# Patient Record
Sex: Female | Born: 1935 | Race: White | Hispanic: No | Marital: Married | Smoking: Former smoker
Health system: Southern US, Community
[De-identification: ages and names within clinical notes are randomized; demographics above are authoritative.]

## PROBLEM LIST (undated history)

## (undated) ENCOUNTER — Emergency Department (HOSPITAL_COMMUNITY): Disposition: A | Discharge status: Home / Self Care | Payer: Self-pay

## (undated) DIAGNOSIS — E039 Hypothyroidism, unspecified: Secondary | ICD-10-CM

## (undated) DIAGNOSIS — Z9289 Personal history of other medical treatment: Secondary | ICD-10-CM

## (undated) DIAGNOSIS — I82409 Acute embolism and thrombosis of unspecified deep veins of unspecified lower extremity: Secondary | ICD-10-CM

## (undated) DIAGNOSIS — R42 Dizziness and giddiness: Secondary | ICD-10-CM

## (undated) DIAGNOSIS — M1990 Unspecified osteoarthritis, unspecified site: Secondary | ICD-10-CM

## (undated) DIAGNOSIS — H02403 Unspecified ptosis of bilateral eyelids: Secondary | ICD-10-CM

## (undated) DIAGNOSIS — Z9889 Other specified postprocedural states: Secondary | ICD-10-CM

## (undated) DIAGNOSIS — K219 Gastro-esophageal reflux disease without esophagitis: Secondary | ICD-10-CM

## (undated) DIAGNOSIS — R112 Nausea with vomiting, unspecified: Secondary | ICD-10-CM

## (undated) DIAGNOSIS — Z973 Presence of spectacles and contact lenses: Secondary | ICD-10-CM

## (undated) HISTORY — PX: SHOULDER SURGERY: SHX246

## (undated) HISTORY — PX: EYE SURGERY: SHX253

## (undated) HISTORY — PX: TONSILLECTOMY: SUR1361

## (undated) HISTORY — PX: KNEE SURGERY: SHX244

## (undated) HISTORY — PX: COLONOSCOPY: SHX174

## (undated) SURGERY — BLEPHAROPLASTY
Anesthesia: IV Sedation (MBSC Only) | Laterality: Bilateral

---

## 1984-04-04 HISTORY — PX: BACK SURGERY: SHX140

## 1984-04-04 HISTORY — PX: ABDOMINAL HYSTERECTOMY: SHX81

## 1997-09-24 ENCOUNTER — Ambulatory Visit (HOSPITAL_COMMUNITY): Admission: RE | Admit: 1997-09-24 | Discharge: 1997-09-24 | Payer: Self-pay | Admitting: Internal Medicine

## 1998-03-03 ENCOUNTER — Ambulatory Visit (HOSPITAL_COMMUNITY): Admission: RE | Admit: 1998-03-03 | Discharge: 1998-03-03 | Payer: Self-pay | Admitting: Internal Medicine

## 1998-04-04 HISTORY — PX: CARPAL TUNNEL RELEASE: SHX101

## 1999-02-17 ENCOUNTER — Ambulatory Visit (HOSPITAL_BASED_OUTPATIENT_CLINIC_OR_DEPARTMENT_OTHER): Admission: RE | Admit: 1999-02-17 | Discharge: 1999-02-17 | Payer: Self-pay | Admitting: Orthopedic Surgery

## 1999-03-09 ENCOUNTER — Encounter: Payer: Self-pay | Admitting: Internal Medicine

## 1999-03-09 ENCOUNTER — Ambulatory Visit (HOSPITAL_COMMUNITY): Admission: RE | Admit: 1999-03-09 | Discharge: 1999-03-09 | Payer: Self-pay | Admitting: Internal Medicine

## 2000-03-20 ENCOUNTER — Encounter: Payer: Self-pay | Admitting: Internal Medicine

## 2000-03-20 ENCOUNTER — Ambulatory Visit (HOSPITAL_COMMUNITY): Admission: RE | Admit: 2000-03-20 | Discharge: 2000-03-20 | Payer: Self-pay | Admitting: Internal Medicine

## 2000-06-28 ENCOUNTER — Encounter: Admission: RE | Admit: 2000-06-28 | Discharge: 2000-06-28 | Payer: Self-pay | Admitting: Internal Medicine

## 2000-06-28 ENCOUNTER — Encounter: Payer: Self-pay | Admitting: Internal Medicine

## 2000-08-11 ENCOUNTER — Encounter: Admission: RE | Admit: 2000-08-11 | Discharge: 2000-08-11 | Payer: Self-pay | Admitting: Internal Medicine

## 2000-08-11 ENCOUNTER — Encounter: Payer: Self-pay | Admitting: Internal Medicine

## 2000-08-24 ENCOUNTER — Ambulatory Visit (HOSPITAL_COMMUNITY): Admission: RE | Admit: 2000-08-24 | Discharge: 2000-08-24 | Payer: Self-pay | Admitting: Internal Medicine

## 2000-09-13 ENCOUNTER — Ambulatory Visit (HOSPITAL_COMMUNITY): Admission: RE | Admit: 2000-09-13 | Discharge: 2000-09-13 | Payer: Self-pay | Admitting: Gastroenterology

## 2001-01-05 ENCOUNTER — Encounter: Payer: Self-pay | Admitting: Internal Medicine

## 2001-01-05 ENCOUNTER — Encounter: Admission: RE | Admit: 2001-01-05 | Discharge: 2001-01-05 | Payer: Self-pay | Admitting: Internal Medicine

## 2001-03-22 ENCOUNTER — Encounter: Payer: Self-pay | Admitting: Internal Medicine

## 2001-03-22 ENCOUNTER — Ambulatory Visit (HOSPITAL_COMMUNITY): Admission: RE | Admit: 2001-03-22 | Discharge: 2001-03-22 | Payer: Self-pay | Admitting: Internal Medicine

## 2002-03-25 ENCOUNTER — Ambulatory Visit (HOSPITAL_COMMUNITY): Admission: RE | Admit: 2002-03-25 | Discharge: 2002-03-25 | Payer: Self-pay | Admitting: Internal Medicine

## 2002-03-25 ENCOUNTER — Encounter: Payer: Self-pay | Admitting: Internal Medicine

## 2003-03-27 ENCOUNTER — Encounter: Admission: RE | Admit: 2003-03-27 | Discharge: 2003-03-27 | Payer: Self-pay | Admitting: Internal Medicine

## 2004-04-16 ENCOUNTER — Encounter: Admission: RE | Admit: 2004-04-16 | Discharge: 2004-04-16 | Payer: Self-pay | Admitting: Internal Medicine

## 2005-04-04 HISTORY — PX: CYSTOCELE REPAIR: SHX163

## 2005-04-19 ENCOUNTER — Encounter: Admission: RE | Admit: 2005-04-19 | Discharge: 2005-04-19 | Payer: Self-pay | Admitting: Internal Medicine

## 2005-07-22 ENCOUNTER — Ambulatory Visit (HOSPITAL_BASED_OUTPATIENT_CLINIC_OR_DEPARTMENT_OTHER): Admission: RE | Admit: 2005-07-22 | Discharge: 2005-07-22 | Payer: Self-pay | Admitting: Urology

## 2005-08-30 ENCOUNTER — Encounter: Admission: RE | Admit: 2005-08-30 | Discharge: 2005-08-30 | Payer: Self-pay | Admitting: Orthopedic Surgery

## 2005-09-01 ENCOUNTER — Encounter: Admission: RE | Admit: 2005-09-01 | Discharge: 2005-09-01 | Payer: Self-pay | Admitting: Internal Medicine

## 2005-10-07 ENCOUNTER — Encounter: Admission: RE | Admit: 2005-10-07 | Discharge: 2005-10-07 | Payer: Self-pay | Admitting: Orthopedic Surgery

## 2005-11-04 ENCOUNTER — Encounter: Admission: RE | Admit: 2005-11-04 | Discharge: 2005-11-04 | Payer: Self-pay | Admitting: Orthopedic Surgery

## 2005-12-07 ENCOUNTER — Ambulatory Visit (HOSPITAL_COMMUNITY): Admission: RE | Admit: 2005-12-07 | Discharge: 2005-12-07 | Payer: Self-pay | Admitting: Gastroenterology

## 2006-04-27 ENCOUNTER — Encounter: Admission: RE | Admit: 2006-04-27 | Discharge: 2006-04-27 | Payer: Self-pay | Admitting: Internal Medicine

## 2006-09-01 ENCOUNTER — Encounter: Admission: RE | Admit: 2006-09-01 | Discharge: 2006-09-01 | Payer: Self-pay | Admitting: Internal Medicine

## 2007-04-30 ENCOUNTER — Encounter: Admission: RE | Admit: 2007-04-30 | Discharge: 2007-04-30 | Payer: Self-pay | Admitting: Internal Medicine

## 2007-12-27 ENCOUNTER — Encounter: Admission: RE | Admit: 2007-12-27 | Discharge: 2007-12-27 | Payer: Self-pay | Admitting: Internal Medicine

## 2008-04-30 ENCOUNTER — Encounter: Admission: RE | Admit: 2008-04-30 | Discharge: 2008-04-30 | Payer: Self-pay | Admitting: Internal Medicine

## 2009-04-04 HISTORY — PX: BREAST BIOPSY: SHX20

## 2009-05-05 ENCOUNTER — Encounter: Admission: RE | Admit: 2009-05-05 | Discharge: 2009-05-05 | Payer: Self-pay | Admitting: Internal Medicine

## 2009-05-12 ENCOUNTER — Encounter: Admission: RE | Admit: 2009-05-12 | Discharge: 2009-05-12 | Payer: Self-pay | Admitting: Internal Medicine

## 2009-05-26 ENCOUNTER — Encounter: Admission: RE | Admit: 2009-05-26 | Discharge: 2009-05-26 | Payer: Self-pay | Admitting: Internal Medicine

## 2009-11-06 ENCOUNTER — Encounter: Admission: RE | Admit: 2009-11-06 | Discharge: 2009-11-06 | Payer: Self-pay | Admitting: Orthopedic Surgery

## 2010-04-04 HISTORY — PX: CARPAL TUNNEL RELEASE: SHX101

## 2010-04-24 ENCOUNTER — Other Ambulatory Visit: Payer: Self-pay | Admitting: Internal Medicine

## 2010-04-24 DIAGNOSIS — Z1239 Encounter for other screening for malignant neoplasm of breast: Secondary | ICD-10-CM

## 2010-04-24 DIAGNOSIS — Z1231 Encounter for screening mammogram for malignant neoplasm of breast: Secondary | ICD-10-CM

## 2010-05-12 ENCOUNTER — Ambulatory Visit
Admission: RE | Admit: 2010-05-12 | Discharge: 2010-05-12 | Disposition: A | Discharge status: Home / Self Care | Payer: MEDICARE | Source: Ambulatory Visit | Attending: Internal Medicine | Admitting: Internal Medicine

## 2010-05-12 DIAGNOSIS — Z1231 Encounter for screening mammogram for malignant neoplasm of breast: Secondary | ICD-10-CM

## 2010-06-18 ENCOUNTER — Inpatient Hospital Stay (INDEPENDENT_AMBULATORY_CARE_PROVIDER_SITE_OTHER)
Admission: RE | Admit: 2010-06-18 | Discharge: 2010-06-18 | Disposition: A | Discharge status: Home / Self Care | Payer: MEDICARE | Source: Ambulatory Visit | Attending: Family Medicine | Admitting: Family Medicine

## 2010-06-18 ENCOUNTER — Ambulatory Visit (INDEPENDENT_AMBULATORY_CARE_PROVIDER_SITE_OTHER): Payer: MEDICARE

## 2010-06-18 DIAGNOSIS — IMO0002 Reserved for concepts with insufficient information to code with codable children: Secondary | ICD-10-CM

## 2010-06-18 DIAGNOSIS — S63509A Unspecified sprain of unspecified wrist, initial encounter: Secondary | ICD-10-CM
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
## 2010-08-20 NOTE — Op Note (Signed)
[place]. [facility]  Patient:    EVIE CROSTON                   MRN: [id number] Proc. Date: 02/17/99 Adm. Date:  [id number] Attending:  Ronne Binning CC:         Nicki Reaper, M.D. (2 copies)                           Operative Report  PREOPERATIVE DIAGNOSIS:  Carpal tunnel syndrome right hand.  Stenosing tenosynovitis right middle finger.  POSTOPERATIVE DIAGNOSIS:  Carpal tunnel syndrome right hand.  Stenosing tenosynovitis right middle finger.  OPERATION:  Carpal tunnel release, right hand.  Release stenosing tenosynovitis  right middle finger.  SURGEON:  Nicki Reaper, M.D.  ASSISTANT:  R.N.  ANESTHESIA:  Forearm-based IV regional.  ANESTHESIOLOGIST:  Maren Beach, M.D.  HISTORY:  The patient is an 77 year old female with a history of carpal tunnel syndrome.  EMG nerve conduction is positive, which has not responded to conservative treatment.  She is also having triggering of her right middle finger, which has not responded to conservative treatment and desires having this released also.  PROCEDURE:  The patient was brought to the operating room, where a forearm-based IV regional anesthetic was carried out without difficulty.  She was prepped and draped using Betadine scrubbing solution with the right arm free.  A longitudinal incision was made in the palm and carried down through subcutaneous tissue.  Bleeders were electrocauterized.  The palmar fascia was split.  The superficial palmar arch identified.  The flexor tendon to the ring and little finger were identified to the ulnar side of the median nerve.  The carpal retinaculum was incised with sharp dissection.  A right-angle and Sewall retractor were placed between skin and forearm fascia.  The fascia was released for approximately 3 cm proximal to the  wrist crease under direct vision.  The canal was explored and no further lesions were identified.  Motor branch  was noted to enter in her muscle.  The wound was  irrigated.  The skin was closed with interrupted 5 0 nylon sutures.  A separate  incision was then made over the A1 pulley of the middle finger, right hand, and  carried down through subcutaneous tissue.  Bleeders again were electrocauterized. Neurovascular structures identified and protected.  The dissection carried down to the A1 pulley, which was found to be thickened.  There was a moderate tenosynovitis.  A release was then performed on the radial aspect of the A1 pulley. A small incision was made in the A2 pulley.  The finger was placed through a full range of motion.  No further triggering was identified.  The wound was irrigated. The skin was closed with interrupted 5 0 nylon sutures.  A sterile compressive dressing and splint were applied.  The patient tolerated the procedure well and was taken to the recovery room for observation in satisfactory condition.  She is discharged home to return to the [facility] [place] in one week on Talwin NX and Septra DS. DD:  02/17/99 TD:  02/17/99 Job: [id number]

## 2010-08-20 NOTE — Op Note (Signed)
[name], [name]           ACCOUNT NO.:  [id number]   MEDICAL RECORD NO.:  [id number]          PATIENT TYPE:  AMB   LOCATION:  ENDO                         FACILITY:  [facility]   PHYSICIAN:  Anselmo Rod, M.D.  DATE OF BIRTH:  1932/11/06   DATE OF PROCEDURE:  12/07/2005  DATE OF DISCHARGE:                                 OPERATIVE REPORT   PROCEDURE PERFORMED:  Screening colonoscopy.   ENDOSCOPIST:  Anselmo Rod, M.D.   INSTRUMENT USED:  Olympus video colonoscope.   INDICATION FOR PROCEDURE:  A 77 year old white female undergoing a screening  colonoscopy due to the family history of colon cancer in her father and to  rule out colonic polyps, masses, etc.   PREPROCEDURE PREPARATION:  Informed consent was procured from the patient.  The patient was fasted for 4 hours prior to the procedure and prepped with  32 Osmoprep pills, 20 of which were given the night prior to the procedure  and 12 the morning of the procedure.  Risks and benefits of the procedure  including a 10% miss rate of cancer in polyps were discussed with the  patient previously.   PREPROCEDURE PHYSICAL:  VITAL SIGNS:  The patient had stable vital signs.  NECK:  Supple.  CHEST:  Clear to auscultation.  CARDIAC:  S1 and S2 regular.  ABDOMEN:  Soft with normal bowel sounds.   DESCRIPTION OF THE PROCEDURE:  The patient was placed in the left lateral  decubitus position and sedated with an additional 75 mcg of fentanyl and 6  mg of Versed in slow incremental doses.  Once the patient was adequately  sedate and maintained on low-flow oxygen and continuous cardiac monitoring,  the Olympus video colonoscope was advanced from the rectum to the cecum.  The appendiceal orifice and ileocecal valve were clearly visualized and  photographed.  Multiple washes were done.  No masses, polyps, erosions,  ulcerations or diverticula were seen.  Small internal hemorrhoids were  present on retroflexion in the rectum.  The  patient tolerated the procedure  well without complication.   IMPRESSION:  1.Normal colonoscopy with no masses, polyps, erosions or  diverticulosis seen.  2.Small internal hemorrhoids seen on retroflexion in the rectum.   RECOMMENDATIONS:  1.Continue a high-fiber diet with liberal fluid intake.  2.Repeat colonoscopy in the next 5 years, unless the patient develops any  abnormal symptoms in the interim, in which case he is to return back to the  office to see me immediately.  3.Outpatient followup as need arises in the future.      Anselmo Rod, M.D.  Electronically Signed     JNM/MEDQ  D:  12/07/2005  T:  12/07/2005  Job:  [id number]   cc:   Antony Madura, M.D.

## 2010-08-20 NOTE — Procedures (Signed)
[place]. [facility]  Patient:    [name], [name]                  MRN: [id number] Proc. Date: 09/13/00 Adm. Date:  [id number] Attending:  Charna Elizabeth                           Procedure Report  DATE OF BIRTH:  [birth year]  REFERRING PHYSICIAN:  PROCEDURE PERFORMED:  Colonoscopy.  ENDOSCOPIST:  Anselmo Rod, M.D.  INSTRUMENT USED:  Olympus video colonoscope.  INDICATIONS FOR PROCEDURE:  Personal history of polyps in a  77 year old white female, rule out recurrent polyps.  PREPROCEDURE PREPARATION:  Informed consent was procured from the patient. The patient was fasted for eight hours prior to the procedure and prepped with a bottle of magnesium citrate and a gallon of NuLytely the night prior to the procedure.  PREPROCEDURE PHYSICAL:  The patient had stable vital signs.  Neck supple. Chest clear to auscultation.  S1, S2 regular.  Abdomen soft with normal abdominal bowel sounds.  DESCRIPTION OF PROCEDURE:  The patient was placed in the left lateral decubitus position and sedated with 60 mcg of fentanyl and 7 mg of Versed intravenously.  Once the patient was adequately sedated and maintained on low-flow oxygen and continuous cardiac monitoring, the Olympus video colonoscope was advanced from the rectum to the cecum with difficulty secondary to a large amount of residual stool in the colon.  No masses, polyps, erosions or ulcerations were seen.  Small internal hemorrhoids were appreciated on retroflexion in the rectum.  The patient tolerated the procedure well without complication.  IMPRESSION: 1. Large amount of stool in the colon. 2. Small internal hemorrhoids seen. 3. No masses or polyps present. 4. Very small lesions could have been missed.  RECOMMENDATIONS: 1. Repeat colorectal cancer screening is recommended in the next five years    unless the patient were to develop any abnormal symptoms in the  interim.DD: 09/13/00 TD:  09/13/00 Job: [id number]

## 2010-12-17 ENCOUNTER — Ambulatory Visit (HOSPITAL_BASED_OUTPATIENT_CLINIC_OR_DEPARTMENT_OTHER)
Admission: RE | Admit: 2010-12-17 | Discharge: 2010-12-17 | Disposition: A | Discharge status: Home / Self Care | Payer: Medicare Other | Source: Ambulatory Visit | Attending: Orthopedic Surgery | Admitting: Orthopedic Surgery

## 2010-12-17 DIAGNOSIS — G5600 Carpal tunnel syndrome, unspecified upper limb: Secondary | ICD-10-CM | POA: Insufficient documentation

## 2010-12-17 DIAGNOSIS — K219 Gastro-esophageal reflux disease without esophagitis: Secondary | ICD-10-CM | POA: Insufficient documentation

## 2010-12-17 DIAGNOSIS — Z01812 Encounter for preprocedural laboratory examination: Secondary | ICD-10-CM | POA: Insufficient documentation

## 2010-12-17 DIAGNOSIS — M65839 Other synovitis and tenosynovitis, unspecified forearm: Secondary | ICD-10-CM | POA: Insufficient documentation

## 2010-12-17 LAB — POCT HEMOGLOBIN-HEMACUE: Hemoglobin: 12.2 g/dL (ref 12.0–15.0)

## 2010-12-23 NOTE — Op Note (Signed)
NAMEALANNIE, AMODIO           ACCOUNT NO.:  [id number]  MEDICAL RECORD NO.:  [id number]  LOCATION:                                 FACILITY:  PHYSICIAN:  Katy Fitch. Anthea Udovich, M.D. DATE OF BIRTH:  [birth year]  DATE OF PROCEDURE:  12/17/2010 DATE OF DISCHARGE:                              OPERATIVE REPORT   PREOPERATIVE DIAGNOSES: 1. Chronic left carpal tunnel syndrome. 2. 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  POSTOPERATIVE DIAGNOSES: 1. Chronic left carpal tunnel syndrome. 2. 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  OPERATION: 1. Release of left transverse carpal ligament. 2. Release of left ring finger A1 pulley.  OPERATING SURGEON:  Katy Fitch. Dandria Griego, MD  ASSISTANT:  Marveen Reeks Dasnoit, PA-C  ANESTHESIA:  General by LMA.  SUPERVISING ANESTHESIOLOGIST:  Zenon Mayo, MD  INDICATIONS:  [name] is a 77 year old woman referred through the courtesy of Dr. Burton Apley for evaluation and management of hand symptoms.  Clinical examination revealed signs of carpal tunnel syndrome and stenosing tenosynovitis of the left ring finger.  Due to a failure to respond to nonoperative measures, she is now brought to the operating room anticipating release of her left transverse carpal ligament and release of her left ring finger A1 pulley under general by LMA anesthesia.  Questions regarding the anticipated procedures were invited and answered in detail.  PROCEDURE:  Ciarah Peace was brought to room 1 of the [facility] and placed in supine position upon the operating table.  Following induction of general anesthesia by LMA technique under Dr. Jarrett Ables direct supervision, the left arm was prepped with Betadine soap and solution and sterilely draped.  A pneumatic tourniquet was applied to the proximal left brachium.  Following exsanguination of the left arm with an Esmarch bandage, the arterial  tourniquet was inflated to 230 mmHg.  Procedure commenced with routine surgical time-out.  A short incision was then fashioned in the line of the ring finger and the palm.  Subcutaneous tissues were carefully divided revealing the palmar fascia.  This was split longitudinally to reveal the common sensory branches of the median nerve.  These were followed back to the transverse carpal ligament where the median nerve was gently isolated from the transverse carpal ligament with a Penfield 4 Engineer, structural.  Once the pathway was cleared superficial and deep to the ligament, the ligament was released with scissors along its ulnar border.  This widely opened the carpal canal.  Bleeding points along the margin of the released ligament were electrocauterized with bipolar current, followed by repair of the skin with intradermal 3-0 Prolene suture.  Attention was then directed to the left ring finger flexor sheath.  The palpably thickened A1 pulley was identified.  A short oblique incision was fashioned directly over the pulley.  The palmar fascia was released, followed by isolation of the pulley.  The pulley was split with scalpel and scissors.  The tendons were otherwise unimpeded.  Free range of motion of the fingers was recovered.  The palm wound was then repaired with intradermal 3-0 Prolene suture.  Lidocaine 2% was then infiltrated around the median nerve in the distal forearm and along the margins  of the wound for postoperative analgesia.  For aftercare, Ms. Virrueta is provided prescriptions for Vicodin 5 mg one p.o. q.4-6 h. p.r.n. pain, 20 tablets without refill.     Katy Fitch Safiyah Cisney, M.D.     RVS/MEDQ  D:  12/17/2010  T:  12/17/2010  Job:  [id number]  cc:   Antony Madura, M.D.  Electronically Signed by Josephine Igo M.D. on 12/23/2010 11:39:15 AM

## 2011-04-20 ENCOUNTER — Other Ambulatory Visit: Payer: Self-pay | Admitting: Internal Medicine

## 2011-04-20 DIAGNOSIS — Z1231 Encounter for screening mammogram for malignant neoplasm of breast: Secondary | ICD-10-CM

## 2011-05-11 ENCOUNTER — Other Ambulatory Visit: Payer: Self-pay | Admitting: Internal Medicine

## 2011-05-11 ENCOUNTER — Ambulatory Visit
Admission: RE | Admit: 2011-05-11 | Discharge: 2011-05-11 | Disposition: A | Discharge status: Home / Self Care | Payer: Medicare Other | Source: Ambulatory Visit | Attending: Internal Medicine | Admitting: Internal Medicine

## 2011-05-11 DIAGNOSIS — R05 Cough: Secondary | ICD-10-CM

## 2011-05-11 DIAGNOSIS — R059 Cough, unspecified: Secondary | ICD-10-CM

## 2011-05-16 ENCOUNTER — Ambulatory Visit
Admission: RE | Admit: 2011-05-16 | Discharge: 2011-05-16 | Disposition: A | Discharge status: Home / Self Care | Payer: Self-pay | Source: Ambulatory Visit | Attending: Internal Medicine | Admitting: Internal Medicine

## 2011-05-16 DIAGNOSIS — Z1231 Encounter for screening mammogram for malignant neoplasm of breast: Secondary | ICD-10-CM

## 2011-08-01 ENCOUNTER — Other Ambulatory Visit: Payer: Self-pay | Admitting: Internal Medicine

## 2011-08-01 ENCOUNTER — Ambulatory Visit
Admission: RE | Admit: 2011-08-01 | Discharge: 2011-08-01 | Disposition: A | Discharge status: Home / Self Care | Payer: Medicare Other | Source: Ambulatory Visit | Attending: Internal Medicine | Admitting: Internal Medicine

## 2011-08-01 DIAGNOSIS — M542 Cervicalgia: Secondary | ICD-10-CM

## 2012-02-22 ENCOUNTER — Encounter (HOSPITAL_COMMUNITY): Payer: Self-pay

## 2012-02-22 ENCOUNTER — Emergency Department (HOSPITAL_COMMUNITY)
Admission: EM | Admit: 2012-02-22 | Discharge: 2012-02-22 | Disposition: A | Discharge status: Home / Self Care | Payer: No Typology Code available for payment source | Attending: Emergency Medicine | Admitting: Emergency Medicine

## 2012-02-22 ENCOUNTER — Emergency Department (HOSPITAL_COMMUNITY): Payer: No Typology Code available for payment source

## 2012-02-22 DIAGNOSIS — Y92410 Unspecified street and highway as the place of occurrence of the external cause: Secondary | ICD-10-CM | POA: Insufficient documentation

## 2012-02-22 DIAGNOSIS — T148XXA Other injury of unspecified body region, initial encounter: Secondary | ICD-10-CM

## 2012-02-22 DIAGNOSIS — Z87891 Personal history of nicotine dependence: Secondary | ICD-10-CM | POA: Insufficient documentation

## 2012-02-22 DIAGNOSIS — Y9389 Activity, other specified: Secondary | ICD-10-CM | POA: Insufficient documentation

## 2012-02-22 DIAGNOSIS — S20219A Contusion of unspecified front wall of thorax, initial encounter: Secondary | ICD-10-CM
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
## 2012-02-22 NOTE — ED Provider Notes (Signed)
Medical screening examination/treatment/procedure(s) were performed by non-physician practitioner and as supervising physician I was immediately available for consultation/collaboration.  Ethelda Chick, MD 02/22/12 2052
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
## 2012-02-22 NOTE — ED Notes (Signed)
Patient returned from X-ray 

## 2012-02-22 NOTE — ED Notes (Signed)
Ice pack applied to RLE.

## 2012-04-16 ENCOUNTER — Other Ambulatory Visit: Payer: Self-pay | Admitting: Internal Medicine

## 2012-04-16 DIAGNOSIS — Z1231 Encounter for screening mammogram for malignant neoplasm of breast: Secondary | ICD-10-CM

## 2012-05-16 ENCOUNTER — Other Ambulatory Visit: Payer: Self-pay | Admitting: Orthopedic Surgery

## 2012-05-16 DIAGNOSIS — M25511 Pain in right shoulder: Secondary | ICD-10-CM

## 2012-05-17 ENCOUNTER — Ambulatory Visit: Payer: Medicare Other

## 2012-05-24 ENCOUNTER — Ambulatory Visit
Admission: RE | Admit: 2012-05-24 | Discharge: 2012-05-24 | Disposition: A | Discharge status: Home / Self Care | Payer: Medicare Other | Source: Ambulatory Visit | Attending: Orthopedic Surgery | Admitting: Orthopedic Surgery

## 2012-05-24 DIAGNOSIS — M25511 Pain in right shoulder: Secondary | ICD-10-CM

## 2012-06-12 ENCOUNTER — Ambulatory Visit
Admission: RE | Admit: 2012-06-12 | Discharge: 2012-06-12 | Disposition: A | Discharge status: Home / Self Care | Payer: Self-pay | Source: Ambulatory Visit | Attending: Internal Medicine | Admitting: Internal Medicine

## 2012-06-12 DIAGNOSIS — Z1231 Encounter for screening mammogram for malignant neoplasm of breast: Secondary | ICD-10-CM

## 2012-06-14 ENCOUNTER — Ambulatory Visit: Payer: Medicare Other | Admitting: Physical Therapy

## 2012-06-14 ENCOUNTER — Ambulatory Visit: Payer: Medicare Other | Attending: Orthopedic Surgery | Admitting: Physical Therapy

## 2012-06-14 DIAGNOSIS — IMO0001 Reserved for inherently not codable concepts without codable children: Secondary | ICD-10-CM | POA: Insufficient documentation

## 2012-06-14 DIAGNOSIS — M25619 Stiffness of unspecified shoulder, not elsewhere classified: Secondary | ICD-10-CM | POA: Insufficient documentation

## 2012-06-14 DIAGNOSIS — M25519 Pain in unspecified shoulder: Secondary | ICD-10-CM | POA: Insufficient documentation

## 2012-06-18 ENCOUNTER — Ambulatory Visit: Payer: Medicare Other | Admitting: Physical Therapy

## 2012-06-20 ENCOUNTER — Ambulatory Visit: Payer: Medicare Other | Admitting: Physical Therapy

## 2012-06-25 ENCOUNTER — Ambulatory Visit: Payer: Medicare Other | Admitting: Physical Therapy

## 2012-06-27 ENCOUNTER — Ambulatory Visit: Payer: Medicare Other | Admitting: Physical Therapy

## 2012-07-02 ENCOUNTER — Ambulatory Visit: Payer: Medicare Other | Admitting: Physical Therapy

## 2012-07-03 ENCOUNTER — Ambulatory Visit: Payer: Medicare Other | Admitting: Physical Therapy

## 2012-07-04 ENCOUNTER — Ambulatory Visit: Payer: Medicare Other | Attending: Orthopedic Surgery | Admitting: Physical Therapy

## 2012-07-04 ENCOUNTER — Ambulatory Visit: Payer: Medicare Other | Admitting: Physical Therapy

## 2012-07-04 DIAGNOSIS — M25519 Pain in unspecified shoulder: Secondary | ICD-10-CM | POA: Insufficient documentation

## 2012-07-04 DIAGNOSIS — IMO0001 Reserved for inherently not codable concepts without codable children: Secondary | ICD-10-CM | POA: Insufficient documentation

## 2012-07-04 DIAGNOSIS — M25619 Stiffness of unspecified shoulder, not elsewhere classified: Secondary | ICD-10-CM | POA: Insufficient documentation

## 2012-07-05 ENCOUNTER — Ambulatory Visit: Payer: Medicare Other | Admitting: Physical Therapy

## 2012-07-10 ENCOUNTER — Ambulatory Visit: Payer: Medicare Other | Admitting: Physical Therapy

## 2012-07-12 ENCOUNTER — Ambulatory Visit: Payer: Medicare Other | Admitting: Physical Therapy

## 2012-07-17 ENCOUNTER — Ambulatory Visit: Payer: Medicare Other | Admitting: Physical Therapy

## 2012-07-23 ENCOUNTER — Ambulatory Visit: Payer: Medicare Other | Admitting: Physical Therapy

## 2012-07-25 ENCOUNTER — Ambulatory Visit: Payer: Medicare Other | Admitting: Physical Therapy

## 2012-07-31 ENCOUNTER — Ambulatory Visit: Payer: Medicare Other | Admitting: Physical Therapy

## 2012-08-02 ENCOUNTER — Ambulatory Visit: Payer: Medicare Other | Attending: Orthopedic Surgery | Admitting: Physical Therapy

## 2012-08-02 DIAGNOSIS — Z96659 Presence of unspecified artificial knee joint: Secondary | ICD-10-CM | POA: Insufficient documentation

## 2012-08-02 DIAGNOSIS — M25519 Pain in unspecified shoulder: Secondary | ICD-10-CM | POA: Insufficient documentation

## 2012-08-02 DIAGNOSIS — M25619 Stiffness of unspecified shoulder, not elsewhere classified: Secondary | ICD-10-CM | POA: Insufficient documentation

## 2012-08-02 DIAGNOSIS — IMO0001 Reserved for inherently not codable concepts without codable children: Secondary | ICD-10-CM | POA: Insufficient documentation

## 2012-08-06 ENCOUNTER — Ambulatory Visit: Payer: Medicare Other | Admitting: Physical Therapy

## 2012-08-07 ENCOUNTER — Ambulatory Visit: Payer: Medicare Other | Admitting: Physical Therapy

## 2012-08-10 ENCOUNTER — Ambulatory Visit: Payer: Medicare Other | Admitting: Physical Therapy

## 2012-08-13 ENCOUNTER — Ambulatory Visit: Payer: Medicare Other | Admitting: Physical Therapy

## 2012-08-14 ENCOUNTER — Ambulatory Visit: Payer: Medicare Other | Admitting: Physical Therapy

## 2012-08-16 ENCOUNTER — Ambulatory Visit: Payer: Medicare Other | Admitting: Physical Therapy

## 2012-11-06 ENCOUNTER — Other Ambulatory Visit: Payer: Self-pay | Admitting: Internal Medicine

## 2012-11-06 DIAGNOSIS — R109 Unspecified abdominal pain: Secondary | ICD-10-CM

## 2012-11-13 ENCOUNTER — Ambulatory Visit
Admission: RE | Admit: 2012-11-13 | Discharge: 2012-11-13 | Disposition: A | Discharge status: Home / Self Care | Payer: Medicare Other | Source: Ambulatory Visit | Attending: Internal Medicine | Admitting: Internal Medicine

## 2012-11-13 DIAGNOSIS — R109 Unspecified abdominal pain: Secondary | ICD-10-CM

## 2012-11-13 DIAGNOSIS — R1032 Left lower quadrant pain: Secondary | ICD-10-CM

## 2013-01-07 ENCOUNTER — Other Ambulatory Visit (HOSPITAL_COMMUNITY): Payer: Self-pay | Admitting: Internal Medicine

## 2013-01-07 DIAGNOSIS — R609 Edema, unspecified: Secondary | ICD-10-CM

## 2013-01-08 ENCOUNTER — Ambulatory Visit (HOSPITAL_COMMUNITY)
Admission: RE | Admit: 2013-01-08 | Discharge: 2013-01-08 | Disposition: A | Discharge status: Home / Self Care | Payer: Medicare Other | Source: Ambulatory Visit | Attending: Internal Medicine | Admitting: Internal Medicine

## 2013-01-08 DIAGNOSIS — R609 Edema, unspecified: Secondary | ICD-10-CM | POA: Insufficient documentation

## 2013-01-08 NOTE — Progress Notes (Signed)
*  PRELIMINARY RESULTS* Vascular Ultrasound Left lower extremity venous duplex has been completed.  Preliminary findings: no evidence of DVT or baker's cyst.  Called report to [facility].  Farrel Demark, RDMS, RVT  01/08/2013, 11:07 AM

## 2013-01-29 ENCOUNTER — Ambulatory Visit
Admission: RE | Admit: 2013-01-29 | Discharge: 2013-01-29 | Disposition: A | Discharge status: Home / Self Care | Payer: Medicare Other | Source: Ambulatory Visit | Attending: Internal Medicine | Admitting: Internal Medicine

## 2013-01-29 ENCOUNTER — Other Ambulatory Visit: Payer: Self-pay | Admitting: Internal Medicine

## 2013-01-29 DIAGNOSIS — M25572 Pain in left ankle and joints of left foot: Secondary | ICD-10-CM

## 2013-05-09 ENCOUNTER — Other Ambulatory Visit: Payer: Self-pay

## 2013-05-09 DIAGNOSIS — Z1231 Encounter for screening mammogram for malignant neoplasm of breast: Secondary | ICD-10-CM

## 2013-06-13 ENCOUNTER — Ambulatory Visit
Admission: RE | Admit: 2013-06-13 | Discharge: 2013-06-13 | Disposition: A | Discharge status: Home / Self Care | Payer: Medicare Other | Source: Ambulatory Visit

## 2013-06-13 DIAGNOSIS — Z1231 Encounter for screening mammogram for malignant neoplasm of breast: Secondary | ICD-10-CM

## 2013-08-24 ENCOUNTER — Encounter (HOSPITAL_COMMUNITY): Payer: Self-pay | Admitting: Emergency Medicine

## 2013-08-24 ENCOUNTER — Emergency Department (HOSPITAL_COMMUNITY)
Admission: EM | Admit: 2013-08-24 | Discharge: 2013-08-24 | Disposition: A | Discharge status: Home / Self Care | Payer: Medicare Other | Source: Home / Self Care

## 2013-08-24 ENCOUNTER — Emergency Department (INDEPENDENT_AMBULATORY_CARE_PROVIDER_SITE_OTHER): Payer: Medicare Other

## 2013-08-24 DIAGNOSIS — W19XXXA Unspecified fall, initial encounter: Secondary | ICD-10-CM

## 2013-08-24 DIAGNOSIS — Y92009 Unspecified place in unspecified non-institutional (private) residence as the place of occurrence of the external cause: Secondary | ICD-10-CM

## 2013-08-24 DIAGNOSIS — S62609A Fracture of unspecified phalanx of unspecified finger, initial encounter for closed fracture: Secondary | ICD-10-CM

## 2013-08-24 DIAGNOSIS — S6980XA Other specified injuries of unspecified wrist, hand and finger(s), initial encounter: Secondary | ICD-10-CM

## 2013-08-24 DIAGNOSIS — IMO0001 Reserved for inherently not codable concepts without codable children: Secondary | ICD-10-CM

## 2013-08-24 DIAGNOSIS — S6990XA Unspecified injury of unspecified wrist, hand and finger(s), initial encounter: Secondary | ICD-10-CM

## 2013-08-24 MED ORDER — HYDROCODONE-ACETAMINOPHEN 5-325 MG PO TABS: 1.0000 | ORAL_TABLET | ORAL | Status: DC | PRN

## 2013-08-24 NOTE — Discharge Instructions (Signed)
Cast or Splint Care Casts and splints support injured limbs and keep bones from moving while they heal. It is important to care for your cast or splint at home.  HOME CARE INSTRUCTIONS  Keep the cast or splint uncovered during the drying period. It can take 24 to 48 hours to dry if it is made of plaster. A fiberglass cast will dry in less than 1 hour.  Do not rest the cast on anything harder than a pillow for the first 24 hours.  Do not put weight on your injured limb or apply pressure to the cast until your health care provider gives you permission.  Keep the cast or splint dry. Wet casts or splints can lose their shape and may not support the limb as well. A wet cast that has lost its shape can also create harmful pressure on your skin when it dries. Also, wet skin can become infected.  Cover the cast or splint with a plastic bag when bathing or when out in the rain or snow. If the cast is on the trunk of the body, take sponge baths until the cast is removed.  If your cast does become wet, dry it with a towel or a blow dryer on the cool setting only.  Keep your cast or splint clean. Soiled casts may be wiped with a moistened cloth.  Do not place any hard or soft foreign objects under your cast or splint, such as cotton, toilet paper, lotion, or powder.  Do not try to scratch the skin under the cast with any object. The object could get stuck inside the cast. Also, scratching could lead to an infection. If itching is a problem, use a blow dryer on a cool setting to relieve discomfort.  Do not trim or cut your cast or remove padding from inside of it.  Exercise all joints next to the injury that are not immobilized by the cast or splint. For example, if you have a long leg cast, exercise the hip joint and toes. If you have an arm cast or splint, exercise the shoulder, elbow, thumb, and fingers.  Elevate your injured arm or leg on 1 or 2 pillows for the first 1 to 3 days to decrease  swelling and pain.It is best if you can comfortably elevate your cast so it is higher than your heart. SEEK MEDICAL CARE IF:   Your cast or splint cracks.  Your cast or splint is too tight or too loose.  You have unbearable itching inside the cast.  Your cast becomes wet or develops a soft spot or area.  You have a bad smell coming from inside your cast.  You get an object stuck under your cast.  Your skin around the cast becomes red or raw.  You have new pain or worsening pain after the cast has been applied. SEEK IMMEDIATE MEDICAL CARE IF:   You have fluid leaking through the cast.  You are unable to move your fingers or toes.  You have discolored (blue or white), cool, painful, or very swollen fingers or toes beyond the cast.  You have tingling or numbness around the injured area.  You have severe pain or pressure under the cast.  You have any difficulty with your breathing or have shortness of breath.  You have chest pain. Document Released: 03/18/2000 Document Revised: 01/09/2013 Document Reviewed: 09/27/2012 [facility] Patient Information 2014 [place].  Finger Fracture Fractures of fingers are breaks in the bones of the fingers. There  are many types of fractures. There are different ways of treating these fractures. Your health care provider will discuss the best way to treat your fracture. CAUSES Traumatic injury is the main cause of broken fingers. These include:  Injuries while playing sports.  Workplace injuries.  Falls. RISK FACTORS Activities that can increase your risk of finger fractures include:  Sports.  Workplace activities that involve machinery.  A condition called osteoporosis, which can make your bones less dense and cause them to fracture more easily. SIGNS AND SYMPTOMS The main symptoms of a broken finger are pain and swelling within 15 minutes after the injury. Other symptoms include:  Bruising of your finger.  Stiffness of  your finger.  Numbness of your finger.  Exposed bones (compound fracture) if the fracture is severe. DIAGNOSIS  The best way to diagnose a broken bone is with X-ray imaging. Additionally, your health care provider will use this X-ray image to evaluate the position of the broken finger bones.  TREATMENT  Finger fractures can be treated with:   Nonreduction This means the bones are in place. The finger is splinted without changing the positions of the bone pieces. The splint is usually left on for about a week to 10 days. This will depend on your fracture and what your health care provider thinks.  Closed reduction The bones are put back into position without using surgery. The finger is then splinted.  Open reduction and internal fixation The fracture site is opened. Then the bone pieces are fixed into place with pins or some type of hardware. This is seldom required. It depends on the severity of the fracture. HOME CARE INSTRUCTIONS   Follow your health care provider's instructions regarding activities, exercises, and physical therapy.  Only take over-the-counter or prescription medicines for pain, discomfort, or fever as directed by your health care provider. SEEK MEDICAL CARE IF: You have pain or swelling that limits the motion or use of your fingers. SEEK IMMEDIATE MEDICAL CARE IF:  Your finger becomes numb. MAKE SURE YOU:   Understand these instructions.  Will watch your condition.  Will get help right away if you are not doing well or get worse. Document Released: 07/03/2000 Document Revised: 01/09/2013 Document Reviewed: 10/31/2012 [facility] Patient Information 2014 [place], [place].

## 2013-08-24 NOTE — ED Provider Notes (Signed)
Medical screening examination/treatment/procedure(s) were performed by resident physician or non-physician practitioner and as supervising physician I was immediately available for consultation/collaboration.   Pauline Good MD.   Billy Fischer, MD 08/24/13 2000

## 2013-08-24 NOTE — ED Notes (Signed)
Ortho has been paged. 

## 2013-08-24 NOTE — Progress Notes (Signed)
Orthopedic Tech Progress Note Patient Details:  [name] Jul 04, 1932 [id number]  Ortho Devices Type of Ortho Device: Ace wrap;Ulna gutter splint;Finger splint Ortho Device/Splint Location: lue Ortho Device/Splint Interventions: Application   Derick Seminara 08/24/2013, 3:14 PM

## 2013-08-24 NOTE — ED Provider Notes (Signed)
CSN: [id number]     Arrival date & time 08/24/13  1016 History   First MD Initiated Contact with Patient 08/24/13 1215     Chief Complaint  Patient presents with  . Hand Injury   (Consider location/radiation/quality/duration/timing/severity/associated sxs/prior Treatment) HPI Comments: As above, fell yesterday onto the L hand. Developed swelling yesterday to the hand and 4th and 5th digits. She applied ice and splinted the 2 digits together last PM, caused some pain. Denies other injuries.   History reviewed. No pertinent past medical history. Past Surgical History  Procedure Laterality Date  . Back surgery    . Knee surgery    . Shoulder surgery     No family history on file. History  Substance Use Topics  . Smoking status: Former Research scientist (life sciences)  . Smokeless tobacco: Never Used  . Alcohol Use: No   OB History   Grav Para Term Preterm Abortions TAB SAB Ect Mult Living                 Review of Systems  Constitutional: Negative for fever, chills and activity change.  HENT: Negative.   Respiratory: Negative.   Cardiovascular: Negative.   Musculoskeletal: Negative for gait problem, neck pain and neck stiffness.       As per HPI  Skin: Positive for color change. Negative for pallor, rash and wound.  Neurological: Negative.     Allergies  Keflex  Home Medications   Prior to Admission medications   Medication Sig Start Date End Date Taking? Authorizing Provider  calcium carbonate (OS-CAL) 600 MG TABS Take 600 mg by mouth daily.    Historical Provider, MD  cholecalciferol (VITAMIN D) 1000 UNITS tablet Take 1,000 Units by mouth daily.    Historical Provider, MD  levothyroxine (SYNTHROID, LEVOTHROID) 50 MCG tablet Take 50 mcg by mouth daily.    Historical Provider, MD  Multiple Vitamin (MULTIVITAMIN WITH MINERALS) TABS Take 1 tablet by mouth daily.    Historical Provider, MD  naproxen sodium (ANAPROX) 220 MG tablet Take 440 mg by mouth every morning.    Historical Provider, MD    There were no vitals taken for this visit. Physical Exam  Nursing note and vitals reviewed. Constitutional: She is oriented to person, place, and time. She appears well-developed and well-nourished. No distress.  HENT:  Head: Normocephalic and atraumatic.  Eyes: EOM are normal. Pupils are equal, round, and reactive to light.  Neck: Normal range of motion. Neck supple.  Pulmonary/Chest: She is in respiratory distress.  Musculoskeletal:  Swelling and tenderness to the L hand ulnar aspect, 5th digit, 4th digit. The DIP of the 4th digit is bruised and swollen with inability to extend.  Distal cap refill is brisk all digits. Radial pulse 2 +. Mild tenderness to wrist, Full ROM.   Neurological: She is alert and oriented to person, place, and time. No cranial nerve deficit.  Skin: Skin is warm and dry.  Psychiatric: She has a normal mood and affect.    ED Course  Procedures (including critical care time) Labs Review Labs Reviewed - No data to display  Imaging Review Dg Hand Complete Left  08/24/2013   CLINICAL DATA:  fall, pain  EXAM: LEFT HAND - COMPLETE 3+ VIEW  COMPARISON:  None.  FINDINGS: There is a comminuted fracture of the proximal phalanx little finger, with some override of fracture fragments and mild dorsal angulation of distal fracture fragment. . No definite extension to proximal or distal articular surfaces. Mild diffuse osteopenia. Carpal rows  intact. Chondrocalcinosis in the TFCC. Degenerative spurring in the DIP long finger.  IMPRESSION: 1. Comminuted fracture, proximal phalanx left little finger.   Electronically Signed   By: Arne Cleveland M.D.   On: 08/24/2013 12:41     MDM   1. Finger fracture, left   2. Injury of fourth finger of left hand     Contcct with Dr. Fredna Dow 1310h.  Splint, ulnar gutter and 4th digit in extension. Call Tuesday for appt.  RICE Norco for pain prn.     Janne Napoleon, NP 08/24/13 1404 Time in dept includes radiology reading report ,  waiting on hand surgeon to call and splint application.  Janne Napoleon, NP 08/24/13 [phone]

## 2013-08-24 NOTE — ED Notes (Signed)
Pt c/o left hand inj onset yest pm Reports she lost her balance while working in the [employer] onto grass; landed on left hand Denies head inj/LOC Alert w/no signs of acute distress.

## 2013-08-24 NOTE — ED Notes (Signed)
Waiting for ortho

## 2013-08-27 ENCOUNTER — Other Ambulatory Visit: Payer: Self-pay | Admitting: Orthopedic Surgery

## 2013-08-28 ENCOUNTER — Encounter (HOSPITAL_BASED_OUTPATIENT_CLINIC_OR_DEPARTMENT_OTHER): Payer: Self-pay | Admitting: *Deleted

## 2013-08-30 ENCOUNTER — Ambulatory Visit (HOSPITAL_BASED_OUTPATIENT_CLINIC_OR_DEPARTMENT_OTHER)
Admission: RE | Admit: 2013-08-30 | Discharge: 2013-08-30 | Disposition: A | Discharge status: Home / Self Care | Payer: Medicare Other | Source: Ambulatory Visit | Attending: Orthopedic Surgery | Admitting: Orthopedic Surgery

## 2013-08-30 ENCOUNTER — Encounter (HOSPITAL_BASED_OUTPATIENT_CLINIC_OR_DEPARTMENT_OTHER): Payer: Self-pay | Admitting: Anesthesiology

## 2013-08-30 ENCOUNTER — Encounter (HOSPITAL_BASED_OUTPATIENT_CLINIC_OR_DEPARTMENT_OTHER)
Admission: RE | Disposition: A | Discharge status: Home / Self Care | Payer: Self-pay | Source: Ambulatory Visit | Attending: Orthopedic Surgery

## 2013-08-30 ENCOUNTER — Encounter (HOSPITAL_BASED_OUTPATIENT_CLINIC_OR_DEPARTMENT_OTHER): Payer: Medicare Other | Admitting: Anesthesiology

## 2013-08-30 ENCOUNTER — Ambulatory Visit (HOSPITAL_BASED_OUTPATIENT_CLINIC_OR_DEPARTMENT_OTHER): Payer: Medicare Other | Admitting: Anesthesiology

## 2013-08-30 DIAGNOSIS — W19XXXA Unspecified fall, initial encounter: Secondary | ICD-10-CM | POA: Insufficient documentation

## 2013-08-30 DIAGNOSIS — E039 Hypothyroidism, unspecified: Secondary | ICD-10-CM | POA: Insufficient documentation

## 2013-08-30 DIAGNOSIS — Z79899 Other long term (current) drug therapy: Secondary | ICD-10-CM | POA: Insufficient documentation

## 2013-08-30 DIAGNOSIS — Y929 Unspecified place or not applicable: Secondary | ICD-10-CM | POA: Insufficient documentation

## 2013-08-30 DIAGNOSIS — K219 Gastro-esophageal reflux disease without esophagitis: Secondary | ICD-10-CM | POA: Insufficient documentation

## 2013-08-30 DIAGNOSIS — IMO0002 Reserved for concepts with insufficient information to code with codable children: Secondary | ICD-10-CM | POA: Insufficient documentation

## 2013-08-30 DIAGNOSIS — M20019 Mallet finger of unspecified finger(s): Secondary | ICD-10-CM | POA: Insufficient documentation

## 2013-08-30 DIAGNOSIS — M129 Arthropathy, unspecified: Secondary | ICD-10-CM | POA: Insufficient documentation

## 2013-08-30 DIAGNOSIS — Z881 Allergy status to other antibiotic agents status: Secondary | ICD-10-CM | POA: Insufficient documentation

## 2013-08-30 DIAGNOSIS — Z87891 Personal history of nicotine dependence: Secondary | ICD-10-CM | POA: Insufficient documentation

## 2013-08-30 HISTORY — DX: Other specified postprocedural states: Z98.890

## 2013-08-30 HISTORY — DX: Unspecified osteoarthritis, unspecified site: M19.90

## 2013-08-30 HISTORY — PX: OPEN REDUCTION INTERNAL FIXATION (ORIF) PROXIMAL PHALANX: SHX6235

## 2013-08-30 HISTORY — DX: Hypothyroidism, unspecified: E03.9

## 2013-08-30 HISTORY — DX: Gastro-esophageal reflux disease without esophagitis: K21.9

## 2013-08-30 HISTORY — DX: Other specified postprocedural states: R11.2

## 2013-08-30 HISTORY — DX: Presence of spectacles and contact lenses: Z97.3

## 2013-08-30 LAB — POCT HEMOGLOBIN-HEMACUE: Hemoglobin: 12.2 g/dL (ref 12.0–15.0)

## 2013-08-30 SURGERY — OPEN REDUCTION INTERNAL FIXATION (ORIF) PROXIMAL PHALANX
Anesthesia: Regional | Site: Finger | Laterality: Left

## 2013-08-30 MED ORDER — BUPIVACAINE HCL (PF) 0.5 % IJ SOLN
INTRAMUSCULAR | Status: DC | PRN
  Administered 2013-08-30: 5 mL

## 2013-08-30 MED ORDER — PROPOFOL INFUSION 10 MG/ML OPTIME
INTRAVENOUS | Status: DC | PRN
  Administered 2013-08-30: 100 ug/kg/min via INTRAVENOUS

## 2013-08-30 MED ORDER — MIDAZOLAM HCL 2 MG/2ML IJ SOLN
INTRAMUSCULAR | Status: AC
  Filled 2013-08-30: qty 2

## 2013-08-30 MED ORDER — FENTANYL CITRATE 0.05 MG/ML IJ SOLN
50.0000 ug | INTRAMUSCULAR | Status: DC | PRN
  Administered 2013-08-30: 100 ug via INTRAVENOUS

## 2013-08-30 MED ORDER — HYDROMORPHONE HCL PF 1 MG/ML IJ SOLN: 0.2500 mg | INTRAMUSCULAR | Status: DC | PRN

## 2013-08-30 MED ORDER — ONDANSETRON HCL 4 MG/2ML IJ SOLN
INTRAMUSCULAR | Status: DC | PRN
  Administered 2013-08-30: 4 mg via INTRAVENOUS

## 2013-08-30 MED ORDER — VANCOMYCIN HCL IN DEXTROSE 1-5 GM/200ML-% IV SOLN: 1000.0000 mg | INTRAVENOUS | Status: DC

## 2013-08-30 MED ORDER — PROPOFOL 10 MG/ML IV BOLUS
INTRAVENOUS | Status: DC | PRN
  Administered 2013-08-30: 160 mg via INTRAVENOUS

## 2013-08-30 MED ORDER — VANCOMYCIN HCL IN DEXTROSE 1-5 GM/200ML-% IV SOLN
INTRAVENOUS | Status: AC
  Filled 2013-08-30: qty 200

## 2013-08-30 MED ORDER — SCOPOLAMINE 1 MG/3DAYS TD PT72
MEDICATED_PATCH | TRANSDERMAL | Status: AC
  Filled 2013-08-30: qty 1

## 2013-08-30 MED ORDER — BUPIVACAINE-EPINEPHRINE (PF) 0.5% -1:200000 IJ SOLN
INTRAMUSCULAR | Status: DC | PRN
  Administered 2013-08-30: 30 mL

## 2013-08-30 MED ORDER — CHLORHEXIDINE GLUCONATE 4 % EX LIQD: 60.0000 mL | Freq: Once | CUTANEOUS | Status: DC

## 2013-08-30 MED ORDER — VANCOMYCIN HCL 1000 MG IV SOLR
1000.0000 mg | INTRAVENOUS | Status: DC | PRN
  Administered 2013-08-30: 1000 mg via INTRAVENOUS

## 2013-08-30 MED ORDER — FENTANYL CITRATE 0.05 MG/ML IJ SOLN
INTRAMUSCULAR | Status: AC
  Filled 2013-08-30: qty 2

## 2013-08-30 MED ORDER — LIDOCAINE HCL (CARDIAC) 20 MG/ML IV SOLN
INTRAVENOUS | Status: DC | PRN
Start: 2013-08-30 — End: 2013-08-30
  Administered 2013-08-30: 40 mg via INTRAVENOUS

## 2013-08-30 MED ORDER — 0.9 % SODIUM CHLORIDE (POUR BTL) OPTIME
TOPICAL | Status: DC | PRN
  Administered 2013-08-30: 200 mL

## 2013-08-30 MED ORDER — DEXAMETHASONE SODIUM PHOSPHATE 10 MG/ML IJ SOLN
INTRAMUSCULAR | Status: DC | PRN
  Administered 2013-08-30: 10 mg via INTRAVENOUS

## 2013-08-30 MED ORDER — LACTATED RINGERS IV SOLN
INTRAVENOUS | Status: DC
  Administered 2013-08-30: 14:00:00 via INTRAVENOUS

## 2013-08-30 MED ORDER — FENTANYL CITRATE 0.05 MG/ML IJ SOLN
INTRAMUSCULAR | Status: AC
  Filled 2013-08-30: qty 4

## 2013-08-30 MED ORDER — BUPIVACAINE HCL (PF) 0.25 % IJ SOLN
INTRAMUSCULAR | Status: AC
  Filled 2013-08-30: qty 30

## 2013-08-30 MED ORDER — MIDAZOLAM HCL 2 MG/2ML IJ SOLN
1.0000 mg | INTRAMUSCULAR | Status: DC | PRN
  Administered 2013-08-30: 1 mg via INTRAVENOUS

## 2013-08-30 MED ORDER — HYDROCODONE-ACETAMINOPHEN 10-325 MG PO TABS: 1.0000 | ORAL_TABLET | Freq: Four times a day (QID) | ORAL | Status: DC | PRN

## 2013-08-30 SURGICAL SUPPLY — 62 items
BANDAGE COBAN STERILE 2 (GAUZE/BANDAGES/DRESSINGS) IMPLANT
BIT DRILL 1.0 (BIT) ×2
BIT DRILL 1.0X50 (BIT) ×1 IMPLANT
BLADE MINI RND TIP GREEN BEAV (BLADE) ×2 IMPLANT
BLADE SURG 15 STRL LF DISP TIS (BLADE) ×1 IMPLANT
BLADE SURG 15 STRL SS (BLADE) ×1
BNDG COHESIVE 1X5 TAN STRL LF (GAUZE/BANDAGES/DRESSINGS) IMPLANT
BNDG COHESIVE 3X5 TAN STRL LF (GAUZE/BANDAGES/DRESSINGS) ×2 IMPLANT
BNDG ESMARK 4X9 LF (GAUZE/BANDAGES/DRESSINGS) ×2 IMPLANT
BNDG GAUZE ELAST 4 BULKY (GAUZE/BANDAGES/DRESSINGS) ×2 IMPLANT
CHLORAPREP W/TINT 26ML (MISCELLANEOUS) ×2 IMPLANT
CORDS BIPOLAR (ELECTRODE) ×2 IMPLANT
COVER MAYO STAND STRL (DRAPES) ×2 IMPLANT
COVER TABLE BACK 60X90 (DRAPES) ×2 IMPLANT
CUFF TOURNIQUET SINGLE 18IN (TOURNIQUET CUFF) ×2 IMPLANT
DECANTER SPIKE VIAL GLASS SM (MISCELLANEOUS) IMPLANT
DRAPE EXTREMITY T 121X128X90 (DRAPE) ×2 IMPLANT
DRAPE OEC MINIVIEW 54X84 (DRAPES) ×2 IMPLANT
DRAPE SURG 17X23 STRL (DRAPES) ×2 IMPLANT
GAUZE SPONGE 4X4 12PLY STRL (GAUZE/BANDAGES/DRESSINGS) ×2 IMPLANT
GAUZE XEROFORM 1X8 LF (GAUZE/BANDAGES/DRESSINGS) ×2 IMPLANT
GLOVE BIOGEL M STRL SZ7.5 (GLOVE) ×2 IMPLANT
GLOVE BIOGEL PI IND STRL 8 (GLOVE) ×1 IMPLANT
GLOVE BIOGEL PI IND STRL 8.5 (GLOVE) ×1 IMPLANT
GLOVE BIOGEL PI INDICATOR 8 (GLOVE) ×1
GLOVE BIOGEL PI INDICATOR 8.5 (GLOVE) ×1
GLOVE SURG ORTHO 8.0 STRL STRW (GLOVE) ×2 IMPLANT
GOWN STRL REUS W/ TWL LRG LVL3 (GOWN DISPOSABLE) IMPLANT
GOWN STRL REUS W/ TWL XL LVL3 (GOWN DISPOSABLE) ×1 IMPLANT
GOWN STRL REUS W/TWL LRG LVL3 (GOWN DISPOSABLE)
GOWN STRL REUS W/TWL XL LVL3 (GOWN DISPOSABLE) ×3 IMPLANT
K-WIRE PROS .028 4 (WIRE) ×2 IMPLANT
NEEDLE 27GAX1X1/2 (NEEDLE) IMPLANT
NS IRRIG 1000ML POUR BTL (IV SOLUTION) ×2 IMPLANT
PACK BASIN DAY SURGERY FS (CUSTOM PROCEDURE TRAY) ×2 IMPLANT
PAD CAST 3X4 CTTN HI CHSV (CAST SUPPLIES) ×1 IMPLANT
PADDING CAST ABS 4INX4YD NS (CAST SUPPLIES) ×1
PADDING CAST ABS COTTON 4X4 ST (CAST SUPPLIES) ×1 IMPLANT
PADDING CAST COTTON 3X4 STRL (CAST SUPPLIES) ×1
PLATE T 1.3 3H HEAD/8H SFT (Plate) ×1 IMPLANT
PLATE-T 1.3 3H HEAD/8H SFT (Plate) ×2 IMPLANT
SCREW SELF TAP CORTEX 1.3 10MM (Screw) ×6 IMPLANT
SCREW SELF TAP CORTEX 1.3 6MM (Screw) ×4 IMPLANT
SCREW SELF TAP CORTEX 1.3 8MM (Screw) ×2 IMPLANT
SLEEVE SCD COMPRESS KNEE MED (MISCELLANEOUS) ×2 IMPLANT
SPLINT FINGER 5/8X2.25 (CAST SUPPLIES) ×1 IMPLANT
SPLINT PLASTALUME 2 1/4 (CAST SUPPLIES) ×2
SPLINT PLASTER CAST XFAST 3X15 (CAST SUPPLIES) IMPLANT
SPLINT PLASTER CAST XFAST 4X15 (CAST SUPPLIES) ×5 IMPLANT
SPLINT PLASTER XTRA FAST SET 4 (CAST SUPPLIES) ×5
SPLINT PLASTER XTRA FASTSET 3X (CAST SUPPLIES)
STOCKINETTE 4X48 STRL (DRAPES) ×2 IMPLANT
SUT CHROMIC 5 0 P 3 (SUTURE) IMPLANT
SUT CHROMIC 6 0 PS 4 (SUTURE) ×2 IMPLANT
SUT MERSILENE 4 0 P 3 (SUTURE) ×2 IMPLANT
SUT VICRYL 4-0 PS2 18IN ABS (SUTURE) IMPLANT
SUT VICRYL RAPID 5 0 P 3 (SUTURE) ×2 IMPLANT
SUT VICRYL RAPIDE 4/0 PS 2 (SUTURE) IMPLANT
SYR BULB 3OZ (MISCELLANEOUS) ×2 IMPLANT
SYR CONTROL 10ML LL (SYRINGE) IMPLANT
TOWEL OR 17X24 6PK STRL BLUE (TOWEL DISPOSABLE) ×2 IMPLANT
UNDERPAD 30X30 INCONTINENT (UNDERPADS AND DIAPERS) ×2 IMPLANT

## 2013-08-30 NOTE — Brief Op Note (Signed)
08/30/2013  4:00 PM  PATIENT:  [name]  80 y.o. female  PRE-OPERATIVE DIAGNOSIS:  FRACTURE PROXIMAL PHALANX LEFT SMALL FINGER MALLET RING  POST-OPERATIVE DIAGNOSIS:  FRACTURE PROXIMAL PHALANX LEFT SMALL FINGER MALLET RING  PROCEDURE:  Procedure(s): OPEN REDUCTION INTERNAL FIXATION (ORIF) PROXIMAL PHALANX FRACTURE LEFT SMALL FINGER; SPLINT RING FINGER (Left)  SURGEON:  Surgeon(s) and Role:    * Wynonia Sours, MD - Primary  PHYSICIAN ASSISTANT:   ASSISTANTS: none   ANESTHESIA:   regional and general  EBL:  Total I/O In: 500 [I.V.:500] Out: -   BLOOD ADMINISTERED:none  DRAINS: none   LOCAL MEDICATIONS USED:  NONE  SPECIMEN:  No Specimen  DISPOSITION OF SPECIMEN:  N/A  COUNTS:  YES  TOURNIQUET:   Total Tourniquet Time Documented: Upper Arm (Left) - 68 minutes Total: Upper Arm (Left) - 68 minutes   DICTATION: .Other Dictation: Dictation Number [id number]  PLAN OF CARE: Discharge to home after PACU  PATIENT DISPOSITION:  PACU - hemodynamically stable.

## 2013-08-30 NOTE — Progress Notes (Signed)
Assisted Dr. Ola Spurr with left, ultrasound guided, infraclavicular block. Side rails up, monitors on throughout procedure. See vital signs in flow sheet. Tolerated Procedure well.

## 2013-08-30 NOTE — H&P (Signed)
[name] is a 80 year-old right-hand dominant female who suffered a fall on May 23rd.  She was seen at urgent care where x-rays were taken revealing a fracture of her proximal phalanx, left small finger and the inability to extend her ring finger, left hand at the distal interphalangeal joint. She has no prior history of injury.  She was seen and splinted for this and referred. She is not complaining of a great deal of pain or discomfort at the present time, a feeling of swelling, but no numbness or tingling.  ALLERGIES:   Keflex  MEDICATIONS:    Thyroid medicine.  SURGICAL HISTORY:     Back surgery, female surgery, knee and shoulder surgery.  FAMILY MEDICAL HISTORY:     Negative.  SOCIAL HISTORY:      She does not smoke or drink.  She is married, retired.   REVIEW OF SYSTEMS:    Positive for glasses, otherwise negative 14 points. [name] is an 80 y.o. female.   Chief Complaint: fracture left small finger HPI: see above  Past Medical History  Diagnosis Date  . PONV (postoperative nausea and vomiting)   . Arthritis   . GERD (gastroesophageal reflux disease)   . Wears glasses     reading  . Hypothyroidism     Past Surgical History  Procedure Laterality Date  . Knee surgery  2009,2011    partial knee  . Shoulder surgery      rt rcr,and lt  . Back surgery  1972    lumb lam  . Tonsillectomy    . Abdominal hysterectomy  1986  . Carpal tunnel release  2000    right  . Carpal tunnel release  2012    left  . Cystocele repair  2007    sling  . Colonoscopy      History reviewed. No pertinent family history. Social History:  reports that she has quit smoking. She has never used smokeless tobacco. She reports that she does not drink alcohol or use illicit drugs.  Allergies:  Allergies  Allergen Reactions  . Keflex [Cephalexin] Hives    Medications Prior to Admission  Medication Sig Dispense Refill  . calcium carbonate (OS-CAL) 600 MG TABS Take 600 mg  by mouth daily.      . cholecalciferol (VITAMIN D) 1000 UNITS tablet Take 1,000 Units by mouth daily.      . cyclobenzaprine (FLEXERIL) 10 MG tablet Take 10 mg by mouth 3 (three) times daily as needed for muscle spasms.      Marland Kitchen HYDROcodone-acetaminophen (NORCO/VICODIN) 5-325 MG per tablet Take 1 tablet by mouth every 4 (four) hours as needed.  15 tablet  0  . levothyroxine (SYNTHROID, LEVOTHROID) 50 MCG tablet Take 50 mcg by mouth daily.      . Multiple Vitamin (MULTIVITAMIN WITH MINERALS) TABS Take 1 tablet by mouth daily.      . naproxen sodium (ANAPROX) 220 MG tablet Take 440 mg by mouth every morning.      Marland Kitchen omeprazole (PRILOSEC) 20 MG capsule Take 20 mg by mouth daily.        No results found for this or any previous visit (from the past 48 hour(s)).  No results found.   Pertinent items are noted in HPI.  Blood pressure 144/79, pulse 71, temperature 98 F (36.7 C), temperature source Oral, resp. rate 18, height 5\' 6"  (1.676 m), weight 84.539 kg (186 lb 6 oz), SpO2 100.00%.  General appearance: alert, cooperative and appears stated  age Head: Normocephalic, without obvious abnormality Neck: no JVD Resp: clear to auscultation bilaterally Cardio: regular rate and rhythm, S1, S2 normal, no murmur, click, rub or gallop GI: soft, non-tender; bowel sounds normal; no masses,  no organomegaly Extremities: deformity left small finger mallet to ring Pulses: 2+ and symmetric Skin: Skin color, texture, turgor normal. No rashes or lesions or hematoma small finger Neurologic: Grossly normal Incision/Wound: na  Assessment/Plan RADIOGRAPHS:    X-rays reveal a markedly comminuted fracture proximal phalanx small finger.    DIAGNOSIS:     Mallet deformity, ring finger with fracture proximal phalanx, left small finger.   RECOMMENDATIONS/PLAN:    ORIF of her small finger with splinting of her right finger.  She is aware of risks and complications including infection, nonunion, delayed union,  stiffness to her finger.  She would like to proceed to have this done.  This will be scheduled as an outpatient under regional anesthesia.  Wynonia Sours 08/30/2013, 1:27 PM

## 2013-08-30 NOTE — Transfer of Care (Signed)
Immediate Anesthesia Transfer of Care Note  Patient: [name]  Procedure(s) Performed: Procedure(s): OPEN REDUCTION INTERNAL FIXATION (ORIF) PROXIMAL PHALANX FRACTURE LEFT SMALL FINGER; SPLINT RING FINGER (Left)  Patient Location: PACU  Anesthesia Type:General  Level of Consciousness: awake and alert   Airway & Oxygen Therapy: Patient Spontanous Breathing and Patient connected to face mask oxygen  Post-op Assessment: Report given to PACU RN and Post -op Vital signs reviewed and stable  Post vital signs: Reviewed and stable  Complications: No apparent anesthesia complications

## 2013-08-30 NOTE — Op Note (Addendum)
Dictation Number [id number] Intra-operative fluoroscopic images in the AP, lateral, and oblique views were taken and evaluated by myself.  Reduction and hardware placement were confirmed.  There was no intraarticular penetration of permanent hardware.

## 2013-08-30 NOTE — Discharge Instructions (Addendum)
[facility] Instructions °Hand Surgery ° °Wound Care: °Keep your hand elevated above the level of your heart.  Do not allow it to dangle by your side.  Keep the dressing dry and do not remove it unless your doctor advises you to do so.  He will usually change it at the time of your post-op visit.  Moving your fingers is advised to stimulate circulation but will depend on the site of your surgery.  If you have a splint applied, your doctor will advise you regarding movement. ° °Activity: °Do not drive or operate machinery today.  Rest today and then you may return to your normal activity and work as indicated by your physician. ° °Diet:  °Drink liquids today or eat a light diet.  You may resume a regular diet tomorrow.   ° °General expectations: °Pain for two to three days. °Fingers may become slightly swollen. ° °Call your doctor if any of the following occur: °Severe pain not relieved by pain medication. °Elevated temperature. °Dressing soaked with blood. °Inability to move fingers. °White or bluish color to fingers. ° ° °Post Anesthesia Home Care Instructions ° °Activity: °Get plenty of rest for the remainder of the day. A responsible adult should stay with you for 24 hours following the procedure.  °For the next 24 hours, DO NOT: °-Drive a car °-Operate machinery °-Drink alcoholic beverages °-Take any medication unless instructed by your physician °-Make any legal decisions or sign important papers. ° °Meals: °Start with liquid foods such as gelatin or soup. Progress to regular foods as tolerated. Avoid greasy, spicy, heavy foods. If nausea and/or vomiting occur, drink only clear liquids until the nausea and/or vomiting subsides. Call your physician if vomiting continues. ° °Special Instructions/Symptoms: °Your throat may feel dry or sore from the anesthesia or the breathing tube placed in your throat during surgery. If this causes discomfort, gargle with warm salt water. The discomfort should disappear within 24  hours. ° ° °Regional Anesthesia Blocks ° °1. Numbness or the inability to move the "blocked" extremity may last from 3-48 hours after placement. The length of time depends on the medication injected and your individual response to the medication. If the numbness is not going away after 48 hours, call your surgeon. ° °2. The extremity that is blocked will need to be protected until the numbness is gone and the  Strength has returned. Because you cannot feel it, you will need to take extra care to avoid injury. Because it may be weak, you may have difficulty moving it or using it. You may not know what position it is in without looking at it while the block is in effect. ° °3. For blocks in the legs and feet, returning to weight bearing and walking needs to be done carefully. You will need to wait until the numbness is entirely gone and the strength has returned. You should be able to move your leg and foot normally before you try and bear weight or walk. You will need someone to be with you when you first try to ensure you do not fall and possibly risk injury. ° °4. Bruising and tenderness at the needle site are common side effects and will resolve in a few days. ° °5. Persistent numbness or new problems with movement should be communicated to the surgeon or the [facility] ([phone])/ [facility] ([phone]). °

## 2013-08-30 NOTE — Anesthesia Procedure Notes (Addendum)
Anesthesia Regional Block:  Supraclavicular block  Pre-Anesthetic Checklist: ,, timeout performed, Correct Patient, Correct Site, Correct Laterality, Correct Procedure, Correct Position, site marked, Risks and benefits discussed, pre-op evaluation, post-op pain management  Laterality: Left  Prep: Maximum Sterile Barrier Precautions used and chloraprep       Needles:  Injection technique: Single-shot  Needle Type: Echogenic Stimulator Needle     Needle Length: 5cm 5 cm Needle Gauge: 22 and 22 G    Additional Needles:  Procedures: ultrasound guided (picture in chart) Supraclavicular block Narrative:  Start time: 08/30/2013 1:29 PM End time: 08/30/2013 1:40 PM Injection made incrementally with aspirations every 5 mL. Anesthesiologist: Fitzgerald,MD  Additional Notes: 2% Lidocaine skin wheel.    Procedure Name: LMA Insertion Date/Time: 08/30/2013 2:35 PM Performed by: Toula Moos L Pre-anesthesia Checklist: Patient identified, Emergency Drugs available, Suction available, Patient being monitored and Timeout performed Patient Re-evaluated:Patient Re-evaluated prior to inductionOxygen Delivery Method: Circle System Utilized Preoxygenation: Pre-oxygenation with 100% oxygen Intubation Type: IV induction Ventilation: Mask ventilation without difficulty LMA: LMA inserted LMA Size: 3.0 Number of attempts: 1 Airway Equipment and Method: bite block Placement Confirmation: positive ETCO2 and breath sounds checked- equal and bilateral Tube secured with: Tape Dental Injury: Teeth and Oropharynx as per pre-operative assessment

## 2013-08-30 NOTE — Anesthesia Postprocedure Evaluation (Signed)
  Anesthesia Post-op Note  Patient: [name]  Procedure(s) Performed: Procedure(s): OPEN REDUCTION INTERNAL FIXATION (ORIF) PROXIMAL PHALANX FRACTURE LEFT SMALL FINGER; SPLINT RING FINGER (Left)  Patient Location: PACU  Anesthesia Type:General and block  Level of Consciousness: awake and alert   Airway and Oxygen Therapy: Patient Spontanous Breathing  Post-op Pain: none  Post-op Assessment: Post-op Vital signs reviewed, Patient's Cardiovascular Status Stable and Respiratory Function Stable  Post-op Vital Signs: Reviewed  Filed Vitals:   08/30/13 1615  BP: 133/64  Pulse: 65  Temp:   Resp: 14    Complications: No apparent anesthesia complications

## 2013-08-30 NOTE — Anesthesia Preprocedure Evaluation (Addendum)
Anesthesia Evaluation  Patient identified by MRN, date of birth, ID band Patient awake    Reviewed: Allergy & Precautions, H&P , NPO status , Patient's Chart, lab work & pertinent test results  History of Anesthesia Complications (+) PONVNegative for: history of anesthetic complications  Airway Mallampati: II TM Distance: >3 FB Neck ROM: Full    Dental no notable dental hx. (+) Teeth Intact, Dental Advisory Given   Pulmonary neg pulmonary ROS, former smoker,  breath sounds clear to auscultation  Pulmonary exam normal       Cardiovascular negative cardio ROS  Rhythm:Regular Rate:Normal     Neuro/Psych negative neurological ROS  negative psych ROS   GI/Hepatic Neg liver ROS, GERD-  Medicated and Controlled,  Endo/Other  Hypothyroidism   Renal/GU negative Renal ROS  negative genitourinary   Musculoskeletal   Abdominal   Peds  Hematology negative hematology ROS (+)   Anesthesia Other Findings   Reproductive/Obstetrics negative OB ROS                          Anesthesia Physical Anesthesia Plan  ASA: II  Anesthesia Plan: General and Regional   Post-op Pain Management:    Induction: Intravenous  Airway Management Planned: LMA  Additional Equipment:   Intra-op Plan:   Post-operative Plan: Extubation in OR  Informed Consent: I have reviewed the patients History and Physical, chart, labs and discussed the procedure including the risks, benefits and alternatives for the proposed anesthesia with the patient or authorized representative who has indicated his/her understanding and acceptance.   Dental advisory given  Plan Discussed with: CRNA  Anesthesia Plan Comments:         Anesthesia Quick Evaluation

## 2013-09-02 ENCOUNTER — Encounter (HOSPITAL_BASED_OUTPATIENT_CLINIC_OR_DEPARTMENT_OTHER): Payer: Self-pay | Admitting: Orthopedic Surgery

## 2013-09-02 NOTE — Op Note (Signed)
[name], [name] NO.:  [id number]  MEDICAL RECORD NO.:  [id number]  LOCATION:                               FACILITY:  [facility]  PHYSICIAN:  Daryll Brod, RoweD.       DATE OF BIRTH:  Jul 12, 1932  DATE OF PROCEDURE:  08/30/2013 DATE OF DISCHARGE:  08/30/2013                              OPERATIVE REPORT   PREOPERATIVE DIAGNOSIS:  Fracture of proximal phalanx, left small finger with mallet deformity, ring finger, left hand.  POSTOPERATIVE DIAGNOSES:  Fracture of proximal phalanx, left small finger with mallet deformity, ring finger, left hand.  OPERATION:  Open reduction and internal fixation of left small finger with splinting of ring finger, small finger proximal phalanx with modular hand, 1.3 plate and screws.  SURGEON:  Daryll Brod, RoweD.  ASSISTANT:  None.  ANESTHESIA:  Supraclavicular block, general.  ANESTHESIOLOGIST:  W. Oren Bracket, MD  HISTORY:  The patient is a 80 year old female who suffered a fall with markedly comminuted fracture of her proximal phalanx, left small finger and a mallet deformity of her ring finger, left hand.  She is admitted for open reduction and internal fixation and is aware of risks and complications including infection; recurrence of injury to arteries, nerves, tendons; incomplete relief of symptoms; dystrophy; nonunion; stiffness of her fingers.  In the preoperative area, the patient was seen, the extremity marked by both patient and surgeon, antibiotic given.  PROCEDURE IN DETAIL:  The patient was brought to the operating room where a supraclavicular block had been carried out in the preoperative area.  A general anesthetic was given in the operating room under the direction of Dr. Ola Spurr.  She was prepped using ChloraPrep, supine position, left arm free.  A 3-minute dry-time was allowed, time-out taken, confirming the patient and procedure.  The limb was exsanguinated with an Esmarch bandage.  Tourniquet  placed high on the arm was inflated to 250 mmHg.  A longitudinal incision was made over the proximal phalanx, left little finger, carried down through the subcutaneous tissue.  Bleeders were electrocauterized with bipolar.  The extensor tendon was split longitudinally.  The periosteum was incised, the fracture was immediately apparent.  Multiple fragments were present and at least 7 and possibly 8 fragments.  There was a large distal fragment, a relatively large proximal fragment, which were felt to be capable of maintaining screws for distraction and stabilization of the comminuted central aspect of the phalanx.  The area was debrided.  The periosteum elevated.  One fragment on the dorsal cortex was entirely freed, this was removed temporarily.  The internal aspect of the fracture was debrided with curettes.  Clamps were placed.  Two crossed K-wires were used to stabilize the proximal distal fragments, two large central fragments were stabilize to each one of these allowing clamps to be placed to maintain the finger out to length.  The central free fragment was replaced into the defect dorsally.  A 1.3-mm modular hand T-plate was then selected.  This was able to be placed proximally.  A single central screw was placed.  This allowed the proximal fragment to be stabilize, this measured 10 mm.  The fracture was distracted.  Two distal screws  were placed in the distal fragment, these were both 6 mm. X-rays confirmed that the fracture was reasonably well aligned with flexion and extension of the finger.  There was no angulatory deformity or overlap.  The two proximal screws were then inserted, each measured 10 mm and this significantly stabilized the fracture.  Two of the large central fragments were screwed together using an interfragmentary screw, this measured 8 mm.  All screws were 1.3-mm screws.  This stabilized the fracture.  AP, lateral, and oblique x-rays revealed that it was  in reasonably good position.  There was still a very minimal dorsal angulatory deformity to it, and this was felt to be acceptable due to the amount of comminution that would be difficult to change this.  No fragments.  Screws entered into the joints proximally and distally and none were prominent on the volar surface.  Wound was copiously irrigated with saline.  The periosteum was closed as much as possible with figure- of-eight 6-0 chromic sutures.  The extensor tendon was repaired with with a running 5-0 Mersilene suture.  The finger placed through a full range of motion, full flexion, full extension were possible with no angulatory, rotatory or overlap deformity noted.  The skin was then closed with interrupted 4-0 Vicryl Rapide.  A sterile compressive dressing was applied.  A splint to the distal interphalangeal joint of the ring finger was applied on the volar surface maintaining this an extension.  On deflation of the tourniquet, all fingers were immediately pinked.  She was taken to the recovery room for observation in satisfactory condition.  She will be discharged to home to return to the [facility] in 1 week, on Vicodin.          ______________________________ Daryll Brod, RoweD.     GK/MEDQ  D:  08/30/2013  T:  08/31/2013  Job:  [id number]

## 2013-11-25 ENCOUNTER — Ambulatory Visit
Admission: RE | Admit: 2013-11-25 | Discharge: 2013-11-25 | Disposition: A | Discharge status: Home / Self Care | Payer: Medicare Other | Source: Ambulatory Visit | Attending: Internal Medicine | Admitting: Internal Medicine

## 2013-11-25 ENCOUNTER — Other Ambulatory Visit: Payer: Self-pay | Admitting: Internal Medicine

## 2013-11-25 DIAGNOSIS — M810 Age-related osteoporosis without current pathological fracture: Secondary | ICD-10-CM

## 2013-12-24 ENCOUNTER — Other Ambulatory Visit: Payer: Self-pay | Admitting: Orthopedic Surgery

## 2014-01-30 ENCOUNTER — Encounter (HOSPITAL_BASED_OUTPATIENT_CLINIC_OR_DEPARTMENT_OTHER): Payer: Self-pay | Admitting: *Deleted

## 2014-01-30 NOTE — Progress Notes (Signed)
Pt was here 5/15-no problems-no labs needed

## 2014-02-03 ENCOUNTER — Encounter (HOSPITAL_BASED_OUTPATIENT_CLINIC_OR_DEPARTMENT_OTHER): Payer: Self-pay | Admitting: *Deleted

## 2014-02-04 ENCOUNTER — Ambulatory Visit (HOSPITAL_BASED_OUTPATIENT_CLINIC_OR_DEPARTMENT_OTHER): Payer: Medicare Other | Admitting: Anesthesiology

## 2014-02-04 ENCOUNTER — Encounter (HOSPITAL_BASED_OUTPATIENT_CLINIC_OR_DEPARTMENT_OTHER): Payer: Self-pay | Admitting: Orthopedic Surgery

## 2014-02-04 ENCOUNTER — Encounter (HOSPITAL_BASED_OUTPATIENT_CLINIC_OR_DEPARTMENT_OTHER)
Admission: RE | Disposition: A | Discharge status: Home / Self Care | Payer: Self-pay | Source: Ambulatory Visit | Attending: Orthopedic Surgery

## 2014-02-04 ENCOUNTER — Ambulatory Visit (HOSPITAL_BASED_OUTPATIENT_CLINIC_OR_DEPARTMENT_OTHER)
Admission: RE | Admit: 2014-02-04 | Discharge: 2014-02-04 | Disposition: A | Discharge status: Home / Self Care | Payer: Medicare Other | Source: Ambulatory Visit | Attending: Orthopedic Surgery | Admitting: Orthopedic Surgery

## 2014-02-04 DIAGNOSIS — Z881 Allergy status to other antibiotic agents status: Secondary | ICD-10-CM | POA: Diagnosis not present

## 2014-02-04 DIAGNOSIS — W19XXXS Unspecified fall, sequela: Secondary | ICD-10-CM | POA: Diagnosis not present

## 2014-02-04 DIAGNOSIS — E039 Hypothyroidism, unspecified: Secondary | ICD-10-CM | POA: Diagnosis not present

## 2014-02-04 DIAGNOSIS — M19041 Primary osteoarthritis, right hand: Secondary | ICD-10-CM | POA: Insufficient documentation

## 2014-02-04 DIAGNOSIS — K219 Gastro-esophageal reflux disease without esophagitis: Secondary | ICD-10-CM | POA: Insufficient documentation

## 2014-02-04 HISTORY — PX: DISTAL INTERPHALANGEAL JOINT FUSION: SHX6428

## 2014-02-04 LAB — POCT HEMOGLOBIN-HEMACUE: Hemoglobin: 13.2 g/dL (ref 12.0–15.0)

## 2014-02-04 SURGERY — DISTAL INTERPHALANGEAL JOINT FUSION
Anesthesia: General | Site: Finger | Laterality: Right

## 2014-02-04 MED ORDER — VANCOMYCIN HCL IN DEXTROSE 500-5 MG/100ML-% IV SOLN
INTRAVENOUS | Status: AC
  Filled 2014-02-04: qty 200

## 2014-02-04 MED ORDER — FENTANYL CITRATE 0.05 MG/ML IJ SOLN
INTRAMUSCULAR | Status: AC
  Filled 2014-02-04: qty 2

## 2014-02-04 MED ORDER — CHLORHEXIDINE GLUCONATE 4 % EX LIQD: 60.0000 mL | Freq: Once | CUTANEOUS | Status: DC

## 2014-02-04 MED ORDER — PROPOFOL 10 MG/ML IV BOLUS
INTRAVENOUS | Status: DC | PRN
  Administered 2014-02-04: 160 mg via INTRAVENOUS

## 2014-02-04 MED ORDER — VANCOMYCIN HCL IN DEXTROSE 1-5 GM/200ML-% IV SOLN
1000.0000 mg | INTRAVENOUS | Status: AC
  Administered 2014-02-04: 1000 mg via INTRAVENOUS

## 2014-02-04 MED ORDER — ONDANSETRON HCL 4 MG/2ML IJ SOLN
INTRAMUSCULAR | Status: DC | PRN
  Administered 2014-02-04: 4 mg via INTRAVENOUS

## 2014-02-04 MED ORDER — MIDAZOLAM HCL 2 MG/2ML IJ SOLN
INTRAMUSCULAR | Status: AC
  Filled 2014-02-04: qty 2

## 2014-02-04 MED ORDER — HYDROCODONE-ACETAMINOPHEN 10-325 MG PO TABS: 1.0000 | ORAL_TABLET | Freq: Four times a day (QID) | ORAL | Status: DC | PRN

## 2014-02-04 MED ORDER — DEXAMETHASONE SODIUM PHOSPHATE 10 MG/ML IJ SOLN
INTRAMUSCULAR | Status: DC | PRN
Start: 2014-02-04 — End: 2014-02-04
  Administered 2014-02-04: 10 mg via INTRAVENOUS

## 2014-02-04 MED ORDER — FENTANYL CITRATE 0.05 MG/ML IJ SOLN
INTRAMUSCULAR | Status: DC | PRN
  Administered 2014-02-04: 100 ug via INTRAVENOUS

## 2014-02-04 MED ORDER — LACTATED RINGERS IV SOLN
INTRAVENOUS | Status: DC
  Administered 2014-02-04: 09:00:00 via INTRAVENOUS

## 2014-02-04 MED ORDER — ONDANSETRON HCL 4 MG/2ML IJ SOLN: 4.0000 mg | Freq: Once | INTRAMUSCULAR | Status: DC | PRN

## 2014-02-04 MED ORDER — FENTANYL CITRATE 0.05 MG/ML IJ SOLN
25.0000 ug | INTRAMUSCULAR | Status: DC | PRN
Start: 2014-02-04 — End: 2014-02-04

## 2014-02-04 MED ORDER — BUPIVACAINE HCL (PF) 0.25 % IJ SOLN
INTRAMUSCULAR | Status: DC | PRN
  Administered 2014-02-04: 10 mL

## 2014-02-04 MED ORDER — MIDAZOLAM HCL 2 MG/2ML IJ SOLN
1.0000 mg | INTRAMUSCULAR | Status: DC | PRN
  Administered 2014-02-04 (×2): 1 mg via INTRAVENOUS

## 2014-02-04 MED ORDER — FENTANYL CITRATE 0.05 MG/ML IJ SOLN: 50.0000 ug | INTRAMUSCULAR | Status: DC | PRN

## 2014-02-04 SURGICAL SUPPLY — 54 items
AREX ×2 IMPLANT
BLADE MINI RND TIP GREEN BEAV (BLADE) ×2 IMPLANT
BLADE SURG 15 STRL LF DISP TIS (BLADE) ×1 IMPLANT
BLADE SURG 15 STRL SS (BLADE) ×1
BNDG COHESIVE 1X5 TAN STRL LF (GAUZE/BANDAGES/DRESSINGS) ×2 IMPLANT
BNDG COHESIVE 3X5 TAN STRL LF (GAUZE/BANDAGES/DRESSINGS) ×2 IMPLANT
BNDG ESMARK 4X9 LF (GAUZE/BANDAGES/DRESSINGS) ×2 IMPLANT
BNDG GAUZE ELAST 4 BULKY (GAUZE/BANDAGES/DRESSINGS) ×2 IMPLANT
BUR FAST CUTTING MED (BURR) IMPLANT
CHLORAPREP W/TINT 26ML (MISCELLANEOUS) ×2 IMPLANT
CORDS BIPOLAR (ELECTRODE) ×2 IMPLANT
COVER BACK TABLE 60X90IN (DRAPES) ×2 IMPLANT
COVER MAYO STAND STRL (DRAPES) ×2 IMPLANT
CUFF TOURNIQUET SINGLE 18IN (TOURNIQUET CUFF) ×2 IMPLANT
DRAPE EXTREMITY T 121X128X90 (DRAPE) ×2 IMPLANT
DRAPE OEC MINIVIEW 54X84 (DRAPES) ×2 IMPLANT
DRAPE SURG 17X23 STRL (DRAPES) ×2 IMPLANT
GAUZE SPONGE 4X4 12PLY STRL (GAUZE/BANDAGES/DRESSINGS) ×2 IMPLANT
GAUZE XEROFORM 1X8 LF (GAUZE/BANDAGES/DRESSINGS) ×2 IMPLANT
GLOVE BIO SURGEON STRL SZ 6.5 (GLOVE) ×2 IMPLANT
GLOVE BIO SURGEON STRL SZ7.5 (GLOVE) ×2 IMPLANT
GLOVE BIOGEL PI IND STRL 7.0 (GLOVE) ×1 IMPLANT
GLOVE BIOGEL PI IND STRL 8 (GLOVE) ×1 IMPLANT
GLOVE BIOGEL PI IND STRL 8.5 (GLOVE) ×1 IMPLANT
GLOVE BIOGEL PI INDICATOR 7.0 (GLOVE) ×1
GLOVE BIOGEL PI INDICATOR 8 (GLOVE) ×1
GLOVE BIOGEL PI INDICATOR 8.5 (GLOVE) ×1
GLOVE SURG ORTHO 8.0 STRL STRW (GLOVE) ×2 IMPLANT
GOWN STRL REUS W/ TWL LRG LVL3 (GOWN DISPOSABLE) ×1 IMPLANT
GOWN STRL REUS W/TWL LRG LVL3 (GOWN DISPOSABLE) ×1
GOWN STRL REUS W/TWL XL LVL3 (GOWN DISPOSABLE) ×4 IMPLANT
K-WIRE .035X4 (WIRE) ×2 IMPLANT
NEEDLE 27GAX1X1/2 (NEEDLE) ×2 IMPLANT
NS IRRIG 1000ML POUR BTL (IV SOLUTION) ×2 IMPLANT
PACK BASIN DAY SURGERY FS (CUSTOM PROCEDURE TRAY) ×2 IMPLANT
PAD CAST 3X4 CTTN HI CHSV (CAST SUPPLIES) ×1 IMPLANT
PADDING CAST ABS 3INX4YD NS (CAST SUPPLIES)
PADDING CAST ABS 4INX4YD NS (CAST SUPPLIES) ×1
PADDING CAST ABS COTTON 3X4 (CAST SUPPLIES) IMPLANT
PADDING CAST ABS COTTON 4X4 ST (CAST SUPPLIES) ×1 IMPLANT
PADDING CAST COTTON 3X4 STRL (CAST SUPPLIES) ×1
SCREW LP 35MM (Screw) ×2 IMPLANT
SLEEVE SCD COMPRESS KNEE MED (MISCELLANEOUS) ×2 IMPLANT
SPLINT FINGER 4.25 BULB 911906 (SOFTGOODS) ×2 IMPLANT
SPLINT PLASTER CAST XFAST 3X15 (CAST SUPPLIES) ×10 IMPLANT
SPLINT PLASTER XTRA FASTSET 3X (CAST SUPPLIES) ×10
STOCKINETTE 4X48 STRL (DRAPES) ×2 IMPLANT
SUT ETHILON 5 0 PS 2 18 (SUTURE) ×2 IMPLANT
SUT VICRYL 4-0 PS2 18IN ABS (SUTURE) ×2 IMPLANT
SUT VICRYL RAPIDE 4/0 PS 2 (SUTURE) ×2 IMPLANT
SYR BULB 3OZ (MISCELLANEOUS) ×2 IMPLANT
SYR CONTROL 10ML LL (SYRINGE) ×2 IMPLANT
TOWEL OR 17X24 6PK STRL BLUE (TOWEL DISPOSABLE) ×2 IMPLANT
UNDERPAD 30X30 INCONTINENT (UNDERPADS AND DIAPERS) ×2 IMPLANT

## 2014-02-04 NOTE — H&P (Signed)
  [name] is a 80 year-old right-hand dominant female who suffered a fall on May 23rd.  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 She complains of pain not responsive to conserative measures and the significant angulation deformity.  ALLERGIES:   Keflex  MEDICATIONS:    Thyroid medicine.  SURGICAL HISTORY:     Back surgery, female surgery, knee and shoulder surgery.  FAMILY MEDICAL HISTORY:     Negative.  SOCIAL HISTORY:      She does not smoke or drink.  She is married, retired.   REVIEW OF SYSTEMS:    Positive for glasses, otherwise negative 14 points. [name] is an 80 y.o. female.   Chief Complaint: arthritis DIP index right HPI: see above  Past Medical History  Diagnosis Date  . PONV (postoperative nausea and vomiting)   . Arthritis   . GERD (gastroesophageal reflux disease)   . Wears glasses     reading  . Hypothyroidism     Past Surgical History  Procedure Laterality Date  . Knee surgery  2009,2011    partial knee  . Shoulder surgery      rt rcr,and lt  . Back surgery  1972    lumb lam  . Tonsillectomy    . Abdominal hysterectomy  1986  . Carpal tunnel release  2000    right  . Carpal tunnel release  2012    left  . Cystocele repair  2007    sling  . Colonoscopy    . Open reduction internal fixation (orif) proximal phalanx Left 08/30/2013    Procedure: OPEN REDUCTION INTERNAL FIXATION (ORIF) PROXIMAL PHALANX FRACTURE LEFT SMALL FINGER; SPLINT RING FINGER;  Surgeon: Wynonia Sours, MD;  Location: [facility];  Service: Orthopedics;  Laterality: Left;  [facility] Eye surgery      History reviewed. No pertinent family history. Social History:  reports that she has quit smoking. She has never used smokeless tobacco. She reports that she does not drink alcohol or use illicit drugs.  Allergies:  Allergies  Allergen Reactions  . Keflex [Cephalexin]  Hives    No prescriptions prior to admission    No results found for this or any previous visit (from the past 18 hour(s)).  No results found.   Pertinent items are noted in HPI.  Height 5\' 6"  (1.676 m), weight 83.915 kg (185 lb).  General appearance: alert, cooperative and appears stated age Head: Normocephalic, without obvious abnormality Neck: no JVD Resp: clear to auscultation bilaterally Cardio: regular rate and rhythm, S1, S2 normal, no murmur, click, rub or gallop GI: soft, non-tender; bowel sounds normal; no masses,  no organomegaly Extremities: extremities normal, atraumatic, no cyanosis or edema and right index DJD Pulses: 2+ and symmetric Skin: Skin color, texture, turgor normal. No rashes or lesions Neurologic: Grossly normal Incision/Wound: na  Assessment/Plan Diagnosis: degenerative arthritis index finger right hand She is scheduled for fusion of her right index finger   [name] 02/04/2014, 5:11 AM

## 2014-02-04 NOTE — Op Note (Signed)
[name], [name]           ACCOUNT NO.:  [id number]  MEDICAL RECORD NO.:  [id number]  LOCATION:                                 FACILITY:  PHYSICIAN:  Daryll Brod, M.D.       DATE OF BIRTH:  Mar 08, 1933  DATE OF PROCEDURE:  02/04/2014 DATE OF DISCHARGE:                              OPERATIVE REPORT   PREOPERATIVE DIAGNOSIS:  Degenerative arthritis, distal interphalangeal joint, right index finger.  POSTOPERATIVE DIAGNOSIS:  Degenerative arthritis, distal interphalangeal joint, right index finger.  OPERATION:  Fusion distal interphalangeal joint, right index finger.  SURGEON:  Daryll Brod, MD  ASSISTANT:  Leanora Cover, MD  ANESTHESIA:  General with metacarpal block.  ANESTHESIOLOGIST:  Lorrene Reid, MD  HISTORY:  The patient is a 80 year old female with a history of degenerative arthritis of the distal interphalangeal joint of her right index finger with significant deformity.  She has elected to undergo fusion of the distal interphalangeal joint with the attempt of straightening.  Pre, peri and postoperative course have been discussed along with risks and complications.  She is aware that there is no guarantee with the surgery, possibility of infection, recurrence of injury to arteries, nerves, tendons, incomplete relief of symptoms and dystrophy, the possibility of nonunion.  In the preoperative area, the patient is seen, the extremity marked by both the patient and surgeon. Antibiotic given.  PROCEDURE IN DETAIL:  The patient was brought to the operating room, where general anesthetic was carried out without difficulty, and then an IV was not able to be established in a satisfactory manner for IV regional.  She was prepped using ChloraPrep, supine position with the right arm free.  A 3-minute dry time was allowed.  Time-out taken confirming the patient and procedure.  The limb was exsanguinated with an Esmarch bandage.  Tourniquet placed high and the arm was  inflated to 250 mmHg.  An inch incision was made over the distal interphalangeal joint of the right index finger and carried down through subcutaneous tissue.  A very large osteophyte was immediately encountered, this was dissected free allowing visualization of the joint. This was entirely destroyed with no cartilage being present on any of the middle or distal phalanx with significant erosion of the distal phalanx.  The collateral ligaments were incised allowing sharp cutting of the joint.  Osteophytes were then removed with a rongeur.  The cartilage removed from the middle phalanx preserving as much length of bone as possible. Drill holes were then placed in the base of the distal phalanx.  Using a rongeur, this was then taken down to cancellous bone.  A guide pin was then passed from a proximal to distal direction in the distal phalanx, confirmed in position with AP and lateral x-rays that it was down to central aspect of the shaft of the distal phalanx.  This was then driven through the dorsal cortex and out through the skin.  A guide hole was then placed into the middle phalanx in the central portion.  The guide pin was then inserted proximally and found to be slight dorsal with several manipulations.  This was finally placed in the central aspect of the middle phalanx on both AP and  lateral x-rays.  The deformity was able to be corrected.  A 35-mm __________ headless screw for fusion of the distal interphalangeal joint was then inserted after making an incision through the skin distally.  This firmly compressed the fusion site.  The x-rays in AP, lateral revealed the distal interphalangeal joint well compressed with the screw in adequate position both AP and lateral x- rays.  The wound was irrigated.  The skin then closed with interrupted 5- 0 nylon sutures including the incision distally.  A sterile compressive dressing and splint to the digit was applied after metacarpal block.   A 10 mL of 0.25% Marcaine without epinephrine was given.  The patient tolerated the procedure well, was taken to the recovery room for observation in satisfactory condition.  She will be discharged home to return to the [facility] in 1 week on Vicodin.          ______________________________ Daryll Brod, M.D.     GK/MEDQ  D:  02/04/2014  T:  02/04/2014  Job:  [id number]

## 2014-02-04 NOTE — Anesthesia Postprocedure Evaluation (Signed)
  Anesthesia Post-op Note  Patient: [name]  Procedure(s) Performed: Procedure(s): FUSION DISTAL INTERPHALANGEAL JOINT RIGHT INDEX FINGER  (Right)  Patient Location: PACU  Anesthesia Type: General   Level of Consciousness: awake, alert  and oriented  Airway and Oxygen Therapy: Patient Spontanous Breathing  Post-op Pain: none  Post-op Assessment: Post-op Vital signs reviewed  Post-op Vital Signs: Reviewed  Last Vitals:  Filed Vitals:   02/04/14 1228  BP:   Pulse: 57  Temp:   Resp: 18    Complications: No apparent anesthesia complications

## 2014-02-04 NOTE — Brief Op Note (Signed)
02/04/2014  11:02 AM  PATIENT:  Raul Del  80 y.o. female  PRE-OPERATIVE DIAGNOSIS:  ARTHRITIS DISTAL INTERPHALANGEAL JOINT RIGHT INDEX FINGER   POST-OPERATIVE DIAGNOSIS:  Arthritis Distal Interphalangeal joint Right Index Finger  PROCEDURE:  Procedure(s): FUSION DISTAL INTERPHALANGEAL JOINT RIGHT INDEX FINGER  (Right)  SURGEON:  Surgeon(s) and Role:    * Daryll Brod, MD - Primary  PHYSICIAN ASSISTANT:   ASSISTANTS: K Araceli Arango,MD   ANESTHESIA:   local and general  EBL:  Total I/O In: 800 [I.V.:800] Out: -   BLOOD ADMINISTERED:none  DRAINS: none   LOCAL MEDICATIONS USED:  BUPIVICAINE   SPECIMEN:  No Specimen  DISPOSITION OF SPECIMEN:  N/A  COUNTS:  YES  TOURNIQUET:   Total Tourniquet Time Documented: Upper Arm (Right) - 52 minutes Total: Upper Arm (Right) - 52 minutes   DICTATION: .Other Dictation: Dictation Number [id number]  PLAN OF CARE: Discharge to home after PACU  PATIENT DISPOSITION:  PACU - hemodynamically stable.

## 2014-02-04 NOTE — Discharge Instructions (Addendum)

## 2014-02-04 NOTE — Anesthesia Procedure Notes (Signed)
Procedure Name: LMA Insertion Date/Time: 02/04/2014 9:43 AM Performed by: Maryella Shivers Pre-anesthesia Checklist: Patient identified, Emergency Drugs available, Suction available and Patient being monitored Patient Re-evaluated:Patient Re-evaluated prior to inductionOxygen Delivery Method: Circle System Utilized Preoxygenation: Pre-oxygenation with 100% oxygen Intubation Type: IV induction Ventilation: Mask ventilation without difficulty LMA: LMA inserted LMA Size: 4.0 Number of attempts: 1 Airway Equipment and Method: bite block Placement Confirmation: positive ETCO2 Tube secured with: Tape Dental Injury: Teeth and Oropharynx as per pre-operative assessment

## 2014-02-04 NOTE — Op Note (Addendum)
Dictation Number [id number]  Intra-operative fluoroscopic images in the AP, lateral, and oblique views were taken and evaluated by myself.  Reduction and hardware placement were confirmed.  There was good intraarticular compression by thef permanent hardware.

## 2014-02-04 NOTE — Anesthesia Preprocedure Evaluation (Signed)
Anesthesia Evaluation  Patient identified by MRN, date of birth, ID band Patient awake    Reviewed: Allergy & Precautions, H&P , NPO status , Patient's Chart, lab work & pertinent test results  History of Anesthesia Complications (+) PONV  Airway Mallampati: I  TM Distance: >3 FB Neck ROM: Full    Dental  (+) Partial Lower, Teeth Intact, Dental Advisory Given   Pulmonary former smoker,  breath sounds clear to auscultation        Cardiovascular Rhythm:Regular Rate:Normal     Neuro/Psych    GI/Hepatic GERD-  Medicated and Controlled,  Endo/Other    Renal/GU      Musculoskeletal   Abdominal   Peds  Hematology   Anesthesia Other Findings   Reproductive/Obstetrics                             Anesthesia Physical Anesthesia Plan  ASA: II  Anesthesia Plan: MAC and Bier Block   Post-op Pain Management:    Induction: Intravenous  Airway Management Planned: Simple Face Mask  Additional Equipment:   Intra-op Plan:   Post-operative Plan:   Informed Consent: I have reviewed the patients History and Physical, chart, labs and discussed the procedure including the risks, benefits and alternatives for the proposed anesthesia with the patient or authorized representative who has indicated his/her understanding and acceptance.   Dental advisory given  Plan Discussed with: CRNA, Anesthesiologist and Surgeon  Anesthesia Plan Comments:         Anesthesia Quick Evaluation

## 2014-02-04 NOTE — Transfer of Care (Signed)
Immediate Anesthesia Transfer of Care Note  Patient: [name]  Procedure(s) Performed: Procedure(s): FUSION DISTAL INTERPHALANGEAL JOINT RIGHT INDEX FINGER  (Right)  Patient Location: PACU  Anesthesia Type:General  Level of Consciousness: sedated  Airway & Oxygen Therapy: Patient Spontanous Breathing and Patient connected to face mask oxygen  Post-op Assessment: Report given to PACU RN and Post -op Vital signs reviewed and stable  Post vital signs: Reviewed and stable  Complications: No apparent anesthesia complications

## 2014-02-06 ENCOUNTER — Encounter (HOSPITAL_BASED_OUTPATIENT_CLINIC_OR_DEPARTMENT_OTHER): Payer: Self-pay | Admitting: Orthopedic Surgery

## 2014-05-09 ENCOUNTER — Other Ambulatory Visit: Payer: Self-pay

## 2014-05-09 DIAGNOSIS — Z1231 Encounter for screening mammogram for malignant neoplasm of breast: Secondary | ICD-10-CM

## 2014-06-10 ENCOUNTER — Ambulatory Visit
Admission: RE | Admit: 2014-06-10 | Discharge: 2014-06-10 | Disposition: A | Discharge status: Home / Self Care | Payer: Medicare Other | Source: Ambulatory Visit

## 2014-06-10 DIAGNOSIS — Z1231 Encounter for screening mammogram for malignant neoplasm of breast: Secondary | ICD-10-CM

## 2014-06-13 ENCOUNTER — Other Ambulatory Visit: Payer: Self-pay

## 2014-06-13 ENCOUNTER — Ambulatory Visit
Admission: RE | Admit: 2014-06-13 | Discharge: 2014-06-13 | Disposition: A | Discharge status: Home / Self Care | Payer: Medicare Other | Source: Ambulatory Visit

## 2014-06-13 DIAGNOSIS — N6310 Unspecified lump in the right breast, unspecified quadrant: Secondary | ICD-10-CM

## 2014-06-13 DIAGNOSIS — Z1231 Encounter for screening mammogram for malignant neoplasm of breast: Secondary | ICD-10-CM

## 2014-09-30 ENCOUNTER — Other Ambulatory Visit: Payer: Self-pay | Admitting: Internal Medicine

## 2014-09-30 DIAGNOSIS — K219 Gastro-esophageal reflux disease without esophagitis: Secondary | ICD-10-CM

## 2014-10-02 ENCOUNTER — Ambulatory Visit
Admission: RE | Admit: 2014-10-02 | Discharge: 2014-10-02 | Disposition: A | Discharge status: Home / Self Care | Payer: Medicare Other | Source: Ambulatory Visit | Attending: Internal Medicine | Admitting: Internal Medicine

## 2014-10-02 DIAGNOSIS — K219 Gastro-esophageal reflux disease without esophagitis: Secondary | ICD-10-CM

## 2015-05-11 ENCOUNTER — Other Ambulatory Visit: Payer: Self-pay

## 2015-05-11 DIAGNOSIS — Z1231 Encounter for screening mammogram for malignant neoplasm of breast: Secondary | ICD-10-CM

## 2015-06-11 ENCOUNTER — Ambulatory Visit
Admission: RE | Admit: 2015-06-11 | Discharge: 2015-06-11 | Disposition: A | Discharge status: Home / Self Care | Payer: Medicare Other | Source: Ambulatory Visit

## 2015-06-11 DIAGNOSIS — Z1231 Encounter for screening mammogram for malignant neoplasm of breast: Secondary | ICD-10-CM

## 2015-07-22 ENCOUNTER — Encounter (HOSPITAL_BASED_OUTPATIENT_CLINIC_OR_DEPARTMENT_OTHER): Payer: Self-pay | Admitting: *Deleted

## 2015-07-26 NOTE — Anesthesia Preprocedure Evaluation (Addendum)
Anesthesia Evaluation  Patient identified by MRN, date of birth, ID band Patient awake    Reviewed: Allergy & Precautions, NPO status , Patient's Chart, lab work & pertinent test results  History of Anesthesia Complications (+) PONV  Airway Mallampati: II  TM Distance: >3 FB Neck ROM: Full    Dental  (+) Dental Advisory Given   Pulmonary former smoker,    breath sounds clear to auscultation       Cardiovascular negative cardio ROS   Rhythm:Regular Rate:Normal     Neuro/Psych negative neurological ROS     GI/Hepatic Neg liver ROS, GERD  ,  Endo/Other  Hypothyroidism   Renal/GU negative Renal ROS     Musculoskeletal  (+) Arthritis ,   Abdominal   Peds  Hematology negative hematology ROS (+)   Anesthesia Other Findings   Reproductive/Obstetrics                            Anesthesia Physical Anesthesia Plan  ASA: II  Anesthesia Plan: General   Post-op Pain Management:    Induction: Intravenous  Airway Management Planned: LMA  Additional Equipment:   Intra-op Plan:   Post-operative Plan: Extubation in OR  Informed Consent: I have reviewed the patients History and Physical, chart, labs and discussed the procedure including the risks, benefits and alternatives for the proposed anesthesia with the patient or authorized representative who has indicated his/her understanding and acceptance.   Dental advisory given  Plan Discussed with: CRNA  Anesthesia Plan Comments:        Anesthesia Quick Evaluation

## 2015-07-27 ENCOUNTER — Encounter (HOSPITAL_BASED_OUTPATIENT_CLINIC_OR_DEPARTMENT_OTHER): Payer: Self-pay | Admitting: *Deleted

## 2015-07-27 ENCOUNTER — Encounter (HOSPITAL_BASED_OUTPATIENT_CLINIC_OR_DEPARTMENT_OTHER)
Admission: RE | Disposition: A | Discharge status: Home / Self Care | Payer: Self-pay | Source: Ambulatory Visit | Attending: Specialist

## 2015-07-27 ENCOUNTER — Ambulatory Visit (HOSPITAL_BASED_OUTPATIENT_CLINIC_OR_DEPARTMENT_OTHER): Payer: Medicare Other | Admitting: Anesthesiology

## 2015-07-27 ENCOUNTER — Ambulatory Visit (HOSPITAL_BASED_OUTPATIENT_CLINIC_OR_DEPARTMENT_OTHER)
Admission: RE | Admit: 2015-07-27 | Discharge: 2015-07-27 | Disposition: A | Discharge status: Home / Self Care | Payer: Medicare Other | Source: Ambulatory Visit | Attending: Specialist | Admitting: Specialist

## 2015-07-27 DIAGNOSIS — E039 Hypothyroidism, unspecified: Secondary | ICD-10-CM | POA: Insufficient documentation

## 2015-07-27 DIAGNOSIS — H02403 Unspecified ptosis of bilateral eyelids: Secondary | ICD-10-CM | POA: Insufficient documentation

## 2015-07-27 DIAGNOSIS — Z87891 Personal history of nicotine dependence: Secondary | ICD-10-CM | POA: Insufficient documentation

## 2015-07-27 HISTORY — PX: PTOSIS REPAIR: SHX6568

## 2015-07-27 HISTORY — DX: Dizziness and giddiness: R42

## 2015-07-27 HISTORY — DX: Unspecified ptosis of bilateral eyelids: H02.403

## 2015-07-27 LAB — POCT I-STAT, CHEM 8
BUN: 14 mg/dL (ref 6–20)
Calcium, Ion: 1.17 mmol/L (ref 1.13–1.30)
Chloride: 101 mmol/L (ref 101–111)
Creatinine, Ser: 0.7 mg/dL (ref 0.44–1.00)
Glucose, Bld: 114 mg/dL — ABNORMAL HIGH (ref 65–99)
HCT: 41 % (ref 36.0–46.0)
Hemoglobin: 13.9 g/dL (ref 12.0–15.0)
Potassium: 3.8 mmol/L (ref 3.5–5.1)
Sodium: 137 mmol/L (ref 135–145)
TCO2: 25 mmol/L (ref 0–100)

## 2015-07-27 SURGERY — REPAIR, BLEPHAROPTOSIS
Anesthesia: General | Site: Eye | Laterality: Bilateral

## 2015-07-27 MED ORDER — ACETAMINOPHEN 325 MG PO TABS
ORAL_TABLET | ORAL | Status: AC
  Filled 2015-07-27: qty 1

## 2015-07-27 MED ORDER — CIPROFLOXACIN IN D5W 400 MG/200ML IV SOLN
INTRAVENOUS | Status: AC
  Filled 2015-07-27: qty 200

## 2015-07-27 MED ORDER — MIDAZOLAM HCL 2 MG/2ML IJ SOLN: 1.0000 mg | INTRAMUSCULAR | Status: DC | PRN

## 2015-07-27 MED ORDER — LIDOCAINE HCL (CARDIAC) 20 MG/ML IV SOLN
INTRAVENOUS | Status: DC | PRN
  Administered 2015-07-27: 120 mg via INTRAVENOUS
  Administered 2015-07-27: 80 mg via INTRAVENOUS

## 2015-07-27 MED ORDER — ONDANSETRON HCL 4 MG/2ML IJ SOLN
INTRAMUSCULAR | Status: DC | PRN
  Administered 2015-07-27: 4 mg via INTRAVENOUS

## 2015-07-27 MED ORDER — FENTANYL CITRATE (PF) 100 MCG/2ML IJ SOLN: 25.0000 ug | INTRAMUSCULAR | Status: DC | PRN

## 2015-07-27 MED ORDER — CIPROFLOXACIN IN D5W 400 MG/200ML IV SOLN
400.0000 mg | INTRAVENOUS | Status: AC
  Administered 2015-07-27: 400 mg via INTRAVENOUS

## 2015-07-27 MED ORDER — LACTATED RINGERS IV SOLN
INTRAVENOUS | Status: DC
  Administered 2015-07-27: 07:00:00 via INTRAVENOUS

## 2015-07-27 MED ORDER — ONDANSETRON HCL 4 MG/2ML IJ SOLN
INTRAMUSCULAR | Status: AC
  Filled 2015-07-27: qty 2

## 2015-07-27 MED ORDER — BSS IO SOLN
INTRAOCULAR | Status: AC
  Filled 2015-07-27: qty 15

## 2015-07-27 MED ORDER — FENTANYL CITRATE (PF) 100 MCG/2ML IJ SOLN
50.0000 ug | INTRAMUSCULAR | Status: DC | PRN
  Administered 2015-07-27: 50 ug via INTRAVENOUS

## 2015-07-27 MED ORDER — PROMETHAZINE HCL 25 MG/ML IJ SOLN: 6.2500 mg | INTRAMUSCULAR | Status: DC | PRN

## 2015-07-27 MED ORDER — ACETAMINOPHEN 325 MG PO TABS
325.0000 mg | ORAL_TABLET | Freq: Once | ORAL | Status: AC
  Administered 2015-07-27: 325 mg via ORAL

## 2015-07-27 MED ORDER — SCOPOLAMINE 1 MG/3DAYS TD PT72: 1.0000 | MEDICATED_PATCH | Freq: Once | TRANSDERMAL | Status: DC | PRN

## 2015-07-27 MED ORDER — LIDOCAINE-EPINEPHRINE (PF) 1 %-1:200000 IJ SOLN
INTRAMUSCULAR | Status: AC
  Filled 2015-07-27: qty 30

## 2015-07-27 MED ORDER — GLYCOPYRROLATE 0.2 MG/ML IJ SOLN: 0.2000 mg | Freq: Once | INTRAMUSCULAR | Status: DC | PRN

## 2015-07-27 MED ORDER — PROPOFOL 10 MG/ML IV BOLUS
INTRAVENOUS | Status: AC
  Filled 2015-07-27: qty 20

## 2015-07-27 MED ORDER — PROPOFOL 500 MG/50ML IV EMUL
INTRAVENOUS | Status: DC | PRN
  Administered 2015-07-27: 150 ug/kg/min via INTRAVENOUS

## 2015-07-27 MED ORDER — FENTANYL CITRATE (PF) 100 MCG/2ML IJ SOLN
INTRAMUSCULAR | Status: AC
  Filled 2015-07-27: qty 2

## 2015-07-27 MED ORDER — HYDROCODONE-ACETAMINOPHEN 7.5-325 MG PO TABS: 1.0000 | ORAL_TABLET | Freq: Once | ORAL | Status: DC | PRN

## 2015-07-27 MED ORDER — ARTIFICIAL TEARS OP OINT
TOPICAL_OINTMENT | OPHTHALMIC | Status: AC
  Filled 2015-07-27: qty 3.5

## 2015-07-27 MED ORDER — LIDOCAINE HCL (CARDIAC) 20 MG/ML IV SOLN
INTRAVENOUS | Status: AC
  Filled 2015-07-27: qty 5

## 2015-07-27 MED ORDER — LIDOCAINE-EPINEPHRINE 0.5 %-1:200000 IJ SOLN
INTRAMUSCULAR | Status: DC | PRN
  Administered 2015-07-27: 8 mL

## 2015-07-27 MED ORDER — BSS IO SOLN
INTRAOCULAR | Status: DC | PRN
  Administered 2015-07-27: 7 mL

## 2015-07-27 MED ORDER — PROPOFOL 500 MG/50ML IV EMUL
INTRAVENOUS | Status: AC
  Filled 2015-07-27: qty 50

## 2015-07-27 MED ORDER — BACITRACIN-POLYMYXIN B 500-10000 UNIT/GM OP OINT
TOPICAL_OINTMENT | OPHTHALMIC | Status: DC | PRN
  Administered 2015-07-27: 1 via OPHTHALMIC

## 2015-07-27 MED ORDER — EPHEDRINE SULFATE 50 MG/ML IJ SOLN
INTRAMUSCULAR | Status: AC
  Filled 2015-07-27: qty 1

## 2015-07-27 MED ORDER — DEXAMETHASONE SODIUM PHOSPHATE 10 MG/ML IJ SOLN
INTRAMUSCULAR | Status: AC
  Filled 2015-07-27: qty 1

## 2015-07-27 MED ORDER — LIDOCAINE-EPINEPHRINE 0.5 %-1:200000 IJ SOLN
INTRAMUSCULAR | Status: AC
  Filled 2015-07-27: qty 4

## 2015-07-27 MED ORDER — BACITRACIN-POLYMYXIN B 500-10000 UNIT/GM OP OINT
TOPICAL_OINTMENT | OPHTHALMIC | Status: AC
  Filled 2015-07-27: qty 3.5

## 2015-07-27 MED ORDER — DEXAMETHASONE SODIUM PHOSPHATE 4 MG/ML IJ SOLN
INTRAMUSCULAR | Status: DC | PRN
  Administered 2015-07-27: 10 mg via INTRAVENOUS

## 2015-07-27 SURGICAL SUPPLY — 39 items
APPLICATOR COTTON TIP 6IN STRL (MISCELLANEOUS) ×2 IMPLANT
BLADE KNIFE PERSONA 15 (BLADE) ×2 IMPLANT
COVER BACK TABLE 60X90IN (DRAPES) ×2 IMPLANT
COVER MAYO STAND STRL (DRAPES) ×2 IMPLANT
DECANTER SPIKE VIAL GLASS SM (MISCELLANEOUS) ×2 IMPLANT
DRAPE U-SHAPE 76X120 STRL (DRAPES) ×2 IMPLANT
ELECT NEEDLE BLADE 2-5/6 (NEEDLE) ×2 IMPLANT
ELECT REM PT RETURN 9FT ADLT (ELECTROSURGICAL) ×2
ELECTRODE REM PT RTRN 9FT ADLT (ELECTROSURGICAL) ×1 IMPLANT
GAUZE SPONGE 4X4 16PLY XRAY LF (GAUZE/BANDAGES/DRESSINGS) IMPLANT
GLOVE BIO SURGEON STRL SZ 6.5 (GLOVE) ×2 IMPLANT
GLOVE BIOGEL M STRL SZ7.5 (GLOVE) ×2 IMPLANT
GLOVE BIOGEL PI IND STRL 8 (GLOVE) ×1 IMPLANT
GLOVE BIOGEL PI INDICATOR 8 (GLOVE) ×1
GLOVE ECLIPSE 7.0 STRL STRAW (GLOVE) ×4 IMPLANT
GOWN STRL REUS W/ TWL LRG LVL3 (GOWN DISPOSABLE) ×1 IMPLANT
GOWN STRL REUS W/ TWL XL LVL3 (GOWN DISPOSABLE) ×1 IMPLANT
GOWN STRL REUS W/TWL LRG LVL3 (GOWN DISPOSABLE) ×1
GOWN STRL REUS W/TWL XL LVL3 (GOWN DISPOSABLE) ×1
MARKER SKIN DUAL TIP RULER LAB (MISCELLANEOUS) ×2 IMPLANT
NEEDLE PRECISIONGLIDE 27X1.5 (NEEDLE) ×2 IMPLANT
PACK BASIN DAY SURGERY FS (CUSTOM PROCEDURE TRAY) ×2 IMPLANT
PENCIL BUTTON HOLSTER BLD 10FT (ELECTRODE) ×2 IMPLANT
SHEILD EYE MED CORNL SHD 22X21 (OPHTHALMIC RELATED)
SHIELD EYE MED CORNL SHD 22X21 (OPHTHALMIC RELATED) IMPLANT
SLEEVE SCD COMPRESS KNEE MED (MISCELLANEOUS) ×2 IMPLANT
SPONGE GAUZE 4X4 12PLY STER LF (GAUZE/BANDAGES/DRESSINGS) IMPLANT
STRIP CLOSURE SKIN 1/8X3 (GAUZE/BANDAGES/DRESSINGS) IMPLANT
SUCTION FRAZIER HANDLE 10FR (MISCELLANEOUS) ×1
SUCTION TUBE FRAZIER 10FR DISP (MISCELLANEOUS) ×1 IMPLANT
SUT ETHILON 5 0 P 3 18 (SUTURE)
SUT MERSILENE 5 0 P 3 (SUTURE) ×2 IMPLANT
SUT NYLON ETHILON 5-0 P-3 1X18 (SUTURE) IMPLANT
SUT SILK 4 0 P 3 (SUTURE) IMPLANT
SUT SILK 6 0 P 1 (SUTURE) ×2 IMPLANT
SYR CONTROL 10ML LL (SYRINGE) ×2 IMPLANT
TOWEL OR 17X24 6PK STRL BLUE (TOWEL DISPOSABLE) IMPLANT
TUBE CONNECTING 20X1/4 (TUBING) ×2 IMPLANT
UNDERPAD 30X30 (UNDERPADS AND DIAPERS) ×2 IMPLANT

## 2015-07-27 NOTE — Anesthesia Procedure Notes (Signed)
Procedure Name: LMA Insertion Performed by: Terrance Mass Pre-anesthesia Checklist: Patient identified, Emergency Drugs available, Suction available and Patient being monitored Patient Re-evaluated:Patient Re-evaluated prior to inductionOxygen Delivery Method: Circle System Utilized Preoxygenation: Pre-oxygenation with 100% oxygen Intubation Type: IV induction Ventilation: Mask ventilation without difficulty LMA: LMA inserted LMA Size: 4.0 Number of attempts: 1 Placement Confirmation: positive ETCO2 Tube secured with: Tape Dental Injury: Teeth and Oropharynx as per pre-operative assessment

## 2015-07-27 NOTE — Anesthesia Postprocedure Evaluation (Signed)
Anesthesia Post Note  Patient: [name]  Procedure(s) Performed: Procedure(s) (LRB): PTOSIS REPAIR (Bilateral)  Patient location during evaluation: PACU Anesthesia Type: General Level of consciousness: awake and alert Pain management: pain level controlled Vital Signs Assessment: post-procedure vital signs reviewed and stable Respiratory status: spontaneous breathing, nonlabored ventilation and respiratory function stable Cardiovascular status: blood pressure returned to baseline and stable Postop Assessment: no signs of nausea or vomiting Anesthetic complications: no    Last Vitals:  Filed Vitals:   07/27/15 0915 07/27/15 1010  BP: 147/70 149/88  Pulse: 62 60  Temp:  36.1 C  Resp: 19 18    Last Pain:  Filed Vitals:   07/27/15 1021  PainSc: 3                  Tiajuana Amass

## 2015-07-27 NOTE — Transfer of Care (Signed)
Immediate Anesthesia Transfer of Care Note  Patient: [name]  Procedure(s) Performed: Procedure(s): PTOSIS REPAIR (Bilateral)  Patient Location: PACU  Anesthesia Type:General  Level of Consciousness: awake and sedated  Airway & Oxygen Therapy: Patient Spontanous Breathing and Patient connected to face mask oxygen  Post-op Assessment: Report given to RN and Post -op Vital signs reviewed and stable  Post vital signs: Reviewed and stable  Last Vitals:  Filed Vitals:   07/27/15 0700  BP: 131/68  Pulse: 85  Temp: 37 C  Resp: 20    Complications: No apparent anesthesia complications

## 2015-07-27 NOTE — Discharge Instructions (Signed)
Activity (include date of return to work if known) °As tolerated: NO showers °NO driving °No heavy activities ° °Diet:regular No restrictions: ° °Wound Care: Keep dressing clean & dry ° °Do not change dressings °For Abdominoplasties wear abdominal binder °Special Instructions: °Do not raise arms up °Continue to empty, recharge, & record drainage from J-P drains &/or °Hemovacs 2-3 times a day, as needed. °Call Doctor if any unusual problems occur such as pain, excessive °Bleeding, unrelieved Nausea/vomiting, Fever &/or chills °When lying down, keep head elevated on 2-3 pillows or back-rest °For Addominoplasties the Jack-knife position °Follow-up appointment: Please call the office. ° °The patient received discharge instruction from:___________________________________________ ° ° °Patient signature ________________________________________ / Date___________ ° ° ° °Signature of individual providing instructions/ Date________________     ° ° °Post Anesthesia Home Care Instructions ° °Activity: °Get plenty of rest for the remainder of the day. A responsible adult should stay with you for 24 hours following the procedure.  °For the next 24 hours, DO NOT: °-Drive a car °-Operate machinery °-Drink alcoholic beverages °-Take any medication unless instructed by your physician °-Make any legal decisions or sign important papers. ° °Meals: °Start with liquid foods such as gelatin or soup. Progress to regular foods as tolerated. Avoid greasy, spicy, heavy foods. If nausea and/or vomiting occur, drink only clear liquids until the nausea and/or vomiting subsides. Call your physician if vomiting continues. ° °Special Instructions/Symptoms: °Your throat may feel dry or sore from the anesthesia or the breathing tube placed in your throat during surgery. If this causes discomfort, gargle with warm salt water. The discomfort should disappear within 24 hours. ° °If you had a scopolamine patch placed behind your ear for the management of  post- operative nausea and/or vomiting: ° °1. The medication in the patch is effective for 72 hours, after which it should be removed.  Wrap patch in a tissue and discard in the trash. Wash hands thoroughly with soap and water. °2. You may remove the patch earlier than 72 hours if you experience unpleasant side effects which may include dry mouth, dizziness or visual disturbances. °3. Avoid touching the patch. Wash your hands with soap and water after contact with the patch. °  °         °

## 2015-07-27 NOTE — Brief Op Note (Signed)
07/27/2015  8:29 AM  PATIENT:  [name]  82 y.o. female  PRE-OPERATIVE DIAGNOSIS:  upper eye lid muscle disease  POST-OPERATIVE DIAGNOSIS:  upper eye lid muscle disease  PROCEDURE:  Procedure(s): PTOSIS REPAIR (Bilateral)  SURGEON:  Surgeon(s) and Role:    * Cristine Polio, MD - Primary  PHYSICIAN ASSISTANT:   ASSISTANTS: none   ANESTHESIA:   general  EBL:  Total I/O In: 1300 [I.V.:1300] Out: 5 [Blood:5]  BLOOD ADMINISTERED:none  DRAINS: none   LOCAL MEDICATIONS USED:  XYLOCAINE   SPECIMEN:  Excision  DISPOSITION OF SPECIMEN:  PATHOLOGY  COUNTS:  YES  TOURNIQUET:  * No tourniquets in log *  DICTATION: .Other Dictation: Dictation Number        [id number]  PLAN OF CARE: Discharge to home after PACU  PATIENT DISPOSITION:  PACU - hemodynamically stable.   Delay start of Pharmacological VTE agent (>24hrs) due to surgical blood loss or risk of bleeding: yes

## 2015-07-27 NOTE — H&P (Signed)
[name] is an 82 y.o. female.   Chief Complaint: Ptosis upper lids with visional obstruction HPI: Pt has to lift head to see well  Lateral and upward vision obstructed and verifiesd by visional fields HX OF MYASTHENIA GRAVIS  Past Medical History  Diagnosis Date  . PONV (postoperative nausea and vomiting)   . Arthritis   . GERD (gastroesophageal reflux disease)   . Wears glasses     reading  . Hypothyroidism   . Vertigo   . Ptosis of both eyelids     Past Surgical History  Procedure Laterality Date  . Knee surgery  2009,2011    partial knee  . Shoulder surgery      rt rcr,and lt  . Back surgery  1972    lumb lam  . Tonsillectomy    . Abdominal hysterectomy  1986  . Carpal tunnel release  2000    right  . Carpal tunnel release  2012    left  . Cystocele repair  2007    sling  . Colonoscopy    . Open reduction internal fixation (orif) proximal phalanx Left 08/30/2013    Procedure: OPEN REDUCTION INTERNAL FIXATION (ORIF) PROXIMAL PHALANX FRACTURE LEFT SMALL FINGER; SPLINT RING FINGER;  Surgeon: Wynonia Sours, MD;  Location: [facility];  Service: Orthopedics;  Laterality: Left;  Marland Kitchen Eye surgery    . Distal interphalangeal joint fusion Right 02/04/2014    Procedure: FUSION DISTAL INTERPHALANGEAL JOINT RIGHT INDEX FINGER ;  Surgeon: Daryll Brod, MD;  Location: [facility];  Service: Orthopedics;  Laterality: Right;    History reviewed. No pertinent family history. Social History:  reports that she has quit smoking. She has never used smokeless tobacco. She reports that she does not drink alcohol or use illicit drugs.  Allergies:  Allergies  Allergen Reactions  . Keflex [Cephalexin] Hives    Medications Prior to Admission  Medication Sig Dispense Refill  . calcium carbonate (OS-CAL) 600 MG TABS Take 600 mg by mouth daily.    Marland Kitchen levothyroxine (SYNTHROID, LEVOTHROID) 50 MCG tablet Take 50 mcg by mouth daily.    . meclizine (ANTIVERT) 25 MG  tablet Take 25 mg by mouth 3 (three) times daily as needed for dizziness.    . Misc Natural Products (OSTEO BI-FLEX ADV JOINT SHIELD PO) Take by mouth.    . Misc Natural Products (TURMERIC CURCUMIN) CAPS Take by mouth.    . Multiple Vitamin (MULTIVITAMIN WITH MINERALS) TABS Take 1 tablet by mouth daily.    . Nutritional Supplements (GRAPESEED EXTRACT PO) Take by mouth.    Marland Kitchen omeprazole (PRILOSEC) 20 MG capsule Take 20 mg by mouth daily.    . vitamin C (ASCORBIC ACID) 500 MG tablet Take 500 mg by mouth daily.      No results found for this or any previous visit (from the past 48 hour(s)). No results found.  Review of Systems  Constitutional: Negative.   HENT: Negative.   Eyes: Negative.   Respiratory: Negative.   Cardiovascular: Negative.   Gastrointestinal: Negative.   Genitourinary: Negative.   Musculoskeletal: Negative.   Neurological: Negative.   Endo/Heme/Allergies: Negative.   Psychiatric/Behavioral: Negative.     Height 5\' 6"  (1.676 m), weight 81.647 kg (180 lb). Physical Exam   Assessment/Plan Ptosis right and left upper lids for bilateral upper lid ptosis repair  Aundre Hietala L, MD 07/27/2015, 6:54 AM

## 2015-07-28 ENCOUNTER — Encounter (HOSPITAL_BASED_OUTPATIENT_CLINIC_OR_DEPARTMENT_OTHER): Payer: Self-pay | Admitting: Specialist

## 2015-07-28 NOTE — Addendum Note (Signed)
Addendum  created 07/28/15 0831 by Tawni Millers, CRNA   Modules edited: Charges VN

## 2015-07-28 NOTE — Op Note (Signed)
NAME:  [name], [name] NO.:  MEDICAL RECORD NO.:  [id number]  LOCATION:                                 FACILITY:  PHYSICIAN:  Maille Halliwell L. Towanda Malkin, M.D.   DATE OF BIRTH:  DATE OF PROCEDURE: DATE OF DISCHARGE:                              OPERATIVE REPORT   This is an 82 year old lady with ptosis of on the right and left upper eye lid areas, left side greater than the right.  The patient had conservative treatment, eye muscle exercises and so, which failed.  PROCEDURE PLANNED:  Bilateral ptosis repairs and reconstruction.  ANESTHESIA:  General.  DESCRIPTION OF PROCEDURE:  Preoperatively, the patient underwent drawings for the excision of the skin of the upper lids.  After undergoing general anesthesia, the face was prepped with Betadine, soap and solution, protected the eyes, walled off with sterile towels, and drapes so as to make a sterile field.  The eyes were protected with Lacri-Lube.  The markings were made.  Local anesthesia, 0.5% Xylocaine with epinephrine was injected locally down to underlying skin, dermis. Incisions were made of the elliptical excision, it was exposed, patient was placed in semi-sitting position.  Dissection was carried down to the septum orbitale to underlying orbicularis, oculus levator on each side. The excess muscle was removed on the left side.  The repair was done first, multiple sutures of 5-0 [place] to do an adequate lift above the pupil.  Same was done on the right side, approximately 4 sutures. Next the skin was reapproximated with a running subcuticular stitch of 5- 0 nylon and the lateral portion with a 6-0 silk.  All the wounds were covered with sterile dressings and compresses moist.  She tolerated the procedures very very well, taken to recovery in excellent condition.  ESTIMATED BLOOD LOSS:  Less than 10 mL.  COMPLICATIONS:  None.     Odella Aquas. Towanda Malkin, M.D.     Elie Confer  D:  07/27/2015  T:   07/27/2015  Job:  [id number]

## 2016-02-02 ENCOUNTER — Other Ambulatory Visit: Payer: Self-pay | Admitting: Internal Medicine

## 2016-02-02 ENCOUNTER — Ambulatory Visit
Admission: RE | Admit: 2016-02-02 | Discharge: 2016-02-02 | Disposition: A | Discharge status: Home / Self Care | Payer: Medicare Other | Source: Ambulatory Visit | Attending: Internal Medicine | Admitting: Internal Medicine

## 2016-02-02 DIAGNOSIS — R0781 Pleurodynia: Secondary | ICD-10-CM

## 2016-05-19 ENCOUNTER — Other Ambulatory Visit: Payer: Self-pay | Admitting: Internal Medicine

## 2016-05-19 DIAGNOSIS — Z1231 Encounter for screening mammogram for malignant neoplasm of breast: Secondary | ICD-10-CM

## 2016-06-20 ENCOUNTER — Ambulatory Visit: Payer: Medicare Other

## 2016-06-27 ENCOUNTER — Encounter: Payer: Self-pay | Admitting: Physical Therapy

## 2016-06-27 ENCOUNTER — Ambulatory Visit: Payer: Medicare Other | Attending: Sports Medicine | Admitting: Physical Therapy

## 2016-06-27 DIAGNOSIS — R262 Difficulty in walking, not elsewhere classified: Secondary | ICD-10-CM | POA: Diagnosis present

## 2016-06-27 DIAGNOSIS — M25561 Pain in right knee: Secondary | ICD-10-CM | POA: Insufficient documentation

## 2016-06-27 DIAGNOSIS — R2241 Localized swelling, mass and lump, right lower limb: Secondary | ICD-10-CM

## 2016-06-27 NOTE — Therapy (Signed)
[address] [place], [place], [address] Phone: [phone]   Fax:  [phone]  Physical Therapy Evaluation  Patient Details  Name: [name] MRN: [id number] Date of Birth: 1932-11-18 Referring Provider: Robby Sermon  Encounter Date: 06/27/2016      PT End of Session - 06/27/16 1423    Visit Number 1   Date for PT Re-Evaluation 08/27/16   PT Start Time 3846   PT Stop Time 1439   PT Time Calculation (min) 42 min   Activity Tolerance Patient tolerated treatment well   Behavior During Therapy [facility] for tasks assessed/performed      Past Medical History:  Diagnosis Date  . Arthritis   . GERD (gastroesophageal reflux disease)   . Hypothyroidism   . PONV (postoperative nausea and vomiting)   . Ptosis of both eyelids   . Vertigo   . Wears glasses    reading    Past Surgical History:  Procedure Laterality Date  . ABDOMINAL HYSTERECTOMY  1986  . [place]   right  . CARPAL TUNNEL RELEASE  2012   left  . COLONOSCOPY    . CYSTOCELE REPAIR  2007   sling  . DISTAL INTERPHALANGEAL JOINT FUSION Right 02/04/2014   Procedure: FUSION DISTAL INTERPHALANGEAL JOINT RIGHT INDEX FINGER ;  Surgeon: Daryll Brod, MD;  Location: [facility];  Service: Orthopedics;  Laterality: Right;  . EYE SURGERY    . KNEE SURGERY  2009,2011   partial knee  . OPEN REDUCTION INTERNAL FIXATION (ORIF) PROXIMAL PHALANX Left 08/30/2013   Procedure: OPEN REDUCTION INTERNAL FIXATION (ORIF) PROXIMAL PHALANX FRACTURE LEFT SMALL FINGER; SPLINT RING FINGER;  Surgeon: Wynonia Sours, MD;  Location: [facility];  Service: Orthopedics;  Laterality: Left;  . PTOSIS REPAIR Bilateral 07/27/2015   Procedure: PTOSIS REPAIR;  Surgeon: Cristine Polio, MD;  Location: [facility];  Service: Plastics;  Laterality: Bilateral;  . SHOULDER SURGERY     rt rcr,and  lt  . TONSILLECTOMY      There were no vitals filed for this visit.       Subjective Assessment - 06/27/16 1401    Subjective Patient reports that she underwent a right knee scope on 06/08/14.  She reports that the knee has been draining since that time, she reports that she has called the MD office about this.  She report sthat she had a debridement of "floaters" in the knee, reports that she would have times when the knee would catch and cause severe pain   Limitations Standing;Walking   Patient Stated Goals have less pain   Currently in Pain? Yes   Pain Score 7    Pain Location Knee   Pain Orientation Right;Lateral   Pain Descriptors / Indicators Aching   Pain Type Surgical pain   Pain Onset 1 to 4 weeks ago   Pain Frequency Constant   Aggravating Factors  being up, walking, bending the pain will be up to 9/10   Pain Relieving Factors rest and ice pain will be 2-3/10   Effect of Pain on Daily Activities just pain and difficulty walking            [facility] PT Assessment - 06/27/16 0001      Assessment   Medical Diagnosis right knee scope   Referring Provider [place]   Onset Date/Surgical Date 06/07/16  Prior Therapy years ago     Precautions   Precautions None     Balance Screen   Has the patient fallen in the past 6 months No   Has the patient had a decrease in activity level because of a fear of falling?  No   Is the patient reluctant to leave their home because of a fear of falling?  No     Home Environment   Additional Comments has a split level home     Prior Function   Level of Independence Independent   Vocation Retired   Leisure no exercise     Observation/Other Assessments-Edema    Edema Circumferential     Circumferential Edema   Circumferential - Right 47.5 cm at mid patella   Circumferential - Left  45 cm     ROM / Strength   AROM / PROM / Strength AROM;Strength     AROM   Overall AROM Comments AROM in sitting right knee 5-104 degrees flexion      Strength   Overall Strength Comments 4-/5 with some pain in the lateral knee and in the right buttock     Palpation   Palpation comment she is tender in the right buttock and the lateral knee , she is swollen      Ambulation/Gait   Gait Comments no device, stairs she does one at a time, antalgic on the right , decreased step length and stance phase                   [facility] Adult PT Treatment/Exercise - 06/27/16 0001      Modalities   Modalities Vasopneumatic     Vasopneumatic   Number Minutes Vasopneumatic  15 minutes   Vasopnuematic Location  Knee   Vasopneumatic Pressure Medium   Vasopneumatic Temperature  33                PT Education - 06/27/16 1423    Education provided Yes   Education Details QS, SAQ, knee flexion strentch   Person(s) Educated Patient   Methods Explanation;Demonstration;Handout   Comprehension Verbalized understanding          PT Short Term Goals - 06/27/16 1428      PT SHORT TERM GOAL #1   Title independent with initial HEP   Time 1   Period Weeks   Status New           PT Long Term Goals - 06/27/16 1428      PT LONG TERM GOAL #1   Title decrease pain 50%   Time 8   Period Weeks   Status New     PT LONG TERM GOAL #2   Title increase AROM to 0-115 degrees flexion   Time 8   Period Weeks   Status New     PT LONG TERM GOAL #3   Title decrease swelling to = that of the left leg   Time 8   Period Weeks   Status New     PT LONG TERM GOAL #4   Title go up and down stairs step over step   Time 8   Period Weeks   Status New               Plan - 06/27/16 1424    Clinical Impression Statement Patient underwent a right knee scope with debridement, she reports that she has had issues with swelling and draining, she reports that he MD knows this.  She reports that she is having right sciatic issues, she reports that she is seeing another therapy group for that.  Her ROM is 5-104 degrees flexion  cannot do stairs step over step.   Rehab Potential Good   PT Frequency 2x / week   PT Duration 6 weeks   PT Treatment/Interventions ADLs/Self Care Home Management;Cryotherapy;Electrical Stimulation;Gait training;Stair training;Functional mobility training;Patient/family education;Therapeutic exercise;Therapeutic activities;Manual techniques;Vasopneumatic Device   PT Next Visit Plan Patient with swelling issues and sciatic issues, could see 1x/week to address the swelling and help her gait as well as she has a high rating of pain   Consulted and Agree with Plan of Care Patient      Patient will benefit from skilled therapeutic intervention in order to improve the following deficits and impairments:  Abnormal gait, Decreased activity tolerance, Decreased mobility, Decreased strength, Increased edema, Decreased range of motion, Difficulty walking, Pain  Visit Diagnosis: Acute pain of right knee - Plan: PT plan of care cert/re-cert  Difficulty in walking, not elsewhere classified - Plan: PT plan of care cert/re-cert  Localized swelling, mass and lump, right lower limb - Plan: PT plan of care cert/re-cert      G-Codes - 14/78/29 1429    Functional Assessment Tool Used (Outpatient Only) foto 61% limitation   Functional Limitation Mobility: Walking and moving around   Mobility: Walking and Moving Around Current Status (F6213) At least 60 percent but less than 80 percent impaired, limited or restricted   Mobility: Walking and Moving Around Goal Status (Y8657) At least 40 percent but less than 60 percent impaired, limited or restricted       Problem List There are no active problems to display for this patient.   Sumner Boast., PT 06/27/2016, 2:44 PM  [place] [address] [address] [place], [place], [address] Phone: [phone]   Fax:  [phone]  Name: [name] MRN: [id number] Date of Birth: 1933-03-18

## 2016-07-04 ENCOUNTER — Ambulatory Visit: Payer: Medicare Other | Attending: Sports Medicine | Admitting: Physical Therapy

## 2016-07-04 ENCOUNTER — Encounter: Payer: Self-pay | Admitting: Physical Therapy

## 2016-07-04 DIAGNOSIS — M25561 Pain in right knee: Secondary | ICD-10-CM | POA: Insufficient documentation

## 2016-07-04 DIAGNOSIS — R262 Difficulty in walking, not elsewhere classified: Secondary | ICD-10-CM

## 2016-07-04 DIAGNOSIS — R2241 Localized swelling, mass and lump, right lower limb: Secondary | ICD-10-CM | POA: Insufficient documentation

## 2016-07-04 NOTE — Therapy (Signed)
[address] [place], [place], [address] Phone: [phone]   Fax:  [phone]  Physical Therapy Treatment  Patient Details  Name: [name] MRN: [id number] Date of Birth: 1933-01-14 Referring Provider: Robby Sermon  Encounter Date: 07/04/2016      PT End of Session - 07/04/16 0927    Visit Number 2   PT Start Time 0843   PT Stop Time 0942   PT Time Calculation (min) 59 min      Past Medical History:  Diagnosis Date  . Arthritis   . GERD (gastroesophageal reflux disease)   . Hypothyroidism   . PONV (postoperative nausea and vomiting)   . Ptosis of both eyelids   . Vertigo   . Wears glasses    reading    Past Surgical History:  Procedure Laterality Date  . ABDOMINAL HYSTERECTOMY  1986  . [place]   right  . CARPAL TUNNEL RELEASE  2012   left  . COLONOSCOPY    . CYSTOCELE REPAIR  2007   sling  . DISTAL INTERPHALANGEAL JOINT FUSION Right 02/04/2014   Procedure: FUSION DISTAL INTERPHALANGEAL JOINT RIGHT INDEX FINGER ;  Surgeon: Daryll Brod, MD;  Location: [facility];  Service: Orthopedics;  Laterality: Right;  . EYE SURGERY    . KNEE SURGERY  2009,2011   partial knee  . OPEN REDUCTION INTERNAL FIXATION (ORIF) PROXIMAL PHALANX Left 08/30/2013   Procedure: OPEN REDUCTION INTERNAL FIXATION (ORIF) PROXIMAL PHALANX FRACTURE LEFT SMALL FINGER; SPLINT RING FINGER;  Surgeon: Wynonia Sours, MD;  Location: [facility];  Service: Orthopedics;  Laterality: Left;  . PTOSIS REPAIR Bilateral 07/27/2015   Procedure: PTOSIS REPAIR;  Surgeon: Cristine Polio, MD;  Location: [facility];  Service: Plastics;  Laterality: Bilateral;  . SHOULDER SURGERY     rt rcr,and lt  . TONSILLECTOMY      There were no vitals filed for this visit.      Subjective Assessment - 07/04/16 0844    Subjective "Im doing good, that  drainage stopped Thursday"   Currently in Pain? No/denies   Pain Score 0-No pain                         [facility] Adult PT Treatment/Exercise - 07/04/16 0001      Exercises   Exercises Knee/Hip     Knee/Hip Exercises: Aerobic   Recumbent Bike L1 x4 min  seat 5   Nustep L3 LE only x6 min      Knee/Hip Exercises: Standing   Heel Raises 2 sets;15 reps;2 seconds   Forward Step Up 10 reps;Both;1 set     Knee/Hip Exercises: Seated   Long Arc Quad 2 sets;10 reps;Weights   Long Arc Quad Weight 3 lbs.   Marching Both;2 sets;Weights;10 reps   Marching Weights 3 lbs.   Hamstring Curl 2 sets;15 reps;Both   Hamstring Limitations red Tband    Sit to Sand 2 sets;10 reps;without UE support     Modalities   Modalities Vasopneumatic     Vasopneumatic   Number Minutes Vasopneumatic  15 minutes   Vasopnuematic Location  Knee   Vasopneumatic Pressure Medium   Vasopneumatic Temperature  33                  PT Short Term Goals - 06/27/16 1428  PT SHORT TERM GOAL #1   Title independent with initial HEP   Time 1   Period Weeks   Status New           PT Long Term Goals - 06/27/16 1428      PT LONG TERM GOAL #1   Title decrease pain 50%   Time 8   Period Weeks   Status New     PT LONG TERM GOAL #2   Title increase AROM to 0-115 degrees flexion   Time 8   Period Weeks   Status New     PT LONG TERM GOAL #3   Title decrease swelling to = that of the left leg   Time 8   Period Weeks   Status New     PT LONG TERM GOAL #4   Title go up and down stairs step over step   Time 8   Period Weeks   Status New               Plan - 07/04/16 1916    Clinical Impression Statement Pt tolerated an initial progression to exercises well evident by no subjective c/o increase pain. Pt reports that her drainage has stopped and pain has decreased overall. Pt can be hyper verbal at times requiring cues to be redirected. Reports that she was able to work  in the yard this past weekend.    Rehab Potential Good   PT Frequency 2x / week   PT Duration 6 weeks   PT Treatment/Interventions ADLs/Self Care Home Management;Cryotherapy;Electrical Stimulation;Gait training;Stair training;Functional mobility training;Patient/family education;Therapeutic exercise;Therapeutic activities;Manual techniques;Vasopneumatic Device   PT Next Visit Plan Progress as tolerated      Patient will benefit from skilled therapeutic intervention in order to improve the following deficits and impairments:  Abnormal gait, Decreased activity tolerance, Decreased mobility, Decreased strength, Increased edema, Decreased range of motion, Difficulty walking, Pain  Visit Diagnosis: Acute pain of right knee  Difficulty in walking, not elsewhere classified  Localized swelling, mass and lump, right lower limb     Problem List There are no active problems to display for this patient.   Scot Jun, PTA 07/04/2016, 9:32 AM  [address] [place], [place], [address] Phone: [phone]   Fax:  [phone]  Name: EMBRY MANRIQUE MRN: [id number] Date of Birth: [birth year]

## 2016-07-07 ENCOUNTER — Ambulatory Visit
Admission: RE | Admit: 2016-07-07 | Discharge: 2016-07-07 | Disposition: A | Discharge status: Home / Self Care | Payer: Medicare Other | Source: Ambulatory Visit | Attending: Internal Medicine | Admitting: Internal Medicine

## 2016-07-07 DIAGNOSIS — Z1231 Encounter for screening mammogram for malignant neoplasm of breast: Secondary | ICD-10-CM

## 2016-07-11 ENCOUNTER — Ambulatory Visit: Payer: Medicare Other | Admitting: Physical Therapy

## 2016-07-13 ENCOUNTER — Ambulatory Visit: Payer: Medicare Other | Admitting: Physical Therapy

## 2016-07-13 ENCOUNTER — Encounter: Payer: Self-pay | Admitting: Physical Therapy

## 2016-07-13 DIAGNOSIS — R262 Difficulty in walking, not elsewhere classified: Secondary | ICD-10-CM

## 2016-07-13 DIAGNOSIS — R2241 Localized swelling, mass and lump, right lower limb: Secondary | ICD-10-CM

## 2016-07-13 DIAGNOSIS — M25561 Pain in right knee: Secondary | ICD-10-CM | POA: Diagnosis not present

## 2016-07-13 NOTE — Therapy (Signed)
[place], [place], [address] Phone: [phone]   Fax:  [phone]  Physical Therapy Treatment  Patient Details  Name: [name] MRN: [id number] Date of Birth: May 19, 1932 Referring Provider: Robby Sermon  Encounter Date: 07/13/2016      PT End of Session - 07/13/16 0921    Visit Number 3   Date for PT Re-Evaluation 08/27/16   PT Start Time 0843   PT Stop Time 0925   PT Time Calculation (min) 42 min   Activity Tolerance Patient tolerated treatment well   Behavior During Therapy [facility] for tasks assessed/performed      Past Medical History:  Diagnosis Date  . Arthritis   . GERD (gastroesophageal reflux disease)   . Hypothyroidism   . PONV (postoperative nausea and vomiting)   . Ptosis of both eyelids   . Vertigo   . Wears glasses    reading    Past Surgical History:  Procedure Laterality Date  . ABDOMINAL HYSTERECTOMY  1986  . [place]   right  . CARPAL TUNNEL RELEASE  2012   left  . COLONOSCOPY    . CYSTOCELE REPAIR  2007   sling  . DISTAL INTERPHALANGEAL JOINT FUSION Right 02/04/2014   Procedure: FUSION DISTAL INTERPHALANGEAL JOINT RIGHT INDEX FINGER ;  Surgeon: Daryll Brod, MD;  Location: [place];  Service: Orthopedics;  Laterality: Right;  . EYE SURGERY    . KNEE SURGERY  2009,2011   partial knee  . OPEN REDUCTION INTERNAL FIXATION (ORIF) PROXIMAL PHALANX Left 08/30/2013   Procedure: OPEN REDUCTION INTERNAL FIXATION (ORIF) PROXIMAL PHALANX FRACTURE LEFT SMALL FINGER; SPLINT RING FINGER;  Surgeon: Wynonia Sours, MD;  Location: [place];  Service: Orthopedics;  Laterality: Left;  . PTOSIS REPAIR Bilateral 07/27/2015   Procedure: PTOSIS REPAIR;  Surgeon: Cristine Polio, MD;  Location: [place];  Service: Plastics;  Laterality: Bilateral;  . SHOULDER SURGERY     rt rcr,and  lt  . TONSILLECTOMY      There were no vitals filed for this visit.      Subjective Assessment - 07/13/16 0843    Subjective "Its doing good, I went to [facility] about my knee"   Currently in Pain? No/denies   Pain Score 0-No pain                         [facility] Adult PT Treatment/Exercise - 07/13/16 0001      Knee/Hip Exercises: Machines for Strengthening   Cybex Knee Extension 10lb 2x15; RLE 5lb 2x10   Cybex Knee Flexion 20lb 2x15; RLE 15lb 2x10    Cybex Leg Press 20lb 2x15; RLE TKE 20lb 3x5      Knee/Hip Exercises: Standing   Heel Raises 2 sets;15 reps;2 seconds   Walking with Sports Cord 30lb 4 way x 3 each      Knee/Hip Exercises: Seated   Sit to Sand 2 sets;10 reps;without UE support  holding red ball                   PT Short Term Goals - 07/13/16 0922      PT SHORT TERM GOAL #1   Title independent with initial HEP   Status Achieved           PT Long Term Goals - 07/13/16 6812  PT LONG TERM GOAL #1   Title decrease pain 50%   Status Partially Met     PT LONG TERM GOAL #2   Title increase AROM to 0-115 degrees flexion   Status On-going     PT LONG TERM GOAL #3   Title decrease swelling to = that of the left leg   Status On-going     PT LONG TERM GOAL #4   Title go up and down stairs step over step   Status On-going               Plan - 07/13/16 2263    Clinical Impression Statement STG met. Pt tolerated all activities well with no reports of increase pain. Pt with some instability with resisted  sport cord walking. Does requires cues at time to focus during exercises because he can be hyper verbal at times.   Rehab Potential Good   PT Frequency 2 / week   PT Duration 6 weeks   PT Treatment/Interventions ADLs/Self Care Home Management;Cryotherapy;Electrical Stimulation;Gait training;Stair training;Functional mobility training;Patient/family education;Therapeutic exercise;Therapeutic activities;Manual  techniques;Vasopneumatic Device   PT Next Visit Plan Progress as tolerated      Patient will benefit from skilled therapeutic intervention in order to improve the following deficits and impairments:  Abnormal gait, Decreased activity tolerance, Decreased mobility, Decreased strength, Increased edema, Decreased range of motion, Difficulty walking, Pain  Visit Diagnosis: Acute pain of right knee  Difficulty in walking, not elsewhere classified  Localized swelling, mass and lump, right lower limb     Problem List There are no active problems to display for this patient.   Scot Jun, PTA 07/13/2016, 9:25 AM  [place], [place], [address] Phone: [phone]   Fax:  [phone]  Name: [name] MRN: [id number] Date of Birth: 12/19/1932

## 2016-07-20 ENCOUNTER — Encounter: Payer: Self-pay | Admitting: Physical Therapy

## 2016-07-20 ENCOUNTER — Ambulatory Visit: Payer: Medicare Other | Admitting: Physical Therapy

## 2016-07-20 DIAGNOSIS — R2241 Localized swelling, mass and lump, right lower limb: Secondary | ICD-10-CM

## 2016-07-20 DIAGNOSIS — R262 Difficulty in walking, not elsewhere classified: Secondary | ICD-10-CM

## 2016-07-20 DIAGNOSIS — M25561 Pain in right knee: Secondary | ICD-10-CM

## 2016-07-20 NOTE — Therapy (Addendum)
[place], [place], [address] Phone: [phone]   Fax:  [phone]  Physical Therapy Treatment  Patient Details  Name: [name] MRN: [id number] Date of Birth: [birth year] Referring Provider: Robby Sermon  Encounter Date: 07/20/2016      PT End of Session - 07/20/16 1010    Visit Number 4   Date for PT Re-Evaluation 08/27/16   PT Start Time 0928   PT Stop Time 1010   PT Time Calculation (min) 42 min   Activity Tolerance Patient tolerated treatment well   Behavior During Therapy [facility] for tasks assessed/performed      Past Medical History:  Diagnosis Date  . Arthritis   . GERD (gastroesophageal reflux disease)   . Hypothyroidism   . PONV (postoperative nausea and vomiting)   . Ptosis of both eyelids   . Vertigo   . Wears glasses    reading    Past Surgical History:  Procedure Laterality Date  . ABDOMINAL HYSTERECTOMY  1986  . [place]   right  . CARPAL TUNNEL RELEASE  2012   left  . COLONOSCOPY    . CYSTOCELE REPAIR  2007   sling  . DISTAL INTERPHALANGEAL JOINT FUSION Right 02/04/2014   Procedure: FUSION DISTAL INTERPHALANGEAL JOINT RIGHT INDEX FINGER ;  Surgeon: Daryll Brod, MD;  Location: [place];  Service: Orthopedics;  Laterality: Right;  . EYE SURGERY    . KNEE SURGERY  2009,2011   partial knee  . OPEN REDUCTION INTERNAL FIXATION (ORIF) PROXIMAL PHALANX Left 08/30/2013   Procedure: OPEN REDUCTION INTERNAL FIXATION (ORIF) PROXIMAL PHALANX FRACTURE LEFT SMALL FINGER; SPLINT RING FINGER;  Surgeon: Wynonia Sours, MD;  Location: [place];  Service: Orthopedics;  Laterality: Left;  . PTOSIS REPAIR Bilateral 07/27/2015   Procedure: PTOSIS REPAIR;  Surgeon: Cristine Polio, MD;  Location: [place];  Service: Plastics;  Laterality: Bilateral;  . SHOULDER SURGERY     rt rcr,and  lt  . TONSILLECTOMY      There were no vitals filed for this visit.      Subjective Assessment - 07/20/16 0928    Subjective "Im doing good, I go to [place] Friday"   Currently in Pain? Yes   Pain Score 7    Pain Location Knee   Pain Orientation Right;Lower;Lateral            [facility] PT Assessment - 07/20/16 0001      ROM / Strength   AROM / PROM / Strength AROM     AROM   Overall AROM Comments AROM in sitting right knee 3-110 degrees flexion                     [facility] Adult PT Treatment/Exercise - 07/20/16 0001      Knee/Hip Exercises: Aerobic   Recumbent Bike L1 x4 min  seat 5   Nustep L3 LE only x6 min      Knee/Hip Exercises: Machines for Strengthening   Cybex Knee Extension 10lb 2x15; RLE 5lb 2x10   Cybex Knee Flexion 25lb 2x15; RLE 15lb 2x10    Cybex Leg Press 20lb 2x15; RLE TKE 20lb 2x10     Knee/Hip Exercises: Seated   Sit to Sand 10 reps;without UE support;3 sets  LE on airex  PT Short Term Goals - 07/13/16 3202      PT SHORT TERM GOAL #1   Title independent with initial HEP   Status Achieved           PT Long Term Goals - 07/20/16 1010      PT LONG TERM GOAL #1   Title decrease pain 50%   Status Achieved     PT LONG TERM GOAL #2   Title increase AROM to 0-115 degrees flexion   Status On-going     PT LONG TERM GOAL #3   Title decrease swelling to = that of the left leg   Status Partially Met     PT LONG TERM GOAL #4   Title go up and down stairs step over step   Status On-going               Plan - 07/20/16 1011    Clinical Impression Statement Pt continues to do well in PT. She reports no functional limitations at home. Pt entered clinic reports on lateral R knee pain the went away after she warmed up on NuStep. Fatigues quick with sit to stand. Does have little instability standing on airex pad. Cues required to keep rep count with machine interventions. Does report that her HS are  prone to cramping.   Rehab Potential Good   PT Frequency 2x / week   PT Duration 6 weeks   PT Next Visit Plan Stairs, outdoor walking      Patient will benefit from skilled therapeutic intervention in order to improve the following deficits and impairments:  Abnormal gait, Decreased activity tolerance, Decreased mobility, Decreased strength, Increased edema, Decreased range of motion, Difficulty walking, Pain  Visit Diagnosis: Acute pain of right knee  Difficulty in walking, not elsewhere classified  Localized swelling, mass and lump, right lower limb     Problem List There are no active problems to display for this patient.  PHYSICAL THERAPY DISCHARGE SUMMARY  Visits from Start of Care: 4  Plan: Patient agrees to discharge.  Patient goals were partially met. Patient is being discharged due to being pleased with the current functional level.  ?????      Scot Jun, PTA 07/20/2016, 10:15 AM  [place] [address] [place], [place], [address] Phone: [phone]   Fax:  [phone]  Name: [name] MRN: [id number] Date of Birth: 01/21/1933

## 2016-07-22 ENCOUNTER — Ambulatory Visit: Payer: Medicare Other | Admitting: Physical Therapy

## 2016-09-02 ENCOUNTER — Other Ambulatory Visit: Payer: Self-pay

## 2017-05-09 ENCOUNTER — Other Ambulatory Visit: Payer: Self-pay | Admitting: Neurological Surgery

## 2017-06-05 ENCOUNTER — Other Ambulatory Visit: Payer: Self-pay | Admitting: Internal Medicine

## 2017-06-05 DIAGNOSIS — Z1231 Encounter for screening mammogram for malignant neoplasm of breast: Secondary | ICD-10-CM

## 2017-06-07 ENCOUNTER — Encounter (HOSPITAL_COMMUNITY): Payer: Self-pay

## 2017-06-07 ENCOUNTER — Other Ambulatory Visit: Payer: Self-pay

## 2017-06-07 ENCOUNTER — Encounter (HOSPITAL_COMMUNITY)
Admission: RE | Admit: 2017-06-07 | Discharge: 2017-06-07 | Disposition: A | Discharge status: Home / Self Care | Payer: Medicare Other | Source: Ambulatory Visit | Attending: Neurological Surgery | Admitting: Neurological Surgery

## 2017-06-07 ENCOUNTER — Other Ambulatory Visit (HOSPITAL_COMMUNITY): Payer: Self-pay | Admitting: *Deleted

## 2017-06-07 DIAGNOSIS — Z01812 Encounter for preprocedural laboratory examination: Secondary | ICD-10-CM | POA: Insufficient documentation

## 2017-06-07 LAB — CBC
HCT: 40.2 % (ref 36.0–46.0)
Hemoglobin: 13 g/dL (ref 12.0–15.0)
MCH: 29.9 pg (ref 26.0–34.0)
MCHC: 32.3 g/dL (ref 30.0–36.0)
MCV: 92.4 fL (ref 78.0–100.0)
Platelets: 281 10*3/uL (ref 150–400)
RBC: 4.35 MIL/uL (ref 3.87–5.11)
RDW: 13.6 % (ref 11.5–15.5)
WBC: 5.2 10*3/uL (ref 4.0–10.5)

## 2017-06-07 LAB — BASIC METABOLIC PANEL
Anion gap: 12 (ref 5–15)
BUN: 12 mg/dL (ref 6–20)
CO2: 22 mmol/L (ref 22–32)
Calcium: 9.5 mg/dL (ref 8.9–10.3)
Chloride: 103 mmol/L (ref 101–111)
Creatinine, Ser: 0.68 mg/dL (ref 0.44–1.00)
GFR calc Af Amer: >60 mL/min (ref >60)
GFR calc non Af Amer: >60 mL/min (ref >60)
Glucose, Bld: 105 mg/dL — ABNORMAL HIGH (ref 65–99)
Potassium: 4.2 mmol/L (ref 3.5–5.1)
Sodium: 137 mmol/L (ref 135–145)

## 2017-06-07 LAB — ABO/RH: ABO/RH(D): A POS

## 2017-06-07 LAB — SURGICAL PCR SCREEN
MRSA, PCR: NEGATIVE
Staphylococcus aureus: NEGATIVE

## 2017-06-07 NOTE — Pre-Procedure Instructions (Signed)
[name]  06/07/2017    Your procedure is scheduled on Tuesday, June 13, 2017 at 7:30 AM.   Report to [facility] Entrance "A" Admitting Office at 5:30 AM.   Call this number if you have problems the morning of surgery: [phone]   Questions prior to day of surgery, please call [phone] between 8 & 4 PM.   Remember:  Do not eat food or drink liquids after midnight Monday, 06/12/17  Take these medicines the morning of surgery with A SIP OF WATER: Levothyroxine (Synthroid), Omeprazole (Prilosec), Tramadol or Tylenol - if needed, Meclizine  (Antivert) - if needed  Stop Multivitamins, Herbal Medications and NSAIDS (Meloxicam, Mobic, Ibuprofen, Aleve, etc) as of today. Do not use Aspirin products prior to surgery.   Do not wear jewelry, make-up or nail polish.  Do not wear lotions, powders, perfumes or deodorant.  Do not shave 48 hours prior to surgery.    Do not bring valuables to the hospital.  [facility] is not responsible for any belongings or valuables.  Contacts, dentures or bridgework may not be worn into surgery.  Leave your suitcase in the car.  After surgery it may be brought to your room.  For patients admitted to the hospital, discharge time will be determined by your treatment team.  [facility] - Preparing for Surgery  Before surgery, you can play an important role.  Because skin is not sterile, your skin needs to be as free of germs as possible.  You can reduce the number of germs on you skin by washing with CHG (chlorahexidine gluconate) soap before surgery.  CHG is an antiseptic cleaner which kills germs and bonds with the skin to continue killing germs even after washing.  Please DO NOT use if you have an allergy to CHG or antibacterial soaps.  If your skin becomes reddened/irritated stop using the CHG and inform your nurse when you arrive at Short Stay.  Do not shave (including legs and underarms) for at least 48 hours prior to the first CHG  shower.  You may shave your face.  Please follow these instructions carefully:   1.  Shower with CHG Soap the night before surgery and the                    morning of Surgery.  2.  If you choose to wash your hair, wash your hair first as usual with your       normal shampoo.  3.  After you shampoo, rinse your hair and body thoroughly to remove the shampoo.  4.  Use CHG as you would any other liquid soap.  You can apply chg directly       to the skin and wash gently with scrungie or a clean washcloth.  5.  Apply the CHG Soap to your body ONLY FROM THE NECK DOWN.        Do not use on open wounds or open sores.  Avoid contact with your eyes, ears, mouth and genitals (private parts).  Wash genitals (private parts) with your normal soap.  6.  Wash thoroughly, paying special attention to the area where your surgery        will be performed.  7.  Thoroughly rinse your body with warm water from the neck down.  8.  DO NOT shower/wash with your normal soap after using and rinsing off       the CHG Soap.  9.  Pat yourself dry  with a clean towel.            10.  Wear clean pajamas.            11.  Place clean sheets on your bed the night of your first shower and do not        sleep with pets.  Day of Surgery  Shower as above. Do not apply any lotions/deodorants the morning of surgery.  Please wear clean clothes to the hospital.   Please read over the fact sheets that you were given.

## 2017-06-07 NOTE — Progress Notes (Signed)
Pt denies cardiac history, HTN or diabetes. Pt states she had an EKG done at Dr. Lorene Dy office in September, have requested a copy be faxed to [place].

## 2017-06-12 ENCOUNTER — Encounter (HOSPITAL_COMMUNITY): Payer: Self-pay | Admitting: Certified Registered Nurse Anesthetist

## 2017-06-12 MED ORDER — VANCOMYCIN HCL 10 G IV SOLR
1500.0000 mg | INTRAVENOUS | Status: AC
  Administered 2017-06-13 (×2): 1500 mg via INTRAVENOUS
  Filled 2017-06-12: qty 1500

## 2017-06-13 ENCOUNTER — Encounter (HOSPITAL_COMMUNITY): Payer: Self-pay | Admitting: Urology

## 2017-06-13 ENCOUNTER — Inpatient Hospital Stay (HOSPITAL_COMMUNITY): Payer: Medicare Other

## 2017-06-13 ENCOUNTER — Other Ambulatory Visit: Payer: Self-pay

## 2017-06-13 ENCOUNTER — Inpatient Hospital Stay (HOSPITAL_COMMUNITY): Payer: Medicare Other | Admitting: Emergency Medicine

## 2017-06-13 ENCOUNTER — Inpatient Hospital Stay (HOSPITAL_COMMUNITY): Payer: Medicare Other | Admitting: Certified Registered Nurse Anesthetist

## 2017-06-13 ENCOUNTER — Inpatient Hospital Stay (HOSPITAL_COMMUNITY)
Admission: RE | Admit: 2017-06-13 | Discharge: 2017-06-17 | DRG: 454 | Disposition: A | Discharge status: Home / Self Care | Payer: Medicare Other | Source: Ambulatory Visit | Attending: Neurological Surgery | Admitting: Neurological Surgery

## 2017-06-13 ENCOUNTER — Encounter (HOSPITAL_COMMUNITY)
Admission: RE | Disposition: A | Discharge status: Home / Self Care | Payer: Self-pay | Source: Ambulatory Visit | Attending: Neurological Surgery

## 2017-06-13 DIAGNOSIS — D62 Acute posthemorrhagic anemia: Secondary | ICD-10-CM | POA: Diagnosis not present

## 2017-06-13 DIAGNOSIS — Z972 Presence of dental prosthetic device (complete) (partial): Secondary | ICD-10-CM

## 2017-06-13 DIAGNOSIS — E039 Hypothyroidism, unspecified: Secondary | ICD-10-CM | POA: Diagnosis present

## 2017-06-13 DIAGNOSIS — Z791 Long term (current) use of non-steroidal anti-inflammatories (NSAID): Secondary | ICD-10-CM | POA: Diagnosis not present

## 2017-06-13 DIAGNOSIS — Z881 Allergy status to other antibiotic agents status: Secondary | ICD-10-CM

## 2017-06-13 DIAGNOSIS — Z79890 Hormone replacement therapy (postmenopausal): Secondary | ICD-10-CM

## 2017-06-13 DIAGNOSIS — Z09 Encounter for follow-up examination after completed treatment for conditions other than malignant neoplasm: Secondary | ICD-10-CM

## 2017-06-13 DIAGNOSIS — M48062 Spinal stenosis, lumbar region with neurogenic claudication: Principal | ICD-10-CM | POA: Diagnosis present

## 2017-06-13 DIAGNOSIS — M1990 Unspecified osteoarthritis, unspecified site: Secondary | ICD-10-CM | POA: Diagnosis not present

## 2017-06-13 DIAGNOSIS — Z419 Encounter for procedure for purposes other than remedying health state, unspecified: Secondary | ICD-10-CM

## 2017-06-13 DIAGNOSIS — Z79899 Other long term (current) drug therapy: Secondary | ICD-10-CM

## 2017-06-13 DIAGNOSIS — Z90710 Acquired absence of both cervix and uterus: Secondary | ICD-10-CM

## 2017-06-13 DIAGNOSIS — K219 Gastro-esophageal reflux disease without esophagitis: Secondary | ICD-10-CM | POA: Diagnosis present

## 2017-06-13 DIAGNOSIS — M419 Scoliosis, unspecified: Secondary | ICD-10-CM | POA: Diagnosis present

## 2017-06-13 DIAGNOSIS — Z87891 Personal history of nicotine dependence: Secondary | ICD-10-CM | POA: Diagnosis not present

## 2017-06-13 DIAGNOSIS — M4186 Other forms of scoliosis, lumbar region: Secondary | ICD-10-CM | POA: Diagnosis not present

## 2017-06-13 DIAGNOSIS — M25552 Pain in left hip: Secondary | ICD-10-CM

## 2017-06-13 DIAGNOSIS — M4726 Other spondylosis with radiculopathy, lumbar region: Secondary | ICD-10-CM | POA: Diagnosis present

## 2017-06-13 DIAGNOSIS — M549 Dorsalgia, unspecified: Secondary | ICD-10-CM | POA: Diagnosis present

## 2017-06-13 DIAGNOSIS — Z981 Arthrodesis status: Secondary | ICD-10-CM

## 2017-06-13 LAB — PREPARE RBC (CROSSMATCH)

## 2017-06-13 SURGERY — POSTERIOR LUMBAR FUSION 3 LEVEL
Anesthesia: General | Site: Back

## 2017-06-13 MED ORDER — KETOROLAC TROMETHAMINE 15 MG/ML IJ SOLN
INTRAMUSCULAR | Status: AC
  Filled 2017-06-13: qty 1

## 2017-06-13 MED ORDER — BUPIVACAINE HCL (PF) 0.5 % IJ SOLN
INTRAMUSCULAR | Status: DC | PRN
  Administered 2017-06-13: 18 mL
  Administered 2017-06-13: 5 mL

## 2017-06-13 MED ORDER — ALBUMIN HUMAN 5 % IV SOLN
12.5000 g | INTRAVENOUS | Status: AC
  Administered 2017-06-13: 12.5 g via INTRAVENOUS

## 2017-06-13 MED ORDER — LIDOCAINE HCL (CARDIAC) 20 MG/ML IV SOLN
INTRAVENOUS | Status: DC | PRN
  Administered 2017-06-13: 100 mg via INTRAVENOUS

## 2017-06-13 MED ORDER — DEXAMETHASONE SODIUM PHOSPHATE 10 MG/ML IJ SOLN
INTRAMUSCULAR | Status: AC
  Filled 2017-06-13: qty 1

## 2017-06-13 MED ORDER — POLYETHYLENE GLYCOL 3350 17 G PO PACK
17.0000 g | PACK | Freq: Every day | ORAL | Status: DC | PRN
  Administered 2017-06-14 – 2017-06-15 (×2): 17 g via ORAL
  Filled 2017-06-13 (×2): qty 1

## 2017-06-13 MED ORDER — KETOROLAC TROMETHAMINE 15 MG/ML IJ SOLN: 7.5000 mg | Freq: Three times a day (TID) | INTRAMUSCULAR | Status: DC

## 2017-06-13 MED ORDER — ALBUMIN HUMAN 5 % IV SOLN
INTRAVENOUS | Status: AC
  Filled 2017-06-13: qty 250

## 2017-06-13 MED ORDER — BISACODYL 10 MG RE SUPP: 10.0000 mg | Freq: Every day | RECTAL | Status: DC | PRN

## 2017-06-13 MED ORDER — LEVOTHYROXINE SODIUM 50 MCG PO TABS
50.0000 ug | ORAL_TABLET | Freq: Every day | ORAL | Status: DC
  Administered 2017-06-14 – 2017-06-17 (×4): 50 ug via ORAL
  Filled 2017-06-13 (×4): qty 1

## 2017-06-13 MED ORDER — SODIUM CHLORIDE 0.9% FLUSH: 3.0000 mL | INTRAVENOUS | Status: DC | PRN

## 2017-06-13 MED ORDER — HYDROCODONE-ACETAMINOPHEN 5-325 MG PO TABS
2.0000 | ORAL_TABLET | ORAL | Status: DC | PRN
  Administered 2017-06-13 – 2017-06-16 (×3): 2 via ORAL
  Filled 2017-06-13 (×3): qty 2

## 2017-06-13 MED ORDER — ONDANSETRON HCL 4 MG/2ML IJ SOLN
INTRAMUSCULAR | Status: AC
  Filled 2017-06-13: qty 2

## 2017-06-13 MED ORDER — LACTATED RINGERS IV SOLN
INTRAVENOUS | Status: DC
  Administered 2017-06-13 (×2): via INTRAVENOUS

## 2017-06-13 MED ORDER — ALBUMIN HUMAN 5 % IV SOLN
INTRAVENOUS | Status: DC | PRN
  Administered 2017-06-13 (×2): via INTRAVENOUS

## 2017-06-13 MED ORDER — MORPHINE SULFATE (PF) 4 MG/ML IV SOLN
2.0000 mg | INTRAVENOUS | Status: DC | PRN
  Administered 2017-06-15 – 2017-06-16 (×4): 2 mg via INTRAVENOUS
  Filled 2017-06-13 (×4): qty 1

## 2017-06-13 MED ORDER — GLYCOPYRROLATE 0.2 MG/ML IJ SOLN
INTRAMUSCULAR | Status: DC | PRN
  Administered 2017-06-13: 0.2 mg via INTRAVENOUS

## 2017-06-13 MED ORDER — METHOCARBAMOL 1000 MG/10ML IJ SOLN
500.0000 mg | Freq: Four times a day (QID) | INTRAVENOUS | Status: DC | PRN
  Filled 2017-06-13: qty 5

## 2017-06-13 MED ORDER — CEFAZOLIN SODIUM-DEXTROSE 2-4 GM/100ML-% IV SOLN: 2.0000 g | Freq: Three times a day (TID) | INTRAVENOUS | Status: DC

## 2017-06-13 MED ORDER — SENNA 8.6 MG PO TABS
1.0000 | ORAL_TABLET | Freq: Two times a day (BID) | ORAL | Status: DC
  Administered 2017-06-14 – 2017-06-17 (×7): 8.6 mg via ORAL
  Filled 2017-06-13 (×8): qty 1

## 2017-06-13 MED ORDER — THROMBIN (RECOMBINANT) 20000 UNITS EX SOLR
CUTANEOUS | Status: AC
  Filled 2017-06-13: qty 20000

## 2017-06-13 MED ORDER — LIDOCAINE-EPINEPHRINE 1 %-1:100000 IJ SOLN
INTRAMUSCULAR | Status: DC | PRN
Start: 2017-06-13 — End: 2017-06-13
  Administered 2017-06-13: 5 mL

## 2017-06-13 MED ORDER — POLYVINYL ALCOHOL 1.4 % OP SOLN
1.0000 [drp] | OPHTHALMIC | Status: DC | PRN
  Filled 2017-06-13: qty 15

## 2017-06-13 MED ORDER — ROCURONIUM BROMIDE 50 MG/5ML IV SOLN
INTRAVENOUS | Status: AC
  Filled 2017-06-13: qty 2

## 2017-06-13 MED ORDER — MENTHOL 3 MG MT LOZG: 1.0000 | LOZENGE | OROMUCOSAL | Status: DC | PRN

## 2017-06-13 MED ORDER — KETOROLAC TROMETHAMINE 15 MG/ML IJ SOLN
7.5000 mg | Freq: Three times a day (TID) | INTRAMUSCULAR | Status: DC
  Administered 2017-06-13 – 2017-06-17 (×11): 7.5 mg via INTRAVENOUS
  Filled 2017-06-13 (×11): qty 1

## 2017-06-13 MED ORDER — FENTANYL CITRATE (PF) 100 MCG/2ML IJ SOLN
INTRAMUSCULAR | Status: DC | PRN
  Administered 2017-06-13 (×3): 50 ug via INTRAVENOUS
  Administered 2017-06-13: 100 ug via INTRAVENOUS
  Administered 2017-06-13: 50 ug via INTRAVENOUS
  Administered 2017-06-13: 100 ug via INTRAVENOUS

## 2017-06-13 MED ORDER — MORPHINE SULFATE (PF) 4 MG/ML IV SOLN
INTRAVENOUS | Status: AC
  Filled 2017-06-13: qty 1

## 2017-06-13 MED ORDER — CHLORHEXIDINE GLUCONATE CLOTH 2 % EX PADS: 6.0000 | MEDICATED_PAD | Freq: Once | CUTANEOUS | Status: DC

## 2017-06-13 MED ORDER — THROMBIN (RECOMBINANT) 5000 UNITS EX SOLR
OROMUCOSAL | Status: DC | PRN
  Administered 2017-06-13: 09:00:00 via TOPICAL

## 2017-06-13 MED ORDER — THROMBIN (RECOMBINANT) 5000 UNITS EX SOLR
CUTANEOUS | Status: DC | PRN
  Administered 2017-06-13: 10:00:00 via TOPICAL

## 2017-06-13 MED ORDER — PROPOFOL 10 MG/ML IV BOLUS
INTRAVENOUS | Status: DC | PRN
  Administered 2017-06-13: 140 mg via INTRAVENOUS

## 2017-06-13 MED ORDER — ARTIFICIAL TEARS OPHTHALMIC OINT
TOPICAL_OINTMENT | OPHTHALMIC | Status: DC | PRN
  Administered 2017-06-13: 1 via OPHTHALMIC

## 2017-06-13 MED ORDER — PHENYLEPHRINE HCL 10 MG/ML IJ SOLN
INTRAMUSCULAR | Status: DC | PRN
  Administered 2017-06-13: 160 ug via INTRAVENOUS
  Administered 2017-06-13: 120 ug via INTRAVENOUS
  Administered 2017-06-13: 160 ug via INTRAVENOUS
  Administered 2017-06-13: 120 ug via INTRAVENOUS

## 2017-06-13 MED ORDER — FENTANYL CITRATE (PF) 250 MCG/5ML IJ SOLN
INTRAMUSCULAR | Status: AC
  Filled 2017-06-13: qty 5

## 2017-06-13 MED ORDER — FLEET ENEMA 7-19 GM/118ML RE ENEM: 1.0000 | ENEMA | Freq: Once | RECTAL | Status: DC | PRN

## 2017-06-13 MED ORDER — ONDANSETRON HCL 4 MG/2ML IJ SOLN
4.0000 mg | Freq: Four times a day (QID) | INTRAMUSCULAR | Status: DC | PRN
  Administered 2017-06-13: 4 mg via INTRAVENOUS

## 2017-06-13 MED ORDER — 0.9 % SODIUM CHLORIDE (POUR BTL) OPTIME
TOPICAL | Status: DC | PRN
  Administered 2017-06-13: 1000 mL

## 2017-06-13 MED ORDER — MORPHINE SULFATE (PF) 2 MG/ML IV SOLN
2.0000 mg | INTRAVENOUS | Status: DC | PRN
  Administered 2017-06-13: 2 mg via INTRAVENOUS

## 2017-06-13 MED ORDER — PHENYLEPHRINE HCL 10 MG/ML IJ SOLN
INTRAMUSCULAR | Status: DC | PRN
  Administered 2017-06-13: 25 ug/min via INTRAVENOUS
  Administered 2017-06-13: 13:00:00 via INTRAVENOUS

## 2017-06-13 MED ORDER — ONDANSETRON HCL 4 MG PO TABS: 4.0000 mg | ORAL_TABLET | Freq: Four times a day (QID) | ORAL | Status: DC | PRN

## 2017-06-13 MED ORDER — BUPIVACAINE HCL (PF) 0.5 % IJ SOLN
INTRAMUSCULAR | Status: AC
  Filled 2017-06-13: qty 30

## 2017-06-13 MED ORDER — ROCURONIUM BROMIDE 100 MG/10ML IV SOLN
INTRAVENOUS | Status: DC | PRN
  Administered 2017-06-13 (×2): 20 mg via INTRAVENOUS
  Administered 2017-06-13: 50 mg via INTRAVENOUS
  Administered 2017-06-13: 30 mg via INTRAVENOUS
  Administered 2017-06-13: 20 mg via INTRAVENOUS

## 2017-06-13 MED ORDER — ONDANSETRON HCL 4 MG/2ML IJ SOLN
INTRAMUSCULAR | Status: DC | PRN
  Administered 2017-06-13: 4 mg via INTRAVENOUS

## 2017-06-13 MED ORDER — LIDOCAINE-EPINEPHRINE 1 %-1:100000 IJ SOLN
INTRAMUSCULAR | Status: AC
  Filled 2017-06-13: qty 1

## 2017-06-13 MED ORDER — SODIUM CHLORIDE 0.9 % IV SOLN
INTRAVENOUS | Status: DC | PRN
  Administered 2017-06-13 (×2): via INTRAVENOUS

## 2017-06-13 MED ORDER — DEXAMETHASONE SODIUM PHOSPHATE 10 MG/ML IJ SOLN
INTRAMUSCULAR | Status: DC | PRN
  Administered 2017-06-13: 10 mg via INTRAVENOUS

## 2017-06-13 MED ORDER — POLYETHYL GLYCOL-PROPYL GLYCOL 0.4-0.3 % OP GEL: Freq: Two times a day (BID) | OPHTHALMIC | Status: DC

## 2017-06-13 MED ORDER — BUMETANIDE 0.5 MG PO TABS
0.5000 mg | ORAL_TABLET | Freq: Every day | ORAL | Status: DC | PRN
  Filled 2017-06-13: qty 1

## 2017-06-13 MED ORDER — SODIUM CHLORIDE 0.9 % IV SOLN: Freq: Once | INTRAVENOUS | Status: DC

## 2017-06-13 MED ORDER — HYDROCODONE-ACETAMINOPHEN 5-325 MG PO TABS
1.0000 | ORAL_TABLET | ORAL | Status: DC | PRN
  Administered 2017-06-14 – 2017-06-16 (×4): 1 via ORAL
  Filled 2017-06-13 (×4): qty 1

## 2017-06-13 MED ORDER — SUGAMMADEX SODIUM 200 MG/2ML IV SOLN
INTRAVENOUS | Status: DC | PRN
  Administered 2017-06-13: 200 mg via INTRAVENOUS

## 2017-06-13 MED ORDER — SODIUM CHLORIDE 0.9% FLUSH
3.0000 mL | Freq: Two times a day (BID) | INTRAVENOUS | Status: DC
  Administered 2017-06-13 – 2017-06-17 (×8): 3 mL via INTRAVENOUS

## 2017-06-13 MED ORDER — ACETAMINOPHEN 650 MG RE SUPP: 650.0000 mg | RECTAL | Status: DC | PRN

## 2017-06-13 MED ORDER — EPHEDRINE SULFATE 50 MG/ML IJ SOLN
INTRAMUSCULAR | Status: DC | PRN
  Administered 2017-06-13 (×2): 10 mg via INTRAVENOUS

## 2017-06-13 MED ORDER — PHENOL 1.4 % MT LIQD: 1.0000 | OROMUCOSAL | Status: DC | PRN

## 2017-06-13 MED ORDER — OXYCODONE-ACETAMINOPHEN 5-325 MG PO TABS
1.0000 | ORAL_TABLET | ORAL | Status: DC | PRN
  Administered 2017-06-14 – 2017-06-17 (×5): 2 via ORAL
  Filled 2017-06-13 (×5): qty 2

## 2017-06-13 MED ORDER — ACETAMINOPHEN 10 MG/ML IV SOLN
INTRAVENOUS | Status: DC | PRN
  Administered 2017-06-13: 1000 mg via INTRAVENOUS

## 2017-06-13 MED ORDER — METHOCARBAMOL 500 MG PO TABS
500.0000 mg | ORAL_TABLET | Freq: Four times a day (QID) | ORAL | Status: DC | PRN
  Administered 2017-06-13 – 2017-06-15 (×4): 500 mg via ORAL
  Filled 2017-06-13 (×4): qty 1

## 2017-06-13 MED ORDER — DOCUSATE SODIUM 100 MG PO CAPS
100.0000 mg | ORAL_CAPSULE | Freq: Two times a day (BID) | ORAL | Status: DC
  Administered 2017-06-14 – 2017-06-17 (×7): 100 mg via ORAL
  Filled 2017-06-13 (×8): qty 1

## 2017-06-13 MED ORDER — THROMBIN (RECOMBINANT) 20000 UNITS EX SOLR: CUTANEOUS | Status: DC | PRN

## 2017-06-13 MED ORDER — PROPOFOL 10 MG/ML IV BOLUS
INTRAVENOUS | Status: AC
  Filled 2017-06-13: qty 20

## 2017-06-13 MED ORDER — THROMBIN (RECOMBINANT) 5000 UNITS EX SOLR
CUTANEOUS | Status: AC
  Filled 2017-06-13: qty 15000

## 2017-06-13 MED ORDER — VANCOMYCIN HCL IN DEXTROSE 1-5 GM/200ML-% IV SOLN
1000.0000 mg | Freq: Once | INTRAVENOUS | Status: AC
  Administered 2017-06-13: 1000 mg via INTRAVENOUS
  Filled 2017-06-13: qty 200

## 2017-06-13 MED ORDER — ARTIFICIAL TEARS OPHTHALMIC OINT
TOPICAL_OINTMENT | OPHTHALMIC | Status: AC
  Filled 2017-06-13: qty 3.5

## 2017-06-13 MED ORDER — LACTATED RINGERS IV SOLN
INTRAVENOUS | Status: DC | PRN
  Administered 2017-06-13: 14:00:00 via INTRAVENOUS

## 2017-06-13 MED ORDER — LACTATED RINGERS IV SOLN: INTRAVENOUS | Status: DC | PRN

## 2017-06-13 MED ORDER — ACETAMINOPHEN 325 MG PO TABS
650.0000 mg | ORAL_TABLET | ORAL | Status: DC | PRN
  Administered 2017-06-14: 650 mg via ORAL
  Filled 2017-06-13: qty 2

## 2017-06-13 MED ORDER — THROMBIN (RECOMBINANT) 20000 UNITS EX SOLR
CUTANEOUS | Status: DC | PRN
  Administered 2017-06-13: 11:00:00 via TOPICAL

## 2017-06-13 MED ORDER — LIDOCAINE HCL (CARDIAC) 20 MG/ML IV SOLN
INTRAVENOUS | Status: AC
  Filled 2017-06-13: qty 5

## 2017-06-13 MED ORDER — ALUM & MAG HYDROXIDE-SIMETH 200-200-20 MG/5ML PO SUSP: 30.0000 mL | Freq: Four times a day (QID) | ORAL | Status: DC | PRN

## 2017-06-13 MED ORDER — SODIUM CHLORIDE 0.9 % IR SOLN
Status: DC | PRN
  Administered 2017-06-13: 500 mL

## 2017-06-13 MED ORDER — LACTATED RINGERS IV SOLN
INTRAVENOUS | Status: DC
  Administered 2017-06-13: via INTRAVENOUS

## 2017-06-13 MED ORDER — PANTOPRAZOLE SODIUM 40 MG PO TBEC
40.0000 mg | DELAYED_RELEASE_TABLET | Freq: Every day | ORAL | Status: DC
  Administered 2017-06-13 – 2017-06-17 (×5): 40 mg via ORAL
  Filled 2017-06-13 (×5): qty 1

## 2017-06-13 MED ORDER — SODIUM CHLORIDE 0.9 % IV SOLN: 250.0000 mL | INTRAVENOUS | Status: DC

## 2017-06-13 SURGICAL SUPPLY — 75 items
BAG DECANTER FOR FLEXI CONT (MISCELLANEOUS) ×2 IMPLANT
BASKET BONE COLLECTION (BASKET) ×2 IMPLANT
BLADE CLIPPER SURG (BLADE) IMPLANT
BLADE SURG 10 STRL SS (BLADE) ×2 IMPLANT
BONE CANC CHIPS 20CC PCAN1/4 (Bone Implant) ×2 IMPLANT
BUR MATCHSTICK NEURO 3.0 LAGG (BURR) ×2 IMPLANT
CAGE COROENT LG 12X9X23-12 (Cage) ×4 IMPLANT
CAGE COROENT LRG MP 13X9X23-8 (Cage) ×4 IMPLANT
CAGE COROENT MP 8X9X23M-8 SPIN (Cage) ×4 IMPLANT
CANISTER SUCT 3000ML PPV (MISCELLANEOUS) ×2 IMPLANT
CHIPS CANC BONE 20CC PCAN1/4 (Bone Implant) ×1 IMPLANT
CONNECTOR RELINE 45-65 5.5 (Connector) ×2 IMPLANT
CONT SPEC 4OZ CLIKSEAL STRL BL (MISCELLANEOUS) ×2 IMPLANT
COVER BACK TABLE 60X90IN (DRAPES) ×2 IMPLANT
DECANTER SPIKE VIAL GLASS SM (MISCELLANEOUS) ×2 IMPLANT
DERMABOND ADVANCED (GAUZE/BANDAGES/DRESSINGS) ×1
DERMABOND ADVANCED .7 DNX12 (GAUZE/BANDAGES/DRESSINGS) ×1 IMPLANT
DEVICE DISSECT PLASMABLAD 3.0S (MISCELLANEOUS) ×1 IMPLANT
DRAPE C-ARM 42X72 X-RAY (DRAPES) ×6 IMPLANT
DRAPE HALF SHEET 40X57 (DRAPES) ×2 IMPLANT
DRAPE LAPAROTOMY 100X72X124 (DRAPES) ×2 IMPLANT
DURAPREP 26ML APPLICATOR (WOUND CARE) ×2 IMPLANT
DURASEAL APPLICATOR TIP (TIP) IMPLANT
DURASEAL SPINE SEALANT 3ML (MISCELLANEOUS) IMPLANT
ELECT REM PT RETURN 9FT ADLT (ELECTROSURGICAL) ×2
ELECTRODE REM PT RTRN 9FT ADLT (ELECTROSURGICAL) ×1 IMPLANT
GAUZE SPONGE 4X4 12PLY STRL (GAUZE/BANDAGES/DRESSINGS) IMPLANT
GAUZE SPONGE 4X4 16PLY XRAY LF (GAUZE/BANDAGES/DRESSINGS) ×2 IMPLANT
GLOVE BIO SURGEON STRL SZ8 (GLOVE) ×2 IMPLANT
GLOVE BIOGEL PI IND STRL 6.5 (GLOVE) ×2 IMPLANT
GLOVE BIOGEL PI IND STRL 7.5 (GLOVE) ×2 IMPLANT
GLOVE BIOGEL PI IND STRL 8.5 (GLOVE) ×2 IMPLANT
GLOVE BIOGEL PI INDICATOR 6.5 (GLOVE) ×2
GLOVE BIOGEL PI INDICATOR 7.5 (GLOVE) ×2
GLOVE BIOGEL PI INDICATOR 8.5 (GLOVE) ×2
GLOVE ECLIPSE 8.5 STRL (GLOVE) ×6 IMPLANT
GLOVE INDICATOR 8.5 STRL (GLOVE) ×4 IMPLANT
GLOVE SS BIOGEL STRL SZ 7 (GLOVE) ×5 IMPLANT
GLOVE SUPERSENSE BIOGEL SZ 7 (GLOVE) ×5
GLOVE SURG SS PI 6.0 STRL IVOR (GLOVE) ×4 IMPLANT
GOWN STRL REUS W/ TWL LRG LVL3 (GOWN DISPOSABLE) ×3 IMPLANT
GOWN STRL REUS W/ TWL XL LVL3 (GOWN DISPOSABLE) ×1 IMPLANT
GOWN STRL REUS W/TWL 2XL LVL3 (GOWN DISPOSABLE) ×4 IMPLANT
GOWN STRL REUS W/TWL LRG LVL3 (GOWN DISPOSABLE) ×3
GOWN STRL REUS W/TWL XL LVL3 (GOWN DISPOSABLE) ×1
HEMOSTAT POWDER KIT SURGIFOAM (HEMOSTASIS) ×4 IMPLANT
KIT BASIN OR (CUSTOM PROCEDURE TRAY) ×2 IMPLANT
KIT INFUSE MEDIUM (Orthopedic Implant) ×2 IMPLANT
KIT ROOM TURNOVER OR (KITS) ×2 IMPLANT
MILL MEDIUM DISP (BLADE) ×2 IMPLANT
MODULE POWER NUVASIVE (MISCELLANEOUS) ×1 IMPLANT
NEEDLE HYPO 22GX1.5 SAFETY (NEEDLE) ×2 IMPLANT
NS IRRIG 1000ML POUR BTL (IV SOLUTION) ×2 IMPLANT
PACK LAMINECTOMY NEURO (CUSTOM PROCEDURE TRAY) ×2 IMPLANT
PAD ARMBOARD 7.5X6 YLW CONV (MISCELLANEOUS) ×6 IMPLANT
PATTIES SURGICAL .5 X1 (DISPOSABLE) ×6 IMPLANT
PATTIES SURGICAL 1X1 (DISPOSABLE) ×4 IMPLANT
PLASMABLADE 3.0S (MISCELLANEOUS) ×2
POWER MODULE NUVASIVE (MISCELLANEOUS) ×2
ROD RELINE 5.5X90MM LORDOTIC (Rod) ×2 IMPLANT
ROD RELINE-O 5.5X100 LORD (Rod) ×2 IMPLANT
SCREW LOCK RELINE 5.5 TULIP (Screw) ×16 IMPLANT
SCREW RELINE-O POLY 6.5X45 (Screw) ×16 IMPLANT
SPONGE LAP 4X18 X RAY DECT (DISPOSABLE) ×2 IMPLANT
SPONGE SURGIFOAM ABS GEL 100 (HEMOSTASIS) ×2 IMPLANT
SUT PROLENE 6 0 BV (SUTURE) ×4 IMPLANT
SUT VIC AB 1 CT1 18XBRD ANBCTR (SUTURE) ×2 IMPLANT
SUT VIC AB 1 CT1 8-18 (SUTURE) ×2
SUT VIC AB 2-0 CP2 18 (SUTURE) ×4 IMPLANT
SUT VIC AB 3-0 SH 8-18 (SUTURE) ×4 IMPLANT
SYR 3ML LL SCALE MARK (SYRINGE) ×8 IMPLANT
TOWEL GREEN STERILE (TOWEL DISPOSABLE) ×2 IMPLANT
TOWEL GREEN STERILE FF (TOWEL DISPOSABLE) ×2 IMPLANT
TRAY FOLEY W/METER SILVER 16FR (SET/KITS/TRAYS/PACK) ×2 IMPLANT
WATER STERILE IRR 1000ML POUR (IV SOLUTION) ×2 IMPLANT

## 2017-06-13 NOTE — Anesthesia Postprocedure Evaluation (Signed)
Anesthesia Post Note  Patient: [name]  Procedure(s) Performed: Lumbar two-three, Lumbar three-four, Lumbar four-five Posterior lumbar interbody fusion (N/A Back)     Patient location during evaluation: PACU Anesthesia Type: General Level of consciousness: awake and sedated Pain management: pain level controlled Vital Signs Assessment: post-procedure vital signs reviewed and stable Respiratory status: spontaneous breathing, nonlabored ventilation, respiratory function stable and patient connected to nasal cannula oxygen Cardiovascular status: blood pressure returned to baseline and stable (low blood pressure Rxd c fluid and vasopressors ) Postop Assessment: no apparent nausea or vomiting Anesthetic complications: no    Last Vitals:  Vitals:   06/13/17 1630 06/13/17 1737  BP: (!) 103/54 (!) 93/56  Pulse: 65 80  Resp: 17 16  Temp:  (!) 36.3 C  SpO2: 100% 99%    Last Pain:  Vitals:   06/13/17 1737  TempSrc: Oral  PainSc:                  Angeliz Settlemyre,JAMES TERRILL

## 2017-06-13 NOTE — Progress Notes (Signed)
Spoke with dr Orene Desanctis re: bsp 60's/ hr stable/ pt nauseous and  Slow to respond / orders for albumin and transfuse 1 unit prbc's

## 2017-06-13 NOTE — Anesthesia Procedure Notes (Signed)
Procedure Name: Intubation Date/Time: 06/13/2017 7:44 AM Performed by: Rica Koyanagi, MD Pre-anesthesia Checklist: Patient identified, Emergency Drugs available, Suction available and Patient being monitored Patient Re-evaluated:Patient Re-evaluated prior to induction Oxygen Delivery Method: Circle system utilized Preoxygenation: Pre-oxygenation with 100% oxygen Induction Type: IV induction Ventilation: Mask ventilation without difficulty Laryngoscope Size: Mac and 3 Grade View: Grade II Tube size: 7.5 mm Number of attempts: 1 Airway Equipment and Method: Stylet Placement Confirmation: ETT inserted through vocal cords under direct vision,  positive ETCO2 and breath sounds checked- equal and bilateral Secured at: 22 cm Tube secured with: Tape Dental Injury: Teeth and Oropharynx as per pre-operative assessment

## 2017-06-13 NOTE — Progress Notes (Signed)
Dr Ellene Route updated on t's staus/ ok for transfer to 4np

## 2017-06-13 NOTE — Op Note (Signed)
Date of surgery: 06/13/2017 Preoperative diagnosis: Degenerative lumbar scoliosis L2-3 L3-4 L4-5 with severe stenosis, lumbar radiculopathy, neurogenic claudication.  History of previous decompression L5-S1. Postoperative diagnosis: Same Procedure: Lumbar laminectomy L2-L3-L4 decompression of the L2 L3-L4-L5 nerve roots laterally and bilaterally with more work than required for simple interbody technique.  Posterior lumbar interbody arthrodesis L2-3 L3-4 L4-5 with peek spacers local autograft allograft and infuse L2-3 L3-4 L4-5.  Segmental fixation L2-L5 with pedicle screws posterior lateral arthrodesis with local autograft allograft and infuse L2-L5. Surgeon: Kristeen Miss First assistant: Marlyce Huge MD Anesthesia: General endotracheal Indications: [name] is an 84 year old individual who has had significant back and bilateral leg pain and weakness in her legs in recent past.  She has had a previous decompression lumbar spine.  I have been following her for about 10 years and noted that she has been developing a degenerative scoliosis in the lumbar spine now with severe stenosis at multiple levels.  Is been advised regarding surgical decompression and stabilization.  Procedure: Patient was brought to the operating room supine on a stretcher.  After the smooth induction of general endotracheal anesthesia, she was turned prone.  The back was prepped with alcohol DuraPrep and draped in a sterile fashion.  An elliptical incision was made around the previous scar in the lumbar spine this was excised.  Dissection was carried down to the lumbodorsal fascia with this was opened on either side of midline.  First identifiable spinous processes were noted to be that of L3 and L4.  Then L2 was uncovered and the dissection was carried out over the facet joints at the L2-L3 space.  Laminectomy was created removing the inferior marginal lamina of L2 L2 including the entirety of the facet at the L2-3 joint.  This  was done bilaterally.  Yellow ligament was taken up in the common dural tube was exposed.  Then a total laminectomy of L3 was performed removing the entirety of the facets inferiorly and total laminectomy of L4 was completed again removing the entire facet complex at the L4-L5 joint.  This bone was used for autograft.  The dissection was then carried out to decompress the dura and decompress the individual nerve roots namely L2-L3-L4 and L5.  This was done piecemeal fashion using 2 3 and 4 mm Kerrison punch in addition to [employer].  The disc spaces were isolated.  Then a discectomy was performed at L2-3 removing the entirety of the disc and rongeuring smooth the endplates.  Interbody spacers were placed into the L2-3 and these measured 8 mm in height with 8 degrees of lordosis and 23 mm in length.  A total of 12 cc of bone graft which was a combination of autograft allograft and infuse was placed into the interspace.  The 2 cages were placed separately.  Next L3-4 was decompressed in a similar fashion and here a 12 mm tall 8 degree lordotic cages measuring 23 mm in length were used these were filled with autograft allograft and infuse and a total of 14 cc of autograft allograft and infuse was packed into the interspace.  Then at L4-L5 similar discectomy was carried out and here 10 mm tall 12 degree lordotic cages were used in place for ventrally.  The interspace was packed with a total of 16 cc of bone graft.  Once all the grafting was completed pedicle entry sites were chosen at L2-L3-L4 and L5 6.5 x 45 mm screws were placed radiographically with radiographic guidance using fluoroscopy.  Once the  screws were placed 90 mm precontoured rods were used to connect the screw heads and afford some reduction to the scoliosis.  This was placed in a neutral construct with a moderate amount of extension.  Final radiographs were obtained in AP and lateral projection demonstrated good reduction.  Then care was taken to  make sure that the common dural tube and the individual nerve roots remained well decompressed while the remainder of the graft was packed into the lateral gutters between the intertransverse space from L2-L5.  Once this was packed the retractors were removed hemostasis was checked and the soft tissues in the lumbar dorsal fascia was closed with #1 Vicryl in interrupted fashion.  After the fascia was closed 25 cc of half percent Marcaine was injected into it.  The subcutaneous tissue was closed with 2-0 Vicryl and 3-0 Vicryl was used in the subarticular skin.  Blood loss was estimated at 1200 cc, 500 cc of Cell Saver blood was returned to the patient.

## 2017-06-13 NOTE — Anesthesia Preprocedure Evaluation (Addendum)
Anesthesia Evaluation  Patient identified by MRN, date of birth, ID band Patient awake    Reviewed: Allergy & Precautions, NPO status , Patient's Chart, lab work & pertinent test results  History of Anesthesia Complications (+) PONV  Airway Mallampati: II  TM Distance: >3 FB Neck ROM: Full    Dental  (+) Partial Upper, Partial Lower   Pulmonary former smoker,    breath sounds clear to auscultation       Cardiovascular negative cardio ROS   Rhythm:Regular Rate:Normal     Neuro/Psych    GI/Hepatic Neg liver ROS, GERD  ,  Endo/Other  Hypothyroidism   Renal/GU negative Renal ROS     Musculoskeletal  (+) Arthritis ,   Abdominal   Peds  Hematology negative hematology ROS (+)   Anesthesia Other Findings   Reproductive/Obstetrics                            Anesthesia Physical Anesthesia Plan  ASA: II  Anesthesia Plan: General   Post-op Pain Management:    Induction: Intravenous  PONV Risk Score and Plan: 4 or greater and Treatment may vary due to age or medical condition, Dexamethasone and Ondansetron  Airway Management Planned: Oral ETT  Additional Equipment:   Intra-op Plan:   Post-operative Plan: Extubation in OR  Informed Consent: I have reviewed the patients History and Physical, chart, labs and discussed the procedure including the risks, benefits and alternatives for the proposed anesthesia with the patient or authorized representative who has indicated his/her understanding and acceptance.   Dental advisory given  Plan Discussed with: CRNA  Anesthesia Plan Comments:         Anesthesia Quick Evaluation

## 2017-06-13 NOTE — H&P (Signed)
[name] is an 84 y.o. female.   Chief Complaint: Back and leg pain HPI: Mrs Cilento has been followed in my clinic for over ten years. She has degenerative scoliosis in the lumbar spine that had responded well to conservative treatment until the past year. Her curve has progressed and she has developed severe stenosis from L2 to L5. After careful consideration she is admitted for surgery to decompress and stabilize these levels.  Past Medical History:  Diagnosis Date  . Arthritis   . GERD (gastroesophageal reflux disease)   . Hypothyroidism   . PONV (postoperative nausea and vomiting)   . Ptosis of both eyelids   . Vertigo   . Wears glasses    reading    Past Surgical History:  Procedure Laterality Date  . ABDOMINAL HYSTERECTOMY  1986  . [place]  2000   right  . CARPAL TUNNEL RELEASE  2012   left  . COLONOSCOPY    . CYSTOCELE REPAIR  2007   sling  . DISTAL INTERPHALANGEAL JOINT FUSION Right 02/04/2014   Procedure: FUSION DISTAL INTERPHALANGEAL JOINT RIGHT INDEX FINGER ;  Surgeon: Daryll Brod, MD;  Location: [facility];  Service: Orthopedics;  Laterality: Right;  . EYE SURGERY Bilateral    Cataract surgery with lens implant  . KNEE SURGERY  2009,2011   partial knee  . OPEN REDUCTION INTERNAL FIXATION (ORIF) PROXIMAL PHALANX Left 08/30/2013   Procedure: OPEN REDUCTION INTERNAL FIXATION (ORIF) PROXIMAL PHALANX FRACTURE LEFT SMALL FINGER; SPLINT RING FINGER;  Surgeon: Wynonia Sours, MD;  Location: [facility];  Service: Orthopedics;  Laterality: Left;  . PTOSIS REPAIR Bilateral 07/27/2015   Procedure: PTOSIS REPAIR;  Surgeon: Cristine Polio, MD;  Location: [facility];  Service: Plastics;  Laterality: Bilateral;  . SHOULDER SURGERY     rt rcr,and lt  . TONSILLECTOMY      History reviewed. No pertinent family history. Social History:  reports that she has quit smoking. she has  never used smokeless tobacco. She reports that she does not drink alcohol or use drugs.  Allergies:  Allergies  Allergen Reactions  . Keflex [Cephalexin] Hives    Medications Prior to Admission  Medication Sig Dispense Refill  . acetaminophen (TYLENOL) 500 MG tablet Take 1,000 mg by mouth daily as needed for moderate pain or headache.    . Ascorbic Acid (VITAMIN C) 1000 MG tablet Take 1,000 mg by mouth daily.    . bumetanide (BUMEX) 0.5 MG tablet Take 0.5 mg by mouth daily as needed (swelling).    . diphenhydramine-acetaminophen (TYLENOL PM) 25-500 MG TABS tablet Take 1 tablet by mouth at bedtime as needed (sleep).    Marland Kitchen levothyroxine (SYNTHROID, LEVOTHROID) 50 MCG tablet Take 50 mcg by mouth daily.    . meclizine (ANTIVERT) 25 MG tablet Take 25 mg by mouth 3 (three) times daily as needed for dizziness.    . meloxicam (MOBIC) 7.5 MG tablet Take 7.5 mg by mouth daily.    . Multiple Vitamin (MULTIVITAMIN WITH MINERALS) TABS tablet Take 1 tablet by mouth daily.    Marland Kitchen omeprazole (PRILOSEC) 20 MG capsule Take 20 mg by mouth daily.    Vladimir Faster Glycol-Propyl Glycol (SYSTANE OP) Place 1 drop into both eyes 2 (two) times daily.    . traMADol (ULTRAM) 50 MG tablet Take 50 mg by mouth daily as needed for severe pain.    . TURMERIC PO  Take 2,000 mg by mouth daily.      No results found for this or any previous visit (from the past 48 hour(s)). No results found.  Review of Systems  Constitutional: Negative.   HENT: Negative.   Eyes: Negative.   Respiratory: Negative.   Cardiovascular: Negative.   Gastrointestinal: Negative.   Genitourinary: Negative.   Musculoskeletal: Positive for back pain.  Skin: Negative.   Neurological: Positive for tingling and focal weakness.  Endo/Heme/Allergies:       Hypothyroid  Psychiatric/Behavioral: Negative.     Blood pressure (!) 161/89, pulse 64, temperature 98.7 F (37.1 C), temperature source Oral, resp. rate 20, SpO2 97 %. Physical Exam   Constitutional: She is oriented to person, place, and time. She appears well-developed and well-nourished.  HENT:  Head: Normocephalic and atraumatic.  Eyes: Conjunctivae and EOM are normal. Pupils are equal, round, and reactive to light.  Neck: Normal range of motion. Neck supple.  Cardiovascular: Normal rate and regular rhythm.  Respiratory: Effort normal and breath sounds normal.  GI: Soft. Bowel sounds are normal.  Musculoskeletal:  Notable deformity on back from scoliosis in the lumbar spine.Positive strait leg raise bilaterally. Negative Patrick maneuver bilaterally.  Neurological: She is alert and oriented to person, place, and time. She displays abnormal reflex. She exhibits normal muscle tone.  Skin: Skin is warm and dry.  Psychiatric: She has a normal mood and affect. Her behavior is normal. Judgment and thought content normal.     Assessment/Plan Severe stenosis L2 to L5 with degenerative scoliosis deformity.  Decompression and stabilization L2-5.  Earleen Newport, MD 06/13/2017, 6:27 AM

## 2017-06-13 NOTE — Progress Notes (Signed)
Dr Orene Desanctis notified of current staus sbp low 90's hr stable in 70-80's and mentation is normal/ pain controlled and nausea resolved. No further orders

## 2017-06-13 NOTE — Transfer of Care (Signed)
Immediate Anesthesia Transfer of Care Note  Patient: [name]  Procedure(s) Performed: Lumbar two-three, Lumbar three-four, Lumbar four-five Posterior lumbar interbody fusion (N/A Back)  Patient Location: PACU  Anesthesia Type:General  Level of Consciousness: drowsy  Airway & Oxygen Therapy: Patient Spontanous Breathing and Patient connected to nasal cannula oxygen  Post-op Assessment: Report given to RN and Post -op Vital signs reviewed and stable  Post vital signs: Reviewed and stable  Last Vitals:  Vitals:   06/13/17 0621 06/13/17 0622  BP:  (!) 161/89  Pulse: 64   Resp: 20   Temp: 37.1 C   SpO2: 97%     Last Pain:  Vitals:   06/13/17 0621  TempSrc: Oral  PainSc:       Patients Stated Pain Goal: 2 (35/57/32 2025)  Complications: No apparent anesthesia complications

## 2017-06-14 ENCOUNTER — Inpatient Hospital Stay (HOSPITAL_COMMUNITY): Payer: Medicare Other

## 2017-06-14 LAB — BASIC METABOLIC PANEL
Anion gap: 8 (ref 5–15)
BUN: 14 mg/dL (ref 6–20)
CO2: 23 mmol/L (ref 22–32)
Calcium: 8.3 mg/dL — ABNORMAL LOW (ref 8.9–10.3)
Chloride: 104 mmol/L (ref 101–111)
Creatinine, Ser: 0.99 mg/dL (ref 0.44–1.00)
GFR calc Af Amer: 59 mL/min — ABNORMAL LOW (ref >60)
GFR calc non Af Amer: 51 mL/min — ABNORMAL LOW (ref >60)
Glucose, Bld: 131 mg/dL — ABNORMAL HIGH (ref 65–99)
Potassium: 4.1 mmol/L (ref 3.5–5.1)
Sodium: 135 mmol/L (ref 135–145)

## 2017-06-14 LAB — CBC
HCT: 26.2 % — ABNORMAL LOW (ref 36.0–46.0)
Hemoglobin: 8.5 g/dL — ABNORMAL LOW (ref 12.0–15.0)
MCH: 30 pg (ref 26.0–34.0)
MCHC: 32.4 g/dL (ref 30.0–36.0)
MCV: 92.6 fL (ref 78.0–100.0)
Platelets: 151 10*3/uL (ref 150–400)
RBC: 2.83 MIL/uL — ABNORMAL LOW (ref 3.87–5.11)
RDW: 14.2 % (ref 11.5–15.5)
WBC: 8.9 10*3/uL (ref 4.0–10.5)

## 2017-06-14 MED ORDER — DEXAMETHASONE 6 MG PO TABS
6.0000 mg | ORAL_TABLET | Freq: Once | ORAL | Status: AC
  Administered 2017-06-14: 6 mg via ORAL
  Filled 2017-06-14: qty 1

## 2017-06-14 NOTE — Social Work (Signed)
CSW acknowledging consult for SNF placment. Per PT note, SNF is not recommended, pt is ambulating 200 Feet with minimum assistance.   CSW continuing to follow.   Alexander [place], [place] [phone]

## 2017-06-14 NOTE — Progress Notes (Signed)
Patient ID: [name], female   DOB: Mar 22, 1933, 84 y.o.   MRN: [id number] Vital signs are stable Patient complaining of significant right-sided pain CBC and b met pending Dressing is clean and dry We will add Decadron to help with pain Encouragement mobilization

## 2017-06-14 NOTE — Progress Notes (Signed)
Orthopedic Tech Progress Note Patient Details:  [name] Aug 11, 1932 [id number] Patient already has brace. Patient ID: [name], female   DOB: [birth year], 84 y.o.   MRN: [id number]   Braulio Bosch 06/14/2017, 4:19 PM

## 2017-06-14 NOTE — Evaluation (Signed)
Occupational Therapy Evaluation Patient Details Name: [name] MRN: [id number] DOB: 04-28-1932 Today's Date: 06/14/2017    History of Present Illness 84 y.o. s/p L2-5 fusion. PMH including previous decompression L5-S1, HTN, GERD, Bil carpal tunnel release, shoulder sx, partial TKA, and cataract srugery.   Clinical Impression   PTA, pt was living with her husband and was independent. Currently, pt requires Min A for brace management, Max A for LB ADLs, and Min A for functional mobility using RW. Provided education on back precautions, brace management, LB ADLs, and compensatory techniques for grooming; pt demonstrated and verbalized understanding. Pt will require further acute OT to address LB ADLs and functional transfers. Recommend dc home once medically stable per physician.     Follow Up Recommendations  No OT follow up;Supervision/Assistance - 24 hour    Equipment Recommendations  None recommended by OT    Recommendations for Other Services PT consult     Precautions / Restrictions Precautions Precautions: Back;Fall Precaution Booklet Issued: Yes (comment) Precaution Comments: Recalling 1/3 precautions and required visual cues for no lifting and no twisting Required Braces or Orthoses: Spinal Brace Spinal Brace: Applied in sitting position;Lumbar corset Restrictions Weight Bearing Restrictions: No      Mobility Bed Mobility Overal bed mobility: Needs Assistance Bed Mobility: Rolling;Sit to Sidelying Rolling: Min guard      Sit to sidelying: Min assist General bed mobility comments: Min A to manage BLEs due to pain in LLE  Transfers Overall transfer level: Needs assistance Equipment used: Rolling walker (2 wheeled) Transfers: Sit to/from Stand Sit to Stand: Min guard         General transfer comment: min guard for safety, VCs for hand placement, required 3 attempts to stand from EOB with increased time    Balance Overall balance assessment: Mild  deficits observed, not formally tested                                         ADL either performed or assessed with clinical judgement   ADL Overall ADL's : Needs assistance/impaired Eating/Feeding: Set up;Sitting   Grooming: Set up;Sitting Grooming Details (indicate cue type and reason): Providing education on compensatory techniques for adherance to back precautions during grooming Upper Body Bathing: Min guard;Sitting Upper Body Bathing Details (indicate cue type and reason): Educating on compensatory techniques for adherance to back precautions Lower Body Bathing: Maximal assistance;Sit to/from stand Lower Body Bathing Details (indicate cue type and reason): Pt will require further education for LB ADLs Upper Body Dressing : Minimal assistance;Sitting Upper Body Dressing Details (indicate cue type and reason): Min A for correct positioning of back brace Lower Body Dressing: Maximal assistance;Sit to/from stand Lower Body Dressing Details (indicate cue type and reason): Unable to bring ankles to knees Toilet Transfer: Minimal assistance;Ambulation;RW(Simulated in room)     Toileting - Clothing Manipulation Details (indicate cue type and reason): Will need education on toilet hygiene and compensatory techniques. Pt reports she has purchased a toilet aide     Functional mobility during ADLs: Minimal assistance;Rolling walker General ADL Comments: Providing education on back precautions, brace management, bed mobility, and LB ADLs.     Vision         Perception     Praxis      Pertinent Vitals/Pain Pain Assessment: 0-10 Pain Score: 10-Worst pain ever Pain Location: incision, left leg Pain Descriptors / Indicators: Aching;Discomfort;Grimacing;Spasm;Tingling Pain Intervention(s): Monitored  during session;Limited activity within patient's tolerance;Premedicated before session;Repositioned     Hand Dominance Right   Extremity/Trunk Assessment Upper  Extremity Assessment Upper Extremity Assessment: Overall WFL for tasks assessed   Lower Extremity Assessment Lower Extremity Assessment: Defer to PT evaluation   Cervical / Trunk Assessment Cervical / Trunk Assessment: Other exceptions Cervical / Trunk Exceptions: s/p spinal sx   Communication Communication Communication: No difficulties   Cognition Arousal/Alertness: Awake/alert Behavior During Therapy: WFL for tasks assessed/performed Overall Cognitive Status: Within Functional Limits for tasks assessed                                     General Comments  VSS throughout    Exercises     Shoulder Instructions      Home Living Family/patient expects to be discharged to:: Private residence Living Arrangements: Spouse/significant other Available Help at Discharge: Family Type of Home: House Home Access: Stairs to enter Technical brewer of Steps: 2 Entrance Stairs-Rails: Right Home Layout: Multi-level Alternate Level Stairs-Number of Steps: 4 Alternate Level Stairs-Rails: Right Bathroom Shower/Tub: Walk-in shower;Tub/shower unit   Bathroom Toilet: Standard     Home Equipment: Cane - single point;Tub bench;Walker - 2 wheels          Prior Functioning/Environment Level of Independence: Independent        Comments: drives, does all ADLs and IADLs, husband has been doing  housework lately due to low back pain        OT Problem List: Decreased strength;Decreased range of motion;Decreased activity tolerance;Impaired balance (sitting and/or standing);Decreased safety awareness;Decreased knowledge of use of DME or AE;Decreased knowledge of precautions;Pain      OT Treatment/Interventions: Self-care/ADL training;Therapeutic exercise;Energy conservation;DME and/or AE instruction;Therapeutic activities;Patient/family education    OT Goals(Current goals can be found in the care plan section) Acute Rehab OT Goals Patient Stated Goal: to have less  pain OT Goal Formulation: With patient Time For Goal Achievement: 06/28/17 Potential to Achieve Goals: Good ADL Goals Pt Will Perform Upper Body Dressing: with set-up;with supervision;sitting Pt Will Perform Lower Body Dressing: with set-up;with supervision;sit to/from stand;with adaptive equipment Pt Will Transfer to Toilet: with set-up;with supervision;ambulating;regular height toilet Pt Will Perform Toileting - Clothing Manipulation and hygiene: with set-up;with supervision;with adaptive equipment;sit to/from stand Pt Will Perform Tub/Shower Transfer: Shower transfer;with set-up;with supervision;rolling walker;shower seat;ambulating Additional ADL Goal #1: Pt will independently verbalize 3/3 back precautions  OT Frequency: Min 2X/week   Barriers to D/C:            Co-evaluation              AM-PAC PT "6 Clicks" Daily Activity     Outcome Measure Help from another person eating meals?: None Help from another person taking care of personal grooming?: A Little Help from another person toileting, which includes using toliet, bedpan, or urinal?: A Little Help from another person bathing (including washing, rinsing, drying)?: A Lot Help from another person to put on and taking off regular upper body clothing?: A Little Help from another person to put on and taking off regular lower body clothing?: A Lot 6 Click Score: 17   End of Session Equipment Utilized During Treatment: Rolling walker;Back brace Nurse Communication: Mobility status(nauseous and pain)  Activity Tolerance: Patient tolerated treatment well(Limited by nausea) Patient left: in bed;with call bell/phone within reach  OT Visit Diagnosis: Unsteadiness on feet (R26.81);Other abnormalities of gait and mobility (R26.89);Muscle weakness (generalized) (M62.81);Pain  Pain - part of body: (Back)                Time: 3419-6222 OT Time Calculation (min): 20 min Charges:  OT General Charges $OT Visit: 1 Visit OT  Evaluation $OT Eval Moderate Complexity: 1 Mod G-Codes:     [facility] MSOT, OTR/L Acute Rehab Pager: [phone] Office: [place] 06/14/2017, 11:27 AM

## 2017-06-14 NOTE — Evaluation (Signed)
Physical Therapy Evaluation Patient Details Name: [name] MRN: [id number] DOB: Apr 11, 1932 Today's Date: 06/14/2017   History of Present Illness  84 y.o. s/p L2-5 fusion. PMH including previous decompression L5-S1, HTN, GERD, Bil carpal tunnel release, shoulder sx, partial TKA, and cataract srugery.  Clinical Impression  Pt presents with overall decrease in functional mobility secondary to above including impairments listed below (see PT Problem List). Pt doing well overall and mostly limited by pain at this time. Pt's brace has not arrived yet, but able to mobilize with PT per Dr. Ellene Route without brace. She is min guard with all functional mobility. Educated pt on back precautions and provided handout. Anticipate pt will continue to progress with frequent mobility at home and will not require any follow up PT services. Pt to benefit from continued acute PT to maximize safety, functional mobility, and independence prior to d/c home.      Follow Up Recommendations No PT follow up;Supervision - Intermittent    Equipment Recommendations  None recommended by PT    Recommendations for Other Services       Precautions / Restrictions Precautions Precautions: Back;Fall Precaution Booklet Issued: Yes (comment) Precaution Comments: reviewed BLT precautions Required Braces or Orthoses: Spinal Brace Spinal Brace: Applied in sitting position;Lumbar corset Restrictions Weight Bearing Restrictions: No      Mobility  Bed Mobility Overal bed mobility: Needs Assistance Bed Mobility: Rolling;Sidelying to Sit Rolling: Min guard Sidelying to sit: Min guard       General bed mobility comments: min guard for safety, VCs for sequencing and technique, increased effort and time for sidelying to sit  Transfers Overall transfer level: Needs assistance Equipment used: Rolling walker (2 wheeled) Transfers: Sit to/from Stand Sit to Stand: Min guard         General transfer comment: min  guard for safety, VCs for hand placement, required 3 attempts to stand from EOB with increased time  Ambulation/Gait Ambulation/Gait assistance: Min guard Ambulation Distance (Feet): 200 Feet Assistive device: Rolling walker (2 wheeled) Gait Pattern/deviations: Step-through pattern;Trunk flexed Gait velocity: decreased   General Gait Details: pt able to manage RW safely, VCs for cadence and upright posture. pt had to take standing rest breaks approximately every 20 feet because of "grabbing" pain in L LE  Stairs            Wheelchair Mobility    Modified Rankin (Stroke Patients Only)       Balance Overall balance assessment: Mild deficits observed, not formally tested                                           Pertinent Vitals/Pain Pain Assessment: 0-10 Pain Score: 10-Worst pain ever Pain Location: incision, left leg Pain Descriptors / Indicators: Aching;Discomfort;Grimacing;Spasm;Tingling Pain Intervention(s): Monitored during session;Limited activity within patient's tolerance;Repositioned;Patient requesting pain meds-RN notified    Home Living Family/patient expects to be discharged to:: Private residence Living Arrangements: Spouse/significant other Available Help at Discharge: Family Type of Home: House Home Access: Stairs to enter Entrance Stairs-Rails: Right Entrance Stairs-Number of Steps: 2 Home Layout: Multi-level Home Equipment: Cane - single point;Tub bench;Walker - 2 wheels      Prior Function Level of Independence: Independent         Comments: drives, does all ADLs and IADLs, husband has been doing  housework lately due to low back pain     Hand Dominance  Dominant Hand: Right    Extremity/Trunk Assessment   Upper Extremity Assessment Upper Extremity Assessment: Defer to OT evaluation    Lower Extremity Assessment Lower Extremity Assessment: Generalized weakness       Communication      Cognition  Arousal/Alertness: Awake/alert Behavior During Therapy: WFL for tasks assessed/performed Overall Cognitive Status: Within Functional Limits for tasks assessed                                        General Comments General comments (skin integrity, edema, etc.): VSS throughout    Exercises     Assessment/Plan    PT Assessment Patient needs continued PT services  PT Problem List Decreased strength;Decreased balance;Decreased mobility;Decreased knowledge of use of DME;Pain       PT Treatment Interventions DME instruction;Gait training;Stair training;Functional mobility training;Therapeutic activities;Therapeutic exercise;Balance training;Neuromuscular re-education;Patient/family education;Manual techniques;Modalities    PT Goals (Current goals can be found in the Care Plan section)  Acute Rehab PT Goals Patient Stated Goal: to have less pain PT Goal Formulation: With patient Time For Goal Achievement: 06/28/17 Potential to Achieve Goals: Good    Frequency Min 5X/week   Barriers to discharge        Co-evaluation               AM-PAC PT "6 Clicks" Daily Activity  Outcome Measure Difficulty turning over in bed (including adjusting bedclothes, sheets and blankets)?: None Difficulty moving from lying on back to sitting on the side of the bed? : A Little Difficulty sitting down on and standing up from a chair with arms (e.g., wheelchair, bedside commode, etc,.)?: None Help needed moving to and from a bed to chair (including a wheelchair)?: None Help needed walking in hospital room?: None Help needed climbing 3-5 steps with a railing? : A Little 6 Click Score: 22    End of Session Equipment Utilized During Treatment: Gait belt Activity Tolerance: Patient tolerated treatment well;Patient limited by pain Patient left: in chair;with call bell/phone within reach Nurse Communication: Mobility status PT Visit Diagnosis: Unsteadiness on feet (R26.81);Muscle  weakness (generalized) (M62.81);Difficulty in walking, not elsewhere classified (R26.2);Pain Pain - Right/Left: Left Pain - part of body: Leg(back)    Time: 2683-4196 PT Time Calculation (min) (ACUTE ONLY): 26 min   Charges:   PT Evaluation $PT Eval Moderate Complexity: 1 Mod PT Treatments $Gait Training: 8-22 mins   PT G Codes:        Vic Ripper, SPT  Vic Ripper 06/14/2017, 9:51 AM

## 2017-06-15 ENCOUNTER — Inpatient Hospital Stay (HOSPITAL_COMMUNITY): Payer: Medicare Other

## 2017-06-15 LAB — PREPARE RBC (CROSSMATCH)

## 2017-06-15 MED ORDER — SODIUM CHLORIDE 0.9 % IV SOLN
Freq: Once | INTRAVENOUS | Status: AC
  Administered 2017-06-15: 10:00:00 via INTRAVENOUS

## 2017-06-15 MED ORDER — DEXAMETHASONE 2 MG PO TABS
2.0000 mg | ORAL_TABLET | Freq: Two times a day (BID) | ORAL | Status: DC
  Administered 2017-06-15 – 2017-06-17 (×5): 2 mg via ORAL
  Filled 2017-06-15 (×5): qty 1

## 2017-06-15 MED FILL — Heparin Sodium (Porcine) Inj 1000 Unit/ML: INTRAMUSCULAR | Qty: 60 | Status: AC

## 2017-06-15 MED FILL — Sodium Chloride IV Soln 0.9%: INTRAVENOUS | Qty: 2000 | Status: AC

## 2017-06-15 NOTE — Progress Notes (Signed)
Occupational Therapy Treatment Patient Details Name: [name] MRN: [id number] DOB: [birth year] Today's Date: 06/15/2017    History of present illness 84 y.o. s/p L2-5 fusion. PMH including previous decompression L5-S1, HTN, GERD, Bil carpal tunnel release, shoulder sx, partial TKA, and cataract srugery.   OT comments  Pt making good progress with adls. Pt in less pain today and performing well with adls. Pt introduced to reacher, sock aid and long sponge and husband to purchase these items in gift store to make adls easier for pt. Pt ambulated to bathroom and toileted with min guard and is tolerating increased activity. Pt provided with elastic laces to make donning shoes safer.   Will continue to follow.   Follow Up Recommendations  No OT follow up;Supervision/Assistance - 24 hour    Equipment Recommendations  Other (comment)(adaptive equipment pack from gift store)    Recommendations for Other Services      Precautions / Restrictions Precautions Precautions: Back;Fall Precaution Comments: Recalled 2/3 precautions. Required Braces or Orthoses: Spinal Brace Spinal Brace: Applied in sitting position;Lumbar corset Restrictions Weight Bearing Restrictions: No       Mobility Bed Mobility Overal bed mobility: Needs Assistance     Sidelying to sit: Min guard       General bed mobility comments: Pt able to get to EOB with HOB at 40 degrees.  Transfers Overall transfer level: Needs assistance Equipment used: Rolling walker (2 wheeled) Transfers: Sit to/from Stand Sit to Stand: Min guard         General transfer comment: min guard for safety, VCs for hand placement, required 3 attempts to stand from EOB with increased time    Balance Overall balance assessment: Mild deficits observed, not formally tested                                         ADL either performed or assessed with clinical judgement   ADL Overall ADL's : Needs  assistance/impaired Eating/Feeding: Independent;Sitting   Grooming: Supervision/safety;Standing;Wash/dry face;Oral care Grooming Details (indicate cue type and reason): walked to sink Upper Body Bathing: Supervision/ safety;Sitting   Lower Body Bathing: Moderate assistance;Sit to/from stand;Cueing for compensatory techniques;With adaptive equipment Lower Body Bathing Details (indicate cue type and reason): Pt introduced to long sponge to wash back while sitting or standing Upper Body Dressing : Minimal assistance;Sitting Upper Body Dressing Details (indicate cue type and reason): min assist to move brace lower on back than originally placed. Cues to keep back brace tight. Lower Body Dressing: Moderate assistance;Cueing for compensatory techniques;With adaptive equipment;Sit to/from stand Lower Body Dressing Details (indicate cue type and reason): Pt introduced to reacher and sock aid to dress LE and had great success with this. Pts husband will purchase AE pack in gift store.  Pt also provided with elastic shoe laces since most of her shoes tie.  pt does have one pair of slip on [place] she can wear.  Toilet Transfer: Min guard;BSC;Regular Toilet;Grab bars;RW Armed forces technical officer Details (indicate cue type and reason): Pt walked to bathroom with walker and min guard. Toileting- Clothing Manipulation and Hygiene: Minimal assistance;Sit to/from stand;Cueing for compensatory techniques Toileting - Clothing Manipulation Details (indicate cue type and reason): Pt has a toilet aid at home. pt educated on use of toilet aid and may or may not need it. pt with longer arms and can reach without twisting but for comfort may use it initially.  Functional mobility during ADLs: Min guard;Rolling walker General ADL Comments: Providing education on back precautions, brace management, bed mobility, and LB ADLs.     Vision   Vision Assessment?: No apparent visual deficits   Perception     Praxis       Cognition Arousal/Alertness: Awake/alert Behavior During Therapy: WFL for tasks assessed/performed Overall Cognitive Status: Within Functional Limits for tasks assessed                                          Exercises     Shoulder Instructions       General Comments Pt in less pain today and moving more safely    Pertinent Vitals/ Pain       Pain Assessment: Faces Faces Pain Scale: Hurts little more Pain Location: back pain Pain Descriptors / Indicators: Aching;Discomfort;Operative site guarding Pain Intervention(s): Limited activity within patient's tolerance;Monitored during session;Premedicated before session;Repositioned  Home Living                                          Prior Functioning/Environment              Frequency  Min 2X/week        Progress Toward Goals  OT Goals(current goals can now be found in the care plan section)  Progress towards OT goals: Progressing toward goals  Acute Rehab OT Goals Patient Stated Goal: to have less pain OT Goal Formulation: With patient Time For Goal Achievement: 06/28/17 Potential to Achieve Goals: Good ADL Goals Pt Will Perform Upper Body Dressing: with set-up;with supervision;sitting Pt Will Perform Lower Body Dressing: with set-up;with supervision;sit to/from stand;with adaptive equipment Pt Will Transfer to Toilet: with set-up;with supervision;ambulating;regular height toilet Pt Will Perform Toileting - Clothing Manipulation and hygiene: with set-up;with supervision;with adaptive equipment;sit to/from stand Pt Will Perform Tub/Shower Transfer: Shower transfer;with set-up;with supervision;rolling walker;shower seat;ambulating Additional ADL Goal #1: Pt will independently verbalize 3/3 back precautions  Plan Discharge plan remains appropriate    Co-evaluation                 AM-PAC PT "6 Clicks" Daily Activity     Outcome Measure   Help from another person  eating meals?: None Help from another person taking care of personal grooming?: None Help from another person toileting, which includes using toliet, bedpan, or urinal?: A Little Help from another person bathing (including washing, rinsing, drying)?: A Little Help from another person to put on and taking off regular upper body clothing?: A Little Help from another person to put on and taking off regular lower body clothing?: A Little 6 Click Score: 20    End of Session Equipment Utilized During Treatment: Rolling walker;Back brace  OT Visit Diagnosis: Unsteadiness on feet (R26.81);Other abnormalities of gait and mobility (R26.89);Muscle weakness (generalized) (M62.81);Pain Pain - part of body: (back)   Activity Tolerance Patient tolerated treatment well   Patient Left in chair;with call bell/phone within reach   Nurse Communication Mobility status        Time: 6160-7371 OT Time Calculation (min): 24 min  Charges: OT General Charges $OT Visit: 1 Visit OT Treatments $Self Care/Home Management : 23-37 mins  Jinger Neighbors, OTR/L [phone]   Glenford Peers 06/15/2017, 9:29 AM

## 2017-06-15 NOTE — Progress Notes (Addendum)
Physical Therapy Treatment Patient Details Name: [name] MRN: [id number] DOB: [birth year] Today's Date: 07/12/17    History of Present Illness      PT Comments    Pt making good progress.  Emphasis on education, building momentum to roll without the rail, transfer safety, donning the brace and gait stability.    Follow Up Recommendations        Equipment Recommendations       Recommendations for Other Services       Precautions / Restrictions      Mobility  Bed Mobility                  Transfers                    Ambulation/Gait                 Stairs            Wheelchair Mobility    Modified Rankin (Stroke Patients Only)       Balance                                            Cognition                                              Exercises      General Comments        Pertinent Vitals/Pain      Home Living                      Prior Function            PT Goals (current goals can now be found in the care plan section)      Frequency           PT Plan      Co-evaluation              AM-PAC PT "6 Clicks" Daily Activity  Outcome Measure                   End of Session               Time:  -     Charges:                       G Codes:       07/12/17  Donnella Rowe, PT [phone]  (pager)   [name] 2017-07-12, 3:29 PM

## 2017-06-15 NOTE — Care Management Note (Signed)
Case Management Note  Patient Details  Name: DENICE CARDON MRN: [id number] Date of Birth: 06-05-1932  Subjective/Objective:   84 y.o. s/p L2-5 fusion on 06/13/17.  PTA, pt independent, lives with spouse.               Action/Plan: PT/OT recommending no OP follow up or DME.  Likely dc home on 06/16/17.  Will follow progress.  Expected Discharge Date:  (Pending)               Expected Discharge Plan:  Home/Self Care  In-House Referral:     Discharge planning Services  CM Consult  Post Acute Care Choice:    Choice offered to:     DME Arranged:    DME Agency:     HH Arranged:    HH Agency:     Status of Service:  In process, will continue to follow  If discussed at Long Length of Stay Meetings, dates discussed:    Additional Comments:  Reinaldo Raddle, RN, BSN  Trauma/Neuro ICU Case Manager [phone]

## 2017-06-15 NOTE — Progress Notes (Signed)
Patient ID: [name], female   DOB: [birth year], 84 y.o.   MRN: [id number] Vital signs show blood pressure 235-361 systolic with heart rate 44-31 at rest but becomes tachycardic with slight exertion.  Patient notes she feels lightheaded when she stands even briefly to walk to the bedside commode.  She has been able to walk 200 feet out in the hall.  Her postoperative and post transfusion hematocrit is 26.  I suggested that she would likely feel stronger with another unit of blood being transfused and we will do this today.  On that patient complains bitterly of left hip pain.  Her x-rays of the lumbar spine look good in the alignment of her back is improved.  Nonetheless because of the left sided hip and buttock pain I have suggested that we do an x-ray of the left hip joint itself.  I will obtain this now.  We will continue low-dose Decadron 2 mg twice daily.

## 2017-06-16 MED ORDER — DEXAMETHASONE 1 MG PO TABS: ORAL_TABLET | ORAL | 0 refills | Status: DC

## 2017-06-16 MED ORDER — OXYCODONE-ACETAMINOPHEN 5-325 MG PO TABS: 1.0000 | ORAL_TABLET | ORAL | 0 refills | Status: DC | PRN

## 2017-06-16 MED ORDER — METHOCARBAMOL 500 MG PO TABS: 500.0000 mg | ORAL_TABLET | Freq: Four times a day (QID) | ORAL | 3 refills | Status: DC | PRN

## 2017-06-16 NOTE — Discharge Summary (Signed)
Physician Discharge Summary  Patient ID: [name] MRN: [id number] DOB/AGE: [birth year] 84 y.o.  Admit date: 06/13/2017 Discharge date: 06/16/2017  Admission Diagnoses: Lumbar spondylosis and stenosis with degenerative scoliosis L2-3 L3-4 L4-5.  History of fusion L5-S1.  Discharge Diagnoses: Lumbar spondylosis and stenosis with degenerative scoliosis L2-3 L3-4 L4-5.  History of fusion L5-S1.  Acute blood loss anemia.  Status post transfusion.  Left hip pain. Active Problems:   Scoliosis   Discharged Condition: good  Hospital Course: Patient was admitted to undergo surgical decompression and stabilization at L2-3 L3-4 and L4-5.  She tolerated surgery well.  She did have significant acute blood loss anemia with a hemoglobin down into the sevens.  She required 2 units of transfusion to restore her to more normal hemoglobin without being symptomatic.  Consults: None  Significant Diagnostic Studies: None  Treatments: surgery: Laminectomy decompression L2-3 L3-4 L4-5 with posterior segmental fixation L2-L5.  Discharge Exam: Blood pressure 139/60, pulse 68, temperature 98.7 F (37.1 C), temperature source Oral, resp. rate 18, height 5\' 6"  (1.676 m), weight 82.6 kg (182 lb), SpO2 95 %. Incision is clean and dry.  Station and gait are intact.  Disposition: Discharge disposition: 01-Home or Self Care       Discharge Instructions    Call MD for:  redness, tenderness, or signs of infection (pain, swelling, redness, odor or green/yellow discharge around incision site)   Complete by:  As directed    Call MD for:  severe uncontrolled pain   Complete by:  As directed    Call MD for:  temperature >100.4   Complete by:  As directed    Diet - low sodium heart healthy   Complete by:  As directed    Incentive spirometry RT   Complete by:  As directed    Increase activity slowly   Complete by:  As directed      Allergies as of 06/16/2017      Reactions   Keflex [cephalexin] Hives       Medication List    TAKE these medications   acetaminophen 500 MG tablet Commonly known as:  TYLENOL Take 1,000 mg by mouth daily as needed for moderate pain or headache.   bumetanide 0.5 MG tablet Commonly known as:  BUMEX Take 0.5 mg by mouth daily as needed (swelling).   dexamethasone 1 MG tablet Commonly known as:  DECADRON 2 tablets twice daily for 2 days, one tablet twice daily for 2 days, one tablet daily for 2 days.   diphenhydramine-acetaminophen 25-500 MG Tabs tablet Commonly known as:  TYLENOL PM Take 1 tablet by mouth at bedtime as needed (sleep).   levothyroxine 50 MCG tablet Commonly known as:  SYNTHROID, LEVOTHROID Take 50 mcg by mouth daily.   meclizine 25 MG tablet Commonly known as:  ANTIVERT Take 25 mg by mouth 3 (three) times daily as needed for dizziness.   meloxicam 7.5 MG tablet Commonly known as:  MOBIC Take 7.5 mg by mouth daily.   methocarbamol 500 MG tablet Commonly known as:  ROBAXIN Take 1 tablet (500 mg total) by mouth every 6 (six) hours as needed for muscle spasms.   multivitamin with minerals Tabs tablet Take 1 tablet by mouth daily.   omeprazole 20 MG capsule Commonly known as:  PRILOSEC Take 20 mg by mouth daily.   oxyCODONE-acetaminophen 5-325 MG tablet Commonly known as:  PERCOCET/ROXICET Take 1-2 tablets by mouth every 3 (three) hours as needed for severe pain.   SYSTANE OP Place  1 drop into both eyes 2 (two) times daily.   traMADol 50 MG tablet Commonly known as:  ULTRAM Take 50 mg by mouth daily as needed for severe pain.   TURMERIC PO Take 2,000 mg by mouth daily.   vitamin C 1000 MG tablet Take 1,000 mg by mouth daily.        SignedEarleen Newport 06/16/2017, 5:00 PM

## 2017-06-16 NOTE — Care Management Important Message (Signed)
Important Message  Patient Details  Name: [name] MRN: [id number] Date of Birth: 17-Oct-1932   Medicare Important Message Given:  Yes    Caspar Favila Montine Circle 06/16/2017, 8:47 AM

## 2017-06-16 NOTE — Progress Notes (Signed)
Patient ID: [name], female   DOB: 01/06/1933, 85 y.o.   MRN: [id number] Patient has been improving however still having significant left hip pain.  New x-ray demonstrates that she does have some arthritis in the left hip itself Her incision is clean and dry and she is ambulatory She does have some personal needs for home which will be met but she would like to be discharged tomorrow as opposed to today as the weather is bad is already late in the afternoon.  We will plan on discharge in the morning. 

## 2017-06-16 NOTE — Progress Notes (Signed)
Physical Therapy Treatment Patient Details Name: [name] MRN: [id number] DOB: 1932-11-13 Today's Date: 06/16/2017    History of Present Illness 84 y.o. s/p L2-5 fusion. PMH including previous decompression L5-S1, HTN, GERD, Bil carpal tunnel release, shoulder sx, partial TKA, and cataract srugery.    PT Comments    Pt and husband should be ready to manage safely at home.  Education completed on bed mobility, donning the brace, lifting restrictions and typical progression of activity.   Follow Up Recommendations        Equipment Recommendations  3in1 (PT)    Recommendations for Other Services       Precautions / Restrictions Precautions Precautions: Back;Fall Precaution Booklet Issued: Yes (comment) Precaution Comments: Recalled 3/3 precautions. Required Braces or Orthoses: Spinal Brace Spinal Brace: Applied in sitting position;Lumbar corset    Mobility  Bed Mobility Overal bed mobility: Needs Assistance Bed Mobility: Sidelying to Sit;Sit to Sidelying Rolling: Min guard Sidelying to sit: Min guard     Sit to sidelying: Min assist General bed mobility comments: demo'd and practiced building momentum to get oob without significant assist  Transfers Overall transfer level: Needs assistance Equipment used: Rolling walker (2 wheeled) Transfers: Sit to/from Stand Sit to Stand: Min guard         General transfer comment: cues for hand placement and technique  Ambulation/Gait Ambulation/Gait assistance: Min guard Ambulation Distance (Feet): 250 Feet Assistive device: Rolling walker (2 wheeled) Gait Pattern/deviations: Step-through pattern Gait velocity: decreased Gait velocity interpretation: Below normal speed for age/gender General Gait Details: antalgia starting to disappear.  Steady with RW   Stairs Stairs: Yes   Stair Management: Alternating pattern;One rail Right;Forwards Number of Stairs: 4 General stair comments: safe with rail, but  tentative.  Wheelchair Mobility    Modified Rankin (Stroke Patients Only)       Balance Overall balance assessment: Needs assistance Sitting-balance support: No upper extremity supported Sitting balance-Leahy Scale: Good     Standing balance support: No upper extremity supported;Bilateral upper extremity supported Standing balance-Leahy Scale: Fair                              Cognition Arousal/Alertness: Awake/alert Behavior During Therapy: WFL for tasks assessed/performed Overall Cognitive Status: Within Functional Limits for tasks assessed                                        Exercises      General Comments General comments (skin integrity, edema, etc.): Reinforced to pt/husband all back education including bed mobility, lifting restrictions,and progresion of activity.      Pertinent Vitals/Pain Pain Assessment: Faces Faces Pain Scale: Hurts little more Pain Location: back pain Pain Descriptors / Indicators: Guarding;Grimacing    Home Living                      Prior Function            PT Goals (current goals can now be found in the care plan section) Acute Rehab PT Goals Patient Stated Goal: to have less pain PT Goal Formulation: With patient Time For Goal Achievement: 06/28/17 Potential to Achieve Goals: Good Progress towards PT goals: Progressing toward goals    Frequency    Min 5X/week      PT Plan Current plan remains appropriate    Co-evaluation  AM-PAC PT "6 Clicks" Daily Activity  Outcome Measure  Difficulty turning over in bed (including adjusting bedclothes, sheets and blankets)?: A Little Difficulty moving from lying on back to sitting on the side of the bed? : A Little Difficulty sitting down on and standing up from a chair with arms (e.g., wheelchair, bedside commode, etc,.)?: A Little Help needed moving to and from a bed to chair (including a wheelchair)?: A Little Help  needed walking in hospital room?: A Little Help needed climbing 3-5 steps with a railing? : A Little 6 Click Score: 18    End of Session   Activity Tolerance: Patient tolerated treatment well Patient left: in chair;with call bell/phone within reach Nurse Communication: Mobility status PT Visit Diagnosis: Other abnormalities of gait and mobility (R26.89) Pain - Right/Left: Left Pain - part of body: Leg     Time: 3570-1779 PT Time Calculation (min) (ACUTE ONLY): 30 min  Charges:  $Gait Training: 8-22 mins $Therapeutic Activity: 8-22 mins                    G Codes:       July 15, 2017  [name], PT [phone]  (pager)   Tessie Fass Bengie Kaucher 07/15/17, 4:36 PM

## 2017-06-17 LAB — TYPE AND SCREEN
ABO/RH(D): A POS
Antibody Screen: NEGATIVE
Unit division: 0
Unit division: 0
Unit division: 0

## 2017-06-17 LAB — BPAM RBC
Blood Product Expiration Date: 201903292359
Blood Product Expiration Date: 201903292359
Blood Product Expiration Date: 201903302359
ISSUE DATE / TIME: 201903121503
ISSUE DATE / TIME: 201903141157
Unit Type and Rh: 6200
Unit Type and Rh: 6200
Unit Type and Rh: 6200

## 2017-06-17 NOTE — Progress Notes (Signed)
Physical Therapy Treatment Patient Details Name: [name] MRN: [id number] DOB: 1933/04/03 Today's Date: 06/17/2017    History of Present Illness 84 y.o. s/p L2-5 fusion. PMH including previous decompression L5-S1, HTN, GERD, Bil carpal tunnel release, shoulder sx, partial TKA, and cataract srugery.    PT Comments    Patient seen for mobility progression s/p spinal surgery. Mobilizing well. Educated patient on precautions, mobility expectations, safety and car transfers. Anticipate patient safe for d/c home   Follow Up Recommendations        Equipment Recommendations  3in1 (PT)    Recommendations for Other Services       Precautions / Restrictions Precautions Precautions: Back;Fall Precaution Booklet Issued: Yes (comment) Precaution Comments: Recalled 3/3 precautions. Required Braces or Orthoses: Spinal Brace Spinal Brace: Applied in sitting position;Lumbar corset    Mobility  Bed Mobility               General bed mobility comments: received at EOB  Transfers Overall transfer level: Needs assistance Equipment used: Rolling walker (2 wheeled) Transfers: Sit to/from Stand Sit to Stand: Supervision         General transfer comment: Vcs for hand placement, no physical assist required  Ambulation/Gait Ambulation/Gait assistance: Supervision Ambulation Distance (Feet): 240 Feet Assistive device: Rolling walker (2 wheeled) Gait Pattern/deviations: Step-through pattern Gait velocity: decreased   General Gait Details: Improved cadence during ambulation, steady with use of RW   Stairs            Wheelchair Mobility    Modified Rankin (Stroke Patients Only)       Balance Overall balance assessment: Needs assistance Sitting-balance support: No upper extremity supported Sitting balance-Leahy Scale: Good     Standing balance support: No upper extremity supported;Bilateral upper extremity supported Standing balance-Leahy Scale: Fair                               Cognition Arousal/Alertness: Awake/alert Behavior During Therapy: WFL for tasks assessed/performed Overall Cognitive Status: Within Functional Limits for tasks assessed                                        Exercises      General Comments General comments (skin integrity, edema, etc.): educated on car transfers, mobility expectations and addressed questions related to home       Pertinent Vitals/Pain Pain Assessment: Faces Faces Pain Scale: Hurts little more Pain Location: low back Pain Descriptors / Indicators: Guarding;Grimacing Pain Intervention(s): Monitored during session    Home Living                      Prior Function            PT Goals (current goals can now be found in the care plan section) Acute Rehab PT Goals Patient Stated Goal: to have less pain PT Goal Formulation: With patient Time For Goal Achievement: 06/28/17 Potential to Achieve Goals: Good Progress towards PT goals: Progressing toward goals    Frequency    Min 5X/week      PT Plan Current plan remains appropriate    Co-evaluation              AM-PAC PT "6 Clicks" Daily Activity  Outcome Measure  Difficulty turning over in bed (including adjusting bedclothes, sheets and blankets)?: A Little Difficulty moving  from lying on back to sitting on the side of the bed? : A Little Difficulty sitting down on and standing up from a chair with arms (e.g., wheelchair, bedside commode, etc,.)?: A Little Help needed moving to and from a bed to chair (including a wheelchair)?: A Little Help needed walking in hospital room?: A Little Help needed climbing 3-5 steps with a railing? : A Little 6 Click Score: 18    End of Session   Activity Tolerance: Patient tolerated treatment well Patient left: in chair;with call bell/phone within reach Nurse Communication: Mobility status PT Visit Diagnosis: Other abnormalities of gait and  mobility (R26.89) Pain - Right/Left: Left Pain - part of body: Leg     Time: 9563-8756 PT Time Calculation (min) (ACUTE ONLY): 19 min  Charges:  $Gait Training: 8-22 mins                    G Codes:       Alben Deeds, PT DPT  Board Certified Neurologic Specialist [place] 06/17/2017, 9:10 AM

## 2017-06-17 NOTE — Progress Notes (Signed)
Per Pt. Partial bath done and will take complete bath once she arrives home today.

## 2017-06-17 NOTE — Progress Notes (Signed)
Patient discharged vi wheelchair home with husband and son. Belongings taken by patient bedside commode, 3 prescription Decadron,Precocet and Robaxcin,

## 2017-07-07 ENCOUNTER — Other Ambulatory Visit: Payer: Self-pay | Admitting: Neurological Surgery

## 2017-07-07 DIAGNOSIS — M48061 Spinal stenosis, lumbar region without neurogenic claudication: Secondary | ICD-10-CM

## 2017-07-13 ENCOUNTER — Other Ambulatory Visit (HOSPITAL_COMMUNITY): Payer: Self-pay | Admitting: Neurological Surgery

## 2017-07-13 DIAGNOSIS — M79605 Pain in left leg: Secondary | ICD-10-CM

## 2017-07-13 DIAGNOSIS — M7989 Other specified soft tissue disorders: Principal | ICD-10-CM

## 2017-07-14 ENCOUNTER — Other Ambulatory Visit: Payer: Self-pay | Admitting: Neurological Surgery

## 2017-07-14 ENCOUNTER — Encounter (HOSPITAL_COMMUNITY): Payer: Self-pay | Admitting: *Deleted

## 2017-07-14 ENCOUNTER — Ambulatory Visit
Admission: RE | Admit: 2017-07-14 | Discharge: 2017-07-14 | Disposition: A | Discharge status: Home / Self Care | Payer: Medicare Other | Source: Ambulatory Visit | Attending: Neurological Surgery | Admitting: Neurological Surgery

## 2017-07-14 ENCOUNTER — Other Ambulatory Visit: Payer: Self-pay

## 2017-07-14 ENCOUNTER — Ambulatory Visit (HOSPITAL_BASED_OUTPATIENT_CLINIC_OR_DEPARTMENT_OTHER)
Admission: RE | Admit: 2017-07-14 | Discharge: 2017-07-14 | Disposition: A | Discharge status: Home / Self Care | Payer: Medicare Other | Source: Ambulatory Visit

## 2017-07-14 DIAGNOSIS — M7989 Other specified soft tissue disorders: Secondary | ICD-10-CM

## 2017-07-14 DIAGNOSIS — M48061 Spinal stenosis, lumbar region without neurogenic claudication: Secondary | ICD-10-CM

## 2017-07-14 DIAGNOSIS — M79605 Pain in left leg: Secondary | ICD-10-CM

## 2017-07-14 NOTE — Progress Notes (Signed)
Left lower extremity venous duplex completed. No evidence of a DVT,superficial thrombosis, or Baker's cyst. Toma Copier, RVS  07/14/2017 1:20 PM

## 2017-07-16 NOTE — Anesthesia Preprocedure Evaluation (Addendum)
Anesthesia Evaluation  Patient identified by MRN, date of birth, ID band Patient awake    Reviewed: Allergy & Precautions, NPO status , Patient's Chart, lab work & pertinent test results  History of Anesthesia Complications (+) PONV  Airway Mallampati: II  TM Distance: >3 FB Neck ROM: Full    Dental  (+) Partial Upper, Partial Lower   Pulmonary former smoker,    breath sounds clear to auscultation       Cardiovascular negative cardio ROS   Rhythm:Regular Rate:Normal     Neuro/Psych negative neurological ROS     GI/Hepatic Neg liver ROS, GERD  ,  Endo/Other  Hypothyroidism   Renal/GU negative Renal ROS     Musculoskeletal  (+) Arthritis ,   Abdominal   Peds  Hematology negative hematology ROS (+)   Anesthesia Other Findings   Reproductive/Obstetrics                             Lab Results  Component Value Date   CREATININE 0.99 06/14/2017   BUN 14 06/14/2017   NA 135 06/14/2017   K 4.1 06/14/2017   CL 104 06/14/2017   CO2 23 06/14/2017    Lab Results  Component Value Date   WBC 8.9 06/14/2017   HGB 8.5 (L) 06/14/2017   HCT 26.2 (L) 06/14/2017   MCV 92.6 06/14/2017   PLT 151 06/14/2017    Anesthesia Physical Anesthesia Plan  ASA: II  Anesthesia Plan: General   Post-op Pain Management:    Induction: Intravenous  PONV Risk Score and Plan: 4 or greater and Treatment may vary due to age or medical condition  Airway Management Planned: Oral ETT  Additional Equipment:   Intra-op Plan:   Post-operative Plan: Extubation in OR  Informed Consent: I have reviewed the patients History and Physical, chart, labs and discussed the procedure including the risks, benefits and alternatives for the proposed anesthesia with the patient or authorized representative who has indicated his/her understanding and acceptance.   Dental advisory given  Plan Discussed with:  CRNA  Anesthesia Plan Comments:         Anesthesia Quick Evaluation

## 2017-07-17 ENCOUNTER — Inpatient Hospital Stay (HOSPITAL_COMMUNITY): Payer: Medicare Other

## 2017-07-17 ENCOUNTER — Encounter (HOSPITAL_COMMUNITY)
Admission: RE | Disposition: A | Discharge status: Home / Self Care | Payer: Self-pay | Source: Ambulatory Visit | Attending: Neurological Surgery

## 2017-07-17 ENCOUNTER — Inpatient Hospital Stay (HOSPITAL_COMMUNITY): Payer: Medicare Other | Admitting: Anesthesiology

## 2017-07-17 ENCOUNTER — Inpatient Hospital Stay (HOSPITAL_COMMUNITY)
Admission: RE | Admit: 2017-07-17 | Discharge: 2017-07-21 | DRG: 460 | Disposition: A | Discharge status: Home / Self Care | Payer: Medicare Other | Source: Ambulatory Visit | Attending: Neurological Surgery | Admitting: Neurological Surgery

## 2017-07-17 ENCOUNTER — Encounter (HOSPITAL_COMMUNITY): Payer: Self-pay | Admitting: *Deleted

## 2017-07-17 ENCOUNTER — Other Ambulatory Visit: Payer: Self-pay

## 2017-07-17 DIAGNOSIS — T84498A Other mechanical complication of other internal orthopedic devices, implants and grafts, initial encounter: Secondary | ICD-10-CM | POA: Diagnosis present

## 2017-07-17 DIAGNOSIS — S32028A Other fracture of second lumbar vertebra, initial encounter for closed fracture: Secondary | ICD-10-CM | POA: Diagnosis present

## 2017-07-17 DIAGNOSIS — M5416 Radiculopathy, lumbar region: Secondary | ICD-10-CM | POA: Diagnosis present

## 2017-07-17 DIAGNOSIS — M79605 Pain in left leg: Secondary | ICD-10-CM | POA: Diagnosis present

## 2017-07-17 DIAGNOSIS — E039 Hypothyroidism, unspecified: Secondary | ICD-10-CM | POA: Diagnosis present

## 2017-07-17 DIAGNOSIS — D62 Acute posthemorrhagic anemia: Secondary | ICD-10-CM | POA: Diagnosis not present

## 2017-07-17 DIAGNOSIS — S32009A Unspecified fracture of unspecified lumbar vertebra, initial encounter for closed fracture: Secondary | ICD-10-CM | POA: Diagnosis present

## 2017-07-17 DIAGNOSIS — Y838 Other surgical procedures as the cause of abnormal reaction of the patient, or of later complication, without mention of misadventure at the time of the procedure: Secondary | ICD-10-CM | POA: Diagnosis present

## 2017-07-17 DIAGNOSIS — Z881 Allergy status to other antibiotic agents status: Secondary | ICD-10-CM | POA: Diagnosis not present

## 2017-07-17 DIAGNOSIS — M7989 Other specified soft tissue disorders: Secondary | ICD-10-CM | POA: Diagnosis present

## 2017-07-17 DIAGNOSIS — Z419 Encounter for procedure for purposes other than remedying health state, unspecified: Secondary | ICD-10-CM

## 2017-07-17 DIAGNOSIS — Z09 Encounter for follow-up examination after completed treatment for conditions other than malignant neoplasm: Secondary | ICD-10-CM

## 2017-07-17 DIAGNOSIS — M549 Dorsalgia, unspecified: Secondary | ICD-10-CM | POA: Diagnosis present

## 2017-07-17 DIAGNOSIS — Z87891 Personal history of nicotine dependence: Secondary | ICD-10-CM | POA: Diagnosis not present

## 2017-07-17 DIAGNOSIS — Z981 Arthrodesis status: Secondary | ICD-10-CM | POA: Diagnosis not present

## 2017-07-17 HISTORY — PX: POSTERIOR LUMBAR FUSION 4 LEVEL: SHX6037

## 2017-07-17 HISTORY — PX: APPLICATION OF ROBOTIC ASSISTANCE FOR SPINAL PROCEDURE: SHX6753

## 2017-07-17 HISTORY — DX: Personal history of other medical treatment: Z92.89

## 2017-07-17 LAB — BASIC METABOLIC PANEL
Anion gap: 13 (ref 5–15)
BUN: 14 mg/dL (ref 6–20)
CO2: 21 mmol/L — ABNORMAL LOW (ref 22–32)
Calcium: 9.8 mg/dL (ref 8.9–10.3)
Chloride: 102 mmol/L (ref 101–111)
Creatinine, Ser: 0.82 mg/dL (ref 0.44–1.00)
GFR calc Af Amer: >60 mL/min (ref >60)
GFR calc non Af Amer: >60 mL/min (ref >60)
Glucose, Bld: 113 mg/dL — ABNORMAL HIGH (ref 65–99)
Potassium: 3.7 mmol/L (ref 3.5–5.1)
Sodium: 136 mmol/L (ref 135–145)

## 2017-07-17 LAB — CBC
HCT: 38.3 % (ref 36.0–46.0)
Hemoglobin: 12.2 g/dL (ref 12.0–15.0)
MCH: 30 pg (ref 26.0–34.0)
MCHC: 31.9 g/dL (ref 30.0–36.0)
MCV: 94.3 fL (ref 78.0–100.0)
Platelets: 363 10*3/uL (ref 150–400)
RBC: 4.06 MIL/uL (ref 3.87–5.11)
RDW: 13.8 % (ref 11.5–15.5)
WBC: 5.4 10*3/uL (ref 4.0–10.5)

## 2017-07-17 LAB — PREPARE RBC (CROSSMATCH)

## 2017-07-17 SURGERY — POSTERIOR LUMBAR FUSION 4 LEVEL
Anesthesia: General | Site: Spine Lumbar

## 2017-07-17 MED ORDER — SODIUM CHLORIDE 0.9 % IV SOLN
INTRAVENOUS | Status: DC | PRN
  Administered 2017-07-17: 500 mL

## 2017-07-17 MED ORDER — VANCOMYCIN HCL IN DEXTROSE 1-5 GM/200ML-% IV SOLN
1000.0000 mg | Freq: Once | INTRAVENOUS | Status: AC
  Administered 2017-07-17: 1000 mg via INTRAVENOUS
  Filled 2017-07-17: qty 200

## 2017-07-17 MED ORDER — ONDANSETRON HCL 4 MG PO TABS
4.0000 mg | ORAL_TABLET | Freq: Four times a day (QID) | ORAL | Status: DC | PRN
  Administered 2017-07-18: 4 mg via ORAL
  Filled 2017-07-17: qty 1

## 2017-07-17 MED ORDER — ACETAMINOPHEN 10 MG/ML IV SOLN: 1000.0000 mg | Freq: Once | INTRAVENOUS | Status: DC | PRN

## 2017-07-17 MED ORDER — LIDOCAINE-EPINEPHRINE 1 %-1:100000 IJ SOLN
INTRAMUSCULAR | Status: AC
  Filled 2017-07-17: qty 1

## 2017-07-17 MED ORDER — POLYETHYL GLYCOL-PROPYL GLYCOL 0.4-0.3 % OP GEL: Freq: Two times a day (BID) | OPHTHALMIC | Status: DC

## 2017-07-17 MED ORDER — ONDANSETRON HCL 4 MG/2ML IJ SOLN
4.0000 mg | Freq: Four times a day (QID) | INTRAMUSCULAR | Status: DC | PRN
  Filled 2017-07-17: qty 2

## 2017-07-17 MED ORDER — DEXAMETHASONE SODIUM PHOSPHATE 10 MG/ML IJ SOLN
INTRAMUSCULAR | Status: DC | PRN
  Administered 2017-07-17: 10 mg via INTRAVENOUS

## 2017-07-17 MED ORDER — PROPOFOL 10 MG/ML IV BOLUS
INTRAVENOUS | Status: DC | PRN
  Administered 2017-07-17: 120 mg via INTRAVENOUS

## 2017-07-17 MED ORDER — SODIUM CHLORIDE 0.9 % IV SOLN
250.0000 mL | INTRAVENOUS | Status: DC
  Administered 2017-07-17: 250 mL via INTRAVENOUS

## 2017-07-17 MED ORDER — LEVOTHYROXINE SODIUM 50 MCG PO TABS
50.0000 ug | ORAL_TABLET | Freq: Every day | ORAL | Status: DC
  Administered 2017-07-18: 50 ug via ORAL
  Filled 2017-07-17: qty 1

## 2017-07-17 MED ORDER — HYDROCODONE-ACETAMINOPHEN 5-325 MG PO TABS
2.0000 | ORAL_TABLET | ORAL | Status: DC | PRN
  Administered 2017-07-17 – 2017-07-21 (×12): 2 via ORAL
  Filled 2017-07-17 (×13): qty 2

## 2017-07-17 MED ORDER — ALBUMIN HUMAN 5 % IV SOLN
INTRAVENOUS | Status: DC | PRN
  Administered 2017-07-17: 11:00:00 via INTRAVENOUS

## 2017-07-17 MED ORDER — MENTHOL 3 MG MT LOZG: 1.0000 | LOZENGE | OROMUCOSAL | Status: DC | PRN

## 2017-07-17 MED ORDER — BUPIVACAINE HCL (PF) 0.5 % IJ SOLN
INTRAMUSCULAR | Status: AC
  Filled 2017-07-17: qty 30

## 2017-07-17 MED ORDER — POLYETHYLENE GLYCOL 3350 17 G PO PACK: 17.0000 g | PACK | Freq: Every day | ORAL | Status: DC | PRN

## 2017-07-17 MED ORDER — THROMBIN 20000 UNITS EX SOLR
CUTANEOUS | Status: AC
  Filled 2017-07-17: qty 20000

## 2017-07-17 MED ORDER — SCOPOLAMINE 1 MG/3DAYS TD PT72
MEDICATED_PATCH | TRANSDERMAL | Status: DC | PRN
  Administered 2017-07-17: 1 via TRANSDERMAL

## 2017-07-17 MED ORDER — ONDANSETRON HCL 4 MG/2ML IJ SOLN
INTRAMUSCULAR | Status: AC
  Filled 2017-07-17: qty 2

## 2017-07-17 MED ORDER — MORPHINE SULFATE (PF) 4 MG/ML IV SOLN
4.0000 mg | INTRAVENOUS | Status: DC | PRN
  Administered 2017-07-18: 4 mg via INTRAVENOUS
  Filled 2017-07-17: qty 1

## 2017-07-17 MED ORDER — THROMBIN 5000 UNITS EX SOLR
CUTANEOUS | Status: AC
  Filled 2017-07-17: qty 5000

## 2017-07-17 MED ORDER — SUGAMMADEX SODIUM 200 MG/2ML IV SOLN
INTRAVENOUS | Status: DC | PRN
  Administered 2017-07-17: 200 mg via INTRAVENOUS

## 2017-07-17 MED ORDER — ARTIFICIAL TEARS OPHTHALMIC OINT
TOPICAL_OINTMENT | OPHTHALMIC | Status: DC | PRN
  Administered 2017-07-17: 1 via OPHTHALMIC

## 2017-07-17 MED ORDER — SODIUM CHLORIDE 0.9% FLUSH: 3.0000 mL | INTRAVENOUS | Status: DC | PRN

## 2017-07-17 MED ORDER — LIDOCAINE-EPINEPHRINE 1 %-1:100000 IJ SOLN
INTRAMUSCULAR | Status: DC | PRN
  Administered 2017-07-17: 5 mL

## 2017-07-17 MED ORDER — CHLORHEXIDINE GLUCONATE CLOTH 2 % EX PADS: 6.0000 | MEDICATED_PAD | Freq: Once | CUTANEOUS | Status: DC

## 2017-07-17 MED ORDER — METHOCARBAMOL 500 MG PO TABS
500.0000 mg | ORAL_TABLET | Freq: Four times a day (QID) | ORAL | Status: DC | PRN
  Administered 2017-07-17: 500 mg via ORAL

## 2017-07-17 MED ORDER — FENTANYL CITRATE (PF) 100 MCG/2ML IJ SOLN: 25.0000 ug | INTRAMUSCULAR | Status: DC | PRN

## 2017-07-17 MED ORDER — FLEET ENEMA 7-19 GM/118ML RE ENEM: 1.0000 | ENEMA | Freq: Once | RECTAL | Status: DC | PRN

## 2017-07-17 MED ORDER — VANCOMYCIN HCL IN DEXTROSE 1-5 GM/200ML-% IV SOLN
1000.0000 mg | INTRAVENOUS | Status: AC
  Administered 2017-07-17: 1000 mg via INTRAVENOUS
  Filled 2017-07-17: qty 200

## 2017-07-17 MED ORDER — FENTANYL CITRATE (PF) 100 MCG/2ML IJ SOLN
INTRAMUSCULAR | Status: AC
  Filled 2017-07-17: qty 2

## 2017-07-17 MED ORDER — EPHEDRINE SULFATE 50 MG/ML IJ SOLN
INTRAMUSCULAR | Status: DC | PRN
  Administered 2017-07-17: 10 mg via INTRAVENOUS

## 2017-07-17 MED ORDER — LACTATED RINGERS IV SOLN
INTRAVENOUS | Status: DC
  Administered 2017-07-17: 18:00:00 via INTRAVENOUS

## 2017-07-17 MED ORDER — METHOCARBAMOL 500 MG PO TABS
500.0000 mg | ORAL_TABLET | Freq: Four times a day (QID) | ORAL | Status: DC | PRN
  Administered 2017-07-18 – 2017-07-19 (×3): 500 mg via ORAL
  Filled 2017-07-17 (×4): qty 1

## 2017-07-17 MED ORDER — DOCUSATE SODIUM 100 MG PO CAPS
100.0000 mg | ORAL_CAPSULE | Freq: Two times a day (BID) | ORAL | Status: DC
  Administered 2017-07-17 – 2017-07-21 (×8): 100 mg via ORAL
  Filled 2017-07-17 (×8): qty 1

## 2017-07-17 MED ORDER — HYPROMELLOSE (GONIOSCOPIC) 2.5 % OP SOLN
1.0000 [drp] | Freq: Two times a day (BID) | OPHTHALMIC | Status: DC
  Administered 2017-07-18 – 2017-07-19 (×2): 1 [drp] via OPHTHALMIC
  Filled 2017-07-17: qty 15

## 2017-07-17 MED ORDER — SCOPOLAMINE 1 MG/3DAYS TD PT72
MEDICATED_PATCH | TRANSDERMAL | Status: AC
  Filled 2017-07-17: qty 1

## 2017-07-17 MED ORDER — LIDOCAINE 2% (20 MG/ML) 5 ML SYRINGE
INTRAMUSCULAR | Status: AC
Start: 2017-07-17 — End: 2017-07-17
  Filled 2017-07-17: qty 5

## 2017-07-17 MED ORDER — 0.9 % SODIUM CHLORIDE (POUR BTL) OPTIME
TOPICAL | Status: DC | PRN
  Administered 2017-07-17: 1000 mL

## 2017-07-17 MED ORDER — SODIUM CHLORIDE 0.9% FLUSH
3.0000 mL | Freq: Two times a day (BID) | INTRAVENOUS | Status: DC
  Administered 2017-07-17 – 2017-07-21 (×8): 3 mL via INTRAVENOUS

## 2017-07-17 MED ORDER — LIDOCAINE HCL (CARDIAC) 20 MG/ML IV SOLN
INTRAVENOUS | Status: DC | PRN
  Administered 2017-07-17: 80 mg via INTRAVENOUS

## 2017-07-17 MED ORDER — LACTATED RINGERS IV SOLN
INTRAVENOUS | Status: DC
  Administered 2017-07-17 (×3): via INTRAVENOUS

## 2017-07-17 MED ORDER — PHENYLEPHRINE 40 MCG/ML (10ML) SYRINGE FOR IV PUSH (FOR BLOOD PRESSURE SUPPORT)
PREFILLED_SYRINGE | INTRAVENOUS | Status: AC
  Filled 2017-07-17: qty 10

## 2017-07-17 MED ORDER — OXYCODONE-ACETAMINOPHEN 5-325 MG PO TABS
1.0000 | ORAL_TABLET | ORAL | Status: DC | PRN
Start: 2017-07-17 — End: 2017-07-21
  Administered 2017-07-17: 2 via ORAL

## 2017-07-17 MED ORDER — ALUM & MAG HYDROXIDE-SIMETH 200-200-20 MG/5ML PO SUSP
30.0000 mL | Freq: Four times a day (QID) | ORAL | Status: DC | PRN
  Administered 2017-07-18: 30 mL via ORAL
  Filled 2017-07-17: qty 30

## 2017-07-17 MED ORDER — ACETAMINOPHEN 650 MG RE SUPP: 650.0000 mg | RECTAL | Status: DC | PRN

## 2017-07-17 MED ORDER — HYDROCODONE-ACETAMINOPHEN 5-325 MG PO TABS: 1.0000 | ORAL_TABLET | ORAL | Status: DC | PRN

## 2017-07-17 MED ORDER — PANTOPRAZOLE SODIUM 40 MG PO TBEC
40.0000 mg | DELAYED_RELEASE_TABLET | Freq: Every day | ORAL | Status: DC
  Administered 2017-07-18 – 2017-07-21 (×4): 40 mg via ORAL
  Filled 2017-07-17 (×4): qty 1

## 2017-07-17 MED ORDER — DEXAMETHASONE SODIUM PHOSPHATE 10 MG/ML IJ SOLN
INTRAMUSCULAR | Status: AC
  Filled 2017-07-17: qty 1

## 2017-07-17 MED ORDER — PHENYLEPHRINE HCL 10 MG/ML IJ SOLN
INTRAVENOUS | Status: DC | PRN
  Administered 2017-07-17: 25 ug/min via INTRAVENOUS

## 2017-07-17 MED ORDER — THROMBIN (RECOMBINANT) 20000 UNITS EX SOLR
CUTANEOUS | Status: DC | PRN
  Administered 2017-07-17: 20 mL via TOPICAL

## 2017-07-17 MED ORDER — ROCURONIUM BROMIDE 10 MG/ML (PF) SYRINGE
PREFILLED_SYRINGE | INTRAVENOUS | Status: AC
  Filled 2017-07-17: qty 10

## 2017-07-17 MED ORDER — SODIUM CHLORIDE 0.9 % IV SOLN: Freq: Once | INTRAVENOUS | Status: DC

## 2017-07-17 MED ORDER — THROMBIN (RECOMBINANT) 5000 UNITS EX SOLR
OROMUCOSAL | Status: DC | PRN
  Administered 2017-07-17: 5 mL via TOPICAL

## 2017-07-17 MED ORDER — FENTANYL CITRATE (PF) 250 MCG/5ML IJ SOLN
INTRAMUSCULAR | Status: AC
Start: 2017-07-17 — End: 2017-07-17
  Filled 2017-07-17: qty 5

## 2017-07-17 MED ORDER — PROPOFOL 10 MG/ML IV BOLUS
INTRAVENOUS | Status: AC
  Filled 2017-07-17: qty 20

## 2017-07-17 MED ORDER — ACETAMINOPHEN 10 MG/ML IV SOLN
INTRAVENOUS | Status: DC | PRN
  Administered 2017-07-17: 1000 mg via INTRAVENOUS

## 2017-07-17 MED ORDER — BUPIVACAINE HCL (PF) 0.5 % IJ SOLN
INTRAMUSCULAR | Status: DC | PRN
  Administered 2017-07-17: 5 mL

## 2017-07-17 MED ORDER — ROCURONIUM BROMIDE 100 MG/10ML IV SOLN
INTRAVENOUS | Status: DC | PRN
  Administered 2017-07-17: 40 mg via INTRAVENOUS
  Administered 2017-07-17: 30 mg via INTRAVENOUS
  Administered 2017-07-17: 20 mg via INTRAVENOUS
  Administered 2017-07-17: 50 mg via INTRAVENOUS

## 2017-07-17 MED ORDER — FENTANYL CITRATE (PF) 100 MCG/2ML IJ SOLN
INTRAMUSCULAR | Status: DC | PRN
  Administered 2017-07-17: 25 ug via INTRAVENOUS
  Administered 2017-07-17 (×4): 50 ug via INTRAVENOUS
  Administered 2017-07-17: 25 ug via INTRAVENOUS
  Administered 2017-07-17 (×2): 50 ug via INTRAVENOUS

## 2017-07-17 MED ORDER — ACETAMINOPHEN 10 MG/ML IV SOLN
INTRAVENOUS | Status: AC
  Filled 2017-07-17: qty 100

## 2017-07-17 MED ORDER — FENTANYL CITRATE (PF) 250 MCG/5ML IJ SOLN
INTRAMUSCULAR | Status: AC
  Filled 2017-07-17: qty 5

## 2017-07-17 MED ORDER — METHOCARBAMOL 1000 MG/10ML IJ SOLN
500.0000 mg | Freq: Four times a day (QID) | INTRAMUSCULAR | Status: DC | PRN
  Filled 2017-07-17: qty 5

## 2017-07-17 MED ORDER — BISACODYL 10 MG RE SUPP
10.0000 mg | Freq: Every day | RECTAL | Status: DC | PRN
  Administered 2017-07-17: 10 mg via RECTAL
  Filled 2017-07-17: qty 1

## 2017-07-17 MED ORDER — PHENOL 1.4 % MT LIQD: 1.0000 | OROMUCOSAL | Status: DC | PRN

## 2017-07-17 MED ORDER — PHENYLEPHRINE HCL 10 MG/ML IJ SOLN
INTRAMUSCULAR | Status: DC | PRN
  Administered 2017-07-17: 80 ug via INTRAVENOUS

## 2017-07-17 MED ORDER — ONDANSETRON HCL 4 MG/2ML IJ SOLN
4.0000 mg | Freq: Once | INTRAMUSCULAR | Status: AC | PRN
  Administered 2017-07-17: 4 mg via INTRAVENOUS

## 2017-07-17 MED ORDER — BUMETANIDE 0.5 MG PO TABS
0.5000 mg | ORAL_TABLET | Freq: Every day | ORAL | Status: DC | PRN
  Filled 2017-07-17: qty 1

## 2017-07-17 MED ORDER — ACETAMINOPHEN 325 MG PO TABS
650.0000 mg | ORAL_TABLET | ORAL | Status: DC | PRN
  Filled 2017-07-17: qty 2

## 2017-07-17 MED ORDER — ONDANSETRON HCL 4 MG/2ML IJ SOLN
INTRAMUSCULAR | Status: DC | PRN
  Administered 2017-07-17: 4 mg via INTRAVENOUS

## 2017-07-17 MED ORDER — SENNA 8.6 MG PO TABS
1.0000 | ORAL_TABLET | Freq: Two times a day (BID) | ORAL | Status: DC
  Administered 2017-07-17 – 2017-07-21 (×8): 8.6 mg via ORAL
  Filled 2017-07-17 (×8): qty 1

## 2017-07-17 SURGICAL SUPPLY — 89 items
ADAPTER DRIVER T25 (MISCELLANEOUS) ×16 IMPLANT
BAG DECANTER FOR FLEXI CONT (MISCELLANEOUS) ×3 IMPLANT
BASKET BONE COLLECTION (BASKET) IMPLANT
BIT DRILL LONG 3.0X30 (BIT) IMPLANT
BIT DRILL LONG 3X80 (BIT) IMPLANT
BIT DRILL LONG 4X80 (BIT) IMPLANT
BIT DRILL SHORT 3.0X30 (BIT) IMPLANT
BIT DRILL SHORT 3X80 (BIT) IMPLANT
BLADE CLIPPER SURG (BLADE) IMPLANT
BLADE SURG 11 STRL SS (BLADE) ×3 IMPLANT
BUR MATCHSTICK NEURO 3.0 LAGG (BURR) ×3 IMPLANT
CANISTER SUCT 3000ML PPV (MISCELLANEOUS) ×3 IMPLANT
CEMENT BONE KYPHX HV R (Orthopedic Implant) ×3 IMPLANT
CONT SPEC 4OZ CLIKSEAL STRL BL (MISCELLANEOUS) ×3 IMPLANT
COVER BACK TABLE 60X90IN (DRAPES) ×3 IMPLANT
DECANTER SPIKE VIAL GLASS SM (MISCELLANEOUS) ×3 IMPLANT
DERMABOND ADVANCED (GAUZE/BANDAGES/DRESSINGS) ×2
DERMABOND ADVANCED .7 DNX12 (GAUZE/BANDAGES/DRESSINGS) ×4 IMPLANT
DEVICE BONE FILLER KYPHON SZ3 (MISCELLANEOUS) ×6 IMPLANT
DEVICE DISSECT PLASMABLAD 3.0S (MISCELLANEOUS) ×2 IMPLANT
DRAPE C-ARM 42X72 X-RAY (DRAPES) ×9 IMPLANT
DRAPE HALF SHEET 40X57 (DRAPES) IMPLANT
DRAPE LAPAROTOMY 100X72X124 (DRAPES) ×3 IMPLANT
DRAPE SHEET LG 3/4 BI-LAMINATE (DRAPES) ×3 IMPLANT
DRIVER ADAPTER T25 (MISCELLANEOUS) ×24
DRSG OPSITE POSTOP 4X10 (GAUZE/BANDAGES/DRESSINGS) ×3 IMPLANT
DURAPREP 26ML APPLICATOR (WOUND CARE) ×3 IMPLANT
DURASEAL APPLICATOR TIP (TIP) IMPLANT
DURASEAL SPINE SEALANT 3ML (MISCELLANEOUS) IMPLANT
ELECT BLADE 4.0 EZ CLEAN MEGAD (MISCELLANEOUS) ×3
ELECT REM PT RETURN 9FT ADLT (ELECTROSURGICAL) ×3
ELECTRODE BLDE 4.0 EZ CLN MEGD (MISCELLANEOUS) ×2 IMPLANT
ELECTRODE REM PT RTRN 9FT ADLT (ELECTROSURGICAL) ×2 IMPLANT
GAUZE SPONGE 4X4 12PLY STRL (GAUZE/BANDAGES/DRESSINGS) IMPLANT
GAUZE SPONGE 4X4 16PLY XRAY LF (GAUZE/BANDAGES/DRESSINGS) ×3 IMPLANT
GLOVE BIO SURGEON STRL SZ7 (GLOVE) ×3 IMPLANT
GLOVE BIOGEL PI IND STRL 7.0 (GLOVE) ×4 IMPLANT
GLOVE BIOGEL PI IND STRL 7.5 (GLOVE) ×2 IMPLANT
GLOVE BIOGEL PI IND STRL 8 (GLOVE) ×8 IMPLANT
GLOVE BIOGEL PI IND STRL 8.5 (GLOVE) ×4 IMPLANT
GLOVE BIOGEL PI INDICATOR 7.0 (GLOVE) ×2
GLOVE BIOGEL PI INDICATOR 7.5 (GLOVE) ×1
GLOVE BIOGEL PI INDICATOR 8 (GLOVE) ×4
GLOVE BIOGEL PI INDICATOR 8.5 (GLOVE) ×2
GLOVE ECLIPSE 7.5 STRL STRAW (GLOVE) ×9 IMPLANT
GLOVE ECLIPSE 8.5 STRL (GLOVE) ×6 IMPLANT
GLOVE ECLIPSE 9.0 STRL (GLOVE) ×3 IMPLANT
GOWN STRL REUS W/ TWL LRG LVL3 (GOWN DISPOSABLE) IMPLANT
GOWN STRL REUS W/ TWL XL LVL3 (GOWN DISPOSABLE) ×4 IMPLANT
GOWN STRL REUS W/TWL 2XL LVL3 (GOWN DISPOSABLE) ×12 IMPLANT
GOWN STRL REUS W/TWL LRG LVL3 (GOWN DISPOSABLE)
GOWN STRL REUS W/TWL XL LVL3 (GOWN DISPOSABLE) ×2
GUIDEWIRE 18IN BLUNT CD HORIZ (WIRE) ×24 IMPLANT
HEMOSTAT POWDER KIT SURGIFOAM (HEMOSTASIS) IMPLANT
KIT BASIN OR (CUSTOM PROCEDURE TRAY) ×3 IMPLANT
KIT INFUSE MEDIUM (Orthopedic Implant) ×3 IMPLANT
KIT SPINE MAZOR X ROBO DISP (MISCELLANEOUS) ×6 IMPLANT
KIT TURNOVER KIT B (KITS) ×3 IMPLANT
KYPHON BFD (MISCELLANEOUS) ×9
MILL MEDIUM DISP (BLADE) IMPLANT
NEEDLE HYPO 22GX1.5 SAFETY (NEEDLE) ×3 IMPLANT
NS IRRIG 1000ML POUR BTL (IV SOLUTION) ×3 IMPLANT
PACK LAMINECTOMY NEURO (CUSTOM PROCEDURE TRAY) ×3 IMPLANT
PAD ARMBOARD 7.5X6 YLW CONV (MISCELLANEOUS) ×9 IMPLANT
PATTIES SURGICAL .5 X1 (DISPOSABLE) ×3 IMPLANT
PIN HEAD 2.5X60MM (PIN) IMPLANT
PLASMABLADE 3.0S (MISCELLANEOUS) ×3
ROD 5.5MM SPINAL SOLERA (Rod) ×3 IMPLANT
SCREW LOCK RELINE 5.5 TULIP (Screw) ×18 IMPLANT
SCREW SCHANZ SA 4.0MM (MISCELLANEOUS) IMPLANT
SCREW SET SOLERA (Screw) ×8 IMPLANT
SCREW SET SOLERA TI5.5 (Screw) ×16 IMPLANT
SCREW SOLERA 4.5X40 (Screw) ×12 IMPLANT
SCREW SOLERA 4.5X45 (Screw) ×6 IMPLANT
SCREW SOLERA 45X5.5XMA (Screw) ×4 IMPLANT
SCREW SOLERA 5.5X45MM (Screw) ×2 IMPLANT
SPONGE LAP 4X18 X RAY DECT (DISPOSABLE) IMPLANT
SPONGE SURGIFOAM ABS GEL 100 (HEMOSTASIS) ×3 IMPLANT
SUT PROLENE 6 0 BV (SUTURE) ×3 IMPLANT
SUT VIC AB 1 CT1 18XBRD ANBCTR (SUTURE) ×4 IMPLANT
SUT VIC AB 1 CT1 8-18 (SUTURE) ×2
SUT VIC AB 2-0 CP2 18 (SUTURE) ×6 IMPLANT
SUT VIC AB 3-0 SH 8-18 (SUTURE) ×9 IMPLANT
SYR 3ML LL SCALE MARK (SYRINGE) ×12 IMPLANT
TOWEL GREEN STERILE (TOWEL DISPOSABLE) ×3 IMPLANT
TOWEL GREEN STERILE FF (TOWEL DISPOSABLE) ×3 IMPLANT
TRAY FOLEY W/METER SILVER 16FR (SET/KITS/TRAYS/PACK) ×3 IMPLANT
TUBE MAZOR SA REDUCTION (TUBING) ×3 IMPLANT
WATER STERILE IRR 1000ML POUR (IV SOLUTION) ×3 IMPLANT

## 2017-07-17 NOTE — Progress Notes (Signed)
Patient ID: [name], female   DOB: 1932-08-13, 84 y.o.   MRN: [id number] Vital signs good Motor function intact Doing well postop

## 2017-07-17 NOTE — Progress Notes (Signed)
Awakened to share with her spouse has gone home, she acknowledges "he's tired"

## 2017-07-17 NOTE — Transfer of Care (Signed)
Immediate Anesthesia Transfer of Care Note  Patient: [name]  Procedure(s) Performed: Thoracic Ten - Lumbar Five revison of hardware with Mazor (N/A Spine Lumbar) APPLICATION OF ROBOTIC ASSISTANCE FOR SPINAL PROCEDURE (N/A )  Patient Location: PACU  Anesthesia Type:General  Level of Consciousness: awake and drowsy  Airway & Oxygen Therapy: Patient Spontanous Breathing and Patient connected to nasal cannula oxygen  Post-op Assessment: Report given to RN, Post -op Vital signs reviewed and stable and Patient moving all extremities X 4  Post vital signs: Reviewed and stable  Last Vitals:  Vitals Value Taken Time  BP 119/73 07/17/2017  1:34 PM  Temp 36.9 C 07/17/2017  1:34 PM  Pulse 103 07/17/2017  1:37 PM  Resp 17 07/17/2017  1:37 PM  SpO2 97 % 07/17/2017  1:37 PM  Vitals shown include unvalidated device data.  Last Pain:  Vitals:   07/17/17 1334  TempSrc:   PainSc: Asleep      Patients Stated Pain Goal: 3 (59/93/57 0177)  Complications: No apparent anesthesia complications

## 2017-07-17 NOTE — H&P (Signed)
[name] is an 84 y.o. female.   Chief Complaint: Severe back pain post op HPI: Patient had surgery on March 12 to decompress and fuse L2 to sacrum. Post op was getting better but then developed worsening back pain over past 2 weeks.xray was ok but CT shows fracture of L2 with severe retropulsion of bone and deformity above this. She is now readmitted to undergo restabilization with extension of fixation into thoracic spine.  Past Medical History:  Diagnosis Date  . Arthritis   . GERD (gastroesophageal reflux disease)   . History of blood transfusion   . Hypothyroidism   . PONV (postoperative nausea and vomiting)   . Ptosis of both eyelids   . Vertigo   . Wears glasses    reading    Past Surgical History:  Procedure Laterality Date  . ABDOMINAL HYSTERECTOMY  1986  . [place]  2000   right  . CARPAL TUNNEL RELEASE  2012   left  . COLONOSCOPY    . CYSTOCELE REPAIR  2007   sling  . DISTAL INTERPHALANGEAL JOINT FUSION Right 02/04/2014   Procedure: FUSION DISTAL INTERPHALANGEAL JOINT RIGHT INDEX FINGER ;  Surgeon: Daryll Brod, MD;  Location: [place];  Service: Orthopedics;  Laterality: Right;  . EYE SURGERY Bilateral    Cataract surgery with lens implant  . KNEE SURGERY  2009,2011   partial knee  . OPEN REDUCTION INTERNAL FIXATION (ORIF) PROXIMAL PHALANX Left 08/30/2013   Procedure: OPEN REDUCTION INTERNAL FIXATION (ORIF) PROXIMAL PHALANX FRACTURE LEFT SMALL FINGER; SPLINT RING FINGER;  Surgeon: Wynonia Sours, MD;  Location: [place];  Service: Orthopedics;  Laterality: Left;  . PTOSIS REPAIR Bilateral 07/27/2015   Procedure: PTOSIS REPAIR;  Surgeon: Cristine Polio, MD;  Location: [place];  Service: Plastics;  Laterality: Bilateral;  . SHOULDER SURGERY     rt rcr,and lt  . TONSILLECTOMY      History reviewed. No pertinent family history. Social History:  reports that she  has quit smoking. She has never used smokeless tobacco. She reports that she does not drink alcohol or use drugs.  Allergies:  Allergies  Allergen Reactions  . Keflex [Cephalexin] Hives    No medications prior to admission.    No results found for this or any previous visit (from the past 48 hour(s)). No results found.  Review of Systems  Constitutional: Negative.   HENT: Negative.   Respiratory: Negative.   Musculoskeletal: Positive for back pain.  Skin: Negative.   Neurological: Positive for tingling and focal weakness.  Endo/Heme/Allergies: Negative.   Psychiatric/Behavioral: Negative.     There were no vitals taken for this visit. Physical Exam  Constitutional: She is oriented to person, place, and time. She appears well-developed and well-nourished.  HENT:  Head: Normocephalic and atraumatic.  Eyes: Pupils are equal, round, and reactive to light. Conjunctivae and EOM are normal.  Neck: Normal range of motion. Neck supple.  Cardiovascular: Normal rate and regular rhythm.  Respiratory: Effort normal and breath sounds normal.  GI: Soft. Bowel sounds are normal.  Musculoskeletal:  Centralized back pain to palpation and percussion  Neurological: She is alert and oriented to person, place, and time. She has normal reflexes.  Skin: Skin is warm and dry.  Psychiatric: She has a normal mood and affect. Her behavior is normal. Judgment and thought content normal.     Assessment/Plan L2 fracture dislocation s/p  L2 to sacrum fixation Revision of fixation above L2 fracture.  Earleen Newport, MD 07/17/2017, 5:35 AM

## 2017-07-17 NOTE — Op Note (Signed)
Date of surgery: 07/17/2017 Preoperative diagnosis: Unstable lumbar fracture of L2, status post decompression fusion from L2-L5 on 06/13/2017. Postoperative diagnosis: Same Procedure: Removal of posterior fixation and pedicle screws in L2.  Placement of pedicular fixation from T10-L1 using robotic guidance augmentation of pedicle screws at T10-T11-T12 and L1 with methylmethacrylate reconfiguration of stabilization from T10-L5 with posterior rods posterior arthrodesis with autograft and infuse from T10-L2 Surgeon: Kristeen Miss First assistant: Deri Fuelling Anesthesia: General endotracheal Indications: [name] is an 84 year old individual who on 12 March underwent decompression and fixation from L2-L5 with pedicular fixation and interbody grafting at [place].  She initially seemed to be tolerating the surgery but soon developed severe back pain was found to have a fracture of the superior edge of L2 with retrolisthesis of L1 on L2.  This was confirmed by recent CT scan.  She was advised regarding the need for revision of her fixation and fusion to include up to the level of T10 for added stability.  Procedure: Patient was brought to the operating room supine on a stretcher.  After the smooth induction of general endotracheal anesthesia she was turned prone onto the [place] table.  The patient had had a robotic appropriate CT scan of the thoracic spine performed and after surgical planning was done on the CT scan the plan was confirmed and verified.  The patient's back was then prepped with alcohol DuraPrep and draped in a sterile fashion.  The robotic arm was attached to the operating table.  The back was then opened with a linear incision opening the superior portion of the previously made incision and then extending this incision all the way up to T10.  Dissection was carried down to the lumbodorsal fascia and the thoracodorsal fascia was opened all the way up to T10 this area was packed off.  The  previously placed hardware was then identified and the screw caps were removed first on the left than on the right.  The rods were removed.  Screw on the left side it L2 was noted to be severely loose and this was removed without unscrewing it.  On the right side the screw was removed.  Plain radiographs were obtained to see if there is any reduction with loosening of the hardware.  None was observed.  Then by isolating the pedicle entry sites at L1 T12 T11 and T10 the robotic arm was attached to the patient with a clamp being placed on the spinous process of T12.  Pedicle entry sites were then chosen robotically and K wires were placed into the pedicles at T10-T11-T12 and L1.  In T10 and T11 4.5 x 40 mm screws were placed.  And T12 a 5.5 x 45 mm screw was placed and L1 5.5 x 45 mm screw was placed.  This was done bilaterally.  Once the hardware was in place using fluoroscopic guidance we placed 1-1/2 cc of methacrylate into each vertebral body at T10-T11-T12 and L1 first on the right side than on the left.  The glue was allowed to set and then rods were contoured to fit between the sagittals from L5-T10.  The posterior laminar arches and spinous processes were decorticated from T10 down to L2.  The intertransverse space between L1 and L2 was decorticated also.  Autograft from the decortication was plaqued into the lateral gutters and then strips of infuse were placed.  The rods were contoured to a neutral construct and these were then tightened loosely at first.  Final radiographs were obtained  which showed good decompression and realignment of the thoracic and lumbar spine across the L1-L2 level.  With this final tightening was performed and hemostasis was checked in the soft tissues.  A singular dural tear occurred down at L5 and this was closed with a singular 6-0 Prolene suture.  No further leakage was observed.  With this the lumbodorsal fascia was reapproximated with #1 Vicryl interrupted fashion 2-0 Vicryl  was used in the subtenons tissues and 3-0 Vicryl subcuticularly.  Dermabond was used on the skin.  Blood loss was 550 cc, and 130 cc of Cell Saver blood was returned to the patient.

## 2017-07-17 NOTE — Anesthesia Procedure Notes (Signed)
Procedure Name: Intubation Date/Time: 07/17/2017 8:40 AM Performed by: Inda Coke, CRNA Pre-anesthesia Checklist: Patient identified, Emergency Drugs available, Suction available and Patient being monitored Patient Re-evaluated:Patient Re-evaluated prior to induction Oxygen Delivery Method: Circle System Utilized Preoxygenation: Pre-oxygenation with 100% oxygen Induction Type: IV induction Ventilation: Mask ventilation without difficulty Laryngoscope Size: Mac and 3 Grade View: Grade I Tube type: Oral Tube size: 7.5 mm Number of attempts: 1 Airway Equipment and Method: Stylet and Oral airway Placement Confirmation: ETT inserted through vocal cords under direct vision,  positive ETCO2 and breath sounds checked- equal and bilateral Secured at: 22 cm Tube secured with: Tape Dental Injury: Teeth and Oropharynx as per pre-operative assessment

## 2017-07-17 NOTE — Social Work (Signed)
CSW acknowledging SNF consult, pt will need PT/OT notes for insurance authorization if recommended. Continuing to follow.   Alexander Mt, [place] Work [phone]

## 2017-07-18 ENCOUNTER — Encounter (HOSPITAL_COMMUNITY): Payer: Self-pay | Admitting: Neurological Surgery

## 2017-07-18 LAB — BASIC METABOLIC PANEL
Anion gap: 7 (ref 5–15)
BUN: 14 mg/dL (ref 6–20)
CO2: 25 mmol/L (ref 22–32)
Calcium: 8.9 mg/dL (ref 8.9–10.3)
Chloride: 103 mmol/L (ref 101–111)
Creatinine, Ser: 0.76 mg/dL (ref 0.44–1.00)
GFR calc Af Amer: >60 mL/min (ref >60)
GFR calc non Af Amer: >60 mL/min (ref >60)
Glucose, Bld: 140 mg/dL — ABNORMAL HIGH (ref 65–99)
Potassium: 4.4 mmol/L (ref 3.5–5.1)
Sodium: 135 mmol/L (ref 135–145)

## 2017-07-18 LAB — CBC
HCT: 27.1 % — ABNORMAL LOW (ref 36.0–46.0)
Hemoglobin: 8.9 g/dL — ABNORMAL LOW (ref 12.0–15.0)
MCH: 30.7 pg (ref 26.0–34.0)
MCHC: 32.8 g/dL (ref 30.0–36.0)
MCV: 93.4 fL (ref 78.0–100.0)
Platelets: 274 10*3/uL (ref 150–400)
RBC: 2.9 MIL/uL — ABNORMAL LOW (ref 3.87–5.11)
RDW: 13.8 % (ref 11.5–15.5)
WBC: 8.4 10*3/uL (ref 4.0–10.5)

## 2017-07-18 MED FILL — Sodium Chloride IV Soln 0.9%: INTRAVENOUS | Qty: 2000 | Status: AC

## 2017-07-18 MED FILL — Heparin Sodium (Porcine) Inj 1000 Unit/ML: INTRAMUSCULAR | Qty: 30 | Status: AC

## 2017-07-18 MED FILL — Thrombin For Soln 5000 Unit: CUTANEOUS | Qty: 5000 | Status: AC

## 2017-07-18 MED FILL — Thrombin For Soln 20000 Unit: CUTANEOUS | Qty: 1 | Status: AC

## 2017-07-18 NOTE — Care Management Note (Signed)
Case Management Note  Patient Details  Name: ANGI GOODELL MRN: [id number] Date of Birth: 1932/10/15  Subjective/Objective:  84 y.o. female who underwent L2-S1 on 3/12, was readmitted due to fracture of L2 with retropulsion of bone.  She restabilization with PLIF T-10-L2.  PTA, pt required assistance with ADLS; lives with spouse.                     Action/Plan: PT recommending HH follow up.  Will follow progress.   Expected Discharge Date:                  Expected Discharge Plan:  [place]  In-House Referral:     Discharge planning Services  CM Consult  Post Acute Care Choice:    Choice offered to:     DME Arranged:    DME Agency:     HH Arranged:    [place] Agency:     Status of Service:  In process, will continue to follow  If discussed at Long Length of Stay Meetings, dates discussed:    Additional Comments:  Ella Bodo, RN 07/18/2017, 5:01 PM

## 2017-07-18 NOTE — Evaluation (Signed)
Occupational Therapy Evaluation Patient Details Name: [name] MRN: [id number] DOB: 1932/04/17 Today's Date: 07/18/2017    History of Present Illness This 84 y.o. female who underwent L2-S1 on 3/12, was readmitted due to fracture of L2 with retropulsion of bone.  She restabilization with PLIF T-10-L2.   PMH includes:  Vertigo   Clinical Impression   Pt admitted with above. She demonstrates the below listed deficits and will benefit from continued OT to maximize safety and independence with BADLs.  Pt presents to OT with increased pain, and decreased activity tolerance.   Pt currently requires min - max A for ADLs, and min - min guard assist for functional transfers and functional mobility.  She lives with spouse, and has all needed DME and AE.  She was mod I with ambulation, and required assist for LB ADLs PTA.   Anticipate good progress.  Will follow acutely.       Follow Up Recommendations  No OT follow up;Supervision/Assistance - 24 hour    Equipment Recommendations  None recommended by OT    Recommendations for Other Services       Precautions / Restrictions Precautions Precautions: Back;Fall Precaution Booklet Issued: No Precaution Comments: Pt able to recall 3/3 precautions with min cues  Required Braces or Orthoses: Spinal Brace Spinal Brace: Applied in sitting position;Lumbar corset      Mobility Bed Mobility Overal bed mobility: Needs Assistance Bed Mobility: Rolling;Sidelying to Sit;Sit to Sidelying Rolling: Min guard Sidelying to sit: Min guard     Sit to sidelying: Min guard General bed mobility comments: verbal cues for technique.  Dependent on use of rails   Transfers Overall transfer level: Needs assistance Equipment used: Rolling walker (2 wheeled) Transfers: Sit to/from [facility] Sit to Stand: Min guard;Min assist Stand pivot transfers: Min guard       General transfer comment: Verbal cues for hand placement.  Assist to  move into standing from commode      Balance Overall balance assessment: Needs assistance Sitting-balance support: Bilateral upper extremity supported Sitting balance-Leahy Scale: Fair     Standing balance support: Bilateral upper extremity supported Standing balance-Leahy Scale: Poor                             ADL either performed or assessed with clinical judgement   ADL Overall ADL's : Needs assistance/impaired Eating/Feeding: Independent;Bed level   Grooming: Wash/dry hands;Min guard;Standing   Upper Body Bathing: Sitting;Set up   Lower Body Bathing: Moderate assistance;Sit to/from stand   Upper Body Dressing : Minimal assistance;Sitting   Lower Body Dressing: Maximal assistance;Sit to/from stand   Toilet Transfer: Minimal assistance;Ambulation;Comfort height toilet;RW;Grab bars Toilet Transfer Details (indicate cue type and reason): verbal cues for hand placement  Toileting- Clothing Manipulation and Hygiene: Moderate assistance;Sit to/from stand Toileting - Clothing Manipulation Details (indicate cue type and reason): assist for peri care.  Has toilet aid      Functional mobility during ADLs: Min guard;Rolling walker General ADL Comments: Pt limited to BR only due to brace not present - spouse bringing it in soon, per her report      Vision         Perception     Praxis      Pertinent Vitals/Pain Pain Assessment: Faces Pain Score: 10-Worst pain ever Pain Location: low back Pain Descriptors / Indicators: Operative site guarding Pain Intervention(s): Monitored during session;Premedicated before session;Repositioned     Hand Dominance Right  Extremity/Trunk Assessment Upper Extremity Assessment Upper Extremity Assessment: Overall WFL for tasks assessed   Lower Extremity Assessment Lower Extremity Assessment: Defer to PT evaluation   Cervical / Trunk Assessment Cervical / Trunk Assessment: Other exceptions Cervical / Trunk Exceptions:  s/p spinal sx   Communication Communication Communication: No difficulties   Cognition Arousal/Alertness: Awake/alert Behavior During Therapy: WFL for tasks assessed/performed;Anxious Overall Cognitive Status: Within Functional Limits for tasks assessed                                     General Comments       Exercises     Shoulder Instructions      Home Living Family/patient expects to be discharged to:: Private residence Living Arrangements: Spouse/significant other Available Help at Discharge: Family Type of Home: House Home Access: Stairs to enter Technical brewer of Steps: 2 Entrance Stairs-Rails: Right Home Layout: Multi-level Alternate Level Stairs-Number of Steps: 4 Alternate Level Stairs-Rails: Right Bathroom Shower/Tub: Walk-in shower;Tub/shower unit   Bathroom Toilet: Standard     Home Equipment: Cane - single point;Tub bench;Walker - 2 wheels;Bedside commode;Adaptive equipment Adaptive Equipment: Reacher;Sock aid;Long-handled shoe horn;Long-handled sponge        Prior Functioning/Environment Level of Independence: Needs assistance  Gait / Transfers Assistance Needed: Pt reports she was ambulating with RW ADL's / Homemaking Assistance Needed: She reports spouse was assisting her with LB ADLs due to pain             OT Problem List: Decreased activity tolerance;Decreased knowledge of use of DME or AE;Decreased knowledge of precautions;Pain      OT Treatment/Interventions: Self-care/ADL training;DME and/or AE instruction;Therapeutic activities;Patient/family education;Balance training    OT Goals(Current goals can be found in the care plan section) Acute Rehab OT Goals Patient Stated Goal: To get back to normal  OT Goal Formulation: With patient Time For Goal Achievement: 08/01/17 Potential to Achieve Goals: Good ADL Goals Pt Will Perform Grooming: with supervision;standing Pt Will Perform Upper Body Bathing: with  supervision;with set-up;sitting Pt Will Perform Lower Body Bathing: with supervision;with adaptive equipment;sit to/from stand Pt Will Perform Upper Body Dressing: with supervision;with set-up;sitting Pt Will Perform Lower Body Dressing: with supervision;sit to/from stand;with adaptive equipment Pt Will Transfer to Toilet: with supervision;ambulating;regular height toilet;bedside commode;grab bars Pt Will Perform Toileting - Clothing Manipulation and hygiene: with supervision;sit to/from stand Pt Will Perform Tub/Shower Transfer: Shower transfer;with supervision;ambulating;shower seat;3 in 1;rolling walker Additional ADL Goal #1: Pt will independently don brace  OT Frequency: Min 2X/week   Barriers to D/C:            Co-evaluation              AM-PAC PT "6 Clicks" Daily Activity     Outcome Measure Help from another person eating meals?: None Help from another person taking care of personal grooming?: A Little Help from another person toileting, which includes using toliet, bedpan, or urinal?: A Lot Help from another person bathing (including washing, rinsing, drying)?: A Lot Help from another person to put on and taking off regular upper body clothing?: A Little Help from another person to put on and taking off regular lower body clothing?: A Lot 6 Click Score: 16   End of Session Equipment Utilized During Treatment: Rolling walker;Gait belt Nurse Communication: Mobility status  Activity Tolerance: Patient tolerated treatment well Patient left: in bed;with call bell/phone within reach;with bed alarm set  OT Visit Diagnosis:  Pain Pain - part of body: (back )                Time: 8251-8984 OT Time Calculation (min): 29 min Charges:  OT General Charges $OT Visit: 1 Visit OT Evaluation $OT Eval Moderate Complexity: 1 Mod OT Treatments $Self Care/Home Management : 8-22 mins G-Codes:     [facility], OTR/L [phone]   Lucille Passy M 07/18/2017, 9:58 AM

## 2017-07-18 NOTE — Progress Notes (Signed)
Orthopedic Tech Progress Note Patient Details:  [name] 12-07-1932 [id number]  Patient ID: [name], female   DOB: [birth year], 84 y.o.   MRN: [id number]   [name] 07/18/2017, 9:18 AMCalled [employer] for Lumbar brace.

## 2017-07-18 NOTE — Progress Notes (Signed)
Patient ID: [name], female   DOB: Mar 01, 1933, 84 y.o.   MRN: [id number] Vital signs good  Post op hgb is 8.9 consistent with acute blood loss anemia Labs ok otherwise Legs feel better Back quite sore Will mobilize with PT

## 2017-07-18 NOTE — Evaluation (Signed)
Physical Therapy Evaluation Patient Details Name: [name] MRN: [id number] DOB: Apr 24, 1932 Today's Date: 07/18/2017   History of Present Illness  This 84 y.o. female who underwent L2-S1 on 3/12, was readmitted due to fracture of L2 with retropulsion of bone.  She restabilization with PLIF T-10-L2.   PMH includes:  Vertigo  Clinical Impression  Pt admitted with above diagnosis. Pt currently with functional limitations due to the deficits listed below (see PT Problem List). Pt encouraged that she does not have pain down LLE today and did not experience L knee buckling with ambulation. Ambulated 250' with RW and min-guard A. Having most pain with bed mobility.  Pt will benefit from skilled PT to increase their independence and safety with mobility to allow discharge to the venue listed below.       Follow Up Recommendations Home health PT;Supervision - Intermittent    Equipment Recommendations  None recommended by PT    Recommendations for Other Services       Precautions / Restrictions Precautions Precautions: Back;Fall Precaution Booklet Issued: No Precaution Comments: Pt able to recall 3/3 precautions with min cues  Required Braces or Orthoses: Spinal Brace Spinal Brace: Applied in sitting position;Lumbar corset Restrictions Weight Bearing Restrictions: No      Mobility  Bed Mobility Overal bed mobility: Needs Assistance Bed Mobility: Rolling;Sidelying to Sit Rolling: Min guard Sidelying to sit: Min guard       General bed mobility comments: increased time needed, vc's for not holding breath, increased pain with SL to sit  Transfers Overall transfer level: Needs assistance Equipment used: Rolling walker (2 wheeled) Transfers: Sit to/from Stand Sit to Stand: Min guard         General transfer comment: vc's for hand placement and for bracing with abdominal muscles and pushing through floor with feet. was unable to rise before cues, able to rise with  min-guard afer cues  Ambulation/Gait Ambulation/Gait assistance: Supervision Ambulation Distance (Feet): 250 Feet Assistive device: Rolling walker (2 wheeled) Gait Pattern/deviations: Step-through pattern Gait velocity: decreased Gait velocity interpretation: 1.31 - 2.62 ft/sec, indicative of limited community ambulator General Gait Details: pt encouraged that she did not have L knee instability with ambulation, still mild favoring of LLE though  Stairs            Wheelchair Mobility    Modified Rankin (Stroke Patients Only)       Balance Overall balance assessment: Needs assistance Sitting-balance support: No upper extremity supported;Feet supported Sitting balance-Leahy Scale: Fair     Standing balance support: Bilateral upper extremity supported Standing balance-Leahy Scale: Poor Standing balance comment: reliant on UE support                             Pertinent Vitals/Pain Pain Assessment: Faces Faces Pain Scale: Hurts even more Pain Location: low back Pain Descriptors / Indicators: Operative site guarding Pain Intervention(s): Limited activity within patient's tolerance;Monitored during session    [place] expects to be discharged to:: Private residence Living Arrangements: Spouse/significant other Available Help at Discharge: Family Type of Home: House Home Access: Stairs to enter Entrance Stairs-Rails: Right Entrance Stairs-Number of Steps: 2 Home Layout: Multi-level Home Equipment: Cane - single point;Tub bench;Walker - 2 wheels;Bedside commode;Adaptive equipment      Prior Function Level of Independence: Needs assistance   Gait / Transfers Assistance Needed: pt has been able to ambulate only very short distances since last surgery due to persistent pain L  hip down leg  ADL's / Homemaking Assistance Needed: She reports spouse was assisting her with LB ADLs due to pain         Hand Dominance   Dominant Hand:  Right    Extremity/Trunk Assessment   Upper Extremity Assessment Upper Extremity Assessment: Defer to OT evaluation    Lower Extremity Assessment Lower Extremity Assessment: LLE deficits/detail LLE Deficits / Details: Still has numbness L foot and lower leg but not having knee instability that she had before surgery. LLE generally 3/5 throughout LLE Sensation: decreased light touch LLE Coordination: decreased gross motor    Cervical / Trunk Assessment Cervical / Trunk Assessment: Other exceptions Cervical / Trunk Exceptions: s/p spinal sx  Communication   Communication: No difficulties  Cognition Arousal/Alertness: Awake/alert Behavior During Therapy: WFL for tasks assessed/performed Overall Cognitive Status: Within Functional Limits for tasks assessed                                        General Comments General comments (skin integrity, edema, etc.): pt left in chair, instructed to remaon only 20 mins first time up and RN notified    Exercises     Assessment/Plan    PT Assessment Patient needs continued PT services  PT Problem List Decreased strength;Decreased balance;Decreased mobility;Decreased knowledge of use of DME;Pain       PT Treatment Interventions DME instruction;Gait training;Stair training;Functional mobility training;Therapeutic activities;Therapeutic exercise;Balance training;Neuromuscular re-education;Patient/family education;Manual techniques;Modalities    PT Goals (Current goals can be found in the Care Plan section)  Acute Rehab PT Goals Patient Stated Goal: To get back to normal  PT Goal Formulation: With patient Time For Goal Achievement: 08/01/17 Potential to Achieve Goals: Good    Frequency Min 5X/week   Barriers to discharge        Co-evaluation               AM-PAC PT "6 Clicks" Daily Activity  Outcome Measure Difficulty turning over in bed (including adjusting bedclothes, sheets and blankets)?: A  Little Difficulty moving from lying on back to sitting on the side of the bed? : A Little Difficulty sitting down on and standing up from a chair with arms (e.g., wheelchair, bedside commode, etc,.)?: A Little Help needed moving to and from a bed to chair (including a wheelchair)?: A Little Help needed walking in hospital room?: A Little Help needed climbing 3-5 steps with a railing? : A Little 6 Click Score: 18    End of Session Equipment Utilized During Treatment: Back brace Activity Tolerance: Patient tolerated treatment well Patient left: in chair;with call bell/phone within reach;with family/visitor present Nurse Communication: Mobility status PT Visit Diagnosis: Pain;Other abnormalities of gait and mobility (R26.89) Pain - part of body: (back)    Time: 3149-7026 PT Time Calculation (min) (ACUTE ONLY): 31 min   Charges:   PT Evaluation $PT Eval Moderate Complexity: 1 Mod PT Treatments $Gait Training: 8-22 mins   PT G Codes:        Quentin  Rives 07/18/2017, 1:29 PM

## 2017-07-18 NOTE — Anesthesia Postprocedure Evaluation (Signed)
Anesthesia Post Note  Patient: IJANAE MACAPAGAL  Procedure(s) Performed: Thoracic Ten - Lumbar Five revison of hardware with Mazor (N/A Spine Lumbar) APPLICATION OF ROBOTIC ASSISTANCE FOR SPINAL PROCEDURE (N/A )     Patient location during evaluation: PACU Anesthesia Type: General Level of consciousness: awake and alert Pain management: pain level controlled Vital Signs Assessment: post-procedure vital signs reviewed and stable Respiratory status: spontaneous breathing, nonlabored ventilation, respiratory function stable and patient connected to nasal cannula oxygen Cardiovascular status: blood pressure returned to baseline and stable Postop Assessment: no apparent nausea or vomiting Anesthetic complications: no    Last Vitals:  Vitals:   07/18/17 0300 07/18/17 0800  BP: 113/62   Pulse: 71   Resp:    Temp: 36.6 C 36.7 C  SpO2: 98%     Last Pain:  Vitals:   07/18/17 0800  TempSrc: Oral  PainSc: 8                  Barnet [place]

## 2017-07-19 ENCOUNTER — Inpatient Hospital Stay: Admission: RE | Admit: 2017-07-19 | Payer: Medicare Other | Source: Ambulatory Visit

## 2017-07-19 MED ORDER — LEVOTHYROXINE SODIUM 50 MCG PO TABS
50.0000 ug | ORAL_TABLET | Freq: Every day | ORAL | Status: DC
  Administered 2017-07-19 – 2017-07-21 (×3): 50 ug via ORAL
  Filled 2017-07-19 (×3): qty 1

## 2017-07-19 MED ORDER — MAGNESIUM HYDROXIDE 400 MG/5ML PO SUSP
30.0000 mL | Freq: Every day | ORAL | Status: DC | PRN
  Administered 2017-07-20: 30 mL via ORAL
  Filled 2017-07-19: qty 30

## 2017-07-19 NOTE — Progress Notes (Signed)
Physical Therapy Treatment Patient Details Name: [name] MRN: [id number] DOB: 03/17/1933 Today's Date: 07/19/2017    History of Present Illness This 84 y.o. female who underwent L2-S1 on 3/12, was readmitted due to fracture of L2 with retropulsion of bone.  She restabilization with PLIF T-10-L5.   PMH includes:  Vertigo    PT Comments    Pt is progressing well with gait and mobility with increased confidence and less assist overall today.  She still feels she needs RW for support during gait.  Back precaution education continued.  Pt will need to practice steps prior to d/c home.     Follow Up Recommendations  Home health PT;Supervision - Intermittent     Equipment Recommendations  None recommended by PT    Recommendations for Other Services   NA     Precautions / Restrictions Precautions Precautions: Back;Fall Required Braces or Orthoses: Spinal Brace Spinal Brace: Applied in sitting position;Lumbar corset    Mobility  Bed Mobility Overal bed mobility: Needs Assistance Bed Mobility: Sit to Sidelying         Sit to sidelying: Min assist General bed mobility comments: Min assist to help lift legs into bed.   Transfers Overall transfer level: Needs assistance Equipment used: Rolling walker (2 wheeled) Transfers: Sit to/from Stand Sit to Stand: Min guard         General transfer comment: Min guard assist for safety from elevated bed.  Verbal cues for safe hand placement.   Ambulation/Gait Ambulation/Gait assistance: Supervision Ambulation Distance (Feet): 150 Feet Assistive device: Rolling walker (2 wheeled) Gait Pattern/deviations: Step-through pattern Gait velocity: decreased   General Gait Details: pt continues to report L LE weakness, no obvious asymmetry, bukling or foot drag on this side, but pt is relying on RW use for gait.           Balance Overall balance assessment: Needs assistance Sitting-balance support: Feet supported Sitting  balance-Leahy Scale: Fair     Standing balance support: Bilateral upper extremity supported Standing balance-Leahy Scale: Poor                              Cognition Arousal/Alertness: Awake/alert Behavior During Therapy: WFL for tasks assessed/performed Overall Cognitive Status: Within Functional Limits for tasks assessed                                               Pertinent Vitals/Pain Pain Assessment: 0-10 Pain Score: 4  Pain Location: low back Pain Descriptors / Indicators: Operative site guarding Pain Intervention(s): Limited activity within patient's tolerance;Monitored during session;Repositioned       Prior Function            PT Goals (current goals can now be found in the care plan section) Acute Rehab PT Goals Patient Stated Goal: To get back to normal  Progress towards PT goals: Progressing toward goals    Frequency    Min 5X/week      PT Plan Current plan remains appropriate       AM-PAC PT "6 Clicks" Daily Activity  Outcome Measure  Difficulty turning over in bed (including adjusting bedclothes, sheets and blankets)?: A Little Difficulty moving from lying on back to sitting on the side of the bed? : A Little Difficulty sitting down on and standing up from a chair  with arms (e.g., wheelchair, bedside commode, etc,.)?: Unable Help needed moving to and from a bed to chair (including a wheelchair)?: A Little Help needed walking in hospital room?: A Little Help needed climbing 3-5 steps with a railing? : A Little 6 Click Score: 16    End of Session Equipment Utilized During Treatment: Gait belt;Back brace Activity Tolerance: Patient limited by pain Patient left: in bed;with call bell/phone within reach;with family/visitor present   PT Visit Diagnosis: Pain;Other abnormalities of gait and mobility (R26.89) Pain - Right/Left: Left Pain - part of body: Leg     Time: 5035-4656 PT Time Calculation (min) (ACUTE  ONLY): 21 min  Charges:  $Gait Training: 8-22 mins                    Binyomin Brann B. [place], [place], DPT [phone]   07/19/2017, 10:22 PM

## 2017-07-19 NOTE — Care Management (Signed)
Patient has hx of unstable lumbar fracture of L2 s/p decompression fusion from L2-L5; pt s/p restabilization with PLIF T-10-L2.  She requires spine to be positioned in ways not feasible with a normal bed.  Head must be elevated at least 30 degrees or more.  Pt requires frequent and immediate changes in body position which cannot be achieved with a normal bed.    Reinaldo Raddle, RN, BSN  Trauma/Neuro ICU Case Manager [phone]

## 2017-07-19 NOTE — Social Work (Signed)
CSW acknowledging consult for SNF.  CSW aware that pt will discharge home with home health services and equipment.   CSW signing off. Please consult if any additional needs arise.  [place], [place] Work [phone]

## 2017-07-20 ENCOUNTER — Inpatient Hospital Stay (HOSPITAL_COMMUNITY): Payer: Medicare Other

## 2017-07-20 MED ORDER — HYDROCODONE-ACETAMINOPHEN 5-325 MG PO TABS: 1.0000 | ORAL_TABLET | ORAL | 0 refills | Status: DC | PRN

## 2017-07-20 MED ORDER — METHOCARBAMOL 500 MG PO TABS: 500.0000 mg | ORAL_TABLET | Freq: Four times a day (QID) | ORAL | 5 refills | Status: DC | PRN

## 2017-07-20 NOTE — Progress Notes (Signed)
Physical Therapy Treatment Patient Details Name: [name] MRN: [id number] DOB: [birth year] Today's Date: 07/20/2017    History of Present Illness This 84 y.o. female who underwent L2-S1 on 3/12, was readmitted due to fracture of L2 with retropulsion of bone.  She restabilization with PLIF T-10-L5.   PMH includes:  Vertigo    PT Comments    Patient self limited this session (did not want to practice stairs) due to pain in L foot earlier and fearful of return.  Continue to feel she will need assist at home for mobility and likely in addition to [place] would benefit from bathing aide.  PT to follow up prior to d/c.   Follow Up Recommendations  Home health PT;Supervision - Intermittent(HH aide)     Equipment Recommendations  None recommended by PT    Recommendations for Other Services       Precautions / Restrictions Precautions Precautions: Back;Fall Required Braces or Orthoses: Spinal Brace Spinal Brace: Applied in sitting position;Lumbar corset    Mobility  Bed Mobility Overal bed mobility: Needs Assistance   Rolling: Supervision Sidelying to sit: Supervision       General bed mobility comments: increased time and use of rail, but able to perform without physical help, did have spasm in L foot upon performing side to sit  Transfers   Equipment used: Rolling walker (2 wheeled) Transfers: Sit to/from Stand Sit to Stand: Min guard         General transfer comment: from edge of bed and toilet with cues for hand placement  Ambulation/Gait Ambulation/Gait assistance: Supervision Ambulation Distance (Feet): 220 Feet Assistive device: Rolling walker (2 wheeled) Gait Pattern/deviations: Step-through pattern;Decreased stride length     General Gait Details: no LOB increased time on turning, did not want to attempt steps this session   Stairs             Wheelchair Mobility    Modified Rankin (Stroke Patients Only)       Balance Overall balance  assessment: Needs assistance Sitting-balance support: Feet supported Sitting balance-Leahy Scale: Good     Standing balance support: Bilateral upper extremity supported Standing balance-Leahy Scale: Poor Standing balance comment: assist for hygiene in standing more due to back precautions                            Cognition Arousal/Alertness: Awake/alert Behavior During Therapy: WFL for tasks assessed/performed Overall Cognitive Status: Within Functional Limits for tasks assessed                                        Exercises      General Comments General comments (skin integrity, edema, etc.): spouse in the room in chair and with back issues himself; heated up trays for pt and spouse      Pertinent Vitals/Pain Faces Pain Scale: Hurts whole lot Pain Location: L foot/anterior shin when spasms Pain Descriptors / Indicators: Aching;Guarding Pain Intervention(s): Monitored during session;Repositioned    Home Living                      Prior Function            PT Goals (current goals can now be found in the care plan section) Progress towards PT goals: Progressing toward goals    Frequency    Min 5X/week  PT Plan Current plan remains appropriate    Co-evaluation              AM-PAC PT "6 Clicks" Daily Activity  Outcome Measure  Difficulty turning over in bed (including adjusting bedclothes, sheets and blankets)?: A Little Difficulty moving from lying on back to sitting on the side of the bed? : A Lot Difficulty sitting down on and standing up from a chair with arms (e.g., wheelchair, bedside commode, etc,.)?: Unable Help needed moving to and from a bed to chair (including a wheelchair)?: A Little Help needed walking in hospital room?: A Little Help needed climbing 3-5 steps with a railing? : A Little 6 Click Score: 15    End of Session Equipment Utilized During Treatment: Back brace Activity Tolerance:  Patient tolerated treatment well Patient left: in bed;with call bell/phone within reach(sitting EOB to eat)   PT Visit Diagnosis: Pain;Other abnormalities of gait and mobility (R26.89) Pain - Right/Left: Left Pain - part of body: Leg     Time: 1540-1605 PT Time Calculation (min) (ACUTE ONLY): 25 min  Charges:  $Gait Training: 8-22 mins $Therapeutic Activity: 8-22 mins                    G CodesMagda Rowe, [place] [phone] 07/20/2017    [name] 07/20/2017, 4:57 PM

## 2017-07-20 NOTE — Discharge Summary (Signed)
Physician Discharge Summary  Patient ID: [name] MRN: [id number] DOB/AGE: 82/02/1933 82 y.o.  Admit date: 07/17/2017 Discharge date: 07/21/2017  Admission Diagnoses: L2 fracture, status post L2-L5 decompression and fusion with lumbar radiculopathy.  Discharge Diagnoses: L2 fracture, status post L2-L5 decompression and fusion.  Acute blood loss anemia.  Lumbar radiculopathy Active Problems:   Lumbar vertebral fracture [facility])   Discharged Condition: good  Hospital Course: Patient was admitted to undergo revision surgery having had a surgery to decompress and stabilize from L2-L5 on 06/13/2017.  Postoperatively the patient noted that she developed significant radicular pain and increasing back pain was found to have an L2 fracture with retropulsion of the superior endplate of L1.  This aggravated substantial radicular pain.  She required revision fixation to include T10 down to the L5 construct.  Tolerated this well.  She had acute blood loss anemia with a greater than 3 g loss in hemoglobin but she did not require transfusion.  Consults: None  Significant Diagnostic Studies: None  Treatments: surgery: Revision of fusion from T10-L5.  Discharge Exam: Blood pressure 120/81, pulse 80, temperature 98.5 F (36.9 C), temperature source Oral, resp. rate 18, height 5\' 6"  (1.676 m), weight 78 kg (171 lb 15.3 oz), SpO2 96 %. Incision is clean and dry motor function is intact.  Station and gait are intact  Disposition: Discharge disposition: 01-Home or Self Care       Discharge Instructions    Call MD for:  redness, tenderness, or signs of infection (pain, swelling, redness, odor or green/yellow discharge around incision site)   Complete by:  As directed    Call MD for:  severe uncontrolled pain   Complete by:  As directed    Call MD for:  temperature >100.4   Complete by:  As directed    Diet - low sodium heart healthy   Complete by:  As directed    Discharge instructions    Complete by:  As directed    Okay to shower. Do not apply salves or appointments to incision. No heavy lifting with the upper extremities greater than 15 pounds. May resume driving when not requiring pain medication and patient feels comfortable with doing so.   Incentive spirometry RT   Complete by:  As directed    Increase activity slowly   Complete by:  As directed         Signed: Earleen Newport 07/20/2017, 1:15 PM

## 2017-07-20 NOTE — Progress Notes (Signed)
Patient ID: [name], female   DOB: [birth year], 84 y.o.   MRN: [id number] Vital signs are stable Patient appears to be doing quite well Patient will be ready for discharge tomorrow Electric hospital bed being acquired today

## 2017-07-20 NOTE — Care Management Note (Signed)
Case Management Note  Patient Details  Name: [name] MRN: [id number] Date of Birth: 1933-03-28  Subjective/Objective:  84 y.o. female who underwent L2-S1 on 3/12, was readmitted due to fracture of L2 with retropulsion of bone.  She restabilization with PLIF T-10-L2.  PTA, pt required assistance with ADLS; lives with spouse.                     Action/Plan: PT recommending HH follow up.  Will follow progress.   Expected Discharge Date:  07/21/17               Expected Discharge Plan:  [place]  In-House Referral:     Discharge planning Services  CM Consult  Post Acute Care Choice:    Choice offered to:  Patient  DME Arranged:  Hospital bed DME Agency:  [place]:  PT, OT [facility] Agency:     Status of Service:  Completed, signed off  If discussed at [place] of Stay Meetings, dates discussed:    Additional Comments:  07/20/17 J. Montrail Mehrer, RN, BSN Pt medically stable for discharge home today with spouse.  [facility] to follow at home for PT/OT.   Hospital bed being delivered to pt's home today.     Ella Bodo, RN 07/20/2017, 3:25 PM

## 2017-07-21 LAB — TYPE AND SCREEN
ABO/RH(D): A POS
Antibody Screen: NEGATIVE
Unit division: 0
Unit division: 0

## 2017-07-21 LAB — BPAM RBC
Blood Product Expiration Date: 201905112359
Blood Product Expiration Date: 201905112359
Unit Type and Rh: 6200
Unit Type and Rh: 6200

## 2017-07-21 NOTE — Progress Notes (Signed)
Occupational Therapy Treatment Patient Details Name: [name] MRN: [id number] DOB: 04/13/1932 Today's Date: 07/21/2017    History of present illness This 84 y.o. female who underwent L2-S1 on 3/12, was readmitted due to fracture of L2 with retropulsion of bone.  She restabilization with PLIF T-10-L5.   PMH includes:  Vertigo   OT comments  Pt making good progress with functional goals and may d/c home today pending MD approval. OT will continue to follow acutely until d/c confirmed  Follow Up Recommendations  No OT follow up;Supervision/Assistance - 24 hour    Equipment Recommendations  None recommended by OT    Recommendations for Other Services      Precautions / Restrictions Precautions Precautions: Back;Fall Precaution Booklet Issued: No Precaution Comments: Pt able to recall 3/3 precautions with min cues  Required Braces or Orthoses: Spinal Brace Spinal Brace: Applied in sitting position;Lumbar corset Restrictions Weight Bearing Restrictions: No       Mobility Bed Mobility Overal bed mobility: Needs Assistance Bed Mobility: Rolling;Sidelying to Sit;Sit to Sidelying Rolling: Modified independent (Device/Increase time) Sidelying to sit: Modified independent (Device/Increase time)     Sit to sidelying: Min assist General bed mobility comments: Only assist needed in bed mobility was to lift legs into bed when going to side lying.  Otherwise, pt does well getting to EOB.   Transfers Overall transfer level: Needs assistance Equipment used: Rolling walker (2 wheeled) Transfers: Sit to/from Stand Sit to Stand: Min guard;From elevated surface Stand pivot transfers: Min guard       General transfer comment: Min guard assist with multiple attempts from regular height bed, bed raised.    Balance Overall balance assessment: Needs assistance Sitting-balance support: Feet supported;Bilateral upper extremity supported Sitting balance-Leahy Scale: Good      Standing balance support: Bilateral upper extremity supported;During functional activity Standing balance-Leahy Scale: Fair                             ADL either performed or assessed with clinical judgement   ADL Overall ADL's : Needs assistance/impaired                                             Vision Patient Visual Report: No change from baseline     Perception     Praxis      Cognition Arousal/Alertness: Awake/alert Behavior During Therapy: WFL for tasks assessed/performed Overall Cognitive Status: Within Functional Limits for tasks assessed                                          Exercises     Shoulder Instructions       General Comments      Pertinent Vitals/ Pain       Pain Assessment: 0-10 Pain Score: 4  Faces Pain Scale: Hurts little more Pain Location: Left buttocks, back Pain Descriptors / Indicators: Aching;Guarding Pain Intervention(s): Limited activity within patient's tolerance;Monitored during session;Repositioned  Home Living                                          Prior Functioning/Environment  Frequency  Min 2X/week        Progress Toward Goals  OT Goals(current goals can now be found in the care plan section)  Progress towards OT goals: Progressing toward goals  Acute Rehab OT Goals Patient Stated Goal: To get back to normal  OT Goal Formulation: With patient  Plan Discharge plan remains appropriate    Co-evaluation                 AM-PAC PT "6 Clicks" Daily Activity     Outcome Measure   Help from another person eating meals?: None Help from another person taking care of personal grooming?: A Little Help from another person toileting, which includes using toliet, bedpan, or urinal?: A Little Help from another person bathing (including washing, rinsing, drying)?: A Lot Help from another person to put on and taking off regular  upper body clothing?: A Little Help from another person to put on and taking off regular lower body clothing?: A Lot 6 Click Score: 17    End of Session Equipment Utilized During Treatment: Rolling walker;Gait belt;Back brace;Other (comment)(3 in 1)  OT Visit Diagnosis: Pain   Activity Tolerance Patient tolerated treatment well   Patient Left in bed;with call bell/phone within reach;with bed alarm set   Nurse Communication      Functional Assessment Tool Used: AM-PAC 6 Clicks Daily Activity   Time: 1025-1109 OT Time Calculation (min): 44 min  Charges: OT G-codes **NOT FOR INPATIENT CLASS** Functional Assessment Tool Used: AM-PAC 6 Clicks Daily Activity OT General Charges $OT Visit: 1 Visit OT Treatments $Self Care/Home Management : 8-22 mins $Therapeutic Activity: 23-37 mins     Britt Bottom 07/21/2017, 2:13 PM

## 2017-07-21 NOTE — Progress Notes (Signed)
Physical Therapy Treatment Patient Details Name: [name] MRN: [id number] DOB: January 22, 1933 Today's Date: 07/21/2017    History of Present Illness This 84 y.o. female who underwent L2-S1 on 3/12, was readmitted due to fracture of L2 with retropulsion of bone.  She restabilization with PLIF T-10-L5.   PMH includes:  Vertigo    PT Comments    Pt was agreeable for stair training simulating home entry as she is due to d/c today.  She did not c/o foot pain this session.  She was able to report 3/3 precautions and donn her ASPEN brace with supervision (cues for straps when taking her brace off).  Pt is safe from a mobility standpoint to d/c home.  PT to follow acutely until d/c confirmed.      Follow Up Recommendations  Home health PT;Supervision - Intermittent     Equipment Recommendations  None recommended by PT    Recommendations for Other Services   NA     Precautions / Restrictions Precautions Precautions: Back;Fall Precaution Comments: Pt able to recall 3/3 precautions with min cues  Required Braces or Orthoses: Spinal Brace Spinal Brace: Applied in sitting position;Lumbar corset    Mobility  Bed Mobility Overal bed mobility: Needs Assistance Bed Mobility: Rolling;Sidelying to Sit;Sit to Sidelying Rolling: Modified independent (Device/Increase time) Sidelying to sit: Modified independent (Device/Increase time)     Sit to sidelying: Min assist General bed mobility comments: Only assist needed in bed mobility was to lift legs into bed when going to side lying.  Otherwise, pt does well getting to EOB.   Transfers Overall transfer level: Needs assistance Equipment used: Rolling walker (2 wheeled) Transfers: Sit to/from Stand Sit to Stand: Min guard;From elevated surface         General transfer comment: Min guard assist with multiple attempts from regular height bed, bed raised.  Ambulation/Gait Ambulation/Gait assistance: Supervision Ambulation Distance  (Feet): 220 Feet Assistive device: Rolling walker (2 wheeled) Gait Pattern/deviations: Step-through pattern;Trunk flexed     General Gait Details: Verbal cues for closer proximity to RW and upright posture.  Supervision for safety   Stairs Stairs: Yes Stairs assistance: Min assist;Supervision Stair Management: One rail Right;Forwards;Sideways;Step to pattern Number of Stairs: 4 General stair comments: Up forward with min hand held assist, down sideways with supervision.            Balance Overall balance assessment: Needs assistance Sitting-balance support: Feet supported;Bilateral upper extremity supported Sitting balance-Leahy Scale: Good     Standing balance support: Bilateral upper extremity supported Standing balance-Leahy Scale: Fair                              Cognition Arousal/Alertness: Awake/alert Behavior During Therapy: WFL for tasks assessed/performed Overall Cognitive Status: Within Functional Limits for tasks assessed                                               Pertinent Vitals/Pain Pain Assessment: Faces Faces Pain Scale: Hurts little more Pain Location: Left buttocks Pain Descriptors / Indicators: Aching;Guarding Pain Intervention(s): Limited activity within patient's tolerance;Monitored during session;Repositioned           PT Goals (current goals can now be found in the care plan section) Acute Rehab PT Goals Patient Stated Goal: To get back to normal  Progress towards PT goals:  Progressing toward goals    Frequency    Min 5X/week      PT Plan Current plan remains appropriate       AM-PAC PT "6 Clicks" Daily Activity  Outcome Measure  Difficulty turning over in bed (including adjusting bedclothes, sheets and blankets)?: A Little Difficulty moving from lying on back to sitting on the side of the bed? : A Lot Difficulty sitting down on and standing up from a chair with arms (e.g., wheelchair,  bedside commode, etc,.)?: Unable Help needed moving to and from a bed to chair (including a wheelchair)?: A Little Help needed walking in hospital room?: A Little Help needed climbing 3-5 steps with a railing? : A Little 6 Click Score: 15    End of Session Equipment Utilized During Treatment: Gait belt;Back brace Activity Tolerance: Patient limited by pain Patient left: in bed;with call bell/phone within reach;with family/visitor present   PT Visit Diagnosis: Pain;Other abnormalities of gait and mobility (R26.89) Pain - Right/Left: Left Pain - part of body: Leg     Time: 1205-1220 PT Time Calculation (min) (ACUTE ONLY): 15 min  Charges:  $Gait Training: 8-22 mins          Jacqulene Huntley B. [place], [place], DPT [phone]            07/21/2017, 12:27 PM

## 2017-07-21 NOTE — Care Management Important Message (Signed)
Important Message  Patient Details  Name: [name] MRN: [id number] Date of Birth: [birth year]   Medicare Important Message Given:  Yes    Orbie Pyo 07/21/2017, 2:53 PM

## 2017-08-03 ENCOUNTER — Ambulatory Visit (HOSPITAL_COMMUNITY)
Admission: RE | Admit: 2017-08-03 | Discharge: 2017-08-03 | Disposition: A | Discharge status: Home / Self Care | Payer: Medicare Other | Source: Ambulatory Visit | Attending: Neurological Surgery | Admitting: Neurological Surgery

## 2017-08-03 ENCOUNTER — Other Ambulatory Visit (HOSPITAL_COMMUNITY): Payer: Self-pay | Admitting: Neurological Surgery

## 2017-08-03 DIAGNOSIS — R609 Edema, unspecified: Secondary | ICD-10-CM

## 2017-08-03 DIAGNOSIS — I82491 Acute embolism and thrombosis of other specified deep vein of right lower extremity: Secondary | ICD-10-CM | POA: Diagnosis not present

## 2017-08-03 DIAGNOSIS — M79604 Pain in right leg: Secondary | ICD-10-CM | POA: Insufficient documentation

## 2017-08-03 NOTE — Progress Notes (Addendum)
Preliminary results by tech- Venous Duplex Lower Ext. Completed. Right leg, positive for acute deep vein thrombosis involving the peroneal veins. All other veins are patent without evidence of thrombus. Left leg, negative for deep or superficial vein thrombosis. Results given to Dr. Clarice Pole nurse.  Oda Cogan, BS, RDMS, RVT

## 2017-08-04 ENCOUNTER — Other Ambulatory Visit (HOSPITAL_COMMUNITY): Payer: Self-pay | Admitting: Neurological Surgery

## 2017-08-04 DIAGNOSIS — M48061 Spinal stenosis, lumbar region without neurogenic claudication: Secondary | ICD-10-CM

## 2017-08-04 DIAGNOSIS — M79605 Pain in left leg: Secondary | ICD-10-CM

## 2017-08-08 ENCOUNTER — Ambulatory Visit (HOSPITAL_COMMUNITY)
Admission: RE | Admit: 2017-08-08 | Discharge: 2017-08-08 | Disposition: A | Discharge status: Home / Self Care | Payer: Medicare Other | Source: Ambulatory Visit | Attending: Neurological Surgery | Admitting: Neurological Surgery

## 2017-08-08 ENCOUNTER — Encounter (HOSPITAL_COMMUNITY): Payer: Self-pay

## 2017-08-08 DIAGNOSIS — M48061 Spinal stenosis, lumbar region without neurogenic claudication: Secondary | ICD-10-CM

## 2017-08-08 DIAGNOSIS — M79605 Pain in left leg: Secondary | ICD-10-CM

## 2017-08-14 ENCOUNTER — Ambulatory Visit (HOSPITAL_COMMUNITY)
Admission: RE | Admit: 2017-08-14 | Discharge: 2017-08-14 | Disposition: A | Discharge status: Home / Self Care | Payer: Medicare Other | Source: Ambulatory Visit | Attending: Neurological Surgery | Admitting: Neurological Surgery

## 2017-08-14 DIAGNOSIS — M48061 Spinal stenosis, lumbar region without neurogenic claudication: Secondary | ICD-10-CM | POA: Insufficient documentation

## 2017-08-14 DIAGNOSIS — X58XXXA Exposure to other specified factors, initial encounter: Secondary | ICD-10-CM | POA: Insufficient documentation

## 2017-08-14 DIAGNOSIS — S32059A Unspecified fracture of fifth lumbar vertebra, initial encounter for closed fracture: Secondary | ICD-10-CM | POA: Diagnosis not present

## 2017-08-14 DIAGNOSIS — Z981 Arthrodesis status: Secondary | ICD-10-CM | POA: Diagnosis not present

## 2017-08-14 DIAGNOSIS — M79605 Pain in left leg: Secondary | ICD-10-CM | POA: Insufficient documentation

## 2017-08-25 ENCOUNTER — Other Ambulatory Visit: Payer: Self-pay | Admitting: Neurological Surgery

## 2017-08-25 ENCOUNTER — Other Ambulatory Visit (HOSPITAL_COMMUNITY): Payer: Self-pay | Admitting: Neurological Surgery

## 2017-08-25 DIAGNOSIS — S32059A Unspecified fracture of fifth lumbar vertebra, initial encounter for closed fracture: Secondary | ICD-10-CM

## 2017-08-25 DIAGNOSIS — S32058K Other fracture of fifth lumbar vertebra, subsequent encounter for fracture with nonunion: Secondary | ICD-10-CM

## 2017-08-30 DIAGNOSIS — S32059A Unspecified fracture of fifth lumbar vertebra, initial encounter for closed fracture: Secondary | ICD-10-CM | POA: Insufficient documentation

## 2017-08-30 MED ORDER — SODIUM CHLORIDE 0.9 % IV SOLN: 4.0000 mg | Freq: Four times a day (QID) | INTRAVENOUS | Status: DC | PRN

## 2017-08-31 ENCOUNTER — Other Ambulatory Visit: Payer: Self-pay

## 2017-08-31 ENCOUNTER — Inpatient Hospital Stay (HOSPITAL_COMMUNITY)
Admission: AD | Admit: 2017-08-31 | Discharge: 2017-09-05 | DRG: 460 | Disposition: A | Discharge status: Skilled Nursing Facility | Payer: Medicare Other | Source: Ambulatory Visit | Attending: Neurological Surgery | Admitting: Neurological Surgery

## 2017-08-31 ENCOUNTER — Ambulatory Visit (HOSPITAL_COMMUNITY)
Admission: RE | Admit: 2017-08-31 | Discharge: 2017-08-31 | Disposition: A | Discharge status: Home / Self Care | Payer: Medicare Other | Source: Ambulatory Visit | Attending: Neurological Surgery | Admitting: Neurological Surgery

## 2017-08-31 DIAGNOSIS — Z86718 Personal history of other venous thrombosis and embolism: Secondary | ICD-10-CM

## 2017-08-31 DIAGNOSIS — I82491 Acute embolism and thrombosis of other specified deep vein of right lower extremity: Secondary | ICD-10-CM | POA: Diagnosis present

## 2017-08-31 DIAGNOSIS — Z888 Allergy status to other drugs, medicaments and biological substances status: Secondary | ICD-10-CM | POA: Diagnosis not present

## 2017-08-31 DIAGNOSIS — E039 Hypothyroidism, unspecified: Secondary | ICD-10-CM | POA: Diagnosis present

## 2017-08-31 DIAGNOSIS — D62 Acute posthemorrhagic anemia: Secondary | ICD-10-CM | POA: Diagnosis not present

## 2017-08-31 DIAGNOSIS — M5030 Other cervical disc degeneration, unspecified cervical region: Secondary | ICD-10-CM | POA: Diagnosis present

## 2017-08-31 DIAGNOSIS — R739 Hyperglycemia, unspecified: Secondary | ICD-10-CM | POA: Diagnosis present

## 2017-08-31 DIAGNOSIS — R42 Dizziness and giddiness: Secondary | ICD-10-CM | POA: Diagnosis present

## 2017-08-31 DIAGNOSIS — T84216A Breakdown (mechanical) of internal fixation device of vertebrae, initial encounter: Secondary | ICD-10-CM | POA: Diagnosis present

## 2017-08-31 DIAGNOSIS — S32059K Unspecified fracture of fifth lumbar vertebra, subsequent encounter for fracture with nonunion: Secondary | ICD-10-CM | POA: Diagnosis not present

## 2017-08-31 DIAGNOSIS — I10 Essential (primary) hypertension: Secondary | ICD-10-CM | POA: Diagnosis not present

## 2017-08-31 DIAGNOSIS — K219 Gastro-esophageal reflux disease without esophagitis: Secondary | ICD-10-CM | POA: Diagnosis present

## 2017-08-31 DIAGNOSIS — Z79890 Hormone replacement therapy (postmenopausal): Secondary | ICD-10-CM

## 2017-08-31 DIAGNOSIS — T380X5A Adverse effect of glucocorticoids and synthetic analogues, initial encounter: Secondary | ICD-10-CM | POA: Diagnosis present

## 2017-08-31 DIAGNOSIS — M792 Neuralgia and neuritis, unspecified: Secondary | ICD-10-CM

## 2017-08-31 DIAGNOSIS — M5416 Radiculopathy, lumbar region: Secondary | ICD-10-CM | POA: Diagnosis present

## 2017-08-31 DIAGNOSIS — Z7901 Long term (current) use of anticoagulants: Secondary | ICD-10-CM

## 2017-08-31 DIAGNOSIS — Z981 Arthrodesis status: Secondary | ICD-10-CM

## 2017-08-31 DIAGNOSIS — M1990 Unspecified osteoarthritis, unspecified site: Secondary | ICD-10-CM | POA: Diagnosis present

## 2017-08-31 DIAGNOSIS — S32059A Unspecified fracture of fifth lumbar vertebra, initial encounter for closed fracture: Secondary | ICD-10-CM | POA: Diagnosis present

## 2017-08-31 DIAGNOSIS — R000 Tachycardia, unspecified: Secondary | ICD-10-CM | POA: Diagnosis not present

## 2017-08-31 DIAGNOSIS — Z87891 Personal history of nicotine dependence: Secondary | ICD-10-CM

## 2017-08-31 DIAGNOSIS — D500 Iron deficiency anemia secondary to blood loss (chronic): Secondary | ICD-10-CM | POA: Diagnosis not present

## 2017-08-31 DIAGNOSIS — Z9889 Other specified postprocedural states: Secondary | ICD-10-CM | POA: Diagnosis not present

## 2017-08-31 DIAGNOSIS — G8918 Other acute postprocedural pain: Secondary | ICD-10-CM | POA: Diagnosis not present

## 2017-08-31 DIAGNOSIS — Y838 Other surgical procedures as the cause of abnormal reaction of the patient, or of later complication, without mention of misadventure at the time of the procedure: Secondary | ICD-10-CM | POA: Diagnosis present

## 2017-08-31 DIAGNOSIS — Z79899 Other long term (current) drug therapy: Secondary | ICD-10-CM | POA: Diagnosis not present

## 2017-08-31 DIAGNOSIS — Z419 Encounter for procedure for purposes other than remedying health state, unspecified: Secondary | ICD-10-CM

## 2017-08-31 DIAGNOSIS — M1711 Unilateral primary osteoarthritis, right knee: Secondary | ICD-10-CM | POA: Diagnosis present

## 2017-08-31 LAB — BASIC METABOLIC PANEL
Anion gap: 8 (ref 5–15)
BUN: 9 mg/dL (ref 6–20)
CO2: 26 mmol/L (ref 22–32)
Calcium: 9.2 mg/dL (ref 8.9–10.3)
Chloride: 103 mmol/L (ref 101–111)
Creatinine, Ser: 0.6 mg/dL (ref 0.44–1.00)
GFR calc Af Amer: >60 mL/min (ref >60)
GFR calc non Af Amer: >60 mL/min (ref >60)
Glucose, Bld: 104 mg/dL — ABNORMAL HIGH (ref 65–99)
Potassium: 3.5 mmol/L (ref 3.5–5.1)
Sodium: 137 mmol/L (ref 135–145)

## 2017-08-31 LAB — CBC
HCT: 29.2 % — ABNORMAL LOW (ref 36.0–46.0)
Hemoglobin: 8.7 g/dL — ABNORMAL LOW (ref 12.0–15.0)
MCH: 25.8 pg — ABNORMAL LOW (ref 26.0–34.0)
MCHC: 29.8 g/dL — ABNORMAL LOW (ref 30.0–36.0)
MCV: 86.6 fL (ref 78.0–100.0)
Platelets: 396 10*3/uL (ref 150–400)
RBC: 3.37 MIL/uL — ABNORMAL LOW (ref 3.87–5.11)
RDW: 13.7 % (ref 11.5–15.5)
WBC: 4 10*3/uL (ref 4.0–10.5)

## 2017-08-31 LAB — SURGICAL PCR SCREEN
MRSA, PCR: NEGATIVE
Staphylococcus aureus: NEGATIVE

## 2017-08-31 MED ORDER — BISACODYL 10 MG RE SUPP: 10.0000 mg | Freq: Every day | RECTAL | Status: DC | PRN

## 2017-08-31 MED ORDER — LIDOCAINE HCL (PF) 1 % IJ SOLN
5.0000 mL | Freq: Once | INTRAMUSCULAR | Status: AC
  Administered 2017-08-31: 2 mL via INTRADERMAL

## 2017-08-31 MED ORDER — OXYCODONE-ACETAMINOPHEN 5-325 MG PO TABS
1.0000 | ORAL_TABLET | ORAL | Status: DC | PRN
  Administered 2017-08-31 – 2017-09-01 (×6): 2 via ORAL
  Administered 2017-09-01: 1 via ORAL
  Filled 2017-08-31 (×6): qty 2

## 2017-08-31 MED ORDER — LACTATED RINGERS IV SOLN
INTRAVENOUS | Status: DC
  Administered 2017-08-31: via INTRAVENOUS

## 2017-08-31 MED ORDER — DIPHENHYDRAMINE-APAP (SLEEP) 25-500 MG PO TABS: 2.0000 | ORAL_TABLET | Freq: Every day | ORAL | Status: DC

## 2017-08-31 MED ORDER — ACETAMINOPHEN 500 MG PO TABS
1000.0000 mg | ORAL_TABLET | Freq: Every day | ORAL | Status: DC
  Administered 2017-08-31: 1000 mg via ORAL
  Filled 2017-08-31: qty 2

## 2017-08-31 MED ORDER — MUSCLE RUB 10-15 % EX CREA
TOPICAL_CREAM | CUTANEOUS | Status: DC | PRN
  Administered 2017-09-01 – 2017-09-03 (×3): via TOPICAL
  Filled 2017-08-31 (×3): qty 85

## 2017-08-31 MED ORDER — OXYCODONE-ACETAMINOPHEN 5-325 MG PO TABS
ORAL_TABLET | ORAL | Status: AC
  Filled 2017-08-31: qty 2

## 2017-08-31 MED ORDER — BUMETANIDE 0.5 MG PO TABS
0.5000 mg | ORAL_TABLET | Freq: Every day | ORAL | Status: DC | PRN
  Filled 2017-08-31: qty 1

## 2017-08-31 MED ORDER — VANCOMYCIN HCL IN DEXTROSE 1-5 GM/200ML-% IV SOLN
1000.0000 mg | INTRAVENOUS | Status: AC
  Administered 2017-09-01: 1000 mg via INTRAVENOUS
  Filled 2017-08-31: qty 200

## 2017-08-31 MED ORDER — IOPAMIDOL (ISOVUE-M 200) INJECTION 41%
20.0000 mL | Freq: Once | INTRAMUSCULAR | Status: AC
  Administered 2017-08-31: 12 mL via INTRATHECAL

## 2017-08-31 MED ORDER — DIAZEPAM 5 MG PO TABS
ORAL_TABLET | ORAL | Status: AC
  Administered 2017-08-31: 10 mg via ORAL
  Filled 2017-08-31: qty 2

## 2017-08-31 MED ORDER — CHLORHEXIDINE GLUCONATE CLOTH 2 % EX PADS
6.0000 | MEDICATED_PAD | Freq: Once | CUTANEOUS | Status: AC
  Administered 2017-09-01: 6 via TOPICAL

## 2017-08-31 MED ORDER — DIAZEPAM 5 MG PO TABS
10.0000 mg | ORAL_TABLET | Freq: Once | ORAL | Status: AC
  Administered 2017-08-31: 10 mg via ORAL

## 2017-08-31 MED ORDER — ENOXAPARIN SODIUM 40 MG/0.4ML ~~LOC~~ SOLN
40.0000 mg | SUBCUTANEOUS | Status: DC
  Administered 2017-08-31 – 2017-09-05 (×5): 40 mg via SUBCUTANEOUS
  Filled 2017-08-31 (×5): qty 0.4

## 2017-08-31 MED ORDER — PANTOPRAZOLE SODIUM 40 MG PO TBEC
40.0000 mg | DELAYED_RELEASE_TABLET | Freq: Every day | ORAL | Status: DC
  Administered 2017-08-31 – 2017-09-05 (×6): 40 mg via ORAL
  Filled 2017-08-31 (×6): qty 1

## 2017-08-31 MED ORDER — KETOROLAC TROMETHAMINE 15 MG/ML IJ SOLN: 15.0000 mg | Freq: Once | INTRAMUSCULAR | Status: AC

## 2017-08-31 MED ORDER — DOCUSATE SODIUM 100 MG PO CAPS
100.0000 mg | ORAL_CAPSULE | Freq: Two times a day (BID) | ORAL | Status: DC
  Administered 2017-08-31 – 2017-09-04 (×9): 100 mg via ORAL
  Filled 2017-08-31 (×10): qty 1

## 2017-08-31 MED ORDER — LEVOTHYROXINE SODIUM 50 MCG PO TABS
50.0000 ug | ORAL_TABLET | Freq: Every day | ORAL | Status: DC
  Administered 2017-09-01 – 2017-09-05 (×5): 50 ug via ORAL
  Filled 2017-08-31 (×5): qty 1

## 2017-08-31 MED ORDER — CHLORHEXIDINE GLUCONATE CLOTH 2 % EX PADS
6.0000 | MEDICATED_PAD | Freq: Once | CUTANEOUS | Status: AC
  Administered 2017-08-31: 6 via TOPICAL

## 2017-08-31 MED ORDER — FLEET ENEMA 7-19 GM/118ML RE ENEM: 1.0000 | ENEMA | Freq: Once | RECTAL | Status: DC | PRN

## 2017-08-31 MED ORDER — ONDANSETRON HCL 4 MG/2ML IJ SOLN: 4.0000 mg | Freq: Four times a day (QID) | INTRAMUSCULAR | Status: DC | PRN

## 2017-08-31 MED ORDER — MECLIZINE HCL 25 MG PO TABS
25.0000 mg | ORAL_TABLET | Freq: Three times a day (TID) | ORAL | Status: DC | PRN
  Administered 2017-09-01: 25 mg via ORAL
  Filled 2017-08-31 (×2): qty 1

## 2017-08-31 MED ORDER — TIZANIDINE HCL 4 MG PO TABS
4.0000 mg | ORAL_TABLET | Freq: Three times a day (TID) | ORAL | Status: DC | PRN
  Filled 2017-08-31: qty 1

## 2017-08-31 MED ORDER — MUPIROCIN 2 % EX OINT
1.0000 "application " | TOPICAL_OINTMENT | Freq: Two times a day (BID) | CUTANEOUS | Status: DC
  Administered 2017-08-31 – 2017-09-01 (×3): 1 via NASAL
  Filled 2017-08-31 (×2): qty 22

## 2017-08-31 MED ORDER — DIPHENHYDRAMINE HCL 25 MG PO CAPS
50.0000 mg | ORAL_CAPSULE | Freq: Every day | ORAL | Status: DC
  Administered 2017-08-31: 50 mg via ORAL
  Filled 2017-08-31: qty 2

## 2017-08-31 MED ORDER — SENNA 8.6 MG PO TABS
1.0000 | ORAL_TABLET | Freq: Two times a day (BID) | ORAL | Status: DC
  Administered 2017-08-31 – 2017-09-05 (×10): 8.6 mg via ORAL
  Filled 2017-08-31 (×10): qty 1

## 2017-08-31 MED ORDER — METHOCARBAMOL 500 MG PO TABS
500.0000 mg | ORAL_TABLET | Freq: Four times a day (QID) | ORAL | Status: DC | PRN
  Administered 2017-08-31 – 2017-09-01 (×2): 500 mg via ORAL
  Filled 2017-08-31 (×2): qty 1

## 2017-08-31 MED ORDER — LIDOCAINE HCL (PF) 1 % IJ SOLN
INTRAMUSCULAR | Status: AC
  Administered 2017-08-31: 2 mL via INTRADERMAL
  Filled 2017-08-31: qty 5

## 2017-08-31 MED ORDER — KETOROLAC TROMETHAMINE 30 MG/ML IJ SOLN
INTRAMUSCULAR | Status: AC
  Administered 2017-08-31: 15 mg via INTRAMUSCULAR
  Filled 2017-08-31: qty 1

## 2017-08-31 MED ORDER — POLYETHYLENE GLYCOL 3350 17 G PO PACK
17.0000 g | PACK | Freq: Every day | ORAL | Status: DC | PRN
  Administered 2017-09-03 (×2): 17 g via ORAL
  Filled 2017-08-31 (×2): qty 1

## 2017-08-31 MED ORDER — KETOROLAC TROMETHAMINE 15 MG/ML IJ SOLN
15.0000 mg | Freq: Four times a day (QID) | INTRAMUSCULAR | Status: DC | PRN
  Administered 2017-09-01 (×3): 15 mg via INTRAVENOUS
  Filled 2017-08-31 (×3): qty 1

## 2017-08-31 MED ORDER — MORPHINE SULFATE (PF) 4 MG/ML IV SOLN
4.0000 mg | INTRAVENOUS | Status: DC | PRN
Start: 2017-08-31 — End: 2017-09-02
  Administered 2017-08-31 – 2017-09-01 (×4): 4 mg via INTRAVENOUS
  Filled 2017-08-31 (×4): qty 1

## 2017-08-31 MED ORDER — IOPAMIDOL (ISOVUE-M 200) INJECTION 41%
INTRAMUSCULAR | Status: AC
  Administered 2017-08-31: 12 mL via INTRATHECAL
  Filled 2017-08-31: qty 10

## 2017-08-31 NOTE — Procedures (Signed)
Ms. Emalyn Schou is an 84 year old individual who had a decompression from L2-L5.  Several weeks after her fusion she failed at the L2 level where she fractured both pedicles.  She underwent revision of the upper portion of her fusion with fixation to the T10 vertebrae.  Subsequently she developed severe pain in the buttock and left lower extremity that has been progressively worsening.  CT scan demonstrates that she has fracture through the pedicle of L5.  She is now being evaluated with a myelogram in an effort to ascertain what would be the best approach to repairing this process.  Pre op Dx: Acute left L5 radiculopathy Post op Dx: Same Procedure: Lumbar myelogram Surgeon: Jacqueline Delapena Puncture level: L3-4 Fluid color: Clear colorless Injection: Isovue-200, 12 mL Findings: Immediate travel of dye into the thoracic spine.  Poor visualization of the lower lumbar spine due to mobilization of dye.  Further evaluation with CT scanning.

## 2017-09-01 ENCOUNTER — Inpatient Hospital Stay (HOSPITAL_COMMUNITY): Payer: Medicare Other | Admitting: Anesthesiology

## 2017-09-01 ENCOUNTER — Encounter (HOSPITAL_COMMUNITY)
Admission: AD | Disposition: A | Discharge status: Skilled Nursing Facility | Payer: Self-pay | Source: Ambulatory Visit | Attending: Neurological Surgery

## 2017-09-01 ENCOUNTER — Inpatient Hospital Stay (HOSPITAL_COMMUNITY): Admission: RE | Admit: 2017-09-01 | Payer: Medicare Other | Source: Ambulatory Visit | Admitting: Neurological Surgery

## 2017-09-01 ENCOUNTER — Encounter (HOSPITAL_COMMUNITY): Payer: Self-pay

## 2017-09-01 ENCOUNTER — Inpatient Hospital Stay (HOSPITAL_COMMUNITY): Payer: Medicare Other

## 2017-09-01 HISTORY — PX: APPLICATION OF ROBOTIC ASSISTANCE FOR SPINAL PROCEDURE: SHX6753

## 2017-09-01 LAB — POCT I-STAT 7, (LYTES, BLD GAS, ICA,H+H)
Acid-Base Excess: 4 mmol/L — ABNORMAL HIGH (ref 0.0–2.0)
Bicarbonate: 30 mmol/L — ABNORMAL HIGH (ref 20.0–28.0)
Calcium, Ion: 1.22 mmol/L (ref 1.15–1.40)
HCT: 26 % — ABNORMAL LOW (ref 36.0–46.0)
Hemoglobin: 8.8 g/dL — ABNORMAL LOW (ref 12.0–15.0)
O2 Saturation: 100 %
Patient temperature: 35.9
Potassium: 3.6 mmol/L (ref 3.5–5.1)
Sodium: 136 mmol/L (ref 135–145)
TCO2: 32 mmol/L (ref 22–32)
pCO2 arterial: 48.5 mmHg — ABNORMAL HIGH (ref 32.0–48.0)
pH, Arterial: 7.394 (ref 7.350–7.450)
pO2, Arterial: 505 mmHg — ABNORMAL HIGH (ref 83.0–108.0)

## 2017-09-01 LAB — POCT I-STAT 4, (NA,K, GLUC, HGB,HCT)
Glucose, Bld: 112 mg/dL — ABNORMAL HIGH (ref 65–99)
HCT: 28 % — ABNORMAL LOW (ref 36.0–46.0)
Hemoglobin: 9.5 g/dL — ABNORMAL LOW (ref 12.0–15.0)
Potassium: 3.7 mmol/L (ref 3.5–5.1)
Sodium: 137 mmol/L (ref 135–145)

## 2017-09-01 SURGERY — POSTERIOR LUMBAR FUSION 2 WITH HARDWARE REMOVAL
Anesthesia: General | Site: Back

## 2017-09-01 MED ORDER — THROMBIN 5000 UNITS EX SOLR
CUTANEOUS | Status: AC
  Filled 2017-09-01: qty 10000

## 2017-09-01 MED ORDER — 0.9 % SODIUM CHLORIDE (POUR BTL) OPTIME
TOPICAL | Status: DC | PRN
  Administered 2017-09-01: 1000 mL

## 2017-09-01 MED ORDER — ROCURONIUM BROMIDE 50 MG/5ML IV SOLN
INTRAVENOUS | Status: AC
  Filled 2017-09-01: qty 2

## 2017-09-01 MED ORDER — FENTANYL CITRATE (PF) 250 MCG/5ML IJ SOLN
INTRAMUSCULAR | Status: AC
  Filled 2017-09-01: qty 5

## 2017-09-01 MED ORDER — LIDOCAINE HCL (CARDIAC) PF 100 MG/5ML IV SOSY
PREFILLED_SYRINGE | INTRAVENOUS | Status: DC | PRN
  Administered 2017-09-01: 60 mg via INTRAVENOUS

## 2017-09-01 MED ORDER — SUGAMMADEX SODIUM 200 MG/2ML IV SOLN
INTRAVENOUS | Status: AC
  Filled 2017-09-01: qty 2

## 2017-09-01 MED ORDER — BACITRACIN 50000 UNITS IM SOLR
INTRAMUSCULAR | Status: DC | PRN
  Administered 2017-09-01: 500 mL

## 2017-09-01 MED ORDER — THROMBIN 5000 UNITS EX SOLR
OROMUCOSAL | Status: DC | PRN
  Administered 2017-09-01: 5 mL via TOPICAL

## 2017-09-01 MED ORDER — THROMBIN 20000 UNITS EX SOLR
CUTANEOUS | Status: AC
  Filled 2017-09-01: qty 20000

## 2017-09-01 MED ORDER — DEXAMETHASONE SODIUM PHOSPHATE 10 MG/ML IJ SOLN
INTRAMUSCULAR | Status: DC | PRN
  Administered 2017-09-01: 10 mg via INTRAVENOUS

## 2017-09-01 MED ORDER — BUPIVACAINE HCL (PF) 0.5 % IJ SOLN
INTRAMUSCULAR | Status: DC | PRN
  Administered 2017-09-01: 5 mL

## 2017-09-01 MED ORDER — SODIUM CHLORIDE 0.9 % IV SOLN
INTRAVENOUS | Status: DC | PRN
  Administered 2017-09-01 (×3): via INTRAVENOUS

## 2017-09-01 MED ORDER — FENTANYL CITRATE (PF) 100 MCG/2ML IJ SOLN
INTRAMUSCULAR | Status: DC | PRN
  Administered 2017-09-01 (×2): 50 ug via INTRAVENOUS
  Administered 2017-09-01: 100 ug via INTRAVENOUS
  Administered 2017-09-01 (×4): 50 ug via INTRAVENOUS

## 2017-09-01 MED ORDER — LIDOCAINE-EPINEPHRINE 1 %-1:100000 IJ SOLN
INTRAMUSCULAR | Status: AC
  Filled 2017-09-01: qty 1

## 2017-09-01 MED ORDER — SUGAMMADEX SODIUM 200 MG/2ML IV SOLN
INTRAVENOUS | Status: DC | PRN
  Administered 2017-09-01: 200 mg via INTRAVENOUS

## 2017-09-01 MED ORDER — LIDOCAINE-EPINEPHRINE 1 %-1:100000 IJ SOLN
INTRAMUSCULAR | Status: DC | PRN
  Administered 2017-09-01: 5 mL

## 2017-09-01 MED ORDER — LACTATED RINGERS IV SOLN
INTRAVENOUS | Status: DC
  Administered 2017-09-01 (×2): via INTRAVENOUS

## 2017-09-01 MED ORDER — PHENYLEPHRINE HCL 10 MG/ML IJ SOLN
INTRAVENOUS | Status: DC | PRN
  Administered 2017-09-01: 50 ug/min via INTRAVENOUS

## 2017-09-01 MED ORDER — ONDANSETRON HCL 4 MG/2ML IJ SOLN
INTRAMUSCULAR | Status: AC
  Filled 2017-09-01: qty 2

## 2017-09-01 MED ORDER — PROMETHAZINE HCL 25 MG/ML IJ SOLN: 6.2500 mg | INTRAMUSCULAR | Status: DC | PRN

## 2017-09-01 MED ORDER — HYDROMORPHONE HCL 2 MG/ML IJ SOLN: 0.3000 mg | INTRAMUSCULAR | Status: DC | PRN

## 2017-09-01 MED ORDER — ROCURONIUM BROMIDE 100 MG/10ML IV SOLN
INTRAVENOUS | Status: DC | PRN
  Administered 2017-09-01: 50 mg via INTRAVENOUS
  Administered 2017-09-01: 10 mg via INTRAVENOUS
  Administered 2017-09-01 (×2): 20 mg via INTRAVENOUS

## 2017-09-01 MED ORDER — PROPOFOL 10 MG/ML IV BOLUS
INTRAVENOUS | Status: DC | PRN
  Administered 2017-09-01: 130 mg via INTRAVENOUS

## 2017-09-01 MED ORDER — BUPIVACAINE HCL (PF) 0.5 % IJ SOLN
INTRAMUSCULAR | Status: AC
  Filled 2017-09-01: qty 30

## 2017-09-01 MED ORDER — ONDANSETRON HCL 4 MG/2ML IJ SOLN
INTRAMUSCULAR | Status: DC | PRN
  Administered 2017-09-01: 4 mg via INTRAVENOUS

## 2017-09-01 SURGICAL SUPPLY — 85 items
BAG DECANTER FOR FLEXI CONT (MISCELLANEOUS) ×2 IMPLANT
BASKET BONE COLLECTION (BASKET) ×2 IMPLANT
BIT DRILL LONG 3.0X30 (BIT) ×2 IMPLANT
BIT DRILL LONG 3X80 (BIT) IMPLANT
BIT DRILL LONG 4X80 (BIT) ×2 IMPLANT
BIT DRILL SHORT 3.0X30 (BIT) IMPLANT
BIT DRILL SHORT 3X80 (BIT) IMPLANT
BLADE CLIPPER SURG (BLADE) IMPLANT
BLADE SURG 11 STRL SS (BLADE) IMPLANT
BUR MATCHSTICK NEURO 3.0 LAGG (BURR) ×2 IMPLANT
CANISTER SUCT 3000ML PPV (MISCELLANEOUS) ×2 IMPLANT
CONNECTOR RELINE-O 5-6/5-6 4H (Connector) ×4 IMPLANT
CONT SPEC 4OZ CLIKSEAL STRL BL (MISCELLANEOUS) ×2 IMPLANT
COVER BACK TABLE 60X90IN (DRAPES) ×2 IMPLANT
DECANTER SPIKE VIAL GLASS SM (MISCELLANEOUS) ×2 IMPLANT
DERMABOND ADVANCED (GAUZE/BANDAGES/DRESSINGS) ×1
DERMABOND ADVANCED .7 DNX12 (GAUZE/BANDAGES/DRESSINGS) ×1 IMPLANT
DEVICE DISSECT PLASMABLAD 3.0S (MISCELLANEOUS) ×1 IMPLANT
DRAPE C-ARM 42X72 X-RAY (DRAPES) ×2 IMPLANT
DRAPE C-ARMOR (DRAPES) ×2 IMPLANT
DRAPE HALF SHEET 40X57 (DRAPES) IMPLANT
DRAPE LAPAROTOMY 100X72X124 (DRAPES) ×2 IMPLANT
DRAPE SHEET LG 3/4 BI-LAMINATE (DRAPES) ×2 IMPLANT
DRSG OPSITE POSTOP 4X8 (GAUZE/BANDAGES/DRESSINGS) ×2 IMPLANT
DURAPREP 26ML APPLICATOR (WOUND CARE) ×2 IMPLANT
DURASEAL APPLICATOR TIP (TIP) IMPLANT
DURASEAL SPINE SEALANT 3ML (MISCELLANEOUS) IMPLANT
ELECT BLADE 4.0 EZ CLEAN MEGAD (MISCELLANEOUS)
ELECT REM PT RETURN 9FT ADLT (ELECTROSURGICAL) ×2
ELECTRODE BLDE 4.0 EZ CLN MEGD (MISCELLANEOUS) IMPLANT
ELECTRODE REM PT RTRN 9FT ADLT (ELECTROSURGICAL) ×1 IMPLANT
GAUZE SPONGE 4X4 12PLY STRL (GAUZE/BANDAGES/DRESSINGS) IMPLANT
GAUZE SPONGE 4X4 16PLY XRAY LF (GAUZE/BANDAGES/DRESSINGS) IMPLANT
GLOVE BIOGEL PI IND STRL 7.5 (GLOVE) ×1 IMPLANT
GLOVE BIOGEL PI IND STRL 8 (GLOVE) ×2 IMPLANT
GLOVE BIOGEL PI IND STRL 8.5 (GLOVE) ×2 IMPLANT
GLOVE BIOGEL PI INDICATOR 7.5 (GLOVE) ×1
GLOVE BIOGEL PI INDICATOR 8 (GLOVE) ×2
GLOVE BIOGEL PI INDICATOR 8.5 (GLOVE) ×2
GLOVE ECLIPSE 7.5 STRL STRAW (GLOVE) ×10 IMPLANT
GLOVE ECLIPSE 8.5 STRL (GLOVE) ×4 IMPLANT
GOWN STRL REUS W/ TWL LRG LVL3 (GOWN DISPOSABLE) ×1 IMPLANT
GOWN STRL REUS W/ TWL XL LVL3 (GOWN DISPOSABLE) IMPLANT
GOWN STRL REUS W/TWL 2XL LVL3 (GOWN DISPOSABLE) ×6 IMPLANT
GOWN STRL REUS W/TWL LRG LVL3 (GOWN DISPOSABLE) ×1
GOWN STRL REUS W/TWL XL LVL3 (GOWN DISPOSABLE)
GUIDEWIRE NITINOL BEVEL TIP (WIRE) ×10 IMPLANT
HEMOSTAT POWDER KIT SURGIFOAM (HEMOSTASIS) ×2 IMPLANT
KIT BASIN OR (CUSTOM PROCEDURE TRAY) ×2 IMPLANT
KIT INFUSE MEDIUM (Orthopedic Implant) ×2 IMPLANT
KIT SPINE MAZOR X ROBO DISP (MISCELLANEOUS) ×2 IMPLANT
KIT TURNOVER KIT B (KITS) ×2 IMPLANT
MILL MEDIUM DISP (BLADE) ×2 IMPLANT
NEEDLE HYPO 22GX1.5 SAFETY (NEEDLE) ×2 IMPLANT
NEEDLE SPNL 18GX3.5 QUINCKE PK (NEEDLE) ×2 IMPLANT
NS IRRIG 1000ML POUR BTL (IV SOLUTION) ×2 IMPLANT
PACK LAMINECTOMY NEURO (CUSTOM PROCEDURE TRAY) ×2 IMPLANT
PAD ARMBOARD 7.5X6 YLW CONV (MISCELLANEOUS) ×4 IMPLANT
PATTIES SURGICAL .5 X1 (DISPOSABLE) IMPLANT
PATTIES SURGICAL 1X1 (DISPOSABLE) ×2 IMPLANT
PIN HEAD 2.5X60MM (PIN) IMPLANT
PLASMABLADE 3.0S (MISCELLANEOUS) ×2
RASP 3.0MM (RASP) ×2 IMPLANT
ROD RELINE-O 5.5X120MM LORD (Rod) ×4 IMPLANT
ROD RELINE-O 5.5X300 STRT NS (Rod) ×1 IMPLANT
ROD RELINE-O 5.5X300MM STRT (Rod) ×1 IMPLANT
SCREW CERV PA RELINE 8.5X80 (Screw) ×4 IMPLANT
SCREW LOCK RELINE 5.5 TULIP (Screw) ×18 IMPLANT
SCREW MAS RELINE 6.5X45 POLY (Screw) ×2 IMPLANT
SCREW RELINE MAS POLY 7.5X45MM (Screw) ×4 IMPLANT
SCREW SCHANZ SA 4.0MM (MISCELLANEOUS) ×2 IMPLANT
SPONGE LAP 4X18 RFD (DISPOSABLE) IMPLANT
SPONGE SURGIFOAM ABS GEL 100 (HEMOSTASIS) IMPLANT
SUT PROLENE 6 0 BV (SUTURE) IMPLANT
SUT VIC AB 1 CT1 18XBRD ANBCTR (SUTURE) ×1 IMPLANT
SUT VIC AB 1 CT1 8-18 (SUTURE) ×1
SUT VIC AB 2-0 CP2 18 (SUTURE) ×2 IMPLANT
SUT VIC AB 3-0 SH 8-18 (SUTURE) ×6 IMPLANT
SYR 3ML LL SCALE MARK (SYRINGE) ×8 IMPLANT
SYR 5ML LL (SYRINGE) IMPLANT
TOWEL GREEN STERILE (TOWEL DISPOSABLE) ×2 IMPLANT
TOWEL GREEN STERILE FF (TOWEL DISPOSABLE) ×2 IMPLANT
TRAY FOLEY MTR SLVR 16FR STAT (SET/KITS/TRAYS/PACK) ×2 IMPLANT
TUBE MAZOR SA REDUCTION (TUBING) ×2 IMPLANT
WATER STERILE IRR 1000ML POUR (IV SOLUTION) ×2 IMPLANT

## 2017-09-01 NOTE — Op Note (Signed)
Date of surgery: 09/01/2017 Preoperative diagnosis: L5 fracture with loss of fixation and a left lumbar radiculopathy Postoperative diagnosis: Same Procedure: Revision of fixation from T10 to the ileum with the robotic assistance and placement of S1 and S2 alar screws decompression of the left L5 nerve root, arthrodesis with infuse L5-S1, replacement of right L3 pedicle screw. Surgeon: Kristeen Miss First assistant: Seizure: General endotracheal Indications: Roshawna Colclasure is an 84 year old individual who had undergone surgical decompression from L2-L5 about 3 months ago.  She developed bilateral pedicle fractures at L2 and this required revision of her upper fixation which was performed to T10.  Then a few weeks ago she developed severe back and left leg pain was found to have fracture of L5.  She is advised that she would need a myelogram which was recently performed and demonstrated that she had compression of the left L5 nerve root and loss of fixation.  It was then planned that she would need fixation to the ileum with removal of the hardware at L5.  She is now taken to the operating room for that procedure.  Procedure the patient was brought to the operating room supine on stretcher after the smooth induction of general endotracheal anesthesia with placement of appropriate monitoring lines including arterial lines and Foley she was turned prone onto the [place] table.  The Mazor robot was connected to the table and the patient's back was prepped with alcohol DuraPrep and draped in a sterile fashion.  The right posterior superior iliac crest had a small stab incision created and a guidepin was placed into this region to attach the robot firmly to the patient's back.  Then a midline incision was created in the back was opened to expose hardware from the area just above L3 down to L5.  A rod segment above L3 was then identified and cut on both sides using a metal cutting tool on high-speed drill.  Then  the robot was attached and registered in AP and lateral radiographs were obtained to register the image with the planned placement of screws in S1 the ala and L3.  The S1 screws were placed percutaneously and were 7.5 x 45 mm long screws.  The alar screws were then placed open and these were 8.5 x 80 mm screws placed into S2 through the ala into the ileum.  Once the screws were securely placed here the old L3 screw on the right side was removed because there was significant evidence of cut out for that screw it was then replaced with a robotically assisted screw placement replacing the original size screws 6.5 x 45 mm into that right pedicle.  Next the L5 nerve root on the left side was decompressed by creating a large laminotomy over L5.  There is significant bony material from the fractured pedicle that was creating substantial pressure on the L5 nerve root.  This was removed in the path of the L5 nerve root was decompressed into the extraforaminal zone once this was verified the area was covered with Gelfoam and attention was then turned to placing rods between the cut and from L3 and the L3 pedicles down to the S2 alar screws.  On the right side a 120 mm rod was additionally contoured and placed in the sagittals and secured in a neutral construct on the left side 130 mm rod was required for the same purpose.  An in-line connector was used to reattach the old construct and attached to the new construct such that fixation extended  now from teeth 10 to the ileum via the S2 alar screws.  The ala of the sacrum was then decorticated superiorly as was the transverse process in this area was packed with infuse on both sides.  Once the rods were placed and there were secured the area was inspected carefully hemostasis was achieved the placement of the rods took some time as alignment of all the components was somewhat challenging.  The blood loss for the procedure was estimated at 1900 cc and 4 units of packed cells  were transfused to the patient patient will be returned to the recovery to the ICU for further postoperative monitoring the incision itself was closed with #1 Vicryl interrupted fashion 2-0 Vicryl was used in the subcutaneous tissues and 3-0 Vicryl subcuticularly.  Dermabond was used on the skin.  Patient was returned to the recovery room in stable condition.

## 2017-09-01 NOTE — Anesthesia Procedure Notes (Signed)
Procedure Name: Intubation Date/Time: 09/01/2017 6:40 PM Performed by: Aleem Elza T, CRNA Pre-anesthesia Checklist: Patient identified, Emergency Drugs available, Suction available and Patient being monitored Patient Re-evaluated:Patient Re-evaluated prior to induction Oxygen Delivery Method: Circle system utilized Preoxygenation: Pre-oxygenation with 100% oxygen Induction Type: IV induction Ventilation: Mask ventilation without difficulty Laryngoscope Size: Miller and 2 Grade View: Grade I Tube type: Oral Tube size: 7.5 mm Number of attempts: 1 Airway Equipment and Method: Patient positioned with wedge pillow and Stylet Placement Confirmation: ETT inserted through vocal cords under direct vision,  positive ETCO2 and breath sounds checked- equal and bilateral Secured at: 21 cm Tube secured with: Tape Dental Injury: Teeth and Oropharynx as per pre-operative assessment

## 2017-09-01 NOTE — Anesthesia Procedure Notes (Signed)
Arterial Line Insertion Start/End5/31/2019 6:55 PM, 09/01/2017 7:00 PM Performed by: Suzette Battiest, MD, anesthesiologist  Patient location: Pre-op. Preanesthetic checklist: patient identified, IV checked, site marked, risks and benefits discussed, surgical consent, monitors and equipment checked, pre-op evaluation, timeout performed and anesthesia consent Lidocaine 1% used for infiltration Right, radial was placed Catheter size: 20 Fr Hand hygiene performed  and maximum sterile barriers used   Attempts: 1 Procedure performed without using ultrasound guided technique. Following insertion, dressing applied and Biopatch. Post procedure assessment: normal and unchanged  Patient tolerated the procedure well with no immediate complications.

## 2017-09-01 NOTE — Care Management Note (Signed)
Case Management Note  Patient Details  Name: [name] MRN: [id number] Date of Birth: 1932-06-19  Subjective/Objective:                  [name] is an 84 year old individual who had a decompression from L2-L5.  Several weeks after her fusion she failed at the L2 level where she fractured both pedicles.  She underwent revision of the upper portion of her fusion with fixation to the T10 vertebrae.  Subsequently she developed severe pain in the buttock and left lower extremity that has been progressively worsening.  CT scan demonstrates that she has fracture through the pedicle of L5.  PTA, pt resided at home with spouse.  She was receiving [place] services (PT/OT) with [facility] PTA.     Action/Plan: Pt to OR today.  Will continue to follow for home needs as pt progresses.  Expected Discharge Date:                  Expected Discharge Plan:     In-House Referral:     Discharge planning Services     Post Acute Care Choice:    Choice offered to:     DME Arranged:    DME Agency:     HH Arranged:    HH Agency:     Status of Service:     If discussed at [employer], dates discussed:    Additional Comments:  Ella Bodo, RN 09/01/2017, 5:24 PM

## 2017-09-01 NOTE — Anesthesia Preprocedure Evaluation (Addendum)
Anesthesia Evaluation  Patient identified by MRN, date of birth, ID band Patient awake    Reviewed: Allergy & Precautions, NPO status , Patient's Chart, lab work & pertinent test results  History of Anesthesia Complications (+) PONV  Airway Mallampati: II  TM Distance: >3 FB Neck ROM: Full    Dental  (+) Dental Advisory Given   Pulmonary former smoker,    breath sounds clear to auscultation       Cardiovascular negative cardio ROS   Rhythm:Regular Rate:Normal     Neuro/Psych negative neurological ROS     GI/Hepatic Neg liver ROS, GERD  ,  Endo/Other  Hypothyroidism   Renal/GU negative Renal ROS     Musculoskeletal  (+) Arthritis ,   Abdominal   Peds  Hematology negative hematology ROS (+)   Anesthesia Other Findings   Reproductive/Obstetrics                            Lab Results  Component Value Date   WBC 4.0 08/31/2017   HGB 8.7 (L) 08/31/2017   HCT 29.2 (L) 08/31/2017   MCV 86.6 08/31/2017   PLT 396 08/31/2017   Lab Results  Component Value Date   CREATININE 0.60 08/31/2017   BUN 9 08/31/2017   NA 137 08/31/2017   K 3.5 08/31/2017   CL 103 08/31/2017   CO2 26 08/31/2017    Anesthesia Physical Anesthesia Plan  ASA: III  Anesthesia Plan: General   Post-op Pain Management:    Induction: Intravenous  PONV Risk Score and Plan: 4 or greater and Dexamethasone, Ondansetron and Treatment may vary due to age or medical condition  Airway Management Planned: Oral ETT  Additional Equipment: Arterial line  Intra-op Plan:   Post-operative Plan: Extubation in OR and Possible Post-op intubation/ventilation  Informed Consent: I have reviewed the patients History and Physical, chart, labs and discussed the procedure including the risks, benefits and alternatives for the proposed anesthesia with the patient or authorized representative who has indicated his/her understanding  and acceptance.   Dental advisory given  Plan Discussed with: CRNA  Anesthesia Plan Comments:        Anesthesia Quick Evaluation

## 2017-09-01 NOTE — Transfer of Care (Signed)
Immediate Anesthesia Transfer of Care Note  Patient: [name]  Procedure(s) Performed: Revision of lumbar fusion secondary to Lumbar five fracture/Revision of fixation Lumbar four to Ileum/With Mazor (N/A Back) APPLICATION OF ROBOTIC ASSISTANCE FOR SPINAL PROCEDURE (N/A Back)  Patient Location: PACU  Anesthesia Type:General  Level of Consciousness: awake, sedated and patient cooperative  Airway & Oxygen Therapy: Patient connected to nasal cannula oxygen  Post-op Assessment: Report given to RN, Post -op Vital signs reviewed and stable and Patient moving all extremities X 4  Post vital signs: Reviewed and stable  Last Vitals:  Vitals Value Taken Time  BP 143/94 09/01/2017 11:46 PM  Temp    Pulse 108 09/01/2017 11:48 PM  Resp 16 09/01/2017 11:48 PM  SpO2 100 % 09/01/2017 11:48 PM  Vitals shown include unvalidated device data.  Last Pain:  Vitals:   09/01/17 1608  TempSrc: Oral  PainSc: 8       Patients Stated Pain Goal: 4 (16/01/09 3235)  Complications: No apparent anesthesia complications

## 2017-09-01 NOTE — Progress Notes (Signed)
Report given to short stay for patient's surgery. OR staff at bedside to take patient down. Husband at bedside aware of update.

## 2017-09-01 NOTE — Care Management Note (Signed)
Case Management Note  Patient Details  Name: DYLANIE QUESENBERRY MRN: [id number] Date of Birth: 1932/08/19  Subjective/Objective:                  Ms. Moniqua Engebretsen is an 84 year old individual who had a decompression from L2-L5.  Several weeks after her fusion she failed at the L2 level where she fractured both pedicles.  She underwent revision of the upper portion of her fusion with fixation to the T10 vertebrae.  Subsequently she developed severe pain in the buttock and left lower extremity that has been progressively worsening.  CT scan demonstrates that she has fracture through the pedicle of L5.  PTA, pt resided at home with spouse.  She was receiving [place] services (PT/OT) with [facility] PTA.     Action/Plan: Pt to OR today.  Will continue to follow for home needs as pt progresses.  Expected Discharge Date:                  Expected Discharge Plan:  [place]  In-House Referral:     Discharge planning Services  CM Consult  Post Acute Care Choice:  Resumption of Svcs/PTA Provider Choice offered to:  Patient  DME Arranged:    DME Agency:     HH Arranged:    [place] Agency:     Status of Service:  In process, will continue to follow  If discussed at Long Length of Stay Meetings, dates discussed:    Additional Comments:  Ella Bodo, RN 09/01/2017, 5:27 PM

## 2017-09-01 NOTE — Progress Notes (Signed)
Patient ID: [name], female   DOB: 08-Jul-1932, 84 y.o.   MRN: [id number] Reviewed CT myelogram plan is for revision of inferior surgery to include the ileum and S1 fixation with removal of L5 screws and decompression of the left and right L5 nerve roots.

## 2017-09-02 DIAGNOSIS — G8918 Other acute postprocedural pain: Secondary | ICD-10-CM

## 2017-09-02 DIAGNOSIS — Z9889 Other specified postprocedural states: Secondary | ICD-10-CM

## 2017-09-02 DIAGNOSIS — I10 Essential (primary) hypertension: Secondary | ICD-10-CM

## 2017-09-02 DIAGNOSIS — D500 Iron deficiency anemia secondary to blood loss (chronic): Secondary | ICD-10-CM

## 2017-09-02 LAB — CBC
HCT: 32.4 % — ABNORMAL LOW (ref 36.0–46.0)
Hemoglobin: 10.7 g/dL — ABNORMAL LOW (ref 12.0–15.0)
MCH: 28 pg (ref 26.0–34.0)
MCHC: 33 g/dL (ref 30.0–36.0)
MCV: 84.8 fL (ref 78.0–100.0)
Platelets: 292 10*3/uL (ref 150–400)
RBC: 3.82 MIL/uL — ABNORMAL LOW (ref 3.87–5.11)
RDW: 13.5 % (ref 11.5–15.5)
WBC: 5.2 10*3/uL (ref 4.0–10.5)

## 2017-09-02 LAB — BPAM FFP
Blood Product Expiration Date: 201906052359
Blood Product Expiration Date: 201906052359
ISSUE DATE / TIME: 201905312211
ISSUE DATE / TIME: 201905312250
Unit Type and Rh: 6200
Unit Type and Rh: 6200

## 2017-09-02 LAB — BASIC METABOLIC PANEL
Anion gap: 7 (ref 5–15)
BUN: 7 mg/dL (ref 6–20)
CO2: 26 mmol/L (ref 22–32)
Calcium: 8.6 mg/dL — ABNORMAL LOW (ref 8.9–10.3)
Chloride: 102 mmol/L (ref 101–111)
Creatinine, Ser: 0.61 mg/dL (ref 0.44–1.00)
GFR calc Af Amer: >60 mL/min (ref >60)
GFR calc non Af Amer: >60 mL/min (ref >60)
Glucose, Bld: 178 mg/dL — ABNORMAL HIGH (ref 65–99)
Potassium: 3.8 mmol/L (ref 3.5–5.1)
Sodium: 135 mmol/L (ref 135–145)

## 2017-09-02 LAB — PREPARE FRESH FROZEN PLASMA
Unit division: 0
Unit division: 0

## 2017-09-02 MED ORDER — CEFAZOLIN SODIUM-DEXTROSE 2-4 GM/100ML-% IV SOLN: 2.0000 g | Freq: Three times a day (TID) | INTRAVENOUS | Status: DC

## 2017-09-02 MED ORDER — KETOROLAC TROMETHAMINE 15 MG/ML IJ SOLN
15.0000 mg | Freq: Three times a day (TID) | INTRAMUSCULAR | Status: AC | PRN
  Administered 2017-09-02 – 2017-09-03 (×3): 15 mg via INTRAVENOUS
  Filled 2017-09-02 (×4): qty 1

## 2017-09-02 MED ORDER — SODIUM CHLORIDE 0.9% FLUSH: 3.0000 mL | INTRAVENOUS | Status: DC | PRN

## 2017-09-02 MED ORDER — VANCOMYCIN HCL IN DEXTROSE 1-5 GM/200ML-% IV SOLN
1000.0000 mg | Freq: Once | INTRAVENOUS | Status: AC
  Administered 2017-09-02: 1000 mg via INTRAVENOUS
  Filled 2017-09-02: qty 200

## 2017-09-02 MED ORDER — SENNA 8.6 MG PO TABS: 1.0000 | ORAL_TABLET | Freq: Two times a day (BID) | ORAL | Status: DC

## 2017-09-02 MED ORDER — DIPHENHYDRAMINE HCL 25 MG PO CAPS
25.0000 mg | ORAL_CAPSULE | Freq: Every evening | ORAL | Status: DC | PRN
  Administered 2017-09-03 – 2017-09-05 (×2): 25 mg via ORAL
  Filled 2017-09-02 (×2): qty 1

## 2017-09-02 MED ORDER — SODIUM CHLORIDE 0.9 % IV SOLN: 250.0000 mL | INTRAVENOUS | Status: DC

## 2017-09-02 MED ORDER — MORPHINE SULFATE (PF) 2 MG/ML IV SOLN
2.0000 mg | INTRAVENOUS | Status: DC | PRN
  Administered 2017-09-03: 2 mg via INTRAVENOUS
  Filled 2017-09-02: qty 1

## 2017-09-02 MED ORDER — METHOCARBAMOL 500 MG PO TABS
500.0000 mg | ORAL_TABLET | Freq: Three times a day (TID) | ORAL | Status: DC | PRN
  Administered 2017-09-02 – 2017-09-05 (×6): 500 mg via ORAL
  Filled 2017-09-02 (×6): qty 1

## 2017-09-02 MED ORDER — OXYCODONE-ACETAMINOPHEN 5-325 MG PO TABS: 1.0000 | ORAL_TABLET | ORAL | Status: DC | PRN

## 2017-09-02 MED ORDER — METHOCARBAMOL 1000 MG/10ML IJ SOLN
500.0000 mg | Freq: Four times a day (QID) | INTRAVENOUS | Status: DC | PRN
  Filled 2017-09-02: qty 5

## 2017-09-02 MED ORDER — POLYETHYLENE GLYCOL 3350 17 G PO PACK: 17.0000 g | PACK | Freq: Every day | ORAL | Status: DC | PRN

## 2017-09-02 MED ORDER — ONDANSETRON HCL 4 MG PO TABS: 4.0000 mg | ORAL_TABLET | Freq: Four times a day (QID) | ORAL | Status: DC | PRN

## 2017-09-02 MED ORDER — LACTATED RINGERS IV SOLN
INTRAVENOUS | Status: DC
  Administered 2017-09-02 – 2017-09-03 (×4): via INTRAVENOUS

## 2017-09-02 MED ORDER — HYDRALAZINE HCL 20 MG/ML IJ SOLN: 10.0000 mg | Freq: Four times a day (QID) | INTRAMUSCULAR | Status: DC | PRN

## 2017-09-02 MED ORDER — FLEET ENEMA 7-19 GM/118ML RE ENEM: 1.0000 | ENEMA | Freq: Once | RECTAL | Status: DC | PRN

## 2017-09-02 MED ORDER — MINERAL OIL RE ENEM: 1.0000 | ENEMA | Freq: Every day | RECTAL | Status: DC | PRN

## 2017-09-02 MED ORDER — HYDROCODONE-ACETAMINOPHEN 5-325 MG PO TABS
2.0000 | ORAL_TABLET | ORAL | Status: DC | PRN
  Administered 2017-09-02 – 2017-09-05 (×14): 2 via ORAL
  Filled 2017-09-02 (×14): qty 2

## 2017-09-02 MED ORDER — MENTHOL 3 MG MT LOZG: 1.0000 | LOZENGE | OROMUCOSAL | Status: DC | PRN

## 2017-09-02 MED ORDER — METHOCARBAMOL 1000 MG/10ML IJ SOLN: 500.0000 mg | Freq: Three times a day (TID) | INTRAVENOUS | Status: DC | PRN

## 2017-09-02 MED ORDER — METHOCARBAMOL 500 MG PO TABS: 500.0000 mg | ORAL_TABLET | Freq: Four times a day (QID) | ORAL | Status: DC | PRN

## 2017-09-02 MED ORDER — BISACODYL 10 MG RE SUPP: 10.0000 mg | Freq: Every day | RECTAL | Status: DC | PRN

## 2017-09-02 MED ORDER — SODIUM CHLORIDE 0.9% FLUSH
3.0000 mL | Freq: Two times a day (BID) | INTRAVENOUS | Status: DC
  Administered 2017-09-03 – 2017-09-05 (×4): 3 mL via INTRAVENOUS

## 2017-09-02 MED ORDER — ACETAMINOPHEN 650 MG RE SUPP: 650.0000 mg | RECTAL | Status: DC | PRN

## 2017-09-02 MED ORDER — PHENOL 1.4 % MT LIQD: 1.0000 | OROMUCOSAL | Status: DC | PRN

## 2017-09-02 MED ORDER — DOCUSATE SODIUM 100 MG PO CAPS: 100.0000 mg | ORAL_CAPSULE | Freq: Two times a day (BID) | ORAL | Status: DC

## 2017-09-02 MED ORDER — ACETAMINOPHEN 325 MG PO TABS
650.0000 mg | ORAL_TABLET | ORAL | Status: DC | PRN
  Administered 2017-09-04 – 2017-09-05 (×2): 650 mg via ORAL
  Filled 2017-09-02 (×2): qty 2

## 2017-09-02 MED ORDER — DIAZEPAM 5 MG/ML IJ SOLN
2.5000 mg | Freq: Once | INTRAMUSCULAR | Status: AC
  Administered 2017-09-02: 2.5 mg via INTRAVENOUS
  Filled 2017-09-02: qty 2

## 2017-09-02 MED ORDER — MORPHINE SULFATE (PF) 4 MG/ML IV SOLN: 4.0000 mg | INTRAVENOUS | Status: DC | PRN

## 2017-09-02 MED ORDER — ONDANSETRON HCL 4 MG/2ML IJ SOLN: 4.0000 mg | Freq: Four times a day (QID) | INTRAMUSCULAR | Status: DC | PRN

## 2017-09-02 MED ORDER — ALUM & MAG HYDROXIDE-SIMETH 200-200-20 MG/5ML PO SUSP: 30.0000 mL | Freq: Four times a day (QID) | ORAL | Status: DC | PRN

## 2017-09-02 NOTE — Clinical Social Work Note (Signed)
Clinical Social Work Assessment  Patient Details  Name: [name] MRN: [id number] Date of Birth: 02-14-1933  Date of referral:  09/02/17               Reason for consult:  Facility Placement                Permission sought to share information with:  Family Supports Permission granted to share information::  Yes, Verbal Permission Granted  Name::     Brittni Hult   Agency::  family   Relationship::  spouse   Contact Information:  Al[place] Copus [phone]  Housing/Transportation Living arrangements for the past 2 months:  Single Family Home(with husband. ) Source of Information:  Patient Patient Interpreter Needed:  None Criminal Activity/Legal Involvement Pertinent to Current Situation/Hospitalization:  No - Comment as needed Significant Relationships:  Adult Children, Spouse Lives with:  Spouse Do you feel safe going back to the place where you live?  Yes Need for family participation in patient care:  Yes (Comment)  Care giving concerns: CSW spoke with pt at bedside. At this time pt denies having any concerns to CSW.    Social Worker assessment / plan:  CSW spoke with pt at bedside. During this time CSW was informed that pt is from home with husband. Pt expressed that pt does have support from husband and a son that lives near the [place]. Pt verbalized an interest in SNF as pt understands that husband is unable to care for pt and self. Pt is interested [facility] and [facility].   During this time CSW observed pt in bed. Pt was pleasant and was agreeable to having conversation with CSW. CSW left  SNF list in pt's room as requested by pt to see ratings of SNF's.   Employment status:  Retired Nurse, adult PT Recommendations:  [place] / Referral to community resources:  [facility](spoke with pt about SNF placement options. )  Patient/Family's Response to care:  Pt's response to care was  understanding and agreeable at this time.   Patient/Family's Understanding of and Emotional Response to Diagnosis, Current Treatment, and Prognosis:  No further questions or concerns have been presented to CSW at this time. Emotional response was positive and realistic about goals of care.   Emotional Assessment Appearance:  Appears stated age Attitude/Demeanor/Rapport:  Gracious, Engaged Affect (typically observed):  Calm, Appropriate, Pleasant Orientation:  Oriented to Self, Oriented to Place, Oriented to Situation, Oriented to  Time Alcohol / Substance use:  Not Applicable Psych involvement (Current and /or in the community):  No (Comment)  Discharge Needs  Concerns to be addressed:  Denies Needs/Concerns at this time Readmission within the last 30 days:  No Current discharge risk:  Dependent with Mobility Barriers to Discharge:  Continued Medical Work up   [employer], [place] 09/02/2017, 9:21 AM

## 2017-09-02 NOTE — Progress Notes (Signed)
Subjective: Patient resting in bed, comfortable.  Labs look good this morning.  Objective: Vital signs in last 24 hours: Vitals:   09/02/17 0300 09/02/17 0400 09/02/17 0500 09/02/17 0600  BP: 136/82 104/70 99/68 125/69  Pulse: 93 95 95 92  Resp: 12 13 (!) 24 13  Temp:  99 F (37.2 C)    TempSrc:  Oral    SpO2: 99% 99% 99% 98%  Weight:      Height:        Intake/Output from previous day: 05/31 0701 - 06/01 0700 In: 2956 [I.V.:4990; Blood:1714] Out: 2660 [Urine:760; Blood:1900] Intake/Output this shift: No intake/output data recorded.  Physical Exam: Good strength in distal lower extremities, 5/5 dorsiflexor and plantar flexor.  Dressing clean and dry.  CBC Recent Labs    08/31/17 0958  09/01/17 2156 09/02/17 0600  WBC 4.0  --   --  5.2  HGB 8.7*   < > 9.5* 10.7*  HCT 29.2*   < > 28.0* 32.4*  PLT 396  --   --  292   < > = values in this interval not displayed.   BMET Recent Labs    08/31/17 0958  09/01/17 2156 09/02/17 0600  NA 137   < > 137 135  K 3.5   < > 3.7 3.8  CL 103  --   --  102  CO2 26  --   --  26  GLUCOSE 104*  --  112* 178*  BUN 9  --   --  7  CREATININE 0.60  --   --  0.61  CALCIUM 9.2  --   --  8.6*   < > = values in this interval not displayed.   ABG    Component Value Date/Time   PHART 7.394 09/01/2017 2100   PCO2ART 48.5 (H) 09/01/2017 2100   PO2ART 505.0 (H) 09/01/2017 2100   HCO3 30.0 (H) 09/01/2017 2100   TCO2 32 09/01/2017 2100   O2SAT 100.0 09/01/2017 2100    Studies/Results: Dg Lumbar Spine 2-3 Views  Result Date: 09/02/2017 CLINICAL DATA:  Revision of lumbar spinal fusion. EXAM: LUMBAR SPINE - 2-3 VIEW COMPARISON:  CT of the lumbar spine performed 08/31/2017 FINDINGS: Four fluoroscopic C-arm images are provided from the OR, demonstrating new fusion of the sacroiliac joints and placement of thoracolumbar spinal fusion rods. Underlying decompression is noted. IMPRESSION: Status post fusion of the sacroiliac joints and placement  of thoracolumbar spinal fusion rods. Electronically Signed   By: Garald Balding M.D.   On: 09/02/2017 00:14   Ct Lumbar Spine W Contrast  Result Date: 08/31/2017 CLINICAL DATA:  Closed fracture fifth lumbar vertebra. Lumbar fusion. Severe back pain EXAM: LUMBAR MYELOGRAM PROCEDURE: Lumbar puncture and intrathecal contrast administration were performed by Dr. Ellene Route Who will separately report for the portion of the procedure. I personally supervised acquisition of the myelogram images. TECHNIQUE: Contiguous axial images were obtained through the Lumbar spine after the intrathecal infusion of infusion. Coronal and sagittal reconstructions were obtained of the axial image sets. COMPARISON:  CT lumbar 08/14/2017 FINDINGS: LUMBAR MYELOGRAM FINDINGS: The patient was in extreme pain. Limited ability to position the patient. Lateral images were obtained with limited AP images with the patient supine. Pedicle screw and posterior rod fusion extending from T10 through L5. Vertebral body cement T10, T11, T12, L1. Interbody spacers at L2-3, L3-4, L4-5. Fracture of the superior and posterior margin of the L2 vertebral body as noted on the prior CT. 1 cm retrolisthesis L1 relative  to L2 is also unchanged. Mild spinal stenosis at L1-2. Fractures of L5 are best seen on CT. No other level of stenosis. Conus medullaris normal. Lower thoracic spinal canal normal. CT LUMBAR MYELOGRAM FINDINGS: Pedicle screw and rod fusion extending T10 through L5 as noted previously. Vertebral body cement T10, T11, T12, L1 in good position bilaterally. Thoracic cord and conus medullaris normal and morphology. Atherosclerotic aorta. Fluid collection in the soft tissues posterior to L3 and L4 measuring 4.5 x 5 cm unchanged from the prior study most likely postoperative fluid. No contrast in the fluid collection. T8-9: Negative T9-10: Mild disc bulging. Small right-sided disc protrusion with spurring. Negative for stenosis T10-11: Bilateral facet joint  effusion and mild disc degeneration without significant stenosis T11-12: Mild disc and facet degeneration.  Negative for stenosis. T12-L1: Mild facet degeneration without stenosis L1-2: Displaced fracture of the posterosuperior L2 vertebral body unchanged from the prior CT. 11 mm retrolisthesis L1-2 also unchanged. Pedicle screws at L2 have been removed. Pedicle screw at L3 crosses the lateral recess unchanged. Negative for contrast leak. Mild stenosis at L1-2. L2-3: Pedicle screw and interbody fusion. Negative for stenosis or contrast leak L3-4: Pedicle screw and interbody fusion. Negative for stenosis or contrast leak L4-5: Pedicle screw and interbody fusion without stenosis L5-S1: Bilateral posterior element fractures of L5 involving the right pedicle and pars interarticularis on the left. 9 mm anterolisthesis L5-S1 on the left has progressed since the prior study. Progressive fracture distraction on the left compared to the prior study. Streaky density in the piriformis muscle bilaterally right greater than left which tracks back to the sacral foramina most likely contrast injection from the myelogram. This was not present on the prior study. IMPRESSION: LUMBAR MYELOGRAM IMPRESSION: Patient was in severe pain.  Limited lumbar myelogram CT LUMBAR MYELOGRAM IMPRESSION: Pedicle screw fusion T10 through L5 as noted previously. Right L3 screw extends through the lateral recess unchanged. Fracture of L2 with 11 mm retrolisthesis at L2-3 and mild spinal stenosis unchanged from the prior study. Bilateral posterior element fractures of L5. Progressive anterolisthesis L5-S1 on the left due to distraction of the pars intra-articular fracture on the left. Streaky density in the piriformis muscle bilaterally appears to be related to contrast injection from the myelogram. Electronically Signed   By: Franchot Gallo M.D.   On: 08/31/2017 10:57   Dg Myelogram Lumbar  Result Date: 08/31/2017 CLINICAL DATA:  Closed fracture  fifth lumbar vertebra. Lumbar fusion. Severe back pain EXAM: LUMBAR MYELOGRAM PROCEDURE: Lumbar puncture and intrathecal contrast administration were performed by Dr. Ellene Route Who will separately report for the portion of the procedure. I personally supervised acquisition of the myelogram images. TECHNIQUE: Contiguous axial images were obtained through the Lumbar spine after the intrathecal infusion of infusion. Coronal and sagittal reconstructions were obtained of the axial image sets. COMPARISON:  CT lumbar 08/14/2017 FINDINGS: LUMBAR MYELOGRAM FINDINGS: The patient was in extreme pain. Limited ability to position the patient. Lateral images were obtained with limited AP images with the patient supine. Pedicle screw and posterior rod fusion extending from T10 through L5. Vertebral body cement T10, T11, T12, L1. Interbody spacers at L2-3, L3-4, L4-5. Fracture of the superior and posterior margin of the L2 vertebral body as noted on the prior CT. 1 cm retrolisthesis L1 relative to L2 is also unchanged. Mild spinal stenosis at L1-2. Fractures of L5 are best seen on CT. No other level of stenosis. Conus medullaris normal. Lower thoracic spinal canal normal. CT LUMBAR MYELOGRAM FINDINGS: Pedicle screw  and rod fusion extending T10 through L5 as noted previously. Vertebral body cement T10, T11, T12, L1 in good position bilaterally. Thoracic cord and conus medullaris normal and morphology. Atherosclerotic aorta. Fluid collection in the soft tissues posterior to L3 and L4 measuring 4.5 x 5 cm unchanged from the prior study most likely postoperative fluid. No contrast in the fluid collection. T8-9: Negative T9-10: Mild disc bulging. Small right-sided disc protrusion with spurring. Negative for stenosis T10-11: Bilateral facet joint effusion and mild disc degeneration without significant stenosis T11-12: Mild disc and facet degeneration.  Negative for stenosis. T12-L1: Mild facet degeneration without stenosis L1-2: Displaced  fracture of the posterosuperior L2 vertebral body unchanged from the prior CT. 11 mm retrolisthesis L1-2 also unchanged. Pedicle screws at L2 have been removed. Pedicle screw at L3 crosses the lateral recess unchanged. Negative for contrast leak. Mild stenosis at L1-2. L2-3: Pedicle screw and interbody fusion. Negative for stenosis or contrast leak L3-4: Pedicle screw and interbody fusion. Negative for stenosis or contrast leak L4-5: Pedicle screw and interbody fusion without stenosis L5-S1: Bilateral posterior element fractures of L5 involving the right pedicle and pars interarticularis on the left. 9 mm anterolisthesis L5-S1 on the left has progressed since the prior study. Progressive fracture distraction on the left compared to the prior study. Streaky density in the piriformis muscle bilaterally right greater than left which tracks back to the sacral foramina most likely contrast injection from the myelogram. This was not present on the prior study. IMPRESSION: LUMBAR MYELOGRAM IMPRESSION: Patient was in severe pain.  Limited lumbar myelogram CT LUMBAR MYELOGRAM IMPRESSION: Pedicle screw fusion T10 through L5 as noted previously. Right L3 screw extends through the lateral recess unchanged. Fracture of L2 with 11 mm retrolisthesis at L2-3 and mild spinal stenosis unchanged from the prior study. Bilateral posterior element fractures of L5. Progressive anterolisthesis L5-S1 on the left due to distraction of the pars intra-articular fracture on the left. Streaky density in the piriformis muscle bilaterally appears to be related to contrast injection from the myelogram. Electronically Signed   By: Franchot Gallo M.D.   On: 08/31/2017 10:57   Dg C-arm 1-60 Min  Result Date: 09/02/2017 CLINICAL DATA:  Revision of lumbar spinal fusion. EXAM: LUMBAR SPINE - 2-3 VIEW COMPARISON:  CT of the lumbar spine performed 08/31/2017 FINDINGS: Four fluoroscopic C-arm images are provided from the OR, demonstrating new fusion of  the sacroiliac joints and placement of thoracolumbar spinal fusion rods. Underlying decompression is noted. IMPRESSION: Status post fusion of the sacroiliac joints and placement of thoracolumbar spinal fusion rods. Electronically Signed   By: Garald Balding M.D.   On: 09/02/2017 00:14   Dg C-arm 1-60 Min  Result Date: 09/02/2017 CLINICAL DATA:  Revision of lumbar spinal fusion. EXAM: LUMBAR SPINE - 2-3 VIEW COMPARISON:  CT of the lumbar spine performed 08/31/2017 FINDINGS: Four fluoroscopic C-arm images are provided from the OR, demonstrating new fusion of the sacroiliac joints and placement of thoracolumbar spinal fusion rods. Underlying decompression is noted. IMPRESSION: Status post fusion of the sacroiliac joints and placement of thoracolumbar spinal fusion rods. Electronically Signed   By: Garald Balding M.D.   On: 09/02/2017 00:14    Assessment/Plan: Doing well following surgery.  Spoke with nurse about mobilizing this morning and advancing diet.  Awaiting PT and OT.   Hosie Spangle, MD 09/02/2017, 8:09 AM

## 2017-09-02 NOTE — Progress Notes (Signed)
Patient assisted back to bed from chair, where she had sat for almost 2 hours.  Tol fair with use of IV Valium.  She is very animated and has left buttock to ankle pain she describes as "stitches".  On further eval, it was plan that she was describing spasms.  MDs Elsner and Sherwood Gambler aware and 1 time dose of valium ordered with increased tolerance to movement.  Need to constantly cue patient when having increased anxiety with spasms.  The anticipation of the spasms (which she describes as not as bad as pre-op) appear to be one of the factors which increases the pain.  Needs emotional and physical support from staff and constant cueing when mobilizing.

## 2017-09-02 NOTE — Evaluation (Signed)
Physical Therapy Evaluation Patient Details Name: [name] MRN: [id number] DOB: 1933-01-22 Today's Date: 09/02/2017   History of Present Illness  pt is an 84 y/o female with recent lumbar fusion surgery in March with revision in April for L2 failure.  Now admitted with progressively worsening severe buttock and left LE pain, CT showing L5 fracture with loss of fixation.  pt s/p fixation from T10 to the ileum, decompression of L5 root, and arthrodesis at L5/S1.  Clinical Impression  Pt admitted with/for progressive pain and revision of fusion.  Pt needing mod to max of 1 to 2 persons for mobility.  Pt currently limited functionally due to the problems listed. ( See problems list.)   Pt will benefit from PT to maximize function and safety in order to get ready for next venue listed below.     Follow Up Recommendations SNF;Supervision/Assistance - 24 hour    Equipment Recommendations  None recommended by PT    Recommendations for Other Services       Precautions / Restrictions Precautions Precautions: Fall;Back Required Braces or Orthoses: Spinal Brace Spinal Brace: Lumbar corset;Applied in sitting position      Mobility  Bed Mobility Overal bed mobility: Needs Assistance Bed Mobility: Rolling;Sidelying to Sit Rolling: Max assist Sidelying to sit: Max assist       General bed mobility comments: assist for roll, legs off bed and truncal assist up.  All very painful.  Transfers Overall transfer level: Needs assistance Equipment used: Rolling walker (2 wheeled) Transfers: Sit to/from Stand Sit to Stand: Max assist;+2 safety/equipment         General transfer comment: cues for hand placement and stability assist during transition.  Ambulation/Gait Ambulation/Gait assistance: Mod assist;+2 physical assistance Ambulation Distance (Feet): 3 Feet(forw/back and 6 feet in a pivotal arch to the chair.) Assistive device: Rolling walker (2 wheeled) Gait  Pattern/deviations: Step-to pattern;Decreased step length - right;Decreased step length - left;Decreased stride length   Gait velocity interpretation: <1.31 ft/sec, indicative of household ambulator General Gait Details: short stiff steps with shoulders high and heavy use of the RW  Stairs            Wheelchair Mobility    Modified Rankin (Stroke Patients Only)       Balance Overall balance assessment: Needs assistance Sitting-balance support: Bilateral upper extremity supported Sitting balance-Leahy Scale: Poor Sitting balance - Comments: reliant on both UE's due to pain.  Unable to assist donning the brace.   Standing balance support: Bilateral upper extremity supported Standing balance-Leahy Scale: Poor Standing balance comment: reliant on the RW                             Pertinent Vitals/Pain Pain Assessment: Faces Faces Pain Scale: Hurts whole lot Pain Location: left leg down to knee and foot Pain Descriptors / Indicators: Burning;Grimacing;Guarding;Radiating;Shooting;Spasm Pain Intervention(s): Limited activity within patient's tolerance;Monitored during session;Premedicated before session;Repositioned    Home Living Family/patient expects to be discharged to:: Skilled nursing facility Living Arrangements: Spouse/significant other Available Help at Discharge: Family Type of Home: House Home Access: Stairs to enter Entrance Stairs-Rails: Right Entrance Stairs-Number of Steps: 2 Home Layout: Multi-level Home Equipment: Cane - single point;Tub bench;Walker - 2 wheels;Bedside commode;Adaptive equipment      Prior Function Level of Independence: Needs assistance   Gait / Transfers Assistance Needed: recently has only ambulated short distances at best.           Hand Dominance  Dominant Hand: Right    Extremity/Trunk Assessment        Lower Extremity Assessment Lower Extremity Assessment: Generalized weakness;LLE deficits/detail LLE:  Unable to fully assess due to pain LLE Coordination: decreased fine motor       Communication   Communication: No difficulties  Cognition Arousal/Alertness: Awake/alert Behavior During Therapy: WFL for tasks assessed/performed Overall Cognitive Status: Within Functional Limits for tasks assessed                                        General Comments      Exercises     Assessment/Plan    PT Assessment Patient needs continued PT services  PT Problem List Decreased strength;Decreased activity tolerance;Decreased balance;Decreased mobility;Decreased knowledge of use of DME;Decreased knowledge of precautions;Pain       PT Treatment Interventions Gait training;DME instruction;Functional mobility training;Therapeutic activities;Balance training;Patient/family education    PT Goals (Current goals can be found in the Care Plan section)  Acute Rehab PT Goals Patient Stated Goal: Get back to being active PT Goal Formulation: With patient Time For Goal Achievement: 09/16/17 Potential to Achieve Goals: Fair    Frequency Min 5X/week   Barriers to discharge        Co-evaluation               AM-PAC PT "6 Clicks" Daily Activity  Outcome Measure Difficulty turning over in bed (including adjusting bedclothes, sheets and blankets)?: Unable Difficulty moving from lying on back to sitting on the side of the bed? : Unable Difficulty sitting down on and standing up from a chair with arms (e.g., wheelchair, bedside commode, etc,.)?: Unable Help needed moving to and from a bed to chair (including a wheelchair)?: A Lot Help needed walking in hospital room?: A Lot Help needed climbing 3-5 steps with a railing? : Total 6 Click Score: 8    End of Session   Activity Tolerance: Patient limited by pain Patient left: in chair;with call bell/phone within reach;with nursing/sitter in room Nurse Communication: Mobility status PT Visit Diagnosis: Other abnormalities of  gait and mobility (R26.89);Other symptoms and signs involving the nervous system (R29.898);Pain Pain - Right/Left: Left Pain - part of body: Knee(leg knee hip)    Time: 8250-5397 PT Time Calculation (min) (ACUTE ONLY): 32 min   Charges:   PT Evaluation $PT Eval Moderate Complexity: 1 Mod PT Treatments $Therapeutic Activity: 8-22 mins   PT G Codes:        09-30-17  [name], PT [phone]  (pager)  Tessie Fass Jeni Duling 09-30-17, 5:11 PM

## 2017-09-02 NOTE — Progress Notes (Signed)
Is without unusual complaints this morning.  Hemoglobin has remained stable through the wee hours of the morning in fact is slightly elevated this morning at 10.7.  She has no complaints of weakness in the lower extremities. On exam her pulse is 92 her blood pressure 125/69 her temperature is 99 degrees.  He is entirely appropriately interactive she has 5 out of 5 dorsiflexion and plantarflexion bilaterally.  There is no evidence of ongoing bleeding.  The critical care service will not routinely follow please reconsult [place] if needed

## 2017-09-02 NOTE — Consult Note (Signed)
Name: [name] MRN: [id number] DOB: 10-02-1932    ADMISSION DATE:  08/31/2017 CONSULTATION DATE:  5/31  REFERRING MD :  Dr. Ellene Route  CHIEF COMPLAINT:  L5 fracture  HISTORY OF PRESENT ILLNESS:   84 year old female with PMH significant for degenerative arthritis, GERD, hypothyroidism, myasthenia gravis, and osteoarthritis admitted for elective surgery to [place].  Patient underwent surgical decompression on L2-L5 on 06/13/2017. She then developed bilateral pedicle fractures at L2 requiring revision 07/17/2017.  Of note, she was found to have acute DVT of R peroneal veins and placed on Eliquis on 5/2.    She then developed severe back pain and left leg pain several weeks ago found to have fracture of L5.  Recent myelogram demonstrated compression of left L5 nerve root and loss of fixation.  Therefore patient underwent planned revision on 5/31.   EBL for procedure estimated at 1930ml and thus 4 units of pack cells infused.  She was extubated and hemodynamically stable post-operatively.  NSGY requesting medical care in ICU.    PAST MEDICAL HISTORY :   has a past medical history of Arthritis, GERD (gastroesophageal reflux disease), History of blood transfusion, Hypothyroidism, PONV (postoperative nausea and vomiting), Ptosis of both eyelids, Vertigo, and Wears glasses.  has a past surgical history that includes Knee surgery ([phone]); Shoulder surgery; Tonsillectomy; Abdominal hysterectomy (1986); Carpal tunnel release (2000); Carpal tunnel release (2012); Cystocele repair (2007); Colonoscopy; Open reduction internal fixation (orif) proximal phalanx (Left, 08/30/2013); Distal interphalangeal joint fusion (Right, 02/04/2014); Ptosis repair (Bilateral, 07/27/2015); Back surgery (1986); Eye surgery (Bilateral); Posterior lumbar fusion 4 level (N/A, 07/01/5186); and Application of robotic assistance for spinal procedure (N/A, 07/17/2017).   Prior to Admission medications   Medication Sig Start Date  End Date Taking? Authorizing Provider  diphenhydramine-acetaminophen (TYLENOL PM) 25-500 MG TABS tablet Take 2 tablets by mouth at bedtime.    Yes [provider]  HYDROcodone-acetaminophen (NORCO/VICODIN) 5-325 MG tablet Take 1-2 tablets by mouth every 4 (four) hours as needed for moderate pain ((score 4 to 6)). Patient taking differently: Take 1 tablet by mouth every evening. May take a second dose during the day as needed for pain 07/20/17  Yes Elsner, Mallie Mussel, MD  levothyroxine (SYNTHROID, LEVOTHROID) 50 MCG tablet Take 50 mcg by mouth daily.   Yes [provider]  meloxicam (MOBIC) 7.5 MG tablet Take 7.5 mg by mouth daily.   Yes [provider]  omeprazole (PRILOSEC) 20 MG capsule Take 20 mg by mouth daily.   Yes [provider]  tiZANidine (ZANAFLEX) 4 MG capsule Take 4 mg by mouth 3 (three) times daily as needed for muscle spasms.   Yes [provider]  bumetanide (BUMEX) 0.5 MG tablet Take 0.5 mg by mouth daily as needed (swelling).    [provider]  ELIQUIS 2.5 MG TABS tablet Take 2.5 mg by mouth 2 (two) times daily. 08/03/17   [provider]  meclizine (ANTIVERT) 25 MG tablet Take 25 mg by mouth 3 (three) times daily as needed for dizziness.    [provider]  methocarbamol (ROBAXIN) 500 MG tablet Take 1 tablet (500 mg total) by mouth every 6 (six) hours as needed for muscle spasms. Patient not taking: Reported on 08/25/2017 07/20/17   Kristeen Miss, MD   Allergies  Allergen Reactions  . Keflex [Cephalexin] Hives    FAMILY HISTORY:  family history is not on file. SOCIAL HISTORY:  reports that she has quit smoking. She has never used smokeless tobacco. She  reports that she does not drink alcohol or use drugs.  REVIEW OF SYSTEMS:  POSITIVES IN BOLD  Gen: Denies fever, chills, weight change, fatigue, night sweats HEENT: Denies vision changes, sinus congestion, rhinorrhea, sore throat, dysphagia PULM: Denies  shortness of breath, cough, sputum production CV: Denies chest pain, edema, orthopnea, palpitations GI: Denies abdominal pain, nausea, vomiting, diarrhea, change in bowel habits GU: Denies dysuria, hematuria, polyuria, oliguria, urethral discharge Endocrine: Denies polyuria, appetite change Derm: Denies rash, or skin change Heme: Denies easy bruising, bleeding, bleeding gums Neuro: Denies headache, numbness, weakness, slurred speech, loss of memory or consciousness, back pain, leg pain  SUBJECTIVE:  Patient c/o of back soreness.  No other complaints.  VITAL SIGNS: Temp:  [97.5 F (36.4 C)-98.1 F (36.7 C)] 97.5 F (36.4 C) (05/31 2345) Pulse Rate:  [67-100] 100 (06/01 0000) Resp:  [16-23] 16 (06/01 0000) BP: (136-180)/(66-94) 136/94 (06/01 0000) SpO2:  [93 %-98 %] 98 % (06/01 0000) Arterial Line BP: (207)/(78) 207/78 (06/01 0000)  PHYSICAL EXAMINATION: General:  Elderly female lying supine in bed in NAD HEENT: MM pink/moist, pupils 3/ reactive Neuro: Sleepy, but oriented x 3, MAE, no altered sensation on exam, back dressing c/d/i CV: RR,  no m/r/g, 2+ pulses PULM: even/non-labored, lungs bilaterally clear, on 2 L [place]  GI: soft, non-tender, bs active, foley Extremities: warm/dry, no edema Skin: no rashes    Recent Labs  Lab 08/31/17 0958 09/01/17 2100 09/01/17 2156  NA 137 136 137  K 3.5 3.6 3.7  CL 103  --   --   CO2 26  --   --   BUN 9  --   --   CREATININE 0.60  --   --   GLUCOSE 104*  --  112*   Recent Labs  Lab 08/31/17 0958 09/01/17 2100 09/01/17 2156  HGB 8.7* 8.8* 9.5*  HCT 29.2* 26.0* 28.0*  WBC 4.0  --   --   PLT 396  --   --    SIGNIFICANT EVENTS  5/31 Admit for revision back surgery  STUDIES:  5/30 CT Lumbar spine w/contrast / lumbar myelogram >> Pedicle screw fusion T10 through L5 as noted previously. Right L3 screw extends through the lateral recess unchanged.  Fracture of L2 with 11 mm retrolisthesis at L2-3 and mild spinal stenosis  unchanged from the prior study.  Bilateral posterior element fractures of L5. Progressive anterolisthesis L5-S1 on the left due to distraction of the pars intra-articular fracture on the left.  Streaky density in the piriformis muscle bilaterally appears to be related to contrast injection from the myelogram   BRIEF PATIENT DESCRIPTION:  65 F revision back surgery for fx vertebrae.  4 units pRBC following 1900 cc blood loss.  Dr. Ellene Route requesting assistance with medical management.  Hemodynamically stable and on 2L [place].  ASSESSMENT / PLAN:  L5 fracture with loss of fixation and left lumbar radiculopathy s/p revision P:  NSGY primary Monitor hemodynamics in ICU overnight Wean supplemental O2 for sats > 92% Pulmonary hygiene with IS Defer mobility to NSGY BMET this am Reduce LR to 75 ml/hr, as patient has good UOP and slighly hypertensive PRN apresoline for SBP < 180 Pain management per NSGY with toradol, tylenol, vicodin, morphine, and robaxin/ zanaflex prn  Bowel regimen with colace and dulcolax prn   ABLA - EBL 1900 ml, s/p 4 units of pack cells  - Hgb 9.5 as of 2200 on 5/31P:  P:  CBC in am  Transfuse for Hgb < 7  Continue Aline, if remains hemodynamically stable, d/c in am  GERD P:  PPI daily   RLE DVT (5/2) on Eliquis prior to surgery  P:  Lovenox per NSGY  Hypothyroidism P:  Resume synthroid 50 mcg daily   Updates:  Patient updated on plan of care.  No family at bedside.   Kennieth Rad, AGACNP-BC [facility] Pgr: [phone] or if no answer [phone] 09/02/2017, 1:17 AM

## 2017-09-03 LAB — CBC WITH DIFFERENTIAL/PLATELET
Abs Immature Granulocytes: 0 10*3/uL (ref 0.0–0.1)
Basophils Absolute: 0 10*3/uL (ref 0.0–0.1)
Basophils Relative: 0 %
Eosinophils Absolute: 0.3 10*3/uL (ref 0.0–0.7)
Eosinophils Relative: 5 %
HCT: 26.3 % — ABNORMAL LOW (ref 36.0–46.0)
Hemoglobin: 8.3 g/dL — ABNORMAL LOW (ref 12.0–15.0)
Immature Granulocytes: 0 %
Lymphocytes Relative: 28 %
Lymphs Abs: 1.6 10*3/uL (ref 0.7–4.0)
MCH: 27.3 pg (ref 26.0–34.0)
MCHC: 31.6 g/dL (ref 30.0–36.0)
MCV: 86.5 fL (ref 78.0–100.0)
Monocytes Absolute: 0.8 10*3/uL (ref 0.1–1.0)
Monocytes Relative: 14 %
Neutro Abs: 3.1 10*3/uL (ref 1.7–7.7)
Neutrophils Relative %: 53 %
Platelets: 268 10*3/uL (ref 150–400)
RBC: 3.04 MIL/uL — ABNORMAL LOW (ref 3.87–5.11)
RDW: 14.1 % (ref 11.5–15.5)
WBC: 5.8 10*3/uL (ref 4.0–10.5)

## 2017-09-03 MED ORDER — DIAZEPAM 5 MG PO TABS
5.0000 mg | ORAL_TABLET | Freq: Once | ORAL | Status: AC
  Administered 2017-09-03: 5 mg via ORAL
  Filled 2017-09-03: qty 1

## 2017-09-03 MED ORDER — GABAPENTIN 300 MG PO CAPS
300.0000 mg | ORAL_CAPSULE | Freq: Two times a day (BID) | ORAL | Status: DC
  Administered 2017-09-03 – 2017-09-05 (×5): 300 mg via ORAL
  Filled 2017-09-03 (×5): qty 1

## 2017-09-03 NOTE — Progress Notes (Signed)
Vitals:   09/02/17 2300 09/03/17 0042 09/03/17 0300 09/03/17 0747  BP: 132/60 137/64 (!) 127/55 129/64  Pulse: 79 71 70 73  Resp: 16 18 18 15   Temp:  98.4 F (36.9 C) 97.7 F (36.5 C) 97.6 F (36.4 C)  TempSrc:  Oral Oral Oral  SpO2: 96% 96% 96% 95%  Weight:      Height:        CBC Recent Labs    09/01/17 2156 09/02/17 0600  WBC  --  5.2  HGB 9.5* 10.7*  HCT 28.0* 32.4*  PLT  --  292   BMET Recent Labs    09/01/17 2156 09/02/17 0600  NA 137 135  K 3.7 3.8  CL  --  102  CO2  --  26  GLUCOSE 112* 178*  BUN  --  7  CREATININE  --  0.61  CALCIUM  --  8.6*    Patient complaining of pain in the left ankle.  Had significant drainage into honeycomb dressing, asked nursing staff to reinforce, and change reinforcement as needed.  Has been out of bed to the chair, continuing PT, awaiting OT.  Plan: We will add gabapentin 300 mg twice daily for neuropathic radicular pain.  Continuing therapies.  Hosie Spangle, MD 09/03/2017, 10:14 AM

## 2017-09-03 NOTE — Progress Notes (Signed)
Physical Therapy Treatment Patient Details Name: [name] MRN: [id number] DOB: [birth year] Today's Date: 09/03/2017    History of Present Illness pt is an 84 y/o female with recent lumbar fusion surgery in March with revision in April for L2 failure.  Now admitted with progressively worsening severe buttock and left LE pain, CT showing L5 fracture with loss of fixation.  pt s/p fixation from T10 to the ileum, decompression of L5 root, and arthrodesis at L5/S1.    PT Comments    Pt received in bed. She reports just returning to bed from [facility] and needed encouragement for participation in therapy. Mod assist needed for bed mobility, +2 mod assist sit to stand, and +2 min assist ambulation 3 feet with RW bed to RW. Pt pleased with her ability to clear L foot from floor while walking. She reports having to drag it earlier this morning. Pt positioned in chair at end of session. Discharge recommendations remain appropriate.    Follow Up Recommendations  SNF;Supervision/Assistance - 24 hour     Equipment Recommendations  None recommended by PT    Recommendations for Other Services       Precautions / Restrictions Precautions Precautions: Fall;Back Precaution Comments: Reviewed 3/3 back precautions. Required Braces or Orthoses: Spinal Brace Spinal Brace: Lumbar corset;Applied in sitting position    Mobility  Bed Mobility Overal bed mobility: Needs Assistance Bed Mobility: Rolling;Sidelying to Sit Rolling: Mod assist Sidelying to sit: Mod assist;HOB elevated       General bed mobility comments: +rail, cues for logroll. Assist to elevate trunk.  Transfers Overall transfer level: Needs assistance Equipment used: Rolling walker (2 wheeled) Transfers: Sit to/from Stand Sit to Stand: Mod assist;+2 physical assistance Stand pivot transfers: Min assist;+2 physical assistance       General transfer comment: cues for hand placement and sequencing. Pt able to take pivot steps  toward right bed to recliner  Ambulation/Gait Ambulation/Gait assistance: Min assist;+2 physical assistance Ambulation Distance (Feet): 3 Feet Assistive device: Rolling walker (2 wheeled) Gait Pattern/deviations: Step-to pattern;Decreased stance time - left;Trunk flexed Gait velocity: decreased Gait velocity interpretation: <1.31 ft/sec, indicative of household ambulator General Gait Details: Pt able to clear left/right foot in swing phase. She reports dragging L foot earlier today.   Stairs             Wheelchair Mobility    Modified Rankin (Stroke Patients Only)       Balance Overall balance assessment: Needs assistance Sitting-balance support: Feet supported;Bilateral upper extremity supported Sitting balance-Leahy Scale: Fair     Standing balance support: Bilateral upper extremity supported;During functional activity Standing balance-Leahy Scale: Poor Standing balance comment: reliant on the RW and external support                            Cognition Arousal/Alertness: Awake/alert Behavior During Therapy: WFL for tasks assessed/performed Overall Cognitive Status: Within Functional Limits for tasks assessed                                        Exercises      General Comments        Pertinent Vitals/Pain Pain Assessment: Faces Faces Pain Scale: Hurts even more Pain Location: LLE Pain Descriptors / Indicators: Sore;Guarding;Grimacing Pain Intervention(s): Monitored during session;Limited activity within patient's tolerance;Repositioned;Premedicated before session    Home Living  Prior Function            PT Goals (current goals can now be found in the care plan section) Acute Rehab PT Goals Patient Stated Goal: Get back to being active PT Goal Formulation: With patient Time For Goal Achievement: 09/16/17 Potential to Achieve Goals: Fair Progress towards PT goals: Progressing toward  goals    Frequency    Min 5X/week      PT Plan Current plan remains appropriate    Co-evaluation              AM-PAC PT "6 Clicks" Daily Activity  Outcome Measure  Difficulty turning over in bed (including adjusting bedclothes, sheets and blankets)?: Unable Difficulty moving from lying on back to sitting on the side of the bed? : Unable Difficulty sitting down on and standing up from a chair with arms (e.g., wheelchair, bedside commode, etc,.)?: Unable Help needed moving to and from a bed to chair (including a wheelchair)?: A Lot Help needed walking in hospital room?: A Lot Help needed climbing 3-5 steps with a railing? : Total 6 Click Score: 8    End of Session Equipment Utilized During Treatment: Gait belt;Back brace Activity Tolerance: Patient tolerated treatment well Patient left: in chair;with call bell/phone within reach Nurse Communication: Mobility status PT Visit Diagnosis: Other abnormalities of gait and mobility (R26.89);Other symptoms and signs involving the nervous system (R29.898);Pain     Time: 1006-1017 PT Time Calculation (min) (ACUTE ONLY): 11 min  Charges:  $Therapeutic Activity: 8-22 mins                    G Codes:       Lorrin Goodell, PT  Office # [phone] Pager [phone]    Lorriane Shire 09/03/2017, 12:13 PM

## 2017-09-03 NOTE — Progress Notes (Signed)
2358- patient transferred to 4NP07 from 4NICU. When turned, RN noted honeycomb to back was completely saturated and the bed pad was soaked in a pool of blood approx. 14 inches in diameter. Patient also c/o left leg spasms. MD notified, received orders to reinforce dressing and for one time dose of valium 5mg  PO.  0200- patient verbalized no relief from the valium. Upon turning patient to assist with bedpan use, RN noted lower reinforcement dressing was saturated and and another 14 inch in diameter pool of blood on the bed pad. MD notified, received order to change dressing and replace with abd pads, gauze, and hypofix tape. Administered PRN pain medication per MAR.  0400- Pt resting comfortably, still c/o pain and spasms in left ankle, but states it is tolerable.  0600- RN able to see some blood through the dressing, but dressing is not saturated and pad is clean, patient c/o pain in ankle, administered PRN pain meds as per MAR.

## 2017-09-03 NOTE — Anesthesia Postprocedure Evaluation (Signed)
Anesthesia Post Note  Patient: [name]  Procedure(s) Performed: Revision of lumbar fusion secondary to Lumbar five fracture/Revision of fixation Lumbar four to Ileum/With Mazor (N/A Back) APPLICATION OF ROBOTIC ASSISTANCE FOR SPINAL PROCEDURE (N/A Back)     Patient location during evaluation: PACU Anesthesia Type: General Level of consciousness: awake and alert Pain management: pain level controlled Vital Signs Assessment: post-procedure vital signs reviewed and stable Respiratory status: spontaneous breathing, nonlabored ventilation, respiratory function stable and patient connected to nasal cannula oxygen Cardiovascular status: blood pressure returned to baseline and stable Postop Assessment: no apparent nausea or vomiting Anesthetic complications: no    Last Vitals:  Vitals:   09/03/17 0300 09/03/17 0747  BP: (!) 127/55 129/64  Pulse: 70 73  Resp: 18 15  Temp: 36.5 C 36.4 C  SpO2: 96% 95%    Last Pain:  Vitals:   09/03/17 0747  TempSrc: Oral  PainSc:                  Tiajuana Amass

## 2017-09-03 NOTE — NC FL2 (Signed)
[place] LEVEL OF CARE SCREENING TOOL     IDENTIFICATION  Patient Name: [name] Birthdate: 05/31/1932 Sex: female Admission Date (Current Location): 08/31/2017  [facility] and [place] Number:  Herbalist and Address:  [facility] [place] [address], [place], [place] [address]      Provider Number: [id number]  Attending Physician Name and Address:  Kristeen Miss, MD  Relative Name and Phone Number:  Krystianna Soth [phone]    Current Level of Care: Hospital Recommended Level of Care: [place] Prior Approval Number:   [id number]  Date Approved/Denied:  09/03/2017 PASRR Number:    Discharge Plan: SNF    Current Diagnoses: Patient Active Problem List   Diagnosis Date Noted  . Closed L5 vertebral fracture ([place]) 08/31/2017  . Closed fracture of fifth lumbar vertebra ([place]) 08/30/2017  . Lumbar vertebral fracture ([place]) 07/17/2017  . Scoliosis 06/13/2017    Orientation RESPIRATION BLADDER Height & Weight     Self, Time, Situation, Place  Normal Continent Weight: 184 lb 4.9 oz (83.6 kg) Height:  5\' 6"  (167.6 cm)  BEHAVIORAL SYMPTOMS/MOOD NEUROLOGICAL BOWEL NUTRITION STATUS      Continent (See Discharge Summary)  AMBULATORY STATUS COMMUNICATION OF NEEDS Skin   Limited Assist   Surgical wounds(Surgical Incession)                       Personal Care Assistance Level of Assistance  Bathing, Feeding, Dressing, Total care Bathing Assistance: Maximum assistance Feeding assistance: Independent Dressing Assistance: Maximum assistance Total Care Assistance: Limited assistance   Functional Limitations Info             SPECIAL CARE FACTORS FREQUENCY  PT (By licensed PT), OT (By licensed OT)     PT Frequency: 5 OT Frequency: 5            Contractures      Additional Factors Info  Code Status, Allergies Code Status Info: Full Allergies Info: Keflex           Current Medications  (09/03/2017):  This is the current hospital active medication list Current Facility-Administered Medications  Medication Dose Route Frequency Provider Last Rate Last Dose  . 0.9 %  sodium chloride infusion  250 mL Intravenous Continuous Kristeen Miss, MD      . acetaminophen (TYLENOL) tablet 650 mg  650 mg Oral Q4H PRN Kristeen Miss, MD       Or  . acetaminophen (TYLENOL) suppository 650 mg  650 mg Rectal Q4H PRN Kristeen Miss, MD      . alum & mag hydroxide-simeth (MAALOX/MYLANTA) 200-200-20 MG/5ML suspension 30 mL  30 mL Oral Q6H PRN Kristeen Miss, MD      . bisacodyl (DULCOLAX) suppository 10 mg  10 mg Rectal Daily PRN Kristeen Miss, MD      . bumetanide (BUMEX) tablet 0.5 mg  0.5 mg Oral Daily PRN Kristeen Miss, MD      . diphenhydrAMINE (BENADRYL) capsule 25 mg  25 mg Oral QHS PRN Hammonds, Sharyn Blitz, MD      . docusate sodium (COLACE) capsule 100 mg  100 mg Oral BID Kristeen Miss, MD   100 mg at 09/02/17 2218  . enoxaparin (LOVENOX) injection 40 mg  40 mg Subcutaneous Q24H Kristeen Miss, MD   40 mg at 09/02/17 0858  . hydrALAZINE (APRESOLINE) injection 10 mg  10 mg Intravenous Q6H PRN Arnell Asal, NP      . HYDROcodone-acetaminophen (  NORCO/VICODIN) 5-325 MG per tablet 2 tablet  2 tablet Oral Q4H PRN Kristeen Miss, MD   2 tablet at 09/03/17 0600  . ketorolac (TORADOL) 15 MG/ML injection 15 mg  15 mg Intramuscular Once Kristeen Miss, MD      . ketorolac (TORADOL) 15 MG/ML injection 15 mg  15 mg Intravenous Q8H PRN Hammonds, Sharyn Blitz, MD   15 mg at 09/03/17 0240  . lactated ringers infusion   Intravenous Continuous Kristeen Miss, MD   Stopped at 09/03/17 0730  . lactated ringers infusion   Intravenous Continuous Kristeen Miss, MD   Stopped at 09/03/17 0730  . lactated ringers infusion   Intravenous Continuous Jennelle Human B, NP 75 mL/hr at 09/03/17 0800    . levothyroxine (SYNTHROID, LEVOTHROID) tablet 50 mcg  50 mcg Oral QAC breakfast Kristeen Miss, MD   50 mcg at 09/03/17 0654  .  meclizine (ANTIVERT) tablet 25 mg  25 mg Oral TID PRN Kristeen Miss, MD   25 mg at 09/01/17 0422  . menthol-cetylpyridinium (CEPACOL) lozenge 3 mg  1 lozenge Oral PRN Kristeen Miss, MD       Or  . phenol (CHLORASEPTIC) mouth spray 1 spray  1 spray Mouth/Throat PRN Kristeen Miss, MD      . methocarbamol (ROBAXIN) tablet 500 mg  500 mg Oral Q8H PRN Hammonds, Sharyn Blitz, MD   500 mg at 09/03/17 0277   Or  . methocarbamol (ROBAXIN) 500 mg in dextrose 5 % 50 mL IVPB  500 mg Intravenous Q8H PRN Hammonds, Sharyn Blitz, MD      . mineral oil enema 1 enema  1 enema Rectal Daily PRN Hammonds, Sharyn Blitz, MD      . morphine 2 MG/ML injection 2 mg  2 mg Intravenous Q2H PRN Hammonds, Sharyn Blitz, MD      . MUSCLE RUB CREA   Topical PRN Kristeen Miss, MD      . ondansetron (ZOFRAN) tablet 4 mg  4 mg Oral Q6H PRN Kristeen Miss, MD       Or  . ondansetron (ZOFRAN) injection 4 mg  4 mg Intravenous Q6H PRN Kristeen Miss, MD      . pantoprazole (PROTONIX) EC tablet 40 mg  40 mg Oral Daily Kristeen Miss, MD   40 mg at 09/02/17 0901  . polyethylene glycol (MIRALAX / GLYCOLAX) packet 17 g  17 g Oral Daily PRN Kristeen Miss, MD      . senna (SENOKOT) tablet 8.6 mg  1 tablet Oral BID Kristeen Miss, MD   8.6 mg at 09/02/17 2218  . sodium chloride flush (NS) 0.9 % injection 3 mL  3 mL Intravenous Q12H Elsner, Mallie Mussel, MD      . sodium chloride flush (NS) 0.9 % injection 3 mL  3 mL Intravenous PRN Kristeen Miss, MD         Discharge Medications: Please see discharge summary for a list of discharge medications.  Relevant Imaging Results:  Relevant Lab Results:   Additional Information [id number]  Wendelyn Breslow, LCSW

## 2017-09-03 NOTE — Progress Notes (Signed)
Occupational Therapy Evaluation Patient Details Name: [name] MRN: [id number] DOB: 1932/08/22 Today's Date: 09/03/2017    History of Present Illness pt is an 84 y/o female with recent lumbar fusion surgery in March with revision in April for L2 failure.  Now admitted with progressively worsening severe buttock and left LE pain, CT showing L5 fracture with loss of fixation.  pt s/p fixation from T10 to the ileum, decompression of L5 root, and arthrodesis at L5/S1.   Clinical Impression   PTA, pt was requiring assistance from her husband with ADL due to back/ LLE pain. Pt states her mobility was limited. Prior to "back problems in April", pt states she was independent with ADL and mobility. Session limited today due to pain, although encouraged pt to mobilize OOB. Pt will benefit from rehab at SNF to maximize functional level of independence and facilitate safe DC home. Will follow acutely to address established goals.     Follow Up Recommendations  Supervision/Assistance - 24 hour;SNF    Equipment Recommendations  None recommended by OT    Recommendations for Other Services       Precautions / Restrictions Precautions Precautions: Fall;Back Precaution Booklet Issued: Yes (comment) Precaution Comments: Reviewed 3/3 back precautions. Required Braces or Orthoses: Spinal Brace Spinal Brace: Lumbar corset;Applied in sitting position Restrictions Weight Bearing Restrictions: No      Mobility Bed Mobility Overal bed mobility: Needs Assistance Bed Mobility: Rolling Rolling: Min assist        General bed mobility comments: Heavy use of rail  Transfers       General transfer comment: per PT Mod A +2. Pt delicned OOB mobility; educated on importance of mobility    Balance                         ADL either performed or assessed with clinical judgement   ADL Overall ADL's : Needs assistance/impaired Eating/Feeding: Independent;Bed level   Grooming:  Wash/dry face;Oral care;Bed level   Upper Body Bathing: Sitting;Set up;Bed level   Lower Body Bathing: Moderate assistance;Bed level   Upper Body Dressing : Minimal assistance;Bed level   Lower Body Dressing: Maximal assistance;Bed level               Functional mobility during ADLs: (pt declined OOB mobility) General ADL Comments: Pt has AE from previous back surgery; unable to cross feet over knees     Vision Patient Visual Report: No change from baseline       Perception     Praxis      Pertinent Vitals/Pain Pain Assessment: 0-10 Pain Score: 10-Worst pain ever Faces Pain Scale: Hurts even more Pain Location: LLE Pain Descriptors / Indicators: Sore;Guarding;Grimacing Pain Intervention(s): Limited activity within patient's tolerance;Repositioned;Patient requesting pain meds-RN notified(able to get pain meds in 40 min )     Hand Dominance Right   Extremity/Trunk Assessment Upper Extremity Assessment Upper Extremity Assessment: Overall WFL for tasks assessed   Lower Extremity Assessment Lower Extremity Assessment: Defer to PT evaluation LLE Deficits / Details: Still has numbness L foot and lower leg but not having knee instability that she had before surgery. LLE generally 3/5 throughout LLE: Unable to fully assess due to pain LLE Sensation: decreased light touch LLE Coordination: decreased fine motor   Cervical / Trunk Assessment Cervical / Trunk Assessment: Other exceptions Cervical / Trunk Exceptions: s/p spinal sx   Communication Communication Communication: No difficulties   Cognition Arousal/Alertness: Awake/alert Behavior During Therapy: [facility] for  tasks assessed/performed Overall Cognitive Status: Within Functional Limits for tasks assessed                                     General Comments       Exercises     Shoulder Instructions      Home Living Family/patient expects to be discharged to:: Skilled nursing facility Living  Arrangements: Spouse/significant other Available Help at Discharge: Family Type of Home: House Home Access: Stairs to enter [employer] of Steps: 2 Entrance Stairs-Rails: Right Home Layout: Multi-level Alternate Level Stairs-Number of Steps: 4 Alternate Level Stairs-Rails: Right Bathroom Shower/Tub: Walk-in shower;Tub/shower unit   Bathroom Toilet: Standard     Home Equipment: Cane - single point;Tub bench;Walker - 2 wheels;Bedside commode;Adaptive equipment Adaptive Equipment: Reacher;Sock aid;Long-handled shoe horn;Long-handled sponge        Prior Functioning/Environment Level of Independence: Needs assistance  Gait / Transfers Assistance Needed: recently has only ambulated short distances at best. ADL's / Homemaking Assistance Needed: She reports spouse was assisting her with LB ADLs due to pain    Comments: drives, did all ADLs and IADLs prior to back problems, husband has been doing  housework lately due to low back pain        OT Problem List: Decreased activity tolerance;Decreased knowledge of use of DME or AE;Decreased knowledge of precautions;Pain;Decreased range of motion      OT Treatment/Interventions: Self-care/ADL training;DME and/or AE instruction;Therapeutic activities;Patient/family education;Balance training    OT Goals(Current goals can be found in the care plan section) Acute Rehab OT Goals Patient Stated Goal: Get back to being active OT Goal Formulation: With patient Time For Goal Achievement: 09/17/17 Potential to Achieve Goals: Good  OT Frequency: Min 2X/week   Barriers to D/C:            Co-evaluation              AM-PAC PT "6 Clicks" Daily Activity     Outcome Measure Help from another person eating meals?: None Help from another person taking care of personal grooming?: A Little Help from another person toileting, which includes using toliet, bedpan, or urinal?: A Little Help from another person bathing (including  washing, rinsing, drying)?: A Lot Help from another person to put on and taking off regular upper body clothing?: A Little Help from another person to put on and taking off regular lower body clothing?: A Lot 6 Click Score: 17   End of Session Equipment Utilized During Treatment: (3 in 1) Nurse Communication: Mobility status;Patient requests pain meds  Activity Tolerance: Patient limited by pain Patient left: in bed;with call bell/phone within reach;with bed alarm set;with family/visitor present  OT Visit Diagnosis: Pain;Muscle weakness (generalized) (M62.81) Pain - Right/Left: (back/LLE) Pain - part of body: Leg(back )                Time: 7846-9629 OT Time Calculation (min): 15 min Charges:  OT General Charges $OT Visit: 1 Visit OT Evaluation $OT Eval Moderate Complexity: 1 Mod G-Codes:     Maurie Boettcher, OT/L  OT Clinical Specialist [phone]   [facility] 09/03/2017, 3:22 PM

## 2017-09-04 ENCOUNTER — Other Ambulatory Visit: Payer: Self-pay

## 2017-09-04 DIAGNOSIS — I10 Essential (primary) hypertension: Secondary | ICD-10-CM

## 2017-09-04 DIAGNOSIS — R42 Dizziness and giddiness: Secondary | ICD-10-CM

## 2017-09-04 DIAGNOSIS — E039 Hypothyroidism, unspecified: Secondary | ICD-10-CM

## 2017-09-04 DIAGNOSIS — M792 Neuralgia and neuritis, unspecified: Secondary | ICD-10-CM

## 2017-09-04 DIAGNOSIS — T380X5A Adverse effect of glucocorticoids and synthetic analogues, initial encounter: Secondary | ICD-10-CM

## 2017-09-04 DIAGNOSIS — M1711 Unilateral primary osteoarthritis, right knee: Secondary | ICD-10-CM

## 2017-09-04 DIAGNOSIS — D62 Acute posthemorrhagic anemia: Secondary | ICD-10-CM

## 2017-09-04 DIAGNOSIS — R000 Tachycardia, unspecified: Secondary | ICD-10-CM

## 2017-09-04 DIAGNOSIS — S32059A Unspecified fracture of fifth lumbar vertebra, initial encounter for closed fracture: Secondary | ICD-10-CM

## 2017-09-04 DIAGNOSIS — Z981 Arthrodesis status: Secondary | ICD-10-CM

## 2017-09-04 DIAGNOSIS — M5030 Other cervical disc degeneration, unspecified cervical region: Secondary | ICD-10-CM

## 2017-09-04 DIAGNOSIS — R739 Hyperglycemia, unspecified: Secondary | ICD-10-CM

## 2017-09-04 DIAGNOSIS — S32059K Unspecified fracture of fifth lumbar vertebra, subsequent encounter for fracture with nonunion: Secondary | ICD-10-CM

## 2017-09-04 DIAGNOSIS — Z86718 Personal history of other venous thrombosis and embolism: Secondary | ICD-10-CM

## 2017-09-04 LAB — TYPE AND SCREEN
ABO/RH(D): A POS
Antibody Screen: NEGATIVE
Unit division: 0
Unit division: 0
Unit division: 0
Unit division: 0
Unit division: 0
Unit division: 0

## 2017-09-04 LAB — BPAM RBC
Blood Product Expiration Date: 201906222359
Blood Product Expiration Date: 201906242359
Blood Product Expiration Date: 201906242359
Blood Product Expiration Date: 201906242359
Blood Product Expiration Date: 201906242359
Blood Product Expiration Date: 201906242359
ISSUE DATE / TIME: 201905311856
ISSUE DATE / TIME: 201905311856
ISSUE DATE / TIME: 201905312200
ISSUE DATE / TIME: 201905312200
ISSUE DATE / TIME: 201905312247
ISSUE DATE / TIME: 201905312247
Unit Type and Rh: 6200
Unit Type and Rh: 6200
Unit Type and Rh: 6200
Unit Type and Rh: 6200
Unit Type and Rh: 6200
Unit Type and Rh: 6200

## 2017-09-04 LAB — POCT I-STAT 4, (NA,K, GLUC, HGB,HCT)
Glucose, Bld: 129 mg/dL — ABNORMAL HIGH (ref 65–99)
HCT: 29 % — ABNORMAL LOW (ref 36.0–46.0)
Hemoglobin: 9.9 g/dL — ABNORMAL LOW (ref 12.0–15.0)
Potassium: 3.8 mmol/L (ref 3.5–5.1)
Sodium: 138 mmol/L (ref 135–145)

## 2017-09-04 NOTE — H&P (Signed)
Chief complaint is back bilateral lower extremity pain and weakness  History of present illness: This is a readmission for [name] who was hospitalized on 2 occasions in the last 3 months.  Her initial hospitalization was to decompress L2-L5 for severe spondylitic stenosis in addition to a degenerative scoliosis that was starting to form.  Soon after that surgery she developed significant back pain and it was noted that she had fractured the laminar arch and pedicles of L2.  This caused an acute kyphotic deformity.  She ultimately required revision surgery to decompress and stabilize L2-3 and L1 to with fixation extended to T10.  After that hospitalization she was home for a week or 2 when she developed worsening back and left lower extremity pain was then found to have a fracture of the L5 pedicle inferiorly.  She has had severe and excruciating left lower extremity pain.  She is admitted for myelography followed by surgical revision of her lower lumbar spine to include decompression of L5 in addition to fixation to the ileum with robotically assisted hardware.  She is now admitted for those procedures.  Impression: Loss of fixation at L5 with left L5 radiculopathy Plan: Myelography for visual confirmation of anatomic findings followed by surgical revision to decompress L5 and stabilize to the ileum.

## 2017-09-04 NOTE — Progress Notes (Addendum)
Physical Therapy Treatment Patient Details Name: [name] MRN: [id number] DOB: 07-Jan-1933 Today's Date: 09/04/2017    History of Present Illness pt is an 84 y/o female with recent lumbar fusion surgery in March with revision in April for L2 failure.  Now admitted with progressively worsening severe buttock and left LE pain, CT showing L5 fracture with loss of fixation.  pt s/p fixation from T10 to the ileum, decompression of L5 root, and arthrodesis at L5/S1.    PT Comments    Improving slowly.  Extensive discussion on SNF vs CIR.  Helped pt arrive at going to SNF for rehab.  Emphasis on safe transfer technique, gait stability and stamina.    Follow Up Recommendations  SNF;Supervision/Assistance - 24 hour     Equipment Recommendations  None recommended by PT    Recommendations for Other Services       Precautions / Restrictions Precautions Precautions: Fall;Back Precaution Booklet Issued: Yes (comment) Precaution Comments: Reviewed 3/3 back precautions. Required Braces or Orthoses: Spinal Brace Spinal Brace: Lumbar corset;Applied in sitting position Restrictions Weight Bearing Restrictions: No    Mobility  Bed Mobility               General bed mobility comments: OOB in recliner upon arrival   Transfers Overall transfer level: Needs assistance Equipment used: Rolling walker (2 wheeled) Transfers: Sit to/from Stand Sit to Stand: Min assist;+2 safety/equipment         General transfer comment: assist to rise and steady at RW, increased time, VCs for hand placement during transitions   Ambulation/Gait Ambulation/Gait assistance: Min guard Ambulation Distance (Feet): 60 Feet Assistive device: Rolling walker (2 wheeled) Gait Pattern/deviations: Step-through pattern;Decreased step length - right;Decreased step length - left;Decreased stride length Gait velocity: decreased Gait velocity interpretation: <1.8 ft/sec, indicate of risk for recurrent  falls General Gait Details: guarded initially with mildly increased step length and less tentativeness with increased distance.  postural cues and cues for better positioning in the rW   Stairs             Wheelchair Mobility    Modified Rankin (Stroke Patients Only)       Balance Overall balance assessment: Needs assistance Sitting-balance support: Feet supported Sitting balance-Leahy Scale: Fair Sitting balance - Comments: less reliant on UE's due to pain   Standing balance support: Bilateral upper extremity supported;During functional activity;No upper extremity supported Standing balance-Leahy Scale: Fair Standing balance comment: pt able to static stand without UE support intermittently during grooming ADLs with close minguard assist                             Cognition Arousal/Alertness: Awake/alert Behavior During Therapy: WFL for tasks assessed/performed Overall Cognitive Status: Within Functional Limits for tasks assessed                                        Exercises      General Comments General comments (skin integrity, edema, etc.): extensive discussion held and education provided regarding CIR vs SNF level services after discharge with pt/pt family; HR up to 135 with hallway level ambulation       Pertinent Vitals/Pain Pain Assessment: Faces Faces Pain Scale: Hurts even more Pain Location: LLE Pain Descriptors / Indicators: Sore;Guarding;Burning Pain Intervention(s): Monitored during session    Home Living  Prior Function            PT Goals (current goals can now be found in the care plan section) Acute Rehab PT Goals Patient Stated Goal: Get back to being active PT Goal Formulation: With patient Time For Goal Achievement: 09/16/17 Potential to Achieve Goals: Fair Progress towards PT goals: Progressing toward goals    Frequency    Min 5X/week      PT Plan Current plan  remains appropriate    Co-evaluation PT/OT/SLP Co-Evaluation/Treatment: Yes Reason for Co-Treatment: To address functional/ADL transfers PT goals addressed during session: Mobility/safety with mobility        AM-PAC PT "6 Clicks" Daily Activity  Outcome Measure  Difficulty turning over in bed (including adjusting bedclothes, sheets and blankets)?: Unable Difficulty moving from lying on back to sitting on the side of the bed? : Unable Difficulty sitting down on and standing up from a chair with arms (e.g., wheelchair, bedside commode, etc,.)?: Unable Help needed moving to and from a bed to chair (including a wheelchair)?: A Lot Help needed walking in hospital room?: A Lot Help needed climbing 3-5 steps with a railing? : Total 6 Click Score: 8    End of Session Equipment Utilized During Treatment: Back brace Activity Tolerance: Patient tolerated treatment well Patient left: in chair;with call bell/phone within reach Nurse Communication: Mobility status PT Visit Diagnosis: Other abnormalities of gait and mobility (R26.89);Other symptoms and signs involving the nervous system (R29.898);Pain Pain - Right/Left: Left Pain - part of body: Knee     Time: 3846-6599 PT Time Calculation (min) (ACUTE ONLY): 34 min  Charges:  $Gait Training: 8-22 mins $Therapeutic Activity: 8-22 mins                    G Codes:       09-19-17  [name], PT [phone]  (pager)   [name] 09/19/17, 4:48 PM

## 2017-09-04 NOTE — Consult Note (Signed)
Home      Physical Medicine and Rehabilitation Consult   Reason for Consult: Functional deficits due to multiple recent surgeries fro Lumbar radiculopathy/cladication/fractures. Referring Physician: Dr. Ellene Route    HPI: [name] is a 84 y.o. female with history of OA right knee, vertigo, DDD with prior L5/S1 decompression and with reports of severe back pain with radiculopathy BLE and neurogenic claudication with weakness due to degenerative lumbar scoliosis L2-L5 requiring decompressive lam L2-L5 with fusion on 7/82/95 with complicated post op course. History taken from chart review and patient. Husband provided assistance with ADLs, but is limited. She developed increase in back pain with severe radiculopathy due to L2 fracture with retropulsion treated with T 10- L2 fusion and was discharged to home with daughter in law from [place] with ADLs. She was readmitted on 08/31/17 requiring surgical repair 4/15 as well as multiple units PRBC for ABLA. She was readmitted with RLE DVT as well as severe LLE and left buttock pain. CT spine revealed L5 fracture with loss of fixation and she underwent revision of fixation from T10- ileum with placement of S1 and S2 alar screws. Post op with lethargy due to polypharmacy as well as drainage from wound. She continues to be limited by weakness as well as back pain. CIR consulted for intensive rehab program to help achieve prior level of independence.    Review of Systems  Musculoskeletal: Positive for back pain, joint pain and myalgias.  Neurological: Positive for sensory change, focal weakness and weakness.  All other systems reviewed and are negative.     Past Medical History:  Diagnosis Date  . Arthritis   . GERD (gastroesophageal reflux disease)   . History of blood transfusion   . Hypothyroidism   . PONV (postoperative nausea and vomiting)   . Ptosis of both eyelids   . Vertigo   . Wears glasses    reading    Past Surgical  History:  Procedure Laterality Date  . ABDOMINAL HYSTERECTOMY  1986  . APPLICATION OF ROBOTIC ASSISTANCE FOR SPINAL PROCEDURE N/A 07/17/2017   Procedure: APPLICATION OF ROBOTIC ASSISTANCE FOR SPINAL PROCEDURE;  Surgeon: Kristeen Miss, MD;  Location: [facility];  Service: Neurosurgery;  Laterality: N/A;  . [place]  2000   right  . CARPAL TUNNEL RELEASE  2012   left  . COLONOSCOPY    . CYSTOCELE REPAIR  2007   sling  . DISTAL INTERPHALANGEAL JOINT FUSION Right 02/04/2014   Procedure: FUSION DISTAL INTERPHALANGEAL JOINT RIGHT INDEX FINGER ;  Surgeon: Daryll Brod, MD;  Location: [facility];  Service: Orthopedics;  Laterality: Right;  . EYE SURGERY Bilateral    Cataract surgery with lens implant  . KNEE SURGERY  2009,2011   partial knee  . OPEN REDUCTION INTERNAL FIXATION (ORIF) PROXIMAL PHALANX Left 08/30/2013   Procedure: OPEN REDUCTION INTERNAL FIXATION (ORIF) PROXIMAL PHALANX FRACTURE LEFT SMALL FINGER; SPLINT RING FINGER;  Surgeon: Wynonia Sours, MD;  Location: [facility];  Service: Orthopedics;  Laterality: Left;  . POSTERIOR LUMBAR FUSION 4 LEVEL N/A 07/17/2017   Procedure: Thoracic Ten - Lumbar Five revison of hardware with Mazor;  Surgeon: Kristeen Miss, MD;  Location: [facility];  Service: Neurosurgery;  Laterality: N/A;  Thoracic/Lumbar  . PTOSIS REPAIR Bilateral 07/27/2015   Procedure: PTOSIS REPAIR;  Surgeon: Cristine Polio, MD;  Location: [facility];  Service: Plastics;  Laterality: Bilateral;  . SHOULDER SURGERY  rt rcr,and lt  . TONSILLECTOMY     No pertinent family history of back surgeries.   Social History:  reports that she has quit smoking. She has never used smokeless tobacco. She reports that she does not drink alcohol or use drugs.    Allergies  Allergen Reactions  . Keflex [Cephalexin] Hives    Medications Prior to Admission  Medication Sig Dispense Refill  .  diphenhydramine-acetaminophen (TYLENOL PM) 25-500 MG TABS tablet Take 2 tablets by mouth at bedtime.     Marland Kitchen HYDROcodone-acetaminophen (NORCO/VICODIN) 5-325 MG tablet Take 1-2 tablets by mouth every 4 (four) hours as needed for moderate pain ((score 4 to 6)). (Patient taking differently: Take 1 tablet by mouth every evening. May take a second dose during the day as needed for pain) 60 tablet 0  . levothyroxine (SYNTHROID, LEVOTHROID) 50 MCG tablet Take 50 mcg by mouth daily.    . meloxicam (MOBIC) 7.5 MG tablet Take 7.5 mg by mouth daily.    Marland Kitchen omeprazole (PRILOSEC) 20 MG capsule Take 20 mg by mouth daily.    Marland Kitchen tiZANidine (ZANAFLEX) 4 MG capsule Take 4 mg by mouth 3 (three) times daily as needed for muscle spasms.    . bumetanide (BUMEX) 0.5 MG tablet Take 0.5 mg by mouth daily as needed (swelling).    Marland Kitchen ELIQUIS 2.5 MG TABS tablet Take 2.5 mg by mouth 2 (two) times daily.  3  . meclizine (ANTIVERT) 25 MG tablet Take 25 mg by mouth 3 (three) times daily as needed for dizziness.    . methocarbamol (ROBAXIN) 500 MG tablet Take 1 tablet (500 mg total) by mouth every 6 (six) hours as needed for muscle spasms. (Patient not taking: Reported on 08/25/2017) 40 tablet 5    Home: [place] expects to be discharged to:: Skilled nursing facility Living Arrangements: Spouse/significant other Available Help at Discharge: Family Type of Home: House Home Access: Stairs to enter Technical brewer of Steps: 2 Entrance Stairs-Rails: Right Home Layout: Multi-level Alternate Level Stairs-Number of Steps: 4 Alternate Level Stairs-Rails: Right Bathroom Shower/Tub: Gaffer, Chiropodist: Standard Home Equipment: Cane - single point, Tub bench, Environmental consultant - 2 wheels, Bedside commode, Adaptive equipment Adaptive Equipment: Reacher, Sock aid, Long-handled shoe horn, Long-handled sponge  Functional History: Prior Function Level of Independence: Needs assistance Gait /  Transfers Assistance Needed: recently has only ambulated short distances at best. ADL's / Homemaking Assistance Needed: She reports spouse was assisting her with LB ADLs due to pain  Comments: drives, did all ADLs and IADLs prior to back problems, husband has been doing  housework lately due to low back pain Functional Status:  Mobility: Bed Mobility Overal bed mobility: Needs Assistance Bed Mobility: Rolling Rolling: Min assist Sidelying to sit: Mod assist, HOB elevated General bed mobility comments: Heavy use of rail Transfers Overall transfer level: Needs assistance Equipment used: Rolling walker (2 wheeled) Transfers: Sit to/from Stand Sit to Stand: Mod assist, +2 physical assistance Stand pivot transfers: Min assist, +2 physical assistance General transfer comment: per PT Mod A +2. Pt delicned OOB mobility; educated on importance of mobility Ambulation/Gait Ambulation/Gait assistance: Min assist, +2 physical assistance Ambulation Distance (Feet): 3 Feet Assistive device: Rolling walker (2 wheeled) Gait Pattern/deviations: Step-to pattern, Decreased stance time - left, Trunk flexed General Gait Details: Pt able to clear left/right foot in swing phase. She reports dragging L foot earlier today. Gait velocity: decreased Gait velocity interpretation: <1.31 ft/sec, indicative of household ambulator    ADL: ADL  Overall ADL's : Needs assistance/impaired Eating/Feeding: Independent, Bed level Grooming: Wash/dry face, Oral care, Bed level Upper Body Bathing: Sitting, Set up, Bed level Lower Body Bathing: Moderate assistance, Bed level Upper Body Dressing : Minimal assistance, Bed level Lower Body Dressing: Maximal assistance, Bed level Functional mobility during ADLs: (pt declined OOB mobility) General ADL Comments: Pt has AE from previous back surgery; unable to cross feet over knees  Cognition: Cognition Overall Cognitive Status: Within Functional Limits for tasks  assessed Orientation Level: Oriented X4 Cognition Arousal/Alertness: Awake/alert Behavior During Therapy: WFL for tasks assessed/performed Overall Cognitive Status: Within Functional Limits for tasks assessed   Blood pressure (!) 159/74, pulse 79, temperature 98.4 F (36.9 C), temperature source Oral, resp. rate 17, height 5\' 6"  (1.676 m), weight 83.6 kg (184 lb 4.9 oz), SpO2 95 %. Physical Exam  Constitutional: She is oriented to person, place, and time. She appears well-developed and well-nourished.  HENT:  Head: Normocephalic and atraumatic.  Eyes: EOM are normal. Right eye exhibits no discharge. Left eye exhibits no discharge.  Neck: Normal range of motion. Neck supple.  Cardiovascular: Regular rhythm.  +Tachycardia  Respiratory: Effort normal and breath sounds normal.  GI: Soft. Bowel sounds are normal.  Musculoskeletal:  LE edema  Neurological: She is alert and oriented to person, place, and time.  HOH Motor: RUE: 5/5 proximal to distal LUE: 4+/5 proximal to distal RLE: 4-4+/5 proximal to distal LLE: 4/5 proximal to distal  Skin: Skin is warm and dry.  Serosanguinous drainage from back incision  Psychiatric: She has a normal mood and affect. Her behavior is normal.    Results for orders placed or performed during the hospital encounter of 08/31/17 (from the past 24 hour(s))  CBC with Differential/Platelet     Status: Abnormal   Collection Time: 09/03/17  9:15 AM  Result Value Ref Range   WBC 5.8 4.0 - 10.5 K/uL   RBC 3.04 (L) 3.87 - 5.11 MIL/uL   Hemoglobin 8.3 (L) 12.0 - 15.0 g/dL   HCT 26.3 (L) 36.0 - 46.0 %   MCV 86.5 78.0 - 100.0 fL   MCH 27.3 26.0 - 34.0 pg   MCHC 31.6 30.0 - 36.0 g/dL   RDW 14.1 11.5 - 15.5 %   Platelets 268 150 - 400 K/uL   Neutrophils Relative % 53 %   Neutro Abs 3.1 1.7 - 7.7 K/uL   Lymphocytes Relative 28 %   Lymphs Abs 1.6 0.7 - 4.0 K/uL   Monocytes Relative 14 %   Monocytes Absolute 0.8 0.1 - 1.0 K/uL   Eosinophils Relative 5 %    Eosinophils Absolute 0.3 0.0 - 0.7 K/uL   Basophils Relative 0 %   Basophils Absolute 0.0 0.0 - 0.1 K/uL   Immature Granulocytes 0 %   Abs Immature Granulocytes 0.0 0.0 - 0.1 K/uL   No results found.  Assessment/Plan: Diagnosis: LE weakness Labs independently reviewed.  Records reviewed and summated above.  1. Does the need for close, 24 hr/day medical supervision in concert with the patient's rehab needs make it unreasonable for this patient to be served in a less intensive setting? Potentially  2. Co-Morbidities requiring supervision/potential complications: OA right knee (ensure pain does not limit therapies), vertigo, DDD with multiple surgeries, hx of RLE DVT, HTN (monitor and provide prns in accordance with increased physical exertion and pain), Tachycardia (monitor in accordance with pain and increasing activity), steroid induced hyperglycemia (Monitor in accordance with exercise and adjust meds as necessary), hypothyroidism (cont meds, ensure  appropriate mood and energy level for therapies), neuropathic pain (cont meds), ABLA (transfuse if necessary to ensure appropriate perfusion for increased activity tolerance) 3. Due to bladder management, safety, skin/wound care, disease management and patient education, does the patient require 24 hr/day rehab nursing? Yes 4. Does the patient require coordinated care of a physician, rehab nurse, PT (1-2 hrs/day, 5 days/week) and OT (1-2 hrs/day, 5 days/week) to address physical and functional deficits in the context of the above medical diagnosis(es)? Potentially Addressing deficits in the following areas: balance, endurance, locomotion, strength, transferring, bathing, dressing, toileting and psychosocial support 5. Can the patient actively participate in an intensive therapy program of at least 3 hrs of therapy per day at least 5 days per week? Potentially 6. The potential for patient to make measurable gains while on inpatient rehab is  excellent 7. Anticipated functional outcomes upon discharge from inpatient rehab are min assist  with PT, min assist with OT, n/a with SLP. 8. Estimated rehab length of stay to reach the above functional goals is: 13-17 days. 9. Anticipated D/C setting: Home 10. Anticipated post D/C treatments: HH therapy and Home excercise program 11. Overall Rehab/Functional Prognosis: good  RECOMMENDATIONS: This patient's condition is appropriate for continued rehabilitative care in the following setting: CIR when medically stable if additional care giver support available (husband will not be able to manage alone). Patient has agreed to participate in recommended program. Potentially Note that insurance prior authorization may be required for reimbursement for recommended care.  Comment: Rehab Admissions Coordinator to follow up.   I have personally performed a face to face diagnostic evaluation, including, but not limited to relevant history and physical exam findings, of this patient and developed relevant assessment and plan.  Additionally, I have reviewed and concur with the physician assistant's documentation above.   Delice Lesch, MD, ABPMR Bary Leriche, PA-C 09/04/2017

## 2017-09-04 NOTE — Progress Notes (Signed)
Ambulated patient up to the [facility], to the bathroom and to the door of room aprox 46ft with lumbar brace on walker and assist x 1. Patient tolerated well.

## 2017-09-04 NOTE — Care Management Important Message (Signed)
Important Message  Patient Details  Name: [name] MRN: [id number] Date of Birth: 02/26/1933   Medicare Important Message Given:  Yes    [name] 09/04/2017, 3:15 PM

## 2017-09-04 NOTE — Progress Notes (Signed)
Patient ID: [name], female   DOB: 1932/06/14, 84 y.o.   MRN: [id number] Vital signs are stable Motor function appears good Left leg pain is decreasing May need some short-term inpatient rehab We will see how progression does today with mobilization Stable

## 2017-09-04 NOTE — Progress Notes (Signed)
Occupational Therapy Treatment Patient Details Name: [name] MRN: [id number] DOB: July 06, 1932 Today's Date: 09/04/2017    History of present illness pt is an 84 y/o female with recent lumbar fusion surgery in March with revision in April for L2 failure.  Now admitted with progressively worsening severe buttock and left LE pain, CT showing L5 fracture with loss of fixation.  pt s/p fixation from T10 to the ileum, decompression of L5 root, and arthrodesis at L5/S1.   OT comments  Pt progressing towards OT goals and motivated to work with therapy this session. Pt completing functional mobility in room and hallway using RW with MinA+2. Completed standing grooming ADLs with overall minA and min cues for compensatory techniques. Pt continues to have limitations due to pain, requiring increased time for ADL and mobility completion. Feel POC remains appropriate at this time. Will continue to follow acutely to progress pt towards established OT goals.    Follow Up Recommendations  Supervision/Assistance - 24 hour;SNF    Equipment Recommendations  None recommended by OT          Precautions / Restrictions Precautions Precautions: Fall;Back Precaution Booklet Issued: Yes (comment) Precaution Comments: Reviewed 3/3 back precautions. Required Braces or Orthoses: Spinal Brace Spinal Brace: Lumbar corset;Applied in sitting position Restrictions Weight Bearing Restrictions: No       Mobility Bed Mobility               General bed mobility comments: OOB in recliner upon arrival   Transfers Overall transfer level: Needs assistance Equipment used: Rolling walker (2 wheeled) Transfers: Sit to/from Stand Sit to Stand: Min assist;+2 safety/equipment         General transfer comment: assist to rise and steady at RW, increased time, VCs for hand placement during transitions     Balance Overall balance assessment: Needs assistance Sitting-balance support: Feet  supported;Bilateral upper extremity supported Sitting balance-Leahy Scale: Good     Standing balance support: Bilateral upper extremity supported;During functional activity Standing balance-Leahy Scale: Fair Standing balance comment: pt able to static stand without UE support intermittently during grooming ADLs with close minguard assist                            ADL either performed or assessed with clinical judgement   ADL Overall ADL's : Needs assistance/impaired     Grooming: Oral care;Min guard;Minimal assistance;Standing Grooming Details (indicate cue type and reason): pt stood at sink approx 5 min while performing oral care, verbal cues for compensatory techniques to adhere to back precautions                              Functional mobility during ADLs: Minimal assistance;Rolling walker;+2 for safety/equipment General ADL Comments: pt up in recliner upon arrival; ambulated to bathroom using RW with overall MinA (+2 for lines/safety); pt stood at sink for grooming ADLs; extensive time spent end of session regarding discussion and pt/family education of CIR vs SNF level services                        Cognition Arousal/Alertness: Awake/alert Behavior During Therapy: WFL for tasks assessed/performed Overall Cognitive Status: Within Functional Limits for tasks assessed  General Comments extensive discussion held and education provided regarding CIR vs SNF level services after discharge with pt/pt family; HR up to 135 with hallway level ambulation     Pertinent Vitals/ Pain       Pain Assessment: Faces Faces Pain Scale: Hurts even more Pain Location: LLE Pain Descriptors / Indicators: Sore;Guarding Pain Intervention(s): Monitored during session;Premedicated before session  Home Living Family/patient expects to be discharged to:: Skilled nursing facility Living  Arrangements: Spouse/significant other                                                    Frequency  Min 2X/week        Progress Toward Goals  OT Goals(current goals can now be found in the care plan section)  Progress towards OT goals: Progressing toward goals  Acute Rehab OT Goals Patient Stated Goal: Get back to being active OT Goal Formulation: With patient Time For Goal Achievement: 09/17/17 Potential to Achieve Goals: Good  Plan Discharge plan remains appropriate    Co-evaluation    PT/OT/SLP Co-Evaluation/Treatment: Yes Reason for Co-Treatment: For patient/therapist safety;To address functional/ADL transfers PT goals addressed during session: Mobility/safety with mobility;Balance;Proper use of DME OT goals addressed during session: ADL's and self-care;Proper use of Adaptive equipment and DME      AM-PAC PT "6 Clicks" Daily Activity     Outcome Measure   Help from another person eating meals?: None Help from another person taking care of personal grooming?: A Little Help from another person toileting, which includes using toliet, bedpan, or urinal?: A Little Help from another person bathing (including washing, rinsing, drying)?: A Lot Help from another person to put on and taking off regular upper body clothing?: A Little Help from another person to put on and taking off regular lower body clothing?: A Lot 6 Click Score: 17    End of Session Equipment Utilized During Treatment: Rolling walker;Back brace  OT Visit Diagnosis: Pain;Muscle weakness (generalized) (M62.81) Pain - Right/Left: (back/LLE ) Pain - part of body: Leg(back )   Activity Tolerance Patient tolerated treatment well   Patient Left in chair;with call bell/phone within reach;with family/visitor present   Nurse Communication Mobility status        Time: 7322-0254 OT Time Calculation (min): 49 min  Charges: OT General Charges $OT Visit: 1 Visit OT  Treatments $Self Care/Home Management : 8-22 mins  Lou Cal, OT Pager [phone] 09/04/2017    [name] 09/04/2017, 12:27 PM

## 2017-09-04 NOTE — Social Work (Signed)
CSW aware pt and pt family prefer [facility] for SNF discharge instead of CIR.  Pt has been accepted at [facility] [place] checking with admissions liaison in regards to private room.   Alexander Mt, Brenda Work [phone]

## 2017-09-04 NOTE — Progress Notes (Signed)
Inpatient Rehabilitation Admissions Coordinator  I met with patient and her spouse at bedside. We discussed goals and expectations of an inpt rehab admit. They prefer SNF rehab at this time and prefer [facility]. I have alerted SW, [place], and we will now sign off.  Danne Baxter, RN, MSN Rehab Admissions Coordinator [phone] 09/04/2017 1:03 PM

## 2017-09-05 ENCOUNTER — Encounter (HOSPITAL_COMMUNITY): Payer: Self-pay | Admitting: Neurological Surgery

## 2017-09-05 MED ORDER — GABAPENTIN 100 MG PO CAPS: 100.0000 mg | ORAL_CAPSULE | Freq: Every day | ORAL | 2 refills | Status: DC

## 2017-09-05 MED ORDER — METHOCARBAMOL 500 MG PO TABS: 500.0000 mg | ORAL_TABLET | Freq: Three times a day (TID) | ORAL | 3 refills | Status: DC | PRN

## 2017-09-05 MED ORDER — HYDROCODONE-ACETAMINOPHEN 5-325 MG PO TABS: 1.0000 | ORAL_TABLET | ORAL | 0 refills | Status: DC | PRN

## 2017-09-05 NOTE — Clinical Social Work Placement (Signed)
   CLINICAL SOCIAL WORK PLACEMENT  NOTE [facility]  Date:  09/05/2017  Patient Details  Name: [name] MRN: [id number] Date of Birth: 1932-10-16  Clinical Social Work is seeking post-discharge placement for this patient at the [place] level of care (*CSW will initial, date and re-position this form in  chart as items are completed):  Yes   Patient/family provided with [place] Work Department's list of facilities offering this level of care within the geographic area requested by the patient (or if unable, by the patient's family).  Yes   Patient/family informed of their freedom to choose among providers that offer the needed level of care, that participate in Medicare, Medicaid or managed care program needed by the patient, have an available bed and are willing to accept the patient.  Yes   Patient/family informed of [facility]'s ownership interest in [facility] and [facility], as well as of the fact that they are under no obligation to receive care at these facilities.  PASRR submitted to EDS on       PASRR number received on       Existing PASRR number confirmed on 09/03/17     FL2 transmitted to all facilities in geographic area requested by pt/family on 09/04/17     FL2 transmitted to all facilities within larger geographic area on       Patient informed that his/her managed care company has contracts with or will negotiate with certain facilities, including the following:        Yes   Patient/family informed of bed offers received.  Patient chooses bed at [facility], [place]     Physician recommends and patient chooses bed at      Patient to be transferred to [facility] on 09/05/17.  Patient to be transferred to facility by [facility]     Patient family notified on 09/05/17 of transfer.  Name of family member notified:  pt husband at bedside     PHYSICIAN       Additional Comment:     _______________________________________________ Alexander Mt, [place] 09/05/2017, 1:55 PM

## 2017-09-05 NOTE — Social Work (Signed)
Clinical Social Worker facilitated patient discharge including contacting patient family and facility to confirm patient discharge plans.  Clinical information faxed to facility and family agreeable with plan.  CSW arranged ambulance transport via [facility] to [facility]. Aware RN has called [phone]  with report.   Clinical Social Worker will sign off for now as social work intervention is no longer needed. Please consult [place] again if new need arises.  Alexander Mt, [place] Social Worker [phone]

## 2017-09-05 NOTE — Care Management Note (Signed)
Case Management Note  Patient Details  Name: [name] MRN: [id number] Date of Birth: Nov 03, 1932  Subjective/Objective:                  [name] is an 84 year old individual who had a decompression from L2-L5.  Several weeks after her fusion she failed at the L2 level where she fractured both pedicles.  She underwent revision of the upper portion of her fusion with fixation to the T10 vertebrae.  Subsequently she developed severe pain in the buttock and left lower extremity that has been progressively worsening.  CT scan demonstrates that she has fracture through the pedicle of L5.  PTA, pt resided at home with spouse.  She was receiving [facility] services (PT/OT) with [facility] PTA.     Action/Plan: Pt to OR today.  Will continue to follow for home needs as pt progresses.  Expected Discharge Date:  09/05/17               Expected Discharge Plan:  Skilled Nursing Facility  In-House Referral:  Clinical Social Work  Discharge planning Services  CM Consult  Post Acute Care Choice:    Choice offered to:     DME Arranged:    DME Agency:     HH Arranged:    [place] Agency:     Status of Service:  Completed, signed off  If discussed at [employer], dates discussed:    Additional Comments:  09/05/17 J. Avigail Pilling, RN, BSN Pt medically stable for discharge today.  PT/OT recommending SNF for rehab, and CSW has arranged dc to [facility], per pt request.  Plan dc to SNF today, per CSW arrangements.    Reinaldo Raddle, RN, BSN  Trauma/Neuro ICU Case Manager [phone]

## 2017-09-05 NOTE — Care Management Important Message (Signed)
Important Message  Patient Details  Name: [name] MRN: [id number] Date of Birth: January 18, 1933   Medicare Important Message Given:  Yes    Orbie Pyo 09/05/2017, 3:17 PM

## 2017-09-05 NOTE — Progress Notes (Signed)
Physical Therapy Treatment Patient Details Name: [name] MRN: [id number] DOB: 1933-01-26 Today's Date: 09/05/2017    History of Present Illness pt is an 84 y/o female with recent lumbar fusion surgery in March with revision in April for L2 failure.  Now admitted with progressively worsening severe buttock and left LE pain, CT showing L5 fracture with loss of fixation.  pt s/p fixation from T10 to the ileum, decompression of L5 root, and arthrodesis at L5/S1.    PT Comments    Pt still very concerned about her intense pain L LE, but making slow progress despite pain.   Follow Up Recommendations  SNF;Supervision/Assistance - 24 hour     Equipment Recommendations  None recommended by PT    Recommendations for Other Services       Precautions / Restrictions Precautions Precautions: Fall;Back Precaution Booklet Issued: Yes (comment) Precaution Comments: Reviewed 3/3 back precautions. Required Braces or Orthoses: Spinal Brace Spinal Brace: Lumbar corset;Applied in sitting position Restrictions Weight Bearing Restrictions: No    Mobility  Bed Mobility               General bed mobility comments: at EOB on arrival  Transfers Overall transfer level: Needs assistance Equipment used: Rolling walker (2 wheeled) Transfers: Sit to/from Stand Sit to Stand: Mod assist;Min assist;From elevated surface(mod assist first stand from bed)         General transfer comment: assist to rise and steady at RW, increased time, VCs for hand placement during transitions   Ambulation/Gait Ambulation/Gait assistance: Min guard Ambulation Distance (Feet): 60 Feet Assistive device: Rolling walker (2 wheeled) Gait Pattern/deviations: Step-through pattern;Decreased step length - right;Decreased step length - left;Decreased stride length Gait velocity: decreased   General Gait Details: guarded due to pain.  postural cues.   Stairs             Wheelchair Mobility     Modified Rankin (Stroke Patients Only)       Balance Overall balance assessment: Needs assistance Sitting-balance support: Feet supported Sitting balance-Leahy Scale: Fair Sitting balance - Comments: still can't sit unsupported long enough to donn brace due to pain.   Standing balance support: Bilateral upper extremity supported;During functional activity;No upper extremity supported Standing balance-Leahy Scale: Fair                              Cognition Arousal/Alertness: Awake/alert Behavior During Therapy: WFL for tasks assessed/performed Overall Cognitive Status: Within Functional Limits for tasks assessed                                        Exercises      General Comments General comments (skin integrity, edema, etc.): reinforced education to pt/husband.      Pertinent Vitals/Pain Pain Assessment: 0-10 Pain Score: 10-Worst pain ever Pain Location: LLE Pain Descriptors / Indicators: Sore;Guarding;Burning Pain Intervention(s): Limited activity within patient's tolerance;Monitored during session    Home Living                      Prior Function            PT Goals (current goals can now be found in the care plan section) Acute Rehab PT Goals Patient Stated Goal: Get back to being active PT Goal Formulation: With patient Time For Goal Achievement: 09/16/17 Potential to Achieve Goals:  Fair Progress towards PT goals: Progressing toward goals    Frequency    Min 5X/week      PT Plan Current plan remains appropriate    Co-evaluation              AM-PAC PT "6 Clicks" Daily Activity  Outcome Measure  Difficulty turning over in bed (including adjusting bedclothes, sheets and blankets)?: Unable Difficulty moving from lying on back to sitting on the side of the bed? : Unable Difficulty sitting down on and standing up from a chair with arms (e.g., wheelchair, bedside commode, etc,.)?: Unable Help needed  moving to and from a bed to chair (including a wheelchair)?: A Lot Help needed walking in hospital room?: A Lot Help needed climbing 3-5 steps with a railing? : Total 6 Click Score: 8    End of Session Equipment Utilized During Treatment: Back brace Activity Tolerance: Patient tolerated treatment well Patient left: in chair;with call bell/phone within reach Nurse Communication: Mobility status PT Visit Diagnosis: Other abnormalities of gait and mobility (R26.89);Other symptoms and signs involving the nervous system (R29.898);Pain Pain - Right/Left: Left Pain - part of body: Knee     Time: 3546-5681 PT Time Calculation (min) (ACUTE ONLY): 23 min  Charges:  $Gait Training: 8-22 mins $Self Care/Home Management: 8-22                    G Codes:       09-14-2017  [name], PT [phone]  (pager)   Tessie Fass Ellisa Devivo 2017-09-14, 12:41 PM

## 2017-09-05 NOTE — Progress Notes (Signed)
Physician visited

## 2017-09-05 NOTE — Social Work (Signed)
CSW spoke with pt at bedside, pt confirmed desire to discharge to [facility] for SNF.  Pt states understanding of copays as well as that she will start in a semi-private room and be on a list for private room later in her stay.   Admissions liaison Levada Dy with [facility] also called into pt room and answered further questions regarding SNF and placement.  CSW spoke with pt physician and confirmed that discharge is today.   Insurance authorization pending, pt able to discharge when authorization received and discharge summary complete.  Alexander Mt, [place] Work [phone]

## 2017-09-05 NOTE — Progress Notes (Signed)
Pt ambulated to bsc Pt ambulated to bathroom Pt Pt ambulated to door in the room Pt heart rate increased to 136 as the highest

## 2017-09-05 NOTE — Discharge Summary (Signed)
Physician Discharge Summary  Patient ID: LEGEND PECORE MRN: [id number] DOB/AGE: [birth year] 82 y.o.  Admit date: 08/31/2017 Discharge date: 09/05/2017  Admission Diagnoses: L5 fracture with left L5 radiculopathy status post decompression and fusion L2-L5  Discharge Diagnoses: L5 fracture with left L5 radiculopathy status post decompression and fusion L2-L5 Active Problems:   Closed L5 vertebral fracture (HCC)   Osteoarthritis of right knee   Vertigo   DDD (degenerative disc disease), cervical   Benign essential HTN   History of DVT (deep vein thrombosis)   Tachycardia   Steroid-induced hyperglycemia   Hypothyroidism   Neuropathic pain   Acute blood loss anemia   S/P lumbar fusion   Discharged Condition: stable  Hospital Course: Patient was admitted to undergo surgical decompression at L5 with fixation to the ileum.  She had a previous surgery decompressing and fusing L2-L5 this was several months ago.  She initially had fractured the vertebrae at L2 and this required repair to stabilize her vertebrae to T10 most recently she developed a fracture of L5 which caused a severe left L5 radiculopathy.  She underwent decompression and fusion of her lower lumbar spine.  She still has residual symptoms in the left lower extremity.  She is using some muscle rub for this which she can continue.  She is discharged with a prescription for hydrocodone for pain methocarbamol as a muscle relaxer.  We will also give her some gabapentin 100 mg at bedtime.  Consults: None  Significant Diagnostic Studies: None  Treatments: surgery: Revision of fixation from L5 to the ileum.  Decompression of the left L5 nerve root.  Discharge Exam: Blood pressure (!) 165/85, pulse 84, temperature 98.7 F (37.1 C), temperature source Oral, resp. rate 19, height 5\' 6"  (1.676 m), weight 83.6 kg (184 lb 4.9 oz), SpO2 95 %. Incisions are clean and dry.  Station and gait is intact.  Mild tibialis anterior weakness on  the left.  Disposition: Discharge disposition: 03-Skilled Nursing Facility       Discharge Instructions    Call MD for:  redness, tenderness, or signs of infection (pain, swelling, redness, odor or green/yellow discharge around incision site)   Complete by:  As directed    Call MD for:  severe uncontrolled pain   Complete by:  As directed    Call MD for:  temperature >100.4   Complete by:  As directed    Diet - low sodium heart healthy   Complete by:  As directed    Discharge instructions   Complete by:  As directed    Okay to shower. Do not apply salves or appointments to incision. No heavy lifting with the upper extremities greater than 15 pounds. May resume driving when not requiring pain medication and patient feels comfortable with doing so.   Incentive spirometry RT   Complete by:  As directed    Increase activity slowly   Complete by:  As directed      Allergies as of 09/05/2017      Reactions   Keflex [cephalexin] Hives      Medication List    TAKE these medications   bumetanide 0.5 MG tablet Commonly known as:  BUMEX Take 0.5 mg by mouth daily as needed (swelling).   diphenhydramine-acetaminophen 25-500 MG Tabs tablet Commonly known as:  TYLENOL PM Take 2 tablets by mouth at bedtime.   ELIQUIS 2.5 MG Tabs tablet Generic drug:  apixaban Take 2.5 mg by mouth 2 (two) times daily.   HYDROcodone-acetaminophen 5-325 MG  tablet Commonly known as:  NORCO/VICODIN Take 1-2 tablets by mouth every 4 (four) hours as needed for severe pain ((score 7 to 10)). What changed:  reasons to take this   levothyroxine 50 MCG tablet Commonly known as:  SYNTHROID, LEVOTHROID Take 50 mcg by mouth daily.   meclizine 25 MG tablet Commonly known as:  ANTIVERT Take 25 mg by mouth 3 (three) times daily as needed for dizziness.   meloxicam 7.5 MG tablet Commonly known as:  MOBIC Take 7.5 mg by mouth daily.   methocarbamol 500 MG tablet Commonly known as:  ROBAXIN Take 1 tablet  (500 mg total) by mouth every 6 (six) hours as needed for muscle spasms. What changed:  Another medication with the same name was added. Make sure you understand how and when to take each.   methocarbamol 500 MG tablet Commonly known as:  ROBAXIN Take 1 tablet (500 mg total) by mouth every 8 (eight) hours as needed for muscle spasms. What changed:  You were already taking a medication with the same name, and this prescription was added. Make sure you understand how and when to take each.   omeprazole 20 MG capsule Commonly known as:  PRILOSEC Take 20 mg by mouth daily.   tiZANidine 4 MG capsule Commonly known as:  ZANAFLEX Take 4 mg by mouth 3 (three) times daily as needed for muscle spasms.      Contact information for after-discharge care    Destination    [facility] .   Service:  Skilled Nursing Contact information: [address] [phone]              Signed: Earleen Newport 09/05/2017, 1:42 PM

## 2017-09-05 NOTE — Progress Notes (Addendum)
Pt discharge in. Prescription left by physician [place] @ [phone] for report Report given to [facility] @ [phone]

## 2017-09-05 NOTE — Progress Notes (Signed)
Pt ambulated to bsc with PT Pt ambulated to the bathroom  Pt ambulated in the hallway with PT Pt heart rate increased again to the 130s.  Pt sitting in the chair Husband visiting

## 2017-09-05 NOTE — Progress Notes (Signed)
Pt eager to get up and ambulate this morning despite soreness in ankles and hips (bilateral).  Pt did her own hygiene this morning, ambulated to the door in the room, and went back to bed Patient up to bsc with PT and in the hallway to ambulate Pt c/o pain in her left hip 10/10.  Great appetite.  Husband at bedside

## 2017-09-08 MED FILL — Sodium Chloride IV Soln 0.9%: INTRAVENOUS | Qty: 1000 | Status: AC

## 2017-09-08 MED FILL — Heparin Sodium (Porcine) Inj 1000 Unit/ML: INTRAMUSCULAR | Qty: 30 | Status: AC

## 2017-09-11 ENCOUNTER — Ambulatory Visit: Payer: Medicare Other

## 2017-09-27 ENCOUNTER — Ambulatory Visit: Payer: Medicare Other | Attending: Internal Medicine | Admitting: Physical Therapy

## 2017-09-27 ENCOUNTER — Encounter: Payer: Self-pay | Admitting: Physical Therapy

## 2017-09-27 DIAGNOSIS — R262 Difficulty in walking, not elsewhere classified: Secondary | ICD-10-CM | POA: Insufficient documentation

## 2017-09-27 DIAGNOSIS — M62830 Muscle spasm of back: Secondary | ICD-10-CM | POA: Diagnosis present

## 2017-09-27 DIAGNOSIS — M6281 Muscle weakness (generalized): Secondary | ICD-10-CM | POA: Diagnosis present

## 2017-09-27 DIAGNOSIS — M5441 Lumbago with sciatica, right side: Secondary | ICD-10-CM

## 2017-09-27 DIAGNOSIS — M5442 Lumbago with sciatica, left side: Secondary | ICD-10-CM | POA: Insufficient documentation

## 2017-09-27 NOTE — Therapy (Signed)
[facility] [address] [place], [place], [address] Phone: [phone]   Fax:  [phone]  Physical Therapy Evaluation  Patient Details  Name: [name] MRN: [id number] Date of Birth: July 13, 1932 Referring Provider: Ellene Route   Encounter Date: 09/27/2017  PT End of Session - 09/27/17 1124    Visit Number  1    Date for PT Re-Evaluation  11/27/17    PT Start Time  1058    PT Stop Time  1145    PT Time Calculation (min)  47 min    Activity Tolerance  Patient tolerated treatment well    Behavior During Therapy  [facility] for tasks assessed/performed       Past Medical History:  Diagnosis Date  . Arthritis   . GERD (gastroesophageal reflux disease)   . History of blood transfusion   . Hypothyroidism   . PONV (postoperative nausea and vomiting)   . Ptosis of both eyelids   . Vertigo   . Wears glasses    reading    Past Surgical History:  Procedure Laterality Date  . ABDOMINAL HYSTERECTOMY  1986  . APPLICATION OF ROBOTIC ASSISTANCE FOR SPINAL PROCEDURE N/A 07/17/2017   Procedure: APPLICATION OF ROBOTIC ASSISTANCE FOR SPINAL PROCEDURE;  Surgeon: Kristeen Miss, MD;  Location: [facility];  Service: Neurosurgery;  Laterality: N/A;  . APPLICATION OF ROBOTIC ASSISTANCE FOR SPINAL PROCEDURE N/A 09/01/2017   Procedure: APPLICATION OF ROBOTIC ASSISTANCE FOR SPINAL PROCEDURE;  Surgeon: Kristeen Miss, MD;  Location: [facility];  Service: Neurosurgery;  Laterality: N/A;  . [place]  2000   right  . CARPAL TUNNEL RELEASE  2012   left  . COLONOSCOPY    . CYSTOCELE REPAIR  2007   sling  . DISTAL INTERPHALANGEAL JOINT FUSION Right 02/04/2014   Procedure: FUSION DISTAL INTERPHALANGEAL JOINT RIGHT INDEX FINGER ;  Surgeon: Daryll Brod, MD;  Location: [facility];  Service: Orthopedics;  Laterality: Right;  . EYE SURGERY Bilateral    Cataract surgery with lens implant  . KNEE  SURGERY  2009,2011   partial knee  . OPEN REDUCTION INTERNAL FIXATION (ORIF) PROXIMAL PHALANX Left 08/30/2013   Procedure: OPEN REDUCTION INTERNAL FIXATION (ORIF) PROXIMAL PHALANX FRACTURE LEFT SMALL FINGER; SPLINT RING FINGER;  Surgeon: Wynonia Sours, MD;  Location: [facility];  Service: Orthopedics;  Laterality: Left;  . POSTERIOR LUMBAR FUSION 4 LEVEL N/A 07/17/2017   Procedure: Thoracic Ten - Lumbar Five revison of hardware with Mazor;  Surgeon: Kristeen Miss, MD;  Location: [facility];  Service: Neurosurgery;  Laterality: N/A;  Thoracic/Lumbar  . PTOSIS REPAIR Bilateral 07/27/2015   Procedure: PTOSIS REPAIR;  Surgeon: Cristine Polio, MD;  Location: [facility];  Service: Plastics;  Laterality: Bilateral;  . SHOULDER SURGERY     rt rcr,and lt  . TONSILLECTOMY      There were no vitals filed for this visit.   Subjective Assessment - 09/27/17 1100    Subjective  Patient was admitted to undergo surgical decompression at L5 with fixation to the ileum.  She had a previous surgery decompressing and fusing L2-L5 this was several months ago.  She initially had fractured the vertebrae at L2 and this required repair to stabilize her vertebrae to T10 most recently she developed a fracture of L5 which caused a severe left L5 radiculopathy.  She underwent decompression and fusion of her lower lumbar spine.  She still has residual symptoms in the left lower extremity.   This was her 3rd surgery in the past 3 months, she had an original lumbar fusion L2-5 March 12, then in April they had to revise the hardware and go to the thoracic area.   She reports that she had some DVT complications.  No bending, twisting, lifting     Limitations  Lifting;Standing;Walking;House hold activities    Patient Stated Goals  move better, have no pain    Currently in Pain?  Yes    Pain Score  3     Pain Location  Back    Pain Orientation  Lower;Left;Right    Pain Descriptors / Indicators  Aching;Sore     Pain Radiating Towards  having some pain in the right buttock, describes burning sensation in the lower legs and feet    Pain Onset  More than a month ago    Pain Frequency  Constant    Aggravating Factors   standing, walking and bending pain has been up to 10/10 , pain with lying on the right side    Pain Relieving Factors  Emu oil, pain medication helps at best pain a 2-3/10    Effect of Pain on Daily Activities  limits all ADL's         [facility] PT Assessment - 09/27/17 0001      Assessment   Medical Diagnosis  low back pain s/p multilevel fusion    Referring Provider  E[place]    Onset Date/Surgical Date  08/31/17    Prior Therapy  a few visits in the hospital      Precautions   Precautions  Back    Precaution Comments  no lifting, bending and twisting      Balance Screen   Has the patient fallen in the past 6 months  No    Has the patient had a decrease in activity level because of a fear of falling?   No    Is the patient reluctant to leave their home because of a fear of falling?   No      Home Environment   Additional Comments  split level home, has stiars, does housework, would like to do some yardwork      Prior Function   Level of Independence  Independent    Vocation  Retired    Leisure  swim at [employer] 2-3x/week, has not been able to do since Thanksgiving      Posture/Postural Control   Posture Comments  in lumbar Brace, slouched posture, stooped forward with walking      ROM / Strength   AROM / PROM / Strength  AROM;Strength      AROM   Overall AROM Comments  limited 100% for lumbar motions      Strength   Overall Strength Comments  ankles 3+/5, hips 4-/5, knees 4-/5      Flexibility   Soft Tissue Assessment /Muscle Length  yes    Piriformis  tight      Palpation   Palpation comment  tender and tight in the lumbar area, very tight and tender in the right piriformis area      Ambulation/Gait   Gait Comments  uses a FWW. slow, slightly stooped, very antalgic  on the left leg, kind of trendelenberg left      Standardized Balance Assessment   Standardized Balance Assessment  Timed Up and Go Test      Timed Up and Go Test  Normal TUG (seconds)  26                Objective measurements completed on examination: See above findings.      [facility] Adult PT Treatment/Exercise - 09/27/17 0001      Exercises   Exercises  Lumbar      Lumbar Exercises: Aerobic   Nustep  level 3 x 6 minutes             PT Education - 09/27/17 1124    Education Details  standing hip 4 way and side stepping    Person(s) Educated  Patient    Methods  Explanation;Demonstration;Handout    Comprehension  Verbalized understanding       PT Short Term Goals - 09/27/17 1138      PT SHORT TERM GOAL #1   Title  independent with initial HEP    Time  1    Period  Weeks    Status  New        PT Long Term Goals - 09/27/17 1138      PT LONG TERM GOAL #1   Title  decrease pain 50%    Time  8    Period  Weeks    Status  New      PT LONG TERM GOAL #2   Title  increase Lumbar ROM 25%    Time  8    Period  Weeks    Status  New      PT LONG TERM GOAL #3   Title  increase LE strength to 4+/5    Time  8    Period  Weeks    Status  New      PT LONG TERM GOAL #4   Title  go up and down stairs step over step    Time  8    Period  Weeks      PT LONG TERM GOAL #5   Title  walk 600 feet without assistive device    Time  8    Period  Weeks    Status  New             Plan - 09/27/17 1125    Clinical Impression Statement  Patient was admitted to undergo surgical decompression at L5 with fixation to the ileum.  She had a previous surgery decompressing and fusing L2-L5 this was several months ago.  She initially had fractured the vertebrae at L2 and this required repair to stabilize her vertebrae to T10 most recently she developed a fracture of L5 which caused a severe left L5 radiculopathy.  She underwent decompression and fusion of her  lower lumbar spine.  She still has residual symptoms in the left lower extremity. She had 3 surgeris in 3 months.  Very weak in the LE's, when she walks she has a stooped posture and trendelenberg, TUG time was 26 seconds.  She is weak in the LE's and the core mms.  Has tightness and tenderness in the right buttock    Clinical Presentation  Evolving    Clinical Decision Making  Moderate    Rehab Potential  Good    PT Frequency  2x / week    PT Duration  8 weeks    PT Treatment/Interventions  ADLs/Self Care Home Management;Cryotherapy;Electrical Stimulation;Moist Heat;Gait training;Functional mobility training;Therapeutic activities;Stair training;Therapeutic exercise;Balance training;Neuromuscular re-education;Manual techniques;Patient/family education    PT Next Visit Plan  Slowly progress with strength and function, go slow due to complicated and multiple  surgeries    Consulted and Agree with Plan of Care  Patient       Patient will benefit from skilled therapeutic intervention in order to improve the following deficits and impairments:  Abnormal gait, Cardiopulmonary status limiting activity, Decreased activity tolerance, Decreased balance, Decreased mobility, Decreased strength, Postural dysfunction, Improper body mechanics, Impaired flexibility, Pain, Increased muscle spasms, Decreased range of motion, Difficulty walking  Visit Diagnosis: Acute bilateral low back pain with bilateral sciatica - Plan: PT plan of care cert/re-cert  Muscle weakness (generalized) - Plan: PT plan of care cert/re-cert  Difficulty in walking, not elsewhere classified - Plan: PT plan of care cert/re-cert  Muscle spasm of back - Plan: PT plan of care cert/re-cert     Problem List Patient Active Problem List   Diagnosis Date Noted  . Osteoarthritis of right knee   . Vertigo   . DDD (degenerative disc disease), cervical   . Benign essential HTN   . History of DVT (deep vein thrombosis)   . Tachycardia   .  Steroid-induced hyperglycemia   . Hypothyroidism   . Neuropathic pain   . Acute blood loss anemia   . S/P lumbar fusion   . Closed L5 vertebral fracture ([place]) 08/31/2017  . Closed fracture of fifth lumbar vertebra ([place]) 08/30/2017  . Lumbar vertebral fracture ([place]) 07/17/2017  . Scoliosis 06/13/2017    Sumner Boast., PT 09/27/2017, 11:40 AM  [facility] [address] [place], [place], [address] Phone: [phone]   Fax:  [phone]  Name: CARALYNN GELBER MRN: [id number] Date of Birth: Sep 09, 1932

## 2017-10-10 ENCOUNTER — Encounter: Payer: Self-pay | Admitting: Physical Therapy

## 2017-10-10 ENCOUNTER — Ambulatory Visit: Payer: Medicare Other | Attending: Internal Medicine | Admitting: Physical Therapy

## 2017-10-10 DIAGNOSIS — R262 Difficulty in walking, not elsewhere classified: Secondary | ICD-10-CM | POA: Diagnosis present

## 2017-10-10 DIAGNOSIS — M62830 Muscle spasm of back: Secondary | ICD-10-CM | POA: Diagnosis present

## 2017-10-10 DIAGNOSIS — M5442 Lumbago with sciatica, left side: Secondary | ICD-10-CM | POA: Insufficient documentation

## 2017-10-10 DIAGNOSIS — M5441 Lumbago with sciatica, right side: Secondary | ICD-10-CM | POA: Diagnosis present

## 2017-10-10 DIAGNOSIS — M6281 Muscle weakness (generalized): Secondary | ICD-10-CM | POA: Insufficient documentation

## 2017-10-10 NOTE — Therapy (Signed)
[place], [place], [address] Phone: [phone]   Fax:  [phone]  Physical Therapy Treatment  Patient Details  Name: BRITTANNIE TAWNEY MRN: [id number] Date of Birth: 1932/11/19 Referring Provider: Ellene Route   Encounter Date: 10/10/2017  PT End of Session - 10/10/17 1522    Visit Number  2    Date for PT Re-Evaluation  11/27/17    PT Start Time  1435    PT Stop Time  1522    PT Time Calculation (min)  47 min    Activity Tolerance  Patient tolerated treatment well    Behavior During Therapy  [facility] for tasks assessed/performed       Past Medical History:  Diagnosis Date  . Arthritis   . GERD (gastroesophageal reflux disease)   . History of blood transfusion   . Hypothyroidism   . PONV (postoperative nausea and vomiting)   . Ptosis of both eyelids   . Vertigo   . Wears glasses    reading    Past Surgical History:  Procedure Laterality Date  . ABDOMINAL HYSTERECTOMY  1986  . APPLICATION OF ROBOTIC ASSISTANCE FOR SPINAL PROCEDURE N/A 07/17/2017   Procedure: APPLICATION OF ROBOTIC ASSISTANCE FOR SPINAL PROCEDURE;  Surgeon: Kristeen Miss, MD;  Location: [place];  Service: Neurosurgery;  Laterality: N/A;  . APPLICATION OF ROBOTIC ASSISTANCE FOR SPINAL PROCEDURE N/A 09/01/2017   Procedure: APPLICATION OF ROBOTIC ASSISTANCE FOR SPINAL PROCEDURE;  Surgeon: Kristeen Miss, MD;  Location: [place];  Service: Neurosurgery;  Laterality: N/A;  . [place]  2000   right  . CARPAL TUNNEL RELEASE  2012   left  . COLONOSCOPY    . CYSTOCELE REPAIR  2007   sling  . DISTAL INTERPHALANGEAL JOINT FUSION Right 02/04/2014   Procedure: FUSION DISTAL INTERPHALANGEAL JOINT RIGHT INDEX FINGER ;  Surgeon: Daryll Brod, MD;  Location: [place];  Service: Orthopedics;  Laterality: Right;  . EYE SURGERY Bilateral    Cataract surgery with lens implant  . KNEE  SURGERY  2009,2011   partial knee  . OPEN REDUCTION INTERNAL FIXATION (ORIF) PROXIMAL PHALANX Left 08/30/2013   Procedure: OPEN REDUCTION INTERNAL FIXATION (ORIF) PROXIMAL PHALANX FRACTURE LEFT SMALL FINGER; SPLINT RING FINGER;  Surgeon: Wynonia Sours, MD;  Location: [place];  Service: Orthopedics;  Laterality: Left;  . POSTERIOR LUMBAR FUSION 4 LEVEL N/A 07/17/2017   Procedure: Thoracic Ten - Lumbar Five revison of hardware with Mazor;  Surgeon: Kristeen Miss, MD;  Location: [place];  Service: Neurosurgery;  Laterality: N/A;  Thoracic/Lumbar  . PTOSIS REPAIR Bilateral 07/27/2015   Procedure: PTOSIS REPAIR;  Surgeon: Cristine Polio, MD;  Location: [place];  Service: Plastics;  Laterality: Bilateral;  . SHOULDER SURGERY     rt rcr,and lt  . TONSILLECTOMY      There were no vitals filed for this visit.  Subjective Assessment - 10/10/17 1432    Subjective  Patient reports that she is very sore in the back, she feels that it is from her trying to walk without a device and reports that she is really wobbling too much    Currently in Pain?  Yes    Pain Score  5     Pain Location  Back    Pain Orientation  Lower  [place] Adult PT Treatment/Exercise - 10/10/17 0001      Ambulation/Gait   Gait Comments  use a SPC and HHA, slow gait with cues to decrease the trunk lean and the limp, c/o back weakness, left leg c/o weakness, right leg c/o pain      Lumbar Exercises: Stretches   Piriformis Stretch  3 reps;10 seconds      Lumbar Exercises: Aerobic   Nustep  level 4 x 6 minutes      Lumbar Exercises: Supine   Other Supine Lumbar Exercises  seated red tband scapular stabilizatyion, 2.5# LAQ's and marches, abdominal large ball isometrics    Other Supine Lumbar Exercises  weighted ball lift overhead, black tband back extension             PT Education - 10/10/17 1524    Education Details  gave red tband for husband to do  scapular stabilizationa nd HS urls and black tband for back extnesion    Person(s) Educated  Patient;Spouse    Methods  Explanation;Demonstration;Handout;Verbal cues;Tactile cues    Comprehension  Verbalized understanding;Returned demonstration;Verbal cues required       PT Short Term Goals - 10/10/17 1525      PT SHORT TERM GOAL #1   Title  independent with initial HEP    Status  Partially Met        PT Long Term Goals - 09/27/17 1138      PT LONG TERM GOAL #1   Title  decrease pain 50%    Time  8    Period  Weeks    Status  New      PT LONG TERM GOAL #2   Title  increase Lumbar ROM 25%    Time  8    Period  Weeks    Status  New      PT LONG TERM GOAL #3   Title  increase LE strength to 4+/5    Time  8    Period  Weeks    Status  New      PT LONG TERM GOAL #4   Title  go up and down stairs step over step    Time  8    Period  Weeks      PT LONG TERM GOAL #5   Title  walk 600 feet without assistive device    Time  8    Period  Weeks    Status  New            Plan - 10/10/17 1523    Clinical Impression Statement  Patient reported that she was trying to walk without any device and that her "wobble" may have increased pain, I had her walk without and strongly recommeneded that she not do this as it is a lot of trunk motions to the front and tho the side.  She did well with SPC and with HHA, c/o back weakness with any walking and that she feels like she gives into the weakness.      PT Next Visit Plan  Slowly progress with strength and function, go slow due to complicated and multiple surgeries    Consulted and Agree with Plan of Care  Patient       Patient will benefit from skilled therapeutic intervention in order to improve the following deficits and impairments:  Abnormal gait, Cardiopulmonary status limiting activity, Decreased activity tolerance, Decreased balance, Decreased mobility, Decreased strength, Postural dysfunction, Improper body mechanics,  Impaired flexibility, Pain, Increased  muscle spasms, Decreased range of motion, Difficulty walking  Visit Diagnosis: Acute bilateral low back pain with bilateral sciatica  Muscle weakness (generalized)  Difficulty in walking, not elsewhere classified  Muscle spasm of back     Problem List Patient Active Problem List   Diagnosis Date Noted  . Osteoarthritis of right knee   . Vertigo   . DDD (degenerative disc disease), cervical   . Benign essential HTN   . History of DVT (deep vein thrombosis)   . Tachycardia   . Steroid-induced hyperglycemia   . Hypothyroidism   . Neuropathic pain   . Acute blood loss anemia   . S/P lumbar fusion   . Closed L5 vertebral fracture ([place]) 08/31/2017  . Closed fracture of fifth lumbar vertebra ([place]) 08/30/2017  . Lumbar vertebral fracture ([place]) 07/17/2017  . Scoliosis 06/13/2017    Sumner Boast., PT 10/10/2017, 3:26 PM  [place] [address] [place], [place], [address] Phone: [phone]   Fax:  [phone]  Name: COREEN SHIPPEE MRN: [id number] Date of Birth: 09-27-1932

## 2017-10-12 ENCOUNTER — Ambulatory Visit: Payer: Medicare Other | Admitting: Physical Therapy

## 2017-10-12 ENCOUNTER — Encounter: Payer: Self-pay | Admitting: Physical Therapy

## 2017-10-12 DIAGNOSIS — M5442 Lumbago with sciatica, left side: Secondary | ICD-10-CM | POA: Diagnosis not present

## 2017-10-12 DIAGNOSIS — M62830 Muscle spasm of back: Secondary | ICD-10-CM

## 2017-10-12 DIAGNOSIS — R262 Difficulty in walking, not elsewhere classified: Secondary | ICD-10-CM

## 2017-10-12 DIAGNOSIS — M6281 Muscle weakness (generalized): Secondary | ICD-10-CM

## 2017-10-12 DIAGNOSIS — M5441 Lumbago with sciatica, right side: Secondary | ICD-10-CM

## 2017-10-12 NOTE — Therapy (Signed)
[place], [place], [address] Phone: [phone]   Fax:  [phone]  Physical Therapy Treatment  Patient Details  Name: [name] MRN: [id number] Date of Birth: Oct 13, 1932 Referring Provider: Ellene Route   Encounter Date: 10/12/2017  PT End of Session - 10/12/17 1508    Visit Number  3    Date for PT Re-Evaluation  11/27/17    PT Start Time  1430    PT Stop Time  1530    PT Time Calculation (min)  60 min    Activity Tolerance  Patient tolerated treatment well    Behavior During Therapy  [facility] for tasks assessed/performed       Past Medical History:  Diagnosis Date  . Arthritis   . GERD (gastroesophageal reflux disease)   . History of blood transfusion   . Hypothyroidism   . PONV (postoperative nausea and vomiting)   . Ptosis of both eyelids   . Vertigo   . Wears glasses    reading    Past Surgical History:  Procedure Laterality Date  . ABDOMINAL HYSTERECTOMY  1986  . APPLICATION OF ROBOTIC ASSISTANCE FOR SPINAL PROCEDURE N/A 07/17/2017   Procedure: APPLICATION OF ROBOTIC ASSISTANCE FOR SPINAL PROCEDURE;  Surgeon: Kristeen Miss, MD;  Location: [place];  Service: Neurosurgery;  Laterality: N/A;  . APPLICATION OF ROBOTIC ASSISTANCE FOR SPINAL PROCEDURE N/A 09/01/2017   Procedure: APPLICATION OF ROBOTIC ASSISTANCE FOR SPINAL PROCEDURE;  Surgeon: Kristeen Miss, MD;  Location: [place];  Service: Neurosurgery;  Laterality: N/A;  . [place]  2000   right  . CARPAL TUNNEL RELEASE  2012   left  . COLONOSCOPY    . CYSTOCELE REPAIR  2007   sling  . DISTAL INTERPHALANGEAL JOINT FUSION Right 02/04/2014   Procedure: FUSION DISTAL INTERPHALANGEAL JOINT RIGHT INDEX FINGER ;  Surgeon: Daryll Brod, MD;  Location: [place];  Service: Orthopedics;  Laterality: Right;  . EYE SURGERY Bilateral    Cataract surgery with lens implant  . KNEE  SURGERY  2009,2011   partial knee  . OPEN REDUCTION INTERNAL FIXATION (ORIF) PROXIMAL PHALANX Left 08/30/2013   Procedure: OPEN REDUCTION INTERNAL FIXATION (ORIF) PROXIMAL PHALANX FRACTURE LEFT SMALL FINGER; SPLINT RING FINGER;  Surgeon: Wynonia Sours, MD;  Location: [place];  Service: Orthopedics;  Laterality: Left;  . POSTERIOR LUMBAR FUSION 4 LEVEL N/A 07/17/2017   Procedure: Thoracic Ten - Lumbar Five revison of hardware with Mazor;  Surgeon: Kristeen Miss, MD;  Location: [place];  Service: Neurosurgery;  Laterality: N/A;  Thoracic/Lumbar  . PTOSIS REPAIR Bilateral 07/27/2015   Procedure: PTOSIS REPAIR;  Surgeon: Cristine Polio, MD;  Location: [place];  Service: Plastics;  Laterality: Bilateral;  . SHOULDER SURGERY     rt rcr,and lt  . TONSILLECTOMY      There were no vitals filed for this visit.  Subjective Assessment - 10/12/17 1435    Subjective  Patient reports that she is very tired and sore, reports that she did her exercises and just felt really fatigued    Currently in Pain?  Yes    Pain Score  6     Pain Location  Back    Pain Orientation  Lower    Aggravating Factors   activity  [place] Adult PT Treatment/Exercise - 10/12/17 0001      Ambulation/Gait   Gait Comments  use a SPC and HHA, slow gait with cues to decrease the trunk lean and the limp, c/o back weakness, left leg c/o weakness, right leg c/o pain, to front door and back 240'      Lumbar Exercises: Aerobic   Nustep  level 4 x 6 minutes      Lumbar Exercises: Machines for Strengthening   Cybex Lumbar Extension  black tband x 15    Cybex Knee Extension  20# 2x15    Cybex Knee Flexion  5# 2x10    Other Lumbar Machine Exercise  triceps 15#, seated row 15#, lats 15# all 2x15    Other Lumbar Machine Exercise  chest press 5# 2x15      Lumbar Exercises: Supine   Other Supine Lumbar Exercises  feet on ball K2C, small bridge and abdominal isometric       Modalities   Modalities  Electrical Stimulation      Electrical Stimulation   Electrical Stimulation Location  lumbar area    Electrical Stimulation Action  IFC    Electrical Stimulation Parameters  supine    Electrical Stimulation Goals  Pain               PT Short Term Goals - 10/10/17 1525      PT SHORT TERM GOAL #1   Title  independent with initial HEP    Status  Partially Met        PT Long Term Goals - 09/27/17 1138      PT LONG TERM GOAL #1   Title  decrease pain 50%    Time  8    Period  Weeks    Status  New      PT LONG TERM GOAL #2   Title  increase Lumbar ROM 25%    Time  8    Period  Weeks    Status  New      PT LONG TERM GOAL #3   Title  increase LE strength to 4+/5    Time  8    Period  Weeks    Status  New      PT LONG TERM GOAL #4   Title  go up and down stairs step over step    Time  8    Period  Weeks      PT LONG TERM GOAL #5   Title  walk 600 feet without assistive device    Time  8    Period  Weeks    Status  New            Plan - 10/12/17 1509    Clinical Impression Statement  Very good increase in exercise without c/o pain, did well with the [facility] and the HHA walking.  Needs cues to slow down and get good contraction.    PT Next Visit Plan  Slowly progress with strength and function, go slow due to complicated and multiple surgeries    Consulted and Agree with Plan of Care  Patient       Patient will benefit from skilled therapeutic intervention in order to improve the following deficits and impairments:  Abnormal gait, Cardiopulmonary status limiting activity, Decreased activity tolerance, Decreased balance, Decreased mobility, Decreased strength, Postural dysfunction, Improper body mechanics, Impaired flexibility, Pain, Increased muscle spasms, Decreased range of motion, Difficulty walking  Visit Diagnosis: Acute bilateral low back pain with bilateral  sciatica  Muscle weakness (generalized)  Difficulty in  walking, not elsewhere classified  Muscle spasm of back     Problem List Patient Active Problem List   Diagnosis Date Noted  . Osteoarthritis of right knee   . Vertigo   . DDD (degenerative disc disease), cervical   . Benign essential HTN   . History of DVT (deep vein thrombosis)   . Tachycardia   . Steroid-induced hyperglycemia   . Hypothyroidism   . Neuropathic pain   . Acute blood loss anemia   . S/P lumbar fusion   . Closed L5 vertebral fracture ([place]) 08/31/2017  . Closed fracture of fifth lumbar vertebra ([place]) 08/30/2017  . Lumbar vertebral fracture ([place]) 07/17/2017  . Scoliosis 06/13/2017    Sumner Boast., PT 10/12/2017, 3:14 PM  [place] [address] [place], [place], [address] Phone: [phone]   Fax:  [phone]  Name: [name] MRN: [id number] Date of Birth: 12-Oct-1932

## 2017-10-16 ENCOUNTER — Encounter: Payer: Self-pay | Admitting: Physical Therapy

## 2017-10-16 ENCOUNTER — Ambulatory Visit: Payer: Medicare Other | Admitting: Physical Therapy

## 2017-10-16 DIAGNOSIS — M5442 Lumbago with sciatica, left side: Secondary | ICD-10-CM | POA: Diagnosis not present

## 2017-10-16 DIAGNOSIS — R262 Difficulty in walking, not elsewhere classified: Secondary | ICD-10-CM

## 2017-10-16 DIAGNOSIS — M6281 Muscle weakness (generalized): Secondary | ICD-10-CM

## 2017-10-16 DIAGNOSIS — M5441 Lumbago with sciatica, right side: Secondary | ICD-10-CM

## 2017-10-16 DIAGNOSIS — M62830 Muscle spasm of back: Secondary | ICD-10-CM

## 2017-10-16 NOTE — Therapy (Signed)
[place], [place], [address] Phone: [phone]   Fax:  [phone]  Physical Therapy Treatment  Patient Details  Name: [name] MRN: [id number] Date of Birth: 1932-06-23 Referring Provider: Ellene Route   Encounter Date: 10/16/2017  PT End of Session - 10/16/17 1137    Visit Number  4    Date for PT Re-Evaluation  11/27/17    PT Start Time  1056    PT Stop Time  1142    PT Time Calculation (min)  46 min    Activity Tolerance  Patient tolerated treatment well    Behavior During Therapy  [facility] for tasks assessed/performed       Past Medical History:  Diagnosis Date  . Arthritis   . GERD (gastroesophageal reflux disease)   . History of blood transfusion   . Hypothyroidism   . PONV (postoperative nausea and vomiting)   . Ptosis of both eyelids   . Vertigo   . Wears glasses    reading    Past Surgical History:  Procedure Laterality Date  . ABDOMINAL HYSTERECTOMY  1986  . APPLICATION OF ROBOTIC ASSISTANCE FOR SPINAL PROCEDURE N/A 07/17/2017   Procedure: APPLICATION OF ROBOTIC ASSISTANCE FOR SPINAL PROCEDURE;  Surgeon: Kristeen Miss, MD;  Location: [place];  Service: Neurosurgery;  Laterality: N/A;  . APPLICATION OF ROBOTIC ASSISTANCE FOR SPINAL PROCEDURE N/A 09/01/2017   Procedure: APPLICATION OF ROBOTIC ASSISTANCE FOR SPINAL PROCEDURE;  Surgeon: Kristeen Miss, MD;  Location: [place];  Service: Neurosurgery;  Laterality: N/A;  . [place]  2000   right  . CARPAL TUNNEL RELEASE  2012   left  . COLONOSCOPY    . CYSTOCELE REPAIR  2007   sling  . DISTAL INTERPHALANGEAL JOINT FUSION Right 02/04/2014   Procedure: FUSION DISTAL INTERPHALANGEAL JOINT RIGHT INDEX FINGER ;  Surgeon: Daryll Brod, MD;  Location: [place];  Service: Orthopedics;  Laterality: Right;  . EYE SURGERY Bilateral    Cataract surgery with lens implant  . KNEE  SURGERY  2009,2011   partial knee  . OPEN REDUCTION INTERNAL FIXATION (ORIF) PROXIMAL PHALANX Left 08/30/2013   Procedure: OPEN REDUCTION INTERNAL FIXATION (ORIF) PROXIMAL PHALANX FRACTURE LEFT SMALL FINGER; SPLINT RING FINGER;  Surgeon: Wynonia Sours, MD;  Location: [place];  Service: Orthopedics;  Laterality: Left;  . POSTERIOR LUMBAR FUSION 4 LEVEL N/A 07/17/2017   Procedure: Thoracic Ten - Lumbar Five revison of hardware with Mazor;  Surgeon: Kristeen Miss, MD;  Location: [place];  Service: Neurosurgery;  Laterality: N/A;  Thoracic/Lumbar  . PTOSIS REPAIR Bilateral 07/27/2015   Procedure: PTOSIS REPAIR;  Surgeon: Cristine Polio, MD;  Location: [place];  Service: Plastics;  Laterality: Bilateral;  . SHOULDER SURGERY     rt rcr,and lt  . TONSILLECTOMY      There were no vitals filed for this visit.  Subjective Assessment - 10/16/17 1059    Subjective  I was sore after last time, reports that her biggest issue is "can't sleep" reports pain in the back and the knee with lying down.    Currently in Pain?  Yes    Pain Score  6     Pain Location  Back    Pain Orientation  Lower    Aggravating Factors   lying down  [place] Adult PT Treatment/Exercise - 10/16/17 0001      Ambulation/Gait   Gait Comments  use a SPC and HHA, slow gait with cues to decrease the trunk lean and the limp, c/o back weakness, left leg c/o weakness, right leg c/o pain, to front door and back 240'      High Level Balance   High Level Balance Activities  Side stepping      Lumbar Exercises: Aerobic   Nustep  level 4 x 6 minutes      Lumbar Exercises: Machines for Strengthening   Cybex Lumbar Extension  black tband x 15    Cybex Knee Extension  25# 2x15    Cybex Knee Flexion  5# 2x10    Other Lumbar Machine Exercise  triceps 15#, seated row 20#, lats 20# all 2x15    Other Lumbar Machine Exercise  chest press 5# 2x15      Lumbar Exercises:  Standing   Other Standing Lumbar Exercises  2.5# hip extension and abduction               PT Short Term Goals - 10/16/17 1144      PT SHORT TERM GOAL #1   Title  independent with initial HEP    Status  Achieved        PT Long Term Goals - 10/16/17 1144      PT LONG TERM GOAL #1   Title  decrease pain 50%    Status  On-going      PT LONG TERM GOAL #3   Title  increase LE strength to 4+/5    Status  On-going            Plan - 10/16/17 1138    Clinical Impression Statement  Patient continues to need core and hip strength.  When trying to walk with a cane she tends to really "wobble" which I worry could cause more strain on the back.  She has been able to tolerate the addition of exercises well the last few visits she does get sore.    PT Next Visit Plan  Slowly progress with strength and function, go slow due to complicated and multiple surgeries    Consulted and Agree with Plan of Care  Patient       Patient will benefit from skilled therapeutic intervention in order to improve the following deficits and impairments:  Abnormal gait, Cardiopulmonary status limiting activity, Decreased activity tolerance, Decreased balance, Decreased mobility, Decreased strength, Postural dysfunction, Improper body mechanics, Impaired flexibility, Pain, Increased muscle spasms, Decreased range of motion, Difficulty walking  Visit Diagnosis: Acute bilateral low back pain with bilateral sciatica  Muscle weakness (generalized)  Difficulty in walking, not elsewhere classified  Muscle spasm of back     Problem List Patient Active Problem List   Diagnosis Date Noted  . Osteoarthritis of right knee   . Vertigo   . DDD (degenerative disc disease), cervical   . Benign essential HTN   . History of DVT (deep vein thrombosis)   . Tachycardia   . Steroid-induced hyperglycemia   . Hypothyroidism   . Neuropathic pain   . Acute blood loss anemia   . S/P lumbar fusion   . Closed  L5 vertebral fracture ([place]) 08/31/2017  . Closed fracture of fifth lumbar vertebra ([place]) 08/30/2017  . Lumbar vertebral fracture ([place]) 07/17/2017  . Scoliosis 06/13/2017    Sumner Boast., PT 10/16/2017, 11:45 AM  [facility] [address] [place]  [place], [place], [address] Phone: [phone]   Fax:  [phone]  Name: [name] MRN: [id number] Date of Birth: 06/25/1932

## 2017-10-19 ENCOUNTER — Encounter: Payer: Self-pay | Admitting: Physical Therapy

## 2017-10-19 ENCOUNTER — Ambulatory Visit: Payer: Medicare Other | Admitting: Physical Therapy

## 2017-10-19 DIAGNOSIS — M5442 Lumbago with sciatica, left side: Secondary | ICD-10-CM | POA: Diagnosis not present

## 2017-10-19 DIAGNOSIS — M6281 Muscle weakness (generalized): Secondary | ICD-10-CM

## 2017-10-19 DIAGNOSIS — R262 Difficulty in walking, not elsewhere classified: Secondary | ICD-10-CM

## 2017-10-19 DIAGNOSIS — M62830 Muscle spasm of back: Secondary | ICD-10-CM

## 2017-10-19 DIAGNOSIS — M5441 Lumbago with sciatica, right side: Secondary | ICD-10-CM

## 2017-10-19 NOTE — Therapy (Signed)
[place], [place], [address] Phone: [phone]   Fax:  [phone]  Physical Therapy Treatment  Patient Details  Name: [name] MRN: [id number] Date of Birth: September 08, 1932 Referring Provider: Ellene Route   Encounter Date: 10/19/2017  PT End of Session - 10/19/17 1353    Visit Number  5    Date for PT Re-Evaluation  11/27/17    PT Start Time  1308    PT Stop Time  1410    PT Time Calculation (min)  62 min    Activity Tolerance  Patient tolerated treatment well    Behavior During Therapy  [facility] for tasks assessed/performed       Past Medical History:  Diagnosis Date  . Arthritis   . GERD (gastroesophageal reflux disease)   . History of blood transfusion   . Hypothyroidism   . PONV (postoperative nausea and vomiting)   . Ptosis of both eyelids   . Vertigo   . Wears glasses    reading    Past Surgical History:  Procedure Laterality Date  . ABDOMINAL HYSTERECTOMY  1986  . APPLICATION OF ROBOTIC ASSISTANCE FOR SPINAL PROCEDURE N/A 07/17/2017   Procedure: APPLICATION OF ROBOTIC ASSISTANCE FOR SPINAL PROCEDURE;  Surgeon: Kristeen Miss, MD;  Location: [place];  Service: Neurosurgery;  Laterality: N/A;  . APPLICATION OF ROBOTIC ASSISTANCE FOR SPINAL PROCEDURE N/A 09/01/2017   Procedure: APPLICATION OF ROBOTIC ASSISTANCE FOR SPINAL PROCEDURE;  Surgeon: Kristeen Miss, MD;  Location: [place];  Service: Neurosurgery;  Laterality: N/A;  . [place]  2000   right  . CARPAL TUNNEL RELEASE  2012   left  . COLONOSCOPY    . CYSTOCELE REPAIR  2007   sling  . DISTAL INTERPHALANGEAL JOINT FUSION Right 02/04/2014   Procedure: FUSION DISTAL INTERPHALANGEAL JOINT RIGHT INDEX FINGER ;  Surgeon: Daryll Brod, MD;  Location: [place];  Service: Orthopedics;  Laterality: Right;  . EYE SURGERY Bilateral    Cataract surgery with lens implant  . KNEE  SURGERY  2009,2011   partial knee  . OPEN REDUCTION INTERNAL FIXATION (ORIF) PROXIMAL PHALANX Left 08/30/2013   Procedure: OPEN REDUCTION INTERNAL FIXATION (ORIF) PROXIMAL PHALANX FRACTURE LEFT SMALL FINGER; SPLINT RING FINGER;  Surgeon: Wynonia Sours, MD;  Location: [place];  Service: Orthopedics;  Laterality: Left;  . POSTERIOR LUMBAR FUSION 4 LEVEL N/A 07/17/2017   Procedure: Thoracic Ten - Lumbar Five revison of hardware with Mazor;  Surgeon: Kristeen Miss, MD;  Location: [facility] [place];  Service: Neurosurgery;  Laterality: N/A;  Thoracic/Lumbar  . PTOSIS REPAIR Bilateral 07/27/2015   Procedure: PTOSIS REPAIR;  Surgeon: Cristine Polio, MD;  Location: [place];  Service: Plastics;  Laterality: Bilateral;  . SHOULDER SURGERY     rt rcr,and lt  . TONSILLECTOMY      There were no vitals filed for this visit.  Subjective Assessment - 10/19/17 1305    Subjective  Patient reports that she has been very busy doing some shopping, she reports that she is tired, she did see the MD yesterday and he was pleased and told her she could stop wearing the back brace    Currently in Pain?  Yes    Pain Score  5     Pain Location  Leg    Pain Orientation  Right;Left    Pain Descriptors / Indicators  Aching    Aggravating Factors   reports being up on her feet the past few days have caused some leg pain                       [facility] Adult PT Treatment/Exercise - 10/19/17 0001      Ambulation/Gait   Gait Comments  use a SPC and HHA, slow gait with cues to decrease the trunk lean and the limp, c/o back weakness, left leg c/o weakness, right leg c/o pain, to front door and back 240'      High Level Balance   High Level Balance Activities  Side stepping;Backward walking;Tandem walking      Lumbar Exercises: Aerobic   Nustep  level 5 x 6 minutes      Lumbar Exercises: Machines for Strengthening   Cybex Lumbar Extension  black tband 2x 15    Cybex Knee Extension   25# 2x15    Cybex Knee Flexion  5# 2x10    Other Lumbar Machine Exercise  triceps 15#, seated row 20#, lats 20# all 2x15      Lumbar Exercises: Standing   Other Standing Lumbar Exercises  2.5# hip extension and abduction added flexion today      Electrical Stimulation   Electrical Stimulation Location  lumbar area    Electrical Stimulation Action  IFC    Electrical Stimulation Parameters  supine    Electrical Stimulation Goals  Pain               PT Short Term Goals - 10/16/17 1144      PT SHORT TERM GOAL #1   Title  independent with initial HEP    Status  Achieved        PT Long Term Goals - 10/16/17 1144      PT LONG TERM GOAL #1   Title  decrease pain 50%    Status  On-going      PT LONG TERM GOAL #3   Title  increase LE strength to 4+/5    Status  On-going            Plan - 10/19/17 1354    Clinical Impression Statement  Patient reports that she is tired but has been very active.  She feels she is walking better and holding better posture.  She still is very weak in the hips and the back.    PT Next Visit Plan  Slowly progress with strength and function, go slow due to complicated and multiple surgeries    Consulted and Agree with Plan of Care  Patient       Patient will benefit from skilled therapeutic intervention in order to improve the following deficits and impairments:  Abnormal gait, Cardiopulmonary status limiting activity, Decreased activity tolerance, Decreased balance, Decreased mobility, Decreased strength, Postural dysfunction, Improper body mechanics, Impaired flexibility, Pain, Increased muscle spasms, Decreased range of motion, Difficulty walking  Visit Diagnosis: Acute bilateral low back pain with bilateral sciatica  Muscle weakness (generalized)  Difficulty in walking, not elsewhere classified  Muscle spasm of back     Problem List Patient Active Problem List   Diagnosis Date Noted  . Osteoarthritis of right knee   .  Vertigo   . DDD (degenerative disc disease), cervical   . Benign essential HTN   . History of DVT (deep vein thrombosis)   . Tachycardia   . Steroid-induced hyperglycemia   . Hypothyroidism   . Neuropathic pain   .  Acute blood loss anemia   . S/P lumbar fusion   . Closed L5 vertebral fracture ([place]) 08/31/2017  . Closed fracture of fifth lumbar vertebra ([place]) 08/30/2017  . Lumbar vertebral fracture ([place]) 07/17/2017  . Scoliosis 06/13/2017    Sumner Boast., PT 10/19/2017, 1:55 PM  [place] [address] [place], [place], [address] Phone: [phone]   Fax:  [phone]  Name: [name] MRN: [id number] Date of Birth: Oct 01, 1932

## 2017-10-23 ENCOUNTER — Ambulatory Visit: Payer: Medicare Other | Admitting: Physical Therapy

## 2017-10-23 ENCOUNTER — Encounter: Payer: Self-pay | Admitting: Physical Therapy

## 2017-10-23 DIAGNOSIS — M5441 Lumbago with sciatica, right side: Secondary | ICD-10-CM

## 2017-10-23 DIAGNOSIS — M62830 Muscle spasm of back: Secondary | ICD-10-CM

## 2017-10-23 DIAGNOSIS — M5442 Lumbago with sciatica, left side: Principal | ICD-10-CM

## 2017-10-23 DIAGNOSIS — R262 Difficulty in walking, not elsewhere classified: Secondary | ICD-10-CM

## 2017-10-23 DIAGNOSIS — M6281 Muscle weakness (generalized): Secondary | ICD-10-CM

## 2017-10-23 NOTE — Therapy (Signed)
[place], [place], [address] Phone: [phone]   Fax:  [phone]  Physical Therapy Treatment  Patient Details  Name: [name] MRN: [id number] Date of Birth: 08-30-1932 Referring Provider: Ellene Route   Encounter Date: 10/23/2017  PT End of Session - 10/23/17 1147    Visit Number  6    Date for PT Re-Evaluation  11/27/17    PT Start Time  1057    PT Stop Time  1155    PT Time Calculation (min)  58 min    Activity Tolerance  Patient tolerated treatment well    Behavior During Therapy  [facility] for tasks assessed/performed       Past Medical History:  Diagnosis Date  . Arthritis   . GERD (gastroesophageal reflux disease)   . History of blood transfusion   . Hypothyroidism   . PONV (postoperative nausea and vomiting)   . Ptosis of both eyelids   . Vertigo   . Wears glasses    reading    Past Surgical History:  Procedure Laterality Date  . ABDOMINAL HYSTERECTOMY  1986  . APPLICATION OF ROBOTIC ASSISTANCE FOR SPINAL PROCEDURE N/A 07/17/2017   Procedure: APPLICATION OF ROBOTIC ASSISTANCE FOR SPINAL PROCEDURE;  Surgeon: Kristeen Miss, MD;  Location: [place];  Service: Neurosurgery;  Laterality: N/A;  . APPLICATION OF ROBOTIC ASSISTANCE FOR SPINAL PROCEDURE N/A 09/01/2017   Procedure: APPLICATION OF ROBOTIC ASSISTANCE FOR SPINAL PROCEDURE;  Surgeon: Kristeen Miss, MD;  Location: [place];  Service: Neurosurgery;  Laterality: N/A;  . [place]  2000   right  . CARPAL TUNNEL RELEASE  2012   left  . COLONOSCOPY    . CYSTOCELE REPAIR  2007   sling  . DISTAL INTERPHALANGEAL JOINT FUSION Right 02/04/2014   Procedure: FUSION DISTAL INTERPHALANGEAL JOINT RIGHT INDEX FINGER ;  Surgeon: Daryll Brod, MD;  Location: [place];  Service: Orthopedics;  Laterality: Right;  . EYE SURGERY Bilateral    Cataract surgery with lens implant  . KNEE  SURGERY  2009,2011   partial knee  . OPEN REDUCTION INTERNAL FIXATION (ORIF) PROXIMAL PHALANX Left 08/30/2013   Procedure: OPEN REDUCTION INTERNAL FIXATION (ORIF) PROXIMAL PHALANX FRACTURE LEFT SMALL FINGER; SPLINT RING FINGER;  Surgeon: Wynonia Sours, MD;  Location: [place];  Service: Orthopedics;  Laterality: Left;  . POSTERIOR LUMBAR FUSION 4 LEVEL N/A 07/17/2017   Procedure: Thoracic Ten - Lumbar Five revison of hardware with Mazor;  Surgeon: Kristeen Miss, MD;  Location: [facility];  Service: Neurosurgery;  Laterality: N/A;  Thoracic/Lumbar  . PTOSIS REPAIR Bilateral 07/27/2015   Procedure: PTOSIS REPAIR;  Surgeon: Cristine Polio, MD;  Location: [place];  Service: Plastics;  Laterality: Bilateral;  . SHOULDER SURGERY     rt rcr,and lt  . TONSILLECTOMY      There were no vitals filed for this visit.  Subjective Assessment - 10/23/17 1056    Subjective  Patient reports pain is worse at night and reports that she is having difficulty sleeping due to pain in the irght hip.    Currently in Pain?  Yes    Pain Score  7     Pain Location  Hip    Pain Orientation  Right    Pain Descriptors / Indicators  Sore    Aggravating Factors   worse at night  [facility] Adult PT Treatment/Exercise - 10/23/17 0001      Ambulation/Gait   Gait Comments  ambulate to the front door and back with SPC and light HHA, she is doing better with posture and less trendelenberg, still fatigues and starts to lean forward as she tires      Lumbar Exercises: Stretches   ITB Stretch  5 reps;10 seconds;Limitations    ITB Stretch Limitations  showed her how to do at home      Lumbar Exercises: Aerobic   Recumbent Bike  level 0 6 minutes      Lumbar Exercises: Machines for Strengthening   Cybex Lumbar Extension  black tband 2x 15    Leg Press  40# 3x10    Other Lumbar Machine Exercise  triceps 15#, seated row 25#, lats 25# all 2x15    Other Lumbar  Machine Exercise  chest press 5# 2x15      Lumbar Exercises: Standing   Other Standing Lumbar Exercises  2.5# hip flexion, abduction and extension      Lumbar Exercises: Supine   Other Supine Lumbar Exercises  feet on ball K2C, small bridge and abdominal isometric.  Patient wanted to do these at home, she needed a lot of cues to rememeber and how to perform especially the abs      Modalities   Modalities  Electrical engineer Stimulation Location  right hip area    Electrical Stimulation Action  IFC    Electrical Stimulation Parameters  supine    Electrical Stimulation Goals  Pain               PT Short Term Goals - 10/16/17 1144      PT SHORT TERM GOAL #1   Title  independent with initial HEP    Status  Achieved        PT Long Term Goals - 10/23/17 1148      PT LONG TERM GOAL #2   Title  increase Lumbar ROM 25%    Status  On-going      PT LONG TERM GOAL #3   Title  increase LE strength to 4+/5    Status  On-going      PT LONG TERM GOAL #4   Title  go up and down stairs step over step    Status  On-going      PT LONG TERM GOAL #5   Title  walk 600 feet without assistive device    Status  On-going            Plan - 10/23/17 1147    Clinical Impression Statement  Patient with increased c/o right hip pain, she is very tight with piriformis stretch.  she is very tight in the quad and hip flexor as well.    PT Next Visit Plan  Slowly progress with strength and function, go slow due to complicated and multiple surgeries    Consulted and Agree with Plan of Care  Patient       Patient will benefit from skilled therapeutic intervention in order to improve the following deficits and impairments:  Abnormal gait, Cardiopulmonary status limiting activity, Decreased activity tolerance, Decreased balance, Decreased mobility, Decreased strength, Postural dysfunction, Improper body mechanics, Impaired flexibility, Pain,  Increased muscle spasms, Decreased range of motion, Difficulty walking  Visit Diagnosis: Acute bilateral low back pain with bilateral sciatica  Muscle weakness (generalized)  Difficulty in walking, not elsewhere classified  Muscle spasm  of back     Problem List Patient Active Problem List   Diagnosis Date Noted  . Osteoarthritis of right knee   . Vertigo   . DDD (degenerative disc disease), cervical   . Benign essential HTN   . History of DVT (deep vein thrombosis)   . Tachycardia   . Steroid-induced hyperglycemia   . Hypothyroidism   . Neuropathic pain   . Acute blood loss anemia   . S/P lumbar fusion   . Closed L5 vertebral fracture ([place]) 08/31/2017  . Closed fracture of fifth lumbar vertebra ([place]) 08/30/2017  . Lumbar vertebral fracture ([place]) 07/17/2017  . Scoliosis 06/13/2017    Sumner Boast., PT 10/23/2017, 11:49 AM  [place] [address] [place], [place], [address] Phone: [phone]   Fax:  [phone]  Name: [name] MRN: [id number] Date of Birth: 1932-07-11

## 2017-10-25 ENCOUNTER — Encounter: Payer: Self-pay | Admitting: Physical Therapy

## 2017-10-25 ENCOUNTER — Ambulatory Visit: Payer: Medicare Other | Admitting: Physical Therapy

## 2017-10-25 DIAGNOSIS — M5442 Lumbago with sciatica, left side: Secondary | ICD-10-CM | POA: Diagnosis not present

## 2017-10-25 DIAGNOSIS — M6281 Muscle weakness (generalized): Secondary | ICD-10-CM

## 2017-10-25 DIAGNOSIS — M62830 Muscle spasm of back: Secondary | ICD-10-CM

## 2017-10-25 DIAGNOSIS — M5441 Lumbago with sciatica, right side: Secondary | ICD-10-CM

## 2017-10-25 DIAGNOSIS — R262 Difficulty in walking, not elsewhere classified: Secondary | ICD-10-CM

## 2017-10-25 NOTE — Therapy (Signed)
[place], [place], [address] Phone: [phone]   Fax:  [phone]  Physical Therapy Treatment  Patient Details  Name: [name] MRN: [id number] Date of Birth: 1932/08/29 Referring Provider: Ellene Route   Encounter Date: 10/25/2017  PT End of Session - 10/25/17 1221    Visit Number  7    Date for PT Re-Evaluation  11/27/17    PT Start Time  1014    PT Stop Time  1116    PT Time Calculation (min)  62 min    Activity Tolerance  Patient tolerated treatment well    Behavior During Therapy  [facility] for tasks assessed/performed       Past Medical History:  Diagnosis Date  . Arthritis   . GERD (gastroesophageal reflux disease)   . History of blood transfusion   . Hypothyroidism   . PONV (postoperative nausea and vomiting)   . Ptosis of both eyelids   . Vertigo   . Wears glasses    reading    Past Surgical History:  Procedure Laterality Date  . ABDOMINAL HYSTERECTOMY  1986  . APPLICATION OF ROBOTIC ASSISTANCE FOR SPINAL PROCEDURE N/A 07/17/2017   Procedure: APPLICATION OF ROBOTIC ASSISTANCE FOR SPINAL PROCEDURE;  Surgeon: Kristeen Miss, MD;  Location: [place];  Service: Neurosurgery;  Laterality: N/A;  . APPLICATION OF ROBOTIC ASSISTANCE FOR SPINAL PROCEDURE N/A 09/01/2017   Procedure: APPLICATION OF ROBOTIC ASSISTANCE FOR SPINAL PROCEDURE;  Surgeon: Kristeen Miss, MD;  Location: [place];  Service: Neurosurgery;  Laterality: N/A;  . [place]  2000   right  . CARPAL TUNNEL RELEASE  2012   left  . COLONOSCOPY    . CYSTOCELE REPAIR  2007   sling  . DISTAL INTERPHALANGEAL JOINT FUSION Right 02/04/2014   Procedure: FUSION DISTAL INTERPHALANGEAL JOINT RIGHT INDEX FINGER ;  Surgeon: Daryll Brod, MD;  Location: [place];  Service: Orthopedics;  Laterality: Right;  . EYE SURGERY Bilateral    Cataract surgery with lens implant  . KNEE  SURGERY  2009,2011   partial knee  . OPEN REDUCTION INTERNAL FIXATION (ORIF) PROXIMAL PHALANX Left 08/30/2013   Procedure: OPEN REDUCTION INTERNAL FIXATION (ORIF) PROXIMAL PHALANX FRACTURE LEFT SMALL FINGER; SPLINT RING FINGER;  Surgeon: Wynonia Sours, MD;  Location: [place];  Service: Orthopedics;  Laterality: Left;  . POSTERIOR LUMBAR FUSION 4 LEVEL N/A 07/17/2017   Procedure: Thoracic Ten - Lumbar Five revison of hardware with Mazor;  Surgeon: Kristeen Miss, MD;  Location: [facility] [place];  Service: Neurosurgery;  Laterality: N/A;  Thoracic/Lumbar  . PTOSIS REPAIR Bilateral 07/27/2015   Procedure: PTOSIS REPAIR;  Surgeon: Cristine Polio, MD;  Location: [place];  Service: Plastics;  Laterality: Bilateral;  . SHOULDER SURGERY     rt rcr,and lt  . TONSILLECTOMY      There were no vitals filed for this visit.  Subjective Assessment - 10/25/17 1017    Subjective  Reports that yesterday she just really felt "wore out", reports after PT she went shopping and just really tired with some pain    Currently in Pain?  Yes    Pain Score  6     Pain Location  Back    Pain Orientation  Right;Lower                       [facility]  Adult PT Treatment/Exercise - 10/25/17 0001      Ambulation/Gait   Gait Comments  ambulate to the front door and back with SPC and light HHA, she is doing better with posture and less trendelenberg, still fatigues and starts to lean forward as she tires      Lumbar Exercises: Aerobic   Recumbent Bike  level 0and level 1 5 minutes    Nustep  level 5 x 6 minutes      Lumbar Exercises: Machines for Strengthening   Cybex Knee Extension  35# 2x15    Leg Press  40# 3x10    Other Lumbar Machine Exercise  triceps 15#, seated row 25#, lats 25# all 2x15    Other Lumbar Machine Exercise  chest press 5# 2x15      Lumbar Exercises: Supine   Other Supine Lumbar Exercises  feet on ball K2C, small bridge and abdominal isometric.  Patient wanted  to do these at home, she needed a lot of cues to rememeber and how to perform especially the abs      Lumbar Exercises: Sidelying   Clam  Both;20 reps;1 second    Hip Abduction  Both;20 reps;Limitations    Hip Abduction Limitations  unable on right      Electrical Stimulation   Electrical Stimulation Location  right hip area    Electrical Stimulation Action  IFC    Electrical Stimulation Parameters  supine    Electrical Stimulation Goals  Pain               PT Short Term Goals - 10/16/17 1144      PT SHORT TERM GOAL #1   Title  independent with initial HEP    Status  Achieved        PT Long Term Goals - 10/25/17 1223      PT LONG TERM GOAL #1   Title  decrease pain 50%    Status  On-going      PT LONG TERM GOAL #4   Title  go up and down stairs step over step    Status  On-going            Plan - 10/25/17 1222    Clinical Impression Statement  Patient with very weak right hip abduction, on side unable to really lift the right leg    PT Next Visit Plan  Slowly progress with strength and function, go slow due to complicated and multiple surgeries    Consulted and Agree with Plan of Care  Patient       Patient will benefit from skilled therapeutic intervention in order to improve the following deficits and impairments:  Abnormal gait, Cardiopulmonary status limiting activity, Decreased activity tolerance, Decreased balance, Decreased mobility, Decreased strength, Postural dysfunction, Improper body mechanics, Impaired flexibility, Pain, Increased muscle spasms, Decreased range of motion, Difficulty walking  Visit Diagnosis: Acute bilateral low back pain with bilateral sciatica  Muscle weakness (generalized)  Difficulty in walking, not elsewhere classified  Muscle spasm of back     Problem List Patient Active Problem List   Diagnosis Date Noted  . Osteoarthritis of right knee   . Vertigo   . DDD (degenerative disc disease), cervical   . Benign  essential HTN   . History of DVT (deep vein thrombosis)   . Tachycardia   . Steroid-induced hyperglycemia   . Hypothyroidism   . Neuropathic pain   . Acute blood loss anemia   . S/P lumbar fusion   .  Closed L5 vertebral fracture ([place]) 08/31/2017  . Closed fracture of fifth lumbar vertebra ([place]) 08/30/2017  . Lumbar vertebral fracture ([place]) 07/17/2017  . Scoliosis 06/13/2017    Sumner Boast., PT 10/25/2017, 12:24 PM  [place] [address] [place], [place], [address] Phone: [phone]   Fax:  [phone]  Name: ALEXANDRA LIPPS MRN: [id number] Date of Birth: 1932/11/30

## 2017-10-30 ENCOUNTER — Ambulatory Visit: Payer: Medicare Other | Admitting: Physical Therapy

## 2017-10-30 ENCOUNTER — Encounter: Payer: Self-pay | Admitting: Physical Therapy

## 2017-10-30 DIAGNOSIS — M62830 Muscle spasm of back: Secondary | ICD-10-CM

## 2017-10-30 DIAGNOSIS — M5442 Lumbago with sciatica, left side: Secondary | ICD-10-CM | POA: Diagnosis not present

## 2017-10-30 DIAGNOSIS — M5441 Lumbago with sciatica, right side: Secondary | ICD-10-CM

## 2017-10-30 DIAGNOSIS — M6281 Muscle weakness (generalized): Secondary | ICD-10-CM

## 2017-10-30 DIAGNOSIS — R262 Difficulty in walking, not elsewhere classified: Secondary | ICD-10-CM

## 2017-10-30 NOTE — Therapy (Signed)
[place], [place], [address] Phone: [phone]   Fax:  [phone]  Physical Therapy Treatment  Patient Details  Name: [name] MRN: [id number] Date of Birth: [birth year] Referring Provider: Ellene Route   Encounter Date: 10/30/2017  PT End of Session - 10/30/17 1053    Visit Number  8    Date for PT Re-Evaluation  11/27/17    PT Start Time  0950    PT Stop Time  1057    PT Time Calculation (min)  67 min    Activity Tolerance  Patient tolerated treatment well    Behavior During Therapy  [facility] for tasks assessed/performed       Past Medical History:  Diagnosis Date  . Arthritis   . GERD (gastroesophageal reflux disease)   . History of blood transfusion   . Hypothyroidism   . PONV (postoperative nausea and vomiting)   . Ptosis of both eyelids   . Vertigo   . Wears glasses    reading    Past Surgical History:  Procedure Laterality Date  . ABDOMINAL HYSTERECTOMY  1986  . APPLICATION OF ROBOTIC ASSISTANCE FOR SPINAL PROCEDURE N/A 07/17/2017   Procedure: APPLICATION OF ROBOTIC ASSISTANCE FOR SPINAL PROCEDURE;  Surgeon: Kristeen Miss, MD;  Location: [place];  Service: Neurosurgery;  Laterality: N/A;  . APPLICATION OF ROBOTIC ASSISTANCE FOR SPINAL PROCEDURE N/A 09/01/2017   Procedure: APPLICATION OF ROBOTIC ASSISTANCE FOR SPINAL PROCEDURE;  Surgeon: Kristeen Miss, MD;  Location: [place];  Service: Neurosurgery;  Laterality: N/A;  . [place]  2000   right  . CARPAL TUNNEL RELEASE  2012   left  . COLONOSCOPY    . CYSTOCELE REPAIR  2007   sling  . DISTAL INTERPHALANGEAL JOINT FUSION Right 02/04/2014   Procedure: FUSION DISTAL INTERPHALANGEAL JOINT RIGHT INDEX FINGER ;  Surgeon: Daryll Brod, MD;  Location: [place];  Service: Orthopedics;  Laterality: Right;  . EYE SURGERY Bilateral    Cataract surgery with lens implant  . KNEE  SURGERY  2009,2011   partial knee  . OPEN REDUCTION INTERNAL FIXATION (ORIF) PROXIMAL PHALANX Left 08/30/2013   Procedure: OPEN REDUCTION INTERNAL FIXATION (ORIF) PROXIMAL PHALANX FRACTURE LEFT SMALL FINGER; SPLINT RING FINGER;  Surgeon: Wynonia Sours, MD;  Location: [place];  Service: Orthopedics;  Laterality: Left;  . POSTERIOR LUMBAR FUSION 4 LEVEL N/A 07/17/2017   Procedure: Thoracic Ten - Lumbar Five revison of hardware with Mazor;  Surgeon: Kristeen Miss, MD;  Location: [place];  Service: Neurosurgery;  Laterality: N/A;  Thoracic/Lumbar  . PTOSIS REPAIR Bilateral 07/27/2015   Procedure: PTOSIS REPAIR;  Surgeon: Cristine Polio, MD;  Location: [place];  Service: Plastics;  Laterality: Bilateral;  . SHOULDER SURGERY     rt rcr,and lt  . TONSILLECTOMY      There were no vitals filed for this visit.  Subjective Assessment - 10/30/17 0952    Subjective  Patient reports difficulty sleeping and with pain in the right lower leg    Currently in Pain?  Yes    Pain Score  6     Pain Location  Back    Pain Radiating Towards  pain in the right lower leg    Aggravating Factors   continues to have pain at night  [place] Adult PT Treatment/Exercise - 10/30/17 0001      Ambulation/Gait   Gait Comments  with SPC, down stairs one at a time, then up hill outside with [facility] and one HHA, she was fatigued but did well with less wobble and limp.      Lumbar Exercises: Aerobic   Recumbent Bike  level 0and level 1 5 minutes    Nustep  level 5 x 6 minutes      Lumbar Exercises: Machines for Strengthening   Cybex Knee Extension  10# 3x10    Cybex Knee Flexion  25# 3x10    Leg Press  30# 3x10      Lumbar Exercises: Standing   Other Standing Lumbar Exercises  3# hip flexion, abduction and extension      Lumbar Exercises: Seated   Other Seated Lumbar Exercises  3 way scapular stabilization and isometric ball squeeze      Lumbar  Exercises: Sidelying   Clam  Both;20 reps;1 second      Electrical Stimulation   Electrical Stimulation Location  right hip area    Electrical Stimulation Action  IFC    Electrical Stimulation Parameters  supine    Electrical Stimulation Goals  Pain               PT Short Term Goals - 10/16/17 1144      PT SHORT TERM GOAL #1   Title  independent with initial HEP    Status  Achieved        PT Long Term Goals - 10/25/17 1223      PT LONG TERM GOAL #1   Title  decrease pain 50%    Status  On-going      PT LONG TERM GOAL #4   Title  go up and down stairs step over step    Status  On-going            Plan - 10/30/17 1053    Clinical Impression Statement  Patient did well with the walk, doing less trendelenberg, I still feel that she uses the cane in the wrong hand due to her right hip being weak, but she feels like she cannot use it in the left hand.  She did fatigue and need to sit with the gait outside.  Very weak right hip abduction    PT Next Visit Plan  Slowly progress with strength and function, go slow due to complicated and multiple surgeries    Consulted and Agree with Plan of Care  Patient       Patient will benefit from skilled therapeutic intervention in order to improve the following deficits and impairments:  Abnormal gait, Cardiopulmonary status limiting activity, Decreased activity tolerance, Decreased balance, Decreased mobility, Decreased strength, Postural dysfunction, Improper body mechanics, Impaired flexibility, Pain, Increased muscle spasms, Decreased range of motion, Difficulty walking  Visit Diagnosis: Acute bilateral low back pain with bilateral sciatica  Muscle weakness (generalized)  Difficulty in walking, not elsewhere classified  Muscle spasm of back     Problem List Patient Active Problem List   Diagnosis Date Noted  . Osteoarthritis of right knee   . Vertigo   . DDD (degenerative disc disease), cervical   . Benign  essential HTN   . History of DVT (deep vein thrombosis)   . Tachycardia   . Steroid-induced hyperglycemia   . Hypothyroidism   . Neuropathic pain   . Acute blood loss anemia   . S/P lumbar fusion   . Closed  L5 vertebral fracture ([place]) 08/31/2017  . Closed fracture of fifth lumbar vertebra ([place]) 08/30/2017  . Lumbar vertebral fracture ([place]) 07/17/2017  . Scoliosis 06/13/2017    Sumner Boast., PT 10/30/2017, 10:55 AM  [place] [address] [place], [place], [address] Phone: [phone]   Fax:  [phone]  Name: [name] MRN: [id number] Date of Birth: [birth year]

## 2017-11-01 ENCOUNTER — Ambulatory Visit: Payer: Medicare Other | Admitting: Physical Therapy

## 2017-11-01 ENCOUNTER — Encounter: Payer: Self-pay | Admitting: Physical Therapy

## 2017-11-01 DIAGNOSIS — M5441 Lumbago with sciatica, right side: Secondary | ICD-10-CM

## 2017-11-01 DIAGNOSIS — M5442 Lumbago with sciatica, left side: Secondary | ICD-10-CM | POA: Diagnosis not present

## 2017-11-01 DIAGNOSIS — M62830 Muscle spasm of back: Secondary | ICD-10-CM

## 2017-11-01 DIAGNOSIS — R262 Difficulty in walking, not elsewhere classified: Secondary | ICD-10-CM

## 2017-11-01 DIAGNOSIS — M6281 Muscle weakness (generalized): Secondary | ICD-10-CM

## 2017-11-01 NOTE — Therapy (Signed)
[name], [place], [address] Phone: [phone]   Fax:  [phone]  Physical Therapy Treatment  Patient Details  Name: [name] MRN: [id number] Date of Birth: 04-Nov-1932 Referring Provider: Ellene Route   Encounter Date: 11/01/2017  PT End of Session - 11/01/17 1018    Visit Number  9    Date for PT Re-Evaluation  11/27/17    PT Start Time  1006    PT Stop Time  1104    PT Time Calculation (min)  58 min    Activity Tolerance  Patient tolerated treatment well    Behavior During Therapy  [facility] for tasks assessed/performed       Past Medical History:  Diagnosis Date  . Arthritis   . GERD (gastroesophageal reflux disease)   . History of blood transfusion   . Hypothyroidism   . PONV (postoperative nausea and vomiting)   . Ptosis of both eyelids   . Vertigo   . Wears glasses    reading    Past Surgical History:  Procedure Laterality Date  . ABDOMINAL HYSTERECTOMY  1986  . APPLICATION OF ROBOTIC ASSISTANCE FOR SPINAL PROCEDURE N/A 07/17/2017   Procedure: APPLICATION OF ROBOTIC ASSISTANCE FOR SPINAL PROCEDURE;  Surgeon: Cynthia Miss, MD;  Location: [place];  Service: Neurosurgery;  Laterality: N/A;  . APPLICATION OF ROBOTIC ASSISTANCE FOR SPINAL PROCEDURE N/A 09/01/2017   Procedure: APPLICATION OF ROBOTIC ASSISTANCE FOR SPINAL PROCEDURE;  Surgeon: Cynthia Miss, MD;  Location: [place];  Service: Neurosurgery;  Laterality: N/A;  . [place]  2000   right  . CARPAL TUNNEL RELEASE  2012   left  . COLONOSCOPY    . CYSTOCELE REPAIR  2007   sling  . DISTAL INTERPHALANGEAL JOINT FUSION Right 02/04/2014   Procedure: FUSION DISTAL INTERPHALANGEAL JOINT RIGHT INDEX FINGER ;  Surgeon: Cynthia Brod, MD;  Location: [place];  Service: Orthopedics;  Laterality: Right;  . EYE SURGERY Bilateral    Cataract surgery with lens implant  . KNEE  SURGERY  2009,2011   partial knee  . OPEN REDUCTION INTERNAL FIXATION (ORIF) PROXIMAL PHALANX Left 08/30/2013   Procedure: OPEN REDUCTION INTERNAL FIXATION (ORIF) PROXIMAL PHALANX FRACTURE LEFT SMALL FINGER; SPLINT RING FINGER;  Surgeon: Cynthia Sours, MD;  Location: [place];  Service: Orthopedics;  Laterality: Left;  . POSTERIOR LUMBAR FUSION 4 LEVEL N/A 07/17/2017   Procedure: Thoracic Ten - Lumbar Five revison of hardware with Mazor;  Surgeon: Cynthia Miss, MD;  Location: [place];  Service: Neurosurgery;  Laterality: N/A;  Thoracic/Lumbar  . PTOSIS REPAIR Bilateral 07/27/2015   Procedure: PTOSIS REPAIR;  Surgeon: Cynthia Polio, MD;  Location: [place];  Service: Plastics;  Laterality: Bilateral;  . SHOULDER SURGERY     rt rcr,and lt  . TONSILLECTOMY      There were no vitals filed for this visit.  Subjective Assessment - 11/01/17 1011    Subjective  Patient reports she has been very sore after the last treatment, she also reports that she has tried to walk more with the cane    Currently in Pain?  Yes    Pain Score  6     Pain Location  Back    Pain Orientation  Right;Lower    Pain Descriptors / Indicators  Sore    Aggravating Factors   walking  [place] Adult PT Treatment/Exercise - 11/01/17 0001      Lumbar Exercises: Stretches   Passive Hamstring Stretch  3 reps;20 seconds    Piriformis Stretch  3 reps;10 seconds      Lumbar Exercises: Aerobic   Recumbent Bike  level 1 x 5 minutes    Nustep  level 5 x 6 minutes      Lumbar Exercises: Machines for Strengthening   Cybex Knee Extension  10# 3x10    Cybex Knee Flexion  25# 3x10    Leg Press  30# 3x10    Other Lumbar Machine Exercise  chest press 5# 2x15      Lumbar Exercises: Standing   Other Standing Lumbar Exercises  3# hip flexion, abduction and extension      Lumbar Exercises: Supine   Other Supine Lumbar Exercises  feet on ball K2C, small bridge and  abdominal isometric.  Patient wanted to do these at home, she needed a lot of cues to rememeber and how to perform especially the abs      Acupuncturist Location  right hip area    Electrical Stimulation Action  IFC    Electrical Stimulation Parameters  supine    Electrical Stimulation Goals  Pain               PT Short Term Goals - 10/16/17 1144      PT SHORT TERM GOAL #1   Title  independent with initial HEP    Status  Achieved        PT Long Term Goals - 11/01/17 1019      PT LONG TERM GOAL #1   Title  decrease pain 50%    Status  On-going      PT LONG TERM GOAL #2   Title  increase Lumbar ROM 25%    Status  On-going      PT LONG TERM GOAL #3   Title  increase LE strength to 4+/5    Status  On-going      PT LONG TERM GOAL #4   Title  go up and down stairs step over step    Status  Partially Met      PT LONG TERM GOAL #5   Title  walk 600 feet without assistive device    Status  Partially Met            Plan - 11/01/17 1018    Clinical Impression Statement  Patient with some increaesd c/o soreness today, she was a little more "wobbly" with her walking.  The right hip is still the weakest part, having diffiuclty getting it up and over to get on the bike    PT Next Visit Plan  Slowly progress with strength and function, go slow due to complicated and multiple surgeries    Consulted and Agree with Plan of Care  Patient       Patient will benefit from skilled therapeutic intervention in order to improve the following deficits and impairments:  Abnormal gait, Cardiopulmonary status limiting activity, Decreased activity tolerance, Decreased balance, Decreased mobility, Decreased strength, Postural dysfunction, Improper body mechanics, Impaired flexibility, Pain, Increased muscle spasms, Decreased range of motion, Difficulty walking  Visit Diagnosis: Acute bilateral low back pain with bilateral sciatica  Muscle weakness  (generalized)  Difficulty in walking, not elsewhere classified  Muscle spasm of back     Problem List Patient Active Problem List   Diagnosis Date Noted  . Osteoarthritis of right  knee   . Vertigo   . DDD (degenerative disc disease), cervical   . Benign essential HTN   . History of DVT (deep vein thrombosis)   . Tachycardia   . Steroid-induced hyperglycemia   . Hypothyroidism   . Neuropathic pain   . Acute blood loss anemia   . S/P lumbar fusion   . Closed L5 vertebral fracture ([place]) 08/31/2017  . Closed fracture of fifth lumbar vertebra ([place]) 08/30/2017  . Lumbar vertebral fracture ([place]) 07/17/2017  . Scoliosis 06/13/2017    Sumner Boast., PT 11/01/2017, 10:23 AM  [place] [address] [place], [place], [address] Phone: [phone]   Fax:  [phone]  Name: CARMELLIA KREISLER MRN: [id number] Date of Birth: [birth year]

## 2017-11-07 ENCOUNTER — Encounter: Payer: Self-pay | Admitting: Physical Therapy

## 2017-11-07 ENCOUNTER — Ambulatory Visit: Payer: Medicare Other | Attending: Internal Medicine | Admitting: Physical Therapy

## 2017-11-07 DIAGNOSIS — R262 Difficulty in walking, not elsewhere classified: Secondary | ICD-10-CM | POA: Diagnosis present

## 2017-11-07 DIAGNOSIS — M5442 Lumbago with sciatica, left side: Secondary | ICD-10-CM | POA: Insufficient documentation

## 2017-11-07 DIAGNOSIS — M5441 Lumbago with sciatica, right side: Secondary | ICD-10-CM | POA: Insufficient documentation

## 2017-11-07 DIAGNOSIS — M62830 Muscle spasm of back: Secondary | ICD-10-CM | POA: Insufficient documentation

## 2017-11-07 DIAGNOSIS — M6281 Muscle weakness (generalized): Secondary | ICD-10-CM | POA: Diagnosis present

## 2017-11-07 NOTE — Therapy (Signed)
[place], [place], [address] Phone: [phone]   Fax:  [phone]  Physical Therapy Treatment  Patient Details  Name: [name] MRN: [id number] Date of Birth: December 15, 1932 Referring Provider: Ellene Route   Encounter Date: 11/07/2017  PT End of Session - 11/07/17 1522    Visit Number  10    Date for PT Re-Evaluation  11/27/17    PT Start Time  1438    PT Stop Time  1535    PT Time Calculation (min)  57 min    Activity Tolerance  Patient tolerated treatment well    Behavior During Therapy  [facility] for tasks assessed/performed       Past Medical History:  Diagnosis Date  . Arthritis   . GERD (gastroesophageal reflux disease)   . History of blood transfusion   . Hypothyroidism   . PONV (postoperative nausea and vomiting)   . Ptosis of both eyelids   . Vertigo   . Wears glasses    reading    Past Surgical History:  Procedure Laterality Date  . ABDOMINAL HYSTERECTOMY  1986  . APPLICATION OF ROBOTIC ASSISTANCE FOR SPINAL PROCEDURE N/A 07/17/2017   Procedure: APPLICATION OF ROBOTIC ASSISTANCE FOR SPINAL PROCEDURE;  Surgeon: Kristeen Miss, MD;  Location: [place];  Service: Neurosurgery;  Laterality: N/A;  . APPLICATION OF ROBOTIC ASSISTANCE FOR SPINAL PROCEDURE N/A 09/01/2017   Procedure: APPLICATION OF ROBOTIC ASSISTANCE FOR SPINAL PROCEDURE;  Surgeon: Kristeen Miss, MD;  Location: [facility];  Service: Neurosurgery;  Laterality: N/A;  . [place]  2000   right  . CARPAL TUNNEL RELEASE  2012   left  . COLONOSCOPY    . CYSTOCELE REPAIR  2007   sling  . DISTAL INTERPHALANGEAL JOINT FUSION Right 02/04/2014   Procedure: FUSION DISTAL INTERPHALANGEAL JOINT RIGHT INDEX FINGER ;  Surgeon: Daryll Brod, MD;  Location: [place];  Service: Orthopedics;  Laterality: Right;  . EYE SURGERY Bilateral    Cataract surgery with lens implant  . KNEE  SURGERY  2009,2011   partial knee  . OPEN REDUCTION INTERNAL FIXATION (ORIF) PROXIMAL PHALANX Left 08/30/2013   Procedure: OPEN REDUCTION INTERNAL FIXATION (ORIF) PROXIMAL PHALANX FRACTURE LEFT SMALL FINGER; SPLINT RING FINGER;  Surgeon: Wynonia Sours, MD;  Location: [place];  Service: Orthopedics;  Laterality: Left;  . POSTERIOR LUMBAR FUSION 4 LEVEL N/A 07/17/2017   Procedure: Thoracic Ten - Lumbar Five revison of hardware with Mazor;  Surgeon: Kristeen Miss, MD;  Location: [place];  Service: Neurosurgery;  Laterality: N/A;  Thoracic/Lumbar  . PTOSIS REPAIR Bilateral 07/27/2015   Procedure: PTOSIS REPAIR;  Surgeon: Cristine Polio, MD;  Location: [place];  Service: Plastics;  Laterality: Bilateral;  . SHOULDER SURGERY     rt rcr,and lt  . TONSILLECTOMY      There were no vitals filed for this visit.  Subjective Assessment - 11/07/17 1445    Subjective  Patient reports that she is in more pain recently, reports at night a lot of burning in the legs.  She reports that she feels tired and has difficulty sleepng    Currently in Pain?  Yes    Pain Score  6     Pain Location  Back    Pain Orientation  Right;Lower    Pain Descriptors / Indicators  Burning    Pain Radiating Towards  burning in the right leg at night                       [facility] Adult PT Treatment/Exercise - 11/07/17 0001      Ambulation/Gait   Gait Comments  SPC and had her use in the left hand, some cues and close supervision and CGA due to LOB and her not being use to the left hand      Lumbar Exercises: Stretches   Passive Hamstring Stretch  3 reps;20 seconds    Piriformis Stretch  3 reps;10 seconds      Lumbar Exercises: Aerobic   Recumbent Bike  level 1 x 6 minutes    Nustep  level 5 x 6 minutes      Lumbar Exercises: Standing   Other Standing Lumbar Exercises  3# hip flexion, abduction and extension      Lumbar Exercises: Supine   Other Supine Lumbar Exercises   feet on ball K2C, small bridge and abdominal isometric.  Patient wanted to do these at home, she needed a lot of cues to rememeber and how to perform especially the abs, cues needed to go slow and hold 2 seconds      Lumbar Exercises: Sidelying   Clam  Both;20 reps;1 second      Electrical Stimulation   Electrical Stimulation Location  right hip area    Electrical Stimulation Action  IFC    Electrical Stimulation Parameters  supine    Electrical Stimulation Goals  Pain               PT Short Term Goals - 10/16/17 1144      PT SHORT TERM GOAL #1   Title  independent with initial HEP    Status  Achieved        PT Long Term Goals - 11/01/17 1019      PT LONG TERM GOAL #1   Title  decrease pain 50%    Status  On-going      PT LONG TERM GOAL #2   Title  increase Lumbar ROM 25%    Status  On-going      PT LONG TERM GOAL #3   Title  increase LE strength to 4+/5    Status  On-going      PT LONG TERM GOAL #4   Title  go up and down stairs step over step    Status  Partially Met      PT LONG TERM GOAL #5   Title  walk 600 feet without assistive device    Status  Partially Met            Plan - 11/07/17 1523    Clinical Impression Statement  Patient again with increased c/o soreness, went over with her ythe HEP, she seems to be doing "too much" asked her to decrease the reps but do the exercises slower and better form.  She tried the cane in the correct hand (left) today, this was new for her and she was somewhat unsteady, needing close supervision    PT Next Visit Plan  try to get her walking better    Consulted and Agree with Plan of Care  Patient       Patient will benefit from skilled therapeutic intervention in order to improve the following deficits and impairments:  Abnormal gait, Cardiopulmonary status limiting activity, Decreased activity tolerance, Decreased balance, Decreased mobility, Decreased strength, Postural dysfunction, Improper body mechanics,  Impaired flexibility,  Pain, Increased muscle spasms, Decreased range of motion, Difficulty walking  Visit Diagnosis: Acute bilateral low back pain with bilateral sciatica  Muscle weakness (generalized)  Difficulty in walking, not elsewhere classified  Muscle spasm of back     Problem List Patient Active Problem List   Diagnosis Date Noted  . Osteoarthritis of right knee   . Vertigo   . DDD (degenerative disc disease), cervical   . Benign essential HTN   . History of DVT (deep vein thrombosis)   . Tachycardia   . Steroid-induced hyperglycemia   . Hypothyroidism   . Neuropathic pain   . Acute blood loss anemia   . S/P lumbar fusion   . Closed L5 vertebral fracture ([place]) 08/31/2017  . Closed fracture of fifth lumbar vertebra ([place]) 08/30/2017  . Lumbar vertebral fracture ([place]) 07/17/2017  . Scoliosis 06/13/2017    Sumner Boast., PT 11/07/2017, 3:25 PM  [place] [address] [place], [place], [address] Phone: [phone]   Fax:  [phone]  Name: [name] MRN: [id number] Date of Birth: 04/04/1933

## 2017-11-09 ENCOUNTER — Ambulatory Visit: Payer: Medicare Other | Admitting: Physical Therapy

## 2017-11-09 ENCOUNTER — Encounter: Payer: Self-pay | Admitting: Physical Therapy

## 2017-11-09 DIAGNOSIS — M5442 Lumbago with sciatica, left side: Principal | ICD-10-CM

## 2017-11-09 DIAGNOSIS — M62830 Muscle spasm of back: Secondary | ICD-10-CM

## 2017-11-09 DIAGNOSIS — R262 Difficulty in walking, not elsewhere classified: Secondary | ICD-10-CM

## 2017-11-09 DIAGNOSIS — M5441 Lumbago with sciatica, right side: Secondary | ICD-10-CM

## 2017-11-09 DIAGNOSIS — M6281 Muscle weakness (generalized): Secondary | ICD-10-CM

## 2017-11-09 NOTE — Therapy (Signed)
[place], [place], [address] Phone: [phone]   Fax:  [phone]  Physical Therapy Treatment  Patient Details  Name: [name] MRN: [id number] Date of Birth: [birth year] Referring Provider: Ellene Route   Encounter Date: 11/09/2017  PT End of Session - 11/09/17 1445    Visit Number  11    Date for PT Re-Evaluation  11/27/17    PT Start Time  1400    PT Stop Time  1450    PT Time Calculation (min)  50 min    Activity Tolerance  Patient tolerated treatment well    Behavior During Therapy  [facility] for tasks assessed/performed       Past Medical History:  Diagnosis Date  . Arthritis   . GERD (gastroesophageal reflux disease)   . History of blood transfusion   . Hypothyroidism   . PONV (postoperative nausea and vomiting)   . Ptosis of both eyelids   . Vertigo   . Wears glasses    reading    Past Surgical History:  Procedure Laterality Date  . ABDOMINAL HYSTERECTOMY  1986  . APPLICATION OF ROBOTIC ASSISTANCE FOR SPINAL PROCEDURE N/A 07/17/2017   Procedure: APPLICATION OF ROBOTIC ASSISTANCE FOR SPINAL PROCEDURE;  Surgeon: Kristeen Miss, MD;  Location: [place];  Service: Neurosurgery;  Laterality: N/A;  . APPLICATION OF ROBOTIC ASSISTANCE FOR SPINAL PROCEDURE N/A 09/01/2017   Procedure: APPLICATION OF ROBOTIC ASSISTANCE FOR SPINAL PROCEDURE;  Surgeon: Kristeen Miss, MD;  Location: [place];  Service: Neurosurgery;  Laterality: N/A;  . [place]  2000   right  . CARPAL TUNNEL RELEASE  2012   left  . COLONOSCOPY    . CYSTOCELE REPAIR  2007   sling  . DISTAL INTERPHALANGEAL JOINT FUSION Right 02/04/2014   Procedure: FUSION DISTAL INTERPHALANGEAL JOINT RIGHT INDEX FINGER ;  Surgeon: Daryll Brod, MD;  Location: [place];  Service: Orthopedics;  Laterality: Right;  . EYE SURGERY Bilateral    Cataract surgery with lens implant  . KNEE  SURGERY  2009,2011   partial knee  . OPEN REDUCTION INTERNAL FIXATION (ORIF) PROXIMAL PHALANX Left 08/30/2013   Procedure: OPEN REDUCTION INTERNAL FIXATION (ORIF) PROXIMAL PHALANX FRACTURE LEFT SMALL FINGER; SPLINT RING FINGER;  Surgeon: Wynonia Sours, MD;  Location: [place];  Service: Orthopedics;  Laterality: Left;  . POSTERIOR LUMBAR FUSION 4 LEVEL N/A 07/17/2017   Procedure: Thoracic Ten - Lumbar Five revison of hardware with Mazor;  Surgeon: Kristeen Miss, MD;  Location: [place];  Service: Neurosurgery;  Laterality: N/A;  Thoracic/Lumbar  . PTOSIS REPAIR Bilateral 07/27/2015   Procedure: PTOSIS REPAIR;  Surgeon: Cristine Polio, MD;  Location: [place];  Service: Plastics;  Laterality: Bilateral;  . SHOULDER SURGERY     rt rcr,and lt  . TONSILLECTOMY      There were no vitals filed for this visit.  Subjective Assessment - 11/09/17 1407    Subjective  Patient reports that she does pretty well until she lies down she gets "burning" in the legs, reports that she has called the MD office and is changing medications    Currently in Pain?  Yes    Pain Score  6     Pain Location  Back    Pain Descriptors / Indicators  Burning    Aggravating Factors   lying down really causes the feet and  legs to burn                       [facility] Adult PT Treatment/Exercise - 11/09/17 0001      Ambulation/Gait   Gait Comments  SPC and had her use in the left hand, some cues and close supervision and CGA due to LOB and her not being use to the left hand      Lumbar Exercises: Aerobic   Recumbent Bike  level 1 x 6 minutes    Nustep  level 5 x 6 minutes      Lumbar Exercises: Standing   Other Standing Lumbar Exercises  3# hip flexion, abduction and extension      Lumbar Exercises: Supine   Other Supine Lumbar Exercises  feet on ball K2C, small bridge and abdominal isometric.  Patient wanted to do these at home, she needed a lot of cues to rememeber and how  to perform especially the abs, cues needed to go slow and hold 2 seconds      Lumbar Exercises: Sidelying   Clam  Both;20 reps;1 second    Hip Abduction  Both;20 reps;Limitations      Electrical Stimulation   Electrical Stimulation Location  right hip area    Electrical Stimulation Action  IFC    Electrical Stimulation Parameters  supine, put right leg in elevation    Electrical Stimulation Goals  Pain               PT Short Term Goals - 10/16/17 1144      PT SHORT TERM GOAL #1   Title  independent with initial HEP    Status  Achieved        PT Long Term Goals - 11/09/17 1446      PT LONG TERM GOAL #1   Title  decrease pain 50%    Status  On-going      PT LONG TERM GOAL #2   Title  increase Lumbar ROM 25%    Status  On-going      PT LONG TERM GOAL #4   Title  go up and down stairs step over step    Status  Partially Met            Plan - 11/09/17 1445    Clinical Impression Statement  Patient with more c/o burning in the legs at night and causing her not to be able to sleep.  She is not walking as well today, more waddle.    PT Next Visit Plan  try to get her walking better    Consulted and Agree with Plan of Care  Patient       Patient will benefit from skilled therapeutic intervention in order to improve the following deficits and impairments:  Abnormal gait, Cardiopulmonary status limiting activity, Decreased activity tolerance, Decreased balance, Decreased mobility, Decreased strength, Postural dysfunction, Improper body mechanics, Impaired flexibility, Pain, Increased muscle spasms, Decreased range of motion, Difficulty walking  Visit Diagnosis: Acute bilateral low back pain with bilateral sciatica  Muscle weakness (generalized)  Difficulty in walking, not elsewhere classified  Muscle spasm of back     Problem List Patient Active Problem List   Diagnosis Date Noted  . Osteoarthritis of right knee   . Vertigo   . DDD (degenerative disc  disease), cervical   . Benign essential HTN   . History of DVT (deep vein thrombosis)   . Tachycardia   . Steroid-induced hyperglycemia   .  Hypothyroidism   . Neuropathic pain   . Acute blood loss anemia   . S/P lumbar fusion   . Closed L5 vertebral fracture ([place]) 08/31/2017  . Closed fracture of fifth lumbar vertebra ([place]) 08/30/2017  . Lumbar vertebral fracture ([place]) 07/17/2017  . Scoliosis 06/13/2017    Sumner Boast., PT 11/09/2017, 2:47 PM  [place] [address] [place], [place], [address] Phone: [phone]   Fax:  [phone]  Name: [name] MRN: [id number] Date of Birth: 07-13-1932

## 2017-11-14 ENCOUNTER — Encounter: Payer: Self-pay | Admitting: Physical Therapy

## 2017-11-14 ENCOUNTER — Ambulatory Visit: Payer: Medicare Other | Admitting: Physical Therapy

## 2017-11-14 DIAGNOSIS — M62830 Muscle spasm of back: Secondary | ICD-10-CM

## 2017-11-14 DIAGNOSIS — M5442 Lumbago with sciatica, left side: Principal | ICD-10-CM

## 2017-11-14 DIAGNOSIS — R262 Difficulty in walking, not elsewhere classified: Secondary | ICD-10-CM

## 2017-11-14 DIAGNOSIS — M6281 Muscle weakness (generalized): Secondary | ICD-10-CM

## 2017-11-14 DIAGNOSIS — M5441 Lumbago with sciatica, right side: Secondary | ICD-10-CM

## 2017-11-14 NOTE — Therapy (Signed)
[place], [place], [address] Phone: [phone]   Fax:  [phone]  Physical Therapy Treatment  Patient Details  Name: [name] MRN: [id number] Date of Birth: December 04, 1932 Referring Provider: Ellene Route   Encounter Date: 11/14/2017  PT End of Session - 11/14/17 1358    Visit Number  12    Date for PT Re-Evaluation  11/27/17    PT Start Time  1311    PT Stop Time  1414    PT Time Calculation (min)  63 min    Activity Tolerance  Patient tolerated treatment well    Behavior During Therapy  [facility] for tasks assessed/performed       Past Medical History:  Diagnosis Date  . Arthritis   . GERD (gastroesophageal reflux disease)   . History of blood transfusion   . Hypothyroidism   . PONV (postoperative nausea and vomiting)   . Ptosis of both eyelids   . Vertigo   . Wears glasses    reading    Past Surgical History:  Procedure Laterality Date  . ABDOMINAL HYSTERECTOMY  1986  . APPLICATION OF ROBOTIC ASSISTANCE FOR SPINAL PROCEDURE N/A 07/17/2017   Procedure: APPLICATION OF ROBOTIC ASSISTANCE FOR SPINAL PROCEDURE;  Surgeon: Kristeen Miss, MD;  Location: [place];  Service: Neurosurgery;  Laterality: N/A;  . APPLICATION OF ROBOTIC ASSISTANCE FOR SPINAL PROCEDURE N/A 09/01/2017   Procedure: APPLICATION OF ROBOTIC ASSISTANCE FOR SPINAL PROCEDURE;  Surgeon: Kristeen Miss, MD;  Location: [place];  Service: Neurosurgery;  Laterality: N/A;  . [place]  2000   right  . CARPAL TUNNEL RELEASE  2012   left  . COLONOSCOPY    . CYSTOCELE REPAIR  2007   sling  . DISTAL INTERPHALANGEAL JOINT FUSION Right 02/04/2014   Procedure: FUSION DISTAL INTERPHALANGEAL JOINT RIGHT INDEX FINGER ;  Surgeon: Daryll Brod, MD;  Location: [place];  Service: Orthopedics;  Laterality: Right;  . EYE SURGERY Bilateral    Cataract surgery with lens implant  . KNEE  SURGERY  2009,2011   partial knee  . OPEN REDUCTION INTERNAL FIXATION (ORIF) PROXIMAL PHALANX Left 08/30/2013   Procedure: OPEN REDUCTION INTERNAL FIXATION (ORIF) PROXIMAL PHALANX FRACTURE LEFT SMALL FINGER; SPLINT RING FINGER;  Surgeon: Wynonia Sours, MD;  Location: [place];  Service: Orthopedics;  Laterality: Left;  . POSTERIOR LUMBAR FUSION 4 LEVEL N/A 07/17/2017   Procedure: Thoracic Ten - Lumbar Five revison of hardware with Mazor;  Surgeon: Kristeen Miss, MD;  Location: [place];  Service: Neurosurgery;  Laterality: N/A;  Thoracic/Lumbar  . PTOSIS REPAIR Bilateral 07/27/2015   Procedure: PTOSIS REPAIR;  Surgeon: Cristine Polio, MD;  Location: [place];  Service: Plastics;  Laterality: Bilateral;  . SHOULDER SURGERY     rt rcr,and lt  . TONSILLECTOMY      There were no vitals filed for this visit.  Subjective Assessment - 11/14/17 1315    Subjective  Patient reports that over the weekend she started sleeping a little better and reports fott is feeling a little better.  She does reports a fall where her legs gave out last week late    Currently in Pain?  Yes    Pain Score  4     Pain Location  Back    Pain Orientation  Right;Lower    Pain Relieving Factors  pain meds help  [place] Adult PT Treatment/Exercise - 11/14/17 0001      Exercises   Exercises  Lumbar      Lumbar Exercises: Stretches   Passive Hamstring Stretch  3 reps;20 seconds    Piriformis Stretch  3 reps;10 seconds      Lumbar Exercises: Aerobic   Recumbent Bike  level 1 x 6 minutes    Nustep  level 5 x 6 minutes      Lumbar Exercises: Machines for Strengthening   Cybex Knee Extension  15# 3x10 needing some cues to try to go slow and get TKE    Cybex Knee Flexion  35# 3x10    Leg Press  40# 3x10      Lumbar Exercises: Seated   Other Seated Lumbar Exercises  worked on right ankle DF foot down and foot propped up, needed AAROM, also worked some  on Nurse, mental health, and then foot on dyna disc, needing a lot of assist to decreaed hip compensation    Other Seated Lumbar Exercises  yellow tband IR of the hips      Modalities   Modalities  Moist Heat      Moist Heat Therapy   Number Minutes Moist Heat  15 Minutes    Moist Heat Location  Lumbar Spine   right     Electrical Stimulation   Electrical Stimulation Location  right hip/lumbar area    Electrical Stimulation Action  IFC    Electrical Stimulation Parameters  supine    Electrical Stimulation Goals  Pain             PT Education - 11/14/17 1355    Education Details  gave HEP to do toe taps while seated and watching TV, or with the foot up to make the motions easier(gravity decreased )    Person(s) Educated  Patient    Methods  Explanation;Demonstration;Handout    Comprehension  Verbalized understanding       PT Short Term Goals - 10/16/17 1144      PT SHORT TERM GOAL #1   Title  independent with initial HEP    Status  Achieved        PT Long Term Goals - 11/14/17 1401      PT LONG TERM GOAL #1   Title  decrease pain 50%    Status  Partially Met      PT LONG TERM GOAL #2   Title  increase Lumbar ROM 25%    Status  Partially Met      PT LONG TERM GOAL #3   Title  increase LE strength to 4+/5    Status  On-going      PT LONG TERM GOAL #4   Title  go up and down stairs step over step    Status  Partially Met      PT LONG TERM GOAL #5   Title  walk 600 feet without assistive device    Status  On-going            Plan - 11/14/17 1400    Clinical Impression Statement  Patient with the right foot (toe) catching more with walking, more foot drop noted.  Focused some exercises on LE strength and the right ankle DF, gave her HEP to do.  Asked her to try some tennis shoes as well  We did practice her going from gas to brake to help with coordination as she wants to return to driving    PT Next Visit Plan  work  on LE strrength , especially getting right  ankle DF    Consulted and Agree with Plan of Care  Patient       Patient will benefit from skilled therapeutic intervention in order to improve the following deficits and impairments:  Abnormal gait, Cardiopulmonary status limiting activity, Decreased activity tolerance, Decreased balance, Decreased mobility, Decreased strength, Postural dysfunction, Improper body mechanics, Impaired flexibility, Pain, Increased muscle spasms, Decreased range of motion, Difficulty walking  Visit Diagnosis: Acute bilateral low back pain with bilateral sciatica  Muscle weakness (generalized)  Difficulty in walking, not elsewhere classified  Muscle spasm of back     Problem List Patient Active Problem List   Diagnosis Date Noted  . Osteoarthritis of right knee   . Vertigo   . DDD (degenerative disc disease), cervical   . Benign essential HTN   . History of DVT (deep vein thrombosis)   . Tachycardia   . Steroid-induced hyperglycemia   . Hypothyroidism   . Neuropathic pain   . Acute blood loss anemia   . S/P lumbar fusion   . Closed L5 vertebral fracture ([place]) 08/31/2017  . Closed fracture of fifth lumbar vertebra ([place]) 08/30/2017  . Lumbar vertebral fracture ([place]) 07/17/2017  . Scoliosis 06/13/2017    Sumner Boast., PT 11/14/2017, 2:05 PM  [place] [address] [facility] [place], [place], [address] Phone: [phone]   Fax:  [phone]  Name: JONIAH BEDNARSKI MRN: [id number] Date of Birth: Apr 29, 1932

## 2017-11-14 NOTE — Patient Instructions (Signed)
Access Code: [email or web address]  URL: [email or web address]  Date: 11/14/2017  Prepared by: Lum Babe   Exercises  Seated Toe Raise - 10 reps - 3 sets - 2 hold - 2x daily - 7x weekly  Long Sitting Ankle Pumps - 10 reps - 3 sets - 2 hold - 2x daily - 7x weekly

## 2017-11-16 ENCOUNTER — Encounter: Payer: Self-pay | Admitting: Physical Therapy

## 2017-11-16 ENCOUNTER — Ambulatory Visit: Payer: Medicare Other | Admitting: Physical Therapy

## 2017-11-16 DIAGNOSIS — R262 Difficulty in walking, not elsewhere classified: Secondary | ICD-10-CM

## 2017-11-16 DIAGNOSIS — M5442 Lumbago with sciatica, left side: Secondary | ICD-10-CM | POA: Diagnosis not present

## 2017-11-16 DIAGNOSIS — M5441 Lumbago with sciatica, right side: Secondary | ICD-10-CM

## 2017-11-16 DIAGNOSIS — M6281 Muscle weakness (generalized): Secondary | ICD-10-CM

## 2017-11-16 NOTE — Therapy (Signed)
[place], [place], [address] Phone: [phone]   Fax:  [phone]  Physical Therapy Treatment  Patient Details  Name: [name] MRN: [id number] Date of Birth: 11-03-1932 Referring Provider: Ellene Route   Encounter Date: 11/16/2017  PT End of Session - 11/16/17 1347    Visit Number  13    Date for PT Re-Evaluation  11/27/17    PT Start Time  1300    PT Stop Time  2841    PT Time Calculation (min)  63 min       Past Medical History:  Diagnosis Date  . Arthritis   . GERD (gastroesophageal reflux disease)   . History of blood transfusion   . Hypothyroidism   . PONV (postoperative nausea and vomiting)   . Ptosis of both eyelids   . Vertigo   . Wears glasses    reading    Past Surgical History:  Procedure Laterality Date  . ABDOMINAL HYSTERECTOMY  1986  . APPLICATION OF ROBOTIC ASSISTANCE FOR SPINAL PROCEDURE N/A 07/17/2017   Procedure: APPLICATION OF ROBOTIC ASSISTANCE FOR SPINAL PROCEDURE;  Surgeon: Kristeen Miss, MD;  Location: [place];  Service: Neurosurgery;  Laterality: N/A;  . APPLICATION OF ROBOTIC ASSISTANCE FOR SPINAL PROCEDURE N/A 09/01/2017   Procedure: APPLICATION OF ROBOTIC ASSISTANCE FOR SPINAL PROCEDURE;  Surgeon: Kristeen Miss, MD;  Location: [place];  Service: Neurosurgery;  Laterality: N/A;  . [place]  2000   right  . CARPAL TUNNEL RELEASE  2012   left  . COLONOSCOPY    . CYSTOCELE REPAIR  2007   sling  . DISTAL INTERPHALANGEAL JOINT FUSION Right 02/04/2014   Procedure: FUSION DISTAL INTERPHALANGEAL JOINT RIGHT INDEX FINGER ;  Surgeon: Daryll Brod, MD;  Location: [place];  Service: Orthopedics;  Laterality: Right;  . EYE SURGERY Bilateral    Cataract surgery with lens implant  . KNEE SURGERY  2009,2011   partial knee  . OPEN REDUCTION INTERNAL FIXATION (ORIF) PROXIMAL PHALANX Left 08/30/2013    Procedure: OPEN REDUCTION INTERNAL FIXATION (ORIF) PROXIMAL PHALANX FRACTURE LEFT SMALL FINGER; SPLINT RING FINGER;  Surgeon: Wynonia Sours, MD;  Location: [place];  Service: Orthopedics;  Laterality: Left;  . POSTERIOR LUMBAR FUSION 4 LEVEL N/A 07/17/2017   Procedure: Thoracic Ten - Lumbar Five revison of hardware with Mazor;  Surgeon: Kristeen Miss, MD;  Location: [place];  Service: Neurosurgery;  Laterality: N/A;  Thoracic/Lumbar  . PTOSIS REPAIR Bilateral 07/27/2015   Procedure: PTOSIS REPAIR;  Surgeon: Cristine Polio, MD;  Location: [place];  Service: Plastics;  Laterality: Bilateral;  . SHOULDER SURGERY     rt rcr,and lt  . TONSILLECTOMY      There were no vitals filed for this visit.  Subjective Assessment - 11/16/17 1302    Subjective  "I didn't sleep two hours last night, That foot was giving me a fit."    Currently in Pain?  Yes    Pain Score  6     Pain Location  Knee    Pain Orientation  Right                       [facility] Adult PT Treatment/Exercise - 11/16/17 0001      Lumbar Exercises: Stretches   Passive Hamstring Stretch  3 reps;20 seconds    Piriformis Stretch  3 reps;10 seconds      Lumbar Exercises: Aerobic   Recumbent Bike  level 1 x 6 minutes    Nustep  level 5 x 6 minutes      Lumbar Exercises: Machines for Strengthening   Cybex Knee Flexion  35# 3x10    Leg Press  40# 3x10      Lumbar Exercises: Seated   Other Seated Lumbar Exercises  worked on right ankle DF foot down and foot propped up, needed AAROM, also worked some on Nurse, mental health, and then foot on dyna disc, needing a lot of assist to decreaed hip compensation      Modalities   Modalities  Moist Heat      Moist Heat Therapy   Number Minutes Moist Heat  15 Minutes    Moist Heat Location  Lumbar Spine      Electrical Stimulation   Electrical Stimulation Location  Lateral R knee    Electrical Stimulation Action  IFC    Electrical Stimulation  Parameters  supine    Electrical Stimulation Goals  Pain               PT Short Term Goals - 10/16/17 1144      PT SHORT TERM GOAL #1   Title  independent with initial HEP    Status  Achieved        PT Long Term Goals - 11/14/17 1401      PT LONG TERM GOAL #1   Title  decrease pain 50%    Status  Partially Met      PT LONG TERM GOAL #2   Title  increase Lumbar ROM 25%    Status  Partially Met      PT LONG TERM GOAL #3   Title  increase LE strength to 4+/5    Status  On-going      PT LONG TERM GOAL #4   Title  go up and down stairs step over step    Status  Partially Met      PT LONG TERM GOAL #5   Title  walk 600 feet without assistive device    Status  On-going            Plan - 11/16/17 1351    Clinical Impression Statement  Pt with very weak R ankle DF, Pt voiced concern about having som nerve damage. Worked on R ankle PF and DF, did well with resisted PR. Some AAROM needed to perform DF at times, when pressed into DF pt was able hold it in place    Rehab Potential  Good    PT Frequency  2x / week    PT Duration  8 weeks    PT Treatment/Interventions  ADLs/Self Care Home Management;Cryotherapy;Electrical Stimulation;Moist Heat;Gait training;Functional mobility training;Therapeutic activities;Stair training;Therapeutic exercise;Balance training;Neuromuscular re-education;Manual techniques;Patient/family education    PT Next Visit Plan  work on [place] , especially getting right ankle DF       Patient will benefit from skilled therapeutic intervention in order to improve the following deficits and impairments:  Abnormal gait, Cardiopulmonary status limiting activity, Decreased activity tolerance, Decreased balance, Decreased mobility, Decreased strength, Postural dysfunction, Improper body mechanics, Impaired flexibility, Pain, Increased muscle spasms, Decreased range of motion, Difficulty walking  Visit Diagnosis: Muscle weakness  (generalized)  Acute bilateral low back pain with bilateral sciatica  Difficulty in walking, not elsewhere classified     Problem List Patient Active Problem List   Diagnosis Date Noted  . Osteoarthritis  of right knee   . Vertigo   . DDD (degenerative disc disease), cervical   . Benign essential HTN   . History of DVT (deep vein thrombosis)   . Tachycardia   . Steroid-induced hyperglycemia   . Hypothyroidism   . Neuropathic pain   . Acute blood loss anemia   . S/P lumbar fusion   . Closed L5 vertebral fracture ([place]) 08/31/2017  . Closed fracture of fifth lumbar vertebra ([place]) 08/30/2017  . Lumbar vertebral fracture ([place]) 07/17/2017  . Scoliosis 06/13/2017    Scot Jun, PTA 11/16/2017, 1:54 PM  [place] [address] [place] [place], [place], [address] Phone: [phone]   Fax:  [phone]  Name: [name] MRN: [id number] Date of Birth: 31-Dec-1932

## 2017-11-21 ENCOUNTER — Encounter: Payer: Self-pay | Admitting: Physical Therapy

## 2017-11-21 ENCOUNTER — Ambulatory Visit: Payer: Medicare Other | Admitting: Physical Therapy

## 2017-11-21 DIAGNOSIS — R262 Difficulty in walking, not elsewhere classified: Secondary | ICD-10-CM

## 2017-11-21 DIAGNOSIS — M6281 Muscle weakness (generalized): Secondary | ICD-10-CM

## 2017-11-21 DIAGNOSIS — M62830 Muscle spasm of back: Secondary | ICD-10-CM

## 2017-11-21 DIAGNOSIS — M5442 Lumbago with sciatica, left side: Secondary | ICD-10-CM | POA: Diagnosis not present

## 2017-11-21 DIAGNOSIS — M5441 Lumbago with sciatica, right side: Secondary | ICD-10-CM

## 2017-11-21 NOTE — Therapy (Signed)
[place] [place], [place], [address] Phone: [phone]   Fax:  [phone]  Physical Therapy Treatment  Patient Details  Name: [name] MRN: [id number] Date of Birth: May 02, 1932 Referring Provider: Ellene Route   Encounter Date: 11/21/2017  PT End of Session - 11/21/17 1326    Visit Number  14    PT Start Time  9476    PT Stop Time  5465    PT Time Calculation (min)  62 min    Activity Tolerance  Patient tolerated treatment well    Behavior During Therapy  [facility] for tasks assessed/performed       Past Medical History:  Diagnosis Date  . Arthritis   . GERD (gastroesophageal reflux disease)   . History of blood transfusion   . Hypothyroidism   . PONV (postoperative nausea and vomiting)   . Ptosis of both eyelids   . Vertigo   . Wears glasses    reading    Past Surgical History:  Procedure Laterality Date  . ABDOMINAL HYSTERECTOMY  1986  . APPLICATION OF ROBOTIC ASSISTANCE FOR SPINAL PROCEDURE N/A 07/17/2017   Procedure: APPLICATION OF ROBOTIC ASSISTANCE FOR SPINAL PROCEDURE;  Surgeon: Kristeen Miss, MD;  Location: [facility];  Service: Neurosurgery;  Laterality: N/A;  . APPLICATION OF ROBOTIC ASSISTANCE FOR SPINAL PROCEDURE N/A 09/01/2017   Procedure: APPLICATION OF ROBOTIC ASSISTANCE FOR SPINAL PROCEDURE;  Surgeon: Kristeen Miss, MD;  Location: [place];  Service: Neurosurgery;  Laterality: N/A;  . [place]  2000   right  . CARPAL TUNNEL RELEASE  2012   left  . COLONOSCOPY    . CYSTOCELE REPAIR  2007   sling  . DISTAL INTERPHALANGEAL JOINT FUSION Right 02/04/2014   Procedure: FUSION DISTAL INTERPHALANGEAL JOINT RIGHT INDEX FINGER ;  Surgeon: Daryll Brod, MD;  Location: [facility];  Service: Orthopedics;  Laterality: Right;  . EYE SURGERY Bilateral    Cataract surgery with lens implant  . KNEE SURGERY  2009,2011   partial knee  .  OPEN REDUCTION INTERNAL FIXATION (ORIF) PROXIMAL PHALANX Left 08/30/2013   Procedure: OPEN REDUCTION INTERNAL FIXATION (ORIF) PROXIMAL PHALANX FRACTURE LEFT SMALL FINGER; SPLINT RING FINGER;  Surgeon: Wynonia Sours, MD;  Location: [facility];  Service: Orthopedics;  Laterality: Left;  . POSTERIOR LUMBAR FUSION 4 LEVEL N/A 07/17/2017   Procedure: Thoracic Ten - Lumbar Five revison of hardware with Mazor;  Surgeon: Kristeen Miss, MD;  Location: [facility];  Service: Neurosurgery;  Laterality: N/A;  Thoracic/Lumbar  . PTOSIS REPAIR Bilateral 07/27/2015   Procedure: PTOSIS REPAIR;  Surgeon: Cristine Polio, MD;  Location: [facility];  Service: Plastics;  Laterality: Bilateral;  . SHOULDER SURGERY     rt rcr,and lt  . TONSILLECTOMY      There were no vitals filed for this visit.  Subjective Assessment - 11/21/17 1259    Subjective  Patient reports that she will be seeing the MD on Thursday.  She reports that she feels like the back is doing well, every now and then will have pain, reports that she gets fatigued easily and then that makes it hard to walk    Currently in Pain?  Yes    Pain Score  3     Pain Location  Back    Pain Orientation  Lower    Aggravating Factors   has been having  some right knee pain                       [facility] Adult PT Treatment/Exercise - 11/21/17 0001      Ambulation/Gait   Gait Comments  SPC to the front door and back, she is having much less wobble and difficulty with the walking, when she gets tired she does start to have forward trunk lean and increased wobble, she does want PT close by as she does not feel fully safe with SPC gait      Exercises   Exercises  Knee/Hip      Lumbar Exercises: Aerobic   Recumbent Bike  level 1 x 6 minutes    Nustep  level 5 x 6 minutes      Lumbar Exercises: Machines for Strengthening   Cybex Knee Extension  15# 3x10 needing some cues to try to go slow and get TKE    Cybex Knee Flexion   35# 3x10    Leg Press  40# 3x10      Lumbar Exercises: Seated   Other Seated Lumbar Exercises  worked on right ankle DF foot down and foot propped up, needed AAROM, also worked some on Nurse, mental health, and then foot on dyna disc, needing a lot of assist to decreaed hip compensation      Lumbar Exercises: Supine   Other Supine Lumbar Exercises  feet on ball K2C, small bridge and abdominal isometric.  Patient wanted to do these at home, she needed a lot of cues to rememeber and how to perform especially the abs, cues needed to go slow and hold 2 seconds      Knee/Hip Exercises: Standing   Hip Abduction  Both;2 sets;10 reps    Abduction Limitations  2.5#      Modalities   Modalities  Moist Heat;Electrical Stimulation      Moist Heat Therapy   Number Minutes Moist Heat  15 Minutes    Moist Heat Location  Lumbar Spine      Electrical Stimulation   Electrical Stimulation Location  right low back    Electrical Stimulation Action  IFC    Electrical Stimulation Parameters  supine     Electrical Stimulation Goals  Pain               PT Short Term Goals - 10/16/17 1144      PT SHORT TERM GOAL #1   Title  independent with initial HEP    Status  Achieved        PT Long Term Goals - 11/21/17 1339      PT LONG TERM GOAL #1   Title  decrease pain 50%    Status  Partially Met      PT LONG TERM GOAL #2   Title  increase Lumbar ROM 25%    Status  Partially Met      PT LONG TERM GOAL #3   Title  increase LE strength to 4+/5    Status  On-going      PT LONG TERM GOAL #4   Title  go up and down stairs step over step    Status  Partially Met      PT LONG TERM GOAL #5   Title  walk 600 feet without assistive device    Status  On-going            Plan - 11/21/17 1328    Clinical Impression Statement  Patient is very weak  with right ankle DF, and right hip abduction.  She does fatigue quickly and when she does she starts to lean forward and have more of a trendelenberg  gait.  She overall is walking much better, she will still get some right toe drag when she is tired.      PT Frequency  2x / week    PT Duration  4 weeks    PT Treatment/Interventions  ADLs/Self Care Home Management;Cryotherapy;Electrical Stimulation;Moist Heat;Gait training;Functional mobility training;Therapeutic activities;Stair training;Therapeutic exercise;Balance training;Neuromuscular re-education;Manual techniques;Patient/family education    PT Next Visit Plan  continue to work on [place] , especially getting right ankle DF and gait with a SPC    Consulted and Agree with Plan of Care  Patient       Patient will benefit from skilled therapeutic intervention in order to improve the following deficits and impairments:  Abnormal gait, Cardiopulmonary status limiting activity, Decreased activity tolerance, Decreased balance, Decreased mobility, Decreased strength, Postural dysfunction, Improper body mechanics, Impaired flexibility, Pain, Increased muscle spasms, Decreased range of motion, Difficulty walking  Visit Diagnosis: Muscle weakness (generalized)  Acute bilateral low back pain with bilateral sciatica  Difficulty in walking, not elsewhere classified  Muscle spasm of back     Problem List Patient Active Problem List   Diagnosis Date Noted  . Osteoarthritis of right knee   . Vertigo   . DDD (degenerative disc disease), cervical   . Benign essential HTN   . History of DVT (deep vein thrombosis)   . Tachycardia   . Steroid-induced hyperglycemia   . Hypothyroidism   . Neuropathic pain   . Acute blood loss anemia   . S/P lumbar fusion   . Closed L5 vertebral fracture ([place]) 08/31/2017  . Closed fracture of fifth lumbar vertebra ([place]) 08/30/2017  . Lumbar vertebral fracture ([facility]) 07/17/2017  . Scoliosis 06/13/2017    Sumner Boast., PT 11/21/2017, 1:40 PM  [facility] [address] [place],  [place], [address] Phone: [phone]   Fax:  [phone]  Name: [name] MRN: [id number] Date of Birth: May 23, 1932

## 2017-11-23 ENCOUNTER — Encounter: Payer: Self-pay | Admitting: Physical Therapy

## 2017-11-23 ENCOUNTER — Ambulatory Visit: Payer: Medicare Other | Admitting: Physical Therapy

## 2017-11-23 DIAGNOSIS — M6281 Muscle weakness (generalized): Secondary | ICD-10-CM

## 2017-11-23 DIAGNOSIS — M5442 Lumbago with sciatica, left side: Secondary | ICD-10-CM

## 2017-11-23 DIAGNOSIS — M5441 Lumbago with sciatica, right side: Secondary | ICD-10-CM

## 2017-11-23 DIAGNOSIS — R262 Difficulty in walking, not elsewhere classified: Secondary | ICD-10-CM

## 2017-11-23 DIAGNOSIS — M62830 Muscle spasm of back: Secondary | ICD-10-CM

## 2017-11-23 NOTE — Therapy (Signed)
[facility] [address] , [place], [address] Phone: [phone]   Fax:  [phone]  Physical Therapy Treatment  Patient Details  Name: [name] MRN: [id number] Date of Birth: 02/05/1933 Referring Provider: Elsner   Encounter Date: 11/23/2017  PT End of Session - 11/23/17 1450    Visit Number  15    Date for PT Re-Evaluation  11/27/17    PT Start Time  1350    PT Stop Time  1440    PT Time Calculation (min)  50 min    Activity Tolerance  Patient tolerated treatment well    Behavior During Therapy  [facility] for tasks assessed/performed       Past Medical History:  Diagnosis Date  . Arthritis   . GERD (gastroesophageal reflux disease)   . History of blood transfusion   . Hypothyroidism   . PONV (postoperative nausea and vomiting)   . Ptosis of both eyelids   . Vertigo   . Wears glasses    reading    Past Surgical History:  Procedure Laterality Date  . ABDOMINAL HYSTERECTOMY  1986  . APPLICATION OF ROBOTIC ASSISTANCE FOR SPINAL PROCEDURE N/A 07/17/2017   Procedure: APPLICATION OF ROBOTIC ASSISTANCE FOR SPINAL PROCEDURE;  Surgeon: Elsner, Henry, MD;  [facility];  Service: Neurosurgery;  Laterality: N/A;  . APPLICATION OF ROBOTIC ASSISTANCE FOR SPINAL PROCEDURE N/A 09/01/2017   Procedure: APPLICATION OF ROBOTIC ASSISTANCE FOR SPINAL PROCEDURE;  Surgeon: Elsner, Henry, MD;  [facility];  Service: Neurosurgery;  Laterality: N/A;  . BACK SURGERY  1986   lumb lam  . CARPAL TUNNEL RELEASE  2000   right  . CARPAL TUNNEL RELEASE  2012   left  . COLONOSCOPY    . CYSTOCELE REPAIR  2007   sling  . DISTAL INTERPHALANGEAL JOINT FUSION Right 02/04/2014   Procedure: FUSION DISTAL INTERPHALANGEAL JOINT RIGHT INDEX FINGER ;  Surgeon: Gary Kuzma, MD;  Location: [facility];  Service: Orthopedics;  Laterality: Right;  . EYE SURGERY Bilateral    Cataract surgery with lens implant  . KNEE  SURGERY  2009,2011   partial knee  . OPEN REDUCTION INTERNAL FIXATION (ORIF) PROXIMAL PHALANX Left 08/30/2013   Procedure: OPEN REDUCTION INTERNAL FIXATION (ORIF) PROXIMAL PHALANX FRACTURE LEFT SMALL FINGER; SPLINT RING FINGER;  Surgeon: Gary R Kuzma, MD;  Location: [facility];  Service: Orthopedics;  Laterality: Left;  . POSTERIOR LUMBAR FUSION 4 LEVEL N/A 07/17/2017   Procedure: Thoracic Ten - Lumbar Five revison of hardware with Mazor;  Surgeon: Elsner, Henry, MD;  [facility];  Service: Neurosurgery;  Laterality: N/A;  Thoracic/Lumbar  . PTOSIS REPAIR Bilateral 07/27/2015   Procedure: PTOSIS REPAIR;  Surgeon: Gerald Truesdale, MD;  Location: [facility];  Service: Plastics;  Laterality: Bilateral;  . SHOULDER SURGERY     rt rcr,and lt  . TONSILLECTOMY      There were no vitals filed for this visit.  Subjective Assessment - 11/23/17 1359    Subjective  Patient saw the MD, he wants her to get a CT scan due to the weakness in the foot.  Still reports that she is having difficulty sleeping    Currently in Pain?  Yes    Pain Score  4     Pain Location  Back    Pain Orientation  Lower                         [facility] Adult PT Treatment/Exercise - 11/23/17 0001      Ambulation/Gait   Gait Comments  SPC to the front door and back, she is having much less wobble and difficulty with the walking, when she gets tired she does start to have forward trunk lean and increased wobble, she does want PT close by as she does not feel fully safe with SPC gait      Lumbar Exercises: Aerobic   Recumbent Bike  level 1 x 6 minutes    Nustep  level 5 x 6 minutes      Lumbar Exercises: Machines for Strengthening   Cybex Knee Extension  10# 3x10 with cues to pause at the top for good quad contraction    Cybex Knee Flexion  35# 3x10    Leg Press  40# 3x10    Other Lumbar Machine Exercise  triceps 15#, seated row 25#, lats 25# all 2x15      Lumbar Exercises:  Seated   Other Seated Lumbar Exercises  worked on right ankle DF foot down and foot propped up, needed AAROM, also worked some on Nurse, mental health, and then foot on dyna disc, needing a lot of assist to decreaed hip compensation    Other Seated Lumbar Exercises  weighted ball overhead lift, small hip to hip, hip abduction isometrics, isometric abdominals and ball b/n knees squeeze      Knee/Hip Exercises: Standing   Hip Abduction  Both;2 sets;10 reps    Abduction Limitations  3#               PT Short Term Goals - 10/16/17 1144      PT SHORT TERM GOAL #1   Title  independent with initial HEP    Status  Achieved        PT Long Term Goals - 11/21/17 1339      PT LONG TERM GOAL #1   Title  decrease pain 50%    Status  Partially Met      PT LONG TERM GOAL #2   Title  increase Lumbar ROM 25%    Status  Partially Met      PT LONG TERM GOAL #3   Title  increase LE strength to 4+/5    Status  On-going      PT LONG TERM GOAL #4   Title  go up and down stairs step over step    Status  Partially Met      PT LONG TERM GOAL #5   Title  walk 600 feet without assistive device    Status  On-going            Plan - 11/23/17 1616    Clinical Impression Statement  Patient continues to need hip and core strnegth, she fatigues easily and when she does the foot on the right drags and she stoops forward.  She is to have a CT scan in th enear future    PT Next Visit Plan  continue to work on [place] , especially getting right ankle DF and gait with a SPC    Consulted and Agree with Plan of Care  Patient       Patient will benefit from skilled therapeutic intervention in order to improve the following deficits and impairments:  Abnormal gait, Cardiopulmonary status limiting activity, Decreased activity tolerance, Decreased balance, Decreased mobility, Decreased strength, Postural dysfunction, Improper body mechanics, Impaired flexibility, Pain, Increased muscle spasms, Decreased  range of motion, Difficulty walking  Visit Diagnosis: Muscle  weakness (generalized)  Acute bilateral low back pain with bilateral sciatica  Difficulty in walking, not elsewhere classified  Muscle spasm of back     Problem List Patient Active Problem List   Diagnosis Date Noted  . Osteoarthritis of right knee   . Vertigo   . DDD (degenerative disc disease), cervical   . Benign essential HTN   . History of DVT (deep vein thrombosis)   . Tachycardia   . Steroid-induced hyperglycemia   . Hypothyroidism   . Neuropathic pain   . Acute blood loss anemia   . S/P lumbar fusion   . Closed L5 vertebral fracture ([facility]) 08/31/2017  . Closed fracture of fifth lumbar vertebra ([facility]) 08/30/2017  . Lumbar vertebral fracture ([facility]) 07/17/2017  . Scoliosis 06/13/2017    ALBRIGHT,MICHAEL W., PT 11/23/2017, 4:17 PM  [facility] [address] , [place], [address] Phone: [phone]   Fax:  [phone]  Name: [name] MRN: [id number] Date of Birth: 10/29/1932   

## 2017-11-27 ENCOUNTER — Other Ambulatory Visit: Payer: Self-pay | Admitting: Neurological Surgery

## 2017-11-27 DIAGNOSIS — R29898 Other symptoms and signs involving the musculoskeletal system: Secondary | ICD-10-CM

## 2017-11-28 ENCOUNTER — Ambulatory Visit: Payer: Medicare Other | Admitting: Physical Therapy

## 2017-11-28 ENCOUNTER — Encounter: Payer: Self-pay | Admitting: Physical Therapy

## 2017-11-28 DIAGNOSIS — R262 Difficulty in walking, not elsewhere classified: Secondary | ICD-10-CM

## 2017-11-28 DIAGNOSIS — M5442 Lumbago with sciatica, left side: Secondary | ICD-10-CM | POA: Diagnosis not present

## 2017-11-28 DIAGNOSIS — M6281 Muscle weakness (generalized): Secondary | ICD-10-CM

## 2017-11-28 DIAGNOSIS — M62830 Muscle spasm of back: Secondary | ICD-10-CM

## 2017-11-28 DIAGNOSIS — M5441 Lumbago with sciatica, right side: Secondary | ICD-10-CM

## 2017-11-28 NOTE — Therapy (Signed)
[facility] [place], [place], [address] Phone: [phone]   Fax:  [phone]  Physical Therapy Treatment  Patient Details  Name: [name] MRN: [id number] Date of Birth: 10-Jul-1932 Referring Provider: Ellene Route   Encounter Date: 11/28/2017  PT End of Session - 11/28/17 1533    Visit Number  16    Date for PT Re-Evaluation  12/29/17    PT Start Time  1440    PT Stop Time  1527    PT Time Calculation (min)  47 min    Activity Tolerance  Patient tolerated treatment well    Behavior During Therapy  [facility] for tasks assessed/performed       Past Medical History:  Diagnosis Date  . Arthritis   . GERD (gastroesophageal reflux disease)   . History of blood transfusion   . Hypothyroidism   . PONV (postoperative nausea and vomiting)   . Ptosis of both eyelids   . Vertigo   . Wears glasses    reading    Past Surgical History:  Procedure Laterality Date  . ABDOMINAL HYSTERECTOMY  1986  . APPLICATION OF ROBOTIC ASSISTANCE FOR SPINAL PROCEDURE N/A 07/17/2017   Procedure: APPLICATION OF ROBOTIC ASSISTANCE FOR SPINAL PROCEDURE;  Surgeon: Kristeen Miss, MD;  Location: [facility];  Service: Neurosurgery;  Laterality: N/A;  . APPLICATION OF ROBOTIC ASSISTANCE FOR SPINAL PROCEDURE N/A 09/01/2017   Procedure: APPLICATION OF ROBOTIC ASSISTANCE FOR SPINAL PROCEDURE;  Surgeon: Kristeen Miss, MD;  Location: [facility];  Service: Neurosurgery;  Laterality: N/A;  . [place]  2000   right  . CARPAL TUNNEL RELEASE  2012   left  . COLONOSCOPY    . CYSTOCELE REPAIR  2007   sling  . DISTAL INTERPHALANGEAL JOINT FUSION Right 02/04/2014   Procedure: FUSION DISTAL INTERPHALANGEAL JOINT RIGHT INDEX FINGER ;  Surgeon: Daryll Brod, MD;  Location: [facility];  Service: Orthopedics;  Laterality: Right;  . EYE SURGERY Bilateral    Cataract surgery with lens implant  . KNEE  SURGERY  2009,2011   partial knee  . OPEN REDUCTION INTERNAL FIXATION (ORIF) PROXIMAL PHALANX Left 08/30/2013   Procedure: OPEN REDUCTION INTERNAL FIXATION (ORIF) PROXIMAL PHALANX FRACTURE LEFT SMALL FINGER; SPLINT RING FINGER;  Surgeon: Wynonia Sours, MD;  Location: [facility];  Service: Orthopedics;  Laterality: Left;  . POSTERIOR LUMBAR FUSION 4 LEVEL N/A 07/17/2017   Procedure: Thoracic Ten - Lumbar Five revison of hardware with Mazor;  Surgeon: Kristeen Miss, MD;  Location: [facility];  Service: Neurosurgery;  Laterality: N/A;  Thoracic/Lumbar  . PTOSIS REPAIR Bilateral 07/27/2015   Procedure: PTOSIS REPAIR;  Surgeon: Cristine Polio, MD;  Location: [facility];  Service: Plastics;  Laterality: Bilateral;  . SHOULDER SURGERY     rt rcr,and lt  . TONSILLECTOMY      There were no vitals filed for this visit.                    [facility] Adult PT Treatment/Exercise - 11/28/17 0001      Ambulation/Gait   Gait Comments  SPC after exercise x 200 feet with cues to pick up right foot      Lumbar Exercises: Aerobic   Recumbent Bike  level 1 x 6 minutes    Nustep  level 5 x 6 minutes      Lumbar Exercises: Machines  for Strengthening   Cybex Knee Extension  5# right only 2x10    Cybex Knee Flexion  right only 15# 3x10    Leg Press  right only 20# and no weight, hsa significant crepitus at times in the right knee      Lumbar Exercises: Standing   Other Standing Lumbar Exercises  3# hip flexion, abduction and extension      Lumbar Exercises: Seated   Other Seated Lumbar Exercises  worked on right ankle DF foot down and foot propped up, needed AAROM, also worked some on Nurse, mental health, and then foot on dyna disc, needing a lot of assist to decreaed hip compensation      Lumbar Exercises: Supine   Other Supine Lumbar Exercises  bridges, bridges with right leg only, assisted, then bridges with tband for clam      Lumbar Exercises: Sidelying   Clam  Right;20  reps;2 seconds   used 5# at knee   Hip Abduction  Right;20 reps    Hip Abduction Limitations  needs assist to complete               PT Short Term Goals - 10/16/17 1144      PT SHORT TERM GOAL #1   Title  independent with initial HEP    Status  Achieved        PT Long Term Goals - 11/21/17 1339      PT LONG TERM GOAL #1   Title  decrease pain 50%    Status  Partially Met      PT LONG TERM GOAL #2   Title  increase Lumbar ROM 25%    Status  Partially Met      PT LONG TERM GOAL #3   Title  increase LE strength to 4+/5    Status  On-going      PT LONG TERM GOAL #4   Title  go up and down stairs step over step    Status  Partially Met      PT LONG TERM GOAL #5   Title  walk 600 feet without assistive device    Status  On-going            Plan - 11/28/17 1535    Clinical Impression Statement  Patient with right hip abduction and right DF weakness.  She needs some cues when walking with the cane to assure that she picks up the right foot as she has some foot drop.  She has significant crepitus in the right knee on the leg press.  Working on an overload principle and making the right leg do much more of the work to see if we can get better nerve and mm control    PT Next Visit Plan  continue to work on [employer] , especially getting right ankle DF and gait with a SPC    Consulted and Agree with Plan of Care  Patient       Patient will benefit from skilled therapeutic intervention in order to improve the following deficits and impairments:  Abnormal gait, Cardiopulmonary status limiting activity, Decreased activity tolerance, Decreased balance, Decreased mobility, Decreased strength, Postural dysfunction, Improper body mechanics, Impaired flexibility, Pain, Increased muscle spasms, Decreased range of motion, Difficulty walking  Visit Diagnosis: Muscle weakness (generalized) - Plan: PT plan of care cert/re-cert  Acute bilateral low back pain with bilateral  sciatica - Plan: PT plan of care cert/re-cert  Difficulty in walking, not elsewhere classified - Plan: PT plan of  care cert/re-cert  Muscle spasm of back - Plan: PT plan of care cert/re-cert     Problem List Patient Active Problem List   Diagnosis Date Noted  . Osteoarthritis of right knee   . Vertigo   . DDD (degenerative disc disease), cervical   . Benign essential HTN   . History of DVT (deep vein thrombosis)   . Tachycardia   . Steroid-induced hyperglycemia   . Hypothyroidism   . Neuropathic pain   . Acute blood loss anemia   . S/P lumbar fusion   . Closed L5 vertebral fracture ([facility]) 08/31/2017  . Closed fracture of fifth lumbar vertebra ([place]) 08/30/2017  . Lumbar vertebral fracture ([place]) 07/17/2017  . Scoliosis 06/13/2017    Sumner Boast., PT 11/28/2017, 3:39 PM  [place] [address] [place], [place], [address] Phone: [phone]   Fax:  [phone]  Name: [name] MRN: [id number] Date of Birth: 1932-08-11

## 2017-11-30 ENCOUNTER — Encounter: Payer: Self-pay | Admitting: Physical Therapy

## 2017-11-30 ENCOUNTER — Ambulatory Visit: Payer: Medicare Other | Admitting: Physical Therapy

## 2017-11-30 DIAGNOSIS — M5441 Lumbago with sciatica, right side: Secondary | ICD-10-CM

## 2017-11-30 DIAGNOSIS — M6281 Muscle weakness (generalized): Secondary | ICD-10-CM

## 2017-11-30 DIAGNOSIS — R262 Difficulty in walking, not elsewhere classified: Secondary | ICD-10-CM

## 2017-11-30 DIAGNOSIS — M5442 Lumbago with sciatica, left side: Secondary | ICD-10-CM

## 2017-11-30 DIAGNOSIS — M62830 Muscle spasm of back: Secondary | ICD-10-CM

## 2017-11-30 NOTE — Therapy (Signed)
[place], [place], [address] Phone: [phone]   Fax:  [phone]  Physical Therapy Treatment  Patient Details  Name: [name] MRN: [id number] Date of Birth: 03/28/1933 Referring Provider: Ellene Route   Encounter Date: 11/30/2017  PT End of Session - 11/30/17 1351    Visit Number  17    Date for PT Re-Evaluation  12/29/17    PT Start Time  1300    PT Stop Time  1348    PT Time Calculation (min)  48 min    Activity Tolerance  Patient tolerated treatment well    Behavior During Therapy  [facility] for tasks assessed/performed       Past Medical History:  Diagnosis Date  . Arthritis   . GERD (gastroesophageal reflux disease)   . History of blood transfusion   . Hypothyroidism   . PONV (postoperative nausea and vomiting)   . Ptosis of both eyelids   . Vertigo   . Wears glasses    reading    Past Surgical History:  Procedure Laterality Date  . ABDOMINAL HYSTERECTOMY  1986  . APPLICATION OF ROBOTIC ASSISTANCE FOR SPINAL PROCEDURE N/A 07/17/2017   Procedure: APPLICATION OF ROBOTIC ASSISTANCE FOR SPINAL PROCEDURE;  Surgeon: Kristeen Miss, MD;  Location: [place];  Service: Neurosurgery;  Laterality: N/A;  . APPLICATION OF ROBOTIC ASSISTANCE FOR SPINAL PROCEDURE N/A 09/01/2017   Procedure: APPLICATION OF ROBOTIC ASSISTANCE FOR SPINAL PROCEDURE;  Surgeon: Kristeen Miss, MD;  Location: [place];  Service: Neurosurgery;  Laterality: N/A;  . [place]  2000   right  . CARPAL TUNNEL RELEASE  2012   left  . COLONOSCOPY    . CYSTOCELE REPAIR  2007   sling  . DISTAL INTERPHALANGEAL JOINT FUSION Right 02/04/2014   Procedure: FUSION DISTAL INTERPHALANGEAL JOINT RIGHT INDEX FINGER ;  Surgeon: Daryll Brod, MD;  Location: [place];  Service: Orthopedics;  Laterality: Right;  . EYE SURGERY Bilateral    Cataract surgery with lens implant  . KNEE  SURGERY  2009,2011   partial knee  . OPEN REDUCTION INTERNAL FIXATION (ORIF) PROXIMAL PHALANX Left 08/30/2013   Procedure: OPEN REDUCTION INTERNAL FIXATION (ORIF) PROXIMAL PHALANX FRACTURE LEFT SMALL FINGER; SPLINT RING FINGER;  Surgeon: Wynonia Sours, MD;  Location: [place];  Service: Orthopedics;  Laterality: Left;  . POSTERIOR LUMBAR FUSION 4 LEVEL N/A 07/17/2017   Procedure: Thoracic Ten - Lumbar Five revison of hardware with Mazor;  Surgeon: Kristeen Miss, MD;  Location: [facility] [place];  Service: Neurosurgery;  Laterality: N/A;  Thoracic/Lumbar  . PTOSIS REPAIR Bilateral 07/27/2015   Procedure: PTOSIS REPAIR;  Surgeon: Cristine Polio, MD;  Location: [place];  Service: Plastics;  Laterality: Bilateral;  . SHOULDER SURGERY     rt rcr,and lt  . TONSILLECTOMY      There were no vitals filed for this visit.  Subjective Assessment - 11/30/17 1305    Subjective  Patient reports that she is walking better today, wants to try the elliptical next week    Currently in Pain?  Yes    Pain Score  2     Pain Location  Back    Pain Orientation  Lower    Aggravating Factors   walking  [place] Adult PT Treatment/Exercise - 11/30/17 0001      Ambulation/Gait   Gait Comments  SPC after exercise x 200 feet with cues to pick up right foot      Lumbar Exercises: Aerobic   Recumbent Bike  level 1 x 6 minutes    Nustep  level 5 x 6 minutes      Lumbar Exercises: Machines for Strengthening   Cybex Knee Extension  5# right only 3x10    Cybex Knee Flexion  right only 15# 3x10    Leg Press  right only no weight to decreased the bone on bone crepitus, this seemed to work well had better ROM, less pain but was much easier for her    Other Lumbar Machine Exercise  triceps 15#, seated row 25#, lats 25# all 2x15, lats 25#      Lumbar Exercises: Standing   Other Standing Lumbar Exercises  3# hip flexion, abduction and extension, 20x each each leg       Lumbar Exercises: Seated   Other Seated Lumbar Exercises  worked on right ankle DF foot down and foot propped up, needed AAROM, also worked some on Nurse, mental health, and then foot on dyna disc, needing a lot of assist to decreaed hip compensation      Lumbar Exercises: Supine   Other Supine Lumbar Exercises  bridges with holds, bridges with ball squeexe, bridges with tband clams, PROM for the right ankle DF, 1# right ankle DF               PT Short Term Goals - 10/16/17 1144      PT SHORT TERM GOAL #1   Title  independent with initial HEP    Status  Achieved        PT Long Term Goals - 11/30/17 1354      PT LONG TERM GOAL #1   Title  decrease pain 50%    Status  Achieved      PT LONG TERM GOAL #2   Title  increase Lumbar ROM 25%    Status  Partially Met      PT LONG TERM GOAL #3   Title  increase LE strength to 4+/5    Status  On-going      PT LONG TERM GOAL #4   Title  go up and down stairs step over step    Status  Partially Met      PT LONG TERM GOAL #5   Title  walk 600 feet without assistive device    Status  On-going            Plan - 11/30/17 1351    Clinical Impression Statement  Patient reported being dizzy today, reports that she has had this issue for years and it comes and goes, needed close CGA due to this with any walking today.  Reports that the stretch of the ankle feels good.  She feels that [place] working on the right leg more is helping, she does however have some significant crepitus and wwe need to work with her on changing angles to decrease the crepitus    PT Next Visit Plan  continue to work on [place] , especially getting right ankle DF and gait with a SPC    Consulted and Agree with Plan of Care  Patient       Patient will benefit from skilled therapeutic intervention in order to improve the following deficits and impairments:  Abnormal gait, Cardiopulmonary status limiting activity,  Decreased activity tolerance, Decreased  balance, Decreased mobility, Decreased strength, Postural dysfunction, Improper body mechanics, Impaired flexibility, Pain, Increased muscle spasms, Decreased range of motion, Difficulty walking  Visit Diagnosis: Muscle weakness (generalized)  Acute bilateral low back pain with bilateral sciatica  Difficulty in walking, not elsewhere classified  Muscle spasm of back     Problem List Patient Active Problem List   Diagnosis Date Noted  . Osteoarthritis of right knee   . Vertigo   . DDD (degenerative disc disease), cervical   . Benign essential HTN   . History of DVT (deep vein thrombosis)   . Tachycardia   . Steroid-induced hyperglycemia   . Hypothyroidism   . Neuropathic pain   . Acute blood loss anemia   . S/P lumbar fusion   . Closed L5 vertebral fracture ([place]) 08/31/2017  . Closed fracture of fifth lumbar vertebra ([place]) 08/30/2017  . Lumbar vertebral fracture ([place]) 07/17/2017  . Scoliosis 06/13/2017    Sumner Boast., PT 11/30/2017, 1:55 PM  [place] [address] [place], [place], [address] Phone: [phone]   Fax:  [phone]  Name: [name] MRN: [id number] Date of Birth: 09/01/1932

## 2017-12-06 ENCOUNTER — Ambulatory Visit
Admission: RE | Admit: 2017-12-06 | Discharge: 2017-12-06 | Disposition: A | Discharge status: Home / Self Care | Payer: Medicare Other | Source: Ambulatory Visit | Attending: Neurological Surgery | Admitting: Neurological Surgery

## 2017-12-06 DIAGNOSIS — R29898 Other symptoms and signs involving the musculoskeletal system: Secondary | ICD-10-CM

## 2017-12-07 ENCOUNTER — Ambulatory Visit: Payer: Medicare Other | Attending: Internal Medicine | Admitting: Physical Therapy

## 2017-12-07 ENCOUNTER — Encounter: Payer: Self-pay | Admitting: Physical Therapy

## 2017-12-07 DIAGNOSIS — M5441 Lumbago with sciatica, right side: Secondary | ICD-10-CM | POA: Diagnosis present

## 2017-12-07 DIAGNOSIS — R262 Difficulty in walking, not elsewhere classified: Secondary | ICD-10-CM | POA: Diagnosis present

## 2017-12-07 DIAGNOSIS — M62830 Muscle spasm of back: Secondary | ICD-10-CM | POA: Insufficient documentation

## 2017-12-07 DIAGNOSIS — M6281 Muscle weakness (generalized): Secondary | ICD-10-CM | POA: Diagnosis not present

## 2017-12-07 DIAGNOSIS — M5442 Lumbago with sciatica, left side: Secondary | ICD-10-CM | POA: Insufficient documentation

## 2017-12-07 NOTE — Therapy (Signed)
[facility] [place], [place], [address] Phone: [phone]   Fax:  [phone]  Physical Therapy Treatment  Patient Details  Name: [name] MRN: [id number] Date of Birth: 11/12/1932 Referring Provider: Ellene Route   Encounter Date: 12/07/2017  PT End of Session - 12/07/17 1655    Visit Number  18    Date for PT Re-Evaluation  12/29/17    PT Start Time  1600    PT Stop Time  1650    PT Time Calculation (min)  50 min    Activity Tolerance  Patient tolerated treatment well    Behavior During Therapy  [facility] for tasks assessed/performed       Past Medical History:  Diagnosis Date  . Arthritis   . GERD (gastroesophageal reflux disease)   . History of blood transfusion   . Hypothyroidism   . PONV (postoperative nausea and vomiting)   . Ptosis of both eyelids   . Vertigo   . Wears glasses    reading    Past Surgical History:  Procedure Laterality Date  . ABDOMINAL HYSTERECTOMY  1986  . APPLICATION OF ROBOTIC ASSISTANCE FOR SPINAL PROCEDURE N/A 07/17/2017   Procedure: APPLICATION OF ROBOTIC ASSISTANCE FOR SPINAL PROCEDURE;  Surgeon: Kristeen Miss, MD;  Location: [place];  Service: Neurosurgery;  Laterality: N/A;  . APPLICATION OF ROBOTIC ASSISTANCE FOR SPINAL PROCEDURE N/A 09/01/2017   Procedure: APPLICATION OF ROBOTIC ASSISTANCE FOR SPINAL PROCEDURE;  Surgeon: Kristeen Miss, MD;  Location: [place];  Service: Neurosurgery;  Laterality: N/A;  . [place]  2000   right  . CARPAL TUNNEL RELEASE  2012   left  . COLONOSCOPY    . CYSTOCELE REPAIR  2007   sling  . DISTAL INTERPHALANGEAL JOINT FUSION Right 02/04/2014   Procedure: FUSION DISTAL INTERPHALANGEAL JOINT RIGHT INDEX FINGER ;  Surgeon: Daryll Brod, MD;  Location: [place];  Service: Orthopedics;  Laterality: Right;  . EYE SURGERY Bilateral    Cataract surgery with lens implant  . KNEE  SURGERY  2009,2011   partial knee  . OPEN REDUCTION INTERNAL FIXATION (ORIF) PROXIMAL PHALANX Left 08/30/2013   Procedure: OPEN REDUCTION INTERNAL FIXATION (ORIF) PROXIMAL PHALANX FRACTURE LEFT SMALL FINGER; SPLINT RING FINGER;  Surgeon: Wynonia Sours, MD;  Location: [place];  Service: Orthopedics;  Laterality: Left;  . POSTERIOR LUMBAR FUSION 4 LEVEL N/A 07/17/2017   Procedure: Thoracic Ten - Lumbar Five revison of hardware with Mazor;  Surgeon: Kristeen Miss, MD;  Location: [place];  Service: Neurosurgery;  Laterality: N/A;  Thoracic/Lumbar  . PTOSIS REPAIR Bilateral 07/27/2015   Procedure: PTOSIS REPAIR;  Surgeon: Cristine Polio, MD;  Location: [place];  Service: Plastics;  Laterality: Bilateral;  . SHOULDER SURGERY     rt rcr,and lt  . TONSILLECTOMY      There were no vitals filed for this visit.  Subjective Assessment - 12/07/17 1608    Subjective  Patient reports that the knee hurts when we tried the elliptical today    Currently in Pain?  Yes    Pain Score  3     Pain Location  Back    Pain Orientation  Lower    Aggravating Factors   standing                       [facility] Adult PT  Treatment/Exercise - 12/07/17 0001      Ambulation/Gait   Gait Comments  SPC after exercise x 200 feet with cues to pick up right foot, then a second time at 180 feet      High Level Balance   High Level Balance Activities  Side stepping;Backward walking;Negotiating over obstacles    High Level Balance Comments  4" and 8" toe touches using SPC, ball toss sloid surface and ball toss on airex.        Lumbar Exercises: Aerobic   Recumbent Bike  level 1 x 6 minutes    Nustep  level 5 x 6 minutes, tried elliptical but this caused immediate right knee pain      Lumbar Exercises: Machines for Strengthening   Cybex Knee Extension  5# right only 3x10    Cybex Knee Flexion  right only 15# 3x10    Leg Press  right only no weight short ROM to decrease stress  on the knee but trying to go as low as possible to get mm activity      Lumbar Exercises: Standing   Other Standing Lumbar Exercises  4# hip flexion, abduction and extension, 20x each each leg      Lumbar Exercises: Seated   Other Seated Lumbar Exercises  worked on right ankle DF foot down and foot propped up, needed AAROM, also worked some on eccentrics, and then foot on dyna disc, needing a lot of assist to decreaed hip compensation               PT Short Term Goals - 10/16/17 1144      PT SHORT TERM GOAL #1   Title  independent with initial HEP    Status  Achieved        PT Long Term Goals - 12/07/17 1658      PT LONG TERM GOAL #3   Title  increase LE strength to 4+/5    Status  On-going            Plan - 12/07/17 1655    Clinical Impression Statement  Patient with less dizziness and less unstable.  She had the right knee that has crepitus with some things unable to do elliptical and unable to do legg press.  Has difficulty with the right foot when tired    PT Next Visit Plan  continue to work on [place] , especially getting right ankle DF and gait with a SPC    Consulted and Agree with Plan of Care  Patient       Patient will benefit from skilled therapeutic intervention in order to improve the following deficits and impairments:  Abnormal gait, Cardiopulmonary status limiting activity, Decreased activity tolerance, Decreased balance, Decreased mobility, Decreased strength, Postural dysfunction, Improper body mechanics, Impaired flexibility, Pain, Increased muscle spasms, Decreased range of motion, Difficulty walking  Visit Diagnosis: Muscle weakness (generalized)  Acute bilateral low back pain with bilateral sciatica  Difficulty in walking, not elsewhere classified  Muscle spasm of back     Problem List Patient Active Problem List   Diagnosis Date Noted  . Osteoarthritis of right knee   . Vertigo   . DDD (degenerative disc disease), cervical    . Benign essential HTN   . History of DVT (deep vein thrombosis)   . Tachycardia   . Steroid-induced hyperglycemia   . Hypothyroidism   . Neuropathic pain   . Acute blood loss anemia   . S/P lumbar fusion   . Closed  L5 vertebral fracture ([place]) 08/31/2017  . Closed fracture of fifth lumbar vertebra ([place]) 08/30/2017  . Lumbar vertebral fracture ([place]) 07/17/2017  . Scoliosis 06/13/2017    Sumner Boast., PT 12/07/2017, 4:59 PM  [place] [address] [place], [place], [address] Phone: [phone]   Fax:  [phone]  Name: [name] MRN: [id number] Date of Birth: 25-Jan-1933

## 2017-12-08 ENCOUNTER — Ambulatory Visit
Admission: RE | Admit: 2017-12-08 | Discharge: 2017-12-08 | Disposition: A | Discharge status: Home / Self Care | Payer: Medicare Other | Source: Ambulatory Visit | Attending: Internal Medicine | Admitting: Internal Medicine

## 2017-12-08 DIAGNOSIS — Z1231 Encounter for screening mammogram for malignant neoplasm of breast: Secondary | ICD-10-CM

## 2017-12-12 ENCOUNTER — Encounter: Payer: Self-pay | Admitting: Physical Therapy

## 2017-12-12 ENCOUNTER — Other Ambulatory Visit: Payer: Self-pay | Admitting: Internal Medicine

## 2017-12-12 ENCOUNTER — Ambulatory Visit: Payer: Medicare Other | Admitting: Physical Therapy

## 2017-12-12 DIAGNOSIS — M62830 Muscle spasm of back: Secondary | ICD-10-CM

## 2017-12-12 DIAGNOSIS — R928 Other abnormal and inconclusive findings on diagnostic imaging of breast: Secondary | ICD-10-CM

## 2017-12-12 DIAGNOSIS — M6281 Muscle weakness (generalized): Secondary | ICD-10-CM

## 2017-12-12 DIAGNOSIS — M5442 Lumbago with sciatica, left side: Secondary | ICD-10-CM

## 2017-12-12 DIAGNOSIS — M5441 Lumbago with sciatica, right side: Secondary | ICD-10-CM

## 2017-12-12 DIAGNOSIS — R262 Difficulty in walking, not elsewhere classified: Secondary | ICD-10-CM

## 2017-12-12 NOTE — Therapy (Signed)
[facility] [place], [place], [address] Phone: [phone]   Fax:  [phone]  Physical Therapy Treatment  Patient Details  Name: [name] MRN: [id number] Date of Birth: 1932/06/19 Referring Provider: Ellene Route   Encounter Date: 12/12/2017  PT End of Session - 12/12/17 1522    Visit Number  19    Date for PT Re-Evaluation  12/29/17    PT Start Time  1345    PT Stop Time  1435    PT Time Calculation (min)  50 min    Activity Tolerance  Patient tolerated treatment well    Behavior During Therapy  [facility] for tasks assessed/performed       Past Medical History:  Diagnosis Date  . Arthritis   . GERD (gastroesophageal reflux disease)   . History of blood transfusion   . Hypothyroidism   . PONV (postoperative nausea and vomiting)   . Ptosis of both eyelids   . Vertigo   . Wears glasses    reading    Past Surgical History:  Procedure Laterality Date  . ABDOMINAL HYSTERECTOMY  1986  . APPLICATION OF ROBOTIC ASSISTANCE FOR SPINAL PROCEDURE N/A 07/17/2017   Procedure: APPLICATION OF ROBOTIC ASSISTANCE FOR SPINAL PROCEDURE;  Surgeon: Kristeen Miss, MD;  Location: [place];  Service: Neurosurgery;  Laterality: N/A;  . APPLICATION OF ROBOTIC ASSISTANCE FOR SPINAL PROCEDURE N/A 09/01/2017   Procedure: APPLICATION OF ROBOTIC ASSISTANCE FOR SPINAL PROCEDURE;  Surgeon: Kristeen Miss, MD;  Location: [place];  Service: Neurosurgery;  Laterality: N/A;  . [place]  2000   right  . CARPAL TUNNEL RELEASE  2012   left  . COLONOSCOPY    . CYSTOCELE REPAIR  2007   sling  . DISTAL INTERPHALANGEAL JOINT FUSION Right 02/04/2014   Procedure: FUSION DISTAL INTERPHALANGEAL JOINT RIGHT INDEX FINGER ;  Surgeon: Daryll Brod, MD;  Location: [place];  Service: Orthopedics;  Laterality: Right;  . EYE SURGERY Bilateral    Cataract surgery with lens implant  . KNEE  SURGERY  2009,2011   partial knee  . OPEN REDUCTION INTERNAL FIXATION (ORIF) PROXIMAL PHALANX Left 08/30/2013   Procedure: OPEN REDUCTION INTERNAL FIXATION (ORIF) PROXIMAL PHALANX FRACTURE LEFT SMALL FINGER; SPLINT RING FINGER;  Surgeon: Wynonia Sours, MD;  Location: [place];  Service: Orthopedics;  Laterality: Left;  . POSTERIOR LUMBAR FUSION 4 LEVEL N/A 07/17/2017   Procedure: Thoracic Ten - Lumbar Five revison of hardware with Mazor;  Surgeon: Kristeen Miss, MD;  Location: [place];  Service: Neurosurgery;  Laterality: N/A;  Thoracic/Lumbar  . PTOSIS REPAIR Bilateral 07/27/2015   Procedure: PTOSIS REPAIR;  Surgeon: Cristine Polio, MD;  Location: [place];  Service: Plastics;  Laterality: Bilateral;  . SHOULDER SURGERY     rt rcr,and lt  . TONSILLECTOMY      There were no vitals filed for this visit.  Subjective Assessment - 12/12/17 1349    Subjective  I have good days and bad days.  Sometimes I walk well, somtimes I am all over the place"    Currently in Pain?  Yes    Pain Score  2     Pain Location  Back    Pain Orientation  Lower    Pain Relieving Factors  rest  [place] Adult PT Treatment/Exercise - 12/12/17 0001      Ambulation/Gait   Gait Comments  SPC to the car, 1 rest, some cues to pick up foot, at times needed to touch the wall for balance      High Level Balance   High Level Balance Activities  Side stepping;Backward walking;Negotiating over obstacles      Lumbar Exercises: Aerobic   Recumbent Bike  level 1 x 6 minutes    Nustep  level 5 x 6 minutes      Lumbar Exercises: Machines for Strengthening   Cybex Knee Extension  5# right only 3x10    Cybex Knee Flexion  right only 15# 3x10    Leg Press  40# both legs smaller ROM no knee pain    Other Lumbar Machine Exercise  seated row and lats 25# 2x10 each      Lumbar Exercises: Standing   Other Standing Lumbar Exercises  4# hip flexion, abduction and  extension, 20x each each leg      Lumbar Exercises: Supine   Other Supine Lumbar Exercises  feet on ball K2C, trunk rotation, bridges and abdominals, DF for ankle especially rihgt      Lumbar Exercises: Sidelying   Clam  Right;20 reps;2 seconds               PT Short Term Goals - 10/16/17 1144      PT SHORT TERM GOAL #1   Title  independent with initial HEP    Status  Achieved        PT Long Term Goals - 12/07/17 1658      PT LONG TERM GOAL #3   Title  increase LE strength to 4+/5    Status  On-going            Plan - 12/12/17 1523    Clinical Impression Statement  Patient reports tha tat times she feels rally good and strong and then there are times when she is off balance and feels weak.  She has the drop foot that she can move but is very weak, may need to look at AFO for safety but hate to use if she ahs the motion    PT Next Visit Plan  continue to work on [place] , especially getting right ankle DF and gait with a SPC    Consulted and Agree with Plan of Care  Patient       Patient will benefit from skilled therapeutic intervention in order to improve the following deficits and impairments:  Abnormal gait, Cardiopulmonary status limiting activity, Decreased activity tolerance, Decreased balance, Decreased mobility, Decreased strength, Postural dysfunction, Improper body mechanics, Impaired flexibility, Pain, Increased muscle spasms, Decreased range of motion, Difficulty walking  Visit Diagnosis: Muscle weakness (generalized)  Acute bilateral low back pain with bilateral sciatica  Difficulty in walking, not elsewhere classified  Muscle spasm of back     Problem List Patient Active Problem List   Diagnosis Date Noted  . Osteoarthritis of right knee   . Vertigo   . DDD (degenerative disc disease), cervical   . Benign essential HTN   . History of DVT (deep vein thrombosis)   . Tachycardia   . Steroid-induced hyperglycemia   . Hypothyroidism    . Neuropathic pain   . Acute blood loss anemia   . S/P lumbar fusion   . Closed L5 vertebral fracture ([place]) 08/31/2017  . Closed fracture of fifth lumbar vertebra ([place]) 08/30/2017  . Lumbar vertebral  fracture ([place]) 07/17/2017  . Scoliosis 06/13/2017    Sumner Boast., PT 12/12/2017, 3:24 PM  [facility] [address] [place], [place], [address] Phone: [phone]   Fax:  [phone]  Name: SHRUTHI NORTHRUP MRN: [id number] Date of Birth: 1932-12-30

## 2017-12-14 ENCOUNTER — Ambulatory Visit: Payer: Medicare Other | Admitting: Physical Therapy

## 2017-12-14 ENCOUNTER — Encounter: Payer: Medicare Other | Admitting: Physical Therapy

## 2017-12-14 ENCOUNTER — Encounter: Payer: Self-pay | Admitting: Physical Therapy

## 2017-12-14 DIAGNOSIS — M5442 Lumbago with sciatica, left side: Secondary | ICD-10-CM

## 2017-12-14 DIAGNOSIS — M5441 Lumbago with sciatica, right side: Secondary | ICD-10-CM

## 2017-12-14 DIAGNOSIS — M62830 Muscle spasm of back: Secondary | ICD-10-CM

## 2017-12-14 DIAGNOSIS — M6281 Muscle weakness (generalized): Secondary | ICD-10-CM | POA: Diagnosis not present

## 2017-12-14 DIAGNOSIS — R262 Difficulty in walking, not elsewhere classified: Secondary | ICD-10-CM

## 2017-12-14 NOTE — Therapy (Signed)
[address] [place], [place], [address] Phone: [phone]   Fax:  [phone] Progress Note Reporting Period 11/09/17 to 12/14/17 for visit 11-20   See note below for Objective Data and Assessment of Progress/Goals.      Physical Therapy Treatment  Patient Details  Name: [name] MRN: [id number] Date of Birth: 01-08-1933 Referring Provider: Ellene Route   Encounter Date: 12/14/2017  PT End of Session - 12/14/17 1645    Visit Number  20    Date for PT Re-Evaluation  12/29/17    PT Start Time  1615    PT Stop Time  1700    PT Time Calculation (min)  45 min    Activity Tolerance  Patient tolerated treatment well    Behavior During Therapy  [facility] for tasks assessed/performed       Past Medical History:  Diagnosis Date  . Arthritis   . GERD (gastroesophageal reflux disease)   . History of blood transfusion   . Hypothyroidism   . PONV (postoperative nausea and vomiting)   . Ptosis of both eyelids   . Vertigo   . Wears glasses    reading    Past Surgical History:  Procedure Laterality Date  . ABDOMINAL HYSTERECTOMY  1986  . APPLICATION OF ROBOTIC ASSISTANCE FOR SPINAL PROCEDURE N/A 07/17/2017   Procedure: APPLICATION OF ROBOTIC ASSISTANCE FOR SPINAL PROCEDURE;  Surgeon: Kristeen Miss, MD;  Location: [place];  Service: Neurosurgery;  Laterality: N/A;  . APPLICATION OF ROBOTIC ASSISTANCE FOR SPINAL PROCEDURE N/A 09/01/2017   Procedure: APPLICATION OF ROBOTIC ASSISTANCE FOR SPINAL PROCEDURE;  Surgeon: Kristeen Miss, MD;  Location: [place];  Service: Neurosurgery;  Laterality: N/A;  . [place]  2000   right  . CARPAL TUNNEL RELEASE  2012   left  . COLONOSCOPY    . CYSTOCELE REPAIR  2007   sling  . DISTAL INTERPHALANGEAL JOINT FUSION Right 02/04/2014   Procedure: FUSION DISTAL INTERPHALANGEAL JOINT RIGHT INDEX FINGER ;  Surgeon: Daryll Brod, MD;   Location: [place];  Service: Orthopedics;  Laterality: Right;  . EYE SURGERY Bilateral    Cataract surgery with lens implant  . KNEE SURGERY  2009,2011   partial knee  . OPEN REDUCTION INTERNAL FIXATION (ORIF) PROXIMAL PHALANX Left 08/30/2013   Procedure: OPEN REDUCTION INTERNAL FIXATION (ORIF) PROXIMAL PHALANX FRACTURE LEFT SMALL FINGER; SPLINT RING FINGER;  Surgeon: Wynonia Sours, MD;  Location: [place];  Service: Orthopedics;  Laterality: Left;  . POSTERIOR LUMBAR FUSION 4 LEVEL N/A 07/17/2017   Procedure: Thoracic Ten - Lumbar Five revison of hardware with Mazor;  Surgeon: Kristeen Miss, MD;  Location: [place];  Service: Neurosurgery;  Laterality: N/A;  Thoracic/Lumbar  . PTOSIS REPAIR Bilateral 07/27/2015   Procedure: PTOSIS REPAIR;  Surgeon: Cristine Polio, MD;  Location: [place];  Service: Plastics;  Laterality: Bilateral;  . SHOULDER SURGERY     rt rcr,and lt  . TONSILLECTOMY      There were no vitals filed for this visit.  Subjective Assessment - 12/14/17 1626    Subjective  Patient reports that she is tired, she has been going to appointments    Currently in Pain?  Yes    Pain Score  2     Pain Location  Back    Aggravating Factors   standing and sitting  [facility] Adult PT Treatment/Exercise - 12/14/17 0001      Lumbar Exercises: Aerobic   Recumbent Bike  level 1 x 6 minutes    Nustep  level 6 x 6 minutes      Lumbar Exercises: Machines for Strengthening   Cybex Knee Extension  5# right only 3x10    Cybex Knee Flexion  right only 15# 3x10      Electrical Stimulation   Electrical Stimulation Location  right anterior tibialis with her in supine working on [employer]    Clinical biochemist Parameters  sitting 12 on 4 off    Electrical Stimulation Goals  Neuromuscular facilitation               PT Short Term Goals - 10/16/17 1144      PT  SHORT TERM GOAL #1   Title  independent with initial HEP    Status  Achieved        PT Long Term Goals - 12/14/17 1647      PT LONG TERM GOAL #2   Title  increase Lumbar ROM 25%    Status  Achieved      PT LONG TERM GOAL #3   Title  increase LE strength to 4+/5    Status  Partially Met            Plan - 12/14/17 1645    Clinical Impression Statement  Patient had increased fatigue today, she has been up going to appointments.  She has increased right drop foot and drag when she is tired, tried to use [place] stimulation to activate the anterior tibialis, we could not get to the point of mm contraction with the electricity due to some pain , but she was able to do and tolerate with fair ability to get active DF in supine with leg elevated    PT Next Visit Plan  see if [place] stimulation had any carryover    Consulted and Agree with Plan of Care  Patient       Patient will benefit from skilled therapeutic intervention in order to improve the following deficits and impairments:  Abnormal gait, Cardiopulmonary status limiting activity, Decreased activity tolerance, Decreased balance, Decreased mobility, Decreased strength, Postural dysfunction, Improper body mechanics, Impaired flexibility, Pain, Increased muscle spasms, Decreased range of motion, Difficulty walking  Visit Diagnosis: Muscle weakness (generalized)  Acute bilateral low back pain with bilateral sciatica  Difficulty in walking, not elsewhere classified  Muscle spasm of back     Problem List Patient Active Problem List   Diagnosis Date Noted  . Osteoarthritis of right knee   . Vertigo   . DDD (degenerative disc disease), cervical   . Benign essential HTN   . History of DVT (deep vein thrombosis)   . Tachycardia   . Steroid-induced hyperglycemia   . Hypothyroidism   . Neuropathic pain   . Acute blood loss anemia   . S/P lumbar fusion   . Closed L5 vertebral fracture ([place]) 08/31/2017  . Closed  fracture of fifth lumbar vertebra ([place]) 08/30/2017  . Lumbar vertebral fracture ([place]) 07/17/2017  . Scoliosis 06/13/2017    Sumner Boast., PT  12/14/2017, 4:48 PM  [address] [place], [place], [address] Phone: [phone]   Fax:  [phone]  Name: [name] MRN: [id number] Date of Birth: 04-13-1932

## 2017-12-15 ENCOUNTER — Ambulatory Visit: Payer: Medicare Other

## 2017-12-15 ENCOUNTER — Ambulatory Visit
Admission: RE | Admit: 2017-12-15 | Discharge: 2017-12-15 | Disposition: A | Discharge status: Home / Self Care | Payer: Medicare Other | Source: Ambulatory Visit | Attending: Internal Medicine | Admitting: Internal Medicine

## 2017-12-15 DIAGNOSIS — R928 Other abnormal and inconclusive findings on diagnostic imaging of breast: Secondary | ICD-10-CM

## 2017-12-19 ENCOUNTER — Ambulatory Visit: Payer: Medicare Other | Admitting: Physical Therapy

## 2017-12-19 ENCOUNTER — Encounter: Payer: Self-pay | Admitting: Physical Therapy

## 2017-12-19 DIAGNOSIS — M5441 Lumbago with sciatica, right side: Secondary | ICD-10-CM

## 2017-12-19 DIAGNOSIS — M6281 Muscle weakness (generalized): Secondary | ICD-10-CM

## 2017-12-19 DIAGNOSIS — M5442 Lumbago with sciatica, left side: Secondary | ICD-10-CM

## 2017-12-19 DIAGNOSIS — M62830 Muscle spasm of back: Secondary | ICD-10-CM

## 2017-12-19 DIAGNOSIS — R262 Difficulty in walking, not elsewhere classified: Secondary | ICD-10-CM

## 2017-12-19 NOTE — Therapy (Signed)
[facility] [address] [place], [place], [address] Phone: [phone]   Fax:  [phone]  Physical Therapy Treatment  Patient Details  Name: [name] MRN: [id number] Date of Birth: 11/05/1932 Referring Provider: Elsner   Encounter Date: 12/19/2017  PT End of Session - 12/19/17 1431    Visit Number  21    Date for PT Re-Evaluation  12/29/17    PT Start Time  1355    PT Stop Time  1444    PT Time Calculation (min)  49 min    Activity Tolerance  Patient tolerated treatment well    Behavior During Therapy  [facility] for tasks assessed/performed       Past Medical History:  Diagnosis Date  . Arthritis   . GERD (gastroesophageal reflux disease)   . History of blood transfusion   . Hypothyroidism   . PONV (postoperative nausea and vomiting)   . Ptosis of both eyelids   . Vertigo   . Wears glasses    reading    Past Surgical History:  Procedure Laterality Date  . ABDOMINAL HYSTERECTOMY  1986  . APPLICATION OF ROBOTIC ASSISTANCE FOR SPINAL PROCEDURE N/A 07/17/2017   Procedure: APPLICATION OF ROBOTIC ASSISTANCE FOR SPINAL PROCEDURE;  Surgeon: Elsner, Henry, MD;  [facility];  Service: Neurosurgery;  Laterality: N/A;  . APPLICATION OF ROBOTIC ASSISTANCE FOR SPINAL PROCEDURE N/A 09/01/2017   Procedure: APPLICATION OF ROBOTIC ASSISTANCE FOR SPINAL PROCEDURE;  Surgeon: Elsner, Henry, MD;  [facility];  Service: Neurosurgery;  Laterality: N/A;  . BACK SURGERY  1986   lumb lam  . CARPAL TUNNEL RELEASE  2000   right  . CARPAL TUNNEL RELEASE  2012   left  . COLONOSCOPY    . CYSTOCELE REPAIR  2007   sling  . DISTAL INTERPHALANGEAL JOINT FUSION Right 02/04/2014   Procedure: FUSION DISTAL INTERPHALANGEAL JOINT RIGHT INDEX FINGER ;  Surgeon: Gary Kuzma, MD;  Location: [facility];  Service: Orthopedics;  Laterality: Right;  . EYE SURGERY Bilateral    Cataract surgery with lens implant  . KNEE  SURGERY  2009,2011   partial knee  . OPEN REDUCTION INTERNAL FIXATION (ORIF) PROXIMAL PHALANX Left 08/30/2013   Procedure: OPEN REDUCTION INTERNAL FIXATION (ORIF) PROXIMAL PHALANX FRACTURE LEFT SMALL FINGER; SPLINT RING FINGER;  Surgeon: Gary R Kuzma, MD;  Location: [facility];  Service: Orthopedics;  Laterality: Left;  . POSTERIOR LUMBAR FUSION 4 LEVEL N/A 07/17/2017   Procedure: Thoracic Ten - Lumbar Five revison of hardware with Mazor;  Surgeon: Elsner, Henry, MD;  [facility];  Service: Neurosurgery;  Laterality: N/A;  Thoracic/Lumbar  . PTOSIS REPAIR Bilateral 07/27/2015   Procedure: PTOSIS REPAIR;  Surgeon: Gerald Truesdale, MD;  [facility];  Service: Plastics;  Laterality: Bilateral;  . SHOULDER SURGERY     rt rcr,and lt  . TONSILLECTOMY      There were no vitals filed for this visit.  Subjective Assessment - 12/19/17 1358    Subjective  Patient reports that she is doing more walking without her walker in the home, she does fatigue easily    Currently in Pain?  Yes    Pain Score  2     Pain Location  Back    Pain Orientation  Lower    Aggravating Factors   standing and walking    Pain Relieving Factors  rest                         [facility] Adult PT Treatment/Exercise - 12/19/17 0001      Ambulation/Gait   Gait Comments  SPC some cues to pick up right foot, close supervision, she has occasion to be off balance and use wall or PT to right self      Lumbar Exercises: Aerobic   Recumbent Bike  level 1 x 6 minutes    Nustep  level 6 x 6 minutes      Lumbar Exercises: Machines for Strengthening   Cybex Knee Extension  5# right only 3x10    Cybex Knee Flexion  right only 15# 3x10    Leg Press  40# both legs smaller ROM no knee pain      Electrical Stimulation   Electrical Stimulation Location  right anterior tibialis with her in supine working on [employer]    Clinical biochemist  Parameters  4 on 12 off    Electrical Stimulation Goals  Neuromuscular facilitation               PT Short Term Goals - 10/16/17 1144      PT SHORT TERM GOAL #1   Title  independent with initial HEP    Status  Achieved        PT Long Term Goals - 12/19/17 1435      PT LONG TERM GOAL #1   Title  decrease pain 50%      PT LONG TERM GOAL #2   Title  increase Lumbar ROM 25%    Status  Achieved      PT LONG TERM GOAL #3   Title  increase LE strength to 4+/5    Status  Partially Met      PT LONG TERM GOAL #4   Title  go up and down stairs step over step    Status  Partially Met      PT LONG TERM GOAL #5   Title  walk 600 feet without assistive device    Status  On-going            Plan - 12/19/17 1432    Clinical Impression Statement  Patient overall from evaluation has made great improvments in strength, function and gait.  She still has limitations in gait and balance, she has right DF weakness that causes some toe drag when she is tired.  She used to be able to walk with walker about 75 feet and then would be very bent over and tired and wobbly, now is able to walk 180 feet with SPC and close supervision and hold good posture and less wobble type pattern.  I have started to try [place] stimulation on the right anterior tibialis to stimulate this to help with drop foot.      PT Frequency  2x / week    PT Duration  4 weeks    PT Next Visit Plan  would continue to work on strength, gait and function as well as safety and balance    Consulted and Agree with Plan of Care  Patient       Patient will benefit from skilled therapeutic intervention in order to improve the following deficits and impairments:  Abnormal gait, Cardiopulmonary status limiting activity, Decreased activity tolerance, Decreased balance, Decreased mobility, Decreased strength, Postural dysfunction, Improper body mechanics, Impaired flexibility, Pain, Increased muscle spasms, Decreased range of  motion, Difficulty walking  Visit Diagnosis: Muscle weakness (generalized)  Acute bilateral low back pain with bilateral sciatica  Difficulty  in walking, not elsewhere classified  Muscle spasm of back     Problem List Patient Active Problem List   Diagnosis Date Noted  . Osteoarthritis of right knee   . Vertigo   . DDD (degenerative disc disease), cervical   . Benign essential HTN   . History of DVT (deep vein thrombosis)   . Tachycardia   . Steroid-induced hyperglycemia   . Hypothyroidism   . Neuropathic pain   . Acute blood loss anemia   . S/P lumbar fusion   . Closed L5 vertebral fracture ([place]) 08/31/2017  . Closed fracture of fifth lumbar vertebra ([place]) 08/30/2017  . Lumbar vertebral fracture ([place]) 07/17/2017  . Scoliosis 06/13/2017    Sumner Boast., PT 12/19/2017, 2:36 PM  [address] [place], [place], [address] Phone: [phone]   Fax:  [phone]  Name: [name] MRN: [id number] Date of Birth: 1933/01/28

## 2017-12-21 ENCOUNTER — Encounter: Payer: Self-pay | Admitting: Physical Therapy

## 2017-12-21 ENCOUNTER — Ambulatory Visit: Payer: Medicare Other | Admitting: Physical Therapy

## 2017-12-21 DIAGNOSIS — M5441 Lumbago with sciatica, right side: Secondary | ICD-10-CM

## 2017-12-21 DIAGNOSIS — M6281 Muscle weakness (generalized): Secondary | ICD-10-CM

## 2017-12-21 DIAGNOSIS — M62830 Muscle spasm of back: Secondary | ICD-10-CM

## 2017-12-21 DIAGNOSIS — R262 Difficulty in walking, not elsewhere classified: Secondary | ICD-10-CM

## 2017-12-21 DIAGNOSIS — M5442 Lumbago with sciatica, left side: Secondary | ICD-10-CM

## 2017-12-21 NOTE — Therapy (Signed)
[address] [place], [place], [address] Phone: [phone]   Fax:  [phone]  Physical Therapy Treatment  Patient Details  Name: [name] MRN: [id number] Date of Birth: 05/23/1932 Referring Provider: Ellene Route   Encounter Date: 12/21/2017  PT End of Session - 12/21/17 1502    Visit Number  22    Date for PT Re-Evaluation  12/29/17    PT Start Time  1345    PT Stop Time  1435    PT Time Calculation (min)  50 min    Activity Tolerance  Patient tolerated treatment well    Behavior During Therapy  [facility] for tasks assessed/performed       Past Medical History:  Diagnosis Date  . Arthritis   . GERD (gastroesophageal reflux disease)   . History of blood transfusion   . Hypothyroidism   . PONV (postoperative nausea and vomiting)   . Ptosis of both eyelids   . Vertigo   . Wears glasses    reading    Past Surgical History:  Procedure Laterality Date  . ABDOMINAL HYSTERECTOMY  1986  . APPLICATION OF ROBOTIC ASSISTANCE FOR SPINAL PROCEDURE N/A 07/17/2017   Procedure: APPLICATION OF ROBOTIC ASSISTANCE FOR SPINAL PROCEDURE;  Surgeon: Kristeen Miss, MD;  Location: [facility] [place];  Service: Neurosurgery;  Laterality: N/A;  . APPLICATION OF ROBOTIC ASSISTANCE FOR SPINAL PROCEDURE N/A 09/01/2017   Procedure: APPLICATION OF ROBOTIC ASSISTANCE FOR SPINAL PROCEDURE;  Surgeon: Kristeen Miss, MD;  Location: [place];  Service: Neurosurgery;  Laterality: N/A;  . [place]  2000   right  . CARPAL TUNNEL RELEASE  2012   left  . COLONOSCOPY    . CYSTOCELE REPAIR  2007   sling  . DISTAL INTERPHALANGEAL JOINT FUSION Right 02/04/2014   Procedure: FUSION DISTAL INTERPHALANGEAL JOINT RIGHT INDEX FINGER ;  Surgeon: Daryll Brod, MD;  Location: [facility];  Service: Orthopedics;  Laterality: Right;  . EYE SURGERY Bilateral    Cataract surgery with lens implant  . KNEE  SURGERY  2009,2011   partial knee  . OPEN REDUCTION INTERNAL FIXATION (ORIF) PROXIMAL PHALANX Left 08/30/2013   Procedure: OPEN REDUCTION INTERNAL FIXATION (ORIF) PROXIMAL PHALANX FRACTURE LEFT SMALL FINGER; SPLINT RING FINGER;  Surgeon: Wynonia Sours, MD;  Location: [facility];  Service: Orthopedics;  Laterality: Left;  . POSTERIOR LUMBAR FUSION 4 LEVEL N/A 07/17/2017   Procedure: Thoracic Ten - Lumbar Five revison of hardware with Mazor;  Surgeon: Kristeen Miss, MD;  Location: [facility];  Service: Neurosurgery;  Laterality: N/A;  Thoracic/Lumbar  . PTOSIS REPAIR Bilateral 07/27/2015   Procedure: PTOSIS REPAIR;  Surgeon: Cristine Polio, MD;  Location: [facility];  Service: Plastics;  Laterality: Bilateral;  . SHOULDER SURGERY     rt rcr,and lt  . TONSILLECTOMY      There were no vitals filed for this visit.  Subjective Assessment - 12/21/17 1356    Subjective  Patietn saw the MD, feels like she is making good progress, likes that we are doing the estim on the anterior tibialis.  Feels like this will take some time    Currently in Pain?  Yes    Pain Score  2     Pain Location  Back  [facility] Adult PT Treatment/Exercise - 12/21/17 0001      High Level Balance   High Level Balance Activities  Side stepping;Backward walking;Tandem walking;Marching forwards;Negotiating over obstacles    High Level Balance Comments  8" toe touches and stp ups, ball toss and ball kicks with using two canes      Lumbar Exercises: Aerobic   Recumbent Bike  level 1 x 6 minutes    Nustep  level 6 x 6 minutes      Lumbar Exercises: Machines for Strengthening   Cybex Knee Extension  5# right only 3x10    Cybex Knee Flexion  right only 15# 3x10    Leg Press  40# both legs smaller ROM no knee pain, right only small ROM 20-# and then without weight      Electrical Stimulation   Electrical Stimulation Location  right anterior tibialis with her in supine  working on [employer] Parameters  4 on 12 off    Electrical Stimulation Goals  Neuromuscular facilitation               PT Short Term Goals - 10/16/17 1144      PT SHORT TERM GOAL #1   Title  independent with initial HEP    Status  Achieved        PT Long Term Goals - 12/19/17 1435      PT LONG TERM GOAL #1   Title  decrease pain 50%      PT LONG TERM GOAL #2   Title  increase Lumbar ROM 25%    Status  Achieved      PT LONG TERM GOAL #3   Title  increase LE strength to 4+/5    Status  Partially Met      PT LONG TERM GOAL #4   Title  go up and down stairs step over step    Status  Partially Met      PT LONG TERM GOAL #5   Title  walk 600 feet without assistive device    Status  On-going            Plan - 12/21/17 1557    Clinical Impression Statement  Patient with the right DF being limited, she feels like the stimulation that we started is helping, when she gets tired she tends to catch  the toe.  Had a lot of difficulty with marching todya due to fatigue and weakness    PT Next Visit Plan  would continue to work on strength, gait and function as well as safety and balance    Consulted and Agree with Plan of Care  Patient       Patient will benefit from skilled therapeutic intervention in order to improve the following deficits and impairments:  Abnormal gait, Cardiopulmonary status limiting activity, Decreased activity tolerance, Decreased balance, Decreased mobility, Decreased strength, Postural dysfunction, Improper body mechanics, Impaired flexibility, Pain, Increased muscle spasms, Decreased range of motion, Difficulty walking  Visit Diagnosis: Muscle weakness (generalized)  Acute bilateral low back pain with bilateral sciatica  Difficulty in walking, not elsewhere classified  Muscle spasm of back     Problem List Patient Active Problem List   Diagnosis Date Noted  .  Osteoarthritis of right knee   . Vertigo   . DDD (degenerative disc disease), cervical   . Benign essential HTN   . History of DVT (deep vein thrombosis)   .  Tachycardia   . Steroid-induced hyperglycemia   . Hypothyroidism   . Neuropathic pain   . Acute blood loss anemia   . S/P lumbar fusion   . Closed L5 vertebral fracture ([place]) 08/31/2017  . Closed fracture of fifth lumbar vertebra ([place]) 08/30/2017  . Lumbar vertebral fracture ([place]) 07/17/2017  . Scoliosis 06/13/2017    Sumner Boast., PT 12/21/2017, 3:59 PM  [address] [place], [place], [address] Phone: [phone]   Fax:  [phone]  Name: [name] MRN: [id number] Date of Birth: [birth year]

## 2017-12-26 ENCOUNTER — Ambulatory Visit: Payer: Medicare Other | Admitting: Physical Therapy

## 2017-12-26 ENCOUNTER — Encounter: Payer: Self-pay | Admitting: Physical Therapy

## 2017-12-26 DIAGNOSIS — M62830 Muscle spasm of back: Secondary | ICD-10-CM

## 2017-12-26 DIAGNOSIS — M5441 Lumbago with sciatica, right side: Secondary | ICD-10-CM

## 2017-12-26 DIAGNOSIS — M6281 Muscle weakness (generalized): Secondary | ICD-10-CM | POA: Diagnosis not present

## 2017-12-26 DIAGNOSIS — R262 Difficulty in walking, not elsewhere classified: Secondary | ICD-10-CM

## 2017-12-26 DIAGNOSIS — M5442 Lumbago with sciatica, left side: Secondary | ICD-10-CM

## 2017-12-26 NOTE — Therapy (Signed)
[facility] [address] [place], [place], [address] Phone: [phone]   Fax:  [phone]  Physical Therapy Treatment  Patient Details  Name: [name] MRN: [id number] Date of Birth: Dec 08, 1932 Referring Provider: Ellene Route   Encounter Date: 12/26/2017  PT End of Session - 12/26/17 1516    Visit Number  23    Date for PT Re-Evaluation  12/29/17    PT Start Time  1440    PT Stop Time  1520    PT Time Calculation (min)  40 min    Activity Tolerance  Patient tolerated treatment well    Behavior During Therapy  [facility] for tasks assessed/performed       Past Medical History:  Diagnosis Date  . Arthritis   . GERD (gastroesophageal reflux disease)   . History of blood transfusion   . Hypothyroidism   . PONV (postoperative nausea and vomiting)   . Ptosis of both eyelids   . Vertigo   . Wears glasses    reading    Past Surgical History:  Procedure Laterality Date  . ABDOMINAL HYSTERECTOMY  1986  . APPLICATION OF ROBOTIC ASSISTANCE FOR SPINAL PROCEDURE N/A 07/17/2017   Procedure: APPLICATION OF ROBOTIC ASSISTANCE FOR SPINAL PROCEDURE;  Surgeon: Kristeen Miss, MD;  Location: [place];  Service: Neurosurgery;  Laterality: N/A;  . APPLICATION OF ROBOTIC ASSISTANCE FOR SPINAL PROCEDURE N/A 09/01/2017   Procedure: APPLICATION OF ROBOTIC ASSISTANCE FOR SPINAL PROCEDURE;  Surgeon: Kristeen Miss, MD;  Location: [place];  Service: Neurosurgery;  Laterality: N/A;  . [place]  2000   right  . CARPAL TUNNEL RELEASE  2012   left  . COLONOSCOPY    . CYSTOCELE REPAIR  2007   sling  . DISTAL INTERPHALANGEAL JOINT FUSION Right 02/04/2014   Procedure: FUSION DISTAL INTERPHALANGEAL JOINT RIGHT INDEX FINGER ;  Surgeon: Daryll Brod, MD;  Location: [place];  Service: Orthopedics;  Laterality: Right;  . EYE SURGERY Bilateral    Cataract surgery with lens implant  . KNEE  SURGERY  2009,2011   partial knee  . OPEN REDUCTION INTERNAL FIXATION (ORIF) PROXIMAL PHALANX Left 08/30/2013   Procedure: OPEN REDUCTION INTERNAL FIXATION (ORIF) PROXIMAL PHALANX FRACTURE LEFT SMALL FINGER; SPLINT RING FINGER;  Surgeon: Wynonia Sours, MD;  Location: [place];  Service: Orthopedics;  Laterality: Left;  . POSTERIOR LUMBAR FUSION 4 LEVEL N/A 07/17/2017   Procedure: Thoracic Ten - Lumbar Five revison of hardware with Mazor;  Surgeon: Kristeen Miss, MD;  Location: [place];  Service: Neurosurgery;  Laterality: N/A;  Thoracic/Lumbar  . PTOSIS REPAIR Bilateral 07/27/2015   Procedure: PTOSIS REPAIR;  Surgeon: Cristine Polio, MD;  Location: [place];  Service: Plastics;  Laterality: Bilateral;  . SHOULDER SURGERY     rt rcr,and lt  . TONSILLECTOMY      There were no vitals filed for this visit.  Subjective Assessment - 12/26/17 1453    Subjective  Patient reports that she is worn out, reports that she had two appointments today.  Reports that she is dragging    Currently in Pain?  Yes    Pain Score  2     Pain Location  Back    Pain Orientation  Lower                       [facility] Adult PT  Treatment/Exercise - 12/26/17 0001      Lumbar Exercises: Aerobic   Nustep  level 6 x 6 minutes      Lumbar Exercises: Supine   Other Supine Lumbar Exercises  SAQ 7.5# 2x15, active motions right ankle in supine inversion and eversion needs a lot of help to not rotate hip    Other Supine Lumbar Exercises  feet on ball K2C, trunk rotation, bridges and abdominals, DF for ankle especially rihgt, green tband ankle PF, bridge with green tband clamshells      Electrical Stimulation   Electrical Stimulation Location  right anterior tibialis with her in supine working on [employer]    Clinical biochemist Parameters  4 on 12 off    Electrical Stimulation Goals  Neuromuscular facilitation               PT  Short Term Goals - 10/16/17 1144      PT SHORT TERM GOAL #1   Title  independent with initial HEP    Status  Achieved        PT Long Term Goals - 12/19/17 1435      PT LONG TERM GOAL #1   Title  decrease pain 50%      PT LONG TERM GOAL #2   Title  increase Lumbar ROM 25%    Status  Achieved      PT LONG TERM GOAL #3   Title  increase LE strength to 4+/5    Status  Partially Met      PT LONG TERM GOAL #4   Title  go up and down stairs step over step    Status  Partially Met      PT LONG TERM GOAL #5   Title  walk 600 feet without assistive device    Status  On-going            Plan - 12/26/17 1516    Clinical Impression Statement  Patient was able to do side lying hip abductio with the right LE, when we tried this a month ago I had to use tband to raise the leg, she also can do some inversion and eversion if PT stabilizes the hip, she fatigues after about 3-4 reps and then starts using all the hip mms to move the ankle.      PT Next Visit Plan  would continue to work on strength, gait and function as well as safety and balance    Consulted and Agree with Plan of Care  Patient       Patient will benefit from skilled therapeutic intervention in order to improve the following deficits and impairments:  Abnormal gait, Cardiopulmonary status limiting activity, Decreased activity tolerance, Decreased balance, Decreased mobility, Decreased strength, Postural dysfunction, Improper body mechanics, Impaired flexibility, Pain, Increased muscle spasms, Decreased range of motion, Difficulty walking  Visit Diagnosis: Muscle weakness (generalized)  Acute bilateral low back pain with bilateral sciatica  Difficulty in walking, not elsewhere classified  Muscle spasm of back     Problem List Patient Active Problem List   Diagnosis Date Noted  . Osteoarthritis of right knee   . Vertigo   . DDD (degenerative disc disease), cervical   . Benign essential HTN   . History of  DVT (deep vein thrombosis)   . Tachycardia   . Steroid-induced hyperglycemia   . Hypothyroidism   . Neuropathic pain   . Acute blood loss anemia   . S/P  lumbar fusion   . Closed L5 vertebral fracture ([place]) 08/31/2017  . Closed fracture of fifth lumbar vertebra ([place]) 08/30/2017  . Lumbar vertebral fracture ([place]) 07/17/2017  . Scoliosis 06/13/2017    Sumner Boast., PT 12/26/2017, 3:18 PM  [facility] [address] [place], [place], [address] Phone: [phone]   Fax:  [phone]  Name: [name] MRN: [id number] Date of Birth: 29-Jun-1932

## 2017-12-28 ENCOUNTER — Encounter: Payer: Self-pay | Admitting: Physical Therapy

## 2017-12-28 ENCOUNTER — Ambulatory Visit: Payer: Medicare Other | Admitting: Physical Therapy

## 2017-12-28 DIAGNOSIS — M6281 Muscle weakness (generalized): Secondary | ICD-10-CM | POA: Diagnosis not present

## 2017-12-28 DIAGNOSIS — M62830 Muscle spasm of back: Secondary | ICD-10-CM

## 2017-12-28 DIAGNOSIS — M5442 Lumbago with sciatica, left side: Secondary | ICD-10-CM

## 2017-12-28 DIAGNOSIS — M5441 Lumbago with sciatica, right side: Secondary | ICD-10-CM

## 2017-12-28 DIAGNOSIS — R262 Difficulty in walking, not elsewhere classified: Secondary | ICD-10-CM

## 2017-12-28 NOTE — Therapy (Signed)
[facility] [place], [place], [address] Phone: [phone]   Fax:  [phone]  Physical Therapy Treatment  Patient Details  Name: [name] MRN: [id number] Date of Birth: 12-08-1932 Referring Provider (PT): Elsner   Encounter Date: 12/28/2017  PT End of Session - 12/28/17 1652    Visit Number  24    Date for PT Re-Evaluation  01/29/18    PT Start Time  1605    PT Stop Time  1705    PT Time Calculation (min)  60 min    Activity Tolerance  Patient tolerated treatment well    Behavior During Therapy  [facility] for tasks assessed/performed       Past Medical History:  Diagnosis Date  . Arthritis   . GERD (gastroesophageal reflux disease)   . History of blood transfusion   . Hypothyroidism   . PONV (postoperative nausea and vomiting)   . Ptosis of both eyelids   . Vertigo   . Wears glasses    reading    Past Surgical History:  Procedure Laterality Date  . ABDOMINAL HYSTERECTOMY  1986  . APPLICATION OF ROBOTIC ASSISTANCE FOR SPINAL PROCEDURE N/A 07/17/2017   Procedure: APPLICATION OF ROBOTIC ASSISTANCE FOR SPINAL PROCEDURE;  Surgeon: Kristeen Miss, MD;  Location: [place];  Service: Neurosurgery;  Laterality: N/A;  . APPLICATION OF ROBOTIC ASSISTANCE FOR SPINAL PROCEDURE N/A 09/01/2017   Procedure: APPLICATION OF ROBOTIC ASSISTANCE FOR SPINAL PROCEDURE;  Surgeon: Kristeen Miss, MD;  Location: [place];  Service: Neurosurgery;  Laterality: N/A;  . [place]  2000   right  . CARPAL TUNNEL RELEASE  2012   left  . COLONOSCOPY    . CYSTOCELE REPAIR  2007   sling  . DISTAL INTERPHALANGEAL JOINT FUSION Right 02/04/2014   Procedure: FUSION DISTAL INTERPHALANGEAL JOINT RIGHT INDEX FINGER ;  Surgeon: Daryll Brod, MD;  Location: [place];  Service: Orthopedics;  Laterality: Right;  . EYE SURGERY Bilateral    Cataract surgery with lens implant  .  KNEE SURGERY  2009,2011   partial knee  . OPEN REDUCTION INTERNAL FIXATION (ORIF) PROXIMAL PHALANX Left 08/30/2013   Procedure: OPEN REDUCTION INTERNAL FIXATION (ORIF) PROXIMAL PHALANX FRACTURE LEFT SMALL FINGER; SPLINT RING FINGER;  Surgeon: Wynonia Sours, MD;  Location: [place];  Service: Orthopedics;  Laterality: Left;  . POSTERIOR LUMBAR FUSION 4 LEVEL N/A 07/17/2017   Procedure: Thoracic Ten - Lumbar Five revison of hardware with Mazor;  Surgeon: Kristeen Miss, MD;  Location: [place];  Service: Neurosurgery;  Laterality: N/A;  Thoracic/Lumbar  . PTOSIS REPAIR Bilateral 07/27/2015   Procedure: PTOSIS REPAIR;  Surgeon: Cristine Polio, MD;  Location: [place];  Service: Plastics;  Laterality: Bilateral;  . SHOULDER SURGERY     rt rcr,and lt  . TONSILLECTOMY      There were no vitals filed for this visit.  Subjective Assessment - 12/28/17 1649    Subjective  Patient brought in MD note, I scanned it in the chart, he wants to continue PT and continue the stimulation for the nerve and the DF.    Currently in Pain?  No/denies                       [facility] Adult PT Treatment/Exercise - 12/28/17 0001      Ambulation/Gait   Gait Comments  ambulate with SPC x2 x150 feet, then ambulate with SPC x 150 feet.  slow SBA      High Level Balance   High Level Balance Comments  8" toe touches and stp ups, ball toss and ball kicks with using two canes      Lumbar Exercises: Aerobic   Recumbent Bike  level 1 x 6 minutes      Lumbar Exercises: Machines for Strengthening   Leg Press  50# both legs smaller ROM no knee pain, right only small ROM 20-# and then without weight      Lumbar Exercises: Standing   Other Standing Lumbar Exercises  back to wall overhead lift      Lumbar Exercises: Seated   Other Seated Lumbar Exercises  worked on right ankle DF foot down and foot propped up, needed AAROM, also worked some on Nurse, mental health, and then foot on dyna disc,  needing a lot of assist to decreaed hip Aeronautical engineer Location  right anterior tibialis with her in supine working on [employer]    Clinical biochemist Parameters  4 on and 12 off    Electrical Stimulation Goals  Neuromuscular facilitation               PT Short Term Goals - 10/16/17 1144      PT SHORT TERM GOAL #1   Title  independent with initial HEP    Status  Achieved        PT Long Term Goals - 12/28/17 1809      PT LONG TERM GOAL #1   Title  decrease pain 50%    Status  Achieved      PT LONG TERM GOAL #2   Title  increase Lumbar ROM 25%    Status  Achieved      PT LONG TERM GOAL #3   Title  increase LE strength to 4+/5    Status  Partially Met      PT LONG TERM GOAL #4   Title  go up and down stairs step over step    Status  Partially Met      PT LONG TERM GOAL #5   Title  walk 600 feet without assistive device    Status  On-going            Plan - 12/28/17 1653    Clinical Impression Statement  Patient much better with SPC walking, less cues for posture and foot pick up, she was able to hold very good posture throughout the gait with much less cues, when tired she was really having some toe dragging and stooping posture.  The right ankle DF is 1/5 but seems to be able to get to almost neutral when in supine and using the stimulator    PT Treatment/Interventions  ADLs/Self Care Home Management;Cryotherapy;Electrical Stimulation;Moist Heat;Gait training;Functional mobility training;Therapeutic activities;Stair training;Therapeutic exercise;Balance training;Neuromuscular re-education;Manual techniques;Patient/family education    PT Next Visit Plan  continue to focus on independent, may try an off shelf AFO, but I am hesitant since she does have some DF, but the AFO would make her safer    Consulted and Agree with Plan of Care  Patient       Patient will benefit  from skilled therapeutic intervention in order to improve the following deficits and impairments:  Abnormal gait, Cardiopulmonary status limiting activity, Decreased activity tolerance, Decreased balance, Decreased mobility, Decreased strength,  Postural dysfunction, Improper body mechanics, Impaired flexibility, Pain, Increased muscle spasms, Decreased range of motion, Difficulty walking  Visit Diagnosis: Muscle weakness (generalized) - Plan: PT plan of care cert/re-cert  Acute bilateral low back pain with bilateral sciatica - Plan: PT plan of care cert/re-cert  Difficulty in walking, not elsewhere classified - Plan: PT plan of care cert/re-cert  Muscle spasm of back - Plan: PT plan of care cert/re-cert     Problem List Patient Active Problem List   Diagnosis Date Noted  . Osteoarthritis of right knee   . Vertigo   . DDD (degenerative disc disease), cervical   . Benign essential HTN   . History of DVT (deep vein thrombosis)   . Tachycardia   . Steroid-induced hyperglycemia   . Hypothyroidism   . Neuropathic pain   . Acute blood loss anemia   . S/P lumbar fusion   . Closed L5 vertebral fracture ([place]) 08/31/2017  . Closed fracture of fifth lumbar vertebra ([place]) 08/30/2017  . Lumbar vertebral fracture ([place]) 07/17/2017  . Scoliosis 06/13/2017    Sumner Boast., PT 12/28/2017, 6:11 PM  [address] [place], [place], [address] Phone: [phone]   Fax:  [phone]  Name: [name] MRN: [id number] Date of Birth: [birth year]

## 2018-01-02 ENCOUNTER — Ambulatory Visit: Payer: Medicare Other | Attending: Internal Medicine | Admitting: Physical Therapy

## 2018-01-02 ENCOUNTER — Encounter: Payer: Self-pay | Admitting: Physical Therapy

## 2018-01-02 DIAGNOSIS — M5442 Lumbago with sciatica, left side: Secondary | ICD-10-CM | POA: Insufficient documentation

## 2018-01-02 DIAGNOSIS — M62830 Muscle spasm of back: Secondary | ICD-10-CM

## 2018-01-02 DIAGNOSIS — R262 Difficulty in walking, not elsewhere classified: Secondary | ICD-10-CM | POA: Diagnosis present

## 2018-01-02 DIAGNOSIS — M5441 Lumbago with sciatica, right side: Secondary | ICD-10-CM | POA: Diagnosis present

## 2018-01-02 DIAGNOSIS — M6281 Muscle weakness (generalized): Secondary | ICD-10-CM

## 2018-01-02 NOTE — Therapy (Signed)
[facility] [address] [place] [place], [place], [address] Phone: [phone]   Fax:  [phone]  Physical Therapy Treatment  Patient Details  Name: [name] MRN: [id number] Date of Birth: 1932-10-21 Referring Provider (PT): Elsner   Encounter Date: 01/02/2018  PT End of Session - 01/02/18 1434    Visit Number  25    Date for PT Re-Evaluation  01/29/18    PT Start Time  1345    PT Stop Time  1445    PT Time Calculation (min)  60 min    Activity Tolerance  Patient tolerated treatment well    Behavior During Therapy  [facility] for tasks assessed/performed       Past Medical History:  Diagnosis Date  . Arthritis   . GERD (gastroesophageal reflux disease)   . History of blood transfusion   . Hypothyroidism   . PONV (postoperative nausea and vomiting)   . Ptosis of both eyelids   . Vertigo   . Wears glasses    reading    Past Surgical History:  Procedure Laterality Date  . ABDOMINAL HYSTERECTOMY  1986  . APPLICATION OF ROBOTIC ASSISTANCE FOR SPINAL PROCEDURE N/A 07/17/2017   Procedure: APPLICATION OF ROBOTIC ASSISTANCE FOR SPINAL PROCEDURE;  Surgeon: Kristeen Miss, MD;  Location: [place];  Service: Neurosurgery;  Laterality: N/A;  . APPLICATION OF ROBOTIC ASSISTANCE FOR SPINAL PROCEDURE N/A 09/01/2017   Procedure: APPLICATION OF ROBOTIC ASSISTANCE FOR SPINAL PROCEDURE;  Surgeon: Kristeen Miss, MD;  Location: [place];  Service: Neurosurgery;  Laterality: N/A;  . [place]  2000   right  . CARPAL TUNNEL RELEASE  2012   left  . COLONOSCOPY    . CYSTOCELE REPAIR  2007   sling  . DISTAL INTERPHALANGEAL JOINT FUSION Right 02/04/2014   Procedure: FUSION DISTAL INTERPHALANGEAL JOINT RIGHT INDEX FINGER ;  Surgeon: Daryll Brod, MD;  Location: [place];  Service: Orthopedics;  Laterality: Right;  . EYE SURGERY Bilateral    Cataract surgery with lens implant  .  KNEE SURGERY  2009,2011   partial knee  . OPEN REDUCTION INTERNAL FIXATION (ORIF) PROXIMAL PHALANX Left 08/30/2013   Procedure: OPEN REDUCTION INTERNAL FIXATION (ORIF) PROXIMAL PHALANX FRACTURE LEFT SMALL FINGER; SPLINT RING FINGER;  Surgeon: Wynonia Sours, MD;  Location: [place];  Service: Orthopedics;  Laterality: Left;  . POSTERIOR LUMBAR FUSION 4 LEVEL N/A 07/17/2017   Procedure: Thoracic Ten - Lumbar Five revison of hardware with Mazor;  Surgeon: Kristeen Miss, MD;  Location: [place];  Service: Neurosurgery;  Laterality: N/A;  Thoracic/Lumbar  . PTOSIS REPAIR Bilateral 07/27/2015   Procedure: PTOSIS REPAIR;  Surgeon: Cristine Polio, MD;  Location: [place];  Service: Plastics;  Laterality: Bilateral;  . SHOULDER SURGERY     rt rcr,and lt  . TONSILLECTOMY      There were no vitals filed for this visit.  Subjective Assessment - 01/02/18 1406    Subjective  Patient reports that her low back was very sore yesterday    Currently in Pain?  No/denies                       [facility] Adult PT Treatment/Exercise - 01/02/18 0001      Ambulation/Gait   Gait Comments  ambulate with SPC x2 x150 feet, then ambulate with SPC x 150 feet.  slow SBA,  cues to llok up      Lumbar Exercises: Machines for Strengthening   Cybex Knee Extension  5# right only 3x10    Cybex Knee Flexion  right only 15# 3x10    Leg Press  60# 3x10 small motions to decrease stress on the right knee      Lumbar Exercises: Standing   Other Standing Lumbar Exercises  back to wall overhead lift, 6" 2.5 # weight on ankle clearing foot to the side      Lumbar Exercises: Seated   Other Seated Lumbar Exercises  worked on right ankle DF foot down and foot propped up, needed AAROM, also worked some on Nurse, mental health, and then foot on dyna disc, needing a lot of assist to decreaed hip compensation    Other Seated Lumbar Exercises  2.5# hip flexion and knee extension      Electrical  Stimulation   Electrical Stimulation Location  right anterior tibialis with her in supine working on [employer]    Clinical biochemist Parameters  4on/12off    Printmaker Goals  Neuromuscular facilitation               PT Short Term Goals - 10/16/17 1144      PT SHORT TERM GOAL #1   Title  independent with initial HEP    Status  Achieved        PT Long Term Goals - 12/28/17 1809      PT LONG TERM GOAL #1   Title  decrease pain 50%    Status  Achieved      PT LONG TERM GOAL #2   Title  increase Lumbar ROM 25%    Status  Achieved      PT LONG TERM GOAL #3   Title  increase LE strength to 4+/5    Status  Partially Met      PT LONG TERM GOAL #4   Title  go up and down stairs step over step    Status  Partially Met      PT LONG TERM GOAL #5   Title  walk 600 feet without assistive device    Status  On-going            Plan - 01/02/18 1435    Clinical Impression Statement  I again tried the AFO, it is a medium, she does not wear socks so I had one that we tried , the medium AFO was too small for her calf and foot.  She still is needing the cues when she is fatigued, seems to be doing better with her overall walking and strength    PT Next Visit Plan  continue to focus on independent, may try an off shelf AFO, but I am hesitant since she does have some DF, but the AFO would make her safer    Consulted and Agree with Plan of Care  Patient       Patient will benefit from skilled therapeutic intervention in order to improve the following deficits and impairments:  Abnormal gait, Cardiopulmonary status limiting activity, Decreased activity tolerance, Decreased balance, Decreased mobility, Decreased strength, Postural dysfunction, Improper body mechanics, Impaired flexibility, Pain, Increased muscle spasms, Decreased range of motion, Difficulty walking  Visit Diagnosis: Muscle weakness (generalized)  Acute  bilateral low back pain with bilateral sciatica  Difficulty in walking, not elsewhere classified  Muscle spasm of back     Problem List Patient Active Problem List  Diagnosis Date Noted  . Osteoarthritis of right knee   . Vertigo   . DDD (degenerative disc disease), cervical   . Benign essential HTN   . History of DVT (deep vein thrombosis)   . Tachycardia   . Steroid-induced hyperglycemia   . Hypothyroidism   . Neuropathic pain   . Acute blood loss anemia   . S/P lumbar fusion   . Closed L5 vertebral fracture ([place]) 08/31/2017  . Closed fracture of fifth lumbar vertebra ([place]) 08/30/2017  . Lumbar vertebral fracture ([place]) 07/17/2017  . Scoliosis 06/13/2017    Sumner Boast., PT 01/02/2018, 2:41 PM  [place] [address] [address] [place], [place], [address] Phone: [phone]   Fax:  [phone]  Name: [name] MRN: [id number] Date of Birth: Jun 12, 1932

## 2018-01-04 ENCOUNTER — Ambulatory Visit: Payer: Medicare Other | Admitting: Physical Therapy

## 2018-01-04 ENCOUNTER — Encounter: Payer: Self-pay | Admitting: Physical Therapy

## 2018-01-04 DIAGNOSIS — M6281 Muscle weakness (generalized): Secondary | ICD-10-CM | POA: Diagnosis not present

## 2018-01-04 DIAGNOSIS — M5441 Lumbago with sciatica, right side: Secondary | ICD-10-CM

## 2018-01-04 DIAGNOSIS — M62830 Muscle spasm of back: Secondary | ICD-10-CM

## 2018-01-04 DIAGNOSIS — R262 Difficulty in walking, not elsewhere classified: Secondary | ICD-10-CM

## 2018-01-04 DIAGNOSIS — M5442 Lumbago with sciatica, left side: Secondary | ICD-10-CM

## 2018-01-04 NOTE — Therapy (Signed)
[place], [place], [address] Phone: [phone]   Fax:  [phone]  Physical Therapy Treatment  Patient Details  Name: [name] MRN: [id number] Date of Birth: 12-Apr-1932 Referring Provider (PT): Elsner   Encounter Date: 01/04/2018  PT End of Session - 01/04/18 1502    Visit Number  26    Date for PT Re-Evaluation  01/29/18    PT Start Time  1345    PT Stop Time  1500    PT Time Calculation (min)  75 min    Activity Tolerance  Patient tolerated treatment well    Behavior During Therapy  [facility] for tasks assessed/performed       Past Medical History:  Diagnosis Date  . Arthritis   . GERD (gastroesophageal reflux disease)   . History of blood transfusion   . Hypothyroidism   . PONV (postoperative nausea and vomiting)   . Ptosis of both eyelids   . Vertigo   . Wears glasses    reading    Past Surgical History:  Procedure Laterality Date  . ABDOMINAL HYSTERECTOMY  1986  . APPLICATION OF ROBOTIC ASSISTANCE FOR SPINAL PROCEDURE N/A 07/17/2017   Procedure: APPLICATION OF ROBOTIC ASSISTANCE FOR SPINAL PROCEDURE;  Surgeon: Kristeen Miss, MD;  Location: [place];  Service: Neurosurgery;  Laterality: N/A;  . APPLICATION OF ROBOTIC ASSISTANCE FOR SPINAL PROCEDURE N/A 09/01/2017   Procedure: APPLICATION OF ROBOTIC ASSISTANCE FOR SPINAL PROCEDURE;  Surgeon: Kristeen Miss, MD;  Location: [place];  Service: Neurosurgery;  Laterality: N/A;  . [place]  2000   right  . CARPAL TUNNEL RELEASE  2012   left  . COLONOSCOPY    . CYSTOCELE REPAIR  2007   sling  . DISTAL INTERPHALANGEAL JOINT FUSION Right 02/04/2014   Procedure: FUSION DISTAL INTERPHALANGEAL JOINT RIGHT INDEX FINGER ;  Surgeon: Daryll Brod, MD;  Location: [place];  Service: Orthopedics;  Laterality: Right;  . EYE SURGERY Bilateral    Cataract surgery with lens implant  .  KNEE SURGERY  2009,2011   partial knee  . OPEN REDUCTION INTERNAL FIXATION (ORIF) PROXIMAL PHALANX Left 08/30/2013   Procedure: OPEN REDUCTION INTERNAL FIXATION (ORIF) PROXIMAL PHALANX FRACTURE LEFT SMALL FINGER; SPLINT RING FINGER;  Surgeon: Wynonia Sours, MD;  Location: [place];  Service: Orthopedics;  Laterality: Left;  . POSTERIOR LUMBAR FUSION 4 LEVEL N/A 07/17/2017   Procedure: Thoracic Ten - Lumbar Five revison of hardware with Mazor;  Surgeon: Kristeen Miss, MD;  Location: [place];  Service: Neurosurgery;  Laterality: N/A;  Thoracic/Lumbar  . PTOSIS REPAIR Bilateral 07/27/2015   Procedure: PTOSIS REPAIR;  Surgeon: Cristine Polio, MD;  Location: [place];  Service: Plastics;  Laterality: Bilateral;  . SHOULDER SURGERY     rt rcr,and lt  . TONSILLECTOMY      There were no vitals filed for this visit.  Subjective Assessment - 01/04/18 1459    Subjective  Patient reports that she is feeling like the foot is moving better    Currently in Pain?  No/denies                       [facility] Adult PT Treatment/Exercise - 01/04/18 0001      Lumbar Exercises: Aerobic   Recumbent Bike  level 1 x 6 minutes      Lumbar Exercises:  Machines for Strengthening   Cybex Knee Extension  5# right only 3x10    Cybex Knee Flexion  right only 15# 3x10    Leg Press  60# 3x10 small motions to decrease stress on the right knee      Lumbar Exercises: Standing   Other Standing Lumbar Exercises  back to wall overhead lift, 6" 2.5 # weight on ankle clearing foot to the side, then 2.5 # hip flexion, abduction and extension      Lumbar Exercises: Supine   Other Supine Lumbar Exercises  feet on ball K2C, trunk rotation, bridges and abdominals, DF for ankle especially rihgt, green tband ankle PF, bridge with green tband clamshells      Electrical Stimulation   Electrical Stimulation Location  right anterior tibialis with her in supine working on [employer]    Financial controller Parameters  10 on/30 off    Electrical Stimulation Goals  Neuromuscular facilitation               PT Short Term Goals - 10/16/17 1144      PT SHORT TERM GOAL #1   Title  independent with initial HEP    Status  Achieved        PT Long Term Goals - 01/04/18 1505      PT LONG TERM GOAL #4   Title  go up and down stairs step over step    Status  Partially Met      PT LONG TERM GOAL #5   Title  walk 600 feet without assistive device    Status  On-going            Plan - 01/04/18 1502    Clinical Impression Statement  Patient came in at the wrong time today.  She is demonstrating better DF especially with the estim on, she does fatigue and needs to be stretches at time during the treatment as she reports that this feels "really good" she is reporting less neuralgia type pain in the foot.    PT Next Visit Plan  work on gait, DF, overall safe function    Consulted and Agree with Plan of Care  Patient       Patient will benefit from skilled therapeutic intervention in order to improve the following deficits and impairments:  Abnormal gait, Cardiopulmonary status limiting activity, Decreased activity tolerance, Decreased balance, Decreased mobility, Decreased strength, Postural dysfunction, Improper body mechanics, Impaired flexibility, Pain, Increased muscle spasms, Decreased range of motion, Difficulty walking  Visit Diagnosis: Muscle weakness (generalized)  Acute bilateral low back pain with bilateral sciatica  Difficulty in walking, not elsewhere classified  Muscle spasm of back     Problem List Patient Active Problem List   Diagnosis Date Noted  . Osteoarthritis of right knee   . Vertigo   . DDD (degenerative disc disease), cervical   . Benign essential HTN   . History of DVT (deep vein thrombosis)   . Tachycardia   . Steroid-induced hyperglycemia   . Hypothyroidism   . Neuropathic pain   .  Acute blood loss anemia   . S/P lumbar fusion   . Closed L5 vertebral fracture ([place]) 08/31/2017  . Closed fracture of fifth lumbar vertebra ([place]) 08/30/2017  . Lumbar vertebral fracture ([place]) 07/17/2017  . Scoliosis 06/13/2017    [name]., PT 01/04/2018, 3:06 PM  [place], [place],  [address] Phone: [phone]   Fax:  [phone]  Name: [name] MRN: [id number] Date of Birth: 06-Jun-1932

## 2018-01-09 ENCOUNTER — Ambulatory Visit: Payer: Medicare Other | Admitting: Physical Therapy

## 2018-01-09 ENCOUNTER — Encounter: Payer: Self-pay | Admitting: Physical Therapy

## 2018-01-09 DIAGNOSIS — M6281 Muscle weakness (generalized): Secondary | ICD-10-CM

## 2018-01-09 DIAGNOSIS — M62830 Muscle spasm of back: Secondary | ICD-10-CM

## 2018-01-09 DIAGNOSIS — M5442 Lumbago with sciatica, left side: Secondary | ICD-10-CM

## 2018-01-09 DIAGNOSIS — R262 Difficulty in walking, not elsewhere classified: Secondary | ICD-10-CM

## 2018-01-09 DIAGNOSIS — M5441 Lumbago with sciatica, right side: Secondary | ICD-10-CM

## 2018-01-09 NOTE — Therapy (Signed)
[place], [place], [address] Phone: [phone]   Fax:  [phone]  Physical Therapy Treatment  Patient Details  Name: [name] MRN: [id number] Date of Birth: [birth year] Referring Provider (PT): Elsner   Encounter Date: 01/09/2018  PT End of Session - 01/09/18 1049    Visit Number  27    Date for PT Re-Evaluation  01/29/18    PT Start Time  1003    PT Stop Time  1100    PT Time Calculation (min)  57 min    Activity Tolerance  Patient tolerated treatment well    Behavior During Therapy  [facility] for tasks assessed/performed       Past Medical History:  Diagnosis Date  . Arthritis   . GERD (gastroesophageal reflux disease)   . History of blood transfusion   . Hypothyroidism   . PONV (postoperative nausea and vomiting)   . Ptosis of both eyelids   . Vertigo   . Wears glasses    reading    Past Surgical History:  Procedure Laterality Date  . ABDOMINAL HYSTERECTOMY  1986  . APPLICATION OF ROBOTIC ASSISTANCE FOR SPINAL PROCEDURE N/A 07/17/2017   Procedure: APPLICATION OF ROBOTIC ASSISTANCE FOR SPINAL PROCEDURE;  Surgeon: Kristeen Miss, MD;  Location: [place];  Service: Neurosurgery;  Laterality: N/A;  . APPLICATION OF ROBOTIC ASSISTANCE FOR SPINAL PROCEDURE N/A 09/01/2017   Procedure: APPLICATION OF ROBOTIC ASSISTANCE FOR SPINAL PROCEDURE;  Surgeon: Kristeen Miss, MD;  Location: [place];  Service: Neurosurgery;  Laterality: N/A;  . [place]  2000   right  . CARPAL TUNNEL RELEASE  2012   left  . COLONOSCOPY    . CYSTOCELE REPAIR  2007   sling  . DISTAL INTERPHALANGEAL JOINT FUSION Right 02/04/2014   Procedure: FUSION DISTAL INTERPHALANGEAL JOINT RIGHT INDEX FINGER ;  Surgeon: Daryll Brod, MD;  Location: [place];  Service: Orthopedics;  Laterality: Right;  . EYE SURGERY Bilateral    Cataract surgery with lens implant  .  KNEE SURGERY  2009,2011   partial knee  . OPEN REDUCTION INTERNAL FIXATION (ORIF) PROXIMAL PHALANX Left 08/30/2013   Procedure: OPEN REDUCTION INTERNAL FIXATION (ORIF) PROXIMAL PHALANX FRACTURE LEFT SMALL FINGER; SPLINT RING FINGER;  Surgeon: Wynonia Sours, MD;  Location: [place];  Service: Orthopedics;  Laterality: Left;  . POSTERIOR LUMBAR FUSION 4 LEVEL N/A 07/17/2017   Procedure: Thoracic Ten - Lumbar Five revison of hardware with Mazor;  Surgeon: Kristeen Miss, MD;  Location: [place];  Service: Neurosurgery;  Laterality: N/A;  Thoracic/Lumbar  . PTOSIS REPAIR Bilateral 07/27/2015   Procedure: PTOSIS REPAIR;  Surgeon: Cristine Polio, MD;  Location: [place];  Service: Plastics;  Laterality: Bilateral;  . SHOULDER SURGERY     rt rcr,and lt  . TONSILLECTOMY      There were no vitals filed for this visit.  Subjective Assessment - 01/09/18 1007    Subjective  Patient reports that she got an injection in her back on Friday, reports having some less pain    Currently in Pain?  No/denies                       [facility] Adult PT Treatment/Exercise - 01/09/18 0001      Ambulation/Gait   Gait Comments  SPC 300 feet some cues for picking up feet,  then did some walking outside negotiating curbs and some inclines, needing CGA, when tired she catches the right toe      High Level Balance   High Level Balance Activities  Negotiating over obstacles    High Level Balance Comments  using SPC with CGA      Lumbar Exercises: Aerobic   Recumbent Bike  level 1 x 6 minutes      Lumbar Exercises: Machines for Strengthening   Cybex Knee Extension  5# right only 3x10    Cybex Knee Flexion  right only 20# 3x10    Leg Press  40# 3x10    Other Lumbar Machine Exercise  seated row and lats 25# 3x10 each    Other Lumbar Machine Exercise  chest press 10# 2x15      Electrical Stimulation   Electrical Stimulation Location  right anterior tibialis with her in supine  working on [employer]  [place]    Electrical Stimulation Parameters  4on/12 off    Electrical Stimulation Goals  Neuromuscular facilitation               PT Short Term Goals - 10/16/17 1144      PT SHORT TERM GOAL #1   Title  independent with initial HEP    Status  Achieved        PT Long Term Goals - 01/04/18 1505      PT LONG TERM GOAL #4   Title  go up and down stairs step over step    Status  Partially Met      PT LONG TERM GOAL #5   Title  walk 600 feet without assistive device    Status  On-going            Plan - 01/09/18 1050    Clinical Impression Statement  I only allowed her to use the [facility] in the clinic today.  She did well but again with fatigue she really has to have CGA due to catching the right toe.  She was able to do the curbs and uneven surface with SPC and CGA, tried stairs step over step unable to do going down due to knee pain, able to do up with CGA, had difficulty stepping over objects    PT Next Visit Plan  work on gait, DF, overall safe function    Consulted and Agree with Plan of Care  Patient       Patient will benefit from skilled therapeutic intervention in order to improve the following deficits and impairments:  Abnormal gait, Cardiopulmonary status limiting activity, Decreased activity tolerance, Decreased balance, Decreased mobility, Decreased strength, Postural dysfunction, Improper body mechanics, Impaired flexibility, Pain, Increased muscle spasms, Decreased range of motion, Difficulty walking  Visit Diagnosis: Muscle weakness (generalized)  Acute bilateral low back pain with bilateral sciatica  Difficulty in walking, not elsewhere classified  Muscle spasm of back     Problem List Patient Active Problem List   Diagnosis Date Noted  . Osteoarthritis of right knee   . Vertigo   . DDD (degenerative disc disease), cervical   . Benign essential HTN   . History of DVT (deep vein thrombosis)    . Tachycardia   . Steroid-induced hyperglycemia   . Hypothyroidism   . Neuropathic pain   . Acute blood loss anemia   . S/P lumbar fusion   . Closed L5 vertebral fracture ([place]) 08/31/2017  . Closed fracture of fifth lumbar vertebra ([place]) 08/30/2017  .  Lumbar vertebral fracture ([place]) 07/17/2017  . Scoliosis 06/13/2017    Sumner Boast., PT 01/09/2018, 10:53 AM  [place] [address] [place], [place], [address] Phone: [phone]   Fax:  [phone]  Name: [name] MRN: [id number] Date of Birth: 1932-05-23

## 2018-01-11 ENCOUNTER — Ambulatory Visit: Payer: Medicare Other | Admitting: Physical Therapy

## 2018-01-11 ENCOUNTER — Encounter: Payer: Self-pay | Admitting: Physical Therapy

## 2018-01-11 DIAGNOSIS — R262 Difficulty in walking, not elsewhere classified: Secondary | ICD-10-CM

## 2018-01-11 DIAGNOSIS — M62830 Muscle spasm of back: Secondary | ICD-10-CM

## 2018-01-11 DIAGNOSIS — M6281 Muscle weakness (generalized): Secondary | ICD-10-CM

## 2018-01-11 DIAGNOSIS — M5442 Lumbago with sciatica, left side: Secondary | ICD-10-CM

## 2018-01-11 DIAGNOSIS — M5441 Lumbago with sciatica, right side: Secondary | ICD-10-CM

## 2018-01-11 NOTE — Therapy (Signed)
[place], [place], [address] Phone: [phone]   Fax:  [phone]  Physical Therapy Treatment  Patient Details  Name: [name] MRN: [id number] Date of Birth: Jun 26, 1932 Referring Provider (PT): Elsner   Encounter Date: 01/11/2018  PT End of Session - 01/11/18 1513    Visit Number  28    Date for PT Re-Evaluation  01/29/18    PT Start Time  1435    PT Stop Time  1520    PT Time Calculation (min)  45 min    Activity Tolerance  Patient tolerated treatment well    Behavior During Therapy  [facility] for tasks assessed/performed       Past Medical History:  Diagnosis Date  . Arthritis   . GERD (gastroesophageal reflux disease)   . History of blood transfusion   . Hypothyroidism   . PONV (postoperative nausea and vomiting)   . Ptosis of both eyelids   . Vertigo   . Wears glasses    reading    Past Surgical History:  Procedure Laterality Date  . ABDOMINAL HYSTERECTOMY  1986  . APPLICATION OF ROBOTIC ASSISTANCE FOR SPINAL PROCEDURE N/A 07/17/2017   Procedure: APPLICATION OF ROBOTIC ASSISTANCE FOR SPINAL PROCEDURE;  Surgeon: Kristeen Miss, MD;  Location: [place];  Service: Neurosurgery;  Laterality: N/A;  . APPLICATION OF ROBOTIC ASSISTANCE FOR SPINAL PROCEDURE N/A 09/01/2017   Procedure: APPLICATION OF ROBOTIC ASSISTANCE FOR SPINAL PROCEDURE;  Surgeon: Kristeen Miss, MD;  Location: [place];  Service: Neurosurgery;  Laterality: N/A;  . [place]  2000   right  . CARPAL TUNNEL RELEASE  2012   left  . COLONOSCOPY    . CYSTOCELE REPAIR  2007   sling  . DISTAL INTERPHALANGEAL JOINT FUSION Right 02/04/2014   Procedure: FUSION DISTAL INTERPHALANGEAL JOINT RIGHT INDEX FINGER ;  Surgeon: Daryll Brod, MD;  Location: [place];  Service: Orthopedics;  Laterality: Right;  . EYE SURGERY Bilateral    Cataract surgery with lens implant  .  KNEE SURGERY  2009,2011   partial knee  . OPEN REDUCTION INTERNAL FIXATION (ORIF) PROXIMAL PHALANX Left 08/30/2013   Procedure: OPEN REDUCTION INTERNAL FIXATION (ORIF) PROXIMAL PHALANX FRACTURE LEFT SMALL FINGER; SPLINT RING FINGER;  Surgeon: Wynonia Sours, MD;  Location: [place];  Service: Orthopedics;  Laterality: Left;  . POSTERIOR LUMBAR FUSION 4 LEVEL N/A 07/17/2017   Procedure: Thoracic Ten - Lumbar Five revison of hardware with Mazor;  Surgeon: Kristeen Miss, MD;  Location: [place];  Service: Neurosurgery;  Laterality: N/A;  Thoracic/Lumbar  . PTOSIS REPAIR Bilateral 07/27/2015   Procedure: PTOSIS REPAIR;  Surgeon: Cristine Polio, MD;  Location: [place];  Service: Plastics;  Laterality: Bilateral;  . SHOULDER SURGERY     rt rcr,and lt  . TONSILLECTOMY      There were no vitals filed for this visit.  Subjective Assessment - 01/11/18 1510    Subjective  Patient reports that she had an injection in her knee today.    Currently in Pain?  No/denies                       [facility] Adult PT Treatment/Exercise - 01/11/18 0001      High Level Balance   High Level Balance Comments  Side step over .5 foam roll HHA x2  Exercises   Exercises  Knee/Hip      Lumbar Exercises: Machines for Strengthening   Other Lumbar Machine Exercise  seated row and lats 35# 3x10 each    Other Lumbar Machine Exercise  chest press 10# 2x15      Lumbar Exercises: Standing   Other Standing Lumbar Exercises  standing march      Lumbar Exercises: Seated   Other Seated Lumbar Exercises  Seated Iso with ball 2x10      Knee/Hip Exercises: Stretches   Gastroc Stretch  5 reps;30 seconds    Soleus Stretch  5 reps;30 seconds      Electrical Stimulation   Electrical Stimulation Location  right anterior tibialis with her in supine working on [employer]    Clinical biochemist Parameters  4on/12off    Electrical Stimulation  Goals  Neuromuscular facilitation               PT Short Term Goals - 10/16/17 1144      PT SHORT TERM GOAL #1   Title  independent with initial HEP    Status  Achieved        PT Long Term Goals - 01/11/18 1514      PT LONG TERM GOAL #4   Title  go up and down stairs step over step    Status  Partially Met      PT LONG TERM GOAL #5   Title  walk 600 feet without assistive device    Status  On-going            Plan - 01/11/18 1513    Clinical Impression Statement  Due to her having injection in the knee we opted to not do much exercise with the LE's.  She really feels like she is getting stronger but feels like the balance is her biggest issue.  The DF seems to be moving better    PT Next Visit Plan  work on gait, DF, overall safe function, continue with balance    Consulted and Agree with Plan of Care  Patient       Patient will benefit from skilled therapeutic intervention in order to improve the following deficits and impairments:  Abnormal gait, Cardiopulmonary status limiting activity, Decreased activity tolerance, Decreased balance, Decreased mobility, Decreased strength, Postural dysfunction, Improper body mechanics, Impaired flexibility, Pain, Increased muscle spasms, Decreased range of motion, Difficulty walking  Visit Diagnosis: Muscle weakness (generalized)  Acute bilateral low back pain with bilateral sciatica  Difficulty in walking, not elsewhere classified  Muscle spasm of back     Problem List Patient Active Problem List   Diagnosis Date Noted  . Osteoarthritis of right knee   . Vertigo   . DDD (degenerative disc disease), cervical   . Benign essential HTN   . History of DVT (deep vein thrombosis)   . Tachycardia   . Steroid-induced hyperglycemia   . Hypothyroidism   . Neuropathic pain   . Acute blood loss anemia   . S/P lumbar fusion   . Closed L5 vertebral fracture ([place]) 08/31/2017  . Closed fracture of fifth lumbar vertebra  ([place]) 08/30/2017  . Lumbar vertebral fracture ([place]) 07/17/2017  . Scoliosis 06/13/2017    Sumner Boast., PT 01/11/2018, 3:15 PM  [place] [address] [place], [place], [address] Phone: [phone]   Fax:  [phone]  Name: [name] MRN: [id number] Date of Birth: 07/14/1932

## 2018-01-16 ENCOUNTER — Encounter: Payer: Self-pay | Admitting: Physical Therapy

## 2018-01-16 ENCOUNTER — Ambulatory Visit: Payer: Medicare Other | Admitting: Physical Therapy

## 2018-01-16 DIAGNOSIS — M62830 Muscle spasm of back: Secondary | ICD-10-CM

## 2018-01-16 DIAGNOSIS — M6281 Muscle weakness (generalized): Secondary | ICD-10-CM

## 2018-01-16 DIAGNOSIS — M5442 Lumbago with sciatica, left side: Secondary | ICD-10-CM

## 2018-01-16 DIAGNOSIS — M5441 Lumbago with sciatica, right side: Secondary | ICD-10-CM

## 2018-01-16 DIAGNOSIS — R262 Difficulty in walking, not elsewhere classified: Secondary | ICD-10-CM

## 2018-01-16 NOTE — Therapy (Signed)
[place], [place], [address] Phone: [phone]   Fax:  [phone]  Physical Therapy Treatment  Patient Details  Name: [name] MRN: [id number] Date of Birth: 18-Jun-1932 Referring Provider (PT): Elsner   Encounter Date: 01/16/2018  PT End of Session - 01/16/18 1709    Visit Number  29    Date for PT Re-Evaluation  01/29/18    PT Start Time  9381    PT Stop Time  1530    PT Time Calculation (min)  51 min    Activity Tolerance  Patient tolerated treatment well    Behavior During Therapy  [facility] for tasks assessed/performed       Past Medical History:  Diagnosis Date  . Arthritis   . GERD (gastroesophageal reflux disease)   . History of blood transfusion   . Hypothyroidism   . PONV (postoperative nausea and vomiting)   . Ptosis of both eyelids   . Vertigo   . Wears glasses    reading    Past Surgical History:  Procedure Laterality Date  . ABDOMINAL HYSTERECTOMY  1986  . APPLICATION OF ROBOTIC ASSISTANCE FOR SPINAL PROCEDURE N/A 07/17/2017   Procedure: APPLICATION OF ROBOTIC ASSISTANCE FOR SPINAL PROCEDURE;  Surgeon: Kristeen Miss, MD;  Location: [place];  Service: Neurosurgery;  Laterality: N/A;  . APPLICATION OF ROBOTIC ASSISTANCE FOR SPINAL PROCEDURE N/A 09/01/2017   Procedure: APPLICATION OF ROBOTIC ASSISTANCE FOR SPINAL PROCEDURE;  Surgeon: Kristeen Miss, MD;  Location: [place];  Service: Neurosurgery;  Laterality: N/A;  . [place]  2000   right  . CARPAL TUNNEL RELEASE  2012   left  . COLONOSCOPY    . CYSTOCELE REPAIR  2007   sling  . DISTAL INTERPHALANGEAL JOINT FUSION Right 02/04/2014   Procedure: FUSION DISTAL INTERPHALANGEAL JOINT RIGHT INDEX FINGER ;  Surgeon: Daryll Brod, MD;  Location: [place];  Service: Orthopedics;  Laterality: Right;  . EYE SURGERY Bilateral    Cataract surgery with lens implant  .  KNEE SURGERY  2009,2011   partial knee  . OPEN REDUCTION INTERNAL FIXATION (ORIF) PROXIMAL PHALANX Left 08/30/2013   Procedure: OPEN REDUCTION INTERNAL FIXATION (ORIF) PROXIMAL PHALANX FRACTURE LEFT SMALL FINGER; SPLINT RING FINGER;  Surgeon: Wynonia Sours, MD;  Location: [place];  Service: Orthopedics;  Laterality: Left;  . POSTERIOR LUMBAR FUSION 4 LEVEL N/A 07/17/2017   Procedure: Thoracic Ten - Lumbar Five revison of hardware with Mazor;  Surgeon: Kristeen Miss, MD;  Location: [place];  Service: Neurosurgery;  Laterality: N/A;  Thoracic/Lumbar  . PTOSIS REPAIR Bilateral 07/27/2015   Procedure: PTOSIS REPAIR;  Surgeon: Cristine Polio, MD;  Location: [place];  Service: Plastics;  Laterality: Bilateral;  . SHOULDER SURGERY     rt rcr,and lt  . TONSILLECTOMY      There were no vitals filed for this visit.  Subjective Assessment - 01/16/18 1537    Subjective  Reports that she is walking mostly without a cane at home, inside    Currently in Pain?  No/denies                       [facility] Adult PT Treatment/Exercise - 01/16/18 0001      Ambulation/Gait   Gait Comments  SPC, 180 feet x 2 minimal cues, but close supervision  High Level Balance   High Level Balance Activities  Negotiating over obstacles    High Level Balance Comments  side step over, front heel touches over a roll, ball toss, ball kicks      Knee/Hip Exercises: Stretches   Gastroc Stretch  5 reps;30 seconds    Soleus Stretch  5 reps;30 seconds      Electrical Stimulation   Electrical Stimulation Location  right anterior tibialis with her in supine working on [employer]    Education administrator Parameters  4 on 12 off    Electrical Stimulation Goals  Neuromuscular facilitation               PT Short Term Goals - 10/16/17 1144      PT SHORT TERM GOAL #1   Title  independent with initial HEP    Status  Achieved         PT Long Term Goals - 01/11/18 1514      PT LONG TERM GOAL #4   Title  go up and down stairs step over step    Status  Partially Met      PT LONG TERM GOAL #5   Title  walk 600 feet without assistive device    Status  On-going            Plan - 01/16/18 1804    Clinical Impression Statement  Patient did very well today with walking using a SPC only, she had less isssues with posture and with foot drag, she needs CGA to close supervision with any of the balance activities but this is improving.    PT Next Visit Plan  work on gait, DF, overall safe function, continue with balance    Consulted and Agree with Plan of Care  Patient       Patient will benefit from skilled therapeutic intervention in order to improve the following deficits and impairments:  Abnormal gait, Cardiopulmonary status limiting activity, Decreased activity tolerance, Decreased balance, Decreased mobility, Decreased strength, Postural dysfunction, Improper body mechanics, Impaired flexibility, Pain, Increased muscle spasms, Decreased range of motion, Difficulty walking  Visit Diagnosis: Muscle weakness (generalized)  Acute bilateral low back pain with bilateral sciatica  Difficulty in walking, not elsewhere classified  Muscle spasm of back     Problem List Patient Active Problem List   Diagnosis Date Noted  . Osteoarthritis of right knee   . Vertigo   . DDD (degenerative disc disease), cervical   . Benign essential HTN   . History of DVT (deep vein thrombosis)   . Tachycardia   . Steroid-induced hyperglycemia   . Hypothyroidism   . Neuropathic pain   . Acute blood loss anemia   . S/P lumbar fusion   . Closed L5 vertebral fracture ([place]) 08/31/2017  . Closed fracture of fifth lumbar vertebra ([place]) 08/30/2017  . Lumbar vertebral fracture ([place]) 07/17/2017  . Scoliosis 06/13/2017    Sumner Boast., PT 01/16/2018, 6:05 PM  [place] [address] [address] [place], [place], [address] Phone: [phone]   Fax:  [phone]  Name: [name] MRN: [id number] Date of Birth: 25-Nov-1932

## 2018-01-18 ENCOUNTER — Ambulatory Visit: Payer: Medicare Other | Admitting: Physical Therapy

## 2018-01-18 ENCOUNTER — Encounter: Payer: Self-pay | Admitting: Physical Therapy

## 2018-01-18 DIAGNOSIS — M6281 Muscle weakness (generalized): Secondary | ICD-10-CM | POA: Diagnosis not present

## 2018-01-18 DIAGNOSIS — M5441 Lumbago with sciatica, right side: Secondary | ICD-10-CM

## 2018-01-18 DIAGNOSIS — M5442 Lumbago with sciatica, left side: Secondary | ICD-10-CM

## 2018-01-18 DIAGNOSIS — R262 Difficulty in walking, not elsewhere classified: Secondary | ICD-10-CM

## 2018-01-18 DIAGNOSIS — M62830 Muscle spasm of back: Secondary | ICD-10-CM

## 2018-01-18 NOTE — Therapy (Signed)
[address] [place], [place], [address] Phone: [phone]   Fax:  [phone] Progress Note Reporting Period 12/19/17 to 01/18/18 for visit 21-30  See note below for Objective Data and Assessment of Progress/Goals.      Physical Therapy Treatment  Patient Details  Name: [name] MRN: [id number] Date of Birth: 1932/07/09 Referring Provider (PT): Elsner   Encounter Date: 01/18/2018  PT End of Session - 01/18/18 1518    Visit Number  30    Date for PT Re-Evaluation  01/29/18    PT Start Time  1436    PT Stop Time  1525    PT Time Calculation (min)  49 min    Activity Tolerance  Patient tolerated treatment well    Behavior During Therapy  [facility] for tasks assessed/performed       Past Medical History:  Diagnosis Date  . Arthritis   . GERD (gastroesophageal reflux disease)   . History of blood transfusion   . Hypothyroidism   . PONV (postoperative nausea and vomiting)   . Ptosis of both eyelids   . Vertigo   . Wears glasses    reading    Past Surgical History:  Procedure Laterality Date  . ABDOMINAL HYSTERECTOMY  1986  . APPLICATION OF ROBOTIC ASSISTANCE FOR SPINAL PROCEDURE N/A 07/17/2017   Procedure: APPLICATION OF ROBOTIC ASSISTANCE FOR SPINAL PROCEDURE;  Surgeon: Kristeen Miss, MD;  Location: [place];  Service: Neurosurgery;  Laterality: N/A;  . APPLICATION OF ROBOTIC ASSISTANCE FOR SPINAL PROCEDURE N/A 09/01/2017   Procedure: APPLICATION OF ROBOTIC ASSISTANCE FOR SPINAL PROCEDURE;  Surgeon: Kristeen Miss, MD;  Location: [place];  Service: Neurosurgery;  Laterality: N/A;  . [place]  2000   right  . CARPAL TUNNEL RELEASE  2012   left  . COLONOSCOPY    . CYSTOCELE REPAIR  2007   sling  . DISTAL INTERPHALANGEAL JOINT FUSION Right 02/04/2014   Procedure: FUSION DISTAL INTERPHALANGEAL JOINT RIGHT INDEX FINGER ;  Surgeon: Daryll Brod, MD;   Location: [place];  Service: Orthopedics;  Laterality: Right;  . EYE SURGERY Bilateral    Cataract surgery with lens implant  . KNEE SURGERY  2009,2011   partial knee  . OPEN REDUCTION INTERNAL FIXATION (ORIF) PROXIMAL PHALANX Left 08/30/2013   Procedure: OPEN REDUCTION INTERNAL FIXATION (ORIF) PROXIMAL PHALANX FRACTURE LEFT SMALL FINGER; SPLINT RING FINGER;  Surgeon: Wynonia Sours, MD;  Location: [place] [facility] [place];  Service: Orthopedics;  Laterality: Left;  . POSTERIOR LUMBAR FUSION 4 LEVEL N/A 07/17/2017   Procedure: Thoracic Ten - Lumbar Five revison of hardware with Mazor;  Surgeon: Kristeen Miss, MD;  Location: [facility];  Service: Neurosurgery;  Laterality: N/A;  Thoracic/Lumbar  . PTOSIS REPAIR Bilateral 07/27/2015   Procedure: PTOSIS REPAIR;  Surgeon: Cristine Polio, MD;  Location: [place] [place];  Service: Plastics;  Laterality: Bilateral;  . SHOULDER SURGERY     rt rcr,and lt  . TONSILLECTOMY      There were no vitals filed for this visit.  Subjective Assessment - 01/18/18 1449    Subjective  Patient is feeling better about the right leg and foot, still catches when she is tired    Currently in Pain?  No/denies                       [facility] Adult PT  Treatment/Exercise - 01/18/18 0001      Ambulation/Gait   Gait Comments  gait with SPC and light HHA, outside and around the building 2 rest breaks, she had two time she "scuffed " her toe., the hill really did make her short of breath, very plesaed that she cold do this      Lumbar Exercises: Aerobic   Recumbent Bike  level 1 x 6 minutes      Lumbar Exercises: Machines for Strengthening   Cybex Knee Extension  5# right only 3x10    Cybex Knee Flexion  right only 20# 3x10      Knee/Hip Exercises: Stretches   Gastroc Stretch  5 reps;30 seconds    Soleus Stretch  5 reps;30 seconds      Electrical Stimulation   Electrical Stimulation Location  right anterior tibialis with her in  supine working on [employer]    Clinical biochemist Parameters  4on/12off    Electrical Stimulation Goals  Neuromuscular facilitation               PT Short Term Goals - 10/16/17 1144      PT SHORT TERM GOAL #1   Title  independent with initial HEP    Status  Achieved        PT Long Term Goals - 01/18/18 1521      PT LONG TERM GOAL #1   Title  decrease pain 50%    Status  Achieved      PT LONG TERM GOAL #2   Title  increase Lumbar ROM 25%    Status  Achieved      PT LONG TERM GOAL #3   Title  increase LE strength to 4+/5    Status  Partially Met      PT LONG TERM GOAL #4   Title  go up and down stairs step over step    Status  Partially Met      PT LONG TERM GOAL #5   Title  walk 600 feet without assistive device    Status  Partially Met            Plan - 01/18/18 1519    Clinical Impression Statement  Patient was able to go around the building with SPC and light HHA, she did require two rest breaks and scuffed the toe twice.  She had some hesitation with curbs, she was very short of breath with this    PT Next Visit Plan  continue to work on gait and the use of the right ankle    Consulted and Agree with Plan of Care  Patient       Patient will benefit from skilled therapeutic intervention in order to improve the following deficits and impairments:  Abnormal gait, Cardiopulmonary status limiting activity, Decreased activity tolerance, Decreased balance, Decreased mobility, Decreased strength, Postural dysfunction, Improper body mechanics, Impaired flexibility, Pain, Increased muscle spasms, Decreased range of motion, Difficulty walking  Visit Diagnosis: Muscle weakness (generalized)  Acute bilateral low back pain with bilateral sciatica  Difficulty in walking, not elsewhere classified  Muscle spasm of back     Problem List Patient Active Problem List   Diagnosis Date Noted  . Osteoarthritis of right  knee   . Vertigo   . DDD (degenerative disc disease), cervical   . Benign essential HTN   . History of DVT (deep vein thrombosis)   . Tachycardia   . Steroid-induced hyperglycemia   .  Hypothyroidism   . Neuropathic pain   . Acute blood loss anemia   . S/P lumbar fusion   . Closed L5 vertebral fracture ([place]) 08/31/2017  . Closed fracture of fifth lumbar vertebra ([place]) 08/30/2017  . Lumbar vertebral fracture ([place]) 07/17/2017  . Scoliosis 06/13/2017    Sumner Boast., PT 01/18/2018, 3:22 PM  [address] [place], [place], [address] Phone: [phone]   Fax:  [phone]  Name: [name] MRN: [id number] Date of Birth: 1932/12/03

## 2018-01-23 ENCOUNTER — Encounter: Payer: Self-pay | Admitting: Physical Therapy

## 2018-01-23 ENCOUNTER — Ambulatory Visit: Payer: Medicare Other | Admitting: Physical Therapy

## 2018-01-23 DIAGNOSIS — M6281 Muscle weakness (generalized): Secondary | ICD-10-CM

## 2018-01-23 DIAGNOSIS — M5441 Lumbago with sciatica, right side: Secondary | ICD-10-CM

## 2018-01-23 DIAGNOSIS — M62830 Muscle spasm of back: Secondary | ICD-10-CM

## 2018-01-23 DIAGNOSIS — R262 Difficulty in walking, not elsewhere classified: Secondary | ICD-10-CM

## 2018-01-23 DIAGNOSIS — M5442 Lumbago with sciatica, left side: Secondary | ICD-10-CM

## 2018-01-23 NOTE — Therapy (Signed)
[address] [place], [place], [address] Phone: [phone]   Fax:  [phone]  Physical Therapy Treatment  Patient Details  Name: [name] MRN: [id number] Date of Birth: 08-09-1932 Referring Provider (PT): Elsner   Encounter Date: 01/23/2018  PT End of Session - 01/23/18 1530    Visit Number  31    Date for PT Re-Evaluation  01/29/18    PT Start Time  1430    PT Stop Time  1518    PT Time Calculation (min)  48 min    Activity Tolerance  Patient tolerated treatment well    Behavior During Therapy  [facility] for tasks assessed/performed       Past Medical History:  Diagnosis Date  . Arthritis   . GERD (gastroesophageal reflux disease)   . History of blood transfusion   . Hypothyroidism   . PONV (postoperative nausea and vomiting)   . Ptosis of both eyelids   . Vertigo   . Wears glasses    reading    Past Surgical History:  Procedure Laterality Date  . ABDOMINAL HYSTERECTOMY  1986  . APPLICATION OF ROBOTIC ASSISTANCE FOR SPINAL PROCEDURE N/A 07/17/2017   Procedure: APPLICATION OF ROBOTIC ASSISTANCE FOR SPINAL PROCEDURE;  Surgeon: Kristeen Miss, MD;  Location: [place];  Service: Neurosurgery;  Laterality: N/A;  . APPLICATION OF ROBOTIC ASSISTANCE FOR SPINAL PROCEDURE N/A 09/01/2017   Procedure: APPLICATION OF ROBOTIC ASSISTANCE FOR SPINAL PROCEDURE;  Surgeon: Kristeen Miss, MD;  Location: [place];  Service: Neurosurgery;  Laterality: N/A;  . [place]  2000   right  . CARPAL TUNNEL RELEASE  2012   left  . COLONOSCOPY    . CYSTOCELE REPAIR  2007   sling  . DISTAL INTERPHALANGEAL JOINT FUSION Right 02/04/2014   Procedure: FUSION DISTAL INTERPHALANGEAL JOINT RIGHT INDEX FINGER ;  Surgeon: Daryll Brod, MD;  Location: [facility];  Service: Orthopedics;  Laterality: Right;  . EYE SURGERY Bilateral    Cataract surgery with lens implant  .  KNEE SURGERY  2009,2011   partial knee  . OPEN REDUCTION INTERNAL FIXATION (ORIF) PROXIMAL PHALANX Left 08/30/2013   Procedure: OPEN REDUCTION INTERNAL FIXATION (ORIF) PROXIMAL PHALANX FRACTURE LEFT SMALL FINGER; SPLINT RING FINGER;  Surgeon: Wynonia Sours, MD;  Location: [facility];  Service: Orthopedics;  Laterality: Left;  . POSTERIOR LUMBAR FUSION 4 LEVEL N/A 07/17/2017   Procedure: Thoracic Ten - Lumbar Five revison of hardware with Mazor;  Surgeon: Kristeen Miss, MD;  Location: [facility];  Service: Neurosurgery;  Laterality: N/A;  Thoracic/Lumbar  . PTOSIS REPAIR Bilateral 07/27/2015   Procedure: PTOSIS REPAIR;  Surgeon: Cristine Polio, MD;  Location: [facility];  Service: Plastics;  Laterality: Bilateral;  . SHOULDER SURGERY     rt rcr,and lt  . TONSILLECTOMY      There were no vitals filed for this visit.  Subjective Assessment - 01/23/18 1436    Subjective  Patient reports that she is having some pain in the buttocks, "I might be walking too much"    Currently in Pain?  Yes    Pain Score  4     Pain Location  Buttocks    Pain Orientation  Right;Left    Pain Descriptors / Indicators  Sore    Aggravating Factors   walking  [facility] Adult PT Treatment/Exercise - 01/23/18 0001      Ambulation/Gait   Gait Comments  gait with SPC, light HHA 180' x 2      High Level Balance   High Level Balance Activities  Side stepping;Backward walking;Sudden stops;Tandem walking;Marching forwards;Negotiating over obstacles;Negotitating around obstacles    High Level Balance Comments  side step over obstacles      Lumbar Exercises: Machines for Strengthening   Cybex Knee Extension  5# right only 3x10    Cybex Knee Flexion  right only 20# 3x10    Leg Press  30# 3x10      Knee/Hip Exercises: Stretches   Gastroc Stretch  5 reps;30 seconds      Electrical Stimulation   Electrical Stimulation Location  right anterior tibialis with her  in supine working on [employer]    Clinical biochemist Parameters  4 on 12 off    Electrical Stimulation Goals  Neuromuscular facilitation               PT Short Term Goals - 10/16/17 1144      PT SHORT TERM GOAL #1   Title  independent with initial HEP    Status  Achieved        PT Long Term Goals - 01/18/18 1521      PT LONG TERM GOAL #1   Title  decrease pain 50%    Status  Achieved      PT LONG TERM GOAL #2   Title  increase Lumbar ROM 25%    Status  Achieved      PT LONG TERM GOAL #3   Title  increase LE strength to 4+/5    Status  Partially Met      PT LONG TERM GOAL #4   Title  go up and down stairs step over step    Status  Partially Met      PT LONG TERM GOAL #5   Title  walk 600 feet without assistive device    Status  Partially Met            Plan - 01/23/18 1530    Clinical Impression Statement  Patient able to walk without much HHA using the [facility], she did require HHA with stepping over obstacles, she is moving the foot much better, had her practice gas and brake    PT Next Visit Plan  work on balance and anterior tib    Consulted and Agree with Plan of Care  Patient       Patient will benefit from skilled therapeutic intervention in order to improve the following deficits and impairments:  Abnormal gait, Cardiopulmonary status limiting activity, Decreased activity tolerance, Decreased balance, Decreased mobility, Decreased strength, Postural dysfunction, Improper body mechanics, Impaired flexibility, Pain, Increased muscle spasms, Decreased range of motion, Difficulty walking  Visit Diagnosis: Muscle weakness (generalized)  Acute bilateral low back pain with bilateral sciatica  Difficulty in walking, not elsewhere classified  Muscle spasm of back     Problem List Patient Active Problem List   Diagnosis Date Noted  . Osteoarthritis of right knee   . Vertigo   . DDD (degenerative disc  disease), cervical   . Benign essential HTN   . History of DVT (deep vein thrombosis)   . Tachycardia   . Steroid-induced hyperglycemia   . Hypothyroidism   . Neuropathic pain   . Acute blood loss anemia   . S/P lumbar fusion   .  Closed L5 vertebral fracture ([place]) 08/31/2017  . Closed fracture of fifth lumbar vertebra ([place]) 08/30/2017  . Lumbar vertebral fracture ([place]) 07/17/2017  . Scoliosis 06/13/2017    Sumner Boast., PT 01/23/2018, 3:32 PM  [address] [place], [place], [address] Phone: [phone]   Fax:  [phone]  Name: [name] MRN: [id number] Date of Birth: Nov 08, 1932

## 2018-01-25 ENCOUNTER — Ambulatory Visit: Payer: Medicare Other | Admitting: Physical Therapy

## 2018-01-25 ENCOUNTER — Encounter: Payer: Self-pay | Admitting: Physical Therapy

## 2018-01-25 DIAGNOSIS — M6281 Muscle weakness (generalized): Secondary | ICD-10-CM

## 2018-01-25 DIAGNOSIS — M5442 Lumbago with sciatica, left side: Secondary | ICD-10-CM

## 2018-01-25 DIAGNOSIS — R262 Difficulty in walking, not elsewhere classified: Secondary | ICD-10-CM

## 2018-01-25 DIAGNOSIS — M62830 Muscle spasm of back: Secondary | ICD-10-CM

## 2018-01-25 DIAGNOSIS — M5441 Lumbago with sciatica, right side: Secondary | ICD-10-CM

## 2018-01-25 NOTE — Therapy (Signed)
[address] [place], [place], [address] Phone: [phone]   Fax:  [phone]  Physical Therapy Treatment  Patient Details  Name: [name] MRN: [id number] Date of Birth: 1933/03/09 Referring Provider (PT): Elsner   Encounter Date: 01/25/2018  PT End of Session - 01/25/18 1524    Visit Number  32    Date for PT Re-Evaluation  01/29/18    PT Start Time  1435    PT Stop Time  1531    PT Time Calculation (min)  56 min    Activity Tolerance  Patient tolerated treatment well    Behavior During Therapy  [name] for tasks assessed/performed       Past Medical History:  Diagnosis Date  . Arthritis   . GERD (gastroesophageal reflux disease)   . History of blood transfusion   . Hypothyroidism   . PONV (postoperative nausea and vomiting)   . Ptosis of both eyelids   . Vertigo   . Wears glasses    reading    Past Surgical History:  Procedure Laterality Date  . ABDOMINAL HYSTERECTOMY  1986  . APPLICATION OF ROBOTIC ASSISTANCE FOR SPINAL PROCEDURE N/A 07/17/2017   Procedure: APPLICATION OF ROBOTIC ASSISTANCE FOR SPINAL PROCEDURE;  Surgeon: Kristeen Miss, MD;  Location: [facility];  Service: Neurosurgery;  Laterality: N/A;  . APPLICATION OF ROBOTIC ASSISTANCE FOR SPINAL PROCEDURE N/A 09/01/2017   Procedure: APPLICATION OF ROBOTIC ASSISTANCE FOR SPINAL PROCEDURE;  Surgeon: Kristeen Miss, MD;  Location: [facility];  Service: Neurosurgery;  Laterality: N/A;  . [place]  2000   right  . CARPAL TUNNEL RELEASE  2012   left  . COLONOSCOPY    . CYSTOCELE REPAIR  2007   sling  . DISTAL INTERPHALANGEAL JOINT FUSION Right 02/04/2014   Procedure: FUSION DISTAL INTERPHALANGEAL JOINT RIGHT INDEX FINGER ;  Surgeon: Daryll Brod, MD;  Location: [facility];  Service: Orthopedics;  Laterality: Right;  . EYE SURGERY Bilateral    Cataract surgery with lens implant  .  KNEE SURGERY  2009,2011   partial knee  . OPEN REDUCTION INTERNAL FIXATION (ORIF) PROXIMAL PHALANX Left 08/30/2013   Procedure: OPEN REDUCTION INTERNAL FIXATION (ORIF) PROXIMAL PHALANX FRACTURE LEFT SMALL FINGER; SPLINT RING FINGER;  Surgeon: Wynonia Sours, MD;  Location: [facility];  Service: Orthopedics;  Laterality: Left;  . POSTERIOR LUMBAR FUSION 4 LEVEL N/A 07/17/2017   Procedure: Thoracic Ten - Lumbar Five revison of hardware with Mazor;  Surgeon: Kristeen Miss, MD;  Location: [facility];  Service: Neurosurgery;  Laterality: N/A;  Thoracic/Lumbar  . PTOSIS REPAIR Bilateral 07/27/2015   Procedure: PTOSIS REPAIR;  Surgeon: Cristine Polio, MD;  Location: [facility];  Service: Plastics;  Laterality: Bilateral;  . SHOULDER SURGERY     rt rcr,and lt  . TONSILLECTOMY      There were no vitals filed for this visit.  Subjective Assessment - 01/25/18 1510    Subjective  Patient reports that it seems that the pain in the back got better, she reports it could have been that she over did it, asks about using a cane    Currently in Pain?  Yes    Pain Score  3     Pain Location  Buttocks    Pain Orientation  Right;Left    Pain Descriptors / Indicators  Sore  [facility] Adult PT Treatment/Exercise - 01/25/18 0001      Ambulation/Gait   Gait Comments  SPC and light HHA around the building outside, one rest, had some scuffing of the right toe on flat surfaces at mid point, then rest and did well, became SOB at the end of the walk      High Level Balance   High Level Balance Activities  Side stepping;Negotiating over obstacles    High Level Balance Comments  side step over obstacles      Lumbar Exercises: Aerobic   Recumbent Bike  level 2 x 6 minutes      Lumbar Exercises: Machines for Strengthening   Cybex Knee Extension  5# right only 3x10    Cybex Knee Flexion  right only 20# 3x10    Leg Press  40# 3x10      Electrical Stimulation    Electrical Stimulation Location  right anterior tibialis with her in supine working on [employer]    Clinical biochemist Parameters  4 on and 12 off    Electrical Stimulation Goals  Neuromuscular facilitation               PT Short Term Goals - 10/16/17 1144      PT SHORT TERM GOAL #1   Title  independent with initial HEP    Status  Achieved        PT Long Term Goals - 01/25/18 1528      PT LONG TERM GOAL #4   Title  go up and down stairs step over step    Status  Partially Met      PT LONG TERM GOAL #5   Title  walk 600 feet without assistive device    Status  Partially Met            Plan - 01/25/18 1524    Clinical Impression Statement  Patient with increased ability and increased strength and function, she scuffed her toe as she got tired on the flat walking.  Coming up the hill she did well with the foot and the toe but was SOB.  She is having less postureal issues as she walks and is relying less on the assistive devices, at this time I do not think she can be fully away from the walker even in the home but she is close    PT Next Visit Plan  continue with gait with SPC    Consulted and Agree with Plan of Care  Patient       Patient will benefit from skilled therapeutic intervention in order to improve the following deficits and impairments:  Abnormal gait, Cardiopulmonary status limiting activity, Decreased activity tolerance, Decreased balance, Decreased mobility, Decreased strength, Postural dysfunction, Improper body mechanics, Impaired flexibility, Pain, Increased muscle spasms, Decreased range of motion, Difficulty walking  Visit Diagnosis: Muscle weakness (generalized)  Acute bilateral low back pain with bilateral sciatica  Difficulty in walking, not elsewhere classified  Muscle spasm of back     Problem List Patient Active Problem List   Diagnosis Date Noted  . Osteoarthritis of right knee   .  Vertigo   . DDD (degenerative disc disease), cervical   . Benign essential HTN   . History of DVT (deep vein thrombosis)   . Tachycardia   . Steroid-induced hyperglycemia   . Hypothyroidism   . Neuropathic pain   . Acute blood loss anemia   . S/P lumbar fusion   .  Closed L5 vertebral fracture ([place]) 08/31/2017  . Closed fracture of fifth lumbar vertebra ([place]) 08/30/2017  . Lumbar vertebral fracture ([place]) 07/17/2017  . Scoliosis 06/13/2017    Sumner Boast., PT 01/25/2018, 3:29 PM  [address] [place], [place], [address] Phone: [phone]   Fax:  [phone]  Name: [name] MRN: [id number] Date of Birth: 05-09-1932

## 2018-01-30 ENCOUNTER — Ambulatory Visit: Payer: Medicare Other | Admitting: Physical Therapy

## 2018-01-30 ENCOUNTER — Encounter: Payer: Self-pay | Admitting: Physical Therapy

## 2018-01-30 DIAGNOSIS — R262 Difficulty in walking, not elsewhere classified: Secondary | ICD-10-CM

## 2018-01-30 DIAGNOSIS — M5441 Lumbago with sciatica, right side: Secondary | ICD-10-CM

## 2018-01-30 DIAGNOSIS — M6281 Muscle weakness (generalized): Secondary | ICD-10-CM

## 2018-01-30 DIAGNOSIS — M62830 Muscle spasm of back: Secondary | ICD-10-CM

## 2018-01-30 DIAGNOSIS — M5442 Lumbago with sciatica, left side: Secondary | ICD-10-CM

## 2018-01-30 NOTE — Addendum Note (Signed)
Addended by: Sumner Boast on: 01/30/2018 03:17 PM   Modules accepted: Orders

## 2018-01-30 NOTE — Therapy (Signed)
[place], [place], [address] Phone: [phone]   Fax:  [phone]  Physical Therapy Treatment  Patient Details  Name: [name] MRN: [id number] Date of Birth: [birth year] Referring Provider (PT): Elsner   Encounter Date: 01/30/2018  PT End of Session - 01/30/18 1506    Visit Number  33    Date for PT Re-Evaluation  03/02/18    PT Start Time  1430    PT Stop Time  1518    PT Time Calculation (min)  48 min    Activity Tolerance  Patient tolerated treatment well    Behavior During Therapy  [facility] for tasks assessed/performed       Past Medical History:  Diagnosis Date  . Arthritis   . GERD (gastroesophageal reflux disease)   . History of blood transfusion   . Hypothyroidism   . PONV (postoperative nausea and vomiting)   . Ptosis of both eyelids   . Vertigo   . Wears glasses    reading    Past Surgical History:  Procedure Laterality Date  . ABDOMINAL HYSTERECTOMY  1986  . APPLICATION OF ROBOTIC ASSISTANCE FOR SPINAL PROCEDURE N/A 07/17/2017   Procedure: APPLICATION OF ROBOTIC ASSISTANCE FOR SPINAL PROCEDURE;  Surgeon: Kristeen Miss, MD;  Location: [place];  Service: Neurosurgery;  Laterality: N/A;  . APPLICATION OF ROBOTIC ASSISTANCE FOR SPINAL PROCEDURE N/A 09/01/2017   Procedure: APPLICATION OF ROBOTIC ASSISTANCE FOR SPINAL PROCEDURE;  Surgeon: Kristeen Miss, MD;  Location: [place];  Service: Neurosurgery;  Laterality: N/A;  . [place]  2000   right  . CARPAL TUNNEL RELEASE  2012   left  . COLONOSCOPY    . CYSTOCELE REPAIR  2007   sling  . DISTAL INTERPHALANGEAL JOINT FUSION Right 02/04/2014   Procedure: FUSION DISTAL INTERPHALANGEAL JOINT RIGHT INDEX FINGER ;  Surgeon: Daryll Brod, MD;  Location: [place];  Service: Orthopedics;  Laterality: Right;  . EYE SURGERY Bilateral    Cataract surgery with lens implant  .  KNEE SURGERY  2009,2011   partial knee  . OPEN REDUCTION INTERNAL FIXATION (ORIF) PROXIMAL PHALANX Left 08/30/2013   Procedure: OPEN REDUCTION INTERNAL FIXATION (ORIF) PROXIMAL PHALANX FRACTURE LEFT SMALL FINGER; SPLINT RING FINGER;  Surgeon: Wynonia Sours, MD;  Location: [place];  Service: Orthopedics;  Laterality: Left;  . POSTERIOR LUMBAR FUSION 4 LEVEL N/A 07/17/2017   Procedure: Thoracic Ten - Lumbar Five revison of hardware with Mazor;  Surgeon: Kristeen Miss, MD;  Location: [place];  Service: Neurosurgery;  Laterality: N/A;  Thoracic/Lumbar  . PTOSIS REPAIR Bilateral 07/27/2015   Procedure: PTOSIS REPAIR;  Surgeon: Cristine Polio, MD;  Location: [place];  Service: Plastics;  Laterality: Bilateral;  . SHOULDER SURGERY     rt rcr,and lt  . TONSILLECTOMY      There were no vitals filed for this visit.  Subjective Assessment - 01/30/18 1440    Subjective  Patient reports that she is walking in the home without a device but uses the furniture and walls.  Some back pain yesterday    Currently in Pain?  Yes    Pain Score  2     Pain Location  Buttocks    Pain Orientation  Right  [place] Adult PT Treatment/Exercise - 01/30/18 0001      Ambulation/Gait   Gait Comments  SPc negotiating obstacles then to the front door of building and back      High Level Balance   High Level Balance Activities  Side stepping;Negotiating over obstacles      Exercises   Exercises  Lumbar      Lumbar Exercises: Aerobic   Recumbent Bike  level 2 x 6 minutes      Lumbar Exercises: Machines for Strengthening   Cybex Knee Extension  , some eccentrics as she fatigued    Cybex Knee Flexion  right only 20# 3x10, cues needed to get better flexion      Lumbar Exercises: Standing   Other Standing Lumbar Exercises  hip flexion, abduction and extension 3# bilaterally 2x10      Electrical Stimulation   Electrical Stimulation Location  right  anterior tibialis with her in supine working on [employer]    Clinical biochemist Parameters  4 on 12 off    Electrical Stimulation Goals  Neuromuscular facilitation               PT Short Term Goals - 10/16/17 1144      PT SHORT TERM GOAL #1   Title  independent with initial HEP    Status  Achieved        PT Long Term Goals - 01/25/18 1528      PT LONG TERM GOAL #4   Title  go up and down stairs step over step    Status  Partially Met      PT LONG TERM GOAL #5   Title  walk 600 feet without assistive device    Status  Partially Met            Plan - 01/30/18 1507    Clinical Impression Statement  Patient had increased right foot flop as she fatigued, with some cues she was able to stop this.  Again she is improving with herability to hold posture while walking with a SPC and is more stable over all    PT Next Visit Plan  work on gait and stability    Consulted and Agree with Plan of Care  Patient       Patient will benefit from skilled therapeutic intervention in order to improve the following deficits and impairments:  Abnormal gait, Cardiopulmonary status limiting activity, Decreased activity tolerance, Decreased balance, Decreased mobility, Decreased strength, Postural dysfunction, Improper body mechanics, Impaired flexibility, Pain, Increased muscle spasms, Decreased range of motion, Difficulty walking  Visit Diagnosis: Muscle weakness (generalized)  Acute bilateral low back pain with bilateral sciatica  Difficulty in walking, not elsewhere classified  Muscle spasm of back     Problem List Patient Active Problem List   Diagnosis Date Noted  . Osteoarthritis of right knee   . Vertigo   . DDD (degenerative disc disease), cervical   . Benign essential HTN   . History of DVT (deep vein thrombosis)   . Tachycardia   . Steroid-induced hyperglycemia   . Hypothyroidism   . Neuropathic pain   . Acute blood loss  anemia   . S/P lumbar fusion   . Closed L5 vertebral fracture ([place]) 08/31/2017  . Closed fracture of fifth lumbar vertebra ([place]) 08/30/2017  . Lumbar vertebral fracture ([place]) 07/17/2017  . Scoliosis 06/13/2017    Sumner Boast., PT 01/30/2018, 3:10 PM  [place]  [place], [place], [address] Phone: [phone]   Fax:  [phone]  Name: [name] MRN: [id number] Date of Birth: Aug 15, 1932

## 2018-02-01 ENCOUNTER — Encounter: Payer: Self-pay | Admitting: Physical Therapy

## 2018-02-01 ENCOUNTER — Ambulatory Visit: Payer: Medicare Other | Admitting: Physical Therapy

## 2018-02-01 DIAGNOSIS — M5442 Lumbago with sciatica, left side: Secondary | ICD-10-CM

## 2018-02-01 DIAGNOSIS — R262 Difficulty in walking, not elsewhere classified: Secondary | ICD-10-CM

## 2018-02-01 DIAGNOSIS — M62830 Muscle spasm of back: Secondary | ICD-10-CM

## 2018-02-01 DIAGNOSIS — M6281 Muscle weakness (generalized): Secondary | ICD-10-CM | POA: Diagnosis not present

## 2018-02-01 DIAGNOSIS — M5441 Lumbago with sciatica, right side: Secondary | ICD-10-CM

## 2018-02-01 NOTE — Therapy (Signed)
[place], [place], [address] Phone: [phone]   Fax:  [phone]  Physical Therapy Treatment  Patient Details  Name: [name] MRN: [id number] Date of Birth: 1932/09/24 Referring Provider (PT): Elsner   Encounter Date: 02/01/2018  PT End of Session - 02/01/18 0910    Visit Number  34    Date for PT Re-Evaluation  03/02/18    PT Start Time  0832    PT Stop Time  0925    PT Time Calculation (min)  53 min    Activity Tolerance  Patient tolerated treatment well    Behavior During Therapy  [facility] for tasks assessed/performed       Past Medical History:  Diagnosis Date  . Arthritis   . GERD (gastroesophageal reflux disease)   . History of blood transfusion   . Hypothyroidism   . PONV (postoperative nausea and vomiting)   . Ptosis of both eyelids   . Vertigo   . Wears glasses    reading    Past Surgical History:  Procedure Laterality Date  . ABDOMINAL HYSTERECTOMY  1986  . APPLICATION OF ROBOTIC ASSISTANCE FOR SPINAL PROCEDURE N/A 07/17/2017   Procedure: APPLICATION OF ROBOTIC ASSISTANCE FOR SPINAL PROCEDURE;  Surgeon: Kristeen Miss, MD;  Location: [place];  Service: Neurosurgery;  Laterality: N/A;  . APPLICATION OF ROBOTIC ASSISTANCE FOR SPINAL PROCEDURE N/A 09/01/2017   Procedure: APPLICATION OF ROBOTIC ASSISTANCE FOR SPINAL PROCEDURE;  Surgeon: Kristeen Miss, MD;  Location: [place];  Service: Neurosurgery;  Laterality: N/A;  . [place]  2000   right  . CARPAL TUNNEL RELEASE  2012   left  . COLONOSCOPY    . CYSTOCELE REPAIR  2007   sling  . DISTAL INTERPHALANGEAL JOINT FUSION Right 02/04/2014   Procedure: FUSION DISTAL INTERPHALANGEAL JOINT RIGHT INDEX FINGER ;  Surgeon: Daryll Brod, MD;  Location: [place];  Service: Orthopedics;  Laterality: Right;  . EYE SURGERY Bilateral    Cataract surgery with lens implant  .  KNEE SURGERY  2009,2011   partial knee  . OPEN REDUCTION INTERNAL FIXATION (ORIF) PROXIMAL PHALANX Left 08/30/2013   Procedure: OPEN REDUCTION INTERNAL FIXATION (ORIF) PROXIMAL PHALANX FRACTURE LEFT SMALL FINGER; SPLINT RING FINGER;  Surgeon: Wynonia Sours, MD;  Location: [place];  Service: Orthopedics;  Laterality: Left;  . POSTERIOR LUMBAR FUSION 4 LEVEL N/A 07/17/2017   Procedure: Thoracic Ten - Lumbar Five revison of hardware with Mazor;  Surgeon: Kristeen Miss, MD;  Location: [place];  Service: Neurosurgery;  Laterality: N/A;  Thoracic/Lumbar  . PTOSIS REPAIR Bilateral 07/27/2015   Procedure: PTOSIS REPAIR;  Surgeon: Cristine Polio, MD;  Location: [place];  Service: Plastics;  Laterality: Bilateral;  . SHOULDER SURGERY     rt rcr,and lt  . TONSILLECTOMY      There were no vitals filed for this visit.  Subjective Assessment - 02/01/18 0832    Subjective  Reports that the last day or so she is feeling a little better still some pain at times in the low back    Currently in Pain?  Yes    Pain Score  1     Pain Location  Buttocks    Pain Orientation  Right  [place] Adult PT Treatment/Exercise - 02/01/18 0001      Ambulation/Gait   Gait Comments  SPC walking 600 feet without rest a little more unsteady today but did well with the foot, caught the toe one time      High Level Balance   High Level Balance Activities  Side stepping;Negotiating over obstacles      Lumbar Exercises: Aerobic   Recumbent Bike  level 2 x 6 minutes      Lumbar Exercises: Machines for Strengthening   Cybex Knee Extension  5# mostly working eccenttrics    Cybex Knee Flexion  right only 20# 3x10, cues needed to get better flexion    Other Lumbar Machine Exercise  seated row and lats 35# 3x10 each      Lumbar Exercises: Standing   Other Standing Lumbar Exercises  hip flexion, abduction and extension 3# bilaterally 2x10      Electrical  Stimulation   Electrical Stimulation Location  right anterior tibialis with her in supine working on [employer]    Engineer, civil (consulting) Goals  Neuromuscular facilitation               PT Short Term Goals - 10/16/17 1144      PT SHORT TERM GOAL #1   Title  independent with initial HEP    Status  Achieved        PT Long Term Goals - 02/01/18 0914      PT LONG TERM GOAL #1   Title  decrease pain 50%    Status  Achieved      PT LONG TERM GOAL #2   Title  increase Lumbar ROM 25%    Status  Achieved      PT LONG TERM GOAL #3   Title  increase LE strength to 4+/5    Status  Partially Met      PT LONG TERM GOAL #4   Title  go up and down stairs step over step    Status  Partially Met      PT LONG TERM GOAL #5   Title  walk 600 feet without assistive device    Status  Partially Met            Plan - 02/01/18 0911    Clinical Impression Statement  Patient was a little more unsteady iwth walking with a SPC today, however she tolerated increased distance with the can and less right foot drop, she only caught the foot slight 1x in 600+ feet .  She is doing well with less back pain, it seems the right ankle and foot are having less swelling.  There are times when she still has some right low back/buttock pain    PT Next Visit Plan  Continue to work on walking and function, still stimulate the anterior tibialis    Consulted and Agree with Plan of Care  Patient       Patient will benefit from skilled therapeutic intervention in order to improve the following deficits and impairments:  Abnormal gait, Cardiopulmonary status limiting activity, Decreased activity tolerance, Decreased balance, Decreased mobility, Decreased strength, Postural dysfunction, Improper body mechanics, Impaired flexibility, Pain, Increased muscle spasms, Decreased range of motion, Difficulty walking  Visit  Diagnosis: Muscle weakness (generalized)  Acute bilateral low back pain with bilateral sciatica  Difficulty in walking, not elsewhere classified  Muscle spasm of back     Problem  List Patient Active Problem List   Diagnosis Date Noted  . Osteoarthritis of right knee   . Vertigo   . DDD (degenerative disc disease), cervical   . Benign essential HTN   . History of DVT (deep vein thrombosis)   . Tachycardia   . Steroid-induced hyperglycemia   . Hypothyroidism   . Neuropathic pain   . Acute blood loss anemia   . S/P lumbar fusion   . Closed L5 vertebral fracture ([place]) 08/31/2017  . Closed fracture of fifth lumbar vertebra ([place]) 08/30/2017  . Lumbar vertebral fracture ([place]) 07/17/2017  . Scoliosis 06/13/2017    Sumner Boast., PT 02/01/2018, 9:16 AM  [place] [address] [place], [place], [address] Phone: [phone]   Fax:  [phone]  Name: [name] MRN: [id number] Date of Birth: [birth year]

## 2018-02-06 ENCOUNTER — Encounter: Payer: Self-pay | Admitting: Physical Therapy

## 2018-02-06 ENCOUNTER — Ambulatory Visit: Payer: Medicare Other | Attending: Internal Medicine | Admitting: Physical Therapy

## 2018-02-06 DIAGNOSIS — M5441 Lumbago with sciatica, right side: Secondary | ICD-10-CM | POA: Insufficient documentation

## 2018-02-06 DIAGNOSIS — R262 Difficulty in walking, not elsewhere classified: Secondary | ICD-10-CM | POA: Insufficient documentation

## 2018-02-06 DIAGNOSIS — M5442 Lumbago with sciatica, left side: Secondary | ICD-10-CM | POA: Insufficient documentation

## 2018-02-06 DIAGNOSIS — M6281 Muscle weakness (generalized): Secondary | ICD-10-CM | POA: Insufficient documentation

## 2018-02-06 DIAGNOSIS — M62830 Muscle spasm of back: Secondary | ICD-10-CM | POA: Diagnosis present

## 2018-02-06 DIAGNOSIS — M25561 Pain in right knee: Secondary | ICD-10-CM | POA: Insufficient documentation

## 2018-02-06 NOTE — Therapy (Signed)
[place] [address] [place], [place], [address] Phone: [phone]   Fax:  [phone]  Physical Therapy Treatment  Patient Details  Name: [name] MRN: [id number] Date of Birth: [birth year] Referring Provider (PT): Elsner   Encounter Date: 02/06/2018  PT End of Session - 02/06/18 1438    Visit Number  35    Date for PT Re-Evaluation  03/02/18    PT Start Time  1345    PT Stop Time  1430    PT Time Calculation (min)  45 min    Activity Tolerance  Patient tolerated treatment well    Behavior During Therapy  [facility] for tasks assessed/performed       Past Medical History:  Diagnosis Date  . Arthritis   . GERD (gastroesophageal reflux disease)   . History of blood transfusion   . Hypothyroidism   . PONV (postoperative nausea and vomiting)   . Ptosis of both eyelids   . Vertigo   . Wears glasses    reading    Past Surgical History:  Procedure Laterality Date  . ABDOMINAL HYSTERECTOMY  1986  . APPLICATION OF ROBOTIC ASSISTANCE FOR SPINAL PROCEDURE N/A 07/17/2017   Procedure: APPLICATION OF ROBOTIC ASSISTANCE FOR SPINAL PROCEDURE;  Surgeon: Kristeen Miss, MD;  Location: [place];  Service: Neurosurgery;  Laterality: N/A;  . APPLICATION OF ROBOTIC ASSISTANCE FOR SPINAL PROCEDURE N/A 09/01/2017   Procedure: APPLICATION OF ROBOTIC ASSISTANCE FOR SPINAL PROCEDURE;  Surgeon: Kristeen Miss, MD;  Location: [place];  Service: Neurosurgery;  Laterality: N/A;  . [place]  2000   right  . CARPAL TUNNEL RELEASE  2012   left  . COLONOSCOPY    . CYSTOCELE REPAIR  2007   sling  . DISTAL INTERPHALANGEAL JOINT FUSION Right 02/04/2014   Procedure: FUSION DISTAL INTERPHALANGEAL JOINT RIGHT INDEX FINGER ;  Surgeon: Daryll Brod, MD;  Location: [place];  Service: Orthopedics;  Laterality: Right;  . EYE SURGERY Bilateral    Cataract surgery with lens implant  .  KNEE SURGERY  2009,2011   partial knee  . OPEN REDUCTION INTERNAL FIXATION (ORIF) PROXIMAL PHALANX Left 08/30/2013   Procedure: OPEN REDUCTION INTERNAL FIXATION (ORIF) PROXIMAL PHALANX FRACTURE LEFT SMALL FINGER; SPLINT RING FINGER;  Surgeon: Wynonia Sours, MD;  Location: [place];  Service: Orthopedics;  Laterality: Left;  . POSTERIOR LUMBAR FUSION 4 LEVEL N/A 07/17/2017   Procedure: Thoracic Ten - Lumbar Five revison of hardware with Mazor;  Surgeon: Kristeen Miss, MD;  Location: [place];  Service: Neurosurgery;  Laterality: N/A;  Thoracic/Lumbar  . PTOSIS REPAIR Bilateral 07/27/2015   Procedure: PTOSIS REPAIR;  Surgeon: Cristine Polio, MD;  Location: [place];  Service: Plastics;  Laterality: Bilateral;  . SHOULDER SURGERY     rt rcr,and lt  . TONSILLECTOMY      There were no vitals filed for this visit.  Subjective Assessment - 02/06/18 1428    Subjective  Patient saw the neurosurgeon last week, he was pleased and really wants her to continue PT, he reported that she is making progress, slowly but progressing well    Currently in Pain?  No/denies                       [facility] Adult PT Treatment/Exercise - 02/06/18 0001      Ambulation/Gait   Gait Comments  SPC around the building with 1 rest break, good spead, did not fatigue until going up hill, also had 1 scuff of the toe coming up when she was tired.      Lumbar Exercises: Aerobic   Recumbent Bike  level 2 x 6 minutes      Lumbar Exercises: Machines for Strengthening   Cybex Knee Extension  5# mostly working eccenttrics    Cybex Knee Flexion  right only 20# 3x10, cues needed to get better flexion    Leg Press  40# 3x10    Other Lumbar Machine Exercise  seated row and lats 35# 3x10 each      Lumbar Exercises: Supine   Other Supine Lumbar Exercises  PF with green tband, DF with yellow tand assist               PT Short Term Goals - 10/16/17 1144      PT SHORT TERM GOAL  #1   Title  independent with initial HEP    Status  Achieved        PT Long Term Goals - 02/01/18 0914      PT LONG TERM GOAL #1   Title  decrease pain 50%    Status  Achieved      PT LONG TERM GOAL #2   Title  increase Lumbar ROM 25%    Status  Achieved      PT LONG TERM GOAL #3   Title  increase LE strength to 4+/5    Status  Partially Met      PT LONG TERM GOAL #4   Title  go up and down stairs step over step    Status  Partially Met      PT LONG TERM GOAL #5   Title  walk 600 feet without assistive device    Status  Partially Met            Plan - 02/06/18 1438    Clinical Impression Statement  Patient saw the neurosurgeon, he is pleased with her progress and feels that she should continue, we are pushing her function and she is doing well, at this time I do not feel she is fully ready to be away from the walker due to the right foot catching when she is tired    PT Next Visit Plan  Continue to work on walking and function, still stimulate the anterior tibialis    Consulted and Agree with Plan of Care  Patient       Patient will benefit from skilled therapeutic intervention in order to improve the following deficits and impairments:  Abnormal gait, Cardiopulmonary status limiting activity, Decreased activity tolerance, Decreased balance, Decreased mobility, Decreased strength, Postural dysfunction, Improper body mechanics, Impaired flexibility, Pain, Increased muscle spasms, Decreased range of motion, Difficulty walking  Visit Diagnosis: Muscle weakness (generalized)  Acute bilateral low back pain with bilateral sciatica  Difficulty in walking, not elsewhere classified  Muscle spasm of back     Problem List Patient Active Problem List   Diagnosis Date Noted  . Osteoarthritis of right knee   . Vertigo   . DDD (degenerative disc disease), cervical   . Benign essential HTN   . History of DVT (deep vein thrombosis)   . Tachycardia   . Steroid-induced  hyperglycemia   . Hypothyroidism   . Neuropathic pain   . Acute blood loss anemia   . S/P lumbar fusion   . Closed L5 vertebral fracture ([place]) 08/31/2017  .  Closed fracture of fifth lumbar vertebra ([place]) 08/30/2017  . Lumbar vertebral fracture ([place]) 07/17/2017  . Scoliosis 06/13/2017    Sumner Boast., PT 02/06/2018, 2:39 PM  [place] [address] [place], [place], [address] Phone: [phone]   Fax:  [phone]  Name: ENIYAH EASTMOND MRN: [id number] Date of Birth: [birth year]

## 2018-02-08 ENCOUNTER — Encounter: Payer: Self-pay | Admitting: Physical Therapy

## 2018-02-08 ENCOUNTER — Ambulatory Visit: Payer: Medicare Other | Admitting: Physical Therapy

## 2018-02-08 DIAGNOSIS — R262 Difficulty in walking, not elsewhere classified: Secondary | ICD-10-CM

## 2018-02-08 DIAGNOSIS — M5442 Lumbago with sciatica, left side: Secondary | ICD-10-CM

## 2018-02-08 DIAGNOSIS — M6281 Muscle weakness (generalized): Secondary | ICD-10-CM | POA: Diagnosis not present

## 2018-02-08 DIAGNOSIS — M5441 Lumbago with sciatica, right side: Secondary | ICD-10-CM

## 2018-02-08 NOTE — Therapy (Signed)
[place] [address] [place], [place], [address] Phone: [phone]   Fax:  [phone]  Physical Therapy Treatment  Patient Details  Name: [name] MRN: [id number] Date of Birth: February 14, 1933 Referring Provider (PT): Elsner   Encounter Date: 02/08/2018  PT End of Session - 02/08/18 1548    Visit Number  36    Date for PT Re-Evaluation  03/02/18    PT Start Time  1350    PT Stop Time  1440    PT Time Calculation (min)  50 min    Activity Tolerance  Patient tolerated treatment well    Behavior During Therapy  [facility] for tasks assessed/performed       Past Medical History:  Diagnosis Date  . Arthritis   . GERD (gastroesophageal reflux disease)   . History of blood transfusion   . Hypothyroidism   . PONV (postoperative nausea and vomiting)   . Ptosis of both eyelids   . Vertigo   . Wears glasses    reading    Past Surgical History:  Procedure Laterality Date  . ABDOMINAL HYSTERECTOMY  1986  . APPLICATION OF ROBOTIC ASSISTANCE FOR SPINAL PROCEDURE N/A 07/17/2017   Procedure: APPLICATION OF ROBOTIC ASSISTANCE FOR SPINAL PROCEDURE;  Surgeon: Kristeen Miss, MD;  Location: [place];  Service: Neurosurgery;  Laterality: N/A;  . APPLICATION OF ROBOTIC ASSISTANCE FOR SPINAL PROCEDURE N/A 09/01/2017   Procedure: APPLICATION OF ROBOTIC ASSISTANCE FOR SPINAL PROCEDURE;  Surgeon: Kristeen Miss, MD;  Location: [place];  Service: Neurosurgery;  Laterality: N/A;  . [place]  2000   right  . CARPAL TUNNEL RELEASE  2012   left  . COLONOSCOPY    . CYSTOCELE REPAIR  2007   sling  . DISTAL INTERPHALANGEAL JOINT FUSION Right 02/04/2014   Procedure: FUSION DISTAL INTERPHALANGEAL JOINT RIGHT INDEX FINGER ;  Surgeon: Daryll Brod, MD;  Location: [place];  Service: Orthopedics;  Laterality: Right;  . EYE SURGERY Bilateral    Cataract surgery with lens implant  .  KNEE SURGERY  2009,2011   partial knee  . OPEN REDUCTION INTERNAL FIXATION (ORIF) PROXIMAL PHALANX Left 08/30/2013   Procedure: OPEN REDUCTION INTERNAL FIXATION (ORIF) PROXIMAL PHALANX FRACTURE LEFT SMALL FINGER; SPLINT RING FINGER;  Surgeon: Wynonia Sours, MD;  Location: [place];  Service: Orthopedics;  Laterality: Left;  . POSTERIOR LUMBAR FUSION 4 LEVEL N/A 07/17/2017   Procedure: Thoracic Ten - Lumbar Five revison of hardware with Mazor;  Surgeon: Kristeen Miss, MD;  Location: [place];  Service: Neurosurgery;  Laterality: N/A;  Thoracic/Lumbar  . PTOSIS REPAIR Bilateral 07/27/2015   Procedure: PTOSIS REPAIR;  Surgeon: Cristine Polio, MD;  Location[place];  Service: Plastics;  Laterality: Bilateral;  . SHOULDER SURGERY     rt rcr,and lt  . TONSILLECTOMY      There were no vitals filed for this visit.  Subjective Assessment - 02/08/18 1411    Subjective  Reports that she is walking more with just rhe cane inside the home    Currently in Pain?  No/denies                       [facility] Adult PT Treatment/Exercise - 02/08/18 0001      Ambulation/Gait   Gait Comments  SPC walking, down the hall without rest and back  High Level Balance   High Level Balance Activities  Side stepping;Backward walking;Direction changes      Exercises   Exercises  Lumbar      Lumbar Exercises: Stretches   Gastroc Stretch  3 reps;20 seconds      Lumbar Exercises: Aerobic   Recumbent Bike  level 2 x 6 minutes      Lumbar Exercises: Machines for Strengthening   Cybex Knee Extension  5# right only 3x8    Cybex Knee Flexion  right only 20# 3x10, cues needed to get better flexion    Leg Press  40# 3x10    Other Lumbar Machine Exercise  seated row and lats 35# 3x10 each    Other Lumbar Machine Exercise  35# tricep, 5# each arm straight arm pulls      Lumbar Exercises: Seated   Other Seated Lumbar Exercises  green tband PF, foot on sit fit all motions       Electrical Stimulation   Electrical Stimulation Location  right anterior tibialis with her in supine working on [employer]    Clinical biochemist Parameters  4 on 12 off    Electrical Stimulation Goals  Neuromuscular facilitation               PT Short Term Goals - 10/16/17 1144      PT SHORT TERM GOAL #1   Title  independent with initial HEP    Status  Achieved        PT Long Term Goals - 02/01/18 0914      PT LONG TERM GOAL #1   Title  decrease pain 50%    Status  Achieved      PT LONG TERM GOAL #2   Title  increase Lumbar ROM 25%    Status  Achieved      PT LONG TERM GOAL #3   Title  increase LE strength to 4+/5    Status  Partially Met      PT LONG TERM GOAL #4   Title  go up and down stairs step over step    Status  Partially Met      PT LONG TERM GOAL #5   Title  walk 600 feet without assistive device    Status  Partially Met            Plan - 02/08/18 1548    Clinical Impression Statement  Patient with better motions walking better posture, less toe drag walking on level surfaces today.      PT Next Visit Plan  work on balance and functional gait    Consulted and Agree with Plan of Care  Patient       Patient will benefit from skilled therapeutic intervention in order to improve the following deficits and impairments:  Abnormal gait, Cardiopulmonary status limiting activity, Decreased activity tolerance, Decreased balance, Decreased mobility, Decreased strength, Postural dysfunction, Improper body mechanics, Impaired flexibility, Pain, Increased muscle spasms, Decreased range of motion, Difficulty walking  Visit Diagnosis: Muscle weakness (generalized)  Acute bilateral low back pain with bilateral sciatica  Difficulty in walking, not elsewhere classified     Problem List Patient Active Problem List   Diagnosis Date Noted  . Osteoarthritis of right knee   . Vertigo   . DDD (degenerative disc  disease), cervical   . Benign essential HTN   . History of DVT (deep vein thrombosis)   . Tachycardia   . Steroid-induced hyperglycemia   .  Hypothyroidism   . Neuropathic pain   . Acute blood loss anemia   . S/P lumbar fusion   . Closed L5 vertebral fracture ([place]) 08/31/2017  . Closed fracture of fifth lumbar vertebra ([place]) 08/30/2017  . Lumbar vertebral fracture ([place]) 07/17/2017  . Scoliosis 06/13/2017    Sumner Boast., PT 02/08/2018, 3:56 PM  [place] [address] [place], [place], [address] Phone: [phone]   Fax:  [phone]  Name: [name] MRN: [id number] Date of Birth: 11-15-1932

## 2018-02-13 ENCOUNTER — Ambulatory Visit: Payer: Medicare Other | Admitting: Physical Therapy

## 2018-02-13 ENCOUNTER — Encounter: Payer: Self-pay | Admitting: Physical Therapy

## 2018-02-13 DIAGNOSIS — M5442 Lumbago with sciatica, left side: Secondary | ICD-10-CM

## 2018-02-13 DIAGNOSIS — R262 Difficulty in walking, not elsewhere classified: Secondary | ICD-10-CM

## 2018-02-13 DIAGNOSIS — M6281 Muscle weakness (generalized): Secondary | ICD-10-CM

## 2018-02-13 DIAGNOSIS — M5441 Lumbago with sciatica, right side: Secondary | ICD-10-CM

## 2018-02-13 NOTE — Therapy (Signed)
[place] [address] [place], [place], [address] Phone: [phone]   Fax:  [phone]  Physical Therapy Treatment  Patient Details  Name: [name] MRN: [id number] Date of Birth: Apr 01, 1933 Referring Provider (PT): Elsner   Encounter Date: 02/13/2018  PT End of Session - 02/13/18 1612    Visit Number  37    Date for PT Re-Evaluation  03/02/18    PT Start Time  1353    PT Stop Time  1440    PT Time Calculation (min)  47 min    Activity Tolerance  Patient tolerated treatment well    Behavior During Therapy  [facility] for tasks assessed/performed       Past Medical History:  Diagnosis Date  . Arthritis   . GERD (gastroesophageal reflux disease)   . History of blood transfusion   . Hypothyroidism   . PONV (postoperative nausea and vomiting)   . Ptosis of both eyelids   . Vertigo   . Wears glasses    reading    Past Surgical History:  Procedure Laterality Date  . ABDOMINAL HYSTERECTOMY  1986  . APPLICATION OF ROBOTIC ASSISTANCE FOR SPINAL PROCEDURE N/A 07/17/2017   Procedure: APPLICATION OF ROBOTIC ASSISTANCE FOR SPINAL PROCEDURE;  Surgeon: Kristeen Miss, MD;  Location: [place];  Service: Neurosurgery;  Laterality: N/A;  . APPLICATION OF ROBOTIC ASSISTANCE FOR SPINAL PROCEDURE N/A 09/01/2017   Procedure: APPLICATION OF ROBOTIC ASSISTANCE FOR SPINAL PROCEDURE;  Surgeon: Kristeen Miss, MD;  Location: [place];  Service: Neurosurgery;  Laterality: N/A;  . [place]  2000   right  . CARPAL TUNNEL RELEASE  2012   left  . COLONOSCOPY    . CYSTOCELE REPAIR  2007   sling  . DISTAL INTERPHALANGEAL JOINT FUSION Right 02/04/2014   Procedure: FUSION DISTAL INTERPHALANGEAL JOINT RIGHT INDEX FINGER ;  Surgeon: Daryll Brod, MD;  Location: [place];  Service: Orthopedics;  Laterality: Right;  . EYE SURGERY Bilateral    Cataract surgery with lens implant  .  KNEE SURGERY  2009,2011   partial knee  . OPEN REDUCTION INTERNAL FIXATION (ORIF) PROXIMAL PHALANX Left 08/30/2013   Procedure: OPEN REDUCTION INTERNAL FIXATION (ORIF) PROXIMAL PHALANX FRACTURE LEFT SMALL FINGER; SPLINT RING FINGER;  Surgeon: Wynonia Sours, MD;  Location: [place];  Service: Orthopedics;  Laterality: Left;  . POSTERIOR LUMBAR FUSION 4 LEVEL N/A 07/17/2017   Procedure: Thoracic Ten - Lumbar Five revison of hardware with Mazor;  Surgeon: Kristeen Miss, MD;  Location: [place];  Service: Neurosurgery;  Laterality: N/A;  Thoracic/Lumbar  . PTOSIS REPAIR Bilateral 07/27/2015   Procedure: PTOSIS REPAIR;  Surgeon: Cristine Polio, MD;  Location: [place];  Service: Plastics;  Laterality: Bilateral;  . SHOULDER SURGERY     rt rcr,and lt  . TONSILLECTOMY      There were no vitals filed for this visit.  Subjective Assessment - 02/13/18 1357    Subjective  I really think I am getting better    Currently in Pain?  No/denies                       [facility] Adult PT Treatment/Exercise - 02/13/18 0001      Ambulation/Gait   Gait Comments  SPC walking, down the hall without rest and back      High Level Balance  High Level Balance Comments  worked on some standing on the airex, head turns, this was very difficult for her, then we got the newer airex and tried to do this, this was easier but still difficult, 6" toe touches      Lumbar Exercises: Stretches   Gastroc Stretch  3 reps;20 seconds      Lumbar Exercises: Aerobic   Recumbent Bike  level 2 x 6 minutes      Lumbar Exercises: Machines for Strengthening   Cybex Knee Extension  5# right only 3x8    Cybex Knee Flexion  right only 20# 3x10, cues needed to get better flexion               PT Short Term Goals - 10/16/17 1144      PT SHORT TERM GOAL #1   Title  independent with initial HEP    Status  Achieved        PT Long Term Goals - 02/01/18 0914      PT LONG TERM  GOAL #1   Title  decrease pain 50%    Status  Achieved      PT LONG TERM GOAL #2   Title  increase Lumbar ROM 25%    Status  Achieved      PT LONG TERM GOAL #3   Title  increase LE strength to 4+/5    Status  Partially Met      PT LONG TERM GOAL #4   Title  go up and down stairs step over step    Status  Partially Met      PT LONG TERM GOAL #5   Title  walk 600 feet without assistive device    Status  Partially Met            Plan - 02/13/18 1613    Clinical Impression Statement  Worked more today on the single leg stance on the bad leg with the thought of stimulating the nerves and mms, the airex standing was difficulty and scary for the patient but I think this will be good for her to do. iwth [place]    PT Next Visit Plan  continue to work on the gait and may focus some on the airex standing and the single leg standing    Consulted and Agree with Plan of Care  Patient       Patient will benefit from skilled therapeutic intervention in order to improve the following deficits and impairments:  Abnormal gait, Cardiopulmonary status limiting activity, Decreased activity tolerance, Decreased balance, Decreased mobility, Decreased strength, Postural dysfunction, Improper body mechanics, Impaired flexibility, Pain, Increased muscle spasms, Decreased range of motion, Difficulty walking  Visit Diagnosis: Muscle weakness (generalized)  Acute bilateral low back pain with bilateral sciatica  Difficulty in walking, not elsewhere classified     Problem List Patient Active Problem List   Diagnosis Date Noted  . Osteoarthritis of right knee   . Vertigo   . DDD (degenerative disc disease), cervical   . Benign essential HTN   . History of DVT (deep vein thrombosis)   . Tachycardia   . Steroid-induced hyperglycemia   . Hypothyroidism   . Neuropathic pain   . Acute blood loss anemia   . S/P lumbar fusion   . Closed L5 vertebral fracture ([place]) 08/31/2017  . Closed fracture of  fifth lumbar vertebra ([place]) 08/30/2017  . Lumbar vertebral fracture ([place]) 07/17/2017  . Scoliosis 06/13/2017    Sumner Boast., PT 02/13/2018, 4:22  PM  [place], [place], [address] Phone: [phone]   Fax:  [phone]  Name: [name] MRN: [id number] Date of Birth: May 06, 1932

## 2018-02-15 ENCOUNTER — Ambulatory Visit: Payer: Medicare Other | Admitting: Physical Therapy

## 2018-02-15 ENCOUNTER — Encounter: Payer: Self-pay | Admitting: Physical Therapy

## 2018-02-15 DIAGNOSIS — M5442 Lumbago with sciatica, left side: Secondary | ICD-10-CM

## 2018-02-15 DIAGNOSIS — M6281 Muscle weakness (generalized): Secondary | ICD-10-CM | POA: Diagnosis not present

## 2018-02-15 DIAGNOSIS — M5441 Lumbago with sciatica, right side: Secondary | ICD-10-CM

## 2018-02-15 DIAGNOSIS — R262 Difficulty in walking, not elsewhere classified: Secondary | ICD-10-CM

## 2018-02-15 NOTE — Therapy (Signed)
[place], [place], [address] Phone: [phone]   Fax:  [phone]  Physical Therapy Treatment  Patient Details  Name: [name] MRN: [id number] Date of Birth: 1933-03-15 Referring Provider (PT): Elsner   Encounter Date: 02/15/2018  PT End of Session - 02/15/18 1435    Visit Number  38    Date for PT Re-Evaluation  03/02/18    PT Start Time  5852    PT Stop Time  1446    PT Time Calculation (min)  51 min    Activity Tolerance  Patient tolerated treatment well    Behavior During Therapy  [facility] for tasks assessed/performed       Past Medical History:  Diagnosis Date  . Arthritis   . GERD (gastroesophageal reflux disease)   . History of blood transfusion   . Hypothyroidism   . PONV (postoperative nausea and vomiting)   . Ptosis of both eyelids   . Vertigo   . Wears glasses    reading    Past Surgical History:  Procedure Laterality Date  . ABDOMINAL HYSTERECTOMY  1986  . APPLICATION OF ROBOTIC ASSISTANCE FOR SPINAL PROCEDURE N/A 07/17/2017   Procedure: APPLICATION OF ROBOTIC ASSISTANCE FOR SPINAL PROCEDURE;  Surgeon: Kristeen Miss, MD;  Location: [place];  Service: Neurosurgery;  Laterality: N/A;  . APPLICATION OF ROBOTIC ASSISTANCE FOR SPINAL PROCEDURE N/A 09/01/2017   Procedure: APPLICATION OF ROBOTIC ASSISTANCE FOR SPINAL PROCEDURE;  Surgeon: Kristeen Miss, MD;  Location: [place];  Service: Neurosurgery;  Laterality: N/A;  . [place]  2000   right  . CARPAL TUNNEL RELEASE  2012   left  . COLONOSCOPY    . CYSTOCELE REPAIR  2007   sling  . DISTAL INTERPHALANGEAL JOINT FUSION Right 02/04/2014   Procedure: FUSION DISTAL INTERPHALANGEAL JOINT RIGHT INDEX FINGER ;  Surgeon: Daryll Brod, MD;  Location: [place];  Service: Orthopedics;  Laterality: Right;  . EYE SURGERY Bilateral    Cataract surgery with lens implant  .  KNEE SURGERY  2009,2011   partial knee  . OPEN REDUCTION INTERNAL FIXATION (ORIF) PROXIMAL PHALANX Left 08/30/2013   Procedure: OPEN REDUCTION INTERNAL FIXATION (ORIF) PROXIMAL PHALANX FRACTURE LEFT SMALL FINGER; SPLINT RING FINGER;  Surgeon: Wynonia Sours, MD;  Location: [place];  Service: Orthopedics;  Laterality: Left;  . POSTERIOR LUMBAR FUSION 4 LEVEL N/A 07/17/2017   Procedure: Thoracic Ten - Lumbar Five revison of hardware with Mazor;  Surgeon: Kristeen Miss, MD;  Location: [place];  Service: Neurosurgery;  Laterality: N/A;  Thoracic/Lumbar  . PTOSIS REPAIR Bilateral 07/27/2015   Procedure: PTOSIS REPAIR;  Surgeon: Cristine Polio, MD;  Location: [place];  Service: Plastics;  Laterality: Bilateral;  . SHOULDER SURGERY     rt rcr,and lt  . TONSILLECTOMY      There were no vitals filed for this visit.  Subjective Assessment - 02/15/18 1358    Subjective  'I am a little more tired today and mike back is sore    Currently in Pain?  Yes    Pain Score  3     Pain Location  Back    Pain Orientation  Lower    Aggravating Factors   I may be having pain due to me doing a lot more  [place] Adult PT Treatment/Exercise - 02/15/18 0001      Ambulation/Gait   Gait Comments  SPC walking, down the hall without rest and backx 2      High Level Balance   High Level Balance Comments  tried single leg stance ont he right leg with the left leg up on a dynamic surface, this was very scary for her and she did not do well with this      Lumbar Exercises: Aerobic   Recumbent Bike  level 2 x 6 minutes    Nustep  level 6 x 6 minutes      Lumbar Exercises: Machines for Strengthening   Cybex Lumbar Extension  black tband 2x 15    Cybex Knee Extension  5# right only 3x8    Cybex Knee Flexion  right only 20# 3x10, cues needed to get better flexion      Lumbar Exercises: Standing   Other Standing Lumbar Exercises  worked on some  resisted gait with the walker, fwd, and side stepping, tried backward and she was too scared      Acupuncturist Location  right anterior tibialis with her in supine working on [employer]    Clinical biochemist Parameters  4 on 12 off    Electrical Stimulation Goals  Neuromuscular facilitation               PT Short Term Goals - 10/16/17 1144      PT SHORT TERM GOAL #1   Title  independent with initial HEP    Status  Achieved        PT Long Term Goals - 02/15/18 1436      PT LONG TERM GOAL #3   Title  increase LE strength to 4+/5    Status  Partially Met      PT LONG TERM GOAL #4   Title  go up and down stairs step over step    Status  Partially Met            Plan - 02/15/18 1436    Clinical Impression Statement  The single leg stance really does scare her, she is doing better with her foot drop with teh long walks using the [facility]    PT Next Visit Plan  continue to work on the gait and may focus some on the airex standing and the single leg standing    Consulted and Agree with Plan of Care  Patient       Patient will benefit from skilled therapeutic intervention in order to improve the following deficits and impairments:  Abnormal gait, Cardiopulmonary status limiting activity, Decreased activity tolerance, Decreased balance, Decreased mobility, Decreased strength, Postural dysfunction, Improper body mechanics, Impaired flexibility, Pain, Increased muscle spasms, Decreased range of motion, Difficulty walking  Visit Diagnosis: Muscle weakness (generalized)  Acute bilateral low back pain with bilateral sciatica  Difficulty in walking, not elsewhere classified     Problem List Patient Active Problem List   Diagnosis Date Noted  . Osteoarthritis of right knee   . Vertigo   . DDD (degenerative disc disease), cervical   . Benign essential HTN   . History of DVT (deep vein thrombosis)    . Tachycardia   . Steroid-induced hyperglycemia   . Hypothyroidism   . Neuropathic pain   . Acute blood loss anemia   . S/P lumbar fusion   . Closed L5 vertebral fracture ([place]) 08/31/2017  .  Closed fracture of fifth lumbar vertebra ([place]) 08/30/2017  . Lumbar vertebral fracture ([place]) 07/17/2017  . Scoliosis 06/13/2017    Sumner Boast., PT 02/15/2018, 2:37 PM  [place] [address] [place], [place], [address] Phone: [phone]   Fax:  [phone]  Name: CLAYTON JARMON MRN: [id number] Date of Birth: Nov 25, 1932

## 2018-02-20 ENCOUNTER — Encounter: Payer: Self-pay | Admitting: Physical Therapy

## 2018-02-20 ENCOUNTER — Ambulatory Visit: Payer: Medicare Other | Admitting: Physical Therapy

## 2018-02-20 DIAGNOSIS — M5441 Lumbago with sciatica, right side: Secondary | ICD-10-CM

## 2018-02-20 DIAGNOSIS — M6281 Muscle weakness (generalized): Secondary | ICD-10-CM

## 2018-02-20 DIAGNOSIS — M62830 Muscle spasm of back: Secondary | ICD-10-CM

## 2018-02-20 DIAGNOSIS — M25561 Pain in right knee: Secondary | ICD-10-CM

## 2018-02-20 DIAGNOSIS — M5442 Lumbago with sciatica, left side: Secondary | ICD-10-CM

## 2018-02-20 DIAGNOSIS — R262 Difficulty in walking, not elsewhere classified: Secondary | ICD-10-CM

## 2018-02-20 NOTE — Therapy (Signed)
[facility] [place], [place], [address] Phone: [phone]   Fax:  [phone]  Physical Therapy Treatment  Patient Details  Name: [name] MRN: [id number] Date of Birth: [birth year] Referring Provider (PT): Elsner   Encounter Date: 02/20/2018  PT End of Session - 02/20/18 1429    Visit Number  39    Date for PT Re-Evaluation  03/02/18    PT Start Time  1354    PT Stop Time  1442    PT Time Calculation (min)  48 min    Activity Tolerance  Patient tolerated treatment well    Behavior During Therapy  [facility] for tasks assessed/performed       Past Medical History:  Diagnosis Date  . Arthritis   . GERD (gastroesophageal reflux disease)   . History of blood transfusion   . Hypothyroidism   . PONV (postoperative nausea and vomiting)   . Ptosis of both eyelids   . Vertigo   . Wears glasses    reading    Past Surgical History:  Procedure Laterality Date  . ABDOMINAL HYSTERECTOMY  1986  . APPLICATION OF ROBOTIC ASSISTANCE FOR SPINAL PROCEDURE N/A 07/17/2017   Procedure: APPLICATION OF ROBOTIC ASSISTANCE FOR SPINAL PROCEDURE;  Surgeon: Kristeen Miss, MD;  Location: [place];  Service: Neurosurgery;  Laterality: N/A;  . APPLICATION OF ROBOTIC ASSISTANCE FOR SPINAL PROCEDURE N/A 09/01/2017   Procedure: APPLICATION OF ROBOTIC ASSISTANCE FOR SPINAL PROCEDURE;  Surgeon: Kristeen Miss, MD;  Location: [place];  Service: Neurosurgery;  Laterality: N/A;  . [place]  2000   right  . CARPAL TUNNEL RELEASE  2012   left  . COLONOSCOPY    . CYSTOCELE REPAIR  2007   sling  . DISTAL INTERPHALANGEAL JOINT FUSION Right 02/04/2014   Procedure: FUSION DISTAL INTERPHALANGEAL JOINT RIGHT INDEX FINGER ;  Surgeon: Daryll Brod, MD;  Location: [place];  Service: Orthopedics;  Laterality: Right;  . EYE SURGERY Bilateral    Cataract surgery with lens implant  .  KNEE SURGERY  2009,2011   partial knee  . OPEN REDUCTION INTERNAL FIXATION (ORIF) PROXIMAL PHALANX Left 08/30/2013   Procedure: OPEN REDUCTION INTERNAL FIXATION (ORIF) PROXIMAL PHALANX FRACTURE LEFT SMALL FINGER; SPLINT RING FINGER;  Surgeon: Wynonia Sours, MD;  Location: [place];  Service: Orthopedics;  Laterality: Left;  . POSTERIOR LUMBAR FUSION 4 LEVEL N/A 07/17/2017   Procedure: Thoracic Ten - Lumbar Five revison of hardware with Mazor;  Surgeon: Kristeen Miss, MD;  Location: [place];  Service: Neurosurgery;  Laterality: N/A;  Thoracic/Lumbar  . PTOSIS REPAIR Bilateral 07/27/2015   Procedure: PTOSIS REPAIR;  Surgeon: Cristine Polio, MD;  Location: [place];  Service: Plastics;  Laterality: Bilateral;  . SHOULDER SURGERY     rt rcr,and lt  . TONSILLECTOMY      There were no vitals filed for this visit.  Subjective Assessment - 02/20/18 1405    Subjective  I am slow and not moving as well today, I have been up doing stuff all morning    Currently in Pain?  Yes    Pain Score  3     Pain Location  Back    Pain Orientation  Lower    Aggravating Factors   activity  [place] [facility] Adult PT Treatment/Exercise - 02/20/18 0001      High Level Balance   High Level Balance Activities  Side stepping;Backward walking    High Level Balance Comments  tried single leg stance ont he right leg with the left leg up on a dynamic surface, this was very scary for her and she did not do well with this, very light hand hold assist, tried standing on airex, head turns and using a SPC, then tried on airex and eyes closed, this was very scary for her.      Lumbar Exercises: Aerobic   Recumbent Bike  level 2 x 6 minutes    Nustep  level 6 x 6 minutes      Lumbar Exercises: Machines for Strengthening   Other Lumbar Machine Exercise  seated row and lats 35# 3x10 each    Other Lumbar Machine Exercise  seated chest press 5# 3x10      Lumbar  Exercises: Standing   Other Standing Lumbar Exercises  High knee marching with the walker               PT Short Term Goals - 10/16/17 1144      PT SHORT TERM GOAL #1   Title  independent with initial HEP    Status  Achieved        PT Long Term Goals - 02/15/18 1436      PT LONG TERM GOAL #3   Title  increase LE strength to 4+/5    Status  Partially Met      PT LONG TERM GOAL #4   Title  go up and down stairs step over step    Status  Partially Met            Plan - 02/20/18 1430    Clinical Impression Statement  Patient has increased c/o right foot "feeling flat and plopping"  She does report that she had some increased activity today.  She does have pretty high fear when trying to stand on airex or with the SLS.  She is very afraid of the backward walking    PT Next Visit Plan  continue to work on the gait and may focus some on the airex standing and the single leg standing    Consulted and Agree with Plan of Care  Patient       Patient will benefit from skilled therapeutic intervention in order to improve the following deficits and impairments:  Abnormal gait, Cardiopulmonary status limiting activity, Decreased activity tolerance, Decreased balance, Decreased mobility, Decreased strength, Postural dysfunction, Improper body mechanics, Impaired flexibility, Pain, Increased muscle spasms, Decreased range of motion, Difficulty walking  Visit Diagnosis: Muscle weakness (generalized)  Acute bilateral low back pain with bilateral sciatica  Difficulty in walking, not elsewhere classified  Muscle spasm of back  Acute pain of right knee     Problem List Patient Active Problem List   Diagnosis Date Noted  . Osteoarthritis of right knee   . Vertigo   . DDD (degenerative disc disease), cervical   . Benign essential HTN   . History of DVT (deep vein thrombosis)   . Tachycardia   . Steroid-induced hyperglycemia   . Hypothyroidism   . Neuropathic pain   .  Acute blood loss anemia   . S/P lumbar fusion   . Closed L5 vertebral fracture ([place]) 08/31/2017  . Closed fracture of fifth lumbar vertebra ([place]) 08/30/2017  . Lumbar vertebral fracture ([place]) 07/17/2017  . Scoliosis 06/13/2017  Sumner Boast., PT 02/20/2018, 2:33 PM  [facility] [address] [place], [place], [address] Phone: [phone]   Fax:  [phone]  Name: [name] MRN: [id number] Date of Birth: Jan 18, 1933

## 2018-02-22 ENCOUNTER — Ambulatory Visit: Payer: Medicare Other | Admitting: Physical Therapy

## 2018-02-22 ENCOUNTER — Encounter: Payer: Self-pay | Admitting: Physical Therapy

## 2018-02-22 DIAGNOSIS — R262 Difficulty in walking, not elsewhere classified: Secondary | ICD-10-CM

## 2018-02-22 DIAGNOSIS — M6281 Muscle weakness (generalized): Secondary | ICD-10-CM | POA: Diagnosis not present

## 2018-02-22 DIAGNOSIS — M5442 Lumbago with sciatica, left side: Secondary | ICD-10-CM

## 2018-02-22 DIAGNOSIS — M5441 Lumbago with sciatica, right side: Secondary | ICD-10-CM

## 2018-02-22 NOTE — Therapy (Addendum)
[place] [address] [place], [place], [address] Phone: [phone]   Fax:  [phone] Progress Note Reporting Period 01/23/2018 to 02/22/18 for visits 31-40  See note below for Objective Data and Assessment of Progress/Goals.      Physical Therapy Treatment  Patient Details  Name: [name] MRN: [id number] Date of Birth: 11-Jul-1932 Referring Provider (PT): Elsner   Encounter Date: 02/22/2018  PT End of Session - 02/22/18 1430    Visit Number  40    Date for PT Re-Evaluation  03/02/18    PT Start Time  1350    PT Stop Time  1438    PT Time Calculation (min)  48 min    Activity Tolerance  Patient tolerated treatment well    Behavior During Therapy  [facility] for tasks assessed/performed       Past Medical History:  Diagnosis Date  . Arthritis   . GERD (gastroesophageal reflux disease)   . History of blood transfusion   . Hypothyroidism   . PONV (postoperative nausea and vomiting)   . Ptosis of both eyelids   . Vertigo   . Wears glasses    reading    Past Surgical History:  Procedure Laterality Date  . ABDOMINAL HYSTERECTOMY  1986  . APPLICATION OF ROBOTIC ASSISTANCE FOR SPINAL PROCEDURE N/A 07/17/2017   Procedure: APPLICATION OF ROBOTIC ASSISTANCE FOR SPINAL PROCEDURE;  Surgeon: Kristeen Miss, MD;  Location: [place];  Service: Neurosurgery;  Laterality: N/A;  . APPLICATION OF ROBOTIC ASSISTANCE FOR SPINAL PROCEDURE N/A 09/01/2017   Procedure: APPLICATION OF ROBOTIC ASSISTANCE FOR SPINAL PROCEDURE;  Surgeon: Kristeen Miss, MD;  Location: [place];  Service: Neurosurgery;  Laterality: N/A;  . [place]  2000   right  . CARPAL TUNNEL RELEASE  2012   left  . COLONOSCOPY    . CYSTOCELE REPAIR  2007   sling  . DISTAL INTERPHALANGEAL JOINT FUSION Right 02/04/2014   Procedure: FUSION DISTAL INTERPHALANGEAL JOINT RIGHT INDEX FINGER ;  Surgeon: Daryll Brod, MD;   Location: [place] [facility];  Service: Orthopedics;  Laterality: Right;  . EYE SURGERY Bilateral    Cataract surgery with lens implant  . KNEE SURGERY  2009,2011   partial knee  . OPEN REDUCTION INTERNAL FIXATION (ORIF) PROXIMAL PHALANX Left 08/30/2013   Procedure: OPEN REDUCTION INTERNAL FIXATION (ORIF) PROXIMAL PHALANX FRACTURE LEFT SMALL FINGER; SPLINT RING FINGER;  Surgeon: Wynonia Sours, MD;  Location: [place];  Service: Orthopedics;  Laterality: Left;  . POSTERIOR LUMBAR FUSION 4 LEVEL N/A 07/17/2017   Procedure: Thoracic Ten - Lumbar Five revison of hardware with Mazor;  Surgeon: Kristeen Miss, MD;  Location: [place];  Service: Neurosurgery;  Laterality: N/A;  Thoracic/Lumbar  . PTOSIS REPAIR Bilateral 07/27/2015   Procedure: PTOSIS REPAIR;  Surgeon: Cristine Polio, MD;  Location: [place];  Service: Plastics;  Laterality: Bilateral;  . SHOULDER SURGERY     rt rcr,and lt  . TONSILLECTOMY      There were no vitals filed for this visit.  Subjective Assessment - 02/22/18 1353    Subjective  I have been busy, my foot is dropping more today    Currently in Pain?  No/denies                       [facility] Adult PT Treatment/Exercise - 02/22/18 0001  Ambulation/Gait   Gait Comments  gait outside with [facility] also used a tband to help with DF, negotiating numerous curbs and on the grass and uneven surfaces      Lumbar Exercises: Machines for Strengthening   Cybex Lumbar Extension  black tband 3x 15, seated Bosu behind her back to Bosu  and then up for abs    Cybex Knee Extension  5# right only 3x8    Cybex Knee Flexion  right only 20# 3x10, cues needed to get better flexion    Other Lumbar Machine Exercise  seated row and lats 35# 3x10 each    Other Lumbar Machine Exercise  10# standing good posture straight arm pulls      Lumbar Exercises: Standing   Other Standing Lumbar Exercises  High knee marching with the walker                PT Short Term Goals - 10/16/17 1144      PT SHORT TERM GOAL #1   Title  independent with initial HEP    Status  Achieved        PT Long Term Goals - 02/22/18 1432      PT LONG TERM GOAL #4   Title  go up and down stairs step over step    Status  Partially Met      PT LONG TERM GOAL #5   Status  Partially Met            Plan - 02/22/18 1430    Clinical Impression Statement  Patient has been active and reports some fatigue in the right foot, she did well with the walking outside, she liked the tband strap I used to help with DF, the issue is she has some DF and I hate to have her use an AFO if she has the motions, it just fatigues so quickly.    PT Next Visit Plan  continue to work on the gait and may focus some on the airex standing and the single leg standing    Consulted and Agree with Plan of Care  Patient       Patient will benefit from skilled therapeutic intervention in order to improve the following deficits and impairments:  Abnormal gait, Cardiopulmonary status limiting activity, Decreased activity tolerance, Decreased balance, Decreased mobility, Decreased strength, Postural dysfunction, Improper body mechanics, Impaired flexibility, Pain, Increased muscle spasms, Decreased range of motion, Difficulty walking  Visit Diagnosis: Muscle weakness (generalized)  Acute bilateral low back pain with bilateral sciatica  Difficulty in walking, not elsewhere classified     Problem List Patient Active Problem List   Diagnosis Date Noted  . Osteoarthritis of right knee   . Vertigo   . DDD (degenerative disc disease), cervical   . Benign essential HTN   . History of DVT (deep vein thrombosis)   . Tachycardia   . Steroid-induced hyperglycemia   . Hypothyroidism   . Neuropathic pain   . Acute blood loss anemia   . S/P lumbar fusion   . Closed L5 vertebral fracture ([place]) 08/31/2017  . Closed fracture of fifth lumbar vertebra ([place]) 08/30/2017   . Lumbar vertebral fracture ([place]) 07/17/2017  . Scoliosis 06/13/2017    Sumner Boast., PT 02/22/2018, 2:33 PM  [place] [address] [place], [place], [address] Phone: [phone]   Fax:  [phone]  Name: [name] MRN: [id number] Date of Birth: 18-Feb-1933

## 2018-02-27 ENCOUNTER — Ambulatory Visit: Payer: Medicare Other | Admitting: Physical Therapy

## 2018-02-27 ENCOUNTER — Encounter: Payer: Self-pay | Admitting: Physical Therapy

## 2018-02-27 DIAGNOSIS — R262 Difficulty in walking, not elsewhere classified: Secondary | ICD-10-CM

## 2018-02-27 DIAGNOSIS — M62830 Muscle spasm of back: Secondary | ICD-10-CM

## 2018-02-27 DIAGNOSIS — M5441 Lumbago with sciatica, right side: Secondary | ICD-10-CM

## 2018-02-27 DIAGNOSIS — M6281 Muscle weakness (generalized): Secondary | ICD-10-CM

## 2018-02-27 DIAGNOSIS — M5442 Lumbago with sciatica, left side: Secondary | ICD-10-CM

## 2018-02-27 NOTE — Therapy (Signed)
[place] [address] [place], [place], [address] Phone: [phone]   Fax:  [phone]  Physical Therapy Treatment  Patient Details  Name: [name] MRN: [id number] Date of Birth: Jun 14, 1932 Referring Provider (PT): Elsner   Encounter Date: 02/27/2018  PT End of Session - 02/27/18 1427    Visit Number  41    Date for PT Re-Evaluation  03/02/18    PT Start Time  1350    PT Stop Time  1435    PT Time Calculation (min)  45 min    Activity Tolerance  Patient tolerated treatment well    Behavior During Therapy  [facility] for tasks assessed/performed       Past Medical History:  Diagnosis Date  . Arthritis   . GERD (gastroesophageal reflux disease)   . History of blood transfusion   . Hypothyroidism   . PONV (postoperative nausea and vomiting)   . Ptosis of both eyelids   . Vertigo   . Wears glasses    reading    Past Surgical History:  Procedure Laterality Date  . ABDOMINAL HYSTERECTOMY  1986  . APPLICATION OF ROBOTIC ASSISTANCE FOR SPINAL PROCEDURE N/A 07/17/2017   Procedure: APPLICATION OF ROBOTIC ASSISTANCE FOR SPINAL PROCEDURE;  Surgeon: Kristeen Miss, MD;  Location: [place];  Service: Neurosurgery;  Laterality: N/A;  . APPLICATION OF ROBOTIC ASSISTANCE FOR SPINAL PROCEDURE N/A 09/01/2017   Procedure: APPLICATION OF ROBOTIC ASSISTANCE FOR SPINAL PROCEDURE;  Surgeon: Kristeen Miss, MD;  Location: [place];  Service: Neurosurgery;  Laterality: N/A;  . [place]  2000   right  . CARPAL TUNNEL RELEASE  2012   left  . COLONOSCOPY    . CYSTOCELE REPAIR  2007   sling  . DISTAL INTERPHALANGEAL JOINT FUSION Right 02/04/2014   Procedure: FUSION DISTAL INTERPHALANGEAL JOINT RIGHT INDEX FINGER ;  Surgeon: Daryll Brod, MD;  Location: [place];  Service: Orthopedics;  Laterality: Right;  . EYE SURGERY Bilateral    Cataract surgery with lens implant  .  KNEE SURGERY  2009,2011   partial knee  . OPEN REDUCTION INTERNAL FIXATION (ORIF) PROXIMAL PHALANX Left 08/30/2013   Procedure: OPEN REDUCTION INTERNAL FIXATION (ORIF) PROXIMAL PHALANX FRACTURE LEFT SMALL FINGER; SPLINT RING FINGER;  Surgeon: Wynonia Sours, MD;  Location: [facility];  Service: Orthopedics;  Laterality: Left;  . POSTERIOR LUMBAR FUSION 4 LEVEL N/A 07/17/2017   Procedure: Thoracic Ten - Lumbar Five revison of hardware with Mazor;  Surgeon: Kristeen Miss, MD;  Location: [place];  Service: Neurosurgery;  Laterality: N/A;  Thoracic/Lumbar  . PTOSIS REPAIR Bilateral 07/27/2015   Procedure: PTOSIS REPAIR;  Surgeon: Cristine Polio, MD;  Location: [place];  Service: Plastics;  Laterality: Bilateral;  . SHOULDER SURGERY     rt rcr,and lt  . TONSILLECTOMY      There were no vitals filed for this visit.  Subjective Assessment - 02/27/18 1358    Subjective  I have again been busy since we are getting ready for Thanksgiving    Currently in Pain?  No/denies                       [facility] Adult PT Treatment/Exercise - 02/27/18 0001      Ambulation/Gait   Gait Comments  gait outside around the building with a SPC and HHA  High Level Balance   High Level Balance Activities  Side stepping;Backward walking    High Level Balance Comments  tried single leg stance ont he right leg with the left leg up on a dynamic surface, this was very scary for her and she did not do well with this, very light hand hold assist, tried standing on airex, head turns and using a SPC, then tried on airex and eyes closed, this was very scary for her.      Lumbar Exercises: Aerobic   Recumbent Bike  level 2 x 6 minutes      Lumbar Exercises: Machines for Strengthening   Cybex Knee Flexion  right only 20# 3x10, cues needed to get better flexion               PT Short Term Goals - 10/16/17 1144      PT SHORT TERM GOAL #1   Title  independent with  initial HEP    Status  Achieved        PT Long Term Goals - 02/27/18 1429      PT LONG TERM GOAL #3   Title  increase LE strength to 4+/5    Status  Partially Met            Plan - 02/27/18 1427    Clinical Impression Statement  Patient able to walk around the building with a SPC and light HHA, she had some increased foot drop sounds going down the hill but none going up, we did take one rest half way around.  I have been trying to get her to bear weight and stand on the affected side more to send more signals through the nerves and get her to trust it more.    PT Next Visit Plan  continue to work on the gait and may focus some on the airex standing and the single leg standing    Consulted and Agree with Plan of Care  Patient       Patient will benefit from skilled therapeutic intervention in order to improve the following deficits and impairments:  Abnormal gait, Cardiopulmonary status limiting activity, Decreased activity tolerance, Decreased balance, Decreased mobility, Decreased strength, Postural dysfunction, Improper body mechanics, Impaired flexibility, Pain, Increased muscle spasms, Decreased range of motion, Difficulty walking  Visit Diagnosis: Muscle weakness (generalized)  Acute bilateral low back pain with bilateral sciatica  Difficulty in walking, not elsewhere classified  Muscle spasm of back     Problem List Patient Active Problem List   Diagnosis Date Noted  . Osteoarthritis of right knee   . Vertigo   . DDD (degenerative disc disease), cervical   . Benign essential HTN   . History of DVT (deep vein thrombosis)   . Tachycardia   . Steroid-induced hyperglycemia   . Hypothyroidism   . Neuropathic pain   . Acute blood loss anemia   . S/P lumbar fusion   . Closed L5 vertebral fracture ([place]) 08/31/2017  . Closed fracture of fifth lumbar vertebra ([place]) 08/30/2017  . Lumbar vertebral fracture ([place]) 07/17/2017  . Scoliosis 06/13/2017     Sumner Boast., PT 02/27/2018, 2:30 PM  [place] [address] [place], [place], [address] Phone: [phone]   Fax:  [phone]  Name: [name] MRN: [id number] Date of Birth: [birth year]

## 2018-03-06 ENCOUNTER — Ambulatory Visit: Payer: Medicare Other | Attending: Internal Medicine | Admitting: Physical Therapy

## 2018-03-06 DIAGNOSIS — M5442 Lumbago with sciatica, left side: Secondary | ICD-10-CM | POA: Insufficient documentation

## 2018-03-06 DIAGNOSIS — M6281 Muscle weakness (generalized): Secondary | ICD-10-CM

## 2018-03-06 DIAGNOSIS — M5441 Lumbago with sciatica, right side: Secondary | ICD-10-CM | POA: Diagnosis present

## 2018-03-06 DIAGNOSIS — M62830 Muscle spasm of back: Secondary | ICD-10-CM | POA: Insufficient documentation

## 2018-03-06 DIAGNOSIS — R262 Difficulty in walking, not elsewhere classified: Secondary | ICD-10-CM

## 2018-03-06 NOTE — Therapy (Signed)
[facility] [place] [place], [place], [address] Phone: [phone]   Fax:  [phone]  Physical Therapy Treatment  Patient Details  Name: MCKENZE SLONE MRN: [id number] Date of Birth: 14-Jun-1932 Referring Provider (PT): Elsner   Encounter Date: 03/06/2018  PT End of Session - 03/06/18 1436    Visit Number  42    Date for PT Re-Evaluation  03/02/18    PT Start Time  4166    PT Stop Time  1436    PT Time Calculation (min)  51 min       Past Medical History:  Diagnosis Date  . Arthritis   . GERD (gastroesophageal reflux disease)   . History of blood transfusion   . Hypothyroidism   . PONV (postoperative nausea and vomiting)   . Ptosis of both eyelids   . Vertigo   . Wears glasses    reading    Past Surgical History:  Procedure Laterality Date  . ABDOMINAL HYSTERECTOMY  1986  . APPLICATION OF ROBOTIC ASSISTANCE FOR SPINAL PROCEDURE N/A 07/17/2017   Procedure: APPLICATION OF ROBOTIC ASSISTANCE FOR SPINAL PROCEDURE;  Surgeon: Kristeen Miss, MD;  Location: [facility];  Service: Neurosurgery;  Laterality: N/A;  . APPLICATION OF ROBOTIC ASSISTANCE FOR SPINAL PROCEDURE N/A 09/01/2017   Procedure: APPLICATION OF ROBOTIC ASSISTANCE FOR SPINAL PROCEDURE;  Surgeon: Kristeen Miss, MD;  Location: [facility];  Service: Neurosurgery;  Laterality: N/A;  . [place]  2000   right  . CARPAL TUNNEL RELEASE  2012   left  . COLONOSCOPY    . CYSTOCELE REPAIR  2007   sling  . DISTAL INTERPHALANGEAL JOINT FUSION Right 02/04/2014   Procedure: FUSION DISTAL INTERPHALANGEAL JOINT RIGHT INDEX FINGER ;  Surgeon: Daryll Brod, MD;  Location: [facility];  Service: Orthopedics;  Laterality: Right;  . EYE SURGERY Bilateral    Cataract surgery with lens implant  . KNEE SURGERY  2009,2011   partial knee  . OPEN REDUCTION INTERNAL FIXATION (ORIF) PROXIMAL PHALANX Left 08/30/2013    Procedure: OPEN REDUCTION INTERNAL FIXATION (ORIF) PROXIMAL PHALANX FRACTURE LEFT SMALL FINGER; SPLINT RING FINGER;  Surgeon: Wynonia Sours, MD;  Location: [facility];  Service: Orthopedics;  Laterality: Left;  . POSTERIOR LUMBAR FUSION 4 LEVEL N/A 07/17/2017   Procedure: Thoracic Ten - Lumbar Five revison of hardware with Mazor;  Surgeon: Kristeen Miss, MD;  Location: [facility];  Service: Neurosurgery;  Laterality: N/A;  Thoracic/Lumbar  . PTOSIS REPAIR Bilateral 07/27/2015   Procedure: PTOSIS REPAIR;  Surgeon: Cristine Polio, MD;  Location: [facility];  Service: Plastics;  Laterality: Bilateral;  . SHOULDER SURGERY     rt rcr,and lt  . TONSILLECTOMY      There were no vitals filed for this visit.  Subjective Assessment - 03/06/18 1426    Subjective  fell this morning- on RT side. carrying coffee mug and I think my foot hung. Pt verb amb at home most times with [facility]. Minimal bruising noted on RT lateral leg and knee    Currently in Pain?  Yes    Pain Score  3     Pain Location  Back    Pain Orientation  Lower                       [facility] Adult PT Treatment/Exercise - 03/06/18 0001  Ambulation/Gait   Gait Comments  gait with SPC level surface 400 feet LOB 1 times requiiring assistance to prevent fall- as pt fatiigues RT foot drop      High Level Balance   High Level Balance Activities  Side stepping;Backward walking    High Level Balance Comments  ball toss with on efoot up on varies obj,kicking ball, stepping on/over various obj      Lumbar Exercises: Aerobic   Recumbent Bike  level 2 x 6 minutes      Electrical Stimulation   Electrical Stimulation Location  right anterior tibialis with her in supine working on [employer]    Clinical biochemist Parameters  4 on 12 off    Electrical Stimulation Goals  Neuromuscular facilitation               PT Short Term Goals - 10/16/17 1144      PT  SHORT TERM GOAL #1   Title  independent with initial HEP    Status  Achieved        PT Long Term Goals - 03/06/18 1436      PT LONG TERM GOAL #1   Title  decrease pain 50%    Status  Achieved      PT LONG TERM GOAL #2   Title  increase Lumbar ROM 25%    Status  Achieved      PT LONG TERM GOAL #3   Title  increase LE strength to 4+/5    Status  Partially Met      PT LONG TERM GOAL #4   Status  Partially Met      PT LONG TERM GOAL #5   Title  walk 600 feet without assistive device    Status  Partially Met            Plan - 03/06/18 1437    Clinical Impression Statement  pt continues to show progress towards goals. pt now walking most of the time in home iwth SPC but did have a fall this morning, blaming on shoe. pt with increased RT LE strength but ant tib fatigues quickly and causes foot drop making pt a fall risk. pt still with great diffiuclty with SLS, full wt on RT LE and dynamic surfaces.    PT Treatment/Interventions  ADLs/Self Care Home Management;Cryotherapy;Electrical Stimulation;Moist Heat;Gait training;Functional mobility training;Therapeutic activities;Stair training;Therapeutic exercise;Balance training;Neuromuscular re-education;Manual techniques;Patient/family education    PT Next Visit Plan  continue to work on the gait and may focus some on the airex standing and the single leg standing       Patient will benefit from skilled therapeutic intervention in order to improve the following deficits and impairments:  Abnormal gait, Cardiopulmonary status limiting activity, Decreased activity tolerance, Decreased balance, Decreased mobility, Decreased strength, Postural dysfunction, Improper body mechanics, Impaired flexibility, Pain, Increased muscle spasms, Decreased range of motion, Difficulty walking  Visit Diagnosis: Muscle weakness (generalized)  Acute bilateral low back pain with bilateral sciatica  Difficulty in walking, not elsewhere  classified     Problem List Patient Active Problem List   Diagnosis Date Noted  . Osteoarthritis of right knee   . Vertigo   . DDD (degenerative disc disease), cervical   . Benign essential HTN   . History of DVT (deep vein thrombosis)   . Tachycardia   . Steroid-induced hyperglycemia   . Hypothyroidism   . Neuropathic pain   . Acute blood loss anemia   .  S/P lumbar fusion   . Closed L5 vertebral fracture ([facility]) 08/31/2017  . Closed fracture of fifth lumbar vertebra ([place]) 08/30/2017  . Lumbar vertebral fracture ([facility]) 07/17/2017  . Scoliosis 06/13/2017    Zavien Clubb,ANGIE PTA 03/06/2018, 2:39 PM  [facility] [address] [place], [place], [address] Phone: [phone]   Fax:  [phone]  Name: JAYLISE PEEK MRN: [id number] Date of Birth: 1932/12/20

## 2018-03-08 ENCOUNTER — Ambulatory Visit: Payer: Medicare Other | Admitting: Physical Therapy

## 2018-03-08 ENCOUNTER — Encounter: Payer: Self-pay | Admitting: Physical Therapy

## 2018-03-08 DIAGNOSIS — M5441 Lumbago with sciatica, right side: Secondary | ICD-10-CM

## 2018-03-08 DIAGNOSIS — M62830 Muscle spasm of back: Secondary | ICD-10-CM

## 2018-03-08 DIAGNOSIS — M6281 Muscle weakness (generalized): Secondary | ICD-10-CM

## 2018-03-08 DIAGNOSIS — R262 Difficulty in walking, not elsewhere classified: Secondary | ICD-10-CM

## 2018-03-08 DIAGNOSIS — M5442 Lumbago with sciatica, left side: Secondary | ICD-10-CM

## 2018-03-08 NOTE — Therapy (Signed)
[facility] [address] [place], [place], [address] Phone: [phone]   Fax:  [phone]  Physical Therapy Treatment  Patient Details  Name: [name] MRN: [id number] Date of Birth: 1933-03-16 Referring Provider (PT): Elsner   Encounter Date: 03/08/2018  PT End of Session - 03/08/18 1755    Visit Number  43    Date for PT Re-Evaluation  04/01/18    PT Start Time  1607    PT Stop Time  1657    PT Time Calculation (min)  50 min    Activity Tolerance  Patient tolerated treatment well    Behavior During Therapy  [facility] for tasks assessed/performed       Past Medical History:  Diagnosis Date  . Arthritis   . GERD (gastroesophageal reflux disease)   . History of blood transfusion   . Hypothyroidism   . PONV (postoperative nausea and vomiting)   . Ptosis of both eyelids   . Vertigo   . Wears glasses    reading    Past Surgical History:  Procedure Laterality Date  . ABDOMINAL HYSTERECTOMY  1986  . APPLICATION OF ROBOTIC ASSISTANCE FOR SPINAL PROCEDURE N/A 07/17/2017   Procedure: APPLICATION OF ROBOTIC ASSISTANCE FOR SPINAL PROCEDURE;  Surgeon: Kristeen Miss, MD;  Location: [facility];  Service: Neurosurgery;  Laterality: N/A;  . APPLICATION OF ROBOTIC ASSISTANCE FOR SPINAL PROCEDURE N/A 09/01/2017   Procedure: APPLICATION OF ROBOTIC ASSISTANCE FOR SPINAL PROCEDURE;  Surgeon: Kristeen Miss, MD;  Location: [facility];  Service: Neurosurgery;  Laterality: N/A;  . [place]  2000   right  . CARPAL TUNNEL RELEASE  2012   left  . COLONOSCOPY    . CYSTOCELE REPAIR  2007   sling  . DISTAL INTERPHALANGEAL JOINT FUSION Right 02/04/2014   Procedure: FUSION DISTAL INTERPHALANGEAL JOINT RIGHT INDEX FINGER ;  Surgeon: Daryll Brod, MD;  Location: [facility];  Service: Orthopedics;  Laterality: Right;  . EYE SURGERY Bilateral    Cataract surgery with lens implant  .  KNEE SURGERY  2009,2011   partial knee  . OPEN REDUCTION INTERNAL FIXATION (ORIF) PROXIMAL PHALANX Left 08/30/2013   Procedure: OPEN REDUCTION INTERNAL FIXATION (ORIF) PROXIMAL PHALANX FRACTURE LEFT SMALL FINGER; SPLINT RING FINGER;  Surgeon: Wynonia Sours, MD;  Location: [facility];  Service: Orthopedics;  Laterality: Left;  . POSTERIOR LUMBAR FUSION 4 LEVEL N/A 07/17/2017   Procedure: Thoracic Ten - Lumbar Five revison of hardware with Mazor;  Surgeon: Kristeen Miss, MD;  Location: [facility];  Service: Neurosurgery;  Laterality: N/A;  Thoracic/Lumbar  . PTOSIS REPAIR Bilateral 07/27/2015   Procedure: PTOSIS REPAIR;  Surgeon: Cristine Polio, MD;  Location: [facility];  Service: Plastics;  Laterality: Bilateral;  . SHOULDER SURGERY     rt rcr,and lt  . TONSILLECTOMY      There were no vitals filed for this visit.  Subjective Assessment - 03/08/18 1604    Subjective  Patient has bruising on the right knee from her fall, reports that she was walking wihtout an assistive deivce and holding a coffee cup    Currently in Pain?  Yes    Pain Score  2     Pain Location  Knee    Pain Orientation  Right    Aggravating Factors   the fall caused some right knee pain  [facility] Adult PT Treatment/Exercise - 03/08/18 0001      Ambulation/Gait   Gait Comments  SPC with her holding onto a weight in the right hand, just practicing her using a cane and holding onto or doing something else      High Level Balance   High Level Balance Activities  Side stepping;Backward walking;Negotitating around obstacles;Negotiating over obstacles    High Level Balance Comments  ball toss with on efoot up on varies obj,kicking ball, stepping on/over various obj, worked on some SLS      Lumbar Exercises: Aerobic   Recumbent Bike  level 2 x 6 minutes      Lumbar Exercises: Seated   Other Seated Lumbar Exercises  Bosu behind her for abs, then black tband behind  her for lumbar extension      Knee/Hip Exercises: Stretches   Gastroc Stretch  5 reps;30 seconds      Knee/Hip Exercises: Standing   Hip Abduction  Both;2 sets;10 reps    Abduction Limitations  3#               PT Short Term Goals - 10/16/17 1144      PT SHORT TERM GOAL #1   Title  independent with initial HEP    Status  Achieved        PT Long Term Goals - 03/08/18 1759      PT LONG TERM GOAL #3   Title  increase LE strength to 4+/5    Status  Partially Met            Plan - 03/08/18 1756    Clinical Impression Statement  Patient had a fall earlier in the week and it was due to something I had spoken to her about, she has asked about her safety many times and I have always said that the foot when she is tired or distracted is at risk for her to catch it and fall.  She was carrying a cup of coffee and was not using a device, I have advised her to use a cane at home and anytime outside the home to use a walker    PT Next Visit Plan  work on her balance and her ability to multitask while walking    Consulted and Agree with Plan of Care  Patient       Patient will benefit from skilled therapeutic intervention in order to improve the following deficits and impairments:  Abnormal gait, Cardiopulmonary status limiting activity, Decreased activity tolerance, Decreased balance, Decreased mobility, Decreased strength, Postural dysfunction, Improper body mechanics, Impaired flexibility, Pain, Increased muscle spasms, Decreased range of motion, Difficulty walking  Visit Diagnosis: Muscle weakness (generalized)  Acute bilateral low back pain with bilateral sciatica  Difficulty in walking, not elsewhere classified  Muscle spasm of back     Problem List Patient Active Problem List   Diagnosis Date Noted  . Osteoarthritis of right knee   . Vertigo   . DDD (degenerative disc disease), cervical   . Benign essential HTN   . History of DVT (deep vein thrombosis)   .  Tachycardia   . Steroid-induced hyperglycemia   . Hypothyroidism   . Neuropathic pain   . Acute blood loss anemia   . S/P lumbar fusion   . Closed L5 vertebral fracture ([place]) 08/31/2017  . Closed fracture of fifth lumbar vertebra ([place]) 08/30/2017  . Lumbar vertebral fracture ([facility]) 07/17/2017  . Scoliosis 06/13/2017    Sumner Boast., PT 03/08/2018, 6:00 PM  [place], [place], [address] Phone: [phone]   Fax:  [phone]  Name: [name] MRN: [id number] Date of Birth: [birth year]

## 2018-03-13 ENCOUNTER — Ambulatory Visit: Payer: Medicare Other | Admitting: Physical Therapy

## 2018-03-13 ENCOUNTER — Encounter: Payer: Self-pay | Admitting: Physical Therapy

## 2018-03-13 DIAGNOSIS — M5441 Lumbago with sciatica, right side: Secondary | ICD-10-CM

## 2018-03-13 DIAGNOSIS — R262 Difficulty in walking, not elsewhere classified: Secondary | ICD-10-CM

## 2018-03-13 DIAGNOSIS — M5442 Lumbago with sciatica, left side: Secondary | ICD-10-CM

## 2018-03-13 DIAGNOSIS — M6281 Muscle weakness (generalized): Secondary | ICD-10-CM | POA: Diagnosis not present

## 2018-03-13 NOTE — Therapy (Signed)
[place], [place], [address] Phone: [phone]   Fax:  [phone]  Physical Therapy Treatment  Patient Details  Name: [name] MRN: [id number] Date of Birth: 23-Apr-1932 Referring Provider (PT): Elsner   Encounter Date: 03/13/2018  PT End of Session - 03/13/18 1624    Visit Number  17    Date for PT Re-Evaluation  04/01/18    PT Start Time  2263    PT Stop Time  1433    PT Time Calculation (min)  45 min    Activity Tolerance  Patient tolerated treatment well    Behavior During Therapy  [facility] for tasks assessed/performed       Past Medical History:  Diagnosis Date  . Arthritis   . GERD (gastroesophageal reflux disease)   . History of blood transfusion   . Hypothyroidism   . PONV (postoperative nausea and vomiting)   . Ptosis of both eyelids   . Vertigo   . Wears glasses    reading    Past Surgical History:  Procedure Laterality Date  . ABDOMINAL HYSTERECTOMY  1986  . APPLICATION OF ROBOTIC ASSISTANCE FOR SPINAL PROCEDURE N/A 07/17/2017   Procedure: APPLICATION OF ROBOTIC ASSISTANCE FOR SPINAL PROCEDURE;  Surgeon: Kristeen Miss, MD;  Location: [facility];  Service: Neurosurgery;  Laterality: N/A;  . APPLICATION OF ROBOTIC ASSISTANCE FOR SPINAL PROCEDURE N/A 09/01/2017   Procedure: APPLICATION OF ROBOTIC ASSISTANCE FOR SPINAL PROCEDURE;  Surgeon: Kristeen Miss, MD;  Location: [facility];  Service: Neurosurgery;  Laterality: N/A;  . [place]  2000   right  . CARPAL TUNNEL RELEASE  2012   left  . COLONOSCOPY    . CYSTOCELE REPAIR  2007   sling  . DISTAL INTERPHALANGEAL JOINT FUSION Right 02/04/2014   Procedure: FUSION DISTAL INTERPHALANGEAL JOINT RIGHT INDEX FINGER ;  Surgeon: Daryll Brod, MD;  Location: [facility];  Service: Orthopedics;  Laterality: Right;  . EYE SURGERY Bilateral    Cataract surgery with lens implant  .  KNEE SURGERY  2009,2011   partial knee  . OPEN REDUCTION INTERNAL FIXATION (ORIF) PROXIMAL PHALANX Left 08/30/2013   Procedure: OPEN REDUCTION INTERNAL FIXATION (ORIF) PROXIMAL PHALANX FRACTURE LEFT SMALL FINGER; SPLINT RING FINGER;  Surgeon: Wynonia Sours, MD;  Location: [facility];  Service: Orthopedics;  Laterality: Left;  . POSTERIOR LUMBAR FUSION 4 LEVEL N/A 07/17/2017   Procedure: Thoracic Ten - Lumbar Five revison of hardware with Mazor;  Surgeon: Kristeen Miss, MD;  Location: [facility];  Service: Neurosurgery;  Laterality: N/A;  Thoracic/Lumbar  . PTOSIS REPAIR Bilateral 07/27/2015   Procedure: PTOSIS REPAIR;  Surgeon: Cristine Polio, MD;  Location: [facility];  Service: Plastics;  Laterality: Bilateral;  . SHOULDER SURGERY     rt rcr,and lt  . TONSILLECTOMY      There were no vitals filed for this visit.   Subjective Assessment - 03/13/18 1421    Subjective  I have been more fearful and careful since I fell    Currently in Pain?  Yes    Pain Score  2     Pain Location  Knee    Pain Orientation  Right                    Objective measurements completed on examination: See above findings.      [facility]  Adult PT Treatment/Exercise - 03/13/18 0001      Ambulation/Gait   Gait Comments  SPC with her holding onto a weight in the right hand, just practicing her using a cane and holding onto or doing something else      High Level Balance   High Level Balance Activities  Side stepping;Backward walking;Negotitating around obstacles;Negotiating over obstacles      Lumbar Exercises: Aerobic   Recumbent Bike  level 2 x 6 minutes      Lumbar Exercises: Machines for Strengthening   Cybex Knee Extension  5# right only 3x8    Cybex Knee Flexion  right only 20# 3x10, cues needed to get better flexion      Lumbar Exercises: Seated   Other Seated Lumbar Exercises  weighted ball overhead reach, holding at arms length and then trunk rotation for core       Lumbar Exercises: Supine   Other Supine Lumbar Exercises  multiple ways of bridging, all different ways to get more mm recruitment in the right leg, left leg elevated, left leg on a small ball.    Other Supine Lumbar Exercises  feet on ball K2C, trunk rotation, bridges and abdominals, DF for ankle especially rihgt, green tband ankle PF, bridge with green tband clamshells      Knee/Hip Exercises: Stretches   Gastroc Stretch  4 reps;20 seconds      Knee/Hip Exercises: Standing   Hip Abduction  Both;2 sets;10 reps    Abduction Limitations  3#               PT Short Term Goals - 10/16/17 1144      PT SHORT TERM GOAL #1   Title  independent with initial HEP    Status  Achieved        PT Long Term Goals - 03/08/18 1759      PT LONG TERM GOAL #3   Title  increase LE strength to 4+/5    Status  Partially Met             Plan - 03/13/18 1625    Clinical Impression Statement  Patient still with some fear and hesitation with walking secondary to the fall that was recent.  She is now really hesitant with how she is walking    PT Next Visit Plan  work on her balance and her ability to multitask while walking    Consulted and Agree with Plan of Care  Patient       Patient will benefit from skilled therapeutic intervention in order to improve the following deficits and impairments:  Abnormal gait, Cardiopulmonary status limiting activity, Decreased activity tolerance, Decreased balance, Decreased mobility, Decreased strength, Postural dysfunction, Improper body mechanics, Impaired flexibility, Pain, Increased muscle spasms, Decreased range of motion, Difficulty walking  Visit Diagnosis: Muscle weakness (generalized)  Acute bilateral low back pain with bilateral sciatica  Difficulty in walking, not elsewhere classified     Problem List Patient Active Problem List   Diagnosis Date Noted  . Osteoarthritis of right knee   . Vertigo   . DDD (degenerative disc  disease), cervical   . Benign essential HTN   . History of DVT (deep vein thrombosis)   . Tachycardia   . Steroid-induced hyperglycemia   . Hypothyroidism   . Neuropathic pain   . Acute blood loss anemia   . S/P lumbar fusion   . Closed L5 vertebral fracture ([place]) 08/31/2017  . Closed fracture of fifth lumbar vertebra ([place]) 08/30/2017  .  Lumbar vertebral fracture ([place]) 07/17/2017  . Scoliosis 06/13/2017    Sumner Boast., PT 03/13/2018, 4:26 PM  [place] [address] [place], [place], [address] Phone: [phone]   Fax:  [phone]  Name: DESTINIE THORNSBERRY MRN: [id number] Date of Birth: 1932-05-31

## 2018-03-15 ENCOUNTER — Encounter: Payer: Self-pay | Admitting: Physical Therapy

## 2018-03-15 ENCOUNTER — Ambulatory Visit: Payer: Medicare Other | Admitting: Physical Therapy

## 2018-03-15 DIAGNOSIS — M6281 Muscle weakness (generalized): Secondary | ICD-10-CM

## 2018-03-15 DIAGNOSIS — M62830 Muscle spasm of back: Secondary | ICD-10-CM

## 2018-03-15 DIAGNOSIS — R262 Difficulty in walking, not elsewhere classified: Secondary | ICD-10-CM

## 2018-03-15 DIAGNOSIS — M5441 Lumbago with sciatica, right side: Secondary | ICD-10-CM

## 2018-03-15 DIAGNOSIS — M5442 Lumbago with sciatica, left side: Secondary | ICD-10-CM

## 2018-03-15 NOTE — Therapy (Signed)
[address] [place], [place], [address] Phone: [phone]   Fax:  [phone]  Physical Therapy Treatment  Patient Details  Name: [name] MRN: [id number] Date of Birth: 1933-01-15 Referring Provider (PT): Elsner   Encounter Date: 03/15/2018  PT End of Session - 03/15/18 1505    Visit Number  45    Date for PT Re-Evaluation  04/01/18    PT Start Time  1350    PT Stop Time  1440    PT Time Calculation (min)  50 min    Activity Tolerance  Patient tolerated treatment well    Behavior During Therapy  [facility] for tasks assessed/performed       Past Medical History:  Diagnosis Date  . Arthritis   . GERD (gastroesophageal reflux disease)   . History of blood transfusion   . Hypothyroidism   . PONV (postoperative nausea and vomiting)   . Ptosis of both eyelids   . Vertigo   . Wears glasses    reading    Past Surgical History:  Procedure Laterality Date  . ABDOMINAL HYSTERECTOMY  1986  . APPLICATION OF ROBOTIC ASSISTANCE FOR SPINAL PROCEDURE N/A 07/17/2017   Procedure: APPLICATION OF ROBOTIC ASSISTANCE FOR SPINAL PROCEDURE;  Surgeon: Kristeen Miss, MD;  Location: [place];  Service: Neurosurgery;  Laterality: N/A;  . APPLICATION OF ROBOTIC ASSISTANCE FOR SPINAL PROCEDURE N/A 09/01/2017   Procedure: APPLICATION OF ROBOTIC ASSISTANCE FOR SPINAL PROCEDURE;  Surgeon: Kristeen Miss, MD;  Location: [place];  Service: Neurosurgery;  Laterality: N/A;  . [place]  2000   right  . CARPAL TUNNEL RELEASE  2012   left  . COLONOSCOPY    . CYSTOCELE REPAIR  2007   sling  . DISTAL INTERPHALANGEAL JOINT FUSION Right 02/04/2014   Procedure: FUSION DISTAL INTERPHALANGEAL JOINT RIGHT INDEX FINGER ;  Surgeon: Daryll Brod, MD;  Location: [place];  Service: Orthopedics;  Laterality: Right;  . EYE SURGERY Bilateral    Cataract surgery with lens implant  .  KNEE SURGERY  2009,2011   partial knee  . OPEN REDUCTION INTERNAL FIXATION (ORIF) PROXIMAL PHALANX Left 08/30/2013   Procedure: OPEN REDUCTION INTERNAL FIXATION (ORIF) PROXIMAL PHALANX FRACTURE LEFT SMALL FINGER; SPLINT RING FINGER;  Surgeon: Wynonia Sours, MD;  Location: [place];  Service: Orthopedics;  Laterality: Left;  . POSTERIOR LUMBAR FUSION 4 LEVEL N/A 07/17/2017   Procedure: Thoracic Ten - Lumbar Five revison of hardware with Mazor;  Surgeon: Kristeen Miss, MD;  Location: [place];  Service: Neurosurgery;  Laterality: N/A;  Thoracic/Lumbar  . PTOSIS REPAIR Bilateral 07/27/2015   Procedure: PTOSIS REPAIR;  Surgeon: Cristine Polio, MD;  Location: [place];  Service: Plastics;  Laterality: Bilateral;  . SHOULDER SURGERY     rt rcr,and lt  . TONSILLECTOMY      There were no vitals filed for this visit.  Subjective Assessment - 03/15/18 1437    Subjective  Patient continues to report that she is more careful with her walking, reports that she is trying to go back to feeling better but fear is limiting her, she is now going back to trying to use only a cane in the home but reports that she is also using walls and furniture    Currently in Pain?  No/denies  [facility] Adult PT Treatment/Exercise - 03/15/18 0001      Ambulation/Gait   Gait Comments  gait in the clinic and 175'x3 with SPC on level surfaces, stairs one flight down one at a time, then going up step over step with HHA and handrail      High Level Balance   High Level Balance Activities  Side stepping;Backward walking;Direction changes    High Level Balance Comments  toe touches on 8" step, on a foam rll and then on a ball, standing on airex, reaching, tried head turns she really has a hard time with this      Lumbar Exercises: Aerobic   Recumbent Bike  level 2 x 6 minutes      Lumbar Exercises: Machines for Strengthening   Other Lumbar Machine Exercise   seated row and lats 35# 3x10 each    Other Lumbar Machine Exercise  10# standing good posture straight arm pulls      Lumbar Exercises: Seated   Sit to Stand  20 reps    Sit to Stand Limitations  no UE    Other Seated Lumbar Exercises  black tband lumbar extension      Lumbar Exercises: Supine   Other Supine Lumbar Exercises  multiple ways of bridging, all different ways to get more mm recruitment in the right leg, left leg elevated, left leg on a small ball.               PT Short Term Goals - 10/16/17 1144      PT SHORT TERM GOAL #1   Title  independent with initial HEP    Status  Achieved        PT Long Term Goals - 03/15/18 1508      PT LONG TERM GOAL #5   Title  walk 600 feet without assistive device    Status  Partially Met            Plan - 03/15/18 1505    Clinical Impression Statement  The fall she had has caused a set back due to fear.  I did speak with her again about AFO, I am still hesitant about this as she has some DF actively and do not want to use the brace and then she lose the active motion.  I may speak with her MD about this when she sees him next.  She really struggles with the airex, does not like it and loses balance with any head turns    PT Next Visit Plan  work on her balance and her ability to multitask while walking    Consulted and Agree with Plan of Care  Patient       Patient will benefit from skilled therapeutic intervention in order to improve the following deficits and impairments:  Abnormal gait, Cardiopulmonary status limiting activity, Decreased activity tolerance, Decreased balance, Decreased mobility, Decreased strength, Postural dysfunction, Improper body mechanics, Impaired flexibility, Pain, Increased muscle spasms, Decreased range of motion, Difficulty walking  Visit Diagnosis: Muscle weakness (generalized)  Acute bilateral low back pain with bilateral sciatica  Difficulty in walking, not elsewhere  classified  Muscle spasm of back     Problem List Patient Active Problem List   Diagnosis Date Noted  . Osteoarthritis of right knee   . Vertigo   . DDD (degenerative disc disease), cervical   . Benign essential HTN   . History of DVT (deep vein thrombosis)   . Tachycardia   . Steroid-induced hyperglycemia   .  Hypothyroidism   . Neuropathic pain   . Acute blood loss anemia   . S/P lumbar fusion   . Closed L5 vertebral fracture ([place]) 08/31/2017  . Closed fracture of fifth lumbar vertebra ([place]) 08/30/2017  . Lumbar vertebral fracture ([place]) 07/17/2017  . Scoliosis 06/13/2017    Sumner Boast., PT 03/15/2018, 3:09 PM  [address] [place], [place], [address] Phone: [phone]   Fax:  [phone]  Name: BRITAIN ANAGNOS MRN: [id number] Date of Birth: 1932/11/06

## 2018-03-20 ENCOUNTER — Ambulatory Visit: Payer: Medicare Other | Admitting: Physical Therapy

## 2018-03-20 ENCOUNTER — Encounter: Payer: Self-pay | Admitting: Physical Therapy

## 2018-03-20 DIAGNOSIS — M5442 Lumbago with sciatica, left side: Secondary | ICD-10-CM

## 2018-03-20 DIAGNOSIS — R262 Difficulty in walking, not elsewhere classified: Secondary | ICD-10-CM

## 2018-03-20 DIAGNOSIS — M62830 Muscle spasm of back: Secondary | ICD-10-CM

## 2018-03-20 DIAGNOSIS — M6281 Muscle weakness (generalized): Secondary | ICD-10-CM

## 2018-03-20 DIAGNOSIS — M5441 Lumbago with sciatica, right side: Secondary | ICD-10-CM

## 2018-03-20 NOTE — Therapy (Signed)
[address] [place], [place], [address] Phone: [phone]   Fax:  [phone]  Physical Therapy Treatment  Patient Details  Name: [name] MRN: [id number] Date of Birth: 1932-12-13 Referring Provider (PT): Elsner   Encounter Date: 03/20/2018  PT End of Session - 03/20/18 1437    Visit Number  73    Date for PT Re-Evaluation  04/01/18    PT Start Time  1353    PT Stop Time  1443    PT Time Calculation (min)  50 min    Activity Tolerance  Patient tolerated treatment well    Behavior During Therapy  [facility] for tasks assessed/performed       Past Medical History:  Diagnosis Date  . Arthritis   . GERD (gastroesophageal reflux disease)   . History of blood transfusion   . Hypothyroidism   . PONV (postoperative nausea and vomiting)   . Ptosis of both eyelids   . Vertigo   . Wears glasses    reading    Past Surgical History:  Procedure Laterality Date  . ABDOMINAL HYSTERECTOMY  1986  . APPLICATION OF ROBOTIC ASSISTANCE FOR SPINAL PROCEDURE N/A 07/17/2017   Procedure: APPLICATION OF ROBOTIC ASSISTANCE FOR SPINAL PROCEDURE;  Surgeon: Kristeen Miss, MD;  Location: [place];  Service: Neurosurgery;  Laterality: N/A;  . APPLICATION OF ROBOTIC ASSISTANCE FOR SPINAL PROCEDURE N/A 09/01/2017   Procedure: APPLICATION OF ROBOTIC ASSISTANCE FOR SPINAL PROCEDURE;  Surgeon: Kristeen Miss, MD;  Location: [place];  Service: Neurosurgery;  Laterality: N/A;  . [place]  2000   right  . CARPAL TUNNEL RELEASE  2012   left  . COLONOSCOPY    . CYSTOCELE REPAIR  2007   sling  . DISTAL INTERPHALANGEAL JOINT FUSION Right 02/04/2014   Procedure: FUSION DISTAL INTERPHALANGEAL JOINT RIGHT INDEX FINGER ;  Surgeon: Daryll Brod, MD;  Location: [place];  Service: Orthopedics;  Laterality: Right;  . EYE SURGERY Bilateral    Cataract surgery with lens implant  .  KNEE SURGERY  2009,2011   partial knee  . OPEN REDUCTION INTERNAL FIXATION (ORIF) PROXIMAL PHALANX Left 08/30/2013   Procedure: OPEN REDUCTION INTERNAL FIXATION (ORIF) PROXIMAL PHALANX FRACTURE LEFT SMALL FINGER; SPLINT RING FINGER;  Surgeon: Wynonia Sours, MD;  Location: [facility];  Service: Orthopedics;  Laterality: Left;  . POSTERIOR LUMBAR FUSION 4 LEVEL N/A 07/17/2017   Procedure: Thoracic Ten - Lumbar Five revison of hardware with Mazor;  Surgeon: Kristeen Miss, MD;  Location: [place];  Service: Neurosurgery;  Laterality: N/A;  Thoracic/Lumbar  . PTOSIS REPAIR Bilateral 07/27/2015   Procedure: PTOSIS REPAIR;  Surgeon: Cristine Polio, MD;  Location: [place];  Service: Plastics;  Laterality: Bilateral;  . SHOULDER SURGERY     rt rcr,and lt  . TONSILLECTOMY      There were no vitals filed for this visit.  Subjective Assessment - 03/20/18 1358    Subjective  Reports that she is feeling good today.  Less shaky    Currently in Pain?  No/denies                       [facility] Adult PT Treatment/Exercise - 03/20/18 0001      Ambulation/Gait   Gait Comments  gait in the clinic and 175'x3 with SPC on level surfaces, stairs one flight down one  at a time, then going up step over step with HHA and handrail, gait in the hall no device close SBA 2 x 60 feet, reports fear      High Level Balance   High Level Balance Activities  Side stepping;Backward walking;Direction changes    High Level Balance Comments  SLS, SLS on airex      Lumbar Exercises: Aerobic   Recumbent Bike  level 2 x 6 minutes      Lumbar Exercises: Machines for Strengthening   Other Lumbar Machine Exercise  seated row and lats 35# 3x10 each      Lumbar Exercises: Standing   Other Standing Lumbar Exercises  5# hip flexion and abduction    Other Standing Lumbar Exercises  heel raises and toe raises      Lumbar Exercises: Seated   Other Seated Lumbar Exercises  black tband lumbar  extension      Electrical Stimulation   Electrical Stimulation Location  right anterior tibialis with her in supine working on [employer]    Conservator, museum/gallery Goals  Neuromuscular facilitation               PT Short Term Goals - 10/16/17 1144      PT SHORT TERM GOAL #1   Title  independent with initial HEP    Status  Achieved        PT Long Term Goals - 03/20/18 1442      PT LONG TERM GOAL #1   Title  decrease pain 50%    Status  Achieved      PT LONG TERM GOAL #2   Title  increase Lumbar ROM 25%    Status  Achieved      PT LONG TERM GOAL #3   Title  increase LE strength to 4+/5    Status  Partially Met      PT LONG TERM GOAL #4   Title  go up and down stairs step over step    Status  Partially Met      PT LONG TERM GOAL #5   Title  walk 600 feet without assistive device    Status  Partially Met            Plan - 03/20/18 1438    Clinical Impression Statement  Since the last MD visit patient has made great gains in her ability to walk and be more independent, she did have one fall recently when she was trying to walk while carrying a cup of coffee.  Her right anterior tib has been up and down, some days she can DF against gravity to neutral and some days she has trouble getting to neutral supine where gravity has less affect..  I have continued to ask her to be safe with gait and recommended the walker.  We are mostly using the [facility] here and she is starting to use that at home, we have also worked on gait without device, she no longer has as much forward trunk lean as she fatigues, she does still get some right foot flop with fatigue.  I have debated about getting an AFO for her to decrease the foot drop with walking, but I hesitate to do this since there are times she has the motion.    PT Next Visit Plan  please advise if you would like [place] to continue, change plan, get AFO  Consulted and Agree with Plan of Care  Patient       Patient will benefit from skilled therapeutic intervention in order to improve the following deficits and impairments:  Abnormal gait, Cardiopulmonary status limiting activity, Decreased activity tolerance, Decreased balance, Decreased mobility, Decreased strength, Postural dysfunction, Improper body mechanics, Impaired flexibility, Pain, Increased muscle spasms, Decreased range of motion, Difficulty walking  Visit Diagnosis: Muscle weakness (generalized)  Acute bilateral low back pain with bilateral sciatica  Difficulty in walking, not elsewhere classified  Muscle spasm of back     Problem List Patient Active Problem List   Diagnosis Date Noted  . Osteoarthritis of right knee   . Vertigo   . DDD (degenerative disc disease), cervical   . Benign essential HTN   . History of DVT (deep vein thrombosis)   . Tachycardia   . Steroid-induced hyperglycemia   . Hypothyroidism   . Neuropathic pain   . Acute blood loss anemia   . S/P lumbar fusion   . Closed L5 vertebral fracture ([place]) 08/31/2017  . Closed fracture of fifth lumbar vertebra ([place]) 08/30/2017  . Lumbar vertebral fracture ([place]) 07/17/2017  . Scoliosis 06/13/2017    Sumner Boast., PT 03/20/2018, 2:45 PM  [address] [place], [place], [address] Phone: [phone]   Fax:  [phone]  Name: [name] MRN: [id number] Date of Birth: 1932-09-06

## 2018-03-22 ENCOUNTER — Ambulatory Visit: Payer: Medicare Other | Admitting: Physical Therapy

## 2018-03-22 ENCOUNTER — Encounter: Payer: Self-pay | Admitting: Physical Therapy

## 2018-03-22 DIAGNOSIS — M6281 Muscle weakness (generalized): Secondary | ICD-10-CM

## 2018-03-22 DIAGNOSIS — M5442 Lumbago with sciatica, left side: Secondary | ICD-10-CM

## 2018-03-22 DIAGNOSIS — M5441 Lumbago with sciatica, right side: Secondary | ICD-10-CM

## 2018-03-22 DIAGNOSIS — M62830 Muscle spasm of back: Secondary | ICD-10-CM

## 2018-03-22 DIAGNOSIS — R262 Difficulty in walking, not elsewhere classified: Secondary | ICD-10-CM

## 2018-03-22 NOTE — Therapy (Signed)
[place] [place], [place], [address] Phone: [phone]   Fax:  [phone]  Physical Therapy Treatment  Patient Details  Name: [name] MRN: [id number] Date of Birth: 05/13/1932 Referring Provider (PT): Elsner   Encounter Date: 03/22/2018  PT End of Session - 03/22/18 1608    Visit Number  39    Date for PT Re-Evaluation  04/01/18    PT Start Time  1350    PT Stop Time  1438    PT Time Calculation (min)  48 min    Activity Tolerance  Patient tolerated treatment well    Behavior During Therapy  [facility] for tasks assessed/performed       Past Medical History:  Diagnosis Date  . Arthritis   . GERD (gastroesophageal reflux disease)   . History of blood transfusion   . Hypothyroidism   . PONV (postoperative nausea and vomiting)   . Ptosis of both eyelids   . Vertigo   . Wears glasses    reading    Past Surgical History:  Procedure Laterality Date  . ABDOMINAL HYSTERECTOMY  1986  . APPLICATION OF ROBOTIC ASSISTANCE FOR SPINAL PROCEDURE N/A 07/17/2017   Procedure: APPLICATION OF ROBOTIC ASSISTANCE FOR SPINAL PROCEDURE;  Surgeon: Kristeen Miss, MD;  Location: [facility];  Service: Neurosurgery;  Laterality: N/A;  . APPLICATION OF ROBOTIC ASSISTANCE FOR SPINAL PROCEDURE N/A 09/01/2017   Procedure: APPLICATION OF ROBOTIC ASSISTANCE FOR SPINAL PROCEDURE;  Surgeon: Kristeen Miss, MD;  Location: [place];  Service: Neurosurgery;  Laterality: N/A;  . [place]  2000   right  . CARPAL TUNNEL RELEASE  2012   left  . COLONOSCOPY    . CYSTOCELE REPAIR  2007   sling  . DISTAL INTERPHALANGEAL JOINT FUSION Right 02/04/2014   Procedure: FUSION DISTAL INTERPHALANGEAL JOINT RIGHT INDEX FINGER ;  Surgeon: Daryll Brod, MD;  Location: [facility];  Service: Orthopedics;  Laterality: Right;  . EYE SURGERY Bilateral    Cataract surgery with lens implant  .  KNEE SURGERY  2009,2011   partial knee  . OPEN REDUCTION INTERNAL FIXATION (ORIF) PROXIMAL PHALANX Left 08/30/2013   Procedure: OPEN REDUCTION INTERNAL FIXATION (ORIF) PROXIMAL PHALANX FRACTURE LEFT SMALL FINGER; SPLINT RING FINGER;  Surgeon: Wynonia Sours, MD;  Location: [place];  Service: Orthopedics;  Laterality: Left;  . POSTERIOR LUMBAR FUSION 4 LEVEL N/A 07/17/2017   Procedure: Thoracic Ten - Lumbar Five revison of hardware with Mazor;  Surgeon: Kristeen Miss, MD;  Location: [place];  Service: Neurosurgery;  Laterality: N/A;  Thoracic/Lumbar  . PTOSIS REPAIR Bilateral 07/27/2015   Procedure: PTOSIS REPAIR;  Surgeon: Cristine Polio, MD;  Location: [place];  Service: Plastics;  Laterality: Bilateral;  . SHOULDER SURGERY     rt rcr,and lt  . TONSILLECTOMY      There were no vitals filed for this visit.  Subjective Assessment - 03/22/18 1358    Subjective  saw MD yesterday, he wanted to not do the AFO as he thinks it will continue to improve    Currently in Pain?  No/denies                       [facility] Adult PT Treatment/Exercise - 03/22/18 0001      Ambulation/Gait   Gait Comments  all gait in the clinic SPC, SBA working  on distance without foot flop and holding posture, worked some on no deivce in clinic x 20'x2      High Level Balance   High Level Balance Activities  Side stepping;Backward walking;Direction changes    High Level Balance Comments  SLS, SLS on airex, marching on airex going slow      Lumbar Exercises: Aerobic   Recumbent Bike  level 2 x 6 minutes      Lumbar Exercises: Machines for Strengthening   Other Lumbar Machine Exercise  seated row and lats 35# 3x10 each    Other Lumbar Machine Exercise  10# standing good posture straight arm pulls      Lumbar Exercises: Standing   Other Standing Lumbar Exercises  5# hip flexion and abduction, extension      Lumbar Exercises: Seated   Sit to Stand  20 reps    Sit to  Stand Limitations  no UE    Other Seated Lumbar Exercises  Bosu behind her for abs, then black tband behind her for lumbar extension               PT Short Term Goals - 10/16/17 1144      PT SHORT TERM GOAL #1   Title  independent with initial HEP    Status  Achieved        PT Long Term Goals - 03/20/18 1442      PT LONG TERM GOAL #1   Title  decrease pain 50%    Status  Achieved      PT LONG TERM GOAL #2   Title  increase Lumbar ROM 25%    Status  Achieved      PT LONG TERM GOAL #3   Title  increase LE strength to 4+/5    Status  Partially Met      PT LONG TERM GOAL #4   Title  go up and down stairs step over step    Status  Partially Met      PT LONG TERM GOAL #5   Title  walk 600 feet without assistive device    Status  Partially Met            Plan - 03/22/18 1609    Clinical Impression Statement  Patient saw the MD, he is pleased, he felt like he wanted to not get the AFO since she does have some motions and feels it may improve.      PT Next Visit Plan  will continue to work on her safety and function    Consulted and Agree with Plan of Care  Patient       Patient will benefit from skilled therapeutic intervention in order to improve the following deficits and impairments:  Abnormal gait, Cardiopulmonary status limiting activity, Decreased activity tolerance, Decreased balance, Decreased mobility, Decreased strength, Postural dysfunction, Improper body mechanics, Impaired flexibility, Pain, Increased muscle spasms, Decreased range of motion, Difficulty walking  Visit Diagnosis: Muscle weakness (generalized)  Acute bilateral low back pain with bilateral sciatica  Difficulty in walking, not elsewhere classified  Muscle spasm of back     Problem List Patient Active Problem List   Diagnosis Date Noted  . Osteoarthritis of right knee   . Vertigo   . DDD (degenerative disc disease), cervical   . Benign essential HTN   . History of DVT  (deep vein thrombosis)   . Tachycardia   . Steroid-induced hyperglycemia   . Hypothyroidism   . Neuropathic pain   .  Acute blood loss anemia   . S/P lumbar fusion   . Closed L5 vertebral fracture ([place]) 08/31/2017  . Closed fracture of fifth lumbar vertebra ([place]) 08/30/2017  . Lumbar vertebral fracture ([place]) 07/17/2017  . Scoliosis 06/13/2017    Sumner Boast., PT 03/22/2018, 4:10 PM  [address] [place], [place], [address] Phone: [phone]   Fax:  [phone]  Name: [name] MRN: [id number] Date of Birth: [birth year]

## 2018-03-27 ENCOUNTER — Encounter: Payer: Self-pay | Admitting: Physical Therapy

## 2018-03-27 ENCOUNTER — Ambulatory Visit: Payer: Medicare Other | Admitting: Physical Therapy

## 2018-03-27 DIAGNOSIS — R262 Difficulty in walking, not elsewhere classified: Secondary | ICD-10-CM

## 2018-03-27 DIAGNOSIS — M6281 Muscle weakness (generalized): Secondary | ICD-10-CM

## 2018-03-27 DIAGNOSIS — M5442 Lumbago with sciatica, left side: Secondary | ICD-10-CM

## 2018-03-27 DIAGNOSIS — M62830 Muscle spasm of back: Secondary | ICD-10-CM

## 2018-03-27 DIAGNOSIS — M5441 Lumbago with sciatica, right side: Secondary | ICD-10-CM

## 2018-03-27 NOTE — Therapy (Signed)
[facility] [address] [place], [place], [address] Phone: [phone]   Fax:  [phone]  Physical Therapy Treatment  Patient Details  Name: [name] MRN: [id number] Date of Birth: 08/13/1932 Referring Provider (PT): Elsner   Encounter Date: 03/27/2018  PT End of Session - 03/27/18 1516    Visit Number  48    Date for PT Re-Evaluation  04/01/18    PT Start Time  1350    PT Stop Time  1435    PT Time Calculation (min)  45 min    Activity Tolerance  Patient tolerated treatment well    Behavior During Therapy  WFL for tasks assessed/performed       Past Medical History:  Diagnosis Date  . Arthritis   . GERD (gastroesophageal reflux disease)   . History of blood transfusion   . Hypothyroidism   . PONV (postoperative nausea and vomiting)   . Ptosis of both eyelids   . Vertigo   . Wears glasses    reading    Past Surgical History:  Procedure Laterality Date  . ABDOMINAL HYSTERECTOMY  1986  . APPLICATION OF ROBOTIC ASSISTANCE FOR SPINAL PROCEDURE N/A 07/17/2017   Procedure: APPLICATION OF ROBOTIC ASSISTANCE FOR SPINAL PROCEDURE;  Surgeon: Elsner, Henry, MD;  Location: [facility];  Service: Neurosurgery;  Laterality: N/A;  . APPLICATION OF ROBOTIC ASSISTANCE FOR SPINAL PROCEDURE N/A 09/01/2017   Procedure: APPLICATION OF ROBOTIC ASSISTANCE FOR SPINAL PROCEDURE;  Surgeon: Elsner, Henry, MD;  Location: [facility];  Service: Neurosurgery;  Laterality: N/A;  . BACK SURGERY  1986   lumb lam  . CARPAL TUNNEL RELEASE  2000   right  . CARPAL TUNNEL RELEASE  2012   left  . COLONOSCOPY    . CYSTOCELE REPAIR  2007   sling  . DISTAL INTERPHALANGEAL JOINT FUSION Right 02/04/2014   Procedure: FUSION DISTAL INTERPHALANGEAL JOINT RIGHT INDEX FINGER ;  Surgeon: Gary Kuzma, MD;  Location: [facility];  Service: Orthopedics;  Laterality: Right;  . EYE SURGERY Bilateral    Cataract surgery with lens implant  .  KNEE SURGERY  2009,2011   partial knee  . OPEN REDUCTION INTERNAL FIXATION (ORIF) PROXIMAL PHALANX Left 08/30/2013   Procedure: OPEN REDUCTION INTERNAL FIXATION (ORIF) PROXIMAL PHALANX FRACTURE LEFT SMALL FINGER; SPLINT RING FINGER;  Surgeon: Gary R Kuzma, MD;  Location: [facility];  Service: Orthopedics;  Laterality: Left;  . POSTERIOR LUMBAR FUSION 4 LEVEL N/A 07/17/2017   Procedure: Thoracic Ten - Lumbar Five revison of hardware with Mazor;  Surgeon: Elsner, Henry, MD;  [facility];  Service: Neurosurgery;  Laterality: N/A;  Thoracic/Lumbar  . PTOSIS REPAIR Bilateral 07/27/2015   Procedure: PTOSIS REPAIR;  Surgeon: Gerald Truesdale, MD;  Location: [facility];  Service: Plastics;  Laterality: Bilateral;  . SHOULDER SURGERY     rt rcr,and lt  . TONSILLECTOMY      There were no vitals filed for this visit.  Subjective Assessment - 03/27/18 1409    Subjective  Feeling pretty good, stronger    Currently in Pain?  No/denies         [facility] PT Assessment - 03/27/18 0001      Ambulation/Gait   Gait Comments  gait outside around the parking island with SPC and close supervision      Timed Up and Go Test   Normal TUG (seconds)  23   with SPC                    [place] [facility] Adult PT Treatment/Exercise - 03/27/18 0001      High Level Balance   High Level Balance Comments  standing on airex red tband scapular stabilization, resisted gait with the pulley system 30# this was very difficult for her right leg, SLS       Lumbar Exercises: Aerobic   Recumbent Bike  level 2 x 6 minutes      Lumbar Exercises: Machines for Strengthening   Other Lumbar Machine Exercise  seated row and lats 35# 3x10 each    Other Lumbar Machine Exercise  10# standing good posture straight arm pulls, 5# hip extension 2x10, hip abduction x10, right leg very difficult      Lumbar Exercises: Standing   Other Standing Lumbar Exercises  calf stretches and then heel raises       Lumbar Exercises: Seated   Sit to Stand  20 reps    Sit to Stand Limitations  no UE    Other Seated Lumbar Exercises  Bosu behind her for abs, then black tband behind her for lumbar extension               PT Short Term Goals - 10/16/17 1144      PT SHORT TERM GOAL #1   Title  independent with initial HEP    Status  Achieved        PT Long Term Goals - 03/27/18 1518      PT LONG TERM GOAL #3   Title  increase LE strength to 4+/5    Status  Partially Met      PT LONG TERM GOAL #4   Title  go up and down stairs step over step    Status  Partially Met      PT LONG TERM GOAL #5   Title  walk 600 feet without assistive device    Status  Partially Met            Plan - 03/27/18 1516    Clinical Impression Statement  TRied some more advanced exercises today, this did cause the right foot to fatigue quickly and her to have some increased toe drag with the resisted walking and the 5# hip exercises ont he pulley system, this seemed to really be hard for the weak right LE and we could really see a difference her her strenth from side to side    PT Next Visit Plan  really work on the right LE strength    Consulted and Agree with Plan of Care  Patient       Patient will benefit from skilled therapeutic intervention in order to improve the following deficits and impairments:  Abnormal gait, Cardiopulmonary status limiting activity, Decreased activity tolerance, Decreased balance, Decreased mobility, Decreased strength, Postural dysfunction, Improper body mechanics, Impaired flexibility, Pain, Increased muscle spasms, Decreased range of motion, Difficulty walking  Visit Diagnosis: Muscle weakness (generalized)  Acute bilateral low back pain with bilateral sciatica  Difficulty in walking, not elsewhere classified  Muscle spasm of back     Problem List Patient Active Problem List   Diagnosis Date Noted  . Osteoarthritis of right knee   . Vertigo   . DDD  (degenerative disc disease), cervical   . Benign essential HTN   . History of DVT (deep vein thrombosis)   . Tachycardia   . Steroid-induced hyperglycemia   . Hypothyroidism   . Neuropathic pain   . Acute blood loss anemia   . S/P lumbar fusion   . Closed  L5 vertebral fracture ([place]) 08/31/2017  . Closed fracture of fifth lumbar vertebra ([place]) 08/30/2017  . Lumbar vertebral fracture ([place]) 07/17/2017  . Scoliosis 06/13/2017    Sumner Boast., PT 03/27/2018, 3:19 PM  [facility] [place], [place], [address] Phone: [phone]   Fax:  [phone]  Name: [name] MRN: [id number] Date of Birth: Jan 19, 1933

## 2018-03-29 ENCOUNTER — Ambulatory Visit: Payer: Medicare Other | Admitting: Physical Therapy

## 2018-03-29 ENCOUNTER — Encounter: Payer: Self-pay | Admitting: Physical Therapy

## 2018-03-29 DIAGNOSIS — M6281 Muscle weakness (generalized): Secondary | ICD-10-CM

## 2018-03-29 DIAGNOSIS — M5441 Lumbago with sciatica, right side: Secondary | ICD-10-CM

## 2018-03-29 DIAGNOSIS — R262 Difficulty in walking, not elsewhere classified: Secondary | ICD-10-CM

## 2018-03-29 DIAGNOSIS — M5442 Lumbago with sciatica, left side: Secondary | ICD-10-CM

## 2018-03-29 NOTE — Therapy (Signed)
[facility] [address] [place], [place], [address] Phone: [phone]   Fax:  [phone]  Physical Therapy Treatment  Patient Details  Name: ARBADELLA KIMBLER MRN: [id number] Date of Birth: 1932-11-26 Referring Provider (PT): Elsner   Encounter Date: 03/29/2018  PT End of Session - 03/29/18 1507    Visit Number  28    Date for PT Re-Evaluation  04/01/18    PT Start Time  1350    PT Stop Time  1430    PT Time Calculation (min)  40 min    Activity Tolerance  Patient tolerated treatment well    Behavior During Therapy  [facility] for tasks assessed/performed       Past Medical History:  Diagnosis Date  . Arthritis   . GERD (gastroesophageal reflux disease)   . History of blood transfusion   . Hypothyroidism   . PONV (postoperative nausea and vomiting)   . Ptosis of both eyelids   . Vertigo   . Wears glasses    reading    Past Surgical History:  Procedure Laterality Date  . ABDOMINAL HYSTERECTOMY  1986  . APPLICATION OF ROBOTIC ASSISTANCE FOR SPINAL PROCEDURE N/A 07/17/2017   Procedure: APPLICATION OF ROBOTIC ASSISTANCE FOR SPINAL PROCEDURE;  Surgeon: Kristeen Miss, MD;  Location: [facility];  Service: Neurosurgery;  Laterality: N/A;  . APPLICATION OF ROBOTIC ASSISTANCE FOR SPINAL PROCEDURE N/A 09/01/2017   Procedure: APPLICATION OF ROBOTIC ASSISTANCE FOR SPINAL PROCEDURE;  Surgeon: Kristeen Miss, MD;  Location: [facility];  Service: Neurosurgery;  Laterality: N/A;  . [place]  2000   right  . CARPAL TUNNEL RELEASE  2012   left  . COLONOSCOPY    . CYSTOCELE REPAIR  2007   sling  . DISTAL INTERPHALANGEAL JOINT FUSION Right 02/04/2014   Procedure: FUSION DISTAL INTERPHALANGEAL JOINT RIGHT INDEX FINGER ;  Surgeon: Daryll Brod, MD;  Location: [facility];  Service: Orthopedics;  Laterality: Right;  . EYE SURGERY Bilateral    Cataract surgery with lens implant  .  KNEE SURGERY  2009,2011   partial knee  . OPEN REDUCTION INTERNAL FIXATION (ORIF) PROXIMAL PHALANX Left 08/30/2013   Procedure: OPEN REDUCTION INTERNAL FIXATION (ORIF) PROXIMAL PHALANX FRACTURE LEFT SMALL FINGER; SPLINT RING FINGER;  Surgeon: Wynonia Sours, MD;  Location: [facility];  Service: Orthopedics;  Laterality: Left;  . POSTERIOR LUMBAR FUSION 4 LEVEL N/A 07/17/2017   Procedure: Thoracic Ten - Lumbar Five revison of hardware with Mazor;  Surgeon: Kristeen Miss, MD;  Location: [facility];  Service: Neurosurgery;  Laterality: N/A;  Thoracic/Lumbar  . PTOSIS REPAIR Bilateral 07/27/2015   Procedure: PTOSIS REPAIR;  Surgeon: Cristine Polio, MD;  Location: [facility];  Service: Plastics;  Laterality: Bilateral;  . SHOULDER SURGERY     rt rcr,and lt  . TONSILLECTOMY      There were no vitals filed for this visit.  Subjective Assessment - 03/29/18 1355    Subjective  I think I am getting stronger    Currently in Pain?  No/denies                       [facility] Adult PT Treatment/Exercise - 03/29/18 0001      Ambulation/Gait   Gait Comments  Gait with SPC, outside around the building, no rest, then short rest and then walking around the parking island.  She had some increased scuffing of the right foot today, she had two instances that required CGA to right balance, when she became tired she needed HHA, there are times when she is talking and looking around while walking that she is unstable and wobbles and weaves      High Level Balance   High Level Balance Comments  standing on airex red tband scapular stabilization, resisted gait with the pulley system 30# this was very difficult for her right leg, SLS , ball kicking with SPC      Lumbar Exercises: Aerobic   Recumbent Bike  level 2 x 6 minutes      Lumbar Exercises: Machines for Strengthening   Cybex Knee Flexion  right only 20# 3x10               PT Short Term Goals - 10/16/17 1144       PT SHORT TERM GOAL #1   Title  independent with initial HEP    Status  Achieved        PT Long Term Goals - 03/27/18 1518      PT LONG TERM GOAL #3   Title  increase LE strength to 4+/5    Status  Partially Met      PT LONG TERM GOAL #4   Title  go up and down stairs step over step    Status  Partially Met      PT LONG TERM GOAL #5   Title  walk 600 feet without assistive device    Status  Partially Met            Plan - 03/29/18 1508    Clinical Impression Statement  The gait today showed she is susceptible to falls with fatigue, she had some increased scuffing of the foot with walking today, she had two instances where she needed CGA to right self, the multitasking is difficult for her with talking, walking and looking she is off balance.    PT Next Visit Plan  work gait, balance    Consulted and Agree with Plan of Care  Patient       Patient will benefit from skilled therapeutic intervention in order to improve the following deficits and impairments:  Abnormal gait, Cardiopulmonary status limiting activity, Decreased activity tolerance, Decreased balance, Decreased mobility, Decreased strength, Postural dysfunction, Improper body mechanics, Impaired flexibility, Pain, Increased muscle spasms, Decreased range of motion, Difficulty walking  Visit Diagnosis: Muscle weakness (generalized)  Acute bilateral low back pain with bilateral sciatica  Difficulty in walking, not elsewhere classified     Problem List Patient Active Problem List   Diagnosis Date Noted  . Osteoarthritis of right knee   . Vertigo   . DDD (degenerative disc disease), cervical   . Benign essential HTN   . History of DVT (deep vein thrombosis)   . Tachycardia   . Steroid-induced hyperglycemia   . Hypothyroidism   . Neuropathic pain   . Acute blood loss anemia   . S/P lumbar fusion   . Closed L5 vertebral fracture ([place]) 08/31/2017  . Closed fracture of fifth lumbar vertebra ([place])  08/30/2017  . Lumbar vertebral fracture ([place]) 07/17/2017  . Scoliosis 06/13/2017    Sumner Boast., PT 03/29/2018, 3:10 PM  [place] [address] [place], [place], [address] Phone: [phone]   Fax:  [phone]  Name: JOAN HERSCHBERGER MRN: [id number] Date of Birth: 02-28-1933

## 2018-04-03 ENCOUNTER — Ambulatory Visit: Payer: Medicare Other | Admitting: Physical Therapy

## 2018-04-03 ENCOUNTER — Encounter: Payer: Self-pay | Admitting: Physical Therapy

## 2018-04-03 DIAGNOSIS — M62830 Muscle spasm of back: Secondary | ICD-10-CM

## 2018-04-03 DIAGNOSIS — M6281 Muscle weakness (generalized): Secondary | ICD-10-CM

## 2018-04-03 DIAGNOSIS — R262 Difficulty in walking, not elsewhere classified: Secondary | ICD-10-CM

## 2018-04-03 DIAGNOSIS — M5441 Lumbago with sciatica, right side: Secondary | ICD-10-CM

## 2018-04-03 DIAGNOSIS — M5442 Lumbago with sciatica, left side: Secondary | ICD-10-CM

## 2018-04-03 NOTE — Therapy (Signed)
[address] [place], [place], [address] Phone: [phone]   Fax:  [phone] Progress Note Reporting Period  02/27/18 to 04/03/18 for visit 41-50  See note below for Objective Data and Assessment of Progress/Goals.      Physical Therapy Treatment  Patient Details  Name: [name] MRN: [id number] Date of Birth: 10-Jun-1932 Referring Provider (PT): Elsner   Encounter Date: 04/03/2018  PT End of Session - 04/03/18 1432    Visit Number  50    Date for PT Re-Evaluation  05/04/18    PT Start Time  1352    PT Stop Time  1449    PT Time Calculation (min)  57 min    Activity Tolerance  Patient tolerated treatment well    Behavior During Therapy  [facility] for tasks assessed/performed       Past Medical History:  Diagnosis Date  . Arthritis   . GERD (gastroesophageal reflux disease)   . History of blood transfusion   . Hypothyroidism   . PONV (postoperative nausea and vomiting)   . Ptosis of both eyelids   . Vertigo   . Wears glasses    reading    Past Surgical History:  Procedure Laterality Date  . ABDOMINAL HYSTERECTOMY  1986  . APPLICATION OF ROBOTIC ASSISTANCE FOR SPINAL PROCEDURE N/A 07/17/2017   Procedure: APPLICATION OF ROBOTIC ASSISTANCE FOR SPINAL PROCEDURE;  Surgeon: Kristeen Miss, MD;  Location: [facility];  Service: Neurosurgery;  Laterality: N/A;  . APPLICATION OF ROBOTIC ASSISTANCE FOR SPINAL PROCEDURE N/A 09/01/2017   Procedure: APPLICATION OF ROBOTIC ASSISTANCE FOR SPINAL PROCEDURE;  Surgeon: Kristeen Miss, MD;  Location: [facility];  Service: Neurosurgery;  Laterality: N/A;  . [place]  2000   right  . CARPAL TUNNEL RELEASE  2012   left  . COLONOSCOPY    . CYSTOCELE REPAIR  2007   sling  . DISTAL INTERPHALANGEAL JOINT FUSION Right 02/04/2014   Procedure: FUSION DISTAL INTERPHALANGEAL JOINT RIGHT INDEX FINGER ;  Surgeon: Daryll Brod, MD;   Location: [facility];  Service: Orthopedics;  Laterality: Right;  . EYE SURGERY Bilateral    Cataract surgery with lens implant  . KNEE SURGERY  2009,2011   partial knee  . OPEN REDUCTION INTERNAL FIXATION (ORIF) PROXIMAL PHALANX Left 08/30/2013   Procedure: OPEN REDUCTION INTERNAL FIXATION (ORIF) PROXIMAL PHALANX FRACTURE LEFT SMALL FINGER; SPLINT RING FINGER;  Surgeon: Wynonia Sours, MD;  Location: [facility];  Service: Orthopedics;  Laterality: Left;  . POSTERIOR LUMBAR FUSION 4 LEVEL N/A 07/17/2017   Procedure: Thoracic Ten - Lumbar Five revison of hardware with Mazor;  Surgeon: Kristeen Miss, MD;  Location: [facility];  Service: Neurosurgery;  Laterality: N/A;  Thoracic/Lumbar  . PTOSIS REPAIR Bilateral 07/27/2015   Procedure: PTOSIS REPAIR;  Surgeon: Cristine Polio, MD;  Location: [facility];  Service: Plastics;  Laterality: Bilateral;  . SHOULDER SURGERY     rt rcr,and lt  . TONSILLECTOMY      There were no vitals filed for this visit.  Subjective Assessment - 04/03/18 1354    Subjective  Patient reports that she is having some swelling and redness in her right ankle.  Unsure of why    Currently in Pain?  No/denies  [facility] Adult PT Treatment/Exercise - 04/03/18 0001      Transfers   Comments  worked on her kneeling down onto an airex and getting upm this hurt the knees and a little back pain, then did kneeling on the Bosu.      High Level Balance   High Level Balance Activities  Side stepping;Backward walking;Marching forwards;Tandem walking    High Level Balance Comments  stanidng on airex holidng SPC alternating toe touches to 6" step, ball kicks      Lumbar Exercises: Aerobic   Recumbent Bike  level 2 x 6 minutes      Lumbar Exercises: Machines for Strengthening   Cybex Knee Flexion  35# 2x10    Other Lumbar Machine Exercise  seated row and lats 35# 3x10 each    Other Lumbar Machine Exercise  10#  standing good posture straight arm pulls, 5# hip extension 2x10, hip abduction x10, right leg very difficult      Lumbar Exercises: Seated   Other Seated Lumbar Exercises  Bosu behind her for abs, then black tband behind her for lumbar extension      Lumbar Exercises: Supine   Bridge with Ball Squeeze  20 reps;2 seconds      Modalities   Modalities  Moist Heat;Electrical Stimulation      Moist Heat Therapy   Number Minutes Moist Heat  15 Minutes    Moist Heat Location  Lumbar Spine      Electrical Stimulation   Electrical Stimulation Location  right low back into the right buttock    Electrical Stimulation Action  IFC    Electrical Stimulation Parameters  supine    Electrical Stimulation Goals  Pain               PT Short Term Goals - 10/16/17 1144      PT SHORT TERM GOAL #1   Title  independent with initial HEP    Status  Achieved        PT Long Term Goals - 03/27/18 1518      PT LONG TERM GOAL #3   Title  increase LE strength to 4+/5    Status  Partially Met      PT LONG TERM GOAL #4   Title  go up and down stairs step over step    Status  Partially Met      PT LONG TERM GOAL #5   Title  walk 600 feet without assistive device    Status  Partially Met            Plan - 04/03/18 1432    Clinical Impression Statement  Patient iwth increased low back pain today, she is unsure of why, she had some pain replicated when we tried the kneeling.  She has goals of being able to get on her knees and clean as well as go to the gym and use the pool.  At this time she feels like she cannot do it due to decreased balance and safety    PT Next Visit Plan  continue to work on her balance and functional mobility    Consulted and Agree with Plan of Care  Patient       Patient will benefit from skilled therapeutic intervention in order to improve the following deficits and impairments:  Abnormal gait, Cardiopulmonary status limiting activity, Decreased activity  tolerance, Decreased balance, Decreased mobility, Decreased strength, Postural dysfunction, Improper body mechanics, Impaired flexibility, Pain, Increased muscle spasms, Decreased range of  motion, Difficulty walking  Visit Diagnosis: Muscle weakness (generalized)  Acute bilateral low back pain with bilateral sciatica  Difficulty in walking, not elsewhere classified  Muscle spasm of back     Problem List Patient Active Problem List   Diagnosis Date Noted  . Osteoarthritis of right knee   . Vertigo   . DDD (degenerative disc disease), cervical   . Benign essential HTN   . History of DVT (deep vein thrombosis)   . Tachycardia   . Steroid-induced hyperglycemia   . Hypothyroidism   . Neuropathic pain   . Acute blood loss anemia   . S/P lumbar fusion   . Closed L5 vertebral fracture ([place]) 08/31/2017  . Closed fracture of fifth lumbar vertebra ([place]) 08/30/2017  . Lumbar vertebral fracture ([place]) 07/17/2017  . Scoliosis 06/13/2017    Sumner Boast., PT 04/03/2018, 3:21 PM  [address] [place], [place], [address] Phone: [phone]   Fax:  [phone]  Name: [name] MRN: [id number] Date of Birth: [birth year]

## 2018-04-10 ENCOUNTER — Ambulatory Visit: Payer: Medicare Other | Admitting: Physical Therapy

## 2018-04-12 ENCOUNTER — Ambulatory Visit: Payer: Medicare Other | Attending: Internal Medicine | Admitting: Physical Therapy

## 2018-04-12 ENCOUNTER — Encounter: Payer: Self-pay | Admitting: Physical Therapy

## 2018-04-12 DIAGNOSIS — M62830 Muscle spasm of back: Secondary | ICD-10-CM | POA: Insufficient documentation

## 2018-04-12 DIAGNOSIS — M6281 Muscle weakness (generalized): Secondary | ICD-10-CM | POA: Insufficient documentation

## 2018-04-12 DIAGNOSIS — R262 Difficulty in walking, not elsewhere classified: Secondary | ICD-10-CM | POA: Insufficient documentation

## 2018-04-12 DIAGNOSIS — M5442 Lumbago with sciatica, left side: Secondary | ICD-10-CM | POA: Insufficient documentation

## 2018-04-12 DIAGNOSIS — M5441 Lumbago with sciatica, right side: Secondary | ICD-10-CM | POA: Diagnosis present

## 2018-04-12 NOTE — Therapy (Signed)
[place], [place], [address] Phone: [phone]   Fax:  [phone]  Physical Therapy Treatment  Patient Details  Name: [name] MRN: [id number] Date of Birth: 1932/12/06 Referring Provider (PT): Elsner   Encounter Date: 04/12/2018  PT End of Session - 04/12/18 1526    Visit Number  49    Date for PT Re-Evaluation  05/04/18    PT Start Time  1436    PT Stop Time  1526    PT Time Calculation (min)  50 min    Activity Tolerance  Patient tolerated treatment well    Behavior During Therapy  [facility] for tasks assessed/performed       Past Medical History:  Diagnosis Date  . Arthritis   . GERD (gastroesophageal reflux disease)   . History of blood transfusion   . Hypothyroidism   . PONV (postoperative nausea and vomiting)   . Ptosis of both eyelids   . Vertigo   . Wears glasses    reading    Past Surgical History:  Procedure Laterality Date  . ABDOMINAL HYSTERECTOMY  1986  . APPLICATION OF ROBOTIC ASSISTANCE FOR SPINAL PROCEDURE N/A 07/17/2017   Procedure: APPLICATION OF ROBOTIC ASSISTANCE FOR SPINAL PROCEDURE;  Surgeon: Kristeen Miss, MD;  Location: [place];  Service: Neurosurgery;  Laterality: N/A;  . APPLICATION OF ROBOTIC ASSISTANCE FOR SPINAL PROCEDURE N/A 09/01/2017   Procedure: APPLICATION OF ROBOTIC ASSISTANCE FOR SPINAL PROCEDURE;  Surgeon: Kristeen Miss, MD;  Location: [place];  Service: Neurosurgery;  Laterality: N/A;  . [place]  2000   right  . CARPAL TUNNEL RELEASE  2012   left  . COLONOSCOPY    . CYSTOCELE REPAIR  2007   sling  . DISTAL INTERPHALANGEAL JOINT FUSION Right 02/04/2014   Procedure: FUSION DISTAL INTERPHALANGEAL JOINT RIGHT INDEX FINGER ;  Surgeon: Daryll Brod, MD;  Location: [place];  Service: Orthopedics;  Laterality: Right;  . EYE SURGERY Bilateral    Cataract surgery with lens implant  .  KNEE SURGERY  2009,2011   partial knee  . OPEN REDUCTION INTERNAL FIXATION (ORIF) PROXIMAL PHALANX Left 08/30/2013   Procedure: OPEN REDUCTION INTERNAL FIXATION (ORIF) PROXIMAL PHALANX FRACTURE LEFT SMALL FINGER; SPLINT RING FINGER;  Surgeon: Wynonia Sours, MD;  Location: [place];  Service: Orthopedics;  Laterality: Left;  . POSTERIOR LUMBAR FUSION 4 LEVEL N/A 07/17/2017   Procedure: Thoracic Ten - Lumbar Five revison of hardware with Mazor;  Surgeon: Kristeen Miss, MD;  Location: [place];  Service: Neurosurgery;  Laterality: N/A;  Thoracic/Lumbar  . PTOSIS REPAIR Bilateral 07/27/2015   Procedure: PTOSIS REPAIR;  Surgeon: Cristine Polio, MD;  Location: [place];  Service: Plastics;  Laterality: Bilateral;  . SHOULDER SURGERY     rt rcr,and lt  . TONSILLECTOMY      There were no vitals filed for this visit.  Subjective Assessment - 04/12/18 1521    Subjective  Patient has not been seen in over a week. she reports that she was sick    Currently in Pain?  No/denies                       [facility] Adult PT Treatment/Exercise - 04/12/18 0001      Transfers   Comments  worked on her kneeling down onto an airex and getting upm this  hurt the knees and a little back pain, then did kneeling on the [place].      Ambulation/Gait   Gait Comments  gait with SPC some outside, negotiating curbs, slopes etc... close supervision, some CGA when she was fatigued      High Level Balance   High Level Balance Activities  Side stepping;Backward walking;Marching forwards;Tandem walking      Lumbar Exercises: Aerobic   Recumbent Bike  level 2 x 6 minutes      Lumbar Exercises: Machines for Strengthening   Other Lumbar Machine Exercise  seated row and lats 35# 3x10 each    Other Lumbar Machine Exercise  10# standing good posture straight arm pulls, 5# hip extension 2x10, hip abduction x10, right leg very difficult      Lumbar Exercises: Standing   Other Standing  Lumbar Exercises  5# hip flexion and abduction, extension      Lumbar Exercises: Seated   Other Seated Lumbar Exercises  black tband lumbar extension    Other Seated Lumbar Exercises  use of red and green ankle weights in front of her and simulated gas and brake, having her react to "STOP" and go from the gas to the brake               PT Short Term Goals - 10/16/17 1144      PT SHORT TERM GOAL #1   Title  independent with initial HEP    Status  Achieved        PT Long Term Goals - 04/12/18 1530      PT LONG TERM GOAL #1   Title  decrease pain 50%    Status  Achieved      PT LONG TERM GOAL #2   Title  increase Lumbar ROM 25%    Status  Achieved      PT LONG TERM GOAL #3   Title  increase LE strength to 4+/5    Status  Partially Met      PT LONG TERM GOAL #4   Title  go up and down stairs step over step    Status  Partially Met            Plan - 04/12/18 1527    Clinical Impression Statement  Patient much more fatigued today after being sick, she needed some closer supervision when she was tired but we did increase the distance today.  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    PT Next Visit Plan  continue to work on her balance and functional mobility    Consulted and Agree with Plan of Care  Patient       Patient will benefit from skilled therapeutic intervention in order to improve the following deficits and impairments:  Abnormal gait, Cardiopulmonary status limiting activity, Decreased activity tolerance, Decreased balance, Decreased mobility, Decreased strength, Postural dysfunction, Improper body mechanics, Impaired flexibility, Pain, Increased muscle spasms, Decreased range of motion, Difficulty walking  Visit Diagnosis: Muscle weakness (generalized)  Acute bilateral low back pain with bilateral  sciatica  Difficulty in walking, not elsewhere classified  Muscle spasm of back     Problem List Patient Active Problem List   Diagnosis Date Noted  . Osteoarthritis of right knee   . Vertigo   . DDD (degenerative disc disease), cervical   . Benign essential  HTN   . History of DVT (deep vein thrombosis)   . Tachycardia   . Steroid-induced hyperglycemia   . Hypothyroidism   . Neuropathic pain   . Acute blood loss anemia   . S/P lumbar fusion   . Closed L5 vertebral fracture ([place]) 08/31/2017  . Closed fracture of fifth lumbar vertebra ([place]) 08/30/2017  . Lumbar vertebral fracture ([place]) 07/17/2017  . Scoliosis 06/13/2017    Sumner Boast., PT 04/12/2018, 3:31 PM  [place] [address] [place], [place], [address] Phone: [phone]   Fax:  [phone]  Name: [name] MRN: [id number] Date of Birth: Apr 01, 1933

## 2018-04-17 ENCOUNTER — Encounter: Payer: Self-pay | Admitting: Physical Therapy

## 2018-04-17 ENCOUNTER — Ambulatory Visit: Payer: Medicare Other | Admitting: Physical Therapy

## 2018-04-17 DIAGNOSIS — M5442 Lumbago with sciatica, left side: Secondary | ICD-10-CM

## 2018-04-17 DIAGNOSIS — M6281 Muscle weakness (generalized): Secondary | ICD-10-CM | POA: Diagnosis not present

## 2018-04-17 DIAGNOSIS — R262 Difficulty in walking, not elsewhere classified: Secondary | ICD-10-CM

## 2018-04-17 DIAGNOSIS — M5441 Lumbago with sciatica, right side: Secondary | ICD-10-CM

## 2018-04-17 NOTE — Therapy (Signed)
[place], [place], [address] Phone: [phone]   Fax:  [phone]  Physical Therapy Treatment  Patient Details  Name: [name] MRN: [id number] Date of Birth: 02-11-1933 Referring Provider (PT): Elsner   Encounter Date: 04/17/2018  PT End of Session - 04/17/18 1507    Visit Number  76    PT Start Time  0814    PT Stop Time  1440    PT Time Calculation (min)  47 min    Activity Tolerance  Patient tolerated treatment well    Behavior During Therapy  [facility] for tasks assessed/performed       Past Medical History:  Diagnosis Date  . Arthritis   . GERD (gastroesophageal reflux disease)   . History of blood transfusion   . Hypothyroidism   . PONV (postoperative nausea and vomiting)   . Ptosis of both eyelids   . Vertigo   . Wears glasses    reading    Past Surgical History:  Procedure Laterality Date  . ABDOMINAL HYSTERECTOMY  1986  . APPLICATION OF ROBOTIC ASSISTANCE FOR SPINAL PROCEDURE N/A 07/17/2017   Procedure: APPLICATION OF ROBOTIC ASSISTANCE FOR SPINAL PROCEDURE;  Surgeon: Kristeen Miss, MD;  Location: [place];  Service: Neurosurgery;  Laterality: N/A;  . APPLICATION OF ROBOTIC ASSISTANCE FOR SPINAL PROCEDURE N/A 09/01/2017   Procedure: APPLICATION OF ROBOTIC ASSISTANCE FOR SPINAL PROCEDURE;  Surgeon: Kristeen Miss, MD;  Location: [place];  Service: Neurosurgery;  Laterality: N/A;  . [place]  2000   right  . CARPAL TUNNEL RELEASE  2012   left  . COLONOSCOPY    . CYSTOCELE REPAIR  2007   sling  . DISTAL INTERPHALANGEAL JOINT FUSION Right 02/04/2014   Procedure: FUSION DISTAL INTERPHALANGEAL JOINT RIGHT INDEX FINGER ;  Surgeon: Daryll Brod, MD;  Location: [place];  Service: Orthopedics;  Laterality: Right;  . EYE SURGERY Bilateral    Cataract surgery with lens implant  . KNEE SURGERY  2009,2011   partial knee   . OPEN REDUCTION INTERNAL FIXATION (ORIF) PROXIMAL PHALANX Left 08/30/2013   Procedure: OPEN REDUCTION INTERNAL FIXATION (ORIF) PROXIMAL PHALANX FRACTURE LEFT SMALL FINGER; SPLINT RING FINGER;  Surgeon: Wynonia Sours, MD;  Location: [place];  Service: Orthopedics;  Laterality: Left;  . POSTERIOR LUMBAR FUSION 4 LEVEL N/A 07/17/2017   Procedure: Thoracic Ten - Lumbar Five revison of hardware with Mazor;  Surgeon: Kristeen Miss, MD;  Location: [place];  Service: Neurosurgery;  Laterality: N/A;  Thoracic/Lumbar  . PTOSIS REPAIR Bilateral 07/27/2015   Procedure: PTOSIS REPAIR;  Surgeon: Cristine Polio, MD;  Location: [place];  Service: Plastics;  Laterality: Bilateral;  . SHOULDER SURGERY     rt rcr,and lt  . TONSILLECTOMY      There were no vitals filed for this visit.  Subjective Assessment - 04/17/18 1355    Subjective  Patient reports soreness in her back today, "maybe the way I sleep"    Currently in Pain?  Yes    Pain Score  3     Pain Location  Back    Pain Orientation  Lower    Pain Descriptors / Indicators  Sore    Aggravating Factors   sleeping may cause some pain         [facility] PT Assessment - 04/17/18 0001      AROM  Overall AROM Comments  right ankle DF in supine to 5 degrees                   [facility] Adult PT Treatment/Exercise - 04/17/18 0001      Transfers   Comments  worked on her getting onto her knees on the airex      Ambulation/Gait   Gait Comments  gait with SPC some outside, negotiating curbs, slopes etc... close supervision, some CGA when she was fatigued      High Level Balance   High Level Balance Activities  Side stepping;Tandem walking;Negotitating around obstacles;Negotiating over obstacles    High Level Balance Comments  ball toss and ball kicks, the ovstacle negotiation was with a SPC      Lumbar Exercises: Stretches   Gastroc Stretch  3 reps;20 seconds;Right      Lumbar Exercises: Aerobic    Recumbent Bike  level 2 x 6 minutes      Lumbar Exercises: Machines for Strengthening   Cybex Knee Extension  15# 2x10    Cybex Knee Flexion  35# 2x10      Lumbar Exercises: Seated   Other Seated Lumbar Exercises  use of red and green ankle weights in front of her and simulated gas and brake, having her react to "STOP" and go from the gas to the brake      Lumbar Exercises: Sidelying   Clam  Right;20 reps;2 seconds    Hip Abduction  Right;20 reps               PT Short Term Goals - 10/16/17 1144      PT SHORT TERM GOAL #1   Title  independent with initial HEP    Status  Achieved        PT Long Term Goals - 04/12/18 1530      PT LONG TERM GOAL #1   Title  decrease pain 50%    Status  Achieved      PT LONG TERM GOAL #2   Title  increase Lumbar ROM 25%    Status  Achieved      PT LONG TERM GOAL #3   Title  increase LE strength to 4+/5    Status  Partially Met      PT LONG TERM GOAL #4   Title  go up and down stairs step over step    Status  Partially Met            Plan - 04/17/18 1508    Clinical Impression Statement  Patient demonstrating increase right ankle DF.  She looks more confident in walking with the [facility], however she did fatigue and with distraction of someone talking to her from the other side of the rooma nd her looking over while walking the toe did drag.  She still has some right knee pain after the fall    PT Next Visit Plan  work on balance and safety    Consulted and Agree with Plan of Care  Patient       Patient will benefit from skilled therapeutic intervention in order to improve the following deficits and impairments:  Abnormal gait, Cardiopulmonary status limiting activity, Decreased activity tolerance, Decreased balance, Decreased mobility, Decreased strength, Postural dysfunction, Improper body mechanics, Impaired flexibility, Pain, Increased muscle spasms, Decreased range of motion, Difficulty walking  Visit Diagnosis: Muscle  weakness (generalized)  Acute bilateral low back pain with bilateral sciatica  Difficulty in walking, not elsewhere classified  Problem List Patient Active Problem List   Diagnosis Date Noted  . Osteoarthritis of right knee   . Vertigo   . DDD (degenerative disc disease), cervical   . Benign essential HTN   . History of DVT (deep vein thrombosis)   . Tachycardia   . Steroid-induced hyperglycemia   . Hypothyroidism   . Neuropathic pain   . Acute blood loss anemia   . S/P lumbar fusion   . Closed L5 vertebral fracture ([place]) 08/31/2017  . Closed fracture of fifth lumbar vertebra ([place]) 08/30/2017  . Lumbar vertebral fracture ([place]) 07/17/2017  . Scoliosis 06/13/2017    Sumner Boast., PT 04/17/2018, 3:10 PM  [place] [address] [place], [place], [address] Phone: [phone]   Fax:  [phone]  Name: [name] MRN: [id number] Date of Birth: [birth year]

## 2018-04-19 ENCOUNTER — Ambulatory Visit: Payer: Medicare Other | Admitting: Physical Therapy

## 2018-04-19 ENCOUNTER — Encounter: Payer: Self-pay | Admitting: Physical Therapy

## 2018-04-19 DIAGNOSIS — M62830 Muscle spasm of back: Secondary | ICD-10-CM

## 2018-04-19 DIAGNOSIS — R262 Difficulty in walking, not elsewhere classified: Secondary | ICD-10-CM

## 2018-04-19 DIAGNOSIS — M5441 Lumbago with sciatica, right side: Secondary | ICD-10-CM

## 2018-04-19 DIAGNOSIS — M5442 Lumbago with sciatica, left side: Secondary | ICD-10-CM

## 2018-04-19 DIAGNOSIS — M6281 Muscle weakness (generalized): Secondary | ICD-10-CM

## 2018-04-19 NOTE — Therapy (Signed)
[place], [place], [address] Phone: [phone]   Fax:  [phone]  Physical Therapy Treatment  Patient Details  Name: [name] MRN: [id number] Date of Birth: 1933-03-10 Referring Provider (PT): Elsner   Encounter Date: 04/19/2018  PT End of Session - 04/19/18 1521    Visit Number  11    Date for PT Re-Evaluation  05/04/18    PT Start Time  1440    PT Stop Time  1522    PT Time Calculation (min)  42 min    Activity Tolerance  Patient tolerated treatment well    Behavior During Therapy  [facility] for tasks assessed/performed       Past Medical History:  Diagnosis Date  . Arthritis   . GERD (gastroesophageal reflux disease)   . History of blood transfusion   . Hypothyroidism   . PONV (postoperative nausea and vomiting)   . Ptosis of both eyelids   . Vertigo   . Wears glasses    reading    Past Surgical History:  Procedure Laterality Date  . ABDOMINAL HYSTERECTOMY  1986  . APPLICATION OF ROBOTIC ASSISTANCE FOR SPINAL PROCEDURE N/A 07/17/2017   Procedure: APPLICATION OF ROBOTIC ASSISTANCE FOR SPINAL PROCEDURE;  Surgeon: Kristeen Miss, MD;  Location: [place];  Service: Neurosurgery;  Laterality: N/A;  . APPLICATION OF ROBOTIC ASSISTANCE FOR SPINAL PROCEDURE N/A 09/01/2017   Procedure: APPLICATION OF ROBOTIC ASSISTANCE FOR SPINAL PROCEDURE;  Surgeon: Kristeen Miss, MD;  Location: [place];  Service: Neurosurgery;  Laterality: N/A;  . [place]  2000   right  . CARPAL TUNNEL RELEASE  2012   left  . COLONOSCOPY    . CYSTOCELE REPAIR  2007   sling  . DISTAL INTERPHALANGEAL JOINT FUSION Right 02/04/2014   Procedure: FUSION DISTAL INTERPHALANGEAL JOINT RIGHT INDEX FINGER ;  Surgeon: Daryll Brod, MD;  Location: [place];  Service: Orthopedics;  Laterality: Right;  . EYE SURGERY Bilateral    Cataract surgery with lens implant  .  KNEE SURGERY  2009,2011   partial knee  . OPEN REDUCTION INTERNAL FIXATION (ORIF) PROXIMAL PHALANX Left 08/30/2013   Procedure: OPEN REDUCTION INTERNAL FIXATION (ORIF) PROXIMAL PHALANX FRACTURE LEFT SMALL FINGER; SPLINT RING FINGER;  Surgeon: Wynonia Sours, MD;  Location: [place];  Service: Orthopedics;  Laterality: Left;  . POSTERIOR LUMBAR FUSION 4 LEVEL N/A 07/17/2017   Procedure: Thoracic Ten - Lumbar Five revison of hardware with Mazor;  Surgeon: Kristeen Miss, MD;  Location: [place];  Service: Neurosurgery;  Laterality: N/A;  Thoracic/Lumbar  . PTOSIS REPAIR Bilateral 07/27/2015   Procedure: PTOSIS REPAIR;  Surgeon: Cristine Polio, MD;  Location: [place];  Service: Plastics;  Laterality: Bilateral;  . SHOULDER SURGERY     rt rcr,and lt  . TONSILLECTOMY      There were no vitals filed for this visit.  Subjective Assessment - 04/19/18 1439    Subjective  Patient reports that she is feeling stronger    Currently in Pain?  Yes    Pain Score  3     Pain Location  Back    Pain Orientation  Lower    Pain Descriptors / Indicators  Sore                       [facility] Adult PT Treatment/Exercise - 04/19/18  0001      Ambulation/Gait   Gait Comments  stairs with two handrails step over step up and down 2 flights, at the end this caused some knee pain, she reports that this is the first time she has been able to do this I was SBA      High Level Balance   High Level Balance Activities  Side stepping;Backward walking;Negotiating over obstacles    High Level Balance Comments  ball toss and ball kicks, the ovstacle negotiation was with a SPC      Lumbar Exercises: Standing   Other Standing Lumbar Exercises  5# hip flexion and abduction, extension, 2x10 each      Lumbar Exercises: Seated   Sit to Stand  20 reps    Sit to Stand Limitations  no UE    Other Seated Lumbar Exercises  black tband lumbar extension    Other Seated Lumbar Exercises   use of red and green ankle weights in front of her and simulated gas and brake, having her react to "STOP" and go from the gas to the brake      Knee/Hip Exercises: Standing   Forward Step Up  Step Height: 4";20 reps;Both    Step Down  Step Height: 4";20 reps;Both      Knee/Hip Exercises: Seated   Other Seated Knee/Hip Exercises  ball in lap abdominal isometrics               PT Short Term Goals - 10/16/17 1144      PT SHORT TERM GOAL #1   Title  independent with initial HEP    Status  Achieved        PT Long Term Goals - 04/19/18 1524      PT LONG TERM GOAL #4   Title  go up and down stairs step over step    Status  Achieved            Plan - 04/19/18 1522    Clinical Impression Statement  Patient was able to do the stairs for the first time today step over step up and down with two handrails and SBA.  She tolerated walking with the SPC few cues for the posture, less cues for the foot.  She reports that she is feeling better about the movement of her foot and is going to try to drive this weekend    PT Next Visit Plan  see if she tried to drive    Consulted and Agree with Plan of Care  Patient       Patient will benefit from skilled therapeutic intervention in order to improve the following deficits and impairments:  Abnormal gait, Cardiopulmonary status limiting activity, Decreased activity tolerance, Decreased balance, Decreased mobility, Decreased strength, Postural dysfunction, Improper body mechanics, Impaired flexibility, Pain, Increased muscle spasms, Decreased range of motion, Difficulty walking  Visit Diagnosis: Muscle weakness (generalized)  Acute bilateral low back pain with bilateral sciatica  Difficulty in walking, not elsewhere classified  Muscle spasm of back     Problem List Patient Active Problem List   Diagnosis Date Noted  . Osteoarthritis of right knee   . Vertigo   . DDD (degenerative disc disease), cervical   . Benign  essential HTN   . History of DVT (deep vein thrombosis)   . Tachycardia   . Steroid-induced hyperglycemia   . Hypothyroidism   . Neuropathic pain   . Acute blood loss anemia   . S/P lumbar fusion   .  Closed L5 vertebral fracture ([place]) 08/31/2017  . Closed fracture of fifth lumbar vertebra ([place]) 08/30/2017  . Lumbar vertebral fracture ([place]) 07/17/2017  . Scoliosis 06/13/2017    Sumner Boast., PT 04/19/2018, 3:25 PM  [place] [address] [place], [place], [address] Phone: [phone]   Fax:  [phone]  Name: [name] MRN: [id number] Date of Birth: 1932/05/20

## 2018-04-24 ENCOUNTER — Encounter: Payer: Self-pay | Admitting: Physical Therapy

## 2018-04-24 ENCOUNTER — Ambulatory Visit: Payer: Medicare Other | Admitting: Physical Therapy

## 2018-04-24 DIAGNOSIS — R262 Difficulty in walking, not elsewhere classified: Secondary | ICD-10-CM

## 2018-04-24 DIAGNOSIS — M6281 Muscle weakness (generalized): Secondary | ICD-10-CM | POA: Diagnosis not present

## 2018-04-24 DIAGNOSIS — M5442 Lumbago with sciatica, left side: Secondary | ICD-10-CM

## 2018-04-24 DIAGNOSIS — M62830 Muscle spasm of back: Secondary | ICD-10-CM

## 2018-04-24 DIAGNOSIS — M5441 Lumbago with sciatica, right side: Secondary | ICD-10-CM

## 2018-04-24 NOTE — Therapy (Signed)
[place], [place], [address] Phone: [phone]   Fax:  [phone]  Physical Therapy Treatment  Patient Details  Name: [name] MRN: [id number] Date of Birth: 08/02/1932 Referring Provider (PT): Elsner   Encounter Date: 04/24/2018  PT End of Session - 04/24/18 1433    Visit Number  15    Date for PT Re-Evaluation  05/04/18    PT Start Time  1340    PT Stop Time  1430    PT Time Calculation (min)  50 min    Activity Tolerance  Patient tolerated treatment well    Behavior During Therapy  [facility] for tasks assessed/performed       Past Medical History:  Diagnosis Date  . Arthritis   . GERD (gastroesophageal reflux disease)   . History of blood transfusion   . Hypothyroidism   . PONV (postoperative nausea and vomiting)   . Ptosis of both eyelids   . Vertigo   . Wears glasses    reading    Past Surgical History:  Procedure Laterality Date  . ABDOMINAL HYSTERECTOMY  1986  . APPLICATION OF ROBOTIC ASSISTANCE FOR SPINAL PROCEDURE N/A 07/17/2017   Procedure: APPLICATION OF ROBOTIC ASSISTANCE FOR SPINAL PROCEDURE;  Surgeon: Kristeen Miss, MD;  Location: [place];  Service: Neurosurgery;  Laterality: N/A;  . APPLICATION OF ROBOTIC ASSISTANCE FOR SPINAL PROCEDURE N/A 09/01/2017   Procedure: APPLICATION OF ROBOTIC ASSISTANCE FOR SPINAL PROCEDURE;  Surgeon: Kristeen Miss, MD;  Location: [place];  Service: Neurosurgery;  Laterality: N/A;  . [place]  2000   right  . CARPAL TUNNEL RELEASE  2012   left  . COLONOSCOPY    . CYSTOCELE REPAIR  2007   sling  . DISTAL INTERPHALANGEAL JOINT FUSION Right 02/04/2014   Procedure: FUSION DISTAL INTERPHALANGEAL JOINT RIGHT INDEX FINGER ;  Surgeon: Daryll Brod, MD;  Location: [place];  Service: Orthopedics;  Laterality: Right;  . EYE SURGERY Bilateral    Cataract surgery with lens implant  .  KNEE SURGERY  2009,2011   partial knee  . OPEN REDUCTION INTERNAL FIXATION (ORIF) PROXIMAL PHALANX Left 08/30/2013   Procedure: OPEN REDUCTION INTERNAL FIXATION (ORIF) PROXIMAL PHALANX FRACTURE LEFT SMALL FINGER; SPLINT RING FINGER;  Surgeon: Wynonia Sours, MD;  Location: [place];  Service: Orthopedics;  Laterality: Left;  . POSTERIOR LUMBAR FUSION 4 LEVEL N/A 07/17/2017   Procedure: Thoracic Ten - Lumbar Five revison of hardware with Mazor;  Surgeon: Kristeen Miss, MD;  Location: [place];  Service: Neurosurgery;  Laterality: N/A;  Thoracic/Lumbar  . PTOSIS REPAIR Bilateral 07/27/2015   Procedure: PTOSIS REPAIR;  Surgeon: Cristine Polio, MD;  Location: [place];  Service: Plastics;  Laterality: Bilateral;  . SHOULDER SURGERY     rt rcr,and lt  . TONSILLECTOMY      There were no vitals filed for this visit.  Subjective Assessment - 04/24/18 1346    Subjective  I am stiff, it is cold.    Currently in Pain?  No/denies                       [facility] Adult PT Treatment/Exercise - 04/24/18 0001      Ambulation/Gait   Gait Comments  gait 280 feet with SPC and then 280' without cane and SBA, as she fatigued she did catch the right  foot once.      High Level Balance   High Level Balance Activities  Side stepping;Negotiating over obstacles    High Level Balance Comments  use of a SPC over the obstacles, ball toss, ball kicks, 8" toe touches, step on and off airex      Lumbar Exercises: Standing   Other Standing Lumbar Exercises  5# hip flexion and abduction, extension, 2x10 each      Lumbar Exercises: Seated   Sit to Stand  20 reps    Sit to Stand Limitations  with weighted ball and then reach overhead with the ball    Other Seated Lumbar Exercises  black tband lumbar extension, bosu behind cruches    Other Seated Lumbar Exercises  weighted ball side to side trunk work      Knee/Hip Exercises: Seated   Other Seated Knee/Hip Exercises  ball in  lap abdominal isometrics               PT Short Term Goals - 10/16/17 1144      PT SHORT TERM GOAL #1   Title  independent with initial HEP    Status  Achieved        PT Long Term Goals - 04/19/18 1524      PT LONG TERM GOAL #4   Title  go up and down stairs step over step    Status  Achieved            Plan - 04/24/18 1434    Clinical Impression Statement  Patient was able to do walking to the front door and back without device, SBA, at the end of the treatment when she was tired, had once where the foot caught but she was able to right herself.  She did attempt to drive this weekend and she reports that she did fine without incident.  As she fatigues the foot does drag    PT Next Visit Plan  will continue to work on her strength and function    Consulted and Agree with Plan of Care  Patient       Patient will benefit from skilled therapeutic intervention in order to improve the following deficits and impairments:  Abnormal gait, Cardiopulmonary status limiting activity, Decreased activity tolerance, Decreased balance, Decreased mobility, Decreased strength, Postural dysfunction, Improper body mechanics, Impaired flexibility, Pain, Increased muscle spasms, Decreased range of motion, Difficulty walking  Visit Diagnosis: Muscle weakness (generalized)  Acute bilateral low back pain with bilateral sciatica  Difficulty in walking, not elsewhere classified  Muscle spasm of back     Problem List Patient Active Problem List   Diagnosis Date Noted  . Osteoarthritis of right knee   . Vertigo   . DDD (degenerative disc disease), cervical   . Benign essential HTN   . History of DVT (deep vein thrombosis)   . Tachycardia   . Steroid-induced hyperglycemia   . Hypothyroidism   . Neuropathic pain   . Acute blood loss anemia   . S/P lumbar fusion   . Closed L5 vertebral fracture ([place]) 08/31/2017  . Closed fracture of fifth lumbar vertebra ([place]) 08/30/2017  .  Lumbar vertebral fracture ([place]) 07/17/2017  . Scoliosis 06/13/2017    Sumner Boast., PT 04/24/2018, 2:36 PM  [place] [address] [place], [place], [address] Phone: [phone]   Fax:  [phone]  Name: [name] MRN: [id number] Date of Birth: 1933/02/02

## 2018-04-26 ENCOUNTER — Ambulatory Visit: Payer: Medicare Other | Admitting: Physical Therapy

## 2018-04-26 ENCOUNTER — Encounter: Payer: Self-pay | Admitting: Physical Therapy

## 2018-04-26 DIAGNOSIS — M6281 Muscle weakness (generalized): Secondary | ICD-10-CM

## 2018-04-26 DIAGNOSIS — R262 Difficulty in walking, not elsewhere classified: Secondary | ICD-10-CM

## 2018-04-26 DIAGNOSIS — M5442 Lumbago with sciatica, left side: Secondary | ICD-10-CM

## 2018-04-26 DIAGNOSIS — M5441 Lumbago with sciatica, right side: Secondary | ICD-10-CM

## 2018-04-26 NOTE — Therapy (Signed)
[place], [place], [address] Phone: [phone]   Fax:  [phone]  Physical Therapy Treatment  Patient Details  Name: [name] MRN: [id number] Date of Birth: 03-Dec-1932 Referring Provider (PT): Elsner   Encounter Date: 04/26/2018  PT End of Session - 04/26/18 1439    Visit Number  55    Date for PT Re-Evaluation  05/04/18    PT Start Time  1345    PT Stop Time  1432    PT Time Calculation (min)  47 min    Activity Tolerance  Patient tolerated treatment well    Behavior During Therapy  [facility] for tasks assessed/performed       Past Medical History:  Diagnosis Date  . Arthritis   . GERD (gastroesophageal reflux disease)   . History of blood transfusion   . Hypothyroidism   . PONV (postoperative nausea and vomiting)   . Ptosis of both eyelids   . Vertigo   . Wears glasses    reading    Past Surgical History:  Procedure Laterality Date  . ABDOMINAL HYSTERECTOMY  1986  . APPLICATION OF ROBOTIC ASSISTANCE FOR SPINAL PROCEDURE N/A 07/17/2017   Procedure: APPLICATION OF ROBOTIC ASSISTANCE FOR SPINAL PROCEDURE;  Surgeon: Kristeen Miss, MD;  Location: [place];  Service: Neurosurgery;  Laterality: N/A;  . APPLICATION OF ROBOTIC ASSISTANCE FOR SPINAL PROCEDURE N/A 09/01/2017   Procedure: APPLICATION OF ROBOTIC ASSISTANCE FOR SPINAL PROCEDURE;  Surgeon: Kristeen Miss, MD;  Location: [place];  Service: Neurosurgery;  Laterality: N/A;  . [place]  2000   right  . CARPAL TUNNEL RELEASE  2012   left  . COLONOSCOPY    . CYSTOCELE REPAIR  2007   sling  . DISTAL INTERPHALANGEAL JOINT FUSION Right 02/04/2014   Procedure: FUSION DISTAL INTERPHALANGEAL JOINT RIGHT INDEX FINGER ;  Surgeon: Daryll Brod, MD;  Location: [place];  Service: Orthopedics;  Laterality: Right;  . EYE SURGERY Bilateral    Cataract surgery with lens implant  .  KNEE SURGERY  2009,2011   partial knee  . OPEN REDUCTION INTERNAL FIXATION (ORIF) PROXIMAL PHALANX Left 08/30/2013   Procedure: OPEN REDUCTION INTERNAL FIXATION (ORIF) PROXIMAL PHALANX FRACTURE LEFT SMALL FINGER; SPLINT RING FINGER;  Surgeon: Wynonia Sours, MD;  Location: [place];  Service: Orthopedics;  Laterality: Left;  . POSTERIOR LUMBAR FUSION 4 LEVEL N/A 07/17/2017   Procedure: Thoracic Ten - Lumbar Five revison of hardware with Mazor;  Surgeon: Kristeen Miss, MD;  Location: [place];  Service: Neurosurgery;  Laterality: N/A;  Thoracic/Lumbar  . PTOSIS REPAIR Bilateral 07/27/2015   Procedure: PTOSIS REPAIR;  Surgeon: Cristine Polio, MD;  Location: [place];  Service: Plastics;  Laterality: Bilateral;  . SHOULDER SURGERY     rt rcr,and lt  . TONSILLECTOMY      There were no vitals filed for this visit.  Subjective Assessment - 04/26/18 1349    Subjective  Patient reports that she is aching today, reports "it is going to rain"    Currently in Pain?  No/denies                       [facility] Adult PT Treatment/Exercise - 04/26/18 0001      Ambulation/Gait   Gait Comments  gait 280 feet with SPC and then 280' without cane and SBA, as  she fatigued , some gait outside negotiated curbs      High Level Balance   High Level Balance Activities  Side stepping;Negotiating over obstacles    High Level Balance Comments  use of a SPC over the obstacles, ball toss, ball kicks, 8" toe touches, step on and off airex, some head turns and eyes closed on the airex      Lumbar Exercises: Machines for Strengthening   Cybex Lumbar Extension  black tband 3x 15, seated Bosu behind her back to Bosu  and then up for abs    Other Lumbar Machine Exercise  seated row and lats 35# 3x10 each      Lumbar Exercises: Standing   Other Standing Lumbar Exercises  5# hip flexion and abduction, extension, 2x10 each      Lumbar Exercises: Seated   Sit to Stand  20 reps     Sit to Stand Limitations  with weighted ball and then reach overhead with the ball    Other Seated Lumbar Exercises  weighted ball side to side trunk work      Knee/Hip Exercises: Standing   Walking with Sports Cord  forward only      Knee/Hip Exercises: Seated   Other Seated Knee/Hip Exercises  toe taps and ankle inversion and eversion keeping heel on the floor               PT Short Term Goals - 10/16/17 1144      PT SHORT TERM GOAL #1   Title  independent with initial HEP    Status  Achieved        PT Long Term Goals - 04/26/18 1443      PT LONG TERM GOAL #3   Title  increase LE strength to 4+/5    Status  Partially Met      PT LONG TERM GOAL #4   Title  go up and down stairs step over step    Status  Achieved      PT LONG TERM GOAL #5   Title  walk 600 feet without assistive device    Status  Partially Met            Plan - 04/26/18 1439    Clinical Impression Statement  Pateint now walking all in the house with a SPC or without anything.  She is reporting that she is more confident but still hesitant with her walking.  She was able to negotiate curbs without difficulty.  She is very fearful of balance and expecially on the airex she is fearful, she does want to do some yardwork when the spring comes    PT Next Visit Plan  work on her balance and confidence in walking outside    [employer] and Agree with Plan of Care  Patient       Patient will benefit from skilled therapeutic intervention in order to improve the following deficits and impairments:  Abnormal gait, Cardiopulmonary status limiting activity, Decreased activity tolerance, Decreased balance, Decreased mobility, Decreased strength, Postural dysfunction, Improper body mechanics, Impaired flexibility, Pain, Increased muscle spasms, Decreased range of motion, Difficulty walking  Visit Diagnosis: Muscle weakness (generalized)  Acute bilateral low back pain with bilateral sciatica  Difficulty in  walking, not elsewhere classified     Problem List Patient Active Problem List   Diagnosis Date Noted  . Osteoarthritis of right knee   . Vertigo   . DDD (degenerative disc disease), cervical   . Benign essential HTN   .  History of DVT (deep vein thrombosis)   . Tachycardia   . Steroid-induced hyperglycemia   . Hypothyroidism   . Neuropathic pain   . Acute blood loss anemia   . S/P lumbar fusion   . Closed L5 vertebral fracture ([place]) 08/31/2017  . Closed fracture of fifth lumbar vertebra ([place]) 08/30/2017  . Lumbar vertebral fracture ([place]) 07/17/2017  . Scoliosis 06/13/2017    Sumner Boast., PT 04/26/2018, 2:44 PM  [place] [address] [place], [place], [address] Phone: [phone]   Fax:  [phone]  Name: [name] MRN: [id number] Date of Birth: 03-04-1933

## 2018-05-01 ENCOUNTER — Ambulatory Visit: Payer: Medicare Other | Admitting: Physical Therapy

## 2018-05-01 ENCOUNTER — Encounter: Payer: Self-pay | Admitting: Physical Therapy

## 2018-05-01 DIAGNOSIS — R262 Difficulty in walking, not elsewhere classified: Secondary | ICD-10-CM

## 2018-05-01 DIAGNOSIS — M5441 Lumbago with sciatica, right side: Secondary | ICD-10-CM

## 2018-05-01 DIAGNOSIS — M5442 Lumbago with sciatica, left side: Secondary | ICD-10-CM

## 2018-05-01 DIAGNOSIS — M6281 Muscle weakness (generalized): Secondary | ICD-10-CM | POA: Diagnosis not present

## 2018-05-01 NOTE — Therapy (Signed)
[place], [place], [address] Phone: [phone]   Fax:  [phone]  Physical Therapy Treatment  Patient Details  Name: [name] MRN: [id number] Date of Birth: 03-26-1933 Referring Provider (PT): Elsner   Encounter Date: 05/01/2018  PT End of Session - 05/01/18 1349    Visit Number  23    Date for PT Re-Evaluation  05/04/18    PT Start Time  1258    PT Stop Time  1346    PT Time Calculation (min)  48 min    Activity Tolerance  Patient tolerated treatment well    Behavior During Therapy  [facility] for tasks assessed/performed       Past Medical History:  Diagnosis Date  . Arthritis   . GERD (gastroesophageal reflux disease)   . History of blood transfusion   . Hypothyroidism   . PONV (postoperative nausea and vomiting)   . Ptosis of both eyelids   . Vertigo   . Wears glasses    reading    Past Surgical History:  Procedure Laterality Date  . ABDOMINAL HYSTERECTOMY  1986  . APPLICATION OF ROBOTIC ASSISTANCE FOR SPINAL PROCEDURE N/A 07/17/2017   Procedure: APPLICATION OF ROBOTIC ASSISTANCE FOR SPINAL PROCEDURE;  Surgeon: Kristeen Miss, MD;  Location: [place];  Service: Neurosurgery;  Laterality: N/A;  . APPLICATION OF ROBOTIC ASSISTANCE FOR SPINAL PROCEDURE N/A 09/01/2017   Procedure: APPLICATION OF ROBOTIC ASSISTANCE FOR SPINAL PROCEDURE;  Surgeon: Kristeen Miss, MD;  Location: [place];  Service: Neurosurgery;  Laterality: N/A;  . [place]  2000   right  . CARPAL TUNNEL RELEASE  2012   left  . COLONOSCOPY    . CYSTOCELE REPAIR  2007   sling  . DISTAL INTERPHALANGEAL JOINT FUSION Right 02/04/2014   Procedure: FUSION DISTAL INTERPHALANGEAL JOINT RIGHT INDEX FINGER ;  Surgeon: Daryll Brod, MD;  Location: [place];  Service: Orthopedics;  Laterality: Right;  . EYE SURGERY Bilateral    Cataract surgery with lens implant  .  KNEE SURGERY  2009,2011   partial knee  . OPEN REDUCTION INTERNAL FIXATION (ORIF) PROXIMAL PHALANX Left 08/30/2013   Procedure: OPEN REDUCTION INTERNAL FIXATION (ORIF) PROXIMAL PHALANX FRACTURE LEFT SMALL FINGER; SPLINT RING FINGER;  Surgeon: Wynonia Sours, MD;  Location: [place];  Service: Orthopedics;  Laterality: Left;  . POSTERIOR LUMBAR FUSION 4 LEVEL N/A 07/17/2017   Procedure: Thoracic Ten - Lumbar Five revison of hardware with Mazor;  Surgeon: Kristeen Miss, MD;  Location: [place];  Service: Neurosurgery;  Laterality: N/A;  Thoracic/Lumbar  . PTOSIS REPAIR Bilateral 07/27/2015   Procedure: PTOSIS REPAIR;  Surgeon: Cristine Polio, MD;  Location: [place];  Service: Plastics;  Laterality: Bilateral;  . SHOULDER SURGERY     rt rcr,and lt  . TONSILLECTOMY      There were no vitals filed for this visit.  Subjective Assessment - 05/01/18 1305    Subjective  Patient reports that she is feeling better, less achey, moving a little better per observation                       [facility] Adult PT Treatment/Exercise - 05/01/18 0001      Ambulation/Gait   Gait Comments  all gait int he clinic was with [facility], walked with around one entire lap around the building with [facility]  and supervision, She did well until going up the hill, she got a little short of breath and caught foot x 3, she was able to correct and right with supervision and light CGA at the end of the walk.      High Level Balance   High Level Balance Activities  Side stepping;Direction changes    High Level Balance Comments  ball kicks, ball toss, walking head turns and attempted ball toss but this was too much for her to do      Lumbar Exercises: Standing   Other Standing Lumbar Exercises  5# hip flexion and abduction, extension, 2x10 each      Lumbar Exercises: Seated   Sit to Stand  20 reps    Sit to Stand Limitations  with weighted ball and then reach overhead with the ball       Knee/Hip Exercises: Standing   Walking with Sports Cord  30# total on the pulley fwd and backward with min A and a lot of cues               PT Short Term Goals - 10/16/17 1144      PT SHORT TERM GOAL #1   Title  independent with initial HEP    Status  Achieved        PT Long Term Goals - 04/26/18 1443      PT LONG TERM GOAL #3   Title  increase LE strength to 4+/5    Status  Partially Met      PT LONG TERM GOAL #4   Title  go up and down stairs step over step    Status  Achieved      PT LONG TERM GOAL #5   Title  walk 600 feet without assistive device    Status  Partially Met            Plan - 05/01/18 1349    Clinical Impression Statement  Patient did well this was her fartherest walk, she had difficulty on the hill, got short of breath, then caught foot x3 .  with the ball kicks she has difficulty shifting weight to the bad side and then trusting it enough to pick up the left foot and kick ball    PT Next Visit Plan  write renewal    Consulted and Agree with Plan of Care  Patient       Patient will benefit from skilled therapeutic intervention in order to improve the following deficits and impairments:  Abnormal gait, Cardiopulmonary status limiting activity, Decreased activity tolerance, Decreased balance, Decreased mobility, Decreased strength, Postural dysfunction, Improper body mechanics, Impaired flexibility, Pain, Increased muscle spasms, Decreased range of motion, Difficulty walking  Visit Diagnosis: Muscle weakness (generalized)  Acute bilateral low back pain with bilateral sciatica  Difficulty in walking, not elsewhere classified     Problem List Patient Active Problem List   Diagnosis Date Noted  . Osteoarthritis of right knee   . Vertigo   . DDD (degenerative disc disease), cervical   . Benign essential HTN   . History of DVT (deep vein thrombosis)   . Tachycardia   . Steroid-induced hyperglycemia   . Hypothyroidism   .  Neuropathic pain   . Acute blood loss anemia   . S/P lumbar fusion   . Closed L5 vertebral fracture ([place]) 08/31/2017  . Closed fracture of fifth lumbar vertebra ([place]) 08/30/2017  . Lumbar vertebral fracture ([place]) 07/17/2017  . Scoliosis 06/13/2017  Sumner Boast., PT 05/01/2018, 1:51 PM  [place] [address] [address] [place], [place], [address] Phone: [phone]   Fax:  [phone]  Name: [name] MRN: [id number] Date of Birth: [birth year]

## 2018-05-03 ENCOUNTER — Ambulatory Visit: Payer: Medicare Other | Admitting: Physical Therapy

## 2018-05-03 ENCOUNTER — Encounter: Payer: Self-pay | Admitting: Physical Therapy

## 2018-05-03 DIAGNOSIS — M6281 Muscle weakness (generalized): Secondary | ICD-10-CM

## 2018-05-03 DIAGNOSIS — M5442 Lumbago with sciatica, left side: Secondary | ICD-10-CM

## 2018-05-03 DIAGNOSIS — R262 Difficulty in walking, not elsewhere classified: Secondary | ICD-10-CM

## 2018-05-03 DIAGNOSIS — M62830 Muscle spasm of back: Secondary | ICD-10-CM

## 2018-05-03 DIAGNOSIS — M5441 Lumbago with sciatica, right side: Secondary | ICD-10-CM

## 2018-05-03 NOTE — Therapy (Signed)
[place] [place], [place], [address] Phone: [phone]   Fax:  [phone]  Physical Therapy Treatment  Patient Details  Name: [name] MRN: [id number] Date of Birth: 1932-12-26 Referring Provider (PT): Elsner   Encounter Date: 05/03/2018  PT End of Session - 05/03/18 2683    Visit Number  25    Date for PT Re-Evaluation  06/01/18    PT Start Time  1345    PT Stop Time  1433    PT Time Calculation (min)  48 min    Activity Tolerance  Patient tolerated treatment well    Behavior During Therapy  [facility] for tasks assessed/performed       Past Medical History:  Diagnosis Date  . Arthritis   . GERD (gastroesophageal reflux disease)   . History of blood transfusion   . Hypothyroidism   . PONV (postoperative nausea and vomiting)   . Ptosis of both eyelids   . Vertigo   . Wears glasses    reading    Past Surgical History:  Procedure Laterality Date  . ABDOMINAL HYSTERECTOMY  1986  . APPLICATION OF ROBOTIC ASSISTANCE FOR SPINAL PROCEDURE N/A 07/17/2017   Procedure: APPLICATION OF ROBOTIC ASSISTANCE FOR SPINAL PROCEDURE;  Surgeon: Kristeen Miss, MD;  Location: [place];  Service: Neurosurgery;  Laterality: N/A;  . APPLICATION OF ROBOTIC ASSISTANCE FOR SPINAL PROCEDURE N/A 09/01/2017   Procedure: APPLICATION OF ROBOTIC ASSISTANCE FOR SPINAL PROCEDURE;  Surgeon: Kristeen Miss, MD;  Location: [place];  Service: Neurosurgery;  Laterality: N/A;  . [place]  2000   right  . CARPAL TUNNEL RELEASE  2012   left  . COLONOSCOPY    . CYSTOCELE REPAIR  2007   sling  . DISTAL INTERPHALANGEAL JOINT FUSION Right 02/04/2014   Procedure: FUSION DISTAL INTERPHALANGEAL JOINT RIGHT INDEX FINGER ;  Surgeon: Daryll Brod, MD;  Location: [place];  Service: Orthopedics;  Laterality: Right;  . EYE SURGERY Bilateral    Cataract surgery with lens implant  .  KNEE SURGERY  2009,2011   partial knee  . OPEN REDUCTION INTERNAL FIXATION (ORIF) PROXIMAL PHALANX Left 08/30/2013   Procedure: OPEN REDUCTION INTERNAL FIXATION (ORIF) PROXIMAL PHALANX FRACTURE LEFT SMALL FINGER; SPLINT RING FINGER;  Surgeon: Wynonia Sours, MD;  Location: [place];  Service: Orthopedics;  Laterality: Left;  . POSTERIOR LUMBAR FUSION 4 LEVEL N/A 07/17/2017   Procedure: Thoracic Ten - Lumbar Five revison of hardware with Mazor;  Surgeon: Kristeen Miss, MD;  Location: [place];  Service: Neurosurgery;  Laterality: N/A;  Thoracic/Lumbar  . PTOSIS REPAIR Bilateral 07/27/2015   Procedure: PTOSIS REPAIR;  Surgeon: Cristine Polio, MD;  Location: [place];  Service: Plastics;  Laterality: Bilateral;  . SHOULDER SURGERY     rt rcr,and lt  . TONSILLECTOMY      There were no vitals filed for this visit.  Subjective Assessment - 05/03/18 1351    Subjective  Patient reports that she was a little sore and tired after the walking    Currently in Pain?  Yes    Pain Score  2     Pain Location  Back    Aggravating Factors   walking around building                       [facility] Adult PT Treatment/Exercise - 05/03/18  0001      High Level Balance   High Level Balance Comments  use of SPC (one and two) on airex, stepping over small object side to side, airex toe touches on 6" step, airex marching, weight shifts      Lumbar Exercises: Aerobic   Recumbent Bike  level 2 x 6 minutes      Lumbar Exercises: Machines for Strengthening   Cybex Lumbar Extension  black tband 3x 15, seated Bosu behind her back to Bosu  and then up for abs    Other Lumbar Machine Exercise  seated row and lats 45# 3x10 each    Other Lumbar Machine Exercise  10# straight arm pulls with cues for posture, 25# triceps      Lumbar Exercises: Standing   Other Standing Lumbar Exercises  5# hip flexion and abduction, extension, 2x10 each      Lumbar Exercises: Seated   Other  Seated Lumbar Exercises  seated toe taps, eversion               PT Short Term Goals - 10/16/17 1144      PT SHORT TERM GOAL #1   Title  independent with initial HEP    Status  Achieved        PT Long Term Goals - 05/03/18 1540      PT LONG TERM GOAL #1   Title  decrease pain 50%    Status  Achieved      PT LONG TERM GOAL #2   Title  increase Lumbar ROM 25%    Status  Achieved      PT LONG TERM GOAL #3   Title  increase LE strength to 4+/5    Status  Partially Met      PT LONG TERM GOAL #4   Title  go up and down stairs step over step    Status  Achieved      PT LONG TERM GOAL #5   Title  walk 600 feet without assistive device    Status  Partially Met            Plan - 05/03/18 1532    Clinical Impression Statement  Over the past period patient has seemed to become more confident in her walking, reports that she is walking in the house with either a cane or without any device.  She has been able to walk with me around the outside of the building >400 feet with SPC, she does need cues for posture and when she fatigues her right toe does catch.  She has been very fearful of the airex, but today she was able to do with the [facility] and seemed to have more confidence, she also this period began to drive some.  The strength and function are coming, fear is an issue and the foot drop.    PT Frequency  2x / week    PT Duration  4 weeks    PT Treatment/Interventions  ADLs/Self Care Home Management;Cryotherapy;Electrical Stimulation;Moist Heat;Gait training;Functional mobility training;Therapeutic activities;Stair training;Therapeutic exercise;Balance training;Neuromuscular re-education;Manual techniques;Patient/family education    PT Next Visit Plan  over the next period will work on finalizing her HEP or return to a gym program and her safety with gait    Consulted and Agree with Plan of Care  Patient       Patient will benefit from skilled therapeutic intervention in  order to improve the following deficits and impairments:  Abnormal gait, Cardiopulmonary status limiting activity, Decreased  activity tolerance, Decreased balance, Decreased mobility, Decreased strength, Postural dysfunction, Improper body mechanics, Impaired flexibility, Pain, Increased muscle spasms, Decreased range of motion, Difficulty walking  Visit Diagnosis: Muscle weakness (generalized) - Plan: PT plan of care cert/re-cert  Acute bilateral low back pain with bilateral sciatica - Plan: PT plan of care cert/re-cert  Difficulty in walking, not elsewhere classified - Plan: PT plan of care cert/re-cert  Muscle spasm of back - Plan: PT plan of care cert/re-cert     Problem List Patient Active Problem List   Diagnosis Date Noted  . Osteoarthritis of right knee   . Vertigo   . DDD (degenerative disc disease), cervical   . Benign essential HTN   . History of DVT (deep vein thrombosis)   . Tachycardia   . Steroid-induced hyperglycemia   . Hypothyroidism   . Neuropathic pain   . Acute blood loss anemia   . S/P lumbar fusion   . Closed L5 vertebral fracture ([place]) 08/31/2017  . Closed fracture of fifth lumbar vertebra ([place]) 08/30/2017  . Lumbar vertebral fracture ([place]) 07/17/2017  . Scoliosis 06/13/2017    Sumner Boast., PT 05/03/2018, 3:42 PM  [place] [address] [place], [place], [address] Phone: [phone]   Fax:  [phone]  Name: [name] MRN: [id number] Date of Birth: 06-28-1932

## 2018-05-08 ENCOUNTER — Encounter: Payer: Self-pay | Admitting: Physical Therapy

## 2018-05-08 ENCOUNTER — Ambulatory Visit: Payer: Medicare Other | Attending: Internal Medicine | Admitting: Physical Therapy

## 2018-05-08 DIAGNOSIS — M62830 Muscle spasm of back: Secondary | ICD-10-CM

## 2018-05-08 DIAGNOSIS — M5442 Lumbago with sciatica, left side: Secondary | ICD-10-CM | POA: Diagnosis present

## 2018-05-08 DIAGNOSIS — R262 Difficulty in walking, not elsewhere classified: Secondary | ICD-10-CM | POA: Diagnosis present

## 2018-05-08 DIAGNOSIS — M5441 Lumbago with sciatica, right side: Secondary | ICD-10-CM | POA: Diagnosis present

## 2018-05-08 DIAGNOSIS — M6281 Muscle weakness (generalized): Secondary | ICD-10-CM | POA: Insufficient documentation

## 2018-05-08 NOTE — Therapy (Signed)
[place], [place], [address] Phone: [phone]   Fax:  [phone]  Physical Therapy Treatment  Patient Details  Name: SHELLY SPENSER MRN: [id number] Date of Birth: 09/15/1932 Referring Provider (PT): Elsner   Encounter Date: 05/08/2018  PT End of Session - 05/08/18 1441    Visit Number  9    Date for PT Re-Evaluation  06/01/18    PT Start Time  1346    PT Stop Time  1431    PT Time Calculation (min)  45 min    Activity Tolerance  Patient tolerated treatment well    Behavior During Therapy  [facility] for tasks assessed/performed       Past Medical History:  Diagnosis Date  . Arthritis   . GERD (gastroesophageal reflux disease)   . History of blood transfusion   . Hypothyroidism   . PONV (postoperative nausea and vomiting)   . Ptosis of both eyelids   . Vertigo   . Wears glasses    reading    Past Surgical History:  Procedure Laterality Date  . ABDOMINAL HYSTERECTOMY  1986  . APPLICATION OF ROBOTIC ASSISTANCE FOR SPINAL PROCEDURE N/A 07/17/2017   Procedure: APPLICATION OF ROBOTIC ASSISTANCE FOR SPINAL PROCEDURE;  Surgeon: Kristeen Miss, MD;  Location: [place];  Service: Neurosurgery;  Laterality: N/A;  . APPLICATION OF ROBOTIC ASSISTANCE FOR SPINAL PROCEDURE N/A 09/01/2017   Procedure: APPLICATION OF ROBOTIC ASSISTANCE FOR SPINAL PROCEDURE;  Surgeon: Kristeen Miss, MD;  Location: [place];  Service: Neurosurgery;  Laterality: N/A;  . [place]  2000   right  . CARPAL TUNNEL RELEASE  2012   left  . COLONOSCOPY    . CYSTOCELE REPAIR  2007   sling  . DISTAL INTERPHALANGEAL JOINT FUSION Right 02/04/2014   Procedure: FUSION DISTAL INTERPHALANGEAL JOINT RIGHT INDEX FINGER ;  Surgeon: Daryll Brod, MD;  Location: [place];  Service: Orthopedics;  Laterality: Right;  . EYE SURGERY Bilateral    Cataract surgery with lens implant  .  KNEE SURGERY  2009,2011   partial knee  . OPEN REDUCTION INTERNAL FIXATION (ORIF) PROXIMAL PHALANX Left 08/30/2013   Procedure: OPEN REDUCTION INTERNAL FIXATION (ORIF) PROXIMAL PHALANX FRACTURE LEFT SMALL FINGER; SPLINT RING FINGER;  Surgeon: Wynonia Sours, MD;  Location: [place];  Service: Orthopedics;  Laterality: Left;  . POSTERIOR LUMBAR FUSION 4 LEVEL N/A 07/17/2017   Procedure: Thoracic Ten - Lumbar Five revison of hardware with Mazor;  Surgeon: Kristeen Miss, MD;  Location: [place];  Service: Neurosurgery;  Laterality: N/A;  Thoracic/Lumbar  . PTOSIS REPAIR Bilateral 07/27/2015   Procedure: PTOSIS REPAIR;  Surgeon: Cristine Polio, MD;  Location: [place];  Service: Plastics;  Laterality: Bilateral;  . SHOULDER SURGERY     rt rcr,and lt  . TONSILLECTOMY      There were no vitals filed for this visit.  Subjective Assessment - 05/08/18 1402    Subjective  Patient reports some mid to upper back pain Suday and yesterday, she is very tender in the rhomboid mms and they feel like they are in spasm    Currently in Pain?  Yes    Pain Score  6     Pain Location  Back    Pain Orientation  Mid;Upper    Pain Descriptors / Indicators  Aching;Spasm    Aggravating Factors   walking  around building                       [facility] Adult PT Treatment/Exercise - 05/08/18 0001      Ambulation/Gait   Gait Comments  all gait in clinc with SPC, gait outside around building up and down slopes, mild short of breath.      High Level Balance   High Level Balance Activities  Negotiating over obstacles    High Level Balance Comments  use of SPC (one and two) on airex, stepping over small object side to side, airex toe touches on 6" step, airex marching, weight shifts      Lumbar Exercises: Aerobic   Recumbent Bike  level 2 x 6 minutes      Lumbar Exercises: Machines for Strengthening   Cybex Knee Extension  5# right only 2x10    Cybex Knee Flexion  20# right  only 2x10    Other Lumbar Machine Exercise  yellow tband all ankle motions      Lumbar Exercises: Standing   Other Standing Lumbar Exercises  5# hip flexion and abduction, extension, 2x10 each      Lumbar Exercises: Seated   Other Seated Lumbar Exercises  tried some active trunk motions to get her moving and decrease her mid back pain               PT Short Term Goals - 10/16/17 1144      PT SHORT TERM GOAL #1   Title  independent with initial HEP    Status  Achieved        PT Long Term Goals - 05/03/18 1540      PT LONG TERM GOAL #1   Title  decrease pain 50%    Status  Achieved      PT LONG TERM GOAL #2   Title  increase Lumbar ROM 25%    Status  Achieved      PT LONG TERM GOAL #3   Title  increase LE strength to 4+/5    Status  Partially Met      PT LONG TERM GOAL #4   Title  go up and down stairs step over step    Status  Achieved      PT LONG TERM GOAL #5   Title  walk 600 feet without assistive device    Status  Partially Met            Plan - 05/08/18 1442    Clinical Impression Statement  Patient with increased c/o mid back pain, she has significant spasms in the right rhomboid area.  She is very tender to this area.  As she fatigues she has the foot drag.      PT Next Visit Plan  if she still has pain and spasms next visit may need to address    Consulted and Agree with Plan of Care  Patient       Patient will benefit from skilled therapeutic intervention in order to improve the following deficits and impairments:  Abnormal gait, Cardiopulmonary status limiting activity, Decreased activity tolerance, Decreased balance, Decreased mobility, Decreased strength, Postural dysfunction, Improper body mechanics, Impaired flexibility, Pain, Increased muscle spasms, Decreased range of motion, Difficulty walking  Visit Diagnosis: Muscle weakness (generalized)  Acute bilateral low back pain with bilateral sciatica  Difficulty in walking, not  elsewhere classified  Muscle spasm of back     Problem List Patient Active Problem List   Diagnosis  Date Noted  . Osteoarthritis of right knee   . Vertigo   . DDD (degenerative disc disease), cervical   . Benign essential HTN   . History of DVT (deep vein thrombosis)   . Tachycardia   . Steroid-induced hyperglycemia   . Hypothyroidism   . Neuropathic pain   . Acute blood loss anemia   . S/P lumbar fusion   . Closed L5 vertebral fracture ([place]) 08/31/2017  . Closed fracture of fifth lumbar vertebra ([place]) 08/30/2017  . Lumbar vertebral fracture ([place]) 07/17/2017  . Scoliosis 06/13/2017    Sumner Boast., PT 05/08/2018, 2:43 PM  [place] [address] [place], [place], [address] Phone: [phone]   Fax:  [phone]  Name: VERANIA SALBERG MRN: [id number] Date of Birth: November 05, 1932

## 2018-05-10 ENCOUNTER — Ambulatory Visit: Payer: Medicare Other | Admitting: Physical Therapy

## 2018-05-10 ENCOUNTER — Encounter: Payer: Self-pay | Admitting: Physical Therapy

## 2018-05-10 DIAGNOSIS — M5441 Lumbago with sciatica, right side: Secondary | ICD-10-CM

## 2018-05-10 DIAGNOSIS — R262 Difficulty in walking, not elsewhere classified: Secondary | ICD-10-CM

## 2018-05-10 DIAGNOSIS — M6281 Muscle weakness (generalized): Secondary | ICD-10-CM

## 2018-05-10 DIAGNOSIS — M62830 Muscle spasm of back: Secondary | ICD-10-CM

## 2018-05-10 DIAGNOSIS — M5442 Lumbago with sciatica, left side: Secondary | ICD-10-CM

## 2018-05-10 NOTE — Therapy (Signed)
[place], [place], [address] Phone: [phone]   Fax:  [phone]  Physical Therapy Treatment  Patient Details  Name: [name] MRN: [id number] Date of Birth: [birth year] Referring Provider (PT): Elsner   Encounter Date: 05/10/2018  PT End of Session - 05/10/18 1636    Visit Number  44    Date for PT Re-Evaluation  06/01/18    PT Start Time  0814    PT Stop Time  1605    PT Time Calculation (min)  50 min    Activity Tolerance  Patient tolerated treatment well    Behavior During Therapy  [facility] for tasks assessed/performed       Past Medical History:  Diagnosis Date  . Arthritis   . GERD (gastroesophageal reflux disease)   . History of blood transfusion   . Hypothyroidism   . PONV (postoperative nausea and vomiting)   . Ptosis of both eyelids   . Vertigo   . Wears glasses    reading    Past Surgical History:  Procedure Laterality Date  . ABDOMINAL HYSTERECTOMY  1986  . APPLICATION OF ROBOTIC ASSISTANCE FOR SPINAL PROCEDURE N/A 07/17/2017   Procedure: APPLICATION OF ROBOTIC ASSISTANCE FOR SPINAL PROCEDURE;  Surgeon: Kristeen Miss, MD;  Location: [place];  Service: Neurosurgery;  Laterality: N/A;  . APPLICATION OF ROBOTIC ASSISTANCE FOR SPINAL PROCEDURE N/A 09/01/2017   Procedure: APPLICATION OF ROBOTIC ASSISTANCE FOR SPINAL PROCEDURE;  Surgeon: Kristeen Miss, MD;  Location: [place];  Service: Neurosurgery;  Laterality: N/A;  . [place]  2000   right  . CARPAL TUNNEL RELEASE  2012   left  . COLONOSCOPY    . CYSTOCELE REPAIR  2007   sling  . DISTAL INTERPHALANGEAL JOINT FUSION Right 02/04/2014   Procedure: FUSION DISTAL INTERPHALANGEAL JOINT RIGHT INDEX FINGER ;  Surgeon: Daryll Brod, MD;  Location: [place];  Service: Orthopedics;  Laterality: Right;  . EYE SURGERY Bilateral    Cataract surgery with lens implant  .  KNEE SURGERY  2009,2011   partial knee  . OPEN REDUCTION INTERNAL FIXATION (ORIF) PROXIMAL PHALANX Left 08/30/2013   Procedure: OPEN REDUCTION INTERNAL FIXATION (ORIF) PROXIMAL PHALANX FRACTURE LEFT SMALL FINGER; SPLINT RING FINGER;  Surgeon: Wynonia Sours, MD;  Location: [place];  Service: Orthopedics;  Laterality: Left;  . POSTERIOR LUMBAR FUSION 4 LEVEL N/A 07/17/2017   Procedure: Thoracic Ten - Lumbar Five revison of hardware with Mazor;  Surgeon: Kristeen Miss, MD;  Location: [place];  Service: Neurosurgery;  Laterality: N/A;  Thoracic/Lumbar  . PTOSIS REPAIR Bilateral 07/27/2015   Procedure: PTOSIS REPAIR;  Surgeon: Cristine Polio, MD;  Location: [place];  Service: Plastics;  Laterality: Bilateral;  . SHOULDER SURGERY     rt rcr,and lt  . TONSILLECTOMY      There were no vitals filed for this visit.  Subjective Assessment - 05/10/18 1515    Subjective  Patient reports that the back pain was very bad yesterday "sore as a boil"    Currently in Pain?  Yes    Pain Score  6     Pain Location  Back    Pain Orientation  Mid;Lower    Pain Descriptors / Indicators  Spasm    Pain Relieving Factors  heat has helped  [place] Adult PT Treatment/Exercise - 05/10/18 0001      Ambulation/Gait   Gait Comments  gait with SPC 2x 200 feet education on safety      Lumbar Exercises: Aerobic   Recumbent Bike  level 2 x 6 minutes      Lumbar Exercises: Machines for Strengthening   Cybex Knee Flexion  25# right only 2x10      Knee/Hip Exercises: Standing   Hip Abduction  Both;2 sets;10 reps    Abduction Limitations  5#    Hip Extension  Both;2 sets;10 reps    Extension Limitations  5#    Walking with Sports Cord  30# total on the pulley fwd and backward with CGA and a lot of cues    Other Standing Knee Exercises  sit to stand no UE holding weighted ball sets of 5 from 3 different seat heights      Modalities   Modalities   Moist Heat;Electrical Stimulation      Moist Heat Therapy   Number Minutes Moist Heat  10 Minutes    Moist Heat Location  Lumbar Spine      Electrical Stimulation   Electrical Stimulation Location  low back    Electrical Stimulation Action  IFC    Electrical Stimulation Parameters  sitting    Electrical Stimulation Goals  Pain               PT Short Term Goals - 10/16/17 1144      PT SHORT TERM GOAL #1   Title  independent with initial HEP    Status  Achieved        PT Long Term Goals - 05/10/18 1646      PT LONG TERM GOAL #3   Title  increase LE strength to 4+/5    Status  Partially Met      PT LONG TERM GOAL #5   Title  walk 600 feet without assistive device    Status  Partially Met            Plan - 05/10/18 1639    Clinical Impression Statement  Patient continued to report increaesd LBP she has increased tightness and spasms, she is very tender.  We tried the estim to see if this would help decrease the pain.  She seems to have increased pain with walking and then will have the fatigue.  I started talking with her about the independent gym and trying to get her to understand how to adjust the machines    PT Next Visit Plan  see if the spasms are better, continue to educate on teh machines for when she is D/C'd    Consulted and Agree with Plan of Care  Patient       Patient will benefit from skilled therapeutic intervention in order to improve the following deficits and impairments:  Abnormal gait, Cardiopulmonary status limiting activity, Decreased activity tolerance, Decreased balance, Decreased mobility, Decreased strength, Postural dysfunction, Improper body mechanics, Impaired flexibility, Pain, Increased muscle spasms, Decreased range of motion, Difficulty walking  Visit Diagnosis: Muscle weakness (generalized)  Acute bilateral low back pain with bilateral sciatica  Difficulty in walking, not elsewhere classified  Muscle spasm of  back     Problem List Patient Active Problem List   Diagnosis Date Noted  . Osteoarthritis of right knee   . Vertigo   . DDD (degenerative disc disease), cervical   . Benign essential HTN   . History of DVT (deep vein thrombosis)   .  Tachycardia   . Steroid-induced hyperglycemia   . Hypothyroidism   . Neuropathic pain   . Acute blood loss anemia   . S/P lumbar fusion   . Closed L5 vertebral fracture ([place]) 08/31/2017  . Closed fracture of fifth lumbar vertebra ([place]) 08/30/2017  . Lumbar vertebral fracture ([place]) 07/17/2017  . Scoliosis 06/13/2017    Sumner Boast., PT 05/10/2018, 4:47 PM  [place] [address] [place], [place], [address] Phone: [phone]   Fax:  [phone]  Name: [name] MRN: [id number] Date of Birth: [birth year]

## 2018-05-15 ENCOUNTER — Ambulatory Visit: Payer: Medicare Other | Admitting: Physical Therapy

## 2018-05-15 ENCOUNTER — Encounter: Payer: Self-pay | Admitting: Physical Therapy

## 2018-05-15 DIAGNOSIS — M6281 Muscle weakness (generalized): Secondary | ICD-10-CM | POA: Diagnosis not present

## 2018-05-15 DIAGNOSIS — M5441 Lumbago with sciatica, right side: Secondary | ICD-10-CM

## 2018-05-15 DIAGNOSIS — M5442 Lumbago with sciatica, left side: Secondary | ICD-10-CM

## 2018-05-15 DIAGNOSIS — M62830 Muscle spasm of back: Secondary | ICD-10-CM

## 2018-05-15 DIAGNOSIS — R262 Difficulty in walking, not elsewhere classified: Secondary | ICD-10-CM

## 2018-05-15 NOTE — Therapy (Signed)
[place], [place], [address] Phone: [phone]   Fax:  [phone] Progress Note Reporting Period 04/12/18 to 05/15/18 for visits 51-60  See note below for Objective Data and Assessment of Progress/Goals.      Physical Therapy Treatment  Patient Details  Name: [name] MRN: [id number] Date of Birth: 06/24/1932 Referring Provider (PT): Elsner   Encounter Date: 05/15/2018  PT End of Session - 05/15/18 1528    Visit Number  60    Date for PT Re-Evaluation  06/01/18    PT Start Time  1350    PT Stop Time  1435    PT Time Calculation (min)  45 min    Activity Tolerance  Patient tolerated treatment well    Behavior During Therapy  [facility] for tasks assessed/performed       Past Medical History:  Diagnosis Date  . Arthritis   . GERD (gastroesophageal reflux disease)   . History of blood transfusion   . Hypothyroidism   . PONV (postoperative nausea and vomiting)   . Ptosis of both eyelids   . Vertigo   . Wears glasses    reading    Past Surgical History:  Procedure Laterality Date  . ABDOMINAL HYSTERECTOMY  1986  . APPLICATION OF ROBOTIC ASSISTANCE FOR SPINAL PROCEDURE N/A 07/17/2017   Procedure: APPLICATION OF ROBOTIC ASSISTANCE FOR SPINAL PROCEDURE;  Surgeon: Kristeen Miss, MD;  Location: [place];  Service: Neurosurgery;  Laterality: N/A;  . APPLICATION OF ROBOTIC ASSISTANCE FOR SPINAL PROCEDURE N/A 09/01/2017   Procedure: APPLICATION OF ROBOTIC ASSISTANCE FOR SPINAL PROCEDURE;  Surgeon: Kristeen Miss, MD;  Location: [place];  Service: Neurosurgery;  Laterality: N/A;  . [place]  2000   right  . CARPAL TUNNEL RELEASE  2012   left  . COLONOSCOPY    . CYSTOCELE REPAIR  2007   sling  . DISTAL INTERPHALANGEAL JOINT FUSION Right 02/04/2014   Procedure: FUSION DISTAL INTERPHALANGEAL JOINT RIGHT INDEX FINGER ;  Surgeon: Daryll Brod, MD;   Location: [place];  Service: Orthopedics;  Laterality: Right;  . EYE SURGERY Bilateral    Cataract surgery with lens implant  . KNEE SURGERY  2009,2011   partial knee  . OPEN REDUCTION INTERNAL FIXATION (ORIF) PROXIMAL PHALANX Left 08/30/2013   Procedure: OPEN REDUCTION INTERNAL FIXATION (ORIF) PROXIMAL PHALANX FRACTURE LEFT SMALL FINGER; SPLINT RING FINGER;  Surgeon: Wynonia Sours, MD;  Location: [place];  Service: Orthopedics;  Laterality: Left;  . POSTERIOR LUMBAR FUSION 4 LEVEL N/A 07/17/2017   Procedure: Thoracic Ten - Lumbar Five revison of hardware with Mazor;  Surgeon: Kristeen Miss, MD;  Location: [place];  Service: Neurosurgery;  Laterality: N/A;  Thoracic/Lumbar  . PTOSIS REPAIR Bilateral 07/27/2015   Procedure: PTOSIS REPAIR;  Surgeon: Cristine Polio, MD;  Location: [place];  Service: Plastics;  Laterality: Bilateral;  . SHOULDER SURGERY     rt rcr,and lt  . TONSILLECTOMY      There were no vitals filed for this visit.  Subjective Assessment - 05/15/18 1402    Subjective  Patient continues to report spasms and pain in the upper back , she reports that she is unsure why    Currently in Pain?  Yes    Pain Score  6     Pain Location  Back    Pain Orientation  Mid;Upper  Aggravating Factors   unsure why the spasm and the osreness                       [facility] Adult PT Treatment/Exercise - 05/15/18 0001      Ambulation/Gait   Gait Comments  gait with SPC 4x 200 feet, she wanted to try the cane in the right hand so we worked with her on this and the foot drop      High Level Balance   High Level Balance Comments  standing on the airex marching and touching the feet on 10" and 8" steps, also standing on airex head turns and reaching      Lumbar Exercises: Machines for Strengthening   Cybex Knee Flexion  25# right only 2x10    Other Lumbar Machine Exercise  25# seated rows and lats working the upper back with  light weight to help with spasm      Lumbar Exercises: Standing   Other Standing Lumbar Exercises  calf stretches and then heel raises      Lumbar Exercises: Seated   Other Seated Lumbar Exercises  side to side elongating the ribs, shrugs, black tband back extension, physio ball in lap isometric abs    Other Seated Lumbar Exercises  bent over row 3#, bent over extension 2#      Manual Therapy   Manual Therapy  Soft tissue mobilization    Soft tissue mobilization  bilateral lumbar and thoracic area due to spasms               PT Short Term Goals - 10/16/17 1144      PT SHORT TERM GOAL #1   Title  independent with initial HEP    Status  Achieved        PT Long Term Goals - 05/10/18 1646      PT LONG TERM GOAL #3   Title  increase LE strength to 4+/5    Status  Partially Met      PT LONG TERM GOAL #5   Title  walk 600 feet without assistive device    Status  Partially Met            Plan - 05/15/18 1528    Clinical Impression Statement  Patient with increased spasms in the lower, mid and upper back still, she is unsure of why, she wants to change hands on the cane and we practiced this today and she seemd to do well, the uneven surfaces really are fearful     PT Next Visit Plan  try to calm down spasms    Consulted and Agree with Plan of Care  Patient       Patient will benefit from skilled therapeutic intervention in order to improve the following deficits and impairments:  Abnormal gait, Cardiopulmonary status limiting activity, Decreased activity tolerance, Decreased balance, Decreased mobility, Decreased strength, Postural dysfunction, Improper body mechanics, Impaired flexibility, Pain, Increased muscle spasms, Decreased range of motion, Difficulty walking  Visit Diagnosis: Muscle weakness (generalized)  Acute bilateral low back pain with bilateral sciatica  Difficulty in walking, not elsewhere classified  Muscle spasm of back     Problem  List Patient Active Problem List   Diagnosis Date Noted  . Osteoarthritis of right knee   . Vertigo   . DDD (degenerative disc disease), cervical   . Benign essential HTN   . History of DVT (deep vein thrombosis)   . Tachycardia   . Steroid-induced  hyperglycemia   . Hypothyroidism   . Neuropathic pain   . Acute blood loss anemia   . S/P lumbar fusion   . Closed L5 vertebral fracture ([place]) 08/31/2017  . Closed fracture of fifth lumbar vertebra ([place]) 08/30/2017  . Lumbar vertebral fracture ([place]) 07/17/2017  . Scoliosis 06/13/2017    Sumner Boast., PT 05/15/2018, 3:30 PM  [place] [address] [place], [place], [address] Phone: [phone]   Fax:  [phone]  Name: [name] MRN: [id number] Date of Birth: [birth year]

## 2018-05-17 ENCOUNTER — Ambulatory Visit: Payer: Medicare Other | Admitting: Physical Therapy

## 2018-05-17 ENCOUNTER — Encounter: Payer: Self-pay | Admitting: Physical Therapy

## 2018-05-17 DIAGNOSIS — M6281 Muscle weakness (generalized): Secondary | ICD-10-CM | POA: Diagnosis not present

## 2018-05-17 DIAGNOSIS — M5441 Lumbago with sciatica, right side: Secondary | ICD-10-CM

## 2018-05-17 DIAGNOSIS — M62830 Muscle spasm of back: Secondary | ICD-10-CM

## 2018-05-17 DIAGNOSIS — R262 Difficulty in walking, not elsewhere classified: Secondary | ICD-10-CM

## 2018-05-17 DIAGNOSIS — M5442 Lumbago with sciatica, left side: Secondary | ICD-10-CM

## 2018-05-17 NOTE — Therapy (Signed)
[place], [place], [address] Phone: [phone]   Fax:  [phone]  Physical Therapy Treatment  Patient Details  Name: [name] MRN: [id number] Date of Birth: 1932-04-17 Referring Provider (PT): Elsner   Encounter Date: 05/17/2018  PT End of Session - 05/17/18 1523    Visit Number  75    Date for PT Re-Evaluation  06/01/18    PT Start Time  1430    PT Stop Time  1518    PT Time Calculation (min)  48 min    Activity Tolerance  Patient limited by pain    Behavior During Therapy  [facility] for tasks assessed/performed       Past Medical History:  Diagnosis Date  . Arthritis   . GERD (gastroesophageal reflux disease)   . History of blood transfusion   . Hypothyroidism   . PONV (postoperative nausea and vomiting)   . Ptosis of both eyelids   . Vertigo   . Wears glasses    reading    Past Surgical History:  Procedure Laterality Date  . ABDOMINAL HYSTERECTOMY  1986  . APPLICATION OF ROBOTIC ASSISTANCE FOR SPINAL PROCEDURE N/A 07/17/2017   Procedure: APPLICATION OF ROBOTIC ASSISTANCE FOR SPINAL PROCEDURE;  Surgeon: Kristeen Miss, MD;  Location: [place];  Service: Neurosurgery;  Laterality: N/A;  . APPLICATION OF ROBOTIC ASSISTANCE FOR SPINAL PROCEDURE N/A 09/01/2017   Procedure: APPLICATION OF ROBOTIC ASSISTANCE FOR SPINAL PROCEDURE;  Surgeon: Kristeen Miss, MD;  Location: [place];  Service: Neurosurgery;  Laterality: N/A;  . [place]  2000   right  . CARPAL TUNNEL RELEASE  2012   left  . COLONOSCOPY    . CYSTOCELE REPAIR  2007   sling  . DISTAL INTERPHALANGEAL JOINT FUSION Right 02/04/2014   Procedure: FUSION DISTAL INTERPHALANGEAL JOINT RIGHT INDEX FINGER ;  Surgeon: Daryll Brod, MD;  Location: [place];  Service: Orthopedics;  Laterality: Right;  . EYE SURGERY Bilateral    Cataract surgery with lens implant  . KNEE  SURGERY  2009,2011   partial knee  . OPEN REDUCTION INTERNAL FIXATION (ORIF) PROXIMAL PHALANX Left 08/30/2013   Procedure: OPEN REDUCTION INTERNAL FIXATION (ORIF) PROXIMAL PHALANX FRACTURE LEFT SMALL FINGER; SPLINT RING FINGER;  Surgeon: Wynonia Sours, MD;  Location: [place];  Service: Orthopedics;  Laterality: Left;  . POSTERIOR LUMBAR FUSION 4 LEVEL N/A 07/17/2017   Procedure: Thoracic Ten - Lumbar Five revison of hardware with Mazor;  Surgeon: Kristeen Miss, MD;  Location: [place];  Service: Neurosurgery;  Laterality: N/A;  Thoracic/Lumbar  . PTOSIS REPAIR Bilateral 07/27/2015   Procedure: PTOSIS REPAIR;  Surgeon: Cristine Polio, MD;  Location: [place];  Service: Plastics;  Laterality: Bilateral;  . SHOULDER SURGERY     rt rcr,and lt  . TONSILLECTOMY      There were no vitals filed for this visit.  Subjective Assessment - 05/17/18 1442    Subjective  Patient still with spasms and reports that she has taken pain meds without much help    Currently in Pain?  Yes    Pain Score  6     Pain Location  Back    Pain Orientation  Mid;Upper                       [facility] Adult PT Treatment/Exercise - 05/17/18 0001  Ambulation/Gait   Gait Comments  gait with SPC 4x 200 feet,  she used cane in each hand and did well, no catch of the foot but still gets rounded shoulders and more stooped as she goes       Lumbar Exercises: Aerobic   Recumbent Bike  level 2 x 6 minutes    Nustep  level 2 x 6 minutes      Lumbar Exercises: Machines for Strengthening   Cybex Knee Extension  10# 2x10    Cybex Knee Flexion  25# bilaterally    Other Lumbar Machine Exercise  25# seated rows and lats working the upper back with light weight to help with spasm      Lumbar Exercises: Seated   Other Seated Lumbar Exercises  side to side elongating the ribs, shrugs, black tband back extension, physio ball in lap isometric abs    Other Seated Lumbar Exercises  bent  over row 3#, bent over extension 2#      Manual Therapy   Manual Therapy  Soft tissue mobilization    Soft tissue mobilization  bilateral lumbar and thoracic area due to spasms               PT Short Term Goals - 10/16/17 1144      PT SHORT TERM GOAL #1   Title  independent with initial HEP    Status  Achieved        PT Long Term Goals - 05/17/18 1524      PT LONG TERM GOAL #3   Title  increase LE strength to 4+/5    Status  Partially Met            Plan - 05/17/18 1523    Clinical Impression Statement  Patient continues to have increased pain, she is unsure of a cause, reports that the massage last time helped the most, she has tried heat, we tried estim, she tried different creams and is now trying a mm relaxer.  It is tight and tender but she is unsure of a reason    PT Next Visit Plan  try to calm down spasms    Consulted and Agree with Plan of Care  Patient       Patient will benefit from skilled therapeutic intervention in order to improve the following deficits and impairments:  Abnormal gait, Cardiopulmonary status limiting activity, Decreased activity tolerance, Decreased balance, Decreased mobility, Decreased strength, Postural dysfunction, Improper body mechanics, Impaired flexibility, Pain, Increased muscle spasms, Decreased range of motion, Difficulty walking  Visit Diagnosis: Muscle weakness (generalized)  Acute bilateral low back pain with bilateral sciatica  Difficulty in walking, not elsewhere classified  Muscle spasm of back     Problem List Patient Active Problem List   Diagnosis Date Noted  . Osteoarthritis of right knee   . Vertigo   . DDD (degenerative disc disease), cervical   . Benign essential HTN   . History of DVT (deep vein thrombosis)   . Tachycardia   . Steroid-induced hyperglycemia   . Hypothyroidism   . Neuropathic pain   . Acute blood loss anemia   . S/P lumbar fusion   . Closed L5 vertebral fracture ([place])  08/31/2017  . Closed fracture of fifth lumbar vertebra ([place]) 08/30/2017  . Lumbar vertebral fracture ([place]) 07/17/2017  . Scoliosis 06/13/2017    Sumner Boast., PT 05/17/2018, 3:25 PM  [place], [place], [address]  Phone: [phone]   Fax:  [phone]  Name: [name] MRN: [id number] Date of Birth: [birth year]

## 2018-05-22 ENCOUNTER — Ambulatory Visit: Payer: Medicare Other | Admitting: Physical Therapy

## 2018-05-22 ENCOUNTER — Encounter: Payer: Self-pay | Admitting: Physical Therapy

## 2018-05-22 DIAGNOSIS — M5442 Lumbago with sciatica, left side: Secondary | ICD-10-CM

## 2018-05-22 DIAGNOSIS — M6281 Muscle weakness (generalized): Secondary | ICD-10-CM

## 2018-05-22 DIAGNOSIS — R262 Difficulty in walking, not elsewhere classified: Secondary | ICD-10-CM

## 2018-05-22 DIAGNOSIS — M5441 Lumbago with sciatica, right side: Secondary | ICD-10-CM

## 2018-05-22 DIAGNOSIS — M62830 Muscle spasm of back: Secondary | ICD-10-CM

## 2018-05-22 NOTE — Therapy (Signed)
[name], [place], [address] Phone: [phone]   Fax:  [phone]  Physical Therapy Treatment  Patient Details  Name: [name] MRN: [id number] Date of Birth: 10/16/1932 Referring Provider (PT): Elsner   Encounter Date: 05/22/2018  PT End of Session - 05/22/18 1435    Visit Number  73    Date for PT Re-Evaluation  06/01/18    PT Start Time  1350    PT Stop Time  1433    PT Time Calculation (min)  43 min    Activity Tolerance  Patient limited by pain    Behavior During Therapy  [facility] for tasks assessed/performed       Past Medical History:  Diagnosis Date  . Arthritis   . GERD (gastroesophageal reflux disease)   . History of blood transfusion   . Hypothyroidism   . PONV (postoperative nausea and vomiting)   . Ptosis of both eyelids   . Vertigo   . Wears glasses    reading    Past Surgical History:  Procedure Laterality Date  . ABDOMINAL HYSTERECTOMY  1986  . APPLICATION OF ROBOTIC ASSISTANCE FOR SPINAL PROCEDURE N/A 07/17/2017   Procedure: APPLICATION OF ROBOTIC ASSISTANCE FOR SPINAL PROCEDURE;  Surgeon: Kristeen Miss, MD;  Location: [place];  Service: Neurosurgery;  Laterality: N/A;  . APPLICATION OF ROBOTIC ASSISTANCE FOR SPINAL PROCEDURE N/A 09/01/2017   Procedure: APPLICATION OF ROBOTIC ASSISTANCE FOR SPINAL PROCEDURE;  Surgeon: Kristeen Miss, MD;  Location: [place];  Service: Neurosurgery;  Laterality: N/A;  . [place]  2000   right  . CARPAL TUNNEL RELEASE  2012   left  . COLONOSCOPY    . CYSTOCELE REPAIR  2007   sling  . DISTAL INTERPHALANGEAL JOINT FUSION Right 02/04/2014   Procedure: FUSION DISTAL INTERPHALANGEAL JOINT RIGHT INDEX FINGER ;  Surgeon: Daryll Brod, MD;  Location: [place];  Service: Orthopedics;  Laterality: Right;  . EYE SURGERY Bilateral    Cataract surgery with lens implant  . KNEE  SURGERY  2009,2011   partial knee  . OPEN REDUCTION INTERNAL FIXATION (ORIF) PROXIMAL PHALANX Left 08/30/2013   Procedure: OPEN REDUCTION INTERNAL FIXATION (ORIF) PROXIMAL PHALANX FRACTURE LEFT SMALL FINGER; SPLINT RING FINGER;  Surgeon: Wynonia Sours, MD;  Location: [place];  Service: Orthopedics;  Laterality: Left;  . POSTERIOR LUMBAR FUSION 4 LEVEL N/A 07/17/2017   Procedure: Thoracic Ten - Lumbar Five revison of hardware with Mazor;  Surgeon: Kristeen Miss, MD;  Location: [place];  Service: Neurosurgery;  Laterality: N/A;  Thoracic/Lumbar  . PTOSIS REPAIR Bilateral 07/27/2015   Procedure: PTOSIS REPAIR;  Surgeon: Cristine Polio, MD;  Location: [place];  Service: Plastics;  Laterality: Bilateral;  . SHOULDER SURGERY     rt rcr,and lt  . TONSILLECTOMY      There were no vitals filed for this visit.  Subjective Assessment - 05/22/18 1358    Subjective  My back is still hurting, going to MD 06/15/18    Currently in Pain?  Yes    Pain Score  6     Pain Location  Back    Pain Orientation  Mid;Upper    Pain Descriptors / Indicators  Spasm;Sore    Aggravating Factors   sitting on couch increased pain  [facility] Adult PT Treatment/Exercise - 05/22/18 0001      Ambulation/Gait   Gait Comments  gait with SPC 2x 200 feet,  she used cane in each hand and did well, no catch of the foot but still gets rounded shoulders and more stooped as she goes , gait without any device, 150' x 2, close supervision      Lumbar Exercises: Aerobic   UBE (Upper Arm Bike)  level 3 x 4 minutes    Recumbent Bike  level 2 x 6 minutes      Lumbar Exercises: Machines for Strengthening   Cybex Knee Extension  10# 2x10    Cybex Knee Flexion  25# bilaterally      Lumbar Exercises: Seated   Sit to Stand  20 reps    Sit to Stand Limitations  with weighted ball and then reach overhead with the ball    Other Seated Lumbar Exercises  Bosu behind trunk  extension with black tband, and then crunches      Manual Therapy   Manual Therapy  Soft tissue mobilization    Soft tissue mobilization  bilateral lumbar and thoracic area due to spasms               PT Short Term Goals - 10/16/17 1144      PT SHORT TERM GOAL #1   Title  independent with initial HEP    Status  Achieved        PT Long Term Goals - 05/17/18 1524      PT LONG TERM GOAL #3   Title  increase LE strength to 4+/5    Status  Partially Met            Plan - 05/22/18 1436    Clinical Impression Statement  Patient has continued to c/o the pain, reports that she feels good when she leaves but does not carry over, it really seems to be spasms.  She did very well with the gait without device, she is hesitant and worries more than she needs to but really did well.  Talking with her about the pool exercises when we D/C her from here.    PT Next Visit Plan  try dry needles for pain/spasm    Consulted and Agree with Plan of Care  Patient       Patient will benefit from skilled therapeutic intervention in order to improve the following deficits and impairments:  Abnormal gait, Cardiopulmonary status limiting activity, Decreased activity tolerance, Decreased balance, Decreased mobility, Decreased strength, Postural dysfunction, Improper body mechanics, Impaired flexibility, Pain, Increased muscle spasms, Decreased range of motion, Difficulty walking  Visit Diagnosis: Muscle weakness (generalized)  Acute bilateral low back pain with bilateral sciatica  Difficulty in walking, not elsewhere classified  Muscle spasm of back     Problem List Patient Active Problem List   Diagnosis Date Noted  . Osteoarthritis of right knee   . Vertigo   . DDD (degenerative disc disease), cervical   . Benign essential HTN   . History of DVT (deep vein thrombosis)   . Tachycardia   . Steroid-induced hyperglycemia   . Hypothyroidism   . Neuropathic pain   . Acute blood  loss anemia   . S/P lumbar fusion   . Closed L5 vertebral fracture ([place]) 08/31/2017  . Closed fracture of fifth lumbar vertebra ([place]) 08/30/2017  . Lumbar vertebral fracture ([place]) 07/17/2017  . Scoliosis 06/13/2017    Cynthia Boast., PT 05/22/2018, 2:39 PM  [facility]  [address] [place], [place], [address] Phone: [phone]   Fax:  [phone]  Name: [name] MRN: [id number] Date of Birth: Dec 23, 1932

## 2018-05-24 ENCOUNTER — Encounter: Payer: Self-pay | Admitting: Physical Therapy

## 2018-05-24 ENCOUNTER — Ambulatory Visit: Payer: Medicare Other | Admitting: Physical Therapy

## 2018-05-24 DIAGNOSIS — M6281 Muscle weakness (generalized): Secondary | ICD-10-CM

## 2018-05-24 DIAGNOSIS — M62830 Muscle spasm of back: Secondary | ICD-10-CM

## 2018-05-24 DIAGNOSIS — M5442 Lumbago with sciatica, left side: Secondary | ICD-10-CM

## 2018-05-24 DIAGNOSIS — R262 Difficulty in walking, not elsewhere classified: Secondary | ICD-10-CM

## 2018-05-24 DIAGNOSIS — M5441 Lumbago with sciatica, right side: Secondary | ICD-10-CM

## 2018-05-24 NOTE — Therapy (Signed)
[facility] [address] [place], [place], [address] Phone: [phone]   Fax:  [phone]  Physical Therapy Treatment  Patient Details  Name: [name] MRN: [id number] Date of Birth: 07-20-1932 Referring Provider (PT): Elsner   Encounter Date: 05/24/2018  PT End of Session - 05/24/18 3838    Visit Number  58    Date for PT Re-Evaluation  06/01/18    PT Start Time  1350    PT Stop Time  1437    PT Time Calculation (min)  47 min    Activity Tolerance  Patient limited by pain    Behavior During Therapy  [facility] for tasks assessed/performed       Past Medical History:  Diagnosis Date  . Arthritis   . GERD (gastroesophageal reflux disease)   . History of blood transfusion   . Hypothyroidism   . PONV (postoperative nausea and vomiting)   . Ptosis of both eyelids   . Vertigo   . Wears glasses    reading    Past Surgical History:  Procedure Laterality Date  . ABDOMINAL HYSTERECTOMY  1986  . APPLICATION OF ROBOTIC ASSISTANCE FOR SPINAL PROCEDURE N/A 07/17/2017   Procedure: APPLICATION OF ROBOTIC ASSISTANCE FOR SPINAL PROCEDURE;  Surgeon: Kristeen Miss, MD;  Location: [place];  Service: Neurosurgery;  Laterality: N/A;  . APPLICATION OF ROBOTIC ASSISTANCE FOR SPINAL PROCEDURE N/A 09/01/2017   Procedure: APPLICATION OF ROBOTIC ASSISTANCE FOR SPINAL PROCEDURE;  Surgeon: Kristeen Miss, MD;  Location: [place];  Service: Neurosurgery;  Laterality: N/A;  . [place]  2000   right  . CARPAL TUNNEL RELEASE  2012   left  . COLONOSCOPY    . CYSTOCELE REPAIR  2007   sling  . DISTAL INTERPHALANGEAL JOINT FUSION Right 02/04/2014   Procedure: FUSION DISTAL INTERPHALANGEAL JOINT RIGHT INDEX FINGER ;  Surgeon: Daryll Brod, MD;  Location: [place];  Service: Orthopedics;  Laterality: Right;  . EYE SURGERY Bilateral    Cataract surgery with lens implant  . KNEE  SURGERY  2009,2011   partial knee  . OPEN REDUCTION INTERNAL FIXATION (ORIF) PROXIMAL PHALANX Left 08/30/2013   Procedure: OPEN REDUCTION INTERNAL FIXATION (ORIF) PROXIMAL PHALANX FRACTURE LEFT SMALL FINGER; SPLINT RING FINGER;  Surgeon: Wynonia Sours, MD;  Location: [place];  Service: Orthopedics;  Laterality: Left;  . POSTERIOR LUMBAR FUSION 4 LEVEL N/A 07/17/2017   Procedure: Thoracic Ten - Lumbar Five revison of hardware with Mazor;  Surgeon: Kristeen Miss, MD;  Location: [place];  Service: Neurosurgery;  Laterality: N/A;  Thoracic/Lumbar  . PTOSIS REPAIR Bilateral 07/27/2015   Procedure: PTOSIS REPAIR;  Surgeon: Cristine Polio, MD;  Location: [place];  Service: Plastics;  Laterality: Bilateral;  . SHOULDER SURGERY     rt rcr,and lt  . TONSILLECTOMY      There were no vitals filed for this visit.  Subjective Assessment - 05/24/18 1354    Subjective  Continues to have back pain, reports that she is feeling better about walking    Currently in Pain?  Yes    Pain Score  5     Pain Location  Back    Pain Orientation  Mid;Lower    Pain Descriptors / Indicators  Spasm;Tightness  [facility] Adult PT Treatment/Exercise - 05/24/18 0001      Ambulation/Gait   Gait Comments  without device, 3x 220', supervision, as she fatigued on the last one she needed cues for posture and to pick up the right foot      High Level Balance   High Level Balance Activities  Side stepping      Lumbar Exercises: Aerobic   Recumbent Bike  level 2 x 6 minutes      Lumbar Exercises: Machines for Strengthening   Cybex Knee Flexion  35# 2x10    Other Lumbar Machine Exercise  35# rows and lats    Other Lumbar Machine Exercise  5# hip extension and abduction with the right leg only, could not do with the left leg due to the pressure on the right knee with stance.  30# right legg press down on the pulleys      Lumbar Exercises: Supine   Other  Supine Lumbar Exercises  feet on ball K2C, trunk rotation, bridges and abdominals, DF for ankle especially rihgt, green tband ankle PF, bridge with green tband clamshells      Manual Therapy   Manual Therapy  Soft tissue mobilization    Soft tissue mobilization  bilateral lumbar and thoracic area due to spasms       Trigger Point Dry Needling - 05/24/18 1437    Consent Given?  Yes    Education Handout Provided  Yes    Muscles Treated Upper Body  Longissimus    Longissimus Response  Twitch response elicited;Palpable increased muscle length             PT Short Term Goals - 10/16/17 1144      PT SHORT TERM GOAL #1   Title  independent with initial HEP    Status  Achieved        PT Long Term Goals - 05/24/18 1451      PT LONG TERM GOAL #3   Title  increase LE strength to 4+/5    Status  Partially Met      PT LONG TERM GOAL #5   Title  walk 600 feet without assistive device    Status  Partially Met            Plan - 05/24/18 1445    Clinical Impression Statement  Tried dry needling the low back, to help with spasms and then did STM, she remains tight and tender.  She is much better with walkign without a device, however she reports that she does not feel safe with walking on uneven terrain, she does fatigue and when fatigued she stoops and at times will drag the right toe.    PT Next Visit Plan  see how the pain and spasms are. we are planning to discahrge to her going to gym or pool next week    Consulted and Agree with Plan of Care  Patient       Patient will benefit from skilled therapeutic intervention in order to improve the following deficits and impairments:  Abnormal gait, Cardiopulmonary status limiting activity, Decreased activity tolerance, Decreased balance, Decreased mobility, Decreased strength, Postural dysfunction, Improper body mechanics, Impaired flexibility, Pain, Increased muscle spasms, Decreased range of motion, Difficulty walking  Visit  Diagnosis: Muscle weakness (generalized)  Acute bilateral low back pain with bilateral sciatica  Difficulty in walking, not elsewhere classified  Muscle spasm of back     Problem List Patient Active Problem List   Diagnosis Date Noted  .  Osteoarthritis of right knee   . Vertigo   . DDD (degenerative disc disease), cervical   . Benign essential HTN   . History of DVT (deep vein thrombosis)   . Tachycardia   . Steroid-induced hyperglycemia   . Hypothyroidism   . Neuropathic pain   . Acute blood loss anemia   . S/P lumbar fusion   . Closed L5 vertebral fracture ([place]) 08/31/2017  . Closed fracture of fifth lumbar vertebra ([place]) 08/30/2017  . Lumbar vertebral fracture ([place]) 07/17/2017  . Scoliosis 06/13/2017    Sumner Boast., PT 05/24/2018, 2:53 PM  [address] [place], [place], [address] Phone: [phone]   Fax:  [phone]  Name: [name] MRN: [id number] Date of Birth: Apr 18, 1932

## 2018-05-29 ENCOUNTER — Ambulatory Visit: Payer: Medicare Other | Admitting: Physical Therapy

## 2018-05-31 ENCOUNTER — Encounter: Payer: Self-pay | Admitting: Physical Therapy

## 2018-05-31 ENCOUNTER — Ambulatory Visit: Payer: Medicare Other | Admitting: Physical Therapy

## 2018-05-31 DIAGNOSIS — M6281 Muscle weakness (generalized): Secondary | ICD-10-CM | POA: Diagnosis not present

## 2018-05-31 DIAGNOSIS — M5441 Lumbago with sciatica, right side: Secondary | ICD-10-CM

## 2018-05-31 DIAGNOSIS — R262 Difficulty in walking, not elsewhere classified: Secondary | ICD-10-CM

## 2018-05-31 DIAGNOSIS — M62830 Muscle spasm of back: Secondary | ICD-10-CM

## 2018-05-31 DIAGNOSIS — M5442 Lumbago with sciatica, left side: Secondary | ICD-10-CM

## 2018-05-31 NOTE — Therapy (Signed)
[facility] [place] [place], [place], [address] Phone: [phone]   Fax:  [phone]  Physical Therapy Treatment  Patient Details  Name: [name] MRN: [id number] Date of Birth: 03/10/1933 Referring Provider (PT): Elsner   Encounter Date: 05/31/2018  PT End of Session - 05/31/18 1434    Visit Number  60    PT Start Time  1350    PT Stop Time  1434    PT Time Calculation (min)  44 min    Activity Tolerance  Patient tolerated treatment well    Behavior During Therapy  [facility] for tasks assessed/performed       Past Medical History:  Diagnosis Date  . Arthritis   . GERD (gastroesophageal reflux disease)   . History of blood transfusion   . Hypothyroidism   . PONV (postoperative nausea and vomiting)   . Ptosis of both eyelids   . Vertigo   . Wears glasses    reading    Past Surgical History:  Procedure Laterality Date  . ABDOMINAL HYSTERECTOMY  1986  . APPLICATION OF ROBOTIC ASSISTANCE FOR SPINAL PROCEDURE N/A 07/17/2017   Procedure: APPLICATION OF ROBOTIC ASSISTANCE FOR SPINAL PROCEDURE;  Surgeon: Kristeen Miss, MD;  Location: [place];  Service: Neurosurgery;  Laterality: N/A;  . APPLICATION OF ROBOTIC ASSISTANCE FOR SPINAL PROCEDURE N/A 09/01/2017   Procedure: APPLICATION OF ROBOTIC ASSISTANCE FOR SPINAL PROCEDURE;  Surgeon: Kristeen Miss, MD;  Location: [place];  Service: Neurosurgery;  Laterality: N/A;  . [place]  2000   right  . CARPAL TUNNEL RELEASE  2012   left  . COLONOSCOPY    . CYSTOCELE REPAIR  2007   sling  . DISTAL INTERPHALANGEAL JOINT FUSION Right 02/04/2014   Procedure: FUSION DISTAL INTERPHALANGEAL JOINT RIGHT INDEX FINGER ;  Surgeon: Daryll Brod, MD;  Location: [place];  Service: Orthopedics;  Laterality: Right;  . EYE SURGERY Bilateral    Cataract surgery with lens implant  . KNEE SURGERY  2009,2011   partial knee   . OPEN REDUCTION INTERNAL FIXATION (ORIF) PROXIMAL PHALANX Left 08/30/2013   Procedure: OPEN REDUCTION INTERNAL FIXATION (ORIF) PROXIMAL PHALANX FRACTURE LEFT SMALL FINGER; SPLINT RING FINGER;  Surgeon: Wynonia Sours, MD;  Location: [place];  Service: Orthopedics;  Laterality: Left;  . POSTERIOR LUMBAR FUSION 4 LEVEL N/A 07/17/2017   Procedure: Thoracic Ten - Lumbar Five revison of hardware with Mazor;  Surgeon: Kristeen Miss, MD;  Location: [place];  Service: Neurosurgery;  Laterality: N/A;  Thoracic/Lumbar  . PTOSIS REPAIR Bilateral 07/27/2015   Procedure: PTOSIS REPAIR;  Surgeon: Cristine Polio, MD;  Location: [place];  Service: Plastics;  Laterality: Bilateral;  . SHOULDER SURGERY     rt rcr,and lt  . TONSILLECTOMY      There were no vitals filed for this visit.  Subjective Assessment - 05/31/18 1430    Subjective  Patient reports that she has had a cold and has some chest tightness and a cough.  She has continued to have the back pain and spasms, she has resumed some driving and is now walking most distances with a cane    Currently in Pain?  Yes    Pain Score  4     Pain Location  Back    Pain Orientation  Mid    Pain Descriptors / Indicators  Spasm;Tightness  Aggravating Factors   unsure of why the increased pain and spasms    Pain Relieving Factors  nothing really helped long term we tried heat, estim and massage that gave good temporary relief                       [facility] Adult PT Treatment/Exercise - 05/31/18 0001      Ambulation/Gait   Gait Comments  without device on level surfaces 220' x 2      Self-Care   Self-Care  Other Self-Care Comments    Other Self-Care Comments   education on the independent gym activities that we have recommended for her to do, went over weights, the machine adjustments and perfromed with her today with guidance and assist, she will continue to need some help with this for safety                PT Short Term Goals - 10/16/17 1144      PT SHORT TERM GOAL #1   Title  independent with initial HEP    Status  Achieved        PT Long Term Goals - 05/31/18 1436      PT LONG TERM GOAL #1   Title  decrease pain 50%    Status  Achieved      PT LONG TERM GOAL #2   Title  increase Lumbar ROM 25%    Status  Achieved      PT LONG TERM GOAL #3   Title  increase LE strength to 4+/5    Status  Achieved      PT LONG TERM GOAL #4   Title  go up and down stairs step over step    Status  Achieved      PT LONG TERM GOAL #5   Title  walk 600 feet without assistive device    Status  Partially Met            Plan - 05/31/18 1434    Clinical Impression Statement  Patient has done well overall with her ability to recover from the multiple surgeries, however she has had some ups and downs with the foot working very well and then at times especially when tired it not working, she has recently in the past month has had increased spasms and pain in the mid back, she is unsure of a reason, she has returned to driving some and is now walking with a SPC only only level surfaces    PT Next Visit Plan  will d/c as we have reached a plateau, she is to try exercises on own and I have reccomended her going to the pool    Consulted and Agree with Plan of Care  Patient       Patient will benefit from skilled therapeutic intervention in order to improve the following deficits and impairments:  Abnormal gait, Cardiopulmonary status limiting activity, Decreased activity tolerance, Decreased balance, Decreased mobility, Decreased strength, Postural dysfunction, Improper body mechanics, Impaired flexibility, Pain, Increased muscle spasms, Decreased range of motion, Difficulty walking  Visit Diagnosis: Muscle weakness (generalized)  Acute bilateral low back pain with bilateral sciatica  Difficulty in walking, not elsewhere classified  Muscle spasm of back     Problem  List Patient Active Problem List   Diagnosis Date Noted  . Osteoarthritis of right knee   . Vertigo   . DDD (degenerative disc disease), cervical   . Benign essential HTN   .  History of DVT (deep vein thrombosis)   . Tachycardia   . Steroid-induced hyperglycemia   . Hypothyroidism   . Neuropathic pain   . Acute blood loss anemia   . S/P lumbar fusion   . Closed L5 vertebral fracture ([place]) 08/31/2017  . Closed fracture of fifth lumbar vertebra ([place]) 08/30/2017  . Lumbar vertebral fracture ([place]) 07/17/2017  . Scoliosis 06/13/2017    Sumner Boast., PT 05/31/2018, 2:37 PM  [address] [place], [place], [address] Phone: [phone]   Fax:  [phone]  Name: [name] MRN: [id number] Date of Birth: 1932/06/20

## 2018-07-24 ENCOUNTER — Other Ambulatory Visit: Payer: Self-pay | Admitting: Internal Medicine

## 2018-07-24 DIAGNOSIS — R1011 Right upper quadrant pain: Secondary | ICD-10-CM

## 2018-07-27 ENCOUNTER — Ambulatory Visit
Admission: RE | Admit: 2018-07-27 | Discharge: 2018-07-27 | Disposition: A | Discharge status: Home / Self Care | Payer: Medicare Other | Source: Ambulatory Visit | Attending: Internal Medicine | Admitting: Internal Medicine

## 2018-07-27 ENCOUNTER — Other Ambulatory Visit: Payer: Self-pay

## 2018-07-27 DIAGNOSIS — R1011 Right upper quadrant pain: Secondary | ICD-10-CM

## 2018-08-02 ENCOUNTER — Other Ambulatory Visit: Payer: Self-pay | Admitting: Gastroenterology

## 2018-08-02 DIAGNOSIS — R1011 Right upper quadrant pain: Secondary | ICD-10-CM

## 2018-08-02 DIAGNOSIS — R932 Abnormal findings on diagnostic imaging of liver and biliary tract: Secondary | ICD-10-CM

## 2018-08-06 ENCOUNTER — Ambulatory Visit (HOSPITAL_COMMUNITY): Payer: Medicare Other

## 2018-08-07 ENCOUNTER — Ambulatory Visit (HOSPITAL_COMMUNITY)
Admission: RE | Admit: 2018-08-07 | Discharge: 2018-08-07 | Disposition: A | Discharge status: Home / Self Care | Payer: Medicare Other | Source: Ambulatory Visit | Attending: Gastroenterology | Admitting: Gastroenterology

## 2018-08-07 ENCOUNTER — Other Ambulatory Visit: Payer: Self-pay

## 2018-08-07 DIAGNOSIS — R932 Abnormal findings on diagnostic imaging of liver and biliary tract: Secondary | ICD-10-CM | POA: Insufficient documentation

## 2018-08-07 DIAGNOSIS — R1011 Right upper quadrant pain: Secondary | ICD-10-CM | POA: Insufficient documentation

## 2018-08-07 MED ORDER — TECHNETIUM TC 99M MEBROFENIN IV KIT
5.0000 | PACK | Freq: Once | INTRAVENOUS | Status: AC | PRN
  Administered 2018-08-07: 10:00:00 5 via INTRAVENOUS

## 2018-08-16 ENCOUNTER — Ambulatory Visit
Admission: RE | Admit: 2018-08-16 | Discharge: 2018-08-16 | Disposition: A | Discharge status: Home / Self Care | Payer: Medicare Other | Source: Ambulatory Visit | Attending: General Surgery | Admitting: General Surgery

## 2018-08-16 ENCOUNTER — Other Ambulatory Visit: Payer: Self-pay | Admitting: General Surgery

## 2018-08-16 DIAGNOSIS — R109 Unspecified abdominal pain: Secondary | ICD-10-CM

## 2018-08-16 MED ORDER — IOPAMIDOL (ISOVUE-300) INJECTION 61%
100.0000 mL | Freq: Once | INTRAVENOUS | Status: AC | PRN
  Administered 2018-08-16: 12:00:00 100 mL via INTRAVENOUS

## 2018-08-21 ENCOUNTER — Other Ambulatory Visit (HOSPITAL_COMMUNITY)
Admission: RE | Admit: 2018-08-21 | Discharge: 2018-08-21 | Disposition: A | Discharge status: Home / Self Care | Payer: Medicare Other | Source: Ambulatory Visit | Attending: General Surgery | Admitting: General Surgery

## 2018-08-21 ENCOUNTER — Other Ambulatory Visit: Payer: Self-pay | Admitting: General Surgery

## 2018-08-21 DIAGNOSIS — Z1159 Encounter for screening for other viral diseases: Secondary | ICD-10-CM | POA: Diagnosis present

## 2018-08-22 LAB — NOVEL CORONAVIRUS, NAA (HOSP ORDER, SEND-OUT TO REF LAB; TAT 18-24 HRS): SARS-CoV-2, NAA: NOT DETECTED

## 2018-08-23 ENCOUNTER — Encounter (HOSPITAL_COMMUNITY): Payer: Self-pay | Admitting: *Deleted

## 2018-08-23 ENCOUNTER — Other Ambulatory Visit: Payer: Self-pay

## 2018-08-23 NOTE — Progress Notes (Signed)
Spoke with pt for pre-op call. Pt denies cardiac history, HTN or diabetes.   Pt had Covid 19 testing done yesterday and is negative. She states she doing self guarantine. Pt instructed to drink clear liquids until 9:00 AM Friday. She voiced understanding.     Coronavirus Screening  Have you experienced the following symptoms:  Cough NO Fever (>100.31F)  NO Runny nose NO Sore throat NO Difficulty breathing/shortness of breath  NO  Have you or a family member traveled in the last 14 days and where? NO     Patient reminded that hospital visitation restrictions are in effect and the importance of the restrictions.

## 2018-08-23 NOTE — H&P (Signed)
[name] Location: [facility] Patient #: [id number] DOB: Nov 04, 1932 Married / Language: English / Race: White Female      History of Present Illness  The patient is a 85 year old female who presents with non-malignant abdominal pain. This is a 85 year old woman who returns because of persistent daily severe right upper quadrant and right flank pain. Dr. Laurence Spates, Dr. Janeice Robinson and Dr. Kristeen Miss are involved in her care.      I saw her on May 13 at which time she related a six-month history of intermittent right upper quadrant pain but this is beginning to get more severe and daily. She is nauseated but does not vomit. Constipation no diarrhea. Worse at night especially when she sleeps with her left side down. Chronic GERD well controlled with omeprazole. She was subjectively tender in the right upper quadrant saw her last time      Gallbladder ultrasound shows borderline wall thickening and subjectively a little tender but basically normal otherwise.       Hepatobiliary scan with ejection fraction is completely normal. Lab work is completely normal.       Subsequent CT scan abdomen and pelvis shows no acute intra-abdominal abnormality.      Kristeen Miss was kind enough to see her promptly, x-rayed her and felt that there was no spine or musculoskeletal source of her pain. He gave her a prescription for pain medicine and that helped some. She is back today to discuss laparoscopy and cholecystectomy which is the next step     Past history reveals 3 back surgeries a year ago. GERD well controlled. Last colonoscopy 2017 by Dr. Amedeo Plenty. Hypothyroidism. Occasional dizziness takes meclizine. No major cardiac or pulmonary disease. Social history. Married. Lives in [place]. One living child. One child died of pancreatic cancer at age 45. 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 Denies alcohol or tobacco.      We had a long discussion. Abdomen is soft but  subjectively tender in the right upper quadrant. I told her I was uncertain as to whether removing her gallbladder would resolve her pain, but that given her workup, everything else seemed to have been ruled out. Empiric cholecystectomy is a reasonable medical option at this point. Operative procedure planned is diagnostic laparoscopy to make sure that we didn't see anything else, and then proceed with cholecystectomy and possible cholangiogram. I discussed the indications, details, techniques, numerous risk of the surgery with her. She is aware of the risk of bleeding, infection, conversion to open laparotomy, bile leak, chronic diarrhea, pancreatitis, injury to adjacent organs of major reconstructive surgery. She of course is aware that this may not resolve her symptoms and further exotic workup would be necessary at that point. She understands all these issues. All of her questions were answered. She agrees with this plan. She is anxious to proceed with surgery at the earliest possible global moment. Hopefully I can do this in the next couple of days.     Diagnostic Studies History  Mammogram  1-3 years ago  Allergies  Keflex *CEPHALOSPORINS*  Allergies Reconciled   Medication History  Levothyroxine Sodium (50MCG Tablet, Oral) Active. HYDROcodone-Acetaminophen (5-325MG  Tablet, Oral) Active. Medications Reconciled  Vitals Weight: 176.6 lb Height: 66in Body Surface Area: 1.9 m Body Mass Index: 28.5 kg/m  Temp.: 98.81F(Temporal)  Pulse: 96 (Regular)  BP: 130/82 (Sitting, Left Arm, Standard)     Physical Exam  General Mental Status-Alert. General Appearance-Not in acute distress. Build & Nutrition-Well  nourished. Posture-Normal posture. Gait-Normal. Note: Alert. Frustrated. Mild distress. Good insight. BMI 28   Head and Neck Head-normocephalic, atraumatic with no lesions or palpable masses. Trachea-midline. Thyroid Gland  Characteristics - normal size and consistency and no palpable nodules.  Chest and Lung Exam Chest and lung exam reveals -on auscultation, normal breath sounds, no adventitious sounds and normal vocal resonance.  Cardiovascular Cardiovascular examination reveals -normal heart sounds, regular rate and rhythm with no murmurs and femoral artery auscultation bilaterally reveals normal pulses, no bruits, no thrills.  Abdomen Note: Tender right upper quadrant but soft. Not really guarding. No mass. No organomegaly. No hernias. No surgical scars.   Neurologic Neurologic evaluation reveals -alert and oriented x 3 with no impairment of recent or remote memory, normal attention span and ability to concentrate, normal sensation and normal coordination.  Musculoskeletal Normal Exam - Bilateral-Upper Extremity Strength Normal and Lower Extremity Strength Normal.    Assessment & Plan  ABDOMINAL PAIN, RIGHT LATERAL (R10.9)   you continue to have severe right upper quadrant and right flank pain and nausea daily. your gallbladder tests are all nondiagnostic He still seemed tender in the right upper quadrant on exam Your CT scan shows no evidence of any serious disease within the abdominal organs Dr. Ellene Route does not think the pain is coming from your back or back nerves  The next step in your treatment is a diagnostic laparoscopy to see if we see anything else, and if we do not then to proceed with laparoscopic cholecystectomy It is not clear whether your gallbladder is the cause of the problem, but we have ruled out essentially everything else and you remain in pain. I discussed the indications, techniques, and risk of this surgery in detail We have gone over the patient information booklet We will proceed with this surgery in the next day or 2.  HISTORY OF BACK SURGERY (Z98.890) CHRONIC CONSTIPATION (K59.09) HYPERTENSION, BENIGN (I10) HYPOTHYROIDISM, ADULT (E03.9) CHRONIC GERD  (K21.9)   Geral Coker M. Dalbert Batman, M.D., [facility] General and Minimally invasive Surgery Breast and Colorectal Surgery Office:   [phone] Pager:   [phone]

## 2018-08-24 ENCOUNTER — Ambulatory Visit (HOSPITAL_COMMUNITY): Payer: Medicare Other | Admitting: Physician Assistant

## 2018-08-24 ENCOUNTER — Encounter (HOSPITAL_COMMUNITY): Payer: Self-pay

## 2018-08-24 ENCOUNTER — Other Ambulatory Visit: Payer: Self-pay

## 2018-08-24 ENCOUNTER — Encounter (HOSPITAL_COMMUNITY)
Admission: RE | Disposition: A | Discharge status: Home / Self Care | Payer: Self-pay | Source: Home / Self Care | Attending: General Surgery

## 2018-08-24 ENCOUNTER — Ambulatory Visit (HOSPITAL_COMMUNITY)
Admission: RE | Admit: 2018-08-24 | Discharge: 2018-08-26 | Disposition: A | Discharge status: Home / Self Care | Payer: Medicare Other | Attending: General Surgery | Admitting: General Surgery

## 2018-08-24 ENCOUNTER — Ambulatory Visit (HOSPITAL_COMMUNITY): Payer: Medicare Other

## 2018-08-24 DIAGNOSIS — Z79899 Other long term (current) drug therapy: Secondary | ICD-10-CM | POA: Diagnosis not present

## 2018-08-24 DIAGNOSIS — Z79890 Hormone replacement therapy (postmenopausal): Secondary | ICD-10-CM | POA: Diagnosis not present

## 2018-08-24 DIAGNOSIS — K5909 Other constipation: Secondary | ICD-10-CM | POA: Insufficient documentation

## 2018-08-24 DIAGNOSIS — Z86718 Personal history of other venous thrombosis and embolism: Secondary | ICD-10-CM | POA: Insufficient documentation

## 2018-08-24 DIAGNOSIS — K811 Chronic cholecystitis: Secondary | ICD-10-CM | POA: Diagnosis not present

## 2018-08-24 DIAGNOSIS — I10 Essential (primary) hypertension: Secondary | ICD-10-CM | POA: Insufficient documentation

## 2018-08-24 DIAGNOSIS — K429 Umbilical hernia without obstruction or gangrene: Secondary | ICD-10-CM | POA: Diagnosis not present

## 2018-08-24 DIAGNOSIS — K219 Gastro-esophageal reflux disease without esophagitis: Secondary | ICD-10-CM | POA: Diagnosis not present

## 2018-08-24 DIAGNOSIS — E039 Hypothyroidism, unspecified: Secondary | ICD-10-CM | POA: Insufficient documentation

## 2018-08-24 DIAGNOSIS — Z419 Encounter for procedure for purposes other than remedying health state, unspecified: Secondary | ICD-10-CM

## 2018-08-24 DIAGNOSIS — Z87891 Personal history of nicotine dependence: Secondary | ICD-10-CM | POA: Diagnosis not present

## 2018-08-24 DIAGNOSIS — K819 Cholecystitis, unspecified: Secondary | ICD-10-CM | POA: Diagnosis present

## 2018-08-24 HISTORY — DX: Acute embolism and thrombosis of unspecified deep veins of unspecified lower extremity: I82.409

## 2018-08-24 HISTORY — PX: CHOLECYSTECTOMY: SHX55

## 2018-08-24 HISTORY — DX: Cholecystitis, unspecified: K81.9

## 2018-08-24 HISTORY — PX: LAPAROSCOPY: SHX197

## 2018-08-24 LAB — CBC
HCT: 39.1 % (ref 36.0–46.0)
HCT: 38.7 % (ref 36.0–46.0)
Hemoglobin: 12.5 g/dL (ref 12.0–15.0)
Hemoglobin: 12.5 g/dL (ref 12.0–15.0)
MCH: 29.8 pg (ref 26.0–34.0)
MCH: 29.9 pg (ref 26.0–34.0)
MCHC: 32 g/dL (ref 30.0–36.0)
MCHC: 32.3 g/dL (ref 30.0–36.0)
MCV: 93.3 fL (ref 80.0–100.0)
MCV: 92.6 fL (ref 80.0–100.0)
Platelets: 276 10*3/uL (ref 150–400)
Platelets: 271 10*3/uL (ref 150–400)
RBC: 4.19 MIL/uL (ref 3.87–5.11)
RBC: 4.18 MIL/uL (ref 3.87–5.11)
RDW: 12.9 % (ref 11.5–15.5)
RDW: 12.8 % (ref 11.5–15.5)
WBC: 5 10*3/uL (ref 4.0–10.5)
WBC: 7.7 10*3/uL (ref 4.0–10.5)
nRBC: 0 % (ref 0.0–0.2)
nRBC: 0 % (ref 0.0–0.2)

## 2018-08-24 LAB — BASIC METABOLIC PANEL
Anion gap: 11 (ref 5–15)
BUN: 10 mg/dL (ref 8–23)
CO2: 23 mmol/L (ref 22–32)
Calcium: 9.8 mg/dL (ref 8.9–10.3)
Chloride: 102 mmol/L (ref 98–111)
Creatinine, Ser: 0.59 mg/dL (ref 0.44–1.00)
GFR calc Af Amer: >60 mL/min (ref >60)
GFR calc non Af Amer: >60 mL/min (ref >60)
Glucose, Bld: 104 mg/dL — ABNORMAL HIGH (ref 70–99)
Potassium: 4.6 mmol/L (ref 3.5–5.1)
Sodium: 136 mmol/L (ref 135–145)

## 2018-08-24 LAB — CREATININE, SERUM
Creatinine, Ser: 0.71 mg/dL (ref 0.44–1.00)
GFR calc Af Amer: >60 mL/min (ref >60)
GFR calc non Af Amer: >60 mL/min (ref >60)

## 2018-08-24 SURGERY — LAPAROSCOPY, DIAGNOSTIC
Anesthesia: General | Site: Abdomen

## 2018-08-24 MED ORDER — OXYCODONE HCL 5 MG PO TABS
5.0000 mg | ORAL_TABLET | Freq: Once | ORAL | Status: AC | PRN
  Administered 2018-08-24: 5 mg via ORAL

## 2018-08-24 MED ORDER — PANTOPRAZOLE SODIUM 40 MG PO TBEC
40.0000 mg | DELAYED_RELEASE_TABLET | Freq: Every day | ORAL | Status: DC
  Administered 2018-08-25 – 2018-08-26 (×2): 40 mg via ORAL
  Filled 2018-08-24 (×2): qty 1

## 2018-08-24 MED ORDER — PROPOFOL 10 MG/ML IV BOLUS
INTRAVENOUS | Status: DC | PRN
  Administered 2018-08-24: 120 mg via INTRAVENOUS

## 2018-08-24 MED ORDER — PROPOFOL 500 MG/50ML IV EMUL
INTRAVENOUS | Status: DC | PRN
  Administered 2018-08-24: 20 ug/kg/min via INTRAVENOUS

## 2018-08-24 MED ORDER — ESMOLOL HCL 100 MG/10ML IV SOLN
INTRAVENOUS | Status: AC
  Filled 2018-08-24: qty 10

## 2018-08-24 MED ORDER — ONDANSETRON HCL 4 MG/2ML IJ SOLN
4.0000 mg | Freq: Once | INTRAMUSCULAR | Status: AC | PRN
  Administered 2018-08-24: 4 mg via INTRAVENOUS

## 2018-08-24 MED ORDER — BUMETANIDE 0.5 MG PO TABS
0.5000 mg | ORAL_TABLET | Freq: Every day | ORAL | Status: DC | PRN
  Filled 2018-08-24: qty 1

## 2018-08-24 MED ORDER — OXYCODONE HCL 5 MG/5ML PO SOLN: 5.0000 mg | Freq: Once | ORAL | Status: AC | PRN

## 2018-08-24 MED ORDER — PROPOFOL 1000 MG/100ML IV EMUL
INTRAVENOUS | Status: AC
  Filled 2018-08-24: qty 100

## 2018-08-24 MED ORDER — GABAPENTIN 300 MG PO CAPS
300.0000 mg | ORAL_CAPSULE | ORAL | Status: AC
  Administered 2018-08-24: 300 mg via ORAL
  Filled 2018-08-24: qty 1

## 2018-08-24 MED ORDER — SENNA 8.6 MG PO TABS
1.0000 | ORAL_TABLET | Freq: Two times a day (BID) | ORAL | Status: DC
  Administered 2018-08-25 – 2018-08-26 (×3): 8.6 mg via ORAL
  Filled 2018-08-24 (×3): qty 1

## 2018-08-24 MED ORDER — ACETAMINOPHEN 500 MG PO TABS
1000.0000 mg | ORAL_TABLET | ORAL | Status: DC
  Filled 2018-08-24: qty 2

## 2018-08-24 MED ORDER — ONDANSETRON HCL 4 MG/2ML IJ SOLN
INTRAMUSCULAR | Status: DC | PRN
  Administered 2018-08-24: 4 mg via INTRAVENOUS

## 2018-08-24 MED ORDER — HYDRALAZINE HCL 20 MG/ML IJ SOLN
5.0000 mg | INTRAMUSCULAR | Status: AC | PRN
  Administered 2018-08-24 (×2): 5 mg via INTRAVENOUS

## 2018-08-24 MED ORDER — ROCURONIUM BROMIDE 50 MG/5ML IV SOSY
PREFILLED_SYRINGE | INTRAVENOUS | Status: DC | PRN
  Administered 2018-08-24: 50 mg via INTRAVENOUS

## 2018-08-24 MED ORDER — HYDROMORPHONE HCL 1 MG/ML IJ SOLN
1.0000 mg | INTRAMUSCULAR | Status: DC | PRN
  Administered 2018-08-24 – 2018-08-26 (×6): 1 mg via INTRAVENOUS
  Filled 2018-08-24 (×6): qty 1

## 2018-08-24 MED ORDER — SODIUM CHLORIDE 0.9 % IV SOLN
INTRAVENOUS | Status: DC | PRN
  Administered 2018-08-24: 25 mL

## 2018-08-24 MED ORDER — SUCCINYLCHOLINE CHLORIDE 200 MG/10ML IV SOSY
PREFILLED_SYRINGE | INTRAVENOUS | Status: DC | PRN
  Administered 2018-08-24: 100 mg via INTRAVENOUS

## 2018-08-24 MED ORDER — MECLIZINE HCL 25 MG PO TABS
25.0000 mg | ORAL_TABLET | Freq: Three times a day (TID) | ORAL | Status: DC | PRN
  Filled 2018-08-24: qty 1

## 2018-08-24 MED ORDER — FENTANYL CITRATE (PF) 100 MCG/2ML IJ SOLN
25.0000 ug | INTRAMUSCULAR | Status: DC | PRN
  Administered 2018-08-24 (×2): 50 ug via INTRAVENOUS

## 2018-08-24 MED ORDER — TRAMADOL HCL 50 MG PO TABS
50.0000 mg | ORAL_TABLET | Freq: Four times a day (QID) | ORAL | Status: DC | PRN
  Administered 2018-08-25: 50 mg via ORAL
  Filled 2018-08-24: qty 1

## 2018-08-24 MED ORDER — HYDRALAZINE HCL 20 MG/ML IJ SOLN
INTRAMUSCULAR | Status: AC
  Administered 2018-08-24: 14:00:00 5 mg via INTRAVENOUS
  Filled 2018-08-24: qty 1

## 2018-08-24 MED ORDER — SODIUM CHLORIDE 0.9 % IR SOLN
Status: DC | PRN
  Administered 2018-08-24: 1

## 2018-08-24 MED ORDER — 0.9 % SODIUM CHLORIDE (POUR BTL) OPTIME
TOPICAL | Status: DC | PRN
  Administered 2018-08-24: 1000 mL

## 2018-08-24 MED ORDER — ENOXAPARIN SODIUM 40 MG/0.4ML ~~LOC~~ SOLN
40.0000 mg | SUBCUTANEOUS | Status: DC
  Administered 2018-08-25: 40 mg via SUBCUTANEOUS
  Filled 2018-08-24: qty 0.4

## 2018-08-24 MED ORDER — FENTANYL CITRATE (PF) 250 MCG/5ML IJ SOLN
INTRAMUSCULAR | Status: AC
  Filled 2018-08-24: qty 5

## 2018-08-24 MED ORDER — FENTANYL CITRATE (PF) 100 MCG/2ML IJ SOLN
INTRAMUSCULAR | Status: AC
  Administered 2018-08-24: 50 ug via INTRAVENOUS
  Filled 2018-08-24: qty 2

## 2018-08-24 MED ORDER — ONDANSETRON HCL 4 MG/2ML IJ SOLN
4.0000 mg | Freq: Four times a day (QID) | INTRAMUSCULAR | Status: DC | PRN
  Administered 2018-08-24 – 2018-08-26 (×4): 4 mg via INTRAVENOUS
  Filled 2018-08-24 (×5): qty 2

## 2018-08-24 MED ORDER — ONDANSETRON 4 MG PO TBDP: 4.0000 mg | ORAL_TABLET | Freq: Four times a day (QID) | ORAL | Status: DC | PRN

## 2018-08-24 MED ORDER — LEVOTHYROXINE SODIUM 50 MCG PO TABS
50.0000 ug | ORAL_TABLET | Freq: Every day | ORAL | Status: DC
  Administered 2018-08-25 – 2018-08-26 (×2): 50 ug via ORAL
  Filled 2018-08-24 (×2): qty 1

## 2018-08-24 MED ORDER — LIDOCAINE 2% (20 MG/ML) 5 ML SYRINGE
INTRAMUSCULAR | Status: DC | PRN
  Administered 2018-08-24: 60 mg via INTRAVENOUS

## 2018-08-24 MED ORDER — ONDANSETRON HCL 4 MG/2ML IJ SOLN
INTRAMUSCULAR | Status: AC
  Filled 2018-08-24: qty 2

## 2018-08-24 MED ORDER — FENTANYL CITRATE (PF) 250 MCG/5ML IJ SOLN
INTRAMUSCULAR | Status: DC | PRN
  Administered 2018-08-24 (×2): 50 ug via INTRAVENOUS
  Administered 2018-08-24: 100 ug via INTRAVENOUS

## 2018-08-24 MED ORDER — ESMOLOL HCL 100 MG/10ML IV SOLN
INTRAVENOUS | Status: DC | PRN
  Administered 2018-08-24: 20 mg via INTRAVENOUS

## 2018-08-24 MED ORDER — SUGAMMADEX SODIUM 200 MG/2ML IV SOLN
INTRAVENOUS | Status: DC | PRN
  Administered 2018-08-24: 175 mg via INTRAVENOUS

## 2018-08-24 MED ORDER — SUCCINYLCHOLINE CHLORIDE 200 MG/10ML IV SOSY
PREFILLED_SYRINGE | INTRAVENOUS | Status: AC
  Filled 2018-08-24: qty 10

## 2018-08-24 MED ORDER — OXYCODONE HCL 5 MG PO TABS
ORAL_TABLET | ORAL | Status: AC
  Filled 2018-08-24: qty 1

## 2018-08-24 MED ORDER — LACTATED RINGERS IV SOLN
INTRAVENOUS | Status: DC
  Administered 2018-08-25 (×2): via INTRAVENOUS

## 2018-08-24 MED ORDER — LACTATED RINGERS IV SOLN
INTRAVENOUS | Status: DC
  Administered 2018-08-24: 11:00:00 via INTRAVENOUS

## 2018-08-24 MED ORDER — CHLORHEXIDINE GLUCONATE CLOTH 2 % EX PADS: 6.0000 | MEDICATED_PAD | Freq: Once | CUTANEOUS | Status: DC

## 2018-08-24 MED ORDER — OXYCODONE HCL 5 MG PO TABS
5.0000 mg | ORAL_TABLET | ORAL | Status: DC | PRN
  Administered 2018-08-25 – 2018-08-26 (×5): 10 mg via ORAL
  Filled 2018-08-24 (×5): qty 2

## 2018-08-24 MED ORDER — BUPIVACAINE-EPINEPHRINE (PF) 0.25% -1:200000 IJ SOLN
INTRAMUSCULAR | Status: AC
  Filled 2018-08-24: qty 30

## 2018-08-24 MED ORDER — LIDOCAINE 2% (20 MG/ML) 5 ML SYRINGE
INTRAMUSCULAR | Status: AC
  Filled 2018-08-24: qty 5

## 2018-08-24 MED ORDER — CIPROFLOXACIN IN D5W 400 MG/200ML IV SOLN
400.0000 mg | INTRAVENOUS | Status: AC
  Administered 2018-08-24: 400 mg via INTRAVENOUS
  Filled 2018-08-24: qty 200

## 2018-08-24 MED ORDER — BUPIVACAINE-EPINEPHRINE 0.5% -1:200000 IJ SOLN
INTRAMUSCULAR | Status: DC | PRN
  Administered 2018-08-24: 7 mL

## 2018-08-24 MED ORDER — DEXAMETHASONE SODIUM PHOSPHATE 10 MG/ML IJ SOLN
INTRAMUSCULAR | Status: DC | PRN
  Administered 2018-08-24: 5 mg via INTRAVENOUS

## 2018-08-24 MED ORDER — DEXAMETHASONE SODIUM PHOSPHATE 10 MG/ML IJ SOLN
INTRAMUSCULAR | Status: AC
  Filled 2018-08-24: qty 1

## 2018-08-24 SURGICAL SUPPLY — 41 items
APPLIER CLIP ROT 10 11.4 M/L (STAPLE) ×2
BLADE CLIPPER SURG (BLADE) IMPLANT
CANISTER SUCT 3000ML PPV (MISCELLANEOUS) ×2 IMPLANT
CATH REDDICK CHOLANGI 4FR 50CM (CATHETERS) ×2 IMPLANT
CHLORAPREP W/TINT 26ML (MISCELLANEOUS) ×2 IMPLANT
CLIP APPLIE ROT 10 11.4 M/L (STAPLE) ×1 IMPLANT
COVER MAYO STAND STRL (DRAPES) ×2 IMPLANT
COVER SURGICAL LIGHT HANDLE (MISCELLANEOUS) ×2 IMPLANT
COVER WAND RF STERILE (DRAPES) ×2 IMPLANT
DERMABOND ADVANCED (GAUZE/BANDAGES/DRESSINGS) ×1
DERMABOND ADVANCED .7 DNX12 (GAUZE/BANDAGES/DRESSINGS) ×1 IMPLANT
DRAPE C-ARM 42X72 X-RAY (DRAPES) ×2 IMPLANT
DRAPE LAPAROSCOPIC ABDOMINAL (DRAPES) ×2 IMPLANT
ELECT REM PT RETURN 9FT ADLT (ELECTROSURGICAL) ×2
ELECTRODE REM PT RTRN 9FT ADLT (ELECTROSURGICAL) ×1 IMPLANT
GLOVE EUDERMIC 7 POWDERFREE (GLOVE) ×2 IMPLANT
GOWN STRL REUS W/ TWL LRG LVL3 (GOWN DISPOSABLE) ×2 IMPLANT
GOWN STRL REUS W/ TWL XL LVL3 (GOWN DISPOSABLE) ×1 IMPLANT
GOWN STRL REUS W/TWL LRG LVL3 (GOWN DISPOSABLE) ×2
GOWN STRL REUS W/TWL XL LVL3 (GOWN DISPOSABLE) ×1
KIT BASIN OR (CUSTOM PROCEDURE TRAY) ×2 IMPLANT
KIT TURNOVER KIT B (KITS) ×2 IMPLANT
NS IRRIG 1000ML POUR BTL (IV SOLUTION) ×2 IMPLANT
PAD ARMBOARD 7.5X6 YLW CONV (MISCELLANEOUS) ×4 IMPLANT
POUCH RETRIEVAL ECOSAC 10 (ENDOMECHANICALS) IMPLANT
POUCH RETRIEVAL ECOSAC 10MM (ENDOMECHANICALS)
POUCH SPECIMEN RETRIEVAL 10MM (ENDOMECHANICALS) ×2 IMPLANT
SCISSORS LAP 5X35 DISP (ENDOMECHANICALS) ×2 IMPLANT
SET CHOLANGIOGRAPH 5 50 .035 (SET/KITS/TRAYS/PACK) ×2 IMPLANT
SET IRRIG TUBING LAPAROSCOPIC (IRRIGATION / IRRIGATOR) ×2 IMPLANT
SET TUBE SMOKE EVAC HIGH FLOW (TUBING) ×2 IMPLANT
SLEEVE ENDOPATH XCEL 5M (ENDOMECHANICALS) ×2 IMPLANT
SPECIMEN JAR SMALL (MISCELLANEOUS) ×2 IMPLANT
SUT MNCRL AB 4-0 PS2 18 (SUTURE) ×2 IMPLANT
TOWEL OR 17X24 6PK STRL BLUE (TOWEL DISPOSABLE) ×2 IMPLANT
TOWEL OR 17X26 10 PK STRL BLUE (TOWEL DISPOSABLE) ×2 IMPLANT
TRAY LAPAROSCOPIC MC (CUSTOM PROCEDURE TRAY) ×2 IMPLANT
TROCAR XCEL BLUNT TIP 100MML (ENDOMECHANICALS) ×2 IMPLANT
TROCAR XCEL NON-BLD 11X100MML (ENDOMECHANICALS) ×2 IMPLANT
TROCAR XCEL NON-BLD 5MMX100MML (ENDOMECHANICALS) ×2 IMPLANT
WATER STERILE IRR 1000ML POUR (IV SOLUTION) ×2 IMPLANT

## 2018-08-24 NOTE — Transfer of Care (Signed)
Immediate Anesthesia Transfer of Care Note  Patient: [name]  Procedure(s) Performed: LAPAROSCOPY DIAGNOSTIC (N/A Abdomen) LAPAROSCOPIC CHOLECYSTECTOMY WITH INTRAOPERATIVE CHOLANGIOGRAM (N/A Abdomen)  Patient Location: PACU  Anesthesia Type:General  Level of Consciousness: awake, alert  and oriented  Airway & Oxygen Therapy: Patient Spontanous Breathing and Patient connected to face mask oxygen  Post-op Assessment: Report given to RN and Post -op Vital signs reviewed and stable  Post vital signs: Reviewed and stable  Last Vitals:  Vitals Value Taken Time  BP 171/84 08/24/2018  1:41 PM  Temp 36.3 C 08/24/2018  1:41 PM  Pulse 59 08/24/2018  1:42 PM  Resp 16 08/24/2018  1:42 PM  SpO2 100 % 08/24/2018  1:42 PM  Vitals shown include unvalidated device data.  Last Pain:  Vitals:   08/24/18 1034  TempSrc: Oral  PainSc:       Patients Stated Pain Goal: 2 (77/03/40 3524)  Complications: No apparent anesthesia complications

## 2018-08-24 NOTE — Anesthesia Preprocedure Evaluation (Addendum)
Anesthesia Evaluation  Patient identified by MRN, date of birth, ID band Patient awake    Reviewed: Allergy & Precautions, NPO status , Patient's Chart, lab work & pertinent test results  History of Anesthesia Complications (+) PONV and history of anesthetic complications  Airway Mallampati: II  TM Distance: >3 FB Neck ROM: Full    Dental  (+) Dental Advisory Given, Partial Lower, Partial Upper   Pulmonary former smoker,    breath sounds clear to auscultation       Cardiovascular hypertension, + DVT   Rhythm:Regular Rate:Normal     Neuro/Psych  Vertigo  negative psych ROS   GI/Hepatic Neg liver ROS, GERD  Medicated and Controlled,  Endo/Other  Hypothyroidism   Renal/GU negative Renal ROS     Musculoskeletal  (+) Arthritis ,   Abdominal   Peds  Hematology negative hematology ROS (+)   Anesthesia Other Findings B/L ptosis  Reproductive/Obstetrics                            Anesthesia Physical Anesthesia Plan  ASA: II  Anesthesia Plan: General   Post-op Pain Management:    Induction: Intravenous  PONV Risk Score and Plan: 4 or greater and Treatment may vary due to age or medical condition, Ondansetron, Propofol infusion and Dexamethasone  Airway Management Planned: Oral ETT  Additional Equipment: None  Intra-op Plan:   Post-operative Plan: Extubation in OR  Informed Consent: I have reviewed the patients History and Physical, chart, labs and discussed the procedure including the risks, benefits and alternatives for the proposed anesthesia with the patient or authorized representative who has indicated his/her understanding and acceptance.     Dental advisory given  Plan Discussed with: CRNA and Anesthesiologist  Anesthesia Plan Comments:       Anesthesia Quick Evaluation

## 2018-08-24 NOTE — Discharge Instructions (Signed)
[facility] ______[facility] °LAPAROSCOPIC SURGERY: POST OP INSTRUCTIONS °Always review your discharge instruction sheet given to you by the facility where your surgery was performed. °IF YOU HAVE DISABILITY OR FAMILY LEAVE FORMS, YOU MUST BRING THEM TO THE OFFICE FOR PROCESSING.   °DO NOT GIVE THEM TO YOUR DOCTOR. ° °1. A prescription for pain medication may be given to you upon discharge.  Take your pain medication as prescribed, if needed.  If narcotic pain medicine is not needed, then you may take acetaminophen (Tylenol) or ibuprofen (Advil) as needed. °2. Take your usually prescribed medications unless otherwise directed. °3. If you need a refill on your pain medication, please contact your pharmacy.  They will contact our office to request authorization. Prescriptions will not be filled after 5pm or on week-ends. °4. You should follow a light diet the first few days after arrival home, such as soup and crackers, etc.  Be sure to include lots of fluids daily. °5. Most patients will experience some swelling and bruising in the area of the incisions.  Ice packs will help.  Swelling and bruising can take several days to resolve.  °6. It is common to experience some constipation if taking pain medication after surgery.  Increasing fluid intake and taking a stool softener (such as Colace) will usually help or prevent this problem from occurring.  A mild laxative (Milk of Magnesia or Miralax) should be taken according to package instructions if there are no bowel movements after 48 hours. °7. Unless discharge instructions indicate otherwise, you may remove your bandages 24-48 hours after surgery, and you may shower at that time.  You may have steri-strips (small skin tapes) in place directly over the incision.  These strips should be left on the skin for 7-10 days.  If your surgeon used skin glue on the incision, you may shower in 24 hours.  The glue will flake off over the next 2-3 weeks.  Any sutures or  staples will be removed at the office during your follow-up visit. °8. ACTIVITIES:  You may resume regular (light) daily activities beginning the next day--such as daily self-care, walking, climbing stairs--gradually increasing activities as tolerated.  You may have sexual intercourse when it is comfortable.  Refrain from any heavy lifting or straining until approved by your doctor. °a. You may drive when you are no longer taking prescription pain medication, you can comfortably wear a seatbelt, and you can safely maneuver your car and apply brakes. °b. RETURN TO WORK:  __________________________________________________________ °9. You should see your doctor in the office for a follow-up appointment approximately 2-3 weeks after your surgery.  Make sure that you call for this appointment within a day or two after you arrive home to insure a convenient appointment time. °10. OTHER INSTRUCTIONS: __________________________________________________________________________________________________________________________ __________________________________________________________________________________________________________________________ °WHEN TO CALL YOUR DOCTOR: °1. Fever over 101.0 °2. Inability to urinate °3. Continued bleeding from incision. °4. Increased pain, redness, or drainage from the incision. °5. Increasing abdominal pain ° °The clinic staff is available to answer your questions during regular business hours.  Please don’t hesitate to call and ask to speak to one of the nurses for clinical concerns.  If you have a medical emergency, go to the nearest emergency room or call 911.  A surgeon from [facility] is always on call at the hospital. °[address], [address], [place], [place]  [address] ? [address] [address], [place],    [address] °[phone] ? [phone] ? FAX [phone] °Web site:   [email or web address] ° °••••••••• ° ° °Managing Your Pain After Surgery Without  Opioids ° ° ° °Thank you for participating in our program to help patients manage their pain after surgery without opioids. This is part of our effort to provide you with the best care possible, without exposing you or your family to the risk that opioids pose. ° °What pain can I expect after surgery? °You can expect to have some pain after surgery. This is normal. The pain is typically worse the day after surgery, and quickly begins to get better. °Many studies have found that many patients are able to manage their pain after surgery with Over-the-Counter (OTC) medications such as Tylenol and Motrin. If you have a condition that does not allow you to take Tylenol or Motrin, notify your surgical team. ° °How will I manage my pain? °The best strategy for controlling your pain after surgery is around the clock pain control with Tylenol (acetaminophen) and Motrin (ibuprofen or Advil). Alternating these medications with each other allows you to maximize your pain control. In addition to Tylenol and Motrin, you can use heating pads or ice packs on your incisions to help reduce your pain. ° °How will I alternate your regular strength over-the-counter pain medication? °You will take a dose of pain medication every three hours. °; Start by taking 650 mg of Tylenol (2 pills of 325 mg) °; 3 hours later take 600 mg of Motrin (3 pills of 200 mg) °; 3 hours after taking the Motrin take 650 mg of Tylenol °; 3 hours after that take 600 mg of Motrin. ° ° °- 1 - ° °See example - if your first dose of Tylenol is at 12:00 PM ° ° °12:00 PM Tylenol 650 mg (2 pills of 325 mg)  °3:00 PM Motrin 600 mg (3 pills of 200 mg)  °6:00 PM Tylenol 650 mg (2 pills of 325 mg)  °9:00 PM Motrin 600 mg (3 pills of 200 mg)  °Continue alternating every 3 hours  ° °We recommend that you follow this schedule around-the-clock for at least 3 days after surgery, or until you feel that it is no longer needed. Use the table on the last page of this handout to  keep track of the medications you are taking. °Important: °Do not take more than 3000mg of Tylenol or 3200mg of Motrin in a 24-hour period. °Do not take ibuprofen/Motrin if you have a history of bleeding stomach ulcers, severe kidney disease, &/or actively taking a blood thinner ° °What if I still have pain? °If you have pain that is not controlled with the over-the-counter pain medications (Tylenol and Motrin or Advil) you might have what we call “breakthrough” pain. You will receive a prescription for a small amount of an opioid pain medication such as Oxycodone, Tramadol, or Tylenol with Codeine. Use these opioid pills in the first 24 hours after surgery if you have breakthrough pain. Do not take more than 1 pill every 4-6 hours. ° °If you still have uncontrolled pain after using all opioid pills, don't hesitate to call our staff using the number provided. We will help make sure you are managing your pain in the best way possible, and if necessary, we can provide a prescription for additional pain medication. ° ° °Day 1   ° °Time  °Name of Medication Number of pills taken  °Amount of Acetaminophen  °Pain Level  ° °Comments  °AM PM       °AM PM       °  AM PM       °AM PM       °AM PM       °AM PM       °AM PM       °AM PM       °Total Daily amount of Acetaminophen °Do not take more than  3,000 mg per day    ° ° °Day 2   ° °Time  °Name of Medication Number of pills °taken  °Amount of Acetaminophen  °Pain Level  ° °Comments  °AM PM       °AM PM       °AM PM       °AM PM       °AM PM       °AM PM       °AM PM       °AM PM       °Total Daily amount of Acetaminophen °Do not take more than  3,000 mg per day    ° ° °Day 3   ° °Time  °Name of Medication Number of pills taken  °Amount of Acetaminophen  °Pain Level  ° °Comments  °AM PM       °AM PM       °AM PM       °AM PM       ° ° ° °AM PM       °AM PM       °AM PM       °AM PM       °Total Daily amount of Acetaminophen °Do not take more than  3,000 mg per day    ° ° °Day  4   ° °Time  °Name of Medication Number of pills taken  °Amount of Acetaminophen  °Pain Level  ° °Comments  °AM PM       °AM PM       °AM PM       °AM PM       °AM PM       °AM PM       °AM PM       °AM PM       °Total Daily amount of Acetaminophen °Do not take more than  3,000 mg per day    ° ° °Day 5   ° °Time  °Name of Medication Number °of pills taken  °Amount of Acetaminophen  °Pain Level  ° °Comments  °AM PM       °AM PM       °AM PM       °AM PM       °AM PM       °AM PM       °AM PM       °AM PM       °Total Daily amount of Acetaminophen °Do not take more than  3,000 mg per day    ° ° ° °Day 6   ° °Time  °Name of Medication Number of pills °taken  °Amount of Acetaminophen  °Pain Level  °Comments  °AM PM       °AM PM       °AM PM       °AM PM       °AM PM       °AM PM       °AM PM       °AM PM       °Total Daily amount   of Acetaminophen °Do not take more than  3,000 mg per day    ° ° °Day 7   ° °Time  °Name of Medication Number of pills taken  °Amount of Acetaminophen  °Pain Level  ° °Comments  °AM PM       °AM PM       °AM PM       °AM PM       °AM PM       °AM PM       °AM PM       °AM PM       °Total Daily amount of Acetaminophen °Do not take more than  3,000 mg per day    ° ° ° ° °For additional information about how and where to safely dispose of unused opioid °medications - https://www.morepowerful[employer].org ° °Disclaimer: This document contains information and/or instructional materials adapted from [facility] for the typical patient with your condition. It does not replace medical advice from your health care provider because your experience may differ from that of the °typical patient. Talk to your health care provider if you have any questions about this °document, your condition or your treatment plan. °Adapted from [facility] ° ° °

## 2018-08-24 NOTE — Interval H&P Note (Signed)
History and Physical Interval Note:  08/24/2018 11:12 AM  [name]  has presented today for surgery, with the diagnosis of acalculous cholecystitis.  The various methods of treatment have been discussed with the patient and family. After consideration of risks, benefits and other options for treatment, the patient has consented to  Procedure(s): LAPAROSCOPY DIAGNOSTIC (N/A) LAPAROSCOPIC CHOLECYSTECTOMY WITH INTRAOPERATIVE CHOLANGIOGRAM (N/A) as a surgical intervention.  The patient's history has been reviewed, patient examined, no change in status, stable for surgery.  I have reviewed the patient's chart and labs.  Questions were answered to the patient's satisfaction.     Adin Hector

## 2018-08-24 NOTE — Op Note (Signed)
Patient Name:           [name]   Date of Surgery:        08/24/2018  Pre op Diagnosis:      Acalculous cholecystitis  Post op Diagnosis:    Acalculous cholecystitis, umbilical hernia  Procedure:                 Diagnostic laparoscopy, laparoscopic cholecystectomy with cholangiogram, primary repair umbilical hernia  Surgeon:                     Edsel Petrin. Dalbert Batman, M.D., FACS  Assistant:                      Leighton Ruff, MD   Indication for Assistant: Complex exposure.  Complex adhesions.  Operative Indications:   . This is a 85 year old woman who returns because of persistent daily severe right upper quadrant and right flank pain. Dr. Laurence Spates, Dr. Janeice Robinson and Dr. Kristeen Miss are involved in her care.      I saw her on May 13 at which time she related a six-month history of intermittent right upper quadrant pain but this is beginning to get more severe and daily. She is nauseated but does not vomit. Constipation no diarrhea. Worse at night especially when she sleeps with her left side down. Chronic GERD well controlled with omeprazole. She iss subjectively tender in the right upper quadrant intermittently.      Gallbladder ultrasound shows borderline wall thickening and subjectively a little tender but basically normal otherwise.       Hepatobiliary scan with ejection fraction is completely normal. Lab work is completely normal.       Subsequent CT scan abdomen and pelvis shows no acute intra-abdominal abnormality.      Kristeen Miss was kind enough to see her promptly, x-rayed her and felt that there was no spine or musculoskeletal source of her pain. He gave her a prescription for pain medicine and that helped some. Although we are not certain of her diagnosis, we feel that the next step in her care is diagnostic laparoscopy and laparoscopic cholecystectomy     Past history reveals 3 back surgeries a year ago. GERD well controlled. Last colonoscopy 2017 by  Dr. Amedeo Plenty. Hypothyroidism. Occasional dizziness takes meclizine. No major cardiac or pulmonary diseaset. I told her I was uncertain as to whether removing her gallbladder would resolve her pain, but that given her workup, everything else seemed to have been ruled out. Empiric cholecystectomy is the reasonable next medical option at this point. Operative procedure planned is diagnostic laparoscopy to make sure that we didn't see anything else, and then proceed with cholecystectomy and possible cholangiogram.  She is anxious to proceed with surgery at the earliest possible  moment. Marland Kitchen  Operative Findings:       She had a small umbilical hernia which we repaired on the way out.  The gallbladder was  chronically inflamed.  A little bit discolored and slightly thin-walled.  The most notable finding were extensive adhesions to the fundus body and infundibulum of the gallbladder.  The anatomy of the cystic duct was fairly conventional and we created a large critical view and window behind this.  I did a cholangiogram which showed no gross abnormalities.  The metal hardware stabilizing her spine obscures some of the pictures but we could see intrahepatic and extrahepatic biliary ducts, no filling defect, and good flow of contrast  into the duodenum.  Four-quadrant inspection before and after the procedure showed no other visceral or peritoneal disease.  Procedure in Detail:          Following the induction of general endotracheal anesthesia the patient's abdomen was prepped and draped in a sterile fashion.  Surgical timeout was performed.  Intravenous antibiotics were given.  0.5% Marcaine with epinephrine was used as a local infiltration anesthetic.  A vertical incision was made at the lower rim of the umbilicus.  Dissection was carried down to the umbilical hernia and we dissected the umbilicus away from the defect.  We entered the peritoneal space through the umbilical defect and a Hassan cannula was placed  and secured with the pursestring suture of 0 Vicryl.  Pneumoperitoneum was created.  Video camera was inserted.  3 trochars were placed in the upper abdomen and to the right.  The gallbladder fundus was easily  visible and was elevated.  We had to spend about 10 minutes taking down all of the adhesions.  I ultimately dissected out the cystic duct, anterior branch of the cystic artery and posterior branch of the cystic artery.  The cystic duct was cannulated with cholangiogram catheter.  I took 3 separate runs of the cholangiogram to get adequate visualization because of the metal hardware in her back.  I was satisfied with the cholangiogram as well as the operative anatomy.  The cholangiogram catheter was removed.  The cystic duct was secured with 3 metal clips and divided.  Anterior branch and  the posterior branch of the cystic artery were doubly clipped and divided.  The gallbladder was dissected from its bed with electrocautery, placed in a specimen bag and removed.  Dissection out of the gallbladder bed was a little bit chronically inflamed and fibrotic.  We had to cauterize a few areas but at the end of the case there was no bleeding and no bile leak.  The irrigation fluid was completely clear and was evacuated.     Four-quadrant inspection showed no injury or bleeding.  The pneumoperitoneum was released to the close systems.  All the trochars were removed.  We removed the pursestring suture at the umbilicus.  We then dissected out the rim of the umbilical hernia.  We closed the umbilical hernia with figure-of-eight sutures of 0 Vicryl.  After irrigating the wounds we closed the skin incisions with subcuticular 4-0 Monocryl and Dermabond.  The patient tolerated the procedure well and was taken to PACU in stable condition.  EBL 20 cc or less.  Counts correct.  Complications none.    Addendum: I logged onto the PMP aware website and reviewed her prescription medication history     Azzure Garabedian M. Dalbert Batman,  M.D., FACS General and Minimally Invasive Surgery Breast and Colorectal Surgery  08/24/2018 1:36 PM

## 2018-08-24 NOTE — Anesthesia Procedure Notes (Signed)
Procedure Name: Intubation Date/Time: 08/24/2018 12:15 PM Performed by: Kathryne Hitch, CRNA Pre-anesthesia Checklist: Patient identified, Emergency Drugs available, Suction available, Timeout performed and Patient being monitored Patient Re-evaluated:Patient Re-evaluated prior to induction Oxygen Delivery Method: Circle system utilized Preoxygenation: Pre-oxygenation with 100% oxygen Induction Type: IV induction, Rapid sequence and Cricoid Pressure applied Laryngoscope Size: Miller and 2 Grade View: Grade I Tube type: Oral Tube size: 7.0 mm Number of attempts: 1 Airway Equipment and Method: Stylet and Oral airway Placement Confirmation: ETT inserted through vocal cords under direct vision,  positive ETCO2 and breath sounds checked- equal and bilateral Secured at: 22 cm Tube secured with: Tape Dental Injury: Teeth and Oropharynx as per pre-operative assessment

## 2018-08-25 DIAGNOSIS — K811 Chronic cholecystitis: Secondary | ICD-10-CM | POA: Diagnosis not present

## 2018-08-25 LAB — CBC
HCT: 39.9 % (ref 36.0–46.0)
Hemoglobin: 13.1 g/dL (ref 12.0–15.0)
MCH: 30 pg (ref 26.0–34.0)
MCHC: 32.8 g/dL (ref 30.0–36.0)
MCV: 91.5 fL (ref 80.0–100.0)
Platelets: 282 10*3/uL (ref 150–400)
RBC: 4.36 MIL/uL (ref 3.87–5.11)
RDW: 12.9 % (ref 11.5–15.5)
WBC: 10.7 10*3/uL — ABNORMAL HIGH (ref 4.0–10.5)
nRBC: 0 % (ref 0.0–0.2)

## 2018-08-25 MED ORDER — METHOCARBAMOL 750 MG PO TABS
750.0000 mg | ORAL_TABLET | Freq: Three times a day (TID) | ORAL | Status: DC | PRN
  Administered 2018-08-25 (×2): 750 mg via ORAL
  Filled 2018-08-25 (×2): qty 1

## 2018-08-25 MED ORDER — METOPROLOL TARTRATE 5 MG/5ML IV SOLN
5.0000 mg | Freq: Four times a day (QID) | INTRAVENOUS | Status: DC | PRN
  Administered 2018-08-25: 5 mg via INTRAVENOUS
  Filled 2018-08-25: qty 5

## 2018-08-25 MED ORDER — SIMETHICONE 80 MG PO CHEW: 80.0000 mg | CHEWABLE_TABLET | Freq: Four times a day (QID) | ORAL | Status: DC | PRN

## 2018-08-25 MED ORDER — CALCIUM CARBONATE ANTACID 500 MG PO CHEW
1.0000 | CHEWABLE_TABLET | Freq: Three times a day (TID) | ORAL | Status: DC | PRN
  Administered 2018-08-25: 200 mg via ORAL
  Filled 2018-08-25: qty 1

## 2018-08-25 NOTE — Progress Notes (Signed)
Pt c/o abdominal pain,says she has not ambulated or passed gas. Encouraged to get up and walk. Reluctantly completed a full lap on unit this pm

## 2018-08-25 NOTE — Progress Notes (Signed)
Pt medicated several times with current pain meds ordered but keeps c/o of gas type pains. Encouraged to walk but says she wont go walking until she gets some gas relief. Tried to explain co relationship of gas and walking but insists she needs something before she can get up and ambulate. MD text paged

## 2018-08-25 NOTE — Progress Notes (Signed)
1 Day Post-Op   Subjective/Chief Complaint: Reports she vomited several times, quite sore and having back pain  Objective: Vital signs in last 24 hours: Temp:  [97.3 F (36.3 C)-98.3 F (36.8 C)] 98.3 F (36.8 C) (05/23 0516) Pulse Rate:  [59-79] 71 (05/23 0516) Resp:  [13-20] 17 (05/23 0516) BP: (134-196)/(63-96) 138/66 (05/23 0516) SpO2:  [89 %-100 %] 98 % (05/23 0516) Weight:  [79.4 kg-83.4 kg] 83.4 kg (05/22 1600) Last BM Date: 08/23/18  Intake/Output from previous day: 05/22 0701 - 05/23 0700 In: 8338 [P.O.:230; I.V.:1000] Out: 3 [Blood:3] Intake/Output this shift: No intake/output data recorded.  General appearance: alert and cooperative Resp: clear to auscultation bilaterally GI: soft, incisions CDI  Lab Results:  Recent Labs    08/24/18 1018 08/24/18 1624  WBC 5.0 7.7  HGB 12.5 12.5  HCT 39.1 38.7  PLT 276 271   BMET Recent Labs    08/24/18 1018 08/24/18 1624  NA 136  --   K 4.6  --   CL 102  --   CO2 23  --   GLUCOSE 104*  --   BUN 10  --   CREATININE 0.59 0.71  CALCIUM 9.8  --    PT/INR No results for input(s): LABPROT, INR in the last 72 hours. ABG No results for input(s): PHART, HCO3 in the last 72 hours.  Invalid input(s): PCO2, PO2  Studies/Results: Dg Cholangiogram Operative  Result Date: 08/24/2018 CLINICAL DATA:  85 year old female with a history of cholelithiasis EXAM: INTRAOPERATIVE CHOLANGIOGRAM TECHNIQUE: Cholangiographic images from the C-arm fluoroscopic device were submitted for interpretation post-operatively. Please see the procedural report for the amount of contrast and the fluoroscopy time utilized. COMPARISON:  CT 08/16/2018 FINDINGS: Surgical instruments project over the upper abdomen. There is cannulation of the cystic duct/gallbladder neck, with antegrade infusion of contrast. Caliber of the extrahepatic ductal system within normal limits. No definite filling defect within the extrahepatic ducts identified. Free flow of  contrast across the ampulla. IMPRESSION: Intraoperative cholangiogram demonstrates extrahepatic biliary ducts of unremarkable caliber, with no definite filling defects identified. Free flow of contrast across the ampulla. Please refer to the dictated operative report for full details of intraoperative findings and procedure Electronically Signed   By: Corrie Mckusick D.O.   On: 08/24/2018 15:44    Anti-infectives: Anti-infectives (From admission, onward)   Start     Dose/Rate Route Frequency Ordered Stop   08/24/18 1000  ciprofloxacin (CIPRO) IVPB 400 mg     400 mg 200 mL/hr over 60 Minutes Intravenous On call to O.R. 08/24/18 0955 08/24/18 1215      Assessment/Plan: S/P lap chole, IOC, repair [facility] POD#1  Add robaxin Try diet Hopefully will be ready to D/C tomorrow  LOS: 0 days    Zenovia Jarred 08/25/2018

## 2018-08-26 DIAGNOSIS — K811 Chronic cholecystitis: Secondary | ICD-10-CM | POA: Diagnosis not present

## 2018-08-26 LAB — COMPREHENSIVE METABOLIC PANEL WITH GFR
ALT: 33 U/L (ref 0–44)
AST: 31 U/L (ref 15–41)
Albumin: 3.8 g/dL (ref 3.5–5.0)
Alkaline Phosphatase: 103 U/L (ref 38–126)
Anion gap: 10 (ref 5–15)
BUN: 11 mg/dL (ref 8–23)
CO2: 28 mmol/L (ref 22–32)
Calcium: 9.9 mg/dL (ref 8.9–10.3)
Chloride: 96 mmol/L — ABNORMAL LOW (ref 98–111)
Creatinine, Ser: 0.66 mg/dL (ref 0.44–1.00)
GFR calc Af Amer: >60 mL/min
GFR calc non Af Amer: >60 mL/min
Glucose, Bld: 176 mg/dL — ABNORMAL HIGH (ref 70–99)
Potassium: 3.7 mmol/L (ref 3.5–5.1)
Sodium: 134 mmol/L — ABNORMAL LOW (ref 135–145)
Total Bilirubin: 0.6 mg/dL (ref 0.3–1.2)
Total Protein: 7.1 g/dL (ref 6.5–8.1)

## 2018-08-26 MED ORDER — ONDANSETRON HCL 4 MG PO TABS: 4.0000 mg | ORAL_TABLET | Freq: Four times a day (QID) | ORAL | 0 refills | Status: DC

## 2018-08-26 MED ORDER — OXYCODONE HCL 5 MG PO TABS: 5.0000 mg | ORAL_TABLET | Freq: Four times a day (QID) | ORAL | 0 refills | Status: DC | PRN

## 2018-08-26 NOTE — Progress Notes (Signed)
08/25/18  2159   Pt's BP: 193/102 (129), PR: 88 with uncontrolled pain. Notified MD. Verbal order for CMP and CBC stat done.  2212 Given 1 mg dilaudid IV, tolerated well  2305 Pt's BP: 195/93 (122), PR: 94, extreme pain ongoing. Notified MD, acknowledged order for lopressor.   2319 Given metoprolol tartrate 5 mg IV, tolerated well. Will continue to monitor

## 2018-08-26 NOTE — Progress Notes (Signed)
Pt feeling better, is not nauseated anymore.  Gave GingerAle and crackers for home, called husband to come and pick up.  DC instuctions given and reviewed with pt and follow up appointments.  Pt to pick up meds at [facility] on [address], pt understands.

## 2018-08-26 NOTE — Discharge Summary (Signed)
Physician Discharge Summary  Patient ID: [name] MRN: [id number] DOB/AGE: [birth year] 85 y.o.  Admit date: 08/24/2018 Discharge date: 08/26/2018  Admission Diagnoses:chronic cholecystitis, umbilical hernia  Discharge Diagnoses: S/P laparoscopic cholecystectomy and repair umbilical hernia  Discharged Condition: good  Hospital Course: She did well post-op but had a lot of pain POD#1. Labs were checked and were OK. She is doing much better now and ready to go home.  Consults: None  Significant Diagnostic Studies: labs: CBC and CMP  Treatments: surgery: above  Discharge Exam: Blood pressure (!) 136/95, pulse (!) 106, temperature 98 F (36.7 C), temperature source Oral, resp. rate 20, height 5\' 6"  (1.676 m), weight 83.4 kg, SpO2 90 %. General appearance: alert and cooperative Resp: clear to auscultation bilaterally Cardio: regular rate and rhythm GI: soft, incisions CDI  Disposition: Discharge disposition: 01-Home or Self Care       Discharge Instructions    Call MD for:  persistant nausea and vomiting   Complete by:  As directed    Call MD for:  redness, tenderness, or signs of infection (pain, swelling, redness, odor or green/yellow discharge around incision site)   Complete by:  As directed    Diet - low sodium heart healthy   Complete by:  As directed    Discharge instructions   Complete by:  As directed    [facility] ______[facility] LAPAROSCOPIC SURGERY: POST OP INSTRUCTIONS Always review your discharge instruction sheet given to you by the facility where your surgery was performed. IF YOU HAVE DISABILITY OR FAMILY LEAVE FORMS, YOU MUST BRING THEM TO THE OFFICE FOR PROCESSING.   DO NOT GIVE THEM TO YOUR DOCTOR.  A prescription for pain medication may be given to you upon discharge.  Take your pain medication as prescribed, if needed.  If narcotic pain medicine is not needed, then you may take acetaminophen (Tylenol) or ibuprofen (Advil) as  needed. Take your usually prescribed medications unless otherwise directed. If you need a refill on your pain medication, please contact your pharmacy.  They will contact our office to request authorization. Prescriptions will not be filled after 5pm or on week-ends. You should follow a light diet the first few days after arrival home, such as soup and crackers, etc.  Be sure to include lots of fluids daily. Most patients will experience some swelling and bruising in the area of the incisions.  Ice packs will help.  Swelling and bruising can take several days to resolve.  It is common to experience some constipation if taking pain medication after surgery.  Increasing fluid intake and taking a stool softener (such as Colace) will usually help or prevent this problem from occurring.  A mild laxative (Milk of Magnesia or Miralax) should be taken according to package instructions if there are no bowel movements after 48 hours. Unless discharge instructions indicate otherwise, you may remove your bandages 24-48 hours after surgery, and you may shower at that time.  You may have steri-strips (small skin tapes) in place directly over the incision.  These strips should be left on the skin for 7-10 days.  If your surgeon used skin glue on the incision, you may shower in 24 hours.  The glue will flake off over the next 2-3 weeks.  Any sutures or staples will be removed at the office during your follow-up visit. ACTIVITIES:  You may resume regular (light) daily activities beginning the next day-such as daily self-care, walking, climbing stairs-gradually increasing activities as tolerated.  You  may have sexual intercourse when it is comfortable.  Refrain from any heavy lifting or straining until approved by your doctor. You may drive when you are no longer taking prescription pain medication, you can comfortably wear a seatbelt, and you can safely maneuver your car and apply brakes. RETURN TO WORK:   __________________________________________________________ Dennis Bast should see your doctor in the office for a follow-up appointment approximately 2-3 weeks after your surgery.  Make sure that you call for this appointment within a day or two after you arrive home to insure a convenient appointment time. OTHER INSTRUCTIONS: __________________________________________________________________________________________________________________________ __________________________________________________________________________________________________________________________ WHEN TO CALL YOUR DOCTOR: Fever over 101.0 Inability to urinate Continued bleeding from incision. Increased pain, redness, or drainage from the incision. Increasing abdominal pain  The clinic staff is available to answer your questions during regular business hours.  Please don't hesitate to call and ask to speak to one of the nurses for clinical concerns.  If you have a medical emergency, go to the nearest emergency room or call 911.  A surgeon from [facility] is always on call at the hospital. [address], [place], [place], [place]  [address] ? [address], [place], [place]   [address] [phone] ? [phone] ? FAX [phone] Web site: [email or web address]   Increase activity slowly   Complete by:  As directed    Lifting restrictions   Complete by:  As directed    No lifting over 10lbs for 4 weeks   No dressing needed   Complete by:  As directed      Allergies as of 08/26/2018      Reactions   Keflex [cephalexin] Hives      Medication List    STOP taking these medications   HYDROcodone-acetaminophen 5-325 MG tablet Commonly known as:  NORCO/VICODIN     TAKE these medications   bumetanide 0.5 MG tablet Commonly known as:  BUMEX Take 0.5 mg by mouth daily as needed (swelling).   diphenhydramine-acetaminophen 25-500 MG Tabs tablet Commonly known as:  TYLENOL PM Take 2 tablets by  mouth at bedtime.   gabapentin 100 MG capsule Commonly known as:  Neurontin Take 1 capsule (100 mg total) by mouth daily.   levothyroxine 50 MCG tablet Commonly known as:  SYNTHROID Take 50 mcg by mouth daily.   meclizine 25 MG tablet Commonly known as:  ANTIVERT Take 25 mg by mouth 3 (three) times daily as needed for dizziness.   methocarbamol 500 MG tablet Commonly known as:  ROBAXIN Take 1 tablet (500 mg total) by mouth every 6 (six) hours as needed for muscle spasms.   methocarbamol 500 MG tablet Commonly known as:  ROBAXIN Take 1 tablet (500 mg total) by mouth every 8 (eight) hours as needed for muscle spasms.   omeprazole 20 MG capsule Commonly known as:  PRILOSEC Take 20 mg by mouth daily.   oxyCODONE 5 MG immediate release tablet Commonly known as:  Oxy IR/ROXICODONE Take 1 tablet (5 mg total) by mouth every 6 (six) hours as needed for severe pain.      Follow-up Information    Fanny Skates, MD. Schedule an appointment as soon as possible for a visit in 3 weeks.   Specialty:  General Surgery Contact information: [place] [address] [phone]           Signed: Zenovia Jarred 08/26/2018, 9:42 AM

## 2018-08-27 NOTE — Anesthesia Postprocedure Evaluation (Signed)
Anesthesia Post Note  Patient: COUNTESS BIEBEL  Procedure(s) Performed: LAPAROSCOPY DIAGNOSTIC (N/A Abdomen) LAPAROSCOPIC CHOLECYSTECTOMY WITH INTRAOPERATIVE CHOLANGIOGRAM (N/A Abdomen)     Patient location during evaluation: PACU Anesthesia Type: General Level of consciousness: awake and alert Pain management: pain level controlled Vital Signs Assessment: post-procedure vital signs reviewed and stable Respiratory status: spontaneous breathing, nonlabored ventilation and respiratory function stable Cardiovascular status: blood pressure returned to baseline and stable Postop Assessment: no apparent nausea or vomiting Anesthetic complications: no Comments: Required hydralazine in PACU for elevated BPs, improved with medication    Last Vitals:  Vitals:   08/26/18 0441 08/26/18 1100  BP: (!) 136/95 (!) 164/96  Pulse: (!) 106 98  Resp: 20   Temp: 36.7 C   SpO2: 90% 90%    Last Pain:  Vitals:   08/26/18 1107  TempSrc:   PainSc: Ord

## 2018-08-28 ENCOUNTER — Encounter (HOSPITAL_COMMUNITY): Payer: Self-pay | Admitting: General Surgery

## 2018-10-09 ENCOUNTER — Other Ambulatory Visit: Payer: Self-pay | Admitting: General Surgery

## 2018-10-09 DIAGNOSIS — G8918 Other acute postprocedural pain: Secondary | ICD-10-CM

## 2018-10-10 ENCOUNTER — Ambulatory Visit
Admission: RE | Admit: 2018-10-10 | Discharge: 2018-10-10 | Disposition: A | Discharge status: Home / Self Care | Payer: Medicare Other | Source: Ambulatory Visit | Attending: General Surgery | Admitting: General Surgery

## 2018-10-10 DIAGNOSIS — G8918 Other acute postprocedural pain: Secondary | ICD-10-CM

## 2018-10-10 MED ORDER — IOPAMIDOL (ISOVUE-300) INJECTION 61%
100.0000 mL | Freq: Once | INTRAVENOUS | Status: AC | PRN
  Administered 2018-10-10: 100 mL via INTRAVENOUS

## 2018-10-18 ENCOUNTER — Other Ambulatory Visit: Payer: Medicare Other

## 2018-11-12 ENCOUNTER — Other Ambulatory Visit: Payer: Self-pay | Admitting: Internal Medicine

## 2018-11-12 DIAGNOSIS — Z1231 Encounter for screening mammogram for malignant neoplasm of breast: Secondary | ICD-10-CM

## 2018-12-25 ENCOUNTER — Ambulatory Visit
Admission: RE | Admit: 2018-12-25 | Discharge: 2018-12-25 | Disposition: A | Discharge status: Home / Self Care | Payer: Medicare Other | Source: Ambulatory Visit | Attending: Internal Medicine | Admitting: Internal Medicine

## 2018-12-25 ENCOUNTER — Other Ambulatory Visit: Payer: Self-pay

## 2018-12-25 DIAGNOSIS — Z1231 Encounter for screening mammogram for malignant neoplasm of breast: Secondary | ICD-10-CM

## 2019-03-13 ENCOUNTER — Other Ambulatory Visit: Payer: Self-pay | Admitting: Neurological Surgery

## 2019-03-13 ENCOUNTER — Other Ambulatory Visit (HOSPITAL_COMMUNITY): Payer: Self-pay | Admitting: Neurological Surgery

## 2019-03-13 DIAGNOSIS — M5416 Radiculopathy, lumbar region: Secondary | ICD-10-CM

## 2019-04-08 ENCOUNTER — Ambulatory Visit (HOSPITAL_COMMUNITY)
Admission: RE | Admit: 2019-04-08 | Discharge: 2019-04-08 | Disposition: A | Discharge status: Home / Self Care | Payer: Medicare Other | Source: Ambulatory Visit | Attending: Neurological Surgery | Admitting: Neurological Surgery

## 2019-04-08 ENCOUNTER — Other Ambulatory Visit: Payer: Self-pay

## 2019-04-08 DIAGNOSIS — M5416 Radiculopathy, lumbar region: Secondary | ICD-10-CM

## 2019-04-08 DIAGNOSIS — M4716 Other spondylosis with myelopathy, lumbar region: Secondary | ICD-10-CM | POA: Insufficient documentation

## 2019-04-08 DIAGNOSIS — Z981 Arthrodesis status: Secondary | ICD-10-CM | POA: Diagnosis not present

## 2019-04-08 MED ORDER — ONDANSETRON HCL 4 MG/2ML IJ SOLN: 4.0000 mg | Freq: Four times a day (QID) | INTRAMUSCULAR | Status: DC | PRN

## 2019-04-08 MED ORDER — DIAZEPAM 5 MG PO TABS
ORAL_TABLET | ORAL | Status: AC
  Filled 2019-04-08: qty 2

## 2019-04-08 MED ORDER — LIDOCAINE HCL (PF) 1 % IJ SOLN
5.0000 mL | Freq: Once | INTRAMUSCULAR | Status: AC
  Administered 2019-04-08: 10:00:00 5 mL via INTRADERMAL

## 2019-04-08 MED ORDER — DIAZEPAM 5 MG PO TABS
10.0000 mg | ORAL_TABLET | Freq: Once | ORAL | Status: AC
  Administered 2019-04-08: 09:00:00 10 mg via ORAL

## 2019-04-08 MED ORDER — IOHEXOL 180 MG/ML  SOLN
20.0000 mL | Freq: Once | INTRAMUSCULAR | Status: AC | PRN
  Administered 2019-04-08: 10:00:00 12 mL via INTRATHECAL

## 2019-04-08 MED ORDER — OXYCODONE-ACETAMINOPHEN 5-325 MG PO TABS: 1.0000 | ORAL_TABLET | ORAL | Status: DC | PRN

## 2019-04-08 NOTE — Procedures (Signed)
Patient is an elderly female  With history of lumbar decompression and fusion from T10 to sacrum. Now experiencing worsening right greater than left pain and numbness. For myelogram.  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

## 2019-04-08 NOTE — Discharge Instructions (Signed)
Myelogram  A myelogram is an imaging test. This test checks for problems in the spinal cord and the places where nerves attach to the spinal cord (nerve roots). A dye (contrast material) is put into your spine before the X-ray. This provides a clearer image for your doctor to see. You may need this test if you have a spinal cord problem that cannot be diagnosed with other imaging tests. You may also have this test to check your spine after surgery. Tell a doctor about:  Any allergies you have, especially to iodine.  All medicines you are taking, including vitamins, herbs, eye drops, creams, and over-the-counter medicines.  Any problems you or family members have had with anesthetic medicines or dye.  Any blood disorders you have.  Any surgeries you have had.  Any medical conditions you have or have had, including asthma.  Whether you are pregnant or may be pregnant. What are the risks? Generally, this is a safe procedure. However, problems may occur, including:  Infection.  Bleeding.  Allergic reaction to medicines or dyes.  Damage to your spinal cord or nerves.  Leaking of spinal fluid. This can cause a headache.  Damage to kidneys.  Seizures. This is rare. What happens before the procedure?  Follow instructions from your doctor about what you cannot eat or drink. You may be asked to drink more fluids.  Ask your doctor about changing or stopping your normal medicines. This is important if you take diabetes medicines or blood thinners.  Plan to have someone take you home from the hospital or clinic.  If you will be going home right after the procedure, plan to have someone with you for 24 hours. What happens during the procedure?  You will lie face down on a table.  Your doctor will find the best injection site on your spine. This is most often in the lower back.  This area will be washed with soap.  You will be given a medicine to numb the area (local  anesthetic).  Your doctor will place a long needle into the space around your spinal cord.  A sample of spinal fluid may be taken. This may be sent to the lab for testing.  The dye will be injected into the space around your spinal cord.  The exam table may be tilted. This helps the dye flow up or down your spine.  The X-ray will take images of your spinal cord.  A bandage (dressing) may be placed over the area where the dye was injected. The procedure may vary among doctors and hospitals. What can I expect after the procedure?  You may be monitored until you leave the hospital or clinic. This includes checking your blood pressure, heart rate, breathing rate, and blood oxygen level.  You may feel sore at the injection site. You may have a mild headache.  You will be told to lie flat with your head raised (elevated). This lowers the risk of a headache.  It is up to you to get the results of your procedure. Ask your doctor, or the department that is doing the procedure, when your results will be ready. Follow these instructions at home:   Rest as told by your doctor. Lie flat with your head slightly elevated.  Do not bend, lift, or do hard work for 24-48 hours, or as told by your doctor.  Take over-the-counter and prescription medicines only as told by your doctor.  Take care of your bandage as told by your   doctor.  Drink enough fluid to keep your pee (urine) pale yellow.  Bathe or shower as told by your doctor. Contact a doctor if:  You have a fever.  You have a headache that lasts longer than 24 hours.  You feel sick to your stomach (nauseous).  You vomit.  Your neck is stiff.  Your legs feel numb.  You cannot pee.  You cannot poop (no bowel movement).  You have a rash.  You are itchy or sneezing. Get help right away if:  You have new symptoms or your symptoms get worse.  You have a seizure.  You have trouble breathing. Summary  A myelogram is an  imaging test that checks for problems in the spinal cord and the places where nerves attach to the spinal cord (nerve roots).  Before the procedure, follow instructions from your doctor. You will be told what not to eat or drink, or what medicines to change or stop.  After the procedure, you will be told to lie flat with your head raised (elevated). This will lower your risk of a headache.  Do not bend, lift, or do any hard work for 24-48 hours, or as told by your doctor.  Contact a doctor if you have a stiff neck or numb legs. Get help right away if your symptoms get worse, or you have a seizure or trouble breathing. This information is not intended to replace advice given to you by your health care provider. Make sure you discuss any questions you have with your health care provider. Document Revised: 05/30/2018 Document Reviewed: 05/31/2018 Elsevier Patient Education  2020 [employer] Inc.  

## 2019-04-23 ENCOUNTER — Ambulatory Visit: Payer: Medicare Other

## 2019-04-24 ENCOUNTER — Ambulatory Visit: Payer: Medicare Other | Attending: Internal Medicine

## 2019-04-24 ENCOUNTER — Other Ambulatory Visit: Payer: Self-pay

## 2019-04-24 DIAGNOSIS — Z23 Encounter for immunization: Secondary | ICD-10-CM | POA: Insufficient documentation

## 2019-04-24 NOTE — Progress Notes (Signed)
   Covid-19 Vaccination Clinic  Name:  [name]    MRN: [id number] DOB: 06-Dec-1932  04/24/2019  [name] was observed post Covid-19 immunization for 15 minutes without incidence. She was provided with Vaccine Information Sheet and instruction to access the V-Safe system.   [name] was instructed to call 911 with any severe reactions post vaccine: Marland Kitchen Difficulty breathing  . Swelling of your face and throat  . A fast heartbeat  . A bad rash all over your body  . Dizziness and weakness    Immunizations Administered    Name Date Dose VIS Date Route   Pfizer COVID-19 Vaccine 04/24/2019 12:45 PM 0.3 mL 03/15/2019 Intramuscular   Manufacturer: [place]   Lot: [id number]   [place]: [id number]

## 2019-05-15 ENCOUNTER — Ambulatory Visit: Payer: Medicare Other | Attending: Internal Medicine

## 2019-05-15 DIAGNOSIS — Z23 Encounter for immunization: Secondary | ICD-10-CM | POA: Insufficient documentation

## 2019-05-15 NOTE — Progress Notes (Signed)
   Covid-19 Vaccination Clinic  Name:  [name]    MRN: [id number] DOB: 08-16-1932  05/15/2019  Ms. Heaslip was observed post Covid-19 immunization for 15 minutes without incidence. She was provided with Vaccine Information Sheet and instruction to access the V-Safe system.   Ms. Franz was instructed to call 911 with any severe reactions post vaccine: Marland Kitchen Difficulty breathing  . Swelling of your face and throat  . A fast heartbeat  . A bad rash all over your body  . Dizziness and weakness    Immunizations Administered    Name Date Dose VIS Date Route   Pfizer COVID-19 Vaccine 05/15/2019 11:28 AM 0.3 mL 03/15/2019 Intramuscular   Manufacturer: [place]   Lot: [id number]   [place]: [id number]

## 2019-10-14 IMAGING — DX DG PELVIS 1-2V
1 series · 1 of 1 positions shown · non-contrast
Comparison: None.

CLINICAL DATA: Left hip pain following L2 through L5 posterior
lumbar spine fusion, performed on 06/13/2017.

EXAM:
PELVIS - 1-2 VIEW

[pelvis ap]
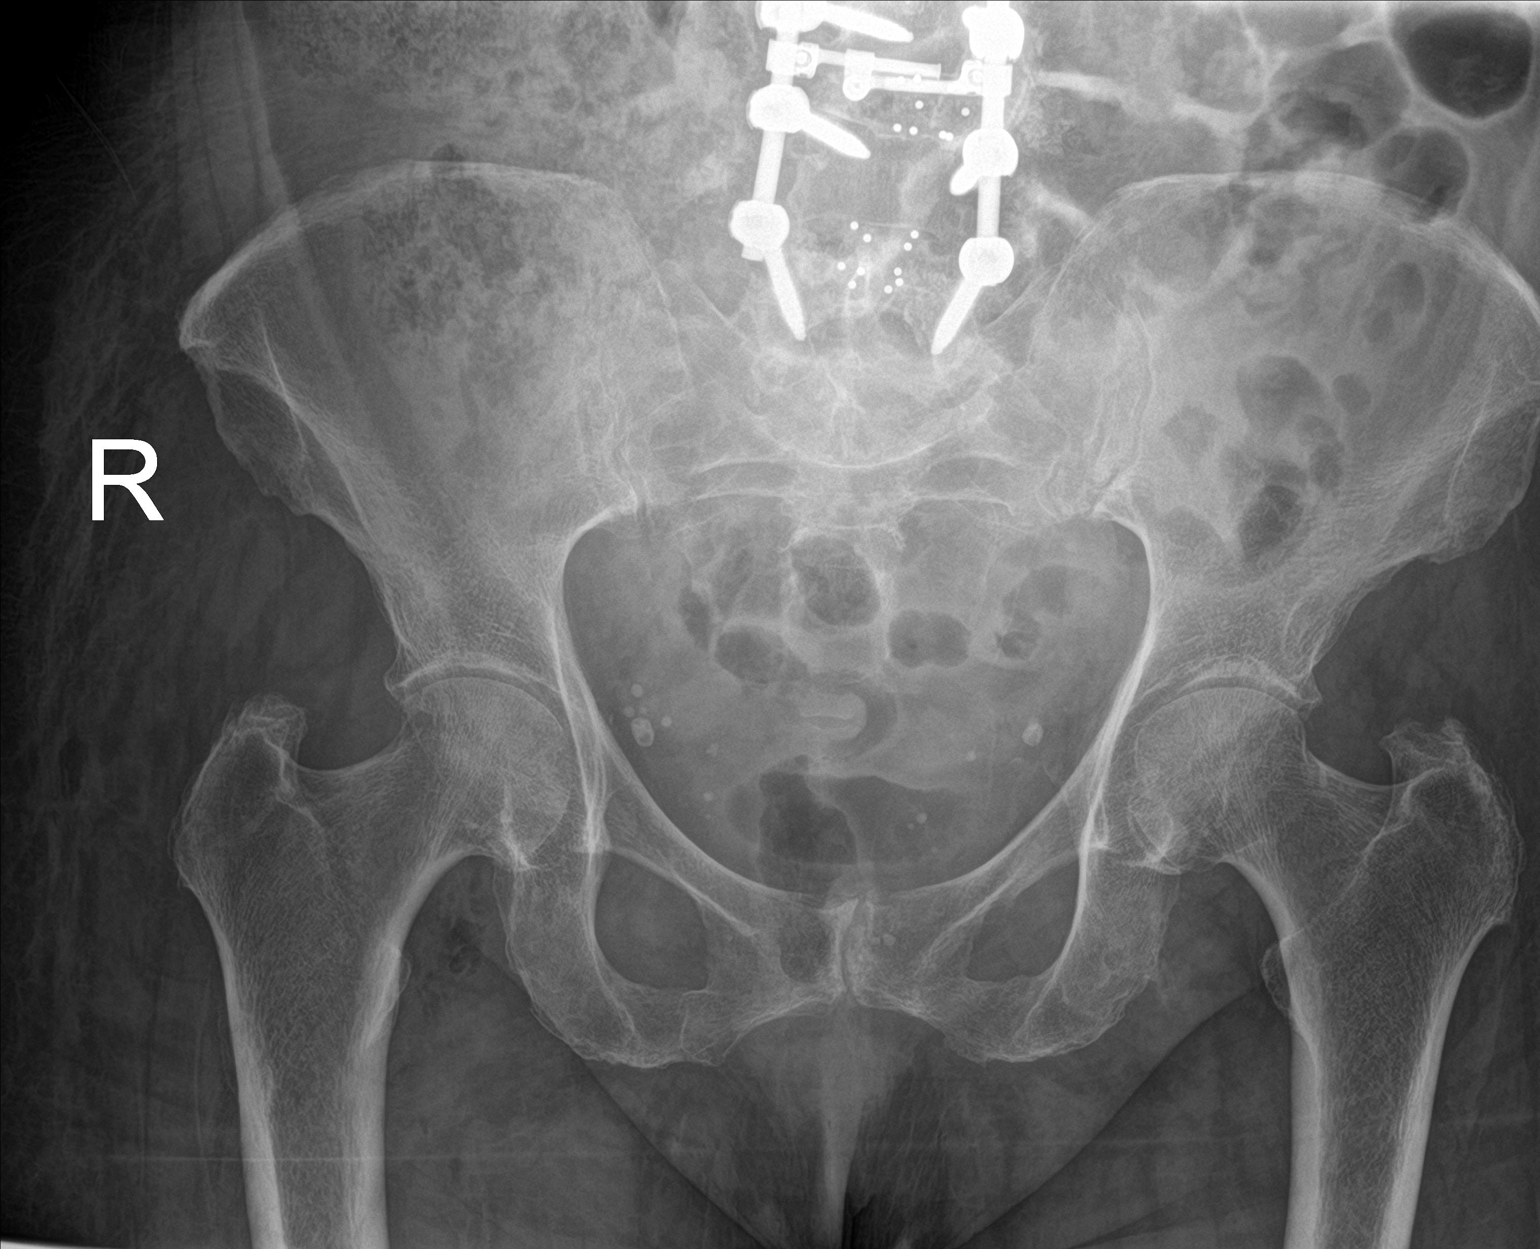

[1 of 1 positions shown; findings below may reference images not displayed]

FINDINGS: No fracture or bone lesion.

Mild concentric left hip joint space narrowing. No other
arthropathic changes. Right hip joint, SI joints and symphysis pubis
are normally spaced and aligned.

New posterior lumbar spine fusion hardware is incompletely imaged.
Where seen it appears well positioned.

Bones are demineralized.

Soft tissues are unremarkable.
IMPRESSION: 1. No fracture or acute finding.
2. Mild concentric hip joint space narrowing on the left with no
other arthropathic changes.

## 2019-11-12 ENCOUNTER — Other Ambulatory Visit: Payer: Self-pay | Admitting: Orthopedic Surgery

## 2019-11-12 DIAGNOSIS — R2230 Localized swelling, mass and lump, unspecified upper limb: Secondary | ICD-10-CM

## 2019-11-19 ENCOUNTER — Ambulatory Visit
Admission: RE | Admit: 2019-11-19 | Discharge: 2019-11-19 | Disposition: A | Discharge status: Home / Self Care | Payer: Medicare Other | Source: Ambulatory Visit | Attending: Orthopedic Surgery | Admitting: Orthopedic Surgery

## 2019-11-19 ENCOUNTER — Other Ambulatory Visit: Payer: Self-pay

## 2019-11-19 DIAGNOSIS — R2230 Localized swelling, mass and lump, unspecified upper limb: Secondary | ICD-10-CM

## 2019-11-25 ENCOUNTER — Other Ambulatory Visit: Payer: Self-pay | Admitting: Orthopedic Surgery

## 2019-12-17 NOTE — Patient Instructions (Addendum)
DUE TO COVID-19 ONLY ONE VISITOR IS ALLOWED TO COME WITH YOU AND STAY IN THE WAITING ROOM ONLY DURING PRE OP AND PROCEDURE DAY OF SURGERY. THE 1 VISITOR  MAY VISIT WITH YOU AFTER SURGERY IN YOUR PRIVATE ROOM DURING VISITING HOURS ONLY!  YOU NEED TO HAVE A COVID 19 TEST ON: 12/19/19 @ 2:45 pm , THIS TEST MUST BE DONE BEFORE SURGERY,  COVID TESTING SITE [place] [address], IT IS ON THE RIGHT GOING OUT [address] APPROXIMATELY  2 MINUTES PAST [employer] ON THE RIGHT. ONCE YOUR COVID TEST IS COMPLETED,  PLEASE BEGIN THE QUARANTINE INSTRUCTIONS AS OUTLINED IN YOUR HANDOUT.                [name]    Your procedure is scheduled on: 12/23/19   Report to [facility] Main  Entrance   Report to admitting at: 9:30 AM     Call this number if you have problems the morning of surgery [phone]    Remember:   NO SOLID FOOD AFTER MIDNIGHT THE NIGHT PRIOR TO SURGERY. NOTHING BY MOUTH EXCEPT CLEAR LIQUIDS UNTIL: 9:00 AM . PLEASE FINISH ENSURE DRINK PER SURGEON ORDER  WHICH NEEDS TO BE COMPLETED AT: 9:00 AM .   CLEAR LIQUID DIET   Foods Allowed                                                                     Foods Excluded  Coffee and tea, regular and decaf                             liquids that you cannot  Plain Jell-O any favor except red or purple                                           see through such as: Fruit ices (not with fruit pulp)                                     milk, soups, orange juice  Iced Popsicles                                    All solid food Carbonated beverages, regular and diet                                    Cranberry, grape and apple juices Sports drinks like Gatorade Lightly seasoned clear broth or consume(fat free) Sugar, honey syrup  Sample Menu Breakfast                                Lunch  Supper Cranberry juice                    Beef broth                             Chicken broth Jell-O                                     Grape juice                           Apple juice Coffee or tea                        Jell-O                                      Popsicle                                                Coffee or tea                        Coffee or tea  _____________________________________________________________________   BRUSH YOUR TEETH MORNING OF SURGERY AND RINSE YOUR MOUTH OUT, NO CHEWING GUM CANDY OR MINTS.     Take these medicines the morning of surgery with A SIP OF WATER: levothyroxine.                               You may not have any metal on your body including hair pins and              piercings  Do not wear jewelry, make-up, lotions, powders or perfumes, deodorant             Do not wear nail polish on your fingernails.  Do not shave  48 hours prior to surgery.            Do not bring valuables to the hospital. [place].  Contacts, dentures or bridgework may not be worn into surgery.  Leave suitcase in the car. After surgery it may be brought to your room.     Patients discharged the day of surgery will not be allowed to drive home. IF YOU ARE HAVING SURGERY AND GOING HOME THE SAME DAY, YOU MUST HAVE AN ADULT TO DRIVE YOU HOME AND BE WITH YOU FOR 24 HOURS. YOU MAY GO HOME BY TAXI OR UBER OR ORTHERWISE, BUT AN ADULT MUST ACCOMPANY YOU HOME AND STAY WITH YOU FOR 24 HOURS.  Name and phone number of your driver:  Special Instructions: N/A              Please read over the following fact sheets you were given: _____________________________________________________________________         [facility] - Preparing for Surgery Before surgery, you can play an important role.  Because skin is not sterile, your skin needs to be  as free of germs as possible.  You can reduce the number of germs on your skin by washing with CHG (chlorahexidine gluconate) soap before surgery.   CHG is an antiseptic cleaner which kills germs and bonds with the skin to continue killing germs even after washing. Please DO NOT use if you have an allergy to CHG or antibacterial soaps.  If your skin becomes reddened/irritated stop using the CHG and inform your nurse when you arrive at Short Stay. Do not shave (including legs and underarms) for at least 48 hours prior to the first CHG shower.  You may shave your face/neck. Please follow these instructions carefully:  1.  Shower with CHG Soap the night before surgery and the  morning of Surgery.  2.  If you choose to wash your hair, wash your hair first as usual with your  normal  shampoo.  3.  After you shampoo, rinse your hair and body thoroughly to remove the  shampoo.                           4.  Use CHG as you would any other liquid soap.  You can apply chg directly  to the skin and wash                       Gently with a scrungie or clean washcloth.  5.  Apply the CHG Soap to your body ONLY FROM THE NECK DOWN.   Do not use on face/ open                           Wound or open sores. Avoid contact with eyes, ears mouth and genitals (private parts).                       Wash face,  Genitals (private parts) with your normal soap.             6.  Wash thoroughly, paying special attention to the area where your surgery  will be performed.  7.  Thoroughly rinse your body with warm water from the neck down.  8.  DO NOT shower/wash with your normal soap after using and rinsing off  the CHG Soap.                9.  Pat yourself dry with a clean towel.            10.  Wear clean pajamas.            11.  Place clean sheets on your bed the night of your first shower and do not  sleep with pets. Day of Surgery : Do not apply any lotions/deodorants the morning of surgery.  Please wear clean clothes to the hospital/surgery center.  FAILURE TO FOLLOW THESE INSTRUCTIONS MAY RESULT IN THE CANCELLATION OF YOUR SURGERY PATIENT  SIGNATURE_________________________________  NURSE SIGNATURE__________________________________  ________________________________________________________________________   [name]  An incentive spirometer is a tool that can help keep your lungs clear and active. This tool measures how well you are filling your lungs with each breath. Taking long deep breaths may help reverse or decrease the chance of developing breathing (pulmonary) problems (especially infection) following:  A long period of time when you are unable to move or be active. BEFORE THE PROCEDURE   If the spirometer includes an indicator to show your best  effort, your nurse or respiratory therapist will set it to a desired goal.  If possible, sit up straight or lean slightly forward. Try not to slouch.  Hold the incentive spirometer in an upright position. INSTRUCTIONS FOR USE  1. Sit on the edge of your bed if possible, or sit up as far as you can in bed or on a chair. 2. Hold the incentive spirometer in an upright position. 3. Breathe out normally. 4. Place the mouthpiece in your mouth and seal your lips tightly around it. 5. Breathe in slowly and as deeply as possible, raising the piston or the ball toward the top of the column. 6. Hold your breath for 3-5 seconds or for as long as possible. Allow the piston or ball to fall to the bottom of the column. 7. Remove the mouthpiece from your mouth and breathe out normally. 8. Rest for a few seconds and repeat Steps 1 through 7 at least 10 times every 1-2 hours when you are awake. Take your time and take a few normal breaths between deep breaths. 9. The spirometer may include an indicator to show your best effort. Use the indicator as a goal to work toward during each repetition. 10. After each set of 10 deep breaths, practice coughing to be sure your lungs are clear. If you have an incision (the cut made at the time of surgery), support your incision when coughing  by placing a pillow or rolled up towels firmly against it. Once you are able to get out of bed, walk around indoors and cough well. You may stop using the incentive spirometer when instructed by your caregiver.  RISKS AND COMPLICATIONS  Take your time so you do not get dizzy or light-headed.  If you are in pain, you may need to take or ask for pain medication before doing incentive spirometry. It is harder to take a deep breath if you are having pain. AFTER USE  Rest and breathe slowly and easily.  It can be helpful to keep track of a log of your progress. Your caregiver can provide you with a simple table to help with this. If you are using the spirometer at home, follow these instructions: [place] IF:   You are having difficultly using the spirometer.  You have trouble using the spirometer as often as instructed.  Your pain medication is not giving enough relief while using the spirometer.  You develop fever of 100.5 F (38.1 C) or higher. SEEK IMMEDIATE MEDICAL CARE IF:   You cough up bloody sputum that had not been present before.  You develop fever of 102 F (38.9 C) or greater.  You develop worsening pain at or near the incision site. MAKE SURE YOU:   Understand these instructions.  Will watch your condition.  Will get help right away if you are not doing well or get worse. Document Released: 08/01/2006 Document Revised: 06/13/2011 Document Reviewed: 10/02/2006 [facility] Patient Information 2014 [place], [place].   ________________________________________________________________________

## 2019-12-18 ENCOUNTER — Other Ambulatory Visit: Payer: Self-pay

## 2019-12-18 ENCOUNTER — Encounter (HOSPITAL_COMMUNITY)
Admission: RE | Admit: 2019-12-18 | Discharge: 2019-12-18 | Disposition: A | Discharge status: Home / Self Care | Payer: Medicare Other | Source: Ambulatory Visit | Attending: Orthopedic Surgery | Admitting: Orthopedic Surgery

## 2019-12-18 ENCOUNTER — Encounter (HOSPITAL_COMMUNITY): Payer: Self-pay

## 2019-12-18 DIAGNOSIS — Z01812 Encounter for preprocedural laboratory examination: Secondary | ICD-10-CM | POA: Diagnosis present

## 2019-12-18 LAB — CBC WITH DIFFERENTIAL/PLATELET
Abs Immature Granulocytes: 0.05 10*3/uL (ref 0.00–0.07)
Basophils Absolute: 0.1 10*3/uL (ref 0.0–0.1)
Basophils Relative: 1 %
Eosinophils Absolute: 0.1 10*3/uL (ref 0.0–0.5)
Eosinophils Relative: 2 %
HCT: 38.8 % (ref 36.0–46.0)
Hemoglobin: 12.7 g/dL (ref 12.0–15.0)
Immature Granulocytes: 1 %
Lymphocytes Relative: 27 %
Lymphs Abs: 1.9 10*3/uL (ref 0.7–4.0)
MCH: 31 pg (ref 26.0–34.0)
MCHC: 32.7 g/dL (ref 30.0–36.0)
MCV: 94.6 fL (ref 80.0–100.0)
Monocytes Absolute: 0.8 10*3/uL (ref 0.1–1.0)
Monocytes Relative: 11 %
Neutro Abs: 4.3 10*3/uL (ref 1.7–7.7)
Neutrophils Relative %: 58 %
Platelets: 323 10*3/uL (ref 150–400)
RBC: 4.1 MIL/uL (ref 3.87–5.11)
RDW: 13.5 % (ref 11.5–15.5)
WBC: 7.2 10*3/uL (ref 4.0–10.5)
nRBC: 0 % (ref 0.0–0.2)

## 2019-12-18 LAB — COMPREHENSIVE METABOLIC PANEL
ALT: 29 U/L (ref 0–44)
AST: 20 U/L (ref 15–41)
Albumin: 4 g/dL (ref 3.5–5.0)
Alkaline Phosphatase: 65 U/L (ref 38–126)
Anion gap: 7 (ref 5–15)
BUN: 17 mg/dL (ref 8–23)
CO2: 28 mmol/L (ref 22–32)
Calcium: 9.7 mg/dL (ref 8.9–10.3)
Chloride: 100 mmol/L (ref 98–111)
Creatinine, Ser: 0.73 mg/dL (ref 0.44–1.00)
GFR calc Af Amer: >60 mL/min (ref >60)
GFR calc non Af Amer: >60 mL/min (ref >60)
Glucose, Bld: 97 mg/dL (ref 70–99)
Potassium: 4.3 mmol/L (ref 3.5–5.1)
Sodium: 135 mmol/L (ref 135–145)
Total Bilirubin: 0.7 mg/dL (ref 0.3–1.2)
Total Protein: 7.1 g/dL (ref 6.5–8.1)

## 2019-12-18 LAB — SURGICAL PCR SCREEN
MRSA, PCR: NEGATIVE
Staphylococcus aureus: NEGATIVE

## 2019-12-18 NOTE — Progress Notes (Signed)
COVID Vaccine Completed:Yes Date COVID Vaccine completed:05/15/19 COVID vaccine manufacturer: [employer]      PCP - Dr. Lorene Dy Cardiologist - NO  Chest x-ray -  EKG -  Stress Test -  ECHO -  Cardiac Cath -  Pacemaker/ICD device last checked:  Sleep Study -  CPAP -   Fasting Blood Sugar -  Checks Blood Sugar _____ times a day  Blood Thinner Instructions: Aspirin Instructions: Last Dose:  Anesthesia review: Hx: DVT's(2019) post back surgery.  Patient denies shortness of breath, fever, cough and chest pain at PAT appointment   Patient verbalized understanding of instructions that were given to them at the PAT appointment. Patient was also instructed that they will need to review over the PAT instructions again at home before surgery.

## 2019-12-19 ENCOUNTER — Other Ambulatory Visit (HOSPITAL_COMMUNITY)
Admission: RE | Admit: 2019-12-19 | Discharge: 2019-12-19 | Disposition: A | Discharge status: Home / Self Care | Payer: Medicare Other | Source: Ambulatory Visit | Attending: Orthopedic Surgery | Admitting: Orthopedic Surgery

## 2019-12-19 DIAGNOSIS — Z20822 Contact with and (suspected) exposure to covid-19: Secondary | ICD-10-CM | POA: Insufficient documentation

## 2019-12-19 DIAGNOSIS — Z01812 Encounter for preprocedural laboratory examination: Secondary | ICD-10-CM | POA: Diagnosis present

## 2019-12-19 LAB — SARS CORONAVIRUS 2 (TAT 6-24 HRS): SARS Coronavirus 2: NEGATIVE

## 2019-12-22 ENCOUNTER — Encounter (HOSPITAL_COMMUNITY): Payer: Self-pay | Admitting: Orthopedic Surgery

## 2019-12-22 MED ORDER — BUPIVACAINE LIPOSOME 1.3 % IJ SUSP
20.0000 mL | Freq: Once | INTRAMUSCULAR | Status: DC
  Filled 2019-12-22: qty 20

## 2019-12-23 ENCOUNTER — Ambulatory Visit (HOSPITAL_COMMUNITY): Payer: Medicare Other | Admitting: Anesthesiology

## 2019-12-23 ENCOUNTER — Observation Stay (HOSPITAL_COMMUNITY)
Admission: RE | Admit: 2019-12-23 | Discharge: 2019-12-24 | Disposition: A | Discharge status: Home / Self Care | Payer: Medicare Other | Attending: Orthopedic Surgery | Admitting: Orthopedic Surgery

## 2019-12-23 ENCOUNTER — Telehealth (HOSPITAL_COMMUNITY): Payer: Self-pay | Admitting: *Deleted

## 2019-12-23 ENCOUNTER — Encounter (HOSPITAL_COMMUNITY): Payer: Self-pay | Admitting: Orthopedic Surgery

## 2019-12-23 ENCOUNTER — Encounter (HOSPITAL_COMMUNITY)
Admission: RE | Disposition: A | Discharge status: Home / Self Care | Payer: Self-pay | Source: Home / Self Care | Attending: Orthopedic Surgery

## 2019-12-23 ENCOUNTER — Other Ambulatory Visit: Payer: Self-pay

## 2019-12-23 DIAGNOSIS — M1711 Unilateral primary osteoarthritis, right knee: Principal | ICD-10-CM | POA: Insufficient documentation

## 2019-12-23 DIAGNOSIS — M5030 Other cervical disc degeneration, unspecified cervical region: Secondary | ICD-10-CM | POA: Diagnosis not present

## 2019-12-23 DIAGNOSIS — E039 Hypothyroidism, unspecified: Secondary | ICD-10-CM | POA: Diagnosis not present

## 2019-12-23 DIAGNOSIS — Z96651 Presence of right artificial knee joint: Secondary | ICD-10-CM

## 2019-12-23 DIAGNOSIS — K219 Gastro-esophageal reflux disease without esophagitis: Secondary | ICD-10-CM | POA: Diagnosis not present

## 2019-12-23 DIAGNOSIS — I10 Essential (primary) hypertension: Secondary | ICD-10-CM | POA: Insufficient documentation

## 2019-12-23 DIAGNOSIS — Z90710 Acquired absence of both cervix and uterus: Secondary | ICD-10-CM | POA: Insufficient documentation

## 2019-12-23 DIAGNOSIS — Z87891 Personal history of nicotine dependence: Secondary | ICD-10-CM | POA: Insufficient documentation

## 2019-12-23 DIAGNOSIS — Z86718 Personal history of other venous thrombosis and embolism: Secondary | ICD-10-CM | POA: Insufficient documentation

## 2019-12-23 DIAGNOSIS — M25561 Pain in right knee: Secondary | ICD-10-CM | POA: Diagnosis present

## 2019-12-23 HISTORY — PX: TOTAL KNEE ARTHROPLASTY: SHX125

## 2019-12-23 SURGERY — ARTHROPLASTY, KNEE, TOTAL
Anesthesia: Spinal | Site: Knee | Laterality: Right

## 2019-12-23 MED ORDER — CEFAZOLIN SODIUM-DEXTROSE 2-4 GM/100ML-% IV SOLN
INTRAVENOUS | Status: AC
  Filled 2019-12-23: qty 100

## 2019-12-23 MED ORDER — HYDROMORPHONE HCL 1 MG/ML IJ SOLN: 0.5000 mg | INTRAMUSCULAR | Status: DC | PRN

## 2019-12-23 MED ORDER — BUPIVACAINE LIPOSOME 1.3 % IJ SUSP
INTRAMUSCULAR | Status: DC | PRN
  Administered 2019-12-23: 20 mL

## 2019-12-23 MED ORDER — PROPOFOL 10 MG/ML IV BOLUS
INTRAVENOUS | Status: DC | PRN
  Administered 2019-12-23: 10 mg via INTRAVENOUS

## 2019-12-23 MED ORDER — OXYCODONE HCL 5 MG PO TABS
10.0000 mg | ORAL_TABLET | ORAL | Status: DC | PRN
  Administered 2019-12-23: 5 mg via ORAL

## 2019-12-23 MED ORDER — TRANEXAMIC ACID-NACL 1000-0.7 MG/100ML-% IV SOLN
1000.0000 mg | INTRAVENOUS | Status: AC
  Administered 2019-12-23: 1000 mg via INTRAVENOUS
  Filled 2019-12-23: qty 100

## 2019-12-23 MED ORDER — LACTATED RINGERS IV SOLN: INTRAVENOUS | Status: DC

## 2019-12-23 MED ORDER — DEXAMETHASONE SODIUM PHOSPHATE 10 MG/ML IJ SOLN
INTRAMUSCULAR | Status: DC | PRN
  Administered 2019-12-23: 8 mg via INTRAVENOUS

## 2019-12-23 MED ORDER — PROPOFOL 500 MG/50ML IV EMUL
INTRAVENOUS | Status: DC | PRN
  Administered 2019-12-23: 55 ug/kg/min via INTRAVENOUS

## 2019-12-23 MED ORDER — FENTANYL CITRATE (PF) 100 MCG/2ML IJ SOLN
INTRAMUSCULAR | Status: DC | PRN
Start: 2019-12-23 — End: 2019-12-23
  Administered 2019-12-23: 25 ug via INTRAVENOUS

## 2019-12-23 MED ORDER — GABAPENTIN 300 MG PO CAPS
300.0000 mg | ORAL_CAPSULE | Freq: Once | ORAL | Status: AC
  Administered 2019-12-23: 300 mg via ORAL
  Filled 2019-12-23: qty 1

## 2019-12-23 MED ORDER — ALUM & MAG HYDROXIDE-SIMETH 200-200-20 MG/5ML PO SUSP: 30.0000 mL | ORAL | Status: DC | PRN

## 2019-12-23 MED ORDER — ROPIVACAINE HCL 7.5 MG/ML IJ SOLN
INTRAMUSCULAR | Status: DC | PRN
  Administered 2019-12-23: 20 mL via PERINEURAL

## 2019-12-23 MED ORDER — ACETAMINOPHEN 500 MG PO TABS
1000.0000 mg | ORAL_TABLET | Freq: Every day | ORAL | Status: DC
  Administered 2019-12-23: 1000 mg via ORAL
  Filled 2019-12-23: qty 2

## 2019-12-23 MED ORDER — ONDANSETRON HCL 4 MG/2ML IJ SOLN: 4.0000 mg | Freq: Once | INTRAMUSCULAR | Status: DC | PRN

## 2019-12-23 MED ORDER — TRANEXAMIC ACID-NACL 1000-0.7 MG/100ML-% IV SOLN
INTRAVENOUS | Status: AC
  Administered 2019-12-23: 1000 mg via INTRAVENOUS
  Filled 2019-12-23: qty 100

## 2019-12-23 MED ORDER — OXYCODONE HCL 5 MG PO TABS: 5.0000 mg | ORAL_TABLET | ORAL | Status: DC | PRN

## 2019-12-23 MED ORDER — OXYCODONE HCL 5 MG PO TABS
5.0000 mg | ORAL_TABLET | ORAL | 0 refills | Status: DC | PRN
Start: 2019-12-23 — End: 2020-10-29

## 2019-12-23 MED ORDER — TIZANIDINE HCL 2 MG PO TABS: 2.0000 mg | ORAL_TABLET | Freq: Four times a day (QID) | ORAL | 0 refills | Status: DC | PRN

## 2019-12-23 MED ORDER — ONDANSETRON HCL 4 MG PO TABS: 4.0000 mg | ORAL_TABLET | Freq: Four times a day (QID) | ORAL | Status: DC | PRN

## 2019-12-23 MED ORDER — LIDOCAINE 2% (20 MG/ML) 5 ML SYRINGE
INTRAMUSCULAR | Status: DC | PRN
  Administered 2019-12-23: 40 mg via INTRAVENOUS

## 2019-12-23 MED ORDER — ZOLPIDEM TARTRATE 5 MG PO TABS: 5.0000 mg | ORAL_TABLET | Freq: Every evening | ORAL | Status: DC | PRN

## 2019-12-23 MED ORDER — SODIUM CHLORIDE (PF) 0.9 % IJ SOLN
INTRAMUSCULAR | Status: AC
  Filled 2019-12-23: qty 20

## 2019-12-23 MED ORDER — DEXAMETHASONE SODIUM PHOSPHATE 10 MG/ML IJ SOLN
10.0000 mg | Freq: Once | INTRAMUSCULAR | Status: AC
  Administered 2019-12-24: 10 mg via INTRAVENOUS
  Filled 2019-12-23: qty 1

## 2019-12-23 MED ORDER — LACTATED RINGERS IV BOLUS
500.0000 mL | Freq: Once | INTRAVENOUS | Status: AC
  Administered 2019-12-23: 500 mL via INTRAVENOUS

## 2019-12-23 MED ORDER — SODIUM CHLORIDE 0.9% FLUSH
INTRAVENOUS | Status: DC | PRN
  Administered 2019-12-23: 20 mL

## 2019-12-23 MED ORDER — FERROUS SULFATE 325 (65 FE) MG PO TABS
325.0000 mg | ORAL_TABLET | Freq: Three times a day (TID) | ORAL | Status: DC
  Administered 2019-12-24: 325 mg via ORAL
  Filled 2019-12-23: qty 1

## 2019-12-23 MED ORDER — OXYCODONE HCL 5 MG PO TABS
5.0000 mg | ORAL_TABLET | Freq: Once | ORAL | Status: AC | PRN
  Administered 2019-12-23: 5 mg via ORAL

## 2019-12-23 MED ORDER — ASPIRIN EC 325 MG PO TBEC
325.0000 mg | DELAYED_RELEASE_TABLET | Freq: Two times a day (BID) | ORAL | Status: DC
  Administered 2019-12-24: 325 mg via ORAL
  Filled 2019-12-23: qty 1

## 2019-12-23 MED ORDER — OXYCODONE HCL 5 MG PO TABS
ORAL_TABLET | ORAL | Status: AC
Start: 2019-12-23 — End: 2019-12-24
  Filled 2019-12-23: qty 1

## 2019-12-23 MED ORDER — ORAL CARE MOUTH RINSE: 15.0000 mL | Freq: Once | OROMUCOSAL | Status: AC

## 2019-12-23 MED ORDER — DIPHENHYDRAMINE-APAP (SLEEP) 25-500 MG PO TABS: 2.0000 | ORAL_TABLET | Freq: Every day | ORAL | Status: DC

## 2019-12-23 MED ORDER — LACTATED RINGERS IV BOLUS
250.0000 mL | Freq: Once | INTRAVENOUS | Status: AC
  Administered 2019-12-23: 250 mL via INTRAVENOUS

## 2019-12-23 MED ORDER — OXYCODONE HCL 5 MG/5ML PO SOLN: 5.0000 mg | Freq: Once | ORAL | Status: AC | PRN

## 2019-12-23 MED ORDER — ACETAMINOPHEN 325 MG PO TABS: 325.0000 mg | ORAL_TABLET | Freq: Four times a day (QID) | ORAL | Status: DC | PRN

## 2019-12-23 MED ORDER — ONDANSETRON HCL 4 MG/2ML IJ SOLN
INTRAMUSCULAR | Status: DC | PRN
  Administered 2019-12-23: 4 mg via INTRAVENOUS

## 2019-12-23 MED ORDER — SODIUM CHLORIDE 0.9 % IR SOLN
Status: DC | PRN
  Administered 2019-12-23: 1000 mL

## 2019-12-23 MED ORDER — 0.9 % SODIUM CHLORIDE (POUR BTL) OPTIME
TOPICAL | Status: DC | PRN
  Administered 2019-12-23: 1000 mL

## 2019-12-23 MED ORDER — DIPHENHYDRAMINE HCL 12.5 MG/5ML PO ELIX: 12.5000 mg | ORAL_SOLUTION | ORAL | Status: DC | PRN

## 2019-12-23 MED ORDER — DOCUSATE SODIUM 100 MG PO CAPS
100.0000 mg | ORAL_CAPSULE | Freq: Two times a day (BID) | ORAL | Status: DC
  Administered 2019-12-23 – 2019-12-24 (×2): 100 mg via ORAL
  Filled 2019-12-23 (×2): qty 1

## 2019-12-23 MED ORDER — FLEET ENEMA 7-19 GM/118ML RE ENEM: 1.0000 | ENEMA | Freq: Once | RECTAL | Status: DC | PRN

## 2019-12-23 MED ORDER — WATER FOR IRRIGATION, STERILE IR SOLN
Status: DC | PRN
  Administered 2019-12-23: 2000 mL

## 2019-12-23 MED ORDER — ONDANSETRON HCL 4 MG/2ML IJ SOLN: 4.0000 mg | Freq: Four times a day (QID) | INTRAMUSCULAR | Status: DC | PRN

## 2019-12-23 MED ORDER — CELECOXIB 200 MG PO CAPS
400.0000 mg | ORAL_CAPSULE | Freq: Once | ORAL | Status: AC
  Administered 2019-12-23: 400 mg via ORAL
  Filled 2019-12-23: qty 2

## 2019-12-23 MED ORDER — POVIDONE-IODINE 10 % EX SWAB
2.0000 "application " | Freq: Once | CUTANEOUS | Status: AC
  Administered 2019-12-23: 2 via TOPICAL

## 2019-12-23 MED ORDER — PHENOL 1.4 % MT LIQD: 1.0000 | OROMUCOSAL | Status: DC | PRN

## 2019-12-23 MED ORDER — CHLORHEXIDINE GLUCONATE 0.12 % MT SOLN
15.0000 mL | Freq: Once | OROMUCOSAL | Status: AC
  Administered 2019-12-23: 15 mL via OROMUCOSAL

## 2019-12-23 MED ORDER — ACETAMINOPHEN 500 MG PO TABS
1000.0000 mg | ORAL_TABLET | Freq: Once | ORAL | Status: AC
  Administered 2019-12-23: 1000 mg via ORAL
  Filled 2019-12-23: qty 2

## 2019-12-23 MED ORDER — LEVOTHYROXINE SODIUM 50 MCG PO TABS
50.0000 ug | ORAL_TABLET | Freq: Every day | ORAL | Status: DC
  Administered 2019-12-24: 50 ug via ORAL
  Filled 2019-12-23: qty 1

## 2019-12-23 MED ORDER — FENTANYL CITRATE (PF) 100 MCG/2ML IJ SOLN
INTRAMUSCULAR | Status: AC
  Filled 2019-12-23: qty 2

## 2019-12-23 MED ORDER — DIPHENHYDRAMINE HCL 25 MG PO CAPS
50.0000 mg | ORAL_CAPSULE | Freq: Every day | ORAL | Status: DC
  Administered 2019-12-23: 50 mg via ORAL
  Filled 2019-12-23: qty 2

## 2019-12-23 MED ORDER — ASPIRIN EC 325 MG PO TBEC: 325.0000 mg | DELAYED_RELEASE_TABLET | Freq: Two times a day (BID) | ORAL | 0 refills | Status: AC

## 2019-12-23 MED ORDER — TRAMADOL HCL 50 MG PO TABS
50.0000 mg | ORAL_TABLET | Freq: Four times a day (QID) | ORAL | Status: DC
  Administered 2019-12-23 – 2019-12-24 (×3): 50 mg via ORAL
  Filled 2019-12-23 (×3): qty 1

## 2019-12-23 MED ORDER — DEXAMETHASONE SODIUM PHOSPHATE 10 MG/ML IJ SOLN: 8.0000 mg | Freq: Once | INTRAMUSCULAR | Status: DC

## 2019-12-23 MED ORDER — SODIUM CHLORIDE 0.9 % IV SOLN: INTRAVENOUS | Status: DC

## 2019-12-23 MED ORDER — BUPIVACAINE IN DEXTROSE 0.75-8.25 % IT SOLN
INTRATHECAL | Status: DC | PRN
  Administered 2019-12-23: 1.6 mL via INTRATHECAL

## 2019-12-23 MED ORDER — FENTANYL CITRATE (PF) 100 MCG/2ML IJ SOLN
50.0000 ug | INTRAMUSCULAR | Status: DC
  Administered 2019-12-23: 50 ug via INTRAVENOUS
  Filled 2019-12-23: qty 2

## 2019-12-23 MED ORDER — MENTHOL 3 MG MT LOZG: 1.0000 | LOZENGE | OROMUCOSAL | Status: DC | PRN

## 2019-12-23 MED ORDER — CEFAZOLIN SODIUM-DEXTROSE 2-4 GM/100ML-% IV SOLN
2.0000 g | Freq: Four times a day (QID) | INTRAVENOUS | Status: AC
  Administered 2019-12-23 (×2): 2 g via INTRAVENOUS
  Filled 2019-12-23: qty 100

## 2019-12-23 MED ORDER — PROPOFOL 1000 MG/100ML IV EMUL
INTRAVENOUS | Status: AC
  Filled 2019-12-23: qty 100

## 2019-12-23 MED ORDER — CEFAZOLIN SODIUM-DEXTROSE 2-4 GM/100ML-% IV SOLN
2.0000 g | INTRAVENOUS | Status: AC
  Administered 2019-12-23: 2 g via INTRAVENOUS
  Filled 2019-12-23: qty 100

## 2019-12-23 MED ORDER — PANTOPRAZOLE SODIUM 40 MG PO TBEC
40.0000 mg | DELAYED_RELEASE_TABLET | Freq: Every day | ORAL | Status: DC
  Administered 2019-12-24: 40 mg via ORAL
  Filled 2019-12-23: qty 1

## 2019-12-23 MED ORDER — METOCLOPRAMIDE HCL 5 MG PO TABS: 5.0000 mg | ORAL_TABLET | Freq: Three times a day (TID) | ORAL | Status: DC | PRN

## 2019-12-23 MED ORDER — BUPIVACAINE-EPINEPHRINE 0.25% -1:200000 IJ SOLN
INTRAMUSCULAR | Status: DC | PRN
  Administered 2019-12-23: 30 mL

## 2019-12-23 MED ORDER — FENTANYL CITRATE (PF) 100 MCG/2ML IJ SOLN: 25.0000 ug | INTRAMUSCULAR | Status: DC | PRN

## 2019-12-23 MED ORDER — SENNOSIDES-DOCUSATE SODIUM 8.6-50 MG PO TABS: 1.0000 | ORAL_TABLET | Freq: Every evening | ORAL | Status: DC | PRN

## 2019-12-23 MED ORDER — METOCLOPRAMIDE HCL 5 MG/ML IJ SOLN: 5.0000 mg | Freq: Three times a day (TID) | INTRAMUSCULAR | Status: DC | PRN

## 2019-12-23 MED ORDER — BISACODYL 5 MG PO TBEC: 5.0000 mg | DELAYED_RELEASE_TABLET | Freq: Every day | ORAL | Status: DC | PRN

## 2019-12-23 MED ORDER — TRANEXAMIC ACID-NACL 1000-0.7 MG/100ML-% IV SOLN
1000.0000 mg | Freq: Once | INTRAVENOUS | Status: AC
  Administered 2019-12-23: 1000 mg via INTRAVENOUS

## 2019-12-23 SURGICAL SUPPLY — 54 items
ARTISURF 12M PLY R 6-9CD KNEE (Knees) ×2 IMPLANT
BAG ZIPLOCK 12X15 (MISCELLANEOUS) ×2 IMPLANT
BLADE SAGITTAL 13X1.27X60 (BLADE) ×2 IMPLANT
BLADE SAW SGTL 83.5X18.5 (BLADE) ×2 IMPLANT
BLADE SURG 15 STRL LF DISP TIS (BLADE) ×1 IMPLANT
BLADE SURG 15 STRL SS (BLADE) ×2
BLADE SURG SZ10 CARB STEEL (BLADE) ×4 IMPLANT
BNDG ELASTIC 6X5.8 VLCR STR LF (GAUZE/BANDAGES/DRESSINGS) ×2 IMPLANT
BOWL SMART MIX CTS (DISPOSABLE) ×2 IMPLANT
CEMENT BONE SIMPLEX SPEEDSET (Cement) ×4 IMPLANT
COVER SURGICAL LIGHT HANDLE (MISCELLANEOUS) ×2 IMPLANT
COVER WAND RF STERILE (DRAPES) IMPLANT
CUFF TOURN SGL QUICK 34 (TOURNIQUET CUFF) ×2
CUFF TRNQT CYL 34X4.125X (TOURNIQUET CUFF) ×1 IMPLANT
DECANTER SPIKE VIAL GLASS SM (MISCELLANEOUS) ×4 IMPLANT
DRAPE INCISE IOBAN 66X45 STRL (DRAPES) ×4 IMPLANT
DRAPE U-SHAPE 47X51 STRL (DRAPES) ×2 IMPLANT
DRSG AQUACEL AG ADV 3.5X10 (GAUZE/BANDAGES/DRESSINGS) ×2 IMPLANT
DURAPREP 26ML APPLICATOR (WOUND CARE) ×4 IMPLANT
ELECT REM PT RETURN 15FT ADLT (MISCELLANEOUS) ×2 IMPLANT
FEMUR  CMT CCR STD SZ7 R KNEE (Knees) ×2 IMPLANT
FEMUR CMT CCR STD SZ7 R KNEE (Knees) ×1 IMPLANT
FEMUR CMTD CCR STD SZ7 R KNEE (Knees) ×1 IMPLANT
GLOVE BIOGEL PI IND STRL 8.5 (GLOVE) ×2 IMPLANT
GLOVE BIOGEL PI INDICATOR 8.5 (GLOVE) ×2
GLOVE SURG ORTHO 8.0 STRL STRW (GLOVE) ×6 IMPLANT
GOWN STRL REUS W/ TWL XL LVL3 (GOWN DISPOSABLE) ×2 IMPLANT
GOWN STRL REUS W/TWL XL LVL3 (GOWN DISPOSABLE) ×4
HANDPIECE INTERPULSE COAX TIP (DISPOSABLE) ×2
HOLDER FOLEY CATH W/STRAP (MISCELLANEOUS) ×2 IMPLANT
HOOD PEEL AWAY FLYTE STAYCOOL (MISCELLANEOUS) ×6 IMPLANT
KIT TURNOVER KIT A (KITS) IMPLANT
MANIFOLD NEPTUNE II (INSTRUMENTS) ×2 IMPLANT
NEEDLE HYPO 22GX1.5 SAFETY (NEEDLE) ×2 IMPLANT
NS IRRIG 1000ML POUR BTL (IV SOLUTION) ×2 IMPLANT
PACK TOTAL KNEE CUSTOM (KITS) ×2 IMPLANT
PENCIL SMOKE EVACUATOR (MISCELLANEOUS) ×2 IMPLANT
PROTECTOR NERVE ULNAR (MISCELLANEOUS) ×2 IMPLANT
SET HNDPC FAN SPRY TIP SCT (DISPOSABLE) ×1 IMPLANT
STEM POLY PAT PLY 32M KNEE (Knees) ×2 IMPLANT
STEM TIBIA 5 DEG SZ D R KNEE (Knees) ×1 IMPLANT
STRIP CLOSURE SKIN 1/2X4 (GAUZE/BANDAGES/DRESSINGS) ×4 IMPLANT
SUT BONE WAX W31G (SUTURE) ×2 IMPLANT
SUT MNCRL AB 3-0 PS2 18 (SUTURE) ×2 IMPLANT
SUT STRATAFIX 0 PDS 27 VIOLET (SUTURE) ×2
SUT STRATAFIX PDS+ 0 24IN (SUTURE) ×2 IMPLANT
SUT VIC AB 1 CT1 36 (SUTURE) ×2 IMPLANT
SUTURE STRATFX 0 PDS 27 VIOLET (SUTURE) ×1 IMPLANT
SYR CONTROL 10ML LL (SYRINGE) ×4 IMPLANT
TIBIA STEM 5 DEG SZ D R KNEE (Knees) ×2 IMPLANT
TRAY FOLEY MTR SLVR 16FR STAT (SET/KITS/TRAYS/PACK) ×2 IMPLANT
WATER STERILE IRR 1000ML POUR (IV SOLUTION) ×4 IMPLANT
WRAP KNEE MAXI GEL POST OP (GAUZE/BANDAGES/DRESSINGS) ×2 IMPLANT
YANKAUER SUCT BULB TIP 10FT TU (MISCELLANEOUS) ×2 IMPLANT

## 2019-12-23 NOTE — Progress Notes (Signed)
Orthopedic Tech Progress Note Patient Details:  [name] 06/17/1932 [id number]  CPM Right Knee CPM Right Knee: Off Right Knee Flexion (Degrees): 90 Right Knee Extension (Degrees): 0 Additional Comments: foot roll  Post Interventions Patient Tolerated: Well Instructions Provided: Care of device  [name] 12/23/2019, 5:56 PM

## 2019-12-23 NOTE — Progress Notes (Signed)
Physical Therapy Treatment Patient Details Name: [name] MRN: [id number] DOB: January 08, 1933 Today's Date: 12/23/2019    History of Present Illness Patient is 86 y.o. female s/p Rt TKA on 12/23/19 with PMH significant for vertigo, GERD, DVT, OA, Ptosis, T10-L5 fusion.    PT Comments    Patient seen for follow up session to progress mobility and provide family training in preparation for safe discharge home. Patient required min guard/supervision for gait, no overt LOB noted and pt's husband educated on safe guarding position for gait. Patient complete stair training with min guard/assist using one rail and SPC. Pt's husband educated on safe guarding and provided on one bout; both required repeated cues for sequencing. Pt continues to c/o numbness in Rt LE and feeling unstable and not confident about return home with this; pt also unable to void bladder in bathroom. Pt/spouse concerned about discharging late as well as with ongoing numbness in Rt LE and would prefer to stay the night and have additional therapy session tomorrow for mobility review. Acute PT will continue to progress independence with mobility as pt is able.    Follow Up Recommendations  Follow surgeon's recommendation for DC plan and follow-up therapies;Home health PT     Equipment Recommendations  None recommended by PT    Recommendations for Other Services       Precautions / Restrictions Precautions Precautions: Fall Restrictions Weight Bearing Restrictions: No Other Position/Activity Restrictions: WBAT    Mobility  Bed Mobility Overal bed mobility: Needs Assistance Bed Mobility: Supine to Sit;Sit to Supine     Supine to sit: Supervision Sit to supine: Supervision   General bed mobility comments: no assist needed, extra time required.  Transfers Overall transfer level: Needs assistance Equipment used: Rolling walker (2 wheeled) Transfers: Sit to/from Stand Sit to Stand: Supervision          General transfer comment: pt with good carryover for hand placement, cues required to kick Rt LE forward when sitting on commode and EOB.   Ambulation/Gait Ambulation/Gait assistance: Min assist Gait Distance (Feet): 130 Feet Assistive device: Rolling walker (2 wheeled) Gait Pattern/deviations: Step-to pattern;Decreased weight shift to right;Trunk flexed Gait velocity: decr   General Gait Details: cues for safe step pattern with RW and proximity. Pt husband present and educated on safe guarding position. pt requried intermittent cues for posture. No overt LOB noted.    Stairs Stairs: Yes Stairs assistance: Min assist;Min guard Stair Management: One rail Right;With cane;Step to pattern;Forwards Number of Stairs: 9 (3x3) General stair comments: cues for safe step sequencing "up with good, down with bad". repeated ceus for sequencing cane with LE's. pt required min assist 1x to steady. pt's husband provided guarding on third bout.    Wheelchair Mobility    Modified Rankin (Stroke Patients Only)       Balance Overall balance assessment: Needs assistance Sitting-balance support: Feet supported Sitting balance-Leahy Scale: Good     Standing balance support: During functional activity;Bilateral upper extremity supported Standing balance-Leahy Scale: Poor           Cognition Arousal/Alertness: Awake/alert Behavior During Therapy: WFL for tasks assessed/performed Overall Cognitive Status: Within Functional Limits for tasks assessed                General Comments        Pertinent Vitals/Pain Pain Assessment: Faces Faces Pain Scale: Hurts a little bit Pain Location: Rt knee Pain Descriptors / Indicators: Aching;Discomfort;Dull Pain Intervention(s): Limited activity within patient's tolerance;Monitored during session;Repositioned (pt c/o  numbness)    Home Living Family/patient expects to be discharged to:: Private residence Living Arrangements:  Spouse/significant other Available Help at Discharge: Family Type of Home: House Home Access: Stairs to enter Entrance Stairs-Rails: Left Home Layout: Two level Home Equipment: Environmental consultant - 2 wheels;Cane - single point;Bedside commode;Shower seat;Grab bars - tub/shower Additional Comments: pt has a split level house. pt will have assist from her husband.    Prior Function Level of Independence: Independent          PT Goals (current goals can now be found in the care plan section) Acute Rehab PT Goals Patient Stated Goal: get back to pool exercises PT Goal Formulation: With patient Time For Goal Achievement: 12/30/19 Potential to Achieve Goals: Good Progress towards PT goals: Progressing toward goals    Frequency    7X/week      PT Plan Current plan remains appropriate       AM-PAC PT "6 Clicks" Mobility   Outcome Measure  Help needed turning from your back to your side while in a flat bed without using bedrails?: None Help needed moving from lying on your back to sitting on the side of a flat bed without using bedrails?: A Little Help needed moving to and from a bed to a chair (including a wheelchair)?: A Little Help needed standing up from a chair using your arms (e.g., wheelchair or bedside chair)?: A Little Help needed to walk in hospital room?: A Lot Help needed climbing 3-5 steps with a railing? : A Lot 6 Click Score: 17    End of Session Equipment Utilized During Treatment: Gait belt Activity Tolerance: Patient tolerated treatment well Patient left: in bed;with call bell/phone within reach Nurse Communication: Mobility status PT Visit Diagnosis: Muscle weakness (generalized) (M62.81);Difficulty in walking, not elsewhere classified (R26.2)     Time: 4628-6381 PT Time Calculation (min) (ACUTE ONLY): 37 min  Charges:  $Gait Training: 23-37 mins                     Verner Mould, DPT Acute Rehabilitation Services  Office [phone] Pager  [phone]  12/23/2019 7:40 PM

## 2019-12-23 NOTE — Anesthesia Postprocedure Evaluation (Signed)
Anesthesia Post Note  Patient: [name]  Procedure(s) Performed: TOTAL KNEE ARTHROPLASTY (Right Knee)     Patient location during evaluation: PACU Anesthesia Type: Spinal Level of consciousness: awake and alert Pain management: pain level controlled Vital Signs Assessment: post-procedure vital signs reviewed and stable Respiratory status: spontaneous breathing and respiratory function stable Cardiovascular status: blood pressure returned to baseline and stable Postop Assessment: spinal receding and no apparent nausea or vomiting Anesthetic complications: no   No complications documented.  Last Vitals:  Vitals:   12/23/19 1430 12/23/19 1440  BP: (!) 149/73   Pulse: 63   Resp: (!) 22   Temp: (!) 36.4 C (!) 36.4 C  SpO2: 95%     Last Pain:  Vitals:   12/23/19 1430  TempSrc:   PainSc: 0-No pain                 Audry Pili

## 2019-12-23 NOTE — Progress Notes (Signed)
Orthopedic Tech Progress Note Patient Details:  [name] [birth year] [id number]  Ortho Devices Type of Ortho Device: Bone foam zero knee Ortho Device/Splint Interventions: Ordered, Application   Post Interventions Patient Tolerated: Well Instructions Provided: Care of device   Braulio Bosch 12/23/2019, 2:22 PM

## 2019-12-23 NOTE — Transfer of Care (Signed)
Immediate Anesthesia Transfer of Care Note  Patient: [name]  Procedure(s) Performed: Procedure(s): TOTAL KNEE ARTHROPLASTY (Right)  Patient Location: PACU  Anesthesia Type:Spinal  Level of Consciousness:  sedated, patient cooperative and responds to stimulation  Airway & Oxygen Therapy:Patient Spontanous Breathing and Patient connected to face mask oxgen  Post-op Assessment:  Report given to PACU RN and Post -op Vital signs reviewed and stable  Post vital signs:  Reviewed and stable  Last Vitals:  Vitals:   12/23/19 1149 12/23/19 1150  BP:  (!) 165/70  Pulse: 66 67  Resp: 19 12  Temp:    SpO2: 035% 248%    Complications: No apparent anesthesia complications

## 2019-12-23 NOTE — Anesthesia Procedure Notes (Addendum)
Spinal  Patient location during procedure: OR Start time: 12/23/2019 11:58 AM End time: 12/23/2019 12:02 PM Staffing Performed: anesthesiologist  Anesthesiologist: Audry Pili, MD Preanesthetic Checklist Completed: patient identified, IV checked, risks and benefits discussed, surgical consent, monitors and equipment checked, pre-op evaluation and timeout performed Spinal Block Patient position: sitting Prep: DuraPrep Patient monitoring: heart rate, cardiac monitor, continuous pulse ox and blood pressure Approach: midline Location: L3-4 Injection technique: single-shot Needle Needle type: Quincke  Needle gauge: 22 G Additional Notes Consent was obtained prior to the procedure with all questions answered and concerns addressed. Risks including, but not limited to, bleeding, infection, nerve damage, paralysis, failed block, inadequate analgesia, allergic reaction, high spinal, itching, and headache were discussed and the patient wished to proceed. Functioning IV was confirmed and monitors were applied. Sterile prep and drape, including hand hygiene, mask, and sterile gloves were used. The patient was positioned and the spine was prepped. The skin was anesthetized with lidocaine. Free flow of clear CSF was obtained prior to injecting local anesthetic into the CSF. The spinal needle aspirated freely following injection. The needle was carefully withdrawn. The patient tolerated the procedure well.   Renold Don, MD

## 2019-12-23 NOTE — Progress Notes (Signed)
PT recommend patient spend the night at the hospital with follow up PT in the am d/t numbness.  Carlyon Shadow, [place] called and informed of this information.  Orders placed for patient to be placed in observation.

## 2019-12-23 NOTE — Progress Notes (Signed)
Orthopedic Tech Progress Note Patient Details:  [name] [birth year] [id number]  Ortho Devices Type of Ortho Device: Knee Immobilizer Ortho Device/Splint Location: right Ortho Device/Splint Interventions: Ordered, Application   Post Interventions Patient Tolerated: Well Instructions Provided: Care of device   [name] 12/23/2019, 6:41 PM

## 2019-12-23 NOTE — H&P (Signed)
[name] MRN:  [id number] DOB/SEX:  Jan 18, 1933/female  CHIEF COMPLAINT:  Painful right Knee  HISTORY: Patient is a 86 y.o. female presented with a history of pain in the right knee. Onset of symptoms was gradual starting a few years ago with gradually worsening course since that time. Patient has been treated conservatively with over-the-counter NSAIDs and activity modification. Patient currently rates pain in the knee at 10 out of 10 with activity. There is pain at night.  PAST MEDICAL HISTORY: Patient Active Problem List   Diagnosis Date Noted  . Acalculous cholecystitis 08/24/2018  . Osteoarthritis of right knee   . Vertigo   . DDD (degenerative disc disease), cervical   . Benign essential HTN   . History of DVT (deep vein thrombosis)   . Tachycardia   . Steroid-induced hyperglycemia   . Hypothyroidism   . Neuropathic pain   . Acute blood loss anemia   . S/P lumbar fusion   . Closed L5 vertebral fracture ([place]) 08/31/2017  . Closed fracture of fifth lumbar vertebra ([place]) 08/30/2017  . Lumbar vertebral fracture ([place]) 07/17/2017  . Scoliosis 06/13/2017   Past Medical History:  Diagnosis Date  . Acalculous cholecystitis 08/24/2018  . Arthritis   . DVT of lower extremity (deep venous thrombosis) ([place])    right leg - after back surgery in 2019  . GERD (gastroesophageal reflux disease)   . History of blood transfusion   . Hypothyroidism   . PONV (postoperative nausea and vomiting)   . Ptosis of both eyelids   . Vertigo   . Wears glasses    reading   Past Surgical History:  Procedure Laterality Date  . ABDOMINAL HYSTERECTOMY  1986  . APPLICATION OF ROBOTIC ASSISTANCE FOR SPINAL PROCEDURE N/A 07/17/2017   Procedure: APPLICATION OF ROBOTIC ASSISTANCE FOR SPINAL PROCEDURE;  Surgeon: Kristeen Miss, MD;  Location: [facility];  Service: Neurosurgery;  Laterality: N/A;  . APPLICATION OF ROBOTIC ASSISTANCE FOR SPINAL PROCEDURE N/A 09/01/2017   Procedure: APPLICATION OF  ROBOTIC ASSISTANCE FOR SPINAL PROCEDURE;  Surgeon: Kristeen Miss, MD;  Location: [facility];  Service: Neurosurgery;  Laterality: N/A;  . [place]  2000   right  . CARPAL TUNNEL RELEASE  2012   left  . CHOLECYSTECTOMY N/A 08/24/2018   Procedure: LAPAROSCOPIC CHOLECYSTECTOMY WITH INTRAOPERATIVE CHOLANGIOGRAM;  Surgeon: Fanny Skates, MD;  Location: [facility];  Service: General;  Laterality: N/A;  . COLONOSCOPY    . CYSTOCELE REPAIR  2007   sling  . DISTAL INTERPHALANGEAL JOINT FUSION Right 02/04/2014   Procedure: FUSION DISTAL INTERPHALANGEAL JOINT RIGHT INDEX FINGER ;  Surgeon: Daryll Brod, MD;  Location: [facility];  Service: Orthopedics;  Laterality: Right;  . EYE SURGERY Bilateral    Cataract surgery with lens implant  . KNEE SURGERY  2009,2011   partial knee  . LAPAROSCOPY N/A 08/24/2018   Procedure: LAPAROSCOPY DIAGNOSTIC;  Surgeon: Fanny Skates, MD;  Location: [facility];  Service: General;  Laterality: N/A;  . OPEN REDUCTION INTERNAL FIXATION (ORIF) PROXIMAL PHALANX Left 08/30/2013   Procedure: OPEN REDUCTION INTERNAL FIXATION (ORIF) PROXIMAL PHALANX FRACTURE LEFT SMALL FINGER; SPLINT RING FINGER;  Surgeon: Wynonia Sours, MD;  Location: [facility];  Service: Orthopedics;  Laterality: Left;  . POSTERIOR LUMBAR FUSION 4 LEVEL N/A 07/17/2017   Procedure: Thoracic Ten - Lumbar Five revison of hardware with Mazor;  Surgeon: Kristeen Miss, MD;  Location: [facility];  Service: Neurosurgery;  Laterality: N/A;  Thoracic/Lumbar  . PTOSIS REPAIR Bilateral 07/27/2015   Procedure: PTOSIS REPAIR;  Surgeon: Cristine Polio, MD;  Location: [facility];  Service: Plastics;  Laterality: Bilateral;  . SHOULDER SURGERY     rt rcr,and lt  . TONSILLECTOMY       MEDICATIONS:   Medications Prior to Admission  Medication Sig Dispense Refill Last Dose  . BLACK PEPPER-TURMERIC PO Take 1 tablet by mouth at bedtime.   Past Week at  Unknown time  . diphenhydramine-acetaminophen (TYLENOL PM) 25-500 MG TABS tablet Take 2 tablets by mouth at bedtime.    Past Week at Unknown time  . levothyroxine (SYNTHROID, LEVOTHROID) 50 MCG tablet Take 50 mcg by mouth daily before breakfast.    12/23/2019 at Unknown time  . Multiple Vitamin (MULTIVITAMIN WITH MINERALS) TABS tablet Take 1 tablet by mouth at bedtime.   Past Week at Unknown time  . bumetanide (BUMEX) 0.5 MG tablet Take 0.5 mg by mouth daily as needed (swelling). (Patient not taking: Reported on 12/12/2019)   Not Taking at Unknown time  . gabapentin (NEURONTIN) 100 MG capsule Take 1 capsule (100 mg total) by mouth daily. (Patient not taking: Reported on 08/22/2018) 30 capsule 2   . meclizine (ANTIVERT) 25 MG tablet Take 25 mg by mouth 3 (three) times daily as needed for dizziness. (Patient not taking: Reported on 12/12/2019)   Not Taking at Unknown time  . methocarbamol (ROBAXIN) 500 MG tablet Take 1 tablet (500 mg total) by mouth every 6 (six) hours as needed for muscle spasms. (Patient not taking: Reported on 08/25/2017) 40 tablet 5   . methocarbamol (ROBAXIN) 500 MG tablet Take 1 tablet (500 mg total) by mouth every 8 (eight) hours as needed for muscle spasms. (Patient not taking: Reported on 08/22/2018) 40 tablet 3   . naproxen sodium (ALEVE) 220 MG tablet Take 440 mg by mouth in the morning.   More than a month at Unknown time  . omeprazole (PRILOSEC) 20 MG capsule Take 20 mg by mouth daily. (Patient not taking: Reported on 12/12/2019)   Not Taking at Unknown time  . ondansetron (ZOFRAN) 4 MG tablet Take 1 tablet (4 mg total) by mouth every 6 (six) hours. (Patient not taking: Reported on 12/12/2019) 12 tablet 0 Not Taking at Unknown time  . OVER THE COUNTER MEDICATION Take 2 capsules by mouth at bedtime. Memory and Nerve Capsules   More than a month at Unknown time  . oxyCODONE (OXY IR/ROXICODONE) 5 MG immediate release tablet Take 1 tablet (5 mg total) by mouth every 6 (six) hours as needed  for severe pain. (Patient not taking: Reported on 12/12/2019) 20 tablet 0 Not Taking at Unknown time  . Sodium Hyaluronate, oral, (HYALURONIC ACID PO) Take 2 tablets by mouth in the morning.   More than a month at Unknown time  . Vitamins-Lipotropics (B-50 PO) Take 1 tablet by mouth at bedtime.   More than a month at Unknown time    ALLERGIES:   Allergies  Allergen Reactions  . Keflex [Cephalexin] Hives    REVIEW OF SYSTEMS:  A comprehensive review of systems was negative except for: Musculoskeletal: positive for arthralgias and bone pain   FAMILY HISTORY:   Family History  Problem Relation Age of Onset  . Breast cancer Neg Hx     SOCIAL HISTORY:   Social History   Tobacco Use  . Smoking status: Former Research scientist (life sciences)  . Smokeless tobacco: Never Used  . Tobacco comment: 07/14/17-  quit for 62  Substance Use Topics  . Alcohol use: No     EXAMINATION:  Vital signs in last 24 hours: Temp:  [98.4 F (36.9 C)] 98.4 F (36.9 C) (09/20 0928) Pulse Rate:  [95] 95 (09/20 0928) Resp:  [20] 20 (09/20 0928) BP: (143)/(102) 143/102 (09/20 0928) SpO2:  [97 %] 97 % (09/20 0928) Weight:  [79.4 kg] 79.4 kg (09/20 0946)  BP (!) 143/102 Comment: RN notified  Pulse 95   Temp 98.4 F (36.9 C) (Oral)   Resp 20   Ht 5\' 6"  (1.676 m)   Wt 79.4 kg   SpO2 97%   BMI 28.25 kg/m   General Appearance:    Alert, cooperative, no distress, appears stated age  Head:    Normocephalic, without obvious abnormality, atraumatic  Eyes:    PERRL, conjunctiva/corneas clear, EOM's intact, fundi    benign, both eyes  Ears:    Normal TM's and external ear canals, both ears  Nose:   Nares normal, septum midline, mucosa normal, no drainage    or sinus tenderness  Throat:   Lips, mucosa, and tongue normal; teeth and gums normal  Neck:   Supple, symmetrical, trachea midline, no adenopathy;    thyroid:  no enlargement/tenderness/nodules; no carotid   bruit or JVD  Back:     Symmetric, no curvature, ROM normal, no  CVA tenderness  Lungs:     Clear to auscultation bilaterally, respirations unlabored  Chest Wall:    No tenderness or deformity   Heart:    Regular rate and rhythm, S1 and S2 normal, no murmur, rub   or gallop  Breast Exam:    No tenderness, masses, or nipple abnormality  Abdomen:     Soft, non-tender, bowel sounds active all four quadrants,    no masses, no organomegaly  Genitalia:    Normal female without lesion, discharge or tenderness  Rectal:    Normal tone, normal prostate, no masses or tenderness;   guaiac negative stool  Extremities:   Extremities normal, atraumatic, no cyanosis or edema  Pulses:   2+ and symmetric all extremities  Skin:   Skin color, texture, turgor normal, no rashes or lesions  Lymph nodes:   Cervical, supraclavicular, and axillary nodes normal  Neurologic:   CNII-XII intact, normal strength, sensation and reflexes    throughout    Musculoskeletal:  ROM 0-120, Ligaments intact,  Imaging Review Plain radiographs demonstrate severe degenerative joint disease of the right knee. The overall alignment is neutral. The bone quality appears to be good for age and reported activity level.  Assessment/Plan: Primary osteoarthritis, right knee   The patient history, physical examination and imaging studies are consistent with advanced degenerative joint disease of the right knee. The patient has failed conservative treatment.  The clearance notes were reviewed.  After discussion with the patient it was felt that Total Knee Replacement was indicated. The procedure,  risks, and benefits of total knee arthroplasty were presented and reviewed. The risks including but not limited to aseptic loosening, infection, blood clots, vascular injury, stiffness, patella tracking problems complications among others were discussed. The patient acknowledged the explanation, agreed to proceed with the plan.  Preoperative templating of the joint replacement has been completed, documented, and  submitted to the Operating Room personnel in order to optimize intra-operative equipment management.    Patient's anticipated LOS is less than 2 midnights, meeting these requirements: - Lives within 1 hour of care - Has a competent adult at home to  recover with post-op recover - NO history of  - Chronic pain requiring opiods  - Diabetes  - Coronary Artery Disease  - Heart failure  - Heart attack  - Stroke  - DVT/VTE  - Cardiac arrhythmia  - Respiratory Failure/COPD  - Renal failure  - Anemia  - Advanced Liver disease       Donia Ast 12/23/2019, 10:13 AM

## 2019-12-23 NOTE — Progress Notes (Signed)
Orthopedic Tech Progress Note Patient Details:  [name] 08/06/1932 [id number]  CPM Right Knee CPM Right Knee: On Right Knee Flexion (Degrees): 90 Right Knee Extension (Degrees): 0 Additional Comments: foot roll  Post Interventions Patient Tolerated: Well Instructions Provided: Care of device  [name] 12/23/2019, 2:22 PM

## 2019-12-23 NOTE — Evaluation (Signed)
Physical Therapy Evaluation Patient Details Name: [name] MRN: [id number] DOB: 11-Aug-1932 Today's Date: 12/23/2019   History of Present Illness  Patient is 86 y.o. female s/p Rt TKA on 12/23/19 with PMH significant for vertigo, GERD, DVT, OA, Ptosis, T10-L5 fusion.    Clinical Impression  [name] is a 86 y.o. female POD 0 s/p Rt TKA. Patient reports independence with mobility at baseline. Patient is now limited by functional impairments (see PT problem list below) and requires min guard for bed mobility and transfers and min assist for gait with RW. Patient was able to take several small steps with min assist and RW, pt limited this session by numbness in Rt LE. Patient instructed in exercise to facilitate ROM and circulation to manage edema. Patient will benefit from continued skilled PT interventions to address impairments and progress towards PLOF. Acute PT will follow for additional session to progress mobility and stair training in preparation for safe discharge home.     Follow Up Recommendations Follow surgeon's recommendation for DC plan and follow-up therapies;Home health PT    Equipment Recommendations  None recommended by PT    Recommendations for Other Services       Precautions / Restrictions Precautions Precautions: Fall Restrictions Weight Bearing Restrictions: No Other Position/Activity Restrictions: WBAT      Mobility  Bed Mobility Overal bed mobility: Needs Assistance Bed Mobility: Supine to Sit;Sit to Supine     Supine to sit: Supervision Sit to supine: Supervision   General bed mobility comments: pt taking some extra time, no assist required for Rt LE mobility.  Transfers Overall transfer level: Needs assistance Equipment used: Rolling walker (2 wheeled) Transfers: Sit to/from Stand Sit to Stand: Min guard         General transfer comment: cues for safe hand placement/technique with RW, min guard for safety, pt  mildly unsteady with standing.  Ambulation/Gait Ambulation/Gait assistance: Min assist Gait Distance (Feet): 3 Feet Assistive device: Rolling walker (2 wheeled) Gait Pattern/deviations: Step-to pattern Gait velocity: decr   General Gait Details: pt took several short steps forward and backward at EOB. pt c.o of numbness in Rt LE and pt with some hyperextension at Rt knee in stance. Pt relying heavily on UE's to reduce weigth bearing on Rt to prevent buckling.   Stairs            Wheelchair Mobility    Modified Rankin (Stroke Patients Only)       Balance Overall balance assessment: Needs assistance Sitting-balance support: Feet supported Sitting balance-Leahy Scale: Good     Standing balance support: During functional activity;Bilateral upper extremity supported Standing balance-Leahy Scale: Poor              Pertinent Vitals/Pain Pain Assessment: Faces Faces Pain Scale: Hurts a little bit Pain Location: Rt knee Pain Descriptors / Indicators: Aching;Discomfort;Dull Pain Intervention(s): Limited activity within patient's tolerance;Monitored during session;Repositioned    Home Living Family/patient expects to be discharged to:: Private residence Living Arrangements: Spouse/significant other Available Help at Discharge: Family Type of Home: House Home Access: Stairs to enter Entrance Stairs-Rails: Left Entrance Stairs-Number of Steps: 1 Home Layout: Two level Home Equipment: Environmental consultant - 2 wheels;Cane - single point;Bedside commode;Shower seat;Grab bars - tub/shower Additional Comments: pt has a split level house. pt will have assist from her husband.    Prior Function Level of Independence: Independent               Hand Dominance   Dominant Hand: Right  Extremity/Trunk Assessment   Upper Extremity Assessment Upper Extremity Assessment: Overall WFL for tasks assessed    Lower Extremity Assessment Lower Extremity Assessment: RLE  deficits/detail RLE Deficits / Details: good quad activation, mild extensor lag with SLR. RLE Sensation: WNL RLE Coordination: WNL    Cervical / Trunk Assessment Cervical / Trunk Assessment: Normal  Communication   Communication: No difficulties  Cognition Arousal/Alertness: Awake/alert Behavior During Therapy: WFL for tasks assessed/performed Overall Cognitive Status: Within Functional Limits for tasks assessed           General Comments      Exercises Total Joint Exercises Ankle Circles/Pumps: AROM;Both;20 reps;Supine Quad Sets: AROM;Right;5 reps;Supine Short Arc Quad: AROM;Right;5 reps;Supine Heel Slides: AROM;Right;5 reps;Supine Hip ABduction/ADduction: AROM;Right;5 reps;Supine Straight Leg Raises: AROM;Right;5 reps;Supine Long Arc Quad: AROM;Right;5 reps;Seated Knee Flexion: Right;AROM;AAROM;Other reps (comment);Seated (3)   Assessment/Plan    PT Assessment Patient needs continued PT services  PT Problem List Decreased strength;Decreased range of motion;Decreased activity tolerance;Decreased balance;Decreased mobility;Decreased knowledge of use of DME;Decreased safety awareness;Decreased knowledge of precautions       PT Treatment Interventions DME instruction;Gait training;Stair training;Functional mobility training;Therapeutic activities;Therapeutic exercise;Balance training;Patient/family education    PT Goals (Current goals can be found in the Care Plan section)  Acute Rehab PT Goals Patient Stated Goal: get back to pool exercises PT Goal Formulation: With patient Time For Goal Achievement: 12/30/19 Potential to Achieve Goals: Good    Frequency 7X/week   Barriers to discharge           AM-PAC PT "6 Clicks" Mobility  Outcome Measure Help needed turning from your back to your side while in a flat bed without using bedrails?: None Help needed moving from lying on your back to sitting on the side of a flat bed without using bedrails?: A Little Help  needed moving to and from a bed to a chair (including a wheelchair)?: A Little Help needed standing up from a chair using your arms (e.g., wheelchair or bedside chair)?: A Little Help needed to walk in hospital room?: A Lot Help needed climbing 3-5 steps with a railing? : A Lot 6 Click Score: 17    End of Session Equipment Utilized During Treatment: Gait belt Activity Tolerance: Patient tolerated treatment well Patient left: in bed;with call bell/phone within reach Nurse Communication: Mobility status PT Visit Diagnosis: Muscle weakness (generalized) (M62.81);Difficulty in walking, not elsewhere classified (R26.2)    Time: 4315-4008 PT Time Calculation (min) (ACUTE ONLY): 33 min   Charges:   PT Evaluation $PT Eval Low Complexity: 1 Low PT Treatments $Therapeutic Exercise: 8-22 mins       Verner Mould, DPT Acute Rehabilitation Services  Office [phone] Pager [phone]  12/23/2019 6:41 PM

## 2019-12-23 NOTE — Progress Notes (Signed)
Assisted Dr. Brock with right, ultrasound guided, adductor canal block. Side rails up, monitors on throughout procedure. See vital signs in flow sheet. Tolerated Procedure well.  

## 2019-12-23 NOTE — Anesthesia Procedure Notes (Signed)
Anesthesia Regional Block: Adductor canal block   Pre-Anesthetic Checklist: ,, timeout performed, Correct Patient, Correct Site, Correct Laterality, Correct Procedure, Correct Position, site marked, Risks and benefits discussed,  Surgical consent,  Pre-op evaluation,  At surgeon's request and post-op pain management  Laterality: Right  Prep: chloraprep       Needles:  Injection technique: Single-shot  Needle Type: Echogenic Needle     Needle Length: 10cm  Needle Gauge: 21     Additional Needles:   Narrative:  Start time: 12/23/2019 11:46 AM End time: 12/23/2019 11:49 AM Injection made incrementally with aspirations every 5 mL.  Performed by: Personally  Anesthesiologist: Audry Pili, MD  Additional Notes: No pain on injection. No increased resistance to injection. Injection made in 5cc increments. Good needle visualization. Patient tolerated the procedure well.

## 2019-12-23 NOTE — Anesthesia Preprocedure Evaluation (Addendum)
Anesthesia Evaluation  Patient identified by MRN, date of birth, ID band Patient awake    Reviewed: Allergy & Precautions, NPO status , Patient's Chart, lab work & pertinent test results  History of Anesthesia Complications (+) PONV and history of anesthetic complications  Airway Mallampati: II  TM Distance: >3 FB Neck ROM: Full    Dental  (+) Dental Advisory Given, Partial Lower, Partial Upper   Pulmonary former smoker,    Pulmonary exam normal        Cardiovascular hypertension, + DVT  Normal cardiovascular exam     Neuro/Psych  Vertigo  negative psych ROS   GI/Hepatic Neg liver ROS, GERD  Controlled,  Endo/Other  Hypothyroidism   Renal/GU negative Renal ROS     Musculoskeletal  (+) Arthritis ,  Scoliosis Hx L5 fracture    Abdominal   Peds  Hematology negative hematology ROS (+)  Plt 323k    Anesthesia Other Findings Covid test negative   Reproductive/Obstetrics                            Anesthesia Physical Anesthesia Plan  ASA: II  Anesthesia Plan: Spinal   Post-op Pain Management:  Regional for Post-op pain   Induction:   PONV Risk Score and Plan: 3 and Treatment may vary due to age or medical condition and Propofol infusion  Airway Management Planned: Natural Airway and Simple Face Mask  Additional Equipment: None  Intra-op Plan:   Post-operative Plan:   Informed Consent: I have reviewed the patients History and Physical, chart, labs and discussed the procedure including the risks, benefits and alternatives for the proposed anesthesia with the patient or authorized representative who has indicated his/her understanding and acceptance.       Plan Discussed with: CRNA and Anesthesiologist  Anesthesia Plan Comments: (Labs reviewed, platelets acceptable. Discussed risks and benefits of spinal, including spinal/epidural hematoma, infection, failed block, and  PDPH. Patient expressed understanding and wished to proceed. )       Anesthesia Quick Evaluation

## 2019-12-24 ENCOUNTER — Encounter (HOSPITAL_COMMUNITY): Payer: Self-pay | Admitting: Orthopedic Surgery

## 2019-12-24 DIAGNOSIS — M1711 Unilateral primary osteoarthritis, right knee: Secondary | ICD-10-CM | POA: Diagnosis not present

## 2019-12-24 LAB — BASIC METABOLIC PANEL
Anion gap: 8 (ref 5–15)
BUN: 13 mg/dL (ref 8–23)
CO2: 27 mmol/L (ref 22–32)
Calcium: 9 mg/dL (ref 8.9–10.3)
Chloride: 99 mmol/L (ref 98–111)
Creatinine, Ser: 0.62 mg/dL (ref 0.44–1.00)
GFR calc Af Amer: >60 mL/min (ref >60)
GFR calc non Af Amer: >60 mL/min (ref >60)
Glucose, Bld: 124 mg/dL — ABNORMAL HIGH (ref 70–99)
Potassium: 4.5 mmol/L (ref 3.5–5.1)
Sodium: 134 mmol/L — ABNORMAL LOW (ref 135–145)

## 2019-12-24 LAB — CBC
HCT: 31.6 % — ABNORMAL LOW (ref 36.0–46.0)
Hemoglobin: 10.3 g/dL — ABNORMAL LOW (ref 12.0–15.0)
MCH: 31.4 pg (ref 26.0–34.0)
MCHC: 32.6 g/dL (ref 30.0–36.0)
MCV: 96.3 fL (ref 80.0–100.0)
Platelets: 281 10*3/uL (ref 150–400)
RBC: 3.28 MIL/uL — ABNORMAL LOW (ref 3.87–5.11)
RDW: 13.4 % (ref 11.5–15.5)
WBC: 10 10*3/uL (ref 4.0–10.5)
nRBC: 0 % (ref 0.0–0.2)

## 2019-12-24 NOTE — Progress Notes (Signed)
Patient being d/c in stable condition and is excited about going home. Patient discharge teaching and instructions given and reviewed by patient husband as well. Pt c/o minimal pain at this time. Notified Carlyon Shadow, [place] that patient would like to have tramadol for home, prescription sent from doctors office.

## 2019-12-24 NOTE — Progress Notes (Signed)
Physical Therapy Treatment Patient Details Name: [name] MRN: [id number] DOB: April 20, 1932 Today's Date: 12/24/2019    History of Present Illness Patient is 86 y.o. female s/p Rt TKA on 12/23/19 with PMH significant for vertigo, GERD, DVT, OA, Ptosis, T10-L5 fusion.    PT Comments    Patient with good carryover overall for bed mobility, transfers, and gait with RW. Pt's husband present and provided safe guarding during gait and stair training. Reviewed stairs with hand rail and reverse step up for stool at EOB; min guard/superviison and no overt LOB noted. Reviewed HEP for pt and precautions to keep knee straight/extended when resting. Pt has met mobility at safe level for discharge home and is safe to return home with assist from husband when medically ready. Acute PT will continue to progress as able in acute setting.   Follow Up Recommendations  Follow surgeon's recommendation for DC plan and follow-up therapies;Home health PT     Equipment Recommendations  None recommended by PT    Recommendations for Other Services       Precautions / Restrictions Precautions Precautions: Fall Restrictions Weight Bearing Restrictions: No Other Position/Activity Restrictions: WBAT    Mobility  Bed Mobility Overal bed mobility: Needs Assistance Bed Mobility: Supine to Sit;Sit to Supine     Supine to sit: Supervision Sit to supine: Supervision   General bed mobility comments: no assist needed, extra time required.  Transfers Overall transfer level: Needs assistance Equipment used: Rolling walker (2 wheeled) Transfers: Sit to/from Stand Sit to Stand: Supervision         General transfer comment: pt with good carryover for hand placement, cues required to kick Rt LE forward when sitting and to reach back to recliner and EOB.  Ambulation/Gait Ambulation/Gait assistance: Min assist Gait Distance (Feet): 180 Feet Assistive device: Rolling walker (2 wheeled) Gait  Pattern/deviations: Step-to pattern;Decreased weight shift to right;Trunk flexed Gait velocity: decr   General Gait Details: pt with good carryover for step pattern, occasional cues for safe proximity to RW. Pt's husband provided safe guarding throughout. no overt LOB noted.   Stairs Stairs: Yes Stairs assistance: Min guard;Supervision Stair Management: One rail Right;With cane;Step to pattern;Forwards;With walker;Backwards;No rails Number of Stairs: 7 (2x3, 1x1) General stair comments: pt required review of sequencing cane and step pattern "up with good down with bad" performed 2x with min guard from husband to ascend/descend 3 steps. Reviewed reverse step up to negotiate stool at EOB as pt has high bed.    Wheelchair Mobility    Modified Rankin (Stroke Patients Only)       Balance Overall balance assessment: Needs assistance Sitting-balance support: Feet supported Sitting balance-Leahy Scale: Good     Standing balance support: During functional activity;Bilateral upper extremity supported Standing balance-Leahy Scale: Fair           Cognition Arousal/Alertness: Awake/alert Behavior During Therapy: WFL for tasks assessed/performed Overall Cognitive Status: Within Functional Limits for tasks assessed           Exercises Total Joint Exercises Ankle Circles/Pumps: AROM;Both;20 reps;Supine Quad Sets: AROM;Right;Seated;10 reps Short Arc Quad: AROM;Right;Seated;10 reps Heel Slides: AROM;Right;Seated;10 reps Hip ABduction/ADduction: AROM;Right;Seated;10 reps Straight Leg Raises: AROM;Right;10 reps;Seated Knee Flexion: AROM;AAROM;Right;10 reps;Seated    General Comments        Pertinent Vitals/Pain Pain Assessment: Faces Faces Pain Scale: Hurts little more Pain Location: Rt knee Pain Descriptors / Indicators: Aching;Discomfort;Dull Pain Intervention(s): Limited activity within patient's tolerance;Monitored during session;Repositioned;Ice applied  PT  Goals (current goals can now be found in the care plan section) Acute Rehab PT Goals Patient Stated Goal: get back to pool exercises PT Goal Formulation: With patient Time For Goal Achievement: 12/30/19 Potential to Achieve Goals: Good Progress towards PT goals: Progressing toward goals    Frequency    7X/week      PT Plan Current plan remains appropriate       AM-PAC PT "6 Clicks" Mobility   Outcome Measure  Help needed turning from your back to your side while in a flat bed without using bedrails?: None Help needed moving from lying on your back to sitting on the side of a flat bed without using bedrails?: None Help needed moving to and from a bed to a chair (including a wheelchair)?: A Little Help needed standing up from a chair using your arms (e.g., wheelchair or bedside chair)?: A Little Help needed to walk in hospital room?: A Little Help needed climbing 3-5 steps with a railing? : A Little 6 Click Score: 20    End of Session Equipment Utilized During Treatment: Gait belt Activity Tolerance: Patient tolerated treatment well Patient left: in bed;with call bell/phone within reach Nurse Communication: Mobility status PT Visit Diagnosis: Muscle weakness (generalized) (M62.81);Difficulty in walking, not elsewhere classified (R26.2)     Time: 2233-6122 PT Time Calculation (min) (ACUTE ONLY): 51 min  Charges:  $Gait Training: 23-37 mins $Therapeutic Exercise: 8-22 mins                     Verner Mould, DPT Acute Rehabilitation Services  Office [phone] Pager [phone]  12/24/2019 10:15 AM

## 2019-12-24 NOTE — Discharge Summary (Signed)
SPORTS MEDICINE & JOINT REPLACEMENT   Lara Mulch, MD   Carlyon Shadow, PA-C [address], [place], [place]  [address]                             [phone]  PATIENT ID: [name]        MRN:  [id number]          DOB/AGE: 84-Sep-1934 / 84 y.o.    DISCHARGE SUMMARY  ADMISSION DATE:    12/23/2019 DISCHARGE DATE:   12/24/2019   ADMISSION DIAGNOSIS: S/P total knee arthroplasty, right [Z96.651]    DISCHARGE DIAGNOSIS:  Osteoarthritis of right knee M17.11    ADDITIONAL DIAGNOSIS: Active Problems:   S/P total knee arthroplasty, right  Past Medical History:  Diagnosis Date  . Acalculous cholecystitis 08/24/2018  . Arthritis   . DVT of lower extremity (deep venous thrombosis) ([place])    right leg - after back surgery in 2019  . GERD (gastroesophageal reflux disease)   . History of blood transfusion   . Hypothyroidism   . PONV (postoperative nausea and vomiting)   . Ptosis of both eyelids   . Vertigo   . Wears glasses    reading    PROCEDURE: Procedure(s): TOTAL KNEE ARTHROPLASTY on 12/23/2019  CONSULTS:    HISTORY:  See H&P in chart  HOSPITAL COURSE:  [name] is a 86 y.o. admitted on 12/23/2019 and found to have a diagnosis of Osteoarthritis of right knee M17.11.  After appropriate laboratory studies were obtained  they were taken to the operating room on 12/23/2019 and underwent Procedure(s): TOTAL KNEE ARTHROPLASTY.   They were given perioperative antibiotics:  Anti-infectives (From admission, onward)   Start     Dose/Rate Route Frequency Ordered Stop   12/23/19 1858  ceFAZolin (ANCEF) 2-4 GM/100ML-% IVPB       Note to Pharmacy: Lanell Persons  : cabinet override      12/23/19 1858 12/24/19 0659   12/23/19 1800  ceFAZolin (ANCEF) IVPB 2g/100 mL premix        2 g 200 mL/hr over 30 Minutes Intravenous Every 6 hours 12/23/19 1351 12/24/19 0002   12/23/19 0915  ceFAZolin (ANCEF) IVPB 2g/100 mL premix        2 g 200 mL/hr over 30  Minutes Intravenous On call to O.R. 12/23/19 2229 12/23/19 1211    .  Patient given tranexamic acid IV or topical and exparel intra-operatively.  Tolerated the procedure well.    POD# 1: Vital signs were stable.  Patient denied Chest pain, shortness of breath, or calf pain.  Patient was started on Aspirin twice daily at 8am.  Consults to PT, OT, and care management were made.  The patient was weight bearing as tolerated.  CPM was placed on the operative leg 0-90 degrees for 6-8 hours a day. When out of the CPM, patient was placed in the foam block to achieve full extension. Incentive spirometry was taught.  Dressing was changed.       POD #2, Continued  PT for ambulation and exercise program.  IV saline locked.  O2 discontinued.    The remainder of the hospital course was dedicated to ambulation and strengthening.   The patient was discharged on 1 Day Post-Op in  Good condition.  Blood products given:none  DIAGNOSTIC STUDIES: Recent vital signs:  Patient Vitals for the past 24 hrs:  BP Temp Temp src Pulse Resp SpO2 Height Weight  12/24/19 0706 139/74 97.6 F (36.4 C) Oral 65 18 98 % -- --  12/24/19 0154 120/69 (!) 97.5 F (36.4 C) Oral 65 18 97 % -- --  12/23/19 2320 137/69 98 F (36.7 C) Oral 74 18 96 % -- --  12/23/19 2235 125/84 98.2 F (36.8 C) Oral -- 18 97 % -- --  12/23/19 2134 126/85 98.2 F (36.8 C) Oral 87 18 97 % -- --  12/23/19 2039 134/84 98 F (36.7 C) Oral 96 18 95 % -- --  12/23/19 2024 131/87 98.3 F (36.8 C) -- 91 16 98 % -- --  12/23/19 2000 105/79 -- -- 93 -- 96 % -- --  12/23/19 1945 (!) 144/83 -- -- 95 16 98 % -- --  12/23/19 1837 (!) 147/96 -- -- 66 16 98 % -- --  12/23/19 1834 -- -- -- 70 16 97 % -- --  12/23/19 1745 (!) 142/94 -- -- 64 16 96 % -- --  12/23/19 1700 (!) 136/92 98.1 F (36.7 C) -- 66 16 97 % -- --  12/23/19 1600 (!) 143/79 -- -- 66 16 98 % -- --  12/23/19 1514 (!) 154/84 -- -- 64 -- 98 % -- --  12/23/19 1440 (!) 122/103 (!) 97.5 F  (36.4 C) -- 61 18 96 % -- --  12/23/19 1430 (!) 149/73 (!) 97.5 F (36.4 C) -- 63 (!) 22 95 % -- --  12/23/19 1415 (!) 146/79 -- -- 66 19 95 % -- --  12/23/19 1400 (!) 135/91 -- -- 62 17 97 % -- --  12/23/19 1345 (!) 141/67 -- -- 65 16 100 % -- --  12/23/19 1341 133/69 (!) 97.5 F (36.4 C) -- 65 16 100 % -- --  12/23/19 1150 (!) 165/70 -- -- 67 12 100 % -- --  12/23/19 1149 -- -- -- 66 19 100 % -- --  12/23/19 1148 -- -- -- 66 14 100 % -- --  12/23/19 1147 -- -- -- 69 (!) 21 100 % -- --  12/23/19 1146 -- -- -- 70 14 100 % -- --  12/23/19 1145 (!) 149/75 -- -- 70 (!) 23 100 % -- --  12/23/19 1144 -- -- -- -- (!) 26 -- -- --  12/23/19 1143 -- -- -- -- (!) 22 -- -- --  12/23/19 1142 -- -- -- -- (!) 21 -- -- --  12/23/19 1141 -- -- -- -- 20 -- -- --  12/23/19 1140 140/74 -- -- -- 19 -- -- --  12/23/19 1139 -- -- -- -- 18 -- -- --  12/23/19 1138 -- -- -- -- (!) 22 -- -- --  12/23/19 1137 (!) 144/80 -- -- -- (!) 26 -- -- --  12/23/19 1136 -- -- -- -- 20 -- -- --  12/23/19 1135 -- -- -- -- 20 -- -- --  12/23/19 1134 -- -- -- 69 20 100 % -- --  12/23/19 1133 -- -- -- -- 20 -- -- --  12/23/19 1132 -- -- -- -- (!) 21 -- -- --  12/23/19 1131 -- -- -- -- (!) 24 -- -- --  12/23/19 1130 131/76 -- -- 69 14 96 % -- --  12/23/19 0946 -- -- -- -- -- -- 5\' 6"  (1.676 m) 79.4 kg  12/23/19 0928 (!) 143/102 98.4 F (36.9 C) Oral 95 20 97 % -- --       Recent laboratory studies: Recent Labs    12/18/19 1435 12/24/19  0312  WBC 7.2 10.0  HGB 12.7 10.3*  HCT 38.8 31.6*  PLT 323 281   Recent Labs    12/18/19 1435 12/24/19 0312  NA 135 134*  K 4.3 4.5  CL 100 99  CO2 28 27  BUN 17 13  CREATININE 0.73 0.62  GLUCOSE 97 124*  CALCIUM 9.7 9.0   No results found for: INR, PROTIME   Recent Radiographic Studies :  No results found.  DISCHARGE INSTRUCTIONS: Discharge Instructions    Call MD / Call 911   Complete by: As directed    If you experience chest pain or shortness of breath, CALL  911 and be transported to the hospital emergency room.  If you develope a fever above 101 F, pus (white drainage) or increased drainage or redness at the wound, or calf pain, call your surgeon's office.   Call MD / Call 911   Complete by: As directed    If you experience chest pain or shortness of breath, CALL 911 and be transported to the hospital emergency room.  If you develope a fever above 101 F, pus (white drainage) or increased drainage or redness at the wound, or calf pain, call your surgeon's office.   Constipation Prevention   Complete by: As directed    Drink plenty of fluids.  Prune juice may be helpful.  You may use a stool softener, such as Colace (over the counter) 100 mg twice a day.  Use MiraLax (over the counter) for constipation as needed.   Constipation Prevention   Complete by: As directed    Drink plenty of fluids.  Prune juice may be helpful.  You may use a stool softener, such as Colace (over the counter) 100 mg twice a day.  Use MiraLax (over the counter) for constipation as needed.   Diet - low sodium heart healthy   Complete by: As directed    Diet - low sodium heart healthy   Complete by: As directed    Discharge instructions   Complete by: As directed    INSTRUCTIONS AFTER JOINT REPLACEMENT   Remove items at home which could result in a fall. This includes throw rugs or furniture in walking pathways ICE to the affected joint every three hours while awake for 30 minutes at a time, for at least the first 3-5 days, and then as needed for pain and swelling.  Continue to use ice for pain and swelling. You may notice swelling that will progress down to the foot and ankle.  This is normal after surgery.  Elevate your leg when you are not up walking on it.   Continue to use the breathing machine you got in the hospital (incentive spirometer) which will help keep your temperature down.  It is common for your temperature to cycle up and down following surgery, especially at  night when you are not up moving around and exerting yourself.  The breathing machine keeps your lungs expanded and your temperature down.   DIET:  As you were doing prior to hospitalization, we recommend a well-balanced diet.  DRESSING / WOUND CARE / SHOWERING  Keep the surgical dressing until follow up.  The dressing is water proof, so you can shower without any extra covering.  IF THE DRESSING FALLS OFF or the wound gets wet inside, change the dressing with sterile gauze.  Please use good hand washing techniques before changing the dressing.  Do not use any lotions or creams on the incision until instructed by  your surgeon.    ACTIVITY  Increase activity slowly as tolerated, but follow the weight bearing instructions below.   No driving for 6 weeks or until further direction given by your physician.  You cannot drive while taking narcotics.  No lifting or carrying greater than 10 lbs. until further directed by your surgeon. Avoid periods of inactivity such as sitting longer than an hour when not asleep. This helps prevent blood clots.  You may return to work once you are authorized by your doctor.     WEIGHT BEARING   Weight bearing as tolerated with assist device (walker, cane, etc) as directed, use it as long as suggested by your surgeon or therapist, typically at least 4-6 weeks.   EXERCISES  Results after joint replacement surgery are often greatly improved when you follow the exercise, range of motion and muscle strengthening exercises prescribed by your doctor. Safety measures are also important to protect the joint from further injury. Any time any of these exercises cause you to have increased pain or swelling, decrease what you are doing until you are comfortable again and then slowly increase them. If you have problems or questions, call your caregiver or physical therapist for advice.   Rehabilitation is important following a joint replacement. After just a few days of  immobilization, the muscles of the leg can become weakened and shrink (atrophy).  These exercises are designed to build up the tone and strength of the thigh and leg muscles and to improve motion. Often times heat used for twenty to thirty minutes before working out will loosen up your tissues and help with improving the range of motion but do not use heat for the first two weeks following surgery (sometimes heat can increase post-operative swelling).   These exercises can be done on a training (exercise) mat, on the floor, on a table or on a bed. Use whatever works the best and is most comfortable for you.    Use music or television while you are exercising so that the exercises are a pleasant break in your day. This will make your life better with the exercises acting as a break in your routine that you can look forward to.   Perform all exercises about fifteen times, three times per day or as directed.  You should exercise both the operative leg and the other leg as well.  Exercises include:   Quad Sets - Tighten up the muscle on the front of the thigh (Quad) and hold for 5-10 seconds.   Straight Leg Raises - With your knee straight (if you were given a brace, keep it on), lift the leg to 60 degrees, hold for 3 seconds, and slowly lower the leg.  Perform this exercise against resistance later as your leg gets stronger.  Leg Slides: Lying on your back, slowly slide your foot toward your buttocks, bending your knee up off the floor (only go as far as is comfortable). Then slowly slide your foot back down until your leg is flat on the floor again.  Angel Wings: Lying on your back spread your legs to the side as far apart as you can without causing discomfort.  Hamstring Strength:  Lying on your back, push your heel against the floor with your leg straight by tightening up the muscles of your buttocks.  Repeat, but this time bend your knee to a comfortable angle, and push your heel against the floor.  You  may put a pillow under the heel to make  it more comfortable if necessary.   A rehabilitation program following joint replacement surgery can speed recovery and prevent re-injury in the future due to weakened muscles. Contact your doctor or a physical therapist for more information on knee rehabilitation.    CONSTIPATION  Constipation is defined medically as fewer than three stools per week and severe constipation as less than one stool per week.  Even if you have a regular bowel pattern at home, your normal regimen is likely to be disrupted due to multiple reasons following surgery.  Combination of anesthesia, postoperative narcotics, change in appetite and fluid intake all can affect your bowels.   YOU MUST use at least one of the following options; they are listed in order of increasing strength to get the job done.  They are all available over the counter, and you may need to use some, POSSIBLY even all of these options:    Drink plenty of fluids (prune juice may be helpful) and high fiber foods Colace 100 mg by mouth twice a day  Senokot for constipation as directed and as needed Dulcolax (bisacodyl), take with full glass of water  Miralax (polyethylene glycol) once or twice a day as needed.  If you have tried all these things and are unable to have a bowel movement in the first 3-4 days after surgery call either your surgeon or your primary doctor.    If you experience loose stools or diarrhea, hold the medications until you stool forms back up.  If your symptoms do not get better within 1 week or if they get worse, check with your doctor.  If you experience "the worst abdominal pain ever" or develop nausea or vomiting, please contact the office immediately for further recommendations for treatment.   ITCHING:  If you experience itching with your medications, try taking only a single pain pill, or even half a pain pill at a time.  You can also use Benadryl over the counter for itching or  also to help with sleep.   TED HOSE STOCKINGS:  Use stockings on both legs until for at least 2 weeks or as directed by physician office. They may be removed at night for sleeping.  MEDICATIONS:  See your medication summary on the "After Visit Summary" that nursing will review with you.  You may have some home medications which will be placed on hold until you complete the course of blood thinner medication.  It is important for you to complete the blood thinner medication as prescribed.  PRECAUTIONS:  If you experience chest pain or shortness of breath - call 911 immediately for transfer to the hospital emergency department.   If you develop a fever greater that 101 F, purulent drainage from wound, increased redness or drainage from wound, foul odor from the wound/dressing, or calf pain - CONTACT YOUR SURGEON.                                                   FOLLOW-UP APPOINTMENTS:  If you do not already have a post-op appointment, please call the office for an appointment to be seen by your surgeon.  Guidelines for how soon to be seen are listed in your "After Visit Summary", but are typically between 1-4 weeks after surgery.  OTHER INSTRUCTIONS:   Knee Replacement:  Do not place pillow under knee, focus on  keeping the knee straight while resting. CPM instructions: 0-90 degrees, 2 hours in the morning, 2 hours in the afternoon, and 2 hours in the evening. Place foam block, curve side up under heel at all times except when in CPM or when walking.  DO NOT modify, tear, cut, or change the foam block in any way.   DENTAL ANTIBIOTICS:  In most cases prophylactic antibiotics for Dental procdeures after total joint surgery are not necessary.  Exceptions are as follows:  1. History of prior total joint infection  2. Severely immunocompromised (Organ Transplant, cancer chemotherapy, Rheumatoid biologic meds such as [place])  3. Poorly controlled diabetes (A1C &gt; 8.0, blood glucose over  200)  If you have one of these conditions, contact your surgeon for an antibiotic prescription, prior to your dental procedure.   MAKE SURE YOU:  Understand these instructions.  Get help right away if you are not doing well or get worse.    Thank you for letting us be a part of your medical care team.  It is a privilege we respect greatly.  We hope these instructions will help you stay on track for a fast and full recovery!   Discharge instructions   Complete by: As directed    INSTRUCTIONS AFTER JOINT REPLACEMENT   Remove items at home which could result in a fall. This includes throw rugs or furniture in walking pathways ICE to the affected joint every three hours while awake for 30 minutes at a time, for at least the first 3-5 days, and then as needed for pain and swelling.  Continue to use ice for pain and swelling. You may notice swelling that will progress down to the foot and ankle.  This is normal after surgery.  Elevate your leg when you are not up walking on it.   Continue to use the breathing machine you got in the hospital (incentive spirometer) which will help keep your temperature down.  It is common for your temperature to cycle up and down following surgery, especially at night when you are not up moving around and exerting yourself.  The breathing machine keeps your lungs expanded and your temperature down.   DIET:  As you were doing prior to hospitalization, we recommend a well-balanced diet.  DRESSING / WOUND CARE / SHOWERING  Keep the surgical dressing until follow up.  The dressing is water proof, so you can shower without any extra covering.  IF THE DRESSING FALLS OFF or the wound gets wet inside, change the dressing with sterile gauze.  Please use good hand washing techniques before changing the dressing.  Do not use any lotions or creams on the incision until instructed by your surgeon.    ACTIVITY  Increase activity slowly as tolerated, but follow the weight  bearing instructions below.   No driving for 6 weeks or until further direction given by your physician.  You cannot drive while taking narcotics.  No lifting or carrying greater than 10 lbs. until further directed by your surgeon. Avoid periods of inactivity such as sitting longer than an hour when not asleep. This helps prevent blood clots.  You may return to work once you are authorized by your doctor.     WEIGHT BEARING   Weight bearing as tolerated with assist device (walker, cane, etc) as directed, use it as long as suggested by your surgeon or therapist, typically at least 4-6 weeks.   EXERCISES  Results after joint replacement surgery are often greatly improved when you  follow the exercise, range of motion and muscle strengthening exercises prescribed by your doctor. Safety measures are also important to protect the joint from further injury. Any time any of these exercises cause you to have increased pain or swelling, decrease what you are doing until you are comfortable again and then slowly increase them. If you have problems or questions, call your caregiver or physical therapist for advice.   Rehabilitation is important following a joint replacement. After just a few days of immobilization, the muscles of the leg can become weakened and shrink (atrophy).  These exercises are designed to build up the tone and strength of the thigh and leg muscles and to improve motion. Often times heat used for twenty to thirty minutes before working out will loosen up your tissues and help with improving the range of motion but do not use heat for the first two weeks following surgery (sometimes heat can increase post-operative swelling).   These exercises can be done on a training (exercise) mat, on the floor, on a table or on a bed. Use whatever works the best and is most comfortable for you.    Use music or television while you are exercising so that the exercises are a pleasant break in your day.  This will make your life better with the exercises acting as a break in your routine that you can look forward to.   Perform all exercises about fifteen times, three times per day or as directed.  You should exercise both the operative leg and the other leg as well.  Exercises include:   Quad Sets - Tighten up the muscle on the front of the thigh (Quad) and hold for 5-10 seconds.   Straight Leg Raises - With your knee straight (if you were given a brace, keep it on), lift the leg to 60 degrees, hold for 3 seconds, and slowly lower the leg.  Perform this exercise against resistance later as your leg gets stronger.  Leg Slides: Lying on your back, slowly slide your foot toward your buttocks, bending your knee up off the floor (only go as far as is comfortable). Then slowly slide your foot back down until your leg is flat on the floor again.  Angel Wings: Lying on your back spread your legs to the side as far apart as you can without causing discomfort.  Hamstring Strength:  Lying on your back, push your heel against the floor with your leg straight by tightening up the muscles of your buttocks.  Repeat, but this time bend your knee to a comfortable angle, and push your heel against the floor.  You may put a pillow under the heel to make it more comfortable if necessary.   A rehabilitation program following joint replacement surgery can speed recovery and prevent re-injury in the future due to weakened muscles. Contact your doctor or a physical therapist for more information on knee rehabilitation.    CONSTIPATION  Constipation is defined medically as fewer than three stools per week and severe constipation as less than one stool per week.  Even if you have a regular bowel pattern at home, your normal regimen is likely to be disrupted due to multiple reasons following surgery.  Combination of anesthesia, postoperative narcotics, change in appetite and fluid intake all can affect your bowels.   YOU MUST  use at least one of the following options; they are listed in order of increasing strength to get the job done.  They are all available over  the counter, and you may need to use some, POSSIBLY even all of these options:    Drink plenty of fluids (prune juice may be helpful) and high fiber foods Colace 100 mg by mouth twice a day  Senokot for constipation as directed and as needed Dulcolax (bisacodyl), take with full glass of water  Miralax (polyethylene glycol) once or twice a day as needed.  If you have tried all these things and are unable to have a bowel movement in the first 3-4 days after surgery call either your surgeon or your primary doctor.    If you experience loose stools or diarrhea, hold the medications until you stool forms back up.  If your symptoms do not get better within 1 week or if they get worse, check with your doctor.  If you experience "the worst abdominal pain ever" or develop nausea or vomiting, please contact the office immediately for further recommendations for treatment.   ITCHING:  If you experience itching with your medications, try taking only a single pain pill, or even half a pain pill at a time.  You can also use Benadryl over the counter for itching or also to help with sleep.   TED HOSE STOCKINGS:  Use stockings on both legs until for at least 2 weeks or as directed by physician office. They may be removed at night for sleeping.  MEDICATIONS:  See your medication summary on the "After Visit Summary" that nursing will review with you.  You may have some home medications which will be placed on hold until you complete the course of blood thinner medication.  It is important for you to complete the blood thinner medication as prescribed.  PRECAUTIONS:  If you experience chest pain or shortness of breath - call 911 immediately for transfer to the hospital emergency department.   If you develop a fever greater that 101 F, purulent drainage from wound, increased  redness or drainage from wound, foul odor from the wound/dressing, or calf pain - CONTACT YOUR SURGEON.                                                   FOLLOW-UP APPOINTMENTS:  If you do not already have a post-op appointment, please call the office for an appointment to be seen by your surgeon.  Guidelines for how soon to be seen are listed in your "After Visit Summary", but are typically between 1-4 weeks after surgery.  OTHER INSTRUCTIONS:   Knee Replacement:  Do not place pillow under knee, focus on keeping the knee straight while resting. CPM instructions: 0-90 degrees, 2 hours in the morning, 2 hours in the afternoon, and 2 hours in the evening. Place foam block, curve side up under heel at all times except when in CPM or when walking.  DO NOT modify, tear, cut, or change the foam block in any way.   DENTAL ANTIBIOTICS:  In most cases prophylactic antibiotics for Dental procdeures after total joint surgery are not necessary.  Exceptions are as follows:  1. History of prior total joint infection  2. Severely immunocompromised (Organ Transplant, cancer chemotherapy, Rheumatoid biologic meds such as [place])  3. Poorly controlled diabetes (A1C &gt; 8.0, blood glucose over 200)  If you have one of these conditions, contact your surgeon for an antibiotic prescription, prior to your dental procedure.  MAKE SURE YOU:  Understand these instructions.  Get help right away if you are not doing well or get worse.    Thank you for letting us be a part of your medical care team.  It is a privilege we respect greatly.  We hope these instructions will help you stay on track for a fast and full recovery!   Increase activity slowly as tolerated   Complete by: As directed    Increase activity slowly as tolerated   Complete by: As directed       DISCHARGE MEDICATIONS:   Allergies as of 12/24/2019      Reactions   Keflex [cephalexin] Hives      Medication List    STOP taking these  medications   BLACK PEPPER-TURMERIC PO   bumetanide 0.5 MG tablet Commonly known as: BUMEX   gabapentin 100 MG capsule Commonly known as: Neurontin   HYALURONIC ACID PO   meclizine 25 MG tablet Commonly known as: ANTIVERT   methocarbamol 500 MG tablet Commonly known as: ROBAXIN   naproxen sodium 220 MG tablet Commonly known as: ALEVE   omeprazole 20 MG capsule Commonly known as: PRILOSEC   ondansetron 4 MG tablet Commonly known as: ZOFRAN     TAKE these medications   aspirin EC 325 MG tablet Take 1 tablet (325 mg total) by mouth 2 (two) times daily.   B-50 PO Take 1 tablet by mouth at bedtime.   diphenhydramine-acetaminophen 25-500 MG Tabs tablet Commonly known as: TYLENOL PM Take 2 tablets by mouth at bedtime.   levothyroxine 50 MCG tablet Commonly known as: SYNTHROID Take 50 mcg by mouth daily before breakfast.   multivitamin with minerals Tabs tablet Take 1 tablet by mouth at bedtime.   OVER THE COUNTER MEDICATION Take 2 capsules by mouth at bedtime. Memory and Nerve Capsules   oxyCODONE 5 MG immediate release tablet Commonly known as: Oxy IR/ROXICODONE Take 1 tablet (5 mg total) by mouth every 4 (four) hours as needed for severe pain. What changed: when to take this   tiZANidine 2 MG tablet Commonly known as: ZANAFLEX Take 1 tablet (2 mg total) by mouth every 6 (six) hours as needed for muscle spasms.            Durable Medical Equipment  (From admission, onward)         Start     Ordered   12/23/19 1356  DME Walker rolling  Once       Question:  Patient needs a walker to treat with the following condition  Answer:  S/P total knee replacement   12/23/19 1355   12/23/19 1356  DME 3 n 1  Once        12/23/19 1355   12/23/19 1356  DME Bedside commode  Once       Question:  Patient needs a bedside commode to treat with the following condition  Answer:  S/P total knee replacement   12/23/19 1355          FOLLOW UP VISIT:     DISPOSITION: HOME VS. SNF  Dental Antibiotics:  In most cases prophylactic antibiotics for Dental procdeures after total joint surgery are not necessary.  Exceptions are as follows:  1. History of prior total joint infection  2. Severely immunocompromised (Organ Transplant, cancer chemotherapy, Rheumatoid biologic meds such as [place])  3. Poorly controlled diabetes (A1C &gt; 8.0, blood glucose over 200)  If you have one of these conditions, contact your surgeon for an  antibiotic prescription, prior to your dental procedure.   CONDITION:  Good   Donia Ast 12/24/2019, 7:23 AM

## 2019-12-24 NOTE — Op Note (Signed)
TOTAL KNEE REPLACEMENT OPERATIVE NOTE:  12/23/2019  7:16 AM  PATIENT:  [name]  86 y.o. female  PRE-OPERATIVE DIAGNOSIS:  Osteoarthritis of right knee M17.11  POST-OPERATIVE DIAGNOSIS:  Osteoarthritis of right knee M17.11  PROCEDURE:  Procedure(s): TOTAL KNEE ARTHROPLASTY  SURGEON:  Surgeon(s): Vickey Huger, MD  PHYSICIAN ASSISTANT: Carlyon Shadow, PA-C   ANESTHESIA:   spinal  SPECIMEN: None  COUNTS:  Correct  TOURNIQUET:   Total Tourniquet Time Documented: Thigh (Right) - 50 minutes Total: Thigh (Right) - 50 minutes   DICTATION:  Indication for procedure:    The patient is a 86 y.o. female who has failed conservative treatment for Osteoarthritis of right knee M17.11.  Informed consent was obtained prior to anesthesia. The risks versus benefits of the operation were explain and in a way the patient can, and did, understand.     Description of procedure:     The patient was taken to the operating room and placed under anesthesia.  The patient was positioned in the usual fashion taking care that all body parts were adequately padded and/or protected.  A tourniquet was applied and the leg prepped and draped in the usual sterile fashion.  The extremity was exsanguinated with the esmarch and tourniquet inflated to 350 mmHg.  Pre-operative range of motion was normal.   A midline incision approximately 6-7 inches long was made with a #10 blade.  A new blade was used to make a parapatellar arthrotomy going 2-3 cm into the quadriceps tendon, over the patella, and alongside the medial aspect of the patellar tendon.  A synovectomy was then performed with the #10 blade and forceps. I then elevated the deep MCL off the medial tibial metaphysis subperiosteally around to the semimembranosus attachment.    I everted the patella and used calipers to measure patellar thickness.  I used the reamer to ream down to appropriate thickness to recreate the native thickness.  I  then removed excess bone with the rongeur and sagittal saw.  I used the appropriately sized template and drilled the three lug holes.  I then put the trial in place and measured the thickness with the calipers to ensure recreation of the native thickness.  The trial was then removed and the patella subluxed and the knee brought into flexion.  A homan retractor was place to retract and protect the patella and lateral structures.  A Z-retractor was place medially to protect the medial structures.  The extra-medullary alignment system was used to make cut the tibial articular surface perpendicular to the anamotic axis of the tibia and in 3 degrees of posterior slope.  The cut surface and alignment jig was removed.  I then used the intramedullary alignment guide to make a  valgus cut on the distal femur.  I then marked out the epicondylar axis on the distal femur. I then used the anterior referencing sizer and measured the femur to be a size 7.  The 4-In-1 cutting block was screwed into place in external rotation matching the posterior condylar angle, making our cuts perpendicular to the epicondylar axis.  Anterior, posterior and chamfer cuts were made with the sagittal saw.  The cutting block and cut pieces were removed.  A lamina spreader was placed in 90 degrees of flexion.  The ACL, PCL, menisci, and posterior condylar osteophytes were removed.  A 12 mm spacer blocked was found to offer good flexion and extension gap balance after minimal in degree releasing.   The scoop retractor was then placed  and the femoral finishing block was pinned in place.  The small sagittal saw was used as well as the lug drill to finish the femur.  The block and cut surfaces were removed and the medullary canal hole filled with autograft bone from the cut pieces.  The tibia was delivered forward in deep flexion and external rotation.  A size D tray was selected and pinned into place centered on the medial 1/3 of the tibial  tubercle.  The reamer and keel was used to prepare the tibia through the tray.    I then trialed with the size 7 femur, size D tibia, a 12 mm insert and the 32 patella.  I had excellent flexion/extension gap balance, excellent patella tracking.  Flexion was full and beyond 120 degrees; extension was zero.  These components were chosen and the staff opened them to me on the back table while the knee was lavaged copiously and the cement mixed.  The soft tissue was infiltrated with 60cc of exparel 1.3% through a 21 gauge needle.  I cemented in the components and removed all excess cement.  The polyethylene tibial component was snapped into place and the knee placed in extension while cement was hardening.  The capsule was infilltrated with a 60cc exparel/marcaine/saline mixture.   Once the cement was hard, the tourniquet was let down.  Hemostasis was obtained.  The arthrotomy was closed using a #1 stratofix running suture.  The deep soft tissues were closed with #0 vicryls and the subcuticular layer closed with #2-0 vicryl.  The skin was reapproximated and closed with 3.0 Monocryl.  The wound was covered with steristrips, aquacel dressing, and a TED stocking.   The patient was then awakened, extubated, and taken to the recovery room in stable condition.  BLOOD LOSS:  315XY COMPLICATIONS:  None.  PLAN OF CARE: Admit for overnight observation  PATIENT DISPOSITION:  PACU - hemodynamically stable.    Please fax a copy of this op note to my office at [phone] (please only include page 1 and 2 of the Case Information op note)

## 2019-12-24 NOTE — Plan of Care (Signed)
°  Problem: Education: °Goal: Knowledge of the prescribed therapeutic regimen will improve °Outcome: Adequate for Discharge °Goal: Individualized Educational Video(s) °Outcome: Adequate for Discharge °  °Problem: Activity: °Goal: Ability to avoid complications of mobility impairment will improve °Outcome: Adequate for Discharge °Goal: Range of joint motion will improve °Outcome: Adequate for Discharge °  °Problem: Clinical Measurements: °Goal: Postoperative complications will be avoided or minimized °Outcome: Adequate for Discharge °  °Problem: Pain Management: °Goal: Pain level will decrease with appropriate interventions °Outcome: Adequate for Discharge °  °Problem: Skin Integrity: °Goal: Will show signs of wound healing °Outcome: Adequate for Discharge °  °

## 2019-12-24 NOTE — TOC Transition Note (Signed)
Transition of Care [facility]) - CM/SW Discharge Note   Patient Details  Name: [name] MRN: [id number] Date of Birth: May 27, 1932  [facility] CM/SW Contact:  Lennart Pall, LCSW Phone Number: 12/24/2019, 10:41 AM   Clinical Narrative:     Met with pt and spouse this morning to review dc needs.  Pt anticipating dc today.  She is requesting HHPT until she transitions to OP - order is in and referral placed with [facility].  Pt also requesting another rolling walker as her home is split level.  She is aware she will have to pay for this privately and is agreeable.  Ordered via [employer] for delivery to her room prior to d/c.  No further TOC needs.  Final next level of care: [place] Barriers to Discharge: No Barriers Identified   Patient Goals and CMS Choice Patient states their goals for this hospitalization and ongoing recovery are:: return home CMS Medicare.gov Compare Post Acute Care list provided to:: Patient Choice offered to / list presented to : Patient  Discharge Placement                       Discharge Plan and Services                DME Arranged: Walker rolling DME Agency: [employer] Date DME Agency Contacted: 12/24/19 Time DME Agency Contacted: 1000 Representative spoke with at DME Agency: Freda Munro HH Arranged: PT [place]: [facility] (formerly [employer]) Date [place]: 12/24/19 Time [place]: 1040 Representative spoke with at [place]: [place] ([place]) Interventions     Readmission Risk Interventions No flowsheet data found.

## 2019-12-24 NOTE — Progress Notes (Signed)
SPORTS MEDICINE AND JOINT REPLACEMENT  Lara Mulch, MD    Carlyon Shadow, PA-C [address], [place], [place]  [address]                             [phone]   PROGRESS NOTE  Subjective:  negative for Chest Pain  negative for Shortness of Breath  negative for Nausea/Vomiting   negative for Calf Pain  negative for Bowel Movement   Tolerating Diet: yes         Patient reports pain as 4 on 0-10 scale.    Objective: Vital signs in last 24 hours:   Patient Vitals for the past 24 hrs:  BP Temp Temp src Pulse Resp SpO2 Height Weight  12/24/19 0706 139/74 97.6 F (36.4 C) Oral 65 18 98 % -- --  12/24/19 0154 120/69 (!) 97.5 F (36.4 C) Oral 65 18 97 % -- --  12/23/19 2320 137/69 98 F (36.7 C) Oral 74 18 96 % -- --  12/23/19 2235 125/84 98.2 F (36.8 C) Oral -- 18 97 % -- --  12/23/19 2134 126/85 98.2 F (36.8 C) Oral 87 18 97 % -- --  12/23/19 2039 134/84 98 F (36.7 C) Oral 96 18 95 % -- --  12/23/19 2024 131/87 98.3 F (36.8 C) -- 91 16 98 % -- --  12/23/19 2000 105/79 -- -- 93 -- 96 % -- --  12/23/19 1945 (!) 144/83 -- -- 95 16 98 % -- --  12/23/19 1837 (!) 147/96 -- -- 66 16 98 % -- --  12/23/19 1834 -- -- -- 70 16 97 % -- --  12/23/19 1745 (!) 142/94 -- -- 64 16 96 % -- --  12/23/19 1700 (!) 136/92 98.1 F (36.7 C) -- 66 16 97 % -- --  12/23/19 1600 (!) 143/79 -- -- 66 16 98 % -- --  12/23/19 1514 (!) 154/84 -- -- 64 -- 98 % -- --  12/23/19 1440 (!) 122/103 (!) 97.5 F (36.4 C) -- 61 18 96 % -- --  12/23/19 1430 (!) 149/73 (!) 97.5 F (36.4 C) -- 63 (!) 22 95 % -- --  12/23/19 1415 (!) 146/79 -- -- 66 19 95 % -- --  12/23/19 1400 (!) 135/91 -- -- 62 17 97 % -- --  12/23/19 1345 (!) 141/67 -- -- 65 16 100 % -- --  12/23/19 1341 133/69 (!) 97.5 F (36.4 C) -- 65 16 100 % -- --  12/23/19 1150 (!) 165/70 -- -- 67 12 100 % -- --  12/23/19 1149 -- -- -- 66 19 100 % -- --  12/23/19 1148 -- -- -- 66 14 100 % -- --  12/23/19 1147 -- -- -- 69 (!) 21 100 % --  --  12/23/19 1146 -- -- -- 70 14 100 % -- --  12/23/19 1145 (!) 149/75 -- -- 70 (!) 23 100 % -- --  12/23/19 1144 -- -- -- -- (!) 26 -- -- --  12/23/19 1143 -- -- -- -- (!) 22 -- -- --  12/23/19 1142 -- -- -- -- (!) 21 -- -- --  12/23/19 1141 -- -- -- -- 20 -- -- --  12/23/19 1140 140/74 -- -- -- 19 -- -- --  12/23/19 1139 -- -- -- -- 18 -- -- --  12/23/19 1138 -- -- -- -- (!) 22 -- -- --  12/23/19 1137 (!) 144/80 -- -- -- Marland Kitchen  26 -- -- --  12/23/19 1136 -- -- -- -- 20 -- -- --  12/23/19 1135 -- -- -- -- 20 -- -- --  12/23/19 1134 -- -- -- 69 20 100 % -- --  12/23/19 1133 -- -- -- -- 20 -- -- --  12/23/19 1132 -- -- -- -- (!) 21 -- -- --  12/23/19 1131 -- -- -- -- (!) 24 -- -- --  12/23/19 1130 131/76 -- -- 69 14 96 % -- --  12/23/19 0946 -- -- -- -- -- -- 5\' 6"  (1.676 m) 79.4 kg  12/23/19 0928 (!) 143/102 98.4 F (36.9 C) Oral 95 20 97 % -- --    @flow {1959:LAST@   Intake/Output from previous day:   09/20 0701 - 09/21 0700 In: 2546.8 [P.O.:720; I.V.:1726.8] Out: 1900 [Urine:1850]   Intake/Output this shift:   No intake/output data recorded.   Intake/Output      09/20 0701 - 09/21 0700 09/21 0701 - 09/22 0700   P.O. 720    I.V. (mL/kg) 1726.8 (21.7)    IV Piggyback 100    Total Intake(mL/kg) 2546.8 (32.1)    Urine (mL/kg/hr) 1850    Blood 50    Total Output 1900    Net +646.8         Urine Occurrence 1 x       LABORATORY DATA: Recent Labs    12/18/19 1435 12/24/19 0312  WBC 7.2 10.0  HGB 12.7 10.3*  HCT 38.8 31.6*  PLT 323 281   Recent Labs    12/18/19 1435 12/24/19 0312  NA 135 134*  K 4.3 4.5  CL 100 99  CO2 28 27  BUN 17 13  CREATININE 0.73 0.62  GLUCOSE 97 124*  CALCIUM 9.7 9.0   No results found for: INR, PROTIME  Examination:  General appearance: alert, cooperative and no distress Extremities: extremities normal, atraumatic, no cyanosis or edema  Wound Exam: clean, dry, intact   Drainage:  None: wound tissue dry  Motor Exam:  Quadriceps and Hamstrings Intact  Sensory Exam: Superficial Peroneal, Deep Peroneal and Tibial normal   Assessment:    1 Day Post-Op  Procedure(s) (LRB): TOTAL KNEE ARTHROPLASTY (Right)  ADDITIONAL DIAGNOSIS:  Active Problems:   S/P total knee arthroplasty, right     Plan: Physical Therapy as ordered Weight Bearing as Tolerated (WBAT)  DVT Prophylaxis:  Aspirin  DISCHARGE PLAN: Home  DISCHARGE NEEDS: HHPT       Patient's anticipated LOS is less than 2 midnights, meeting these requirements: - Lives within 1 hour of care - Has a competent adult at home to recover with post-op recover - NO history of  - Chronic pain requiring opiods  - Diabetes  - Coronary Artery Disease  - Heart failure  - Heart attack  - Stroke  - DVT/VTE  - Cardiac arrhythmia  - Respiratory Failure/COPD  - Renal failure  - Anemia  - Advanced Liver disease        Donia Ast 12/24/2019, 7:22 AM

## 2019-12-30 ENCOUNTER — Ambulatory Visit: Payer: Medicare Other | Admitting: Physical Therapy

## 2020-01-01 ENCOUNTER — Encounter: Payer: Self-pay | Admitting: Physical Therapy

## 2020-01-01 ENCOUNTER — Other Ambulatory Visit: Payer: Self-pay

## 2020-01-01 ENCOUNTER — Ambulatory Visit: Payer: Medicare Other | Attending: Orthopedic Surgery | Admitting: Physical Therapy

## 2020-01-01 DIAGNOSIS — M25561 Pain in right knee: Secondary | ICD-10-CM | POA: Diagnosis not present

## 2020-01-01 DIAGNOSIS — R600 Localized edema: Secondary | ICD-10-CM | POA: Diagnosis present

## 2020-01-01 DIAGNOSIS — R262 Difficulty in walking, not elsewhere classified: Secondary | ICD-10-CM | POA: Insufficient documentation

## 2020-01-01 DIAGNOSIS — M25661 Stiffness of right knee, not elsewhere classified: Secondary | ICD-10-CM | POA: Diagnosis present

## 2020-01-01 NOTE — Therapy (Signed)
[facility]. [place], [place], [address] Phone: [phone]   Fax:  [phone]  Physical Therapy Evaluation  Patient Details  Name: Samar Venneman MRN: [id number] Date of Birth: [birth year] Referring Provider (PT): Lucey   Encounter Date: 01/01/2020   PT End of Session - 01/01/20 1543    Visit Number 1    Date for PT Re-Evaluation 03/02/20    PT Start Time 1520    PT Stop Time 1610    PT Time Calculation (min) 50 min    Activity Tolerance Patient tolerated treatment well    Behavior During Therapy [facility] for tasks assessed/performed           Past Medical History:  Diagnosis Date  . Acalculous cholecystitis 08/24/2018  . Arthritis   . DVT of lower extremity (deep venous thrombosis) ([place])    right leg - after back surgery in 2019  . GERD (gastroesophageal reflux disease)   . History of blood transfusion   . Hypothyroidism   . PONV (postoperative nausea and vomiting)   . Ptosis of both eyelids   . Vertigo   . Wears glasses    reading    Past Surgical History:  Procedure Laterality Date  . ABDOMINAL HYSTERECTOMY  1986  . APPLICATION OF ROBOTIC ASSISTANCE FOR SPINAL PROCEDURE N/A 07/17/2017   Procedure: APPLICATION OF ROBOTIC ASSISTANCE FOR SPINAL PROCEDURE;  Surgeon: Kristeen Miss, MD;  Location: [place];  Service: Neurosurgery;  Laterality: N/A;  . APPLICATION OF ROBOTIC ASSISTANCE FOR SPINAL PROCEDURE N/A 09/01/2017   Procedure: APPLICATION OF ROBOTIC ASSISTANCE FOR SPINAL PROCEDURE;  Surgeon: Kristeen Miss, MD;  Location: [place];  Service: Neurosurgery;  Laterality: N/A;  . [place]  2000   right  . CARPAL TUNNEL RELEASE  2012   left  . CHOLECYSTECTOMY N/A 08/24/2018   Procedure: LAPAROSCOPIC CHOLECYSTECTOMY WITH INTRAOPERATIVE CHOLANGIOGRAM;  Surgeon: Fanny Skates, MD;  Location: [place];  Service: General;  Laterality: N/A;  . COLONOSCOPY    .  CYSTOCELE REPAIR  2007   sling  . DISTAL INTERPHALANGEAL JOINT FUSION Right 02/04/2014   Procedure: FUSION DISTAL INTERPHALANGEAL JOINT RIGHT INDEX FINGER ;  Surgeon: Daryll Brod, MD;  Location: [place];  Service: Orthopedics;  Laterality: Right;  . EYE SURGERY Bilateral    Cataract surgery with lens implant  . KNEE SURGERY  2009,2011   partial knee  . LAPAROSCOPY N/A 08/24/2018   Procedure: LAPAROSCOPY DIAGNOSTIC;  Surgeon: Fanny Skates, MD;  Location: [place];  Service: General;  Laterality: N/A;  . OPEN REDUCTION INTERNAL FIXATION (ORIF) PROXIMAL PHALANX Left 08/30/2013   Procedure: OPEN REDUCTION INTERNAL FIXATION (ORIF) PROXIMAL PHALANX FRACTURE LEFT SMALL FINGER; SPLINT RING FINGER;  Surgeon: Wynonia Sours, MD;  Location: [place];  Service: Orthopedics;  Laterality: Left;  . POSTERIOR LUMBAR FUSION 4 LEVEL N/A 07/17/2017   Procedure: Thoracic Ten - Lumbar Five revison of hardware with Mazor;  Surgeon: Kristeen Miss, MD;  Location: [place];  Service: Neurosurgery;  Laterality: N/A;  Thoracic/Lumbar  . PTOSIS REPAIR Bilateral 07/27/2015   Procedure: PTOSIS REPAIR;  Surgeon: Cristine Polio, MD;  Location: [place];  Service: Plastics;  Laterality: Bilateral;  . SHOULDER SURGERY     rt rcr,and lt  . TONSILLECTOMY    . TOTAL KNEE ARTHROPLASTY Right 12/23/2019   Procedure: TOTAL KNEE ARTHROPLASTY;  Surgeon: Vickey Huger, MD;  [facility];  Service: Orthopedics;  Laterality: Right;    There were no vitals filed for this visit.    Subjective Assessment - 01/01/20 1525    Subjective Patient underwent a right TKA on 12/23/19.  She had a one night hospital, had PT at home 3 visits.  She reports that she is very swollen and in a lot of pain    Patient Stated Goals have less pain, better motions , walk without device    Currently in Pain? Yes    Pain Score 8     Pain Location Knee    Pain Orientation Right;Medial    Pain Descriptors /  Indicators Aching    Pain Type Acute pain;Surgical pain    Pain Onset 1 to 4 weeks ago    Pain Frequency Constant    Aggravating Factors  Pain a 9-10/10 with touching it, standing and bending    Pain Relieving Factors take pain meds, rest, ice pain at times can be 0/10    Effect of Pain on Daily Activities limits everything              [facility] PT Assessment - 01/01/20 0001      Assessment   Medical Diagnosis s/p right TKA    Referring Provider (PT) Lucey    Onset Date/Surgical Date 12/23/19    Prior Therapy no      Precautions   Precautions None      Balance Screen   Has the patient fallen in the past 6 months No    Has the patient had a decrease in activity level because of a fear of falling?  No    Is the patient reluctant to leave their home because of a fear of falling?  No      Home Environment   Additional Comments split level home, needs to be able to do this, likes to do yardwork      Prior Function   Level of Independence Independent with community mobility with device    Vocation Retired      Observation/Other Assessments-Edema    Edema Circumferential      Circumferential Edema   Circumferential - Right 56 cm     Circumferential - Left  45 cm      ROM / Strength   AROM / PROM / Strength AROM;PROM;Strength      AROM   Overall AROM Comments pain with AROM    AROM Assessment Site Knee    Right/Left Knee Right    Right Knee Extension 15    Right Knee Flexion 82      PROM   Overall PROM Comments pain iwht PROM    PROM Assessment Site Knee    Right/Left Knee Right    Right Knee Extension 10    Right Knee Flexion 90      Strength   Overall Strength Comments 3+/5 with pain      Palpation   Palpation comment very swollen, very tender to even light palpation, very tight skin, purple down to the ankle , pitting edema      Ambulation/Gait   Gait Comments slow with FWW, step to pattern      Standardized Balance Assessment   Standardized Balance  Assessment Timed Up and Go Test      Timed Up and Go Test   Normal TUG (seconds) 30                      Objective measurements  completed on examination: See above findings.       [facility] Adult PT Treatment/Exercise - 01/01/20 0001      Exercises   Exercises Knee/Hip      Knee/Hip Exercises: Aerobic   Nustep level 3 6 minutes      Modalities   Modalities Vasopneumatic      Vasopneumatic   Number Minutes Vasopneumatic  15 minutes    Vasopnuematic Location  Knee    Vasopneumatic Pressure Medium    Vasopneumatic Temperature  35                    PT Short Term Goals - 01/01/20 1554      PT SHORT TERM GOAL #1   Title independent with initial HEP    Time 2    Period Weeks    Status New             PT Long Term Goals - 01/01/20 1554      PT LONG TERM GOAL #1   Title decrease pain 50%    Time 8    Period Weeks    Status New      PT LONG TERM GOAL #2   Title increase AROM of the right knee to 6-115 degrees flexion    Time 8    Period Weeks    Status New      PT LONG TERM GOAL #3   Title increase LE strength to 4+/5    Time 8    Period Weeks    Status New      PT LONG TERM GOAL #4   Title go up and down stairs step over step    Time 8    Period Weeks    Status New      PT LONG TERM GOAL #5   Title decrease TUG to 19 seconds    Time 8    Period Weeks    Status New                  Plan - 01/01/20 1545    Clinical Impression Statement Patient underwent a right TKA on 12/23/19, she had a one night hospital stay, had 3 visits from home PT.  She reports significant pain and swelling, she has significant bruising and pitting edema of the right lower leg.  Her TUG time 30 seconds with FWW, AROM 15-82 degrees flexion.    Stability/Clinical Decision Making Evolving/Moderate complexity    Clinical Decision Making Low    Rehab Potential Good    PT Frequency 2x / week    PT Duration 8 weeks    PT Treatment/Interventions  ADLs/Self Care Home Management;Cryotherapy;Gait training;Neuromuscular re-education;Balance training;Therapeutic exercise;Therapeutic activities;Functional mobility training;Stair training;Patient/family education;Manual techniques;Vasopneumatic Device    PT Next Visit Plan slowly progress    Consulted and Agree with Plan of Care Patient           Patient will benefit from skilled therapeutic intervention in order to improve the following deficits and impairments:  Abnormal gait, Decreased range of motion, Decreased balance, Decreased scar mobility, Impaired flexibility, Increased edema, Decreased strength, Decreased mobility, Decreased endurance, Pain  Visit Diagnosis: Acute pain of right knee - Plan: PT plan of care cert/re-cert  Stiffness of right knee, not elsewhere classified - Plan: PT plan of care cert/re-cert  Localized edema - Plan: PT plan of care cert/re-cert  Difficulty in walking, not elsewhere classified - Plan: PT plan of care cert/re-cert     Problem List  Patient Active Problem List   Diagnosis Date Noted  . S/P total knee arthroplasty, right 12/23/2019  . Acalculous cholecystitis 08/24/2018  . Osteoarthritis of right knee   . Vertigo   . DDD (degenerative disc disease), cervical   . Benign essential HTN   . History of DVT (deep vein thrombosis)   . Tachycardia   . Steroid-induced hyperglycemia   . Hypothyroidism   . Neuropathic pain   . Acute blood loss anemia   . S/P lumbar fusion   . Closed L5 vertebral fracture ([place]) 08/31/2017  . Closed fracture of fifth lumbar vertebra ([place]) 08/30/2017  . Lumbar vertebral fracture ([place]) 07/17/2017  . Scoliosis 06/13/2017    Sumner Boast., PT 01/01/2020, 4:12 PM  [facility]. [place], [place], [address] Phone: [phone]   Fax:  [phone]  Name: Myrtha Tonkovich MRN: [id number] Date of Birth: 1932-12-09

## 2020-01-07 ENCOUNTER — Ambulatory Visit: Payer: Medicare Other | Admitting: Physical Therapy

## 2020-01-09 ENCOUNTER — Ambulatory Visit: Payer: Medicare Other | Attending: Orthopedic Surgery | Admitting: Physical Therapy

## 2020-01-09 ENCOUNTER — Encounter: Payer: Self-pay | Admitting: Physical Therapy

## 2020-01-09 ENCOUNTER — Other Ambulatory Visit: Payer: Self-pay

## 2020-01-09 DIAGNOSIS — M6281 Muscle weakness (generalized): Secondary | ICD-10-CM

## 2020-01-09 DIAGNOSIS — R600 Localized edema: Secondary | ICD-10-CM | POA: Insufficient documentation

## 2020-01-09 DIAGNOSIS — M25661 Stiffness of right knee, not elsewhere classified: Secondary | ICD-10-CM | POA: Insufficient documentation

## 2020-01-09 DIAGNOSIS — R262 Difficulty in walking, not elsewhere classified: Secondary | ICD-10-CM | POA: Diagnosis present

## 2020-01-09 DIAGNOSIS — M25561 Pain in right knee: Secondary | ICD-10-CM

## 2020-01-09 NOTE — Therapy (Signed)
[facility]. [place], [place], [address] Phone: [phone]   Fax:  [phone]  Physical Therapy Treatment  Patient Details  Name: [name] MRN: [id number] Date of Birth: Oct 01, 1932 Referring Provider (PT): Lucey   Encounter Date: 01/09/2020   PT End of Session - 01/09/20 1515    Visit Number 2    Date for PT Re-Evaluation 03/02/20    PT Start Time 1430    PT Stop Time 1526    PT Time Calculation (min) 56 min    Activity Tolerance Patient tolerated treatment well    Behavior During Therapy [facility] for tasks assessed/performed           Past Medical History:  Diagnosis Date  . Acalculous cholecystitis 08/24/2018  . Arthritis   . DVT of lower extremity (deep venous thrombosis) ([place])    right leg - after back surgery in 2019  . GERD (gastroesophageal reflux disease)   . History of blood transfusion   . Hypothyroidism   . PONV (postoperative nausea and vomiting)   . Ptosis of both eyelids   . Vertigo   . Wears glasses    reading    Past Surgical History:  Procedure Laterality Date  . ABDOMINAL HYSTERECTOMY  1986  . APPLICATION OF ROBOTIC ASSISTANCE FOR SPINAL PROCEDURE N/A 07/17/2017   Procedure: APPLICATION OF ROBOTIC ASSISTANCE FOR SPINAL PROCEDURE;  Surgeon: Kristeen Miss, MD;  Location: [place];  Service: Neurosurgery;  Laterality: N/A;  . APPLICATION OF ROBOTIC ASSISTANCE FOR SPINAL PROCEDURE N/A 09/01/2017   Procedure: APPLICATION OF ROBOTIC ASSISTANCE FOR SPINAL PROCEDURE;  Surgeon: Kristeen Miss, MD;  Location: [place];  Service: Neurosurgery;  Laterality: N/A;  . [place]  2000   right  . CARPAL TUNNEL RELEASE  2012   left  . CHOLECYSTECTOMY N/A 08/24/2018   Procedure: LAPAROSCOPIC CHOLECYSTECTOMY WITH INTRAOPERATIVE CHOLANGIOGRAM;  Surgeon: Fanny Skates, MD;  Location: [place];  Service: General;  Laterality: N/A;  . COLONOSCOPY    .  CYSTOCELE REPAIR  2007   sling  . DISTAL INTERPHALANGEAL JOINT FUSION Right 02/04/2014   Procedure: FUSION DISTAL INTERPHALANGEAL JOINT RIGHT INDEX FINGER ;  Surgeon: Daryll Brod, MD;  Location: [place];  Service: Orthopedics;  Laterality: Right;  . EYE SURGERY Bilateral    Cataract surgery with lens implant  . KNEE SURGERY  2009,2011   partial knee  . LAPAROSCOPY N/A 08/24/2018   Procedure: LAPAROSCOPY DIAGNOSTIC;  Surgeon: Fanny Skates, MD;  Location: [place];  Service: General;  Laterality: N/A;  . OPEN REDUCTION INTERNAL FIXATION (ORIF) PROXIMAL PHALANX Left 08/30/2013   Procedure: OPEN REDUCTION INTERNAL FIXATION (ORIF) PROXIMAL PHALANX FRACTURE LEFT SMALL FINGER; SPLINT RING FINGER;  Surgeon: Wynonia Sours, MD;  Location: [place];  Service: Orthopedics;  Laterality: Left;  . POSTERIOR LUMBAR FUSION 4 LEVEL N/A 07/17/2017   Procedure: Thoracic Ten - Lumbar Five revison of hardware with Mazor;  Surgeon: Kristeen Miss, MD;  Location: [place];  Service: Neurosurgery;  Laterality: N/A;  Thoracic/Lumbar  . PTOSIS REPAIR Bilateral 07/27/2015   Procedure: PTOSIS REPAIR;  Surgeon: Cristine Polio, MD;  Location: [place];  Service: Plastics;  Laterality: Bilateral;  . SHOULDER SURGERY     rt rcr,and lt  . TONSILLECTOMY    . TOTAL KNEE ARTHROPLASTY Right 12/23/2019   Procedure: TOTAL KNEE ARTHROPLASTY;  Surgeon: Vickey Huger, MD;  [facility];  Service: Orthopedics;  Laterality: Right;    There were no vitals filed for this visit.   Subjective Assessment - 01/09/20 1428    Subjective Patient reports that today is her worst day since the surgery, really hurting today    Currently in Pain? Yes    Pain Score 8     Pain Orientation Right    Pain Descriptors / Indicators Sore    Aggravating Factors  weather, just wewlling and aching              [facility] PT Assessment - 01/09/20 0001      AROM   Right Knee Flexion 95      PROM   Right  Knee Flexion 105                         [facility] Adult PT Treatment/Exercise - 01/09/20 0001      Knee/Hip Exercises: Aerobic   Recumbent Bike seat #5 x 6 minutes, full revolutions after about a minute    Nustep level 3 7 minutes      Knee/Hip Exercises: Machines for Strengthening   Cybex Knee Flexion 20# 3x10      Knee/Hip Exercises: Seated   Long Arc Quad Right;3 sets;10 reps    Long Arc Quad Weight 2 lbs.    Long [employer] Limitations verbal and tactile cues for [employer] 15    Other Seated Knee/Hip Exercises ankle pumps    Marching Right;2 sets;10 reps    Marching Limitations 2      Vasopneumatic   Number Minutes Vasopneumatic  10 minutes    Vasopnuematic Location  Knee    Vasopneumatic Pressure Medium    Vasopneumatic Temperature  35      Manual Therapy   Manual Therapy Passive ROM;Soft tissue mobilization    Soft tissue mobilization gentle to thew scar    Passive ROM flexion and extension, gentle distraction as well                    PT Short Term Goals - 01/09/20 1520      PT SHORT TERM GOAL #1   Title independent with initial HEP    Status Achieved             PT Long Term Goals - 01/01/20 1554      PT LONG TERM GOAL #1   Title decrease pain 50%    Time 8    Period Weeks    Status New      PT LONG TERM GOAL #2   Title increase AROM of the right knee to 6-115 degrees flexion    Time 8    Period Weeks    Status New      PT LONG TERM GOAL #3   Title increase LE strength to 4+/5    Time 8    Period Weeks    Status New      PT LONG TERM GOAL #4   Title go up and down stairs step over step    Time 8    Period Weeks    Status New      PT LONG TERM GOAL #5   Title decrease TUG to 19 seconds    Time 8    Period Weeks    Status New                 Plan - 01/09/20 1517  Clinical Impression Statement Patient doing very well, she is tender in the scar area, ROM is improving, very stiff at first and  difficulty going into extension, but with good verbal and tactile cues she did better    PT Next Visit Plan may try walking with a SPC    Consulted and Agree with Plan of Care Patient           Patient will benefit from skilled therapeutic intervention in order to improve the following deficits and impairments:  Abnormal gait, Decreased range of motion, Decreased balance, Decreased scar mobility, Impaired flexibility, Increased edema, Decreased strength, Decreased mobility, Decreased endurance, Pain  Visit Diagnosis: Acute pain of right knee  Stiffness of right knee, not elsewhere classified  Localized edema  Difficulty in walking, not elsewhere classified  Muscle weakness (generalized)     Problem List Patient Active Problem List   Diagnosis Date Noted  . S/P total knee arthroplasty, right 12/23/2019  . Acalculous cholecystitis 08/24/2018  . Osteoarthritis of right knee   . Vertigo   . DDD (degenerative disc disease), cervical   . Benign essential HTN   . History of DVT (deep vein thrombosis)   . Tachycardia   . Steroid-induced hyperglycemia   . Hypothyroidism   . Neuropathic pain   . Acute blood loss anemia   . S/P lumbar fusion   . Closed L5 vertebral fracture ([place]) 08/31/2017  . Closed fracture of fifth lumbar vertebra ([place]) 08/30/2017  . Lumbar vertebral fracture ([place]) 07/17/2017  . Scoliosis 06/13/2017    Sumner Boast., PT 01/09/2020, 3:22 PM  [facility]. [place], [place], [address] Phone: [phone]   Fax:  [phone]  Name: Anjelica Gorniak MRN: [id number] Date of Birth: [birth year]

## 2020-01-14 ENCOUNTER — Other Ambulatory Visit: Payer: Self-pay | Admitting: Internal Medicine

## 2020-01-14 ENCOUNTER — Other Ambulatory Visit: Payer: Self-pay

## 2020-01-14 ENCOUNTER — Ambulatory Visit: Payer: Medicare Other | Admitting: Physical Therapy

## 2020-01-14 ENCOUNTER — Encounter: Payer: Self-pay | Admitting: Physical Therapy

## 2020-01-14 DIAGNOSIS — M25561 Pain in right knee: Secondary | ICD-10-CM

## 2020-01-14 DIAGNOSIS — R262 Difficulty in walking, not elsewhere classified: Secondary | ICD-10-CM

## 2020-01-14 DIAGNOSIS — M6281 Muscle weakness (generalized): Secondary | ICD-10-CM

## 2020-01-14 DIAGNOSIS — Z1231 Encounter for screening mammogram for malignant neoplasm of breast: Secondary | ICD-10-CM

## 2020-01-14 DIAGNOSIS — M25661 Stiffness of right knee, not elsewhere classified: Secondary | ICD-10-CM

## 2020-01-14 DIAGNOSIS — R600 Localized edema: Secondary | ICD-10-CM

## 2020-01-14 NOTE — Therapy (Signed)
[facility]. [place], [place], [address] Phone: [phone]   Fax:  [phone]  Physical Therapy Treatment  Patient Details  Name: [name] MRN: [id number] Date of Birth: [birth year] Referring Provider (PT): Lucey   Encounter Date: 01/14/2020   PT End of Session - 01/14/20 1517    Visit Number 3    Date for PT Re-Evaluation 03/02/20    PT Start Time 4034    PT Stop Time 1530    PT Time Calculation (min) 57 min    Activity Tolerance Patient tolerated treatment well    Behavior During Therapy [facility] for tasks assessed/performed           Past Medical History:  Diagnosis Date  . Acalculous cholecystitis 08/24/2018  . Arthritis   . DVT of lower extremity (deep venous thrombosis) ([place])    right leg - after back surgery in 2019  . GERD (gastroesophageal reflux disease)   . History of blood transfusion   . Hypothyroidism   . PONV (postoperative nausea and vomiting)   . Ptosis of both eyelids   . Vertigo   . Wears glasses    reading    Past Surgical History:  Procedure Laterality Date  . ABDOMINAL HYSTERECTOMY  1986  . APPLICATION OF ROBOTIC ASSISTANCE FOR SPINAL PROCEDURE N/A 07/17/2017   Procedure: APPLICATION OF ROBOTIC ASSISTANCE FOR SPINAL PROCEDURE;  Surgeon: Kristeen Miss, MD;  Location: [place];  Service: Neurosurgery;  Laterality: N/A;  . APPLICATION OF ROBOTIC ASSISTANCE FOR SPINAL PROCEDURE N/A 09/01/2017   Procedure: APPLICATION OF ROBOTIC ASSISTANCE FOR SPINAL PROCEDURE;  Surgeon: Kristeen Miss, MD;  Location: [place];  Service: Neurosurgery;  Laterality: N/A;  . [place]  2000   right  . CARPAL TUNNEL RELEASE  2012   left  . CHOLECYSTECTOMY N/A 08/24/2018   Procedure: LAPAROSCOPIC CHOLECYSTECTOMY WITH INTRAOPERATIVE CHOLANGIOGRAM;  Surgeon: Fanny Skates, MD;  Location: [place];  Service: General;  Laterality: N/A;  . COLONOSCOPY    .  CYSTOCELE REPAIR  2007   sling  . DISTAL INTERPHALANGEAL JOINT FUSION Right 02/04/2014   Procedure: FUSION DISTAL INTERPHALANGEAL JOINT RIGHT INDEX FINGER ;  Surgeon: Daryll Brod, MD;  Location: [place];  Service: Orthopedics;  Laterality: Right;  . EYE SURGERY Bilateral    Cataract surgery with lens implant  . KNEE SURGERY  2009,2011   partial knee  . LAPAROSCOPY N/A 08/24/2018   Procedure: LAPAROSCOPY DIAGNOSTIC;  Surgeon: Fanny Skates, MD;  Location: [place];  Service: General;  Laterality: N/A;  . OPEN REDUCTION INTERNAL FIXATION (ORIF) PROXIMAL PHALANX Left 08/30/2013   Procedure: OPEN REDUCTION INTERNAL FIXATION (ORIF) PROXIMAL PHALANX FRACTURE LEFT SMALL FINGER; SPLINT RING FINGER;  Surgeon: Wynonia Sours, MD;  Location: [place];  Service: Orthopedics;  Laterality: Left;  . POSTERIOR LUMBAR FUSION 4 LEVEL N/A 07/17/2017   Procedure: Thoracic Ten - Lumbar Five revison of hardware with Mazor;  Surgeon: Kristeen Miss, MD;  Location: [place];  Service: Neurosurgery;  Laterality: N/A;  Thoracic/Lumbar  . PTOSIS REPAIR Bilateral 07/27/2015   Procedure: PTOSIS REPAIR;  Surgeon: Cristine Polio, MD;  Location: [place];  Service: Plastics;  Laterality: Bilateral;  . SHOULDER SURGERY     rt rcr,and lt  . TONSILLECTOMY    . TOTAL KNEE ARTHROPLASTY Right 12/23/2019   Procedure: TOTAL KNEE ARTHROPLASTY;  Surgeon: Vickey Huger, MD;  [facility];  Service: Orthopedics;  Laterality: Right;    There were no vitals filed for this visit.   Subjective Assessment - 01/14/20 1440    Subjective Reports that sleep and at night is the worst, ache medial knee    Currently in Pain? Yes    Pain Score 7     Pain Location Knee    Pain Orientation Right;Medial    Pain Descriptors / Indicators Aching    Aggravating Factors  worse at night                             [facility] Adult PT Treatment/Exercise - 01/14/20 0001      Ambulation/Gait    Gait Comments gait with SPC x 100 feet, cues for hand and sequencing, she reports feeling unsafe, "my knee might buckle"      Knee/Hip Exercises: Aerobic   Recumbent Bike seat #5 x 6 minutes, full revolutions after about a minute    Nustep level 4 7 minutes      Knee/Hip Exercises: Machines for Strengthening   Cybex Knee Extension 5# 2x10    Cybex Knee Flexion 20# 3x10      Knee/Hip Exercises: Supine   Short Arc Quad Sets Right;3 sets;10 reps    Short Arc Quad Sets Limitations cues for [employer]   Number Minutes Vasopneumatic  10 minutes    Vasopnuematic Location  Knee    Vasopneumatic Pressure Medium    Vasopneumatic Temperature  35      Manual Therapy   Manual Therapy Passive ROM;Soft tissue mobilization    Soft tissue mobilization gentle to the scar    Passive ROM flexion and extension, gentle distraction as well                    PT Short Term Goals - 01/09/20 1520      PT SHORT TERM GOAL #1   Title independent with initial HEP    Status Achieved             PT Long Term Goals - 01/14/20 1519      PT LONG TERM GOAL #1   Title decrease pain 50%    Status On-going                 Plan - 01/14/20 1518    Clinical Impression Statement Patient did well for the first time with a SPC, she just did not feel steady, she is very sensitive to the right medial knee and scar, reported that the STM to this area felt good.  Added knee extension today    PT Next Visit Plan continue to work on gait    Consulted and Agree with Plan of Care Patient           Patient will benefit from skilled therapeutic intervention in order to improve the following deficits and impairments:  Abnormal gait, Decreased range of motion, Decreased balance, Decreased scar mobility, Impaired flexibility, Increased edema, Decreased strength, Decreased mobility, Decreased endurance, Pain  Visit Diagnosis: Acute pain of right knee  Stiffness of right knee, not  elsewhere classified  Localized edema  Difficulty in walking, not elsewhere classified  Muscle weakness (generalized)     Problem List Patient Active Problem List   Diagnosis Date Noted  . S/P total knee arthroplasty, right 12/23/2019  . Acalculous cholecystitis 08/24/2018  . Osteoarthritis of right knee   . Vertigo   .  DDD (degenerative disc disease), cervical   . Benign essential HTN   . History of DVT (deep vein thrombosis)   . Tachycardia   . Steroid-induced hyperglycemia   . Hypothyroidism   . Neuropathic pain   . Acute blood loss anemia   . S/P lumbar fusion   . Closed L5 vertebral fracture ([place]) 08/31/2017  . Closed fracture of fifth lumbar vertebra ([place]) 08/30/2017  . Lumbar vertebral fracture ([place]) 07/17/2017  . Scoliosis 06/13/2017    Sumner Boast., PT 01/14/2020, 3:20 PM  [facility]. [place], [place], [address] Phone: [phone]   Fax:  [phone]  Name: [name] MRN: [id number] Date of Birth: Sep 04, 1932

## 2020-01-16 ENCOUNTER — Other Ambulatory Visit: Payer: Self-pay

## 2020-01-16 ENCOUNTER — Encounter: Payer: Self-pay | Admitting: Physical Therapy

## 2020-01-16 ENCOUNTER — Ambulatory Visit: Payer: Medicare Other | Admitting: Physical Therapy

## 2020-01-16 DIAGNOSIS — M25661 Stiffness of right knee, not elsewhere classified: Secondary | ICD-10-CM

## 2020-01-16 DIAGNOSIS — R262 Difficulty in walking, not elsewhere classified: Secondary | ICD-10-CM

## 2020-01-16 DIAGNOSIS — M25561 Pain in right knee: Secondary | ICD-10-CM | POA: Diagnosis not present

## 2020-01-16 DIAGNOSIS — R600 Localized edema: Secondary | ICD-10-CM

## 2020-01-16 NOTE — Therapy (Signed)
[place]. [place], [place], [address] Phone: [phone]   Fax:  [phone]  Physical Therapy Treatment  Patient Details  Name: [name] MRN: [id number] Date of Birth: 1932-04-05 Referring Provider (PT): Lucey   Encounter Date: 01/16/2020   PT End of Session - 01/16/20 1512    Visit Number 4    Date for PT Re-Evaluation 03/02/20    PT Start Time 1430    PT Stop Time 1530    PT Time Calculation (min) 60 min    Activity Tolerance Patient tolerated treatment well    Behavior During Therapy [facility] for tasks assessed/performed           Past Medical History:  Diagnosis Date  . Acalculous cholecystitis 08/24/2018  . Arthritis   . DVT of lower extremity (deep venous thrombosis) ([place])    right leg - after back surgery in 2019  . GERD (gastroesophageal reflux disease)   . History of blood transfusion   . Hypothyroidism   . PONV (postoperative nausea and vomiting)   . Ptosis of both eyelids   . Vertigo   . Wears glasses    reading    Past Surgical History:  Procedure Laterality Date  . ABDOMINAL HYSTERECTOMY  1986  . APPLICATION OF ROBOTIC ASSISTANCE FOR SPINAL PROCEDURE N/A 07/17/2017   Procedure: APPLICATION OF ROBOTIC ASSISTANCE FOR SPINAL PROCEDURE;  Surgeon: Kristeen Miss, MD;  Location: [place];  Service: Neurosurgery;  Laterality: N/A;  . APPLICATION OF ROBOTIC ASSISTANCE FOR SPINAL PROCEDURE N/A 09/01/2017   Procedure: APPLICATION OF ROBOTIC ASSISTANCE FOR SPINAL PROCEDURE;  Surgeon: Kristeen Miss, MD;  Location: [place];  Service: Neurosurgery;  Laterality: N/A;  . [place]  2000   right  . CARPAL TUNNEL RELEASE  2012   left  . CHOLECYSTECTOMY N/A 08/24/2018   Procedure: LAPAROSCOPIC CHOLECYSTECTOMY WITH INTRAOPERATIVE CHOLANGIOGRAM;  Surgeon: Fanny Skates, MD;  Location: [place];  Service: General;  Laterality: N/A;  . COLONOSCOPY    .  CYSTOCELE REPAIR  2007   sling  . DISTAL INTERPHALANGEAL JOINT FUSION Right 02/04/2014   Procedure: FUSION DISTAL INTERPHALANGEAL JOINT RIGHT INDEX FINGER ;  Surgeon: Daryll Brod, MD;  Location: [place];  Service: Orthopedics;  Laterality: Right;  . EYE SURGERY Bilateral    Cataract surgery with lens implant  . KNEE SURGERY  2009,2011   partial knee  . LAPAROSCOPY N/A 08/24/2018   Procedure: LAPAROSCOPY DIAGNOSTIC;  Surgeon: Fanny Skates, MD;  Location: [place];  Service: General;  Laterality: N/A;  . OPEN REDUCTION INTERNAL FIXATION (ORIF) PROXIMAL PHALANX Left 08/30/2013   Procedure: OPEN REDUCTION INTERNAL FIXATION (ORIF) PROXIMAL PHALANX FRACTURE LEFT SMALL FINGER; SPLINT RING FINGER;  Surgeon: Wynonia Sours, MD;  Location: [place];  Service: Orthopedics;  Laterality: Left;  . POSTERIOR LUMBAR FUSION 4 LEVEL N/A 07/17/2017   Procedure: Thoracic Ten - Lumbar Five revison of hardware with Mazor;  Surgeon: Kristeen Miss, MD;  Location: [place];  Service: Neurosurgery;  Laterality: N/A;  Thoracic/Lumbar  . PTOSIS REPAIR Bilateral 07/27/2015   Procedure: PTOSIS REPAIR;  Surgeon: Cristine Polio, MD;  Location: [place];  Service: Plastics;  Laterality: Bilateral;  . SHOULDER SURGERY     rt rcr,and lt  . TONSILLECTOMY    . TOTAL KNEE ARTHROPLASTY Right 12/23/2019   Procedure: TOTAL KNEE ARTHROPLASTY;  Surgeon: Vickey Huger, MD;  [facility]: [facility];  Service: Orthopedics;  Laterality: Right;    There were no vitals filed for this visit.   Subjective Assessment - 01/16/20 1436    Subjective I have been feeling pretty good, had to do some stairs yesterday, did one at a time, I am hurting a little more today    Currently in Pain? Yes    Pain Score 5     Pain Location Knee    Pain Orientation Right;Medial    Pain Descriptors / Indicators Aching    Aggravating Factors  worse with stairs                             [facility] Adult PT  Treatment/Exercise - 01/16/20 0001      Ambulation/Gait   Gait Comments worked on stairs step over step on 4" up and down, 6" steps one at a time, gait with SPC, and HHA x 100 feet      Knee/Hip Exercises: Aerobic   Recumbent Bike seat #5 x 6 minutes, full revolutions after about a minute    Nustep level 4 7 minutes      Knee/Hip Exercises: Machines for Strengthening   Cybex Knee Extension 5# 2x10    Cybex Knee Flexion 20# 3x10    Cybex Leg Press 20# 2x10      Vasopneumatic   Number Minutes Vasopneumatic  10 minutes    Vasopnuematic Location  Knee    Vasopneumatic Pressure Medium    Vasopneumatic Temperature  35      Manual Therapy   Manual Therapy Passive ROM;Soft tissue mobilization    Soft tissue mobilization gentle to the scar    Passive ROM flexion and extension, gentle distraction as well                    PT Short Term Goals - 01/09/20 1520      PT SHORT TERM GOAL #1   Title independent with initial HEP    Status Achieved             PT Long Term Goals - 01/16/20 1516      PT LONG TERM GOAL #1   Title decrease pain 50%    Status On-going      PT LONG TERM GOAL #2   Title increase AROM of the right knee to 6-115 degrees flexion    Status On-going                 Plan - 01/16/20 1513    Clinical Impression Statement Patient has fear of walking with the cane, the 6" steps are too much for going down step over step.  She is doing great with the ROM, she is still very tender to the scar and the medial proximal scar.    PT Next Visit Plan continue to work on gait    Consulted and Agree with Plan of Care Patient           Patient will benefit from skilled therapeutic intervention in order to improve the following deficits and impairments:  Abnormal gait, Decreased range of motion, Decreased balance, Decreased scar mobility, Impaired flexibility, Increased edema, Decreased strength, Decreased mobility, Decreased endurance, Pain  Visit  Diagnosis: Difficulty in walking, not elsewhere classified  Acute pain of right knee  Localized edema  Stiffness of right knee, not elsewhere classified     Problem List Patient Active Problem List   Diagnosis Date Noted  .  S/P total knee arthroplasty, right 12/23/2019  . Acalculous cholecystitis 08/24/2018  . Osteoarthritis of right knee   . Vertigo   . DDD (degenerative disc disease), cervical   . Benign essential HTN   . History of DVT (deep vein thrombosis)   . Tachycardia   . Steroid-induced hyperglycemia   . Hypothyroidism   . Neuropathic pain   . Acute blood loss anemia   . S/P lumbar fusion   . Closed L5 vertebral fracture ([place]) 08/31/2017  . Closed fracture of fifth lumbar vertebra ([place]) 08/30/2017  . Lumbar vertebral fracture ([place]) 07/17/2017  . Scoliosis 06/13/2017    Sumner Boast., PT 01/16/2020, 3:23 PM  [place]. [place], [place], [address] Phone: [phone]   Fax:  [phone]  Name: [name] MRN: [id number] Date of Birth: [birth year]

## 2020-01-21 ENCOUNTER — Other Ambulatory Visit: Payer: Self-pay

## 2020-01-21 ENCOUNTER — Encounter: Payer: Self-pay | Admitting: Physical Therapy

## 2020-01-21 ENCOUNTER — Ambulatory Visit: Payer: Medicare Other | Admitting: Physical Therapy

## 2020-01-21 DIAGNOSIS — M25561 Pain in right knee: Secondary | ICD-10-CM

## 2020-01-21 DIAGNOSIS — R600 Localized edema: Secondary | ICD-10-CM

## 2020-01-21 DIAGNOSIS — M25661 Stiffness of right knee, not elsewhere classified: Secondary | ICD-10-CM

## 2020-01-21 DIAGNOSIS — R262 Difficulty in walking, not elsewhere classified: Secondary | ICD-10-CM

## 2020-01-21 DIAGNOSIS — M6281 Muscle weakness (generalized): Secondary | ICD-10-CM

## 2020-01-21 NOTE — Therapy (Signed)
[facility]. [place], [place], [address] Phone: [phone]   Fax:  [phone]  Physical Therapy Treatment  Patient Details  Name: [name] MRN: [id number] Date of Birth: 1932-09-08 Referring Provider (PT): Lucey   Encounter Date: 01/21/2020   PT End of Session - 01/21/20 1515    Visit Number 5    Date for PT Re-Evaluation 03/02/20    PT Start Time 1433    PT Stop Time 1530    PT Time Calculation (min) 57 min    Activity Tolerance Patient tolerated treatment well    Behavior During Therapy [facility] for tasks assessed/performed           Past Medical History:  Diagnosis Date  . Acalculous cholecystitis 08/24/2018  . Arthritis   . DVT of lower extremity (deep venous thrombosis) ([place])    right leg - after back surgery in 2019  . GERD (gastroesophageal reflux disease)   . History of blood transfusion   . Hypothyroidism   . PONV (postoperative nausea and vomiting)   . Ptosis of both eyelids   . Vertigo   . Wears glasses    reading    Past Surgical History:  Procedure Laterality Date  . ABDOMINAL HYSTERECTOMY  1986  . APPLICATION OF ROBOTIC ASSISTANCE FOR SPINAL PROCEDURE N/A 07/17/2017   Procedure: APPLICATION OF ROBOTIC ASSISTANCE FOR SPINAL PROCEDURE;  Surgeon: Kristeen Miss, MD;  Location: [place];  Service: Neurosurgery;  Laterality: N/A;  . APPLICATION OF ROBOTIC ASSISTANCE FOR SPINAL PROCEDURE N/A 09/01/2017   Procedure: APPLICATION OF ROBOTIC ASSISTANCE FOR SPINAL PROCEDURE;  Surgeon: Kristeen Miss, MD;  Location: [place];  Service: Neurosurgery;  Laterality: N/A;  . [place]  2000   right  . CARPAL TUNNEL RELEASE  2012   left  . CHOLECYSTECTOMY N/A 08/24/2018   Procedure: LAPAROSCOPIC CHOLECYSTECTOMY WITH INTRAOPERATIVE CHOLANGIOGRAM;  Surgeon: Fanny Skates, MD;  Location: [place];  Service: General;  Laterality: N/A;  . COLONOSCOPY    .  CYSTOCELE REPAIR  2007   sling  . DISTAL INTERPHALANGEAL JOINT FUSION Right 02/04/2014   Procedure: FUSION DISTAL INTERPHALANGEAL JOINT RIGHT INDEX FINGER ;  Surgeon: Daryll Brod, MD;  Location: [place];  Service: Orthopedics;  Laterality: Right;  . EYE SURGERY Bilateral    Cataract surgery with lens implant  . KNEE SURGERY  2009,2011   partial knee  . LAPAROSCOPY N/A 08/24/2018   Procedure: LAPAROSCOPY DIAGNOSTIC;  Surgeon: Fanny Skates, MD;  Location: [place];  Service: General;  Laterality: N/A;  . OPEN REDUCTION INTERNAL FIXATION (ORIF) PROXIMAL PHALANX Left 08/30/2013   Procedure: OPEN REDUCTION INTERNAL FIXATION (ORIF) PROXIMAL PHALANX FRACTURE LEFT SMALL FINGER; SPLINT RING FINGER;  Surgeon: Wynonia Sours, MD;  Location: [place];  Service: Orthopedics;  Laterality: Left;  . POSTERIOR LUMBAR FUSION 4 LEVEL N/A 07/17/2017   Procedure: Thoracic Ten - Lumbar Five revison of hardware with Mazor;  Surgeon: Kristeen Miss, MD;  Location: [place];  Service: Neurosurgery;  Laterality: N/A;  Thoracic/Lumbar  . PTOSIS REPAIR Bilateral 07/27/2015   Procedure: PTOSIS REPAIR;  Surgeon: Cristine Polio, MD;  Location: [place];  Service: Plastics;  Laterality: Bilateral;  . SHOULDER SURGERY     rt rcr,and lt  . TONSILLECTOMY    . TOTAL KNEE ARTHROPLASTY Right 12/23/2019   Procedure: TOTAL KNEE ARTHROPLASTY;  Surgeon: Vickey Huger, MD;  [facility];  Service: Orthopedics;  Laterality: Right;    There were no vitals filed for this visit.   Subjective Assessment - 01/21/20 1444    Subjective My knee is tight    Currently in Pain? Yes    Pain Score 2     Pain Location Knee    Pain Orientation Right    Aggravating Factors  knee just feels tight                             [facility] Adult PT Treatment/Exercise - 01/21/20 0001      Ambulation/Gait   Gait Comments worked on stairs step over step on 4" up and down, 6" steps one at a  time, gait with SPC, and HHA x 100 feet      Knee/Hip Exercises: Aerobic   Recumbent Bike 6 minutes    Nustep level 4 7 minutes      Knee/Hip Exercises: Machines for Strengthening   Cybex Knee Extension 5# 2x10    Cybex Knee Flexion 20# 3x10    Cybex Leg Press 20# 2x10, then no wieght working on ROM      Vasopneumatic   Number Minutes Vasopneumatic  10 minutes    Vasopnuematic Location  Knee    Vasopneumatic Pressure Medium    Vasopneumatic Temperature  35      Manual Therapy   Manual Therapy Passive ROM;Soft tissue mobilization    Soft tissue mobilization gentle to the scar, to the distal ITB    Passive ROM flexion and extension, gentle distraction as well                    PT Short Term Goals - 01/09/20 1520      PT SHORT TERM GOAL #1   Title independent with initial HEP    Status Achieved             PT Long Term Goals - 01/16/20 1516      PT LONG TERM GOAL #1   Title decrease pain 50%    Status On-going      PT LONG TERM GOAL #2   Title increase AROM of the right knee to 6-115 degrees flexion    Status On-going                 Plan - 01/21/20 1515    Clinical Impression Statement Patient seems to have a little more swelling today, the lower leg is shiny and has a lot of pitting edema, negative Homan's,  ROM is still very good, walking with the [facility] she does have a trendelenberg on the right.  She is very tender to the scar and the dital ITB today    PT Next Visit Plan continue to work on gait    Consulted and Agree with Plan of Care Patient           Patient will benefit from skilled therapeutic intervention in order to improve the following deficits and impairments:  Abnormal gait, Decreased range of motion, Decreased balance, Decreased scar mobility, Impaired flexibility, Increased edema, Decreased strength, Decreased mobility, Decreased endurance, Pain  Visit Diagnosis: Difficulty in walking, not elsewhere classified  Acute pain of  right knee  Localized edema  Stiffness of right knee, not elsewhere classified  Muscle weakness (generalized)     Problem List Patient Active Problem List   Diagnosis Date Noted  . S/P total knee arthroplasty, right 12/23/2019  .  Acalculous cholecystitis 08/24/2018  . Osteoarthritis of right knee   . Vertigo   . DDD (degenerative disc disease), cervical   . Benign essential HTN   . History of DVT (deep vein thrombosis)   . Tachycardia   . Steroid-induced hyperglycemia   . Hypothyroidism   . Neuropathic pain   . Acute blood loss anemia   . S/P lumbar fusion   . Closed L5 vertebral fracture ([place]) 08/31/2017  . Closed fracture of fifth lumbar vertebra ([place]) 08/30/2017  . Lumbar vertebral fracture ([place]) 07/17/2017  . Scoliosis 06/13/2017    Sumner Boast., PT 01/21/2020, 3:17 PM  [place]. [place], [place], [address] Phone: [phone]   Fax:  [phone]  Name: [name] MRN: [id number] Date of Birth: 30-Dec-1932

## 2020-01-24 ENCOUNTER — Other Ambulatory Visit: Payer: Self-pay

## 2020-01-24 ENCOUNTER — Ambulatory Visit: Payer: Medicare Other | Admitting: Physical Therapy

## 2020-01-24 ENCOUNTER — Encounter: Payer: Self-pay | Admitting: Physical Therapy

## 2020-01-24 DIAGNOSIS — M25561 Pain in right knee: Secondary | ICD-10-CM

## 2020-01-24 DIAGNOSIS — M25661 Stiffness of right knee, not elsewhere classified: Secondary | ICD-10-CM

## 2020-01-24 DIAGNOSIS — R600 Localized edema: Secondary | ICD-10-CM

## 2020-01-24 DIAGNOSIS — R262 Difficulty in walking, not elsewhere classified: Secondary | ICD-10-CM

## 2020-01-24 DIAGNOSIS — M6281 Muscle weakness (generalized): Secondary | ICD-10-CM

## 2020-01-24 NOTE — Therapy (Signed)
[place]. [place], [place], [address] Phone: [phone]   Fax:  [phone]  Physical Therapy Treatment  Patient Details  Name: [name] MRN: [id number] Date of Birth: 04/10/1932 Referring Provider (PT): Lucey   Encounter Date: 01/24/2020   PT End of Session - 01/24/20 1133    Visit Number 6    Date for PT Re-Evaluation 03/02/20    PT Start Time 4975    PT Stop Time 1145    PT Time Calculation (min) 54 min    Activity Tolerance Patient tolerated treatment well    Behavior During Therapy [facility] for tasks assessed/performed           Past Medical History:  Diagnosis Date  . Acalculous cholecystitis 08/24/2018  . Arthritis   . DVT of lower extremity (deep venous thrombosis) ([place])    right leg - after back surgery in 2019  . GERD (gastroesophageal reflux disease)   . History of blood transfusion   . Hypothyroidism   . PONV (postoperative nausea and vomiting)   . Ptosis of both eyelids   . Vertigo   . Wears glasses    reading    Past Surgical History:  Procedure Laterality Date  . ABDOMINAL HYSTERECTOMY  1986  . APPLICATION OF ROBOTIC ASSISTANCE FOR SPINAL PROCEDURE N/A 07/17/2017   Procedure: APPLICATION OF ROBOTIC ASSISTANCE FOR SPINAL PROCEDURE;  Surgeon: Kristeen Miss, MD;  Location: [place];  Service: Neurosurgery;  Laterality: N/A;  . APPLICATION OF ROBOTIC ASSISTANCE FOR SPINAL PROCEDURE N/A 09/01/2017   Procedure: APPLICATION OF ROBOTIC ASSISTANCE FOR SPINAL PROCEDURE;  Surgeon: Kristeen Miss, MD;  Location: [employer];  Service: Neurosurgery;  Laterality: N/A;  . [place]  2000   right  . CARPAL TUNNEL RELEASE  2012   left  . CHOLECYSTECTOMY N/A 08/24/2018   Procedure: LAPAROSCOPIC CHOLECYSTECTOMY WITH INTRAOPERATIVE CHOLANGIOGRAM;  Surgeon: Fanny Skates, MD;  Location: [place];  Service: General;  Laterality: N/A;  . COLONOSCOPY    .  CYSTOCELE REPAIR  2007   sling  . DISTAL INTERPHALANGEAL JOINT FUSION Right 02/04/2014   Procedure: FUSION DISTAL INTERPHALANGEAL JOINT RIGHT INDEX FINGER ;  Surgeon: Daryll Brod, MD;  Location: [place];  Service: Orthopedics;  Laterality: Right;  . EYE SURGERY Bilateral    Cataract surgery with lens implant  . KNEE SURGERY  2009,2011   partial knee  . LAPAROSCOPY N/A 08/24/2018   Procedure: LAPAROSCOPY DIAGNOSTIC;  Surgeon: Fanny Skates, MD;  Location: [place];  Service: General;  Laterality: N/A;  . OPEN REDUCTION INTERNAL FIXATION (ORIF) PROXIMAL PHALANX Left 08/30/2013   Procedure: OPEN REDUCTION INTERNAL FIXATION (ORIF) PROXIMAL PHALANX FRACTURE LEFT SMALL FINGER; SPLINT RING FINGER;  Surgeon: Wynonia Sours, MD;  Location: [place];  Service: Orthopedics;  Laterality: Left;  . POSTERIOR LUMBAR FUSION 4 LEVEL N/A 07/17/2017   Procedure: Thoracic Ten - Lumbar Five revison of hardware with Mazor;  Surgeon: Kristeen Miss, MD;  Location: [place];  Service: Neurosurgery;  Laterality: N/A;  Thoracic/Lumbar  . PTOSIS REPAIR Bilateral 07/27/2015   Procedure: PTOSIS REPAIR;  Surgeon: Cristine Polio, MD;  Location: [place];  Service: Plastics;  Laterality: Bilateral;  . SHOULDER SURGERY     rt rcr,and lt  . TONSILLECTOMY    . TOTAL KNEE ARTHROPLASTY Right 12/23/2019   Procedure: TOTAL KNEE ARTHROPLASTY;  Surgeon: Vickey Huger, MD;  [facility];  Service: Orthopedics;  Laterality: Right;    There were no vitals filed for this visit.   Subjective Assessment - 01/24/20 1057    Subjective Pateint reports that the right medial knee is very sore, she was able to put on shoes for the first time    Currently in Pain? Yes    Pain Score 3     Pain Location Knee    Pain Orientation Right;Lateral                             [facility] Adult PT Treatment/Exercise - 01/24/20 0001      Ambulation/Gait   Gait Comments stairs 4" and 6" up  and down step over step up      Knee/Hip Exercises: Stretches   Passive Hamstring Stretch Right;4 reps;20 seconds      Knee/Hip Exercises: Aerobic   Recumbent Bike 6 minutes    Nustep level 4 7 minutes      Knee/Hip Exercises: Machines for Strengthening   Cybex Knee Extension 5# 2x10    Cybex Knee Flexion 20# 3x10      Knee/Hip Exercises: Supine   Short Arc Quad Sets Right;3 sets;10 reps    Short Arc Quad Sets Limitations 3#    Bridges with [employer] Both;2 sets;10 reps   caused right HS cramp     Vasopneumatic   Number Minutes Vasopneumatic  10 minutes    Vasopnuematic Location  Knee    Vasopneumatic Pressure Medium    Vasopneumatic Temperature  35      Manual Therapy   Manual Therapy Passive ROM;Soft tissue mobilization    Soft tissue mobilization gentle to the scar, to the distal ITB    Passive ROM flexion and extension, gentle distraction as well                    PT Short Term Goals - 01/09/20 1520      PT SHORT TERM GOAL #1   Title independent with initial HEP    Status Achieved             PT Long Term Goals - 01/24/20 1136      PT LONG TERM GOAL #1   Title decrease pain 50%    Status On-going      PT LONG TERM GOAL #2   Title increase AROM of the right knee to 6-115 degrees flexion    Status Partially Met                 Plan - 01/24/20 1134    Clinical Impression Statement Patient reports that she is having some right medial knee pain, did not want to do the leg press.  When we tried to do the bridging this caused a cramp in the right HS.  ROM is much improved and she was able to put tennis shoes on today instead of houseshoes    PT Next Visit Plan continue to work on gait    Consulted and Agree with Plan of Care Patient           Patient will benefit from skilled therapeutic intervention in order to improve the following deficits and impairments:  Abnormal gait, Decreased range of motion, Decreased balance, Decreased scar  mobility, Impaired flexibility, Increased edema, Decreased strength, Decreased mobility, Decreased endurance, Pain  Visit Diagnosis: Difficulty in walking, not elsewhere classified  Acute pain of right knee  Localized edema  Stiffness of right knee, not elsewhere classified  Muscle weakness (generalized)     Problem List Patient Active Problem List   Diagnosis Date Noted  . S/P total knee arthroplasty, right 12/23/2019  . Acalculous cholecystitis 08/24/2018  . Osteoarthritis of right knee   . Vertigo   . DDD (degenerative disc disease), cervical   . Benign essential HTN   . History of DVT (deep vein thrombosis)   . Tachycardia   . Steroid-induced hyperglycemia   . Hypothyroidism   . Neuropathic pain   . Acute blood loss anemia   . S/P lumbar fusion   . Closed L5 vertebral fracture ([place]) 08/31/2017  . Closed fracture of fifth lumbar vertebra ([place]) 08/30/2017  . Lumbar vertebral fracture ([place]) 07/17/2017  . Scoliosis 06/13/2017    Sumner Boast., PT 01/24/2020, 11:37 AM  [place]. [place], [place], [address] Phone: [phone]   Fax:  [phone]  Name: [name] MRN: [id number] Date of Birth: 12/02/1932

## 2020-01-27 ENCOUNTER — Other Ambulatory Visit: Payer: Self-pay

## 2020-01-27 ENCOUNTER — Encounter: Payer: Self-pay | Admitting: Physical Therapy

## 2020-01-27 ENCOUNTER — Ambulatory Visit: Payer: Medicare Other | Admitting: Physical Therapy

## 2020-01-27 DIAGNOSIS — R262 Difficulty in walking, not elsewhere classified: Secondary | ICD-10-CM

## 2020-01-27 DIAGNOSIS — M25561 Pain in right knee: Secondary | ICD-10-CM | POA: Diagnosis not present

## 2020-01-27 DIAGNOSIS — M6281 Muscle weakness (generalized): Secondary | ICD-10-CM

## 2020-01-27 DIAGNOSIS — R600 Localized edema: Secondary | ICD-10-CM

## 2020-01-27 DIAGNOSIS — M25661 Stiffness of right knee, not elsewhere classified: Secondary | ICD-10-CM

## 2020-01-27 NOTE — Therapy (Signed)
[place]. [place], [place], [address] Phone: [phone]   Fax:  [phone]  Physical Therapy Treatment  Patient Details  Name: [name] MRN: [id number] Date of Birth: Apr 18, 1932 Referring Provider (PT): Lucey   Encounter Date: 01/27/2020   PT End of Session - 01/27/20 1558    Visit Number 7    Date for PT Re-Evaluation 03/02/20    PT Start Time 6415    PT Stop Time 1615    PT Time Calculation (min) 54 min    Activity Tolerance Patient tolerated treatment well    Behavior During Therapy [facility] for tasks assessed/performed           Past Medical History:  Diagnosis Date  . Acalculous cholecystitis 08/24/2018  . Arthritis   . DVT of lower extremity (deep venous thrombosis) ([place])    right leg - after back surgery in 2019  . GERD (gastroesophageal reflux disease)   . History of blood transfusion   . Hypothyroidism   . PONV (postoperative nausea and vomiting)   . Ptosis of both eyelids   . Vertigo   . Wears glasses    reading    Past Surgical History:  Procedure Laterality Date  . ABDOMINAL HYSTERECTOMY  1986  . APPLICATION OF ROBOTIC ASSISTANCE FOR SPINAL PROCEDURE N/A 07/17/2017   Procedure: APPLICATION OF ROBOTIC ASSISTANCE FOR SPINAL PROCEDURE;  Surgeon: Kristeen Miss, MD;  Location: [place];  Service: Neurosurgery;  Laterality: N/A;  . APPLICATION OF ROBOTIC ASSISTANCE FOR SPINAL PROCEDURE N/A 09/01/2017   Procedure: APPLICATION OF ROBOTIC ASSISTANCE FOR SPINAL PROCEDURE;  Surgeon: Kristeen Miss, MD;  Location: [place];  Service: Neurosurgery;  Laterality: N/A;  . [place]  2000   right  . CARPAL TUNNEL RELEASE  2012   left  . CHOLECYSTECTOMY N/A 08/24/2018   Procedure: LAPAROSCOPIC CHOLECYSTECTOMY WITH INTRAOPERATIVE CHOLANGIOGRAM;  Surgeon: Fanny Skates, MD;  Location: [place];  Service: General;  Laterality: N/A;  . COLONOSCOPY    .  CYSTOCELE REPAIR  2007   sling  . DISTAL INTERPHALANGEAL JOINT FUSION Right 02/04/2014   Procedure: FUSION DISTAL INTERPHALANGEAL JOINT RIGHT INDEX FINGER ;  Surgeon: Daryll Brod, MD;  Location: [place];  Service: Orthopedics;  Laterality: Right;  . EYE SURGERY Bilateral    Cataract surgery with lens implant  . KNEE SURGERY  2009,2011   partial knee  . LAPAROSCOPY N/A 08/24/2018   Procedure: LAPAROSCOPY DIAGNOSTIC;  Surgeon: Fanny Skates, MD;  Location: [place];  Service: General;  Laterality: N/A;  . OPEN REDUCTION INTERNAL FIXATION (ORIF) PROXIMAL PHALANX Left 08/30/2013   Procedure: OPEN REDUCTION INTERNAL FIXATION (ORIF) PROXIMAL PHALANX FRACTURE LEFT SMALL FINGER; SPLINT RING FINGER;  Surgeon: Wynonia Sours, MD;  Location: [place];  Service: Orthopedics;  Laterality: Left;  . POSTERIOR LUMBAR FUSION 4 LEVEL N/A 07/17/2017   Procedure: Thoracic Ten - Lumbar Five revison of hardware with Mazor;  Surgeon: Kristeen Miss, MD;  Location: [place];  Service: Neurosurgery;  Laterality: N/A;  Thoracic/Lumbar  . PTOSIS REPAIR Bilateral 07/27/2015   Procedure: PTOSIS REPAIR;  Surgeon: Cristine Polio, MD;  Location: [place];  Service: Plastics;  Laterality: Bilateral;  . SHOULDER SURGERY     rt rcr,and lt  . TONSILLECTOMY    . TOTAL KNEE ARTHROPLASTY Right 12/23/2019   Procedure: TOTAL KNEE ARTHROPLASTY;  Surgeon: Vickey Huger, MD;  [facility];  Service: Orthopedics;  Laterality: Right;    There were no vitals filed for this visit.   Subjective Assessment - 01/27/20 1526    Subjective Patient reports that on Saturday she went to the grocery store and then went to [facility] [place], she reports that she was in serious pain after that, pain a 10/10    Currently in Pain? Yes    Pain Location Knee    Pain Orientation Right;Lateral    Aggravating Factors  shopping pain up to 10/10 over the weekend                             [facility] Adult  PT Treatment/Exercise - 01/27/20 0001      Knee/Hip Exercises: Aerobic   Recumbent Bike 6 minutes    Nustep level 5 6 minutes      Knee/Hip Exercises: Machines for Strengthening   Cybex Knee Extension 5# 3x10    Cybex Knee Flexion 25# 3x10      Vasopneumatic   Number Minutes Vasopneumatic  10 minutes    Vasopnuematic Location  Knee    Vasopneumatic Pressure Medium    Vasopneumatic Temperature  35      Manual Therapy   Manual Therapy Passive ROM;Soft tissue mobilization    Soft tissue mobilization gentle to the scar, to the distal ITB    Passive ROM flexion and extension, gentle distraction as well                    PT Short Term Goals - 01/09/20 1520      PT SHORT TERM GOAL #1   Title independent with initial HEP    Status Achieved             PT Long Term Goals - 01/24/20 1136      PT LONG TERM GOAL #1   Title decrease pain 50%    Status On-going      PT LONG TERM GOAL #2   Title increase AROM of the right knee to 6-115 degrees flexion    Status Partially Met                 Plan - 01/27/20 1616    Clinical Impression Statement Patient reports a lot of incresae of pain with shopping, struggles with walking with a SPC,  Still very sore and tender in the medial and lateral knee    PT Next Visit Plan may write MD note next visit    Consulted and Agree with Plan of Care Patient           Patient will benefit from skilled therapeutic intervention in order to improve the following deficits and impairments:  Abnormal gait, Decreased range of motion, Decreased balance, Decreased scar mobility, Impaired flexibility, Increased edema, Decreased strength, Decreased mobility, Decreased endurance, Pain  Visit Diagnosis: Difficulty in walking, not elsewhere classified  Acute pain of right knee  Localized edema  Stiffness of right knee, not elsewhere classified  Muscle weakness (generalized)     Problem List Patient Active Problem List    Diagnosis Date Noted  . S/P total knee arthroplasty, right 12/23/2019  . Acalculous cholecystitis 08/24/2018  . Osteoarthritis of right knee   . Vertigo   . DDD (degenerative disc disease), cervical   . Benign essential HTN   . History of DVT (deep vein thrombosis)   . Tachycardia   . Steroid-induced hyperglycemia   . Hypothyroidism   .  Neuropathic pain   . Acute blood loss anemia   . S/P lumbar fusion   . Closed L5 vertebral fracture ([place]) 08/31/2017  . Closed fracture of fifth lumbar vertebra ([place]) 08/30/2017  . Lumbar vertebral fracture ([place]) 07/17/2017  . Scoliosis 06/13/2017    Sumner Boast., PT 01/27/2020, 4:17 PM  [place]. [place], [place], [address] Phone: [phone]   Fax:  [phone]  Name: [name] MRN: [id number] Date of Birth: 01/27/1933

## 2020-01-29 ENCOUNTER — Other Ambulatory Visit: Payer: Self-pay

## 2020-01-29 ENCOUNTER — Encounter: Payer: Self-pay | Admitting: Physical Therapy

## 2020-01-29 ENCOUNTER — Ambulatory Visit: Payer: Medicare Other | Admitting: Physical Therapy

## 2020-01-29 DIAGNOSIS — R262 Difficulty in walking, not elsewhere classified: Secondary | ICD-10-CM

## 2020-01-29 DIAGNOSIS — M25661 Stiffness of right knee, not elsewhere classified: Secondary | ICD-10-CM

## 2020-01-29 DIAGNOSIS — M25561 Pain in right knee: Secondary | ICD-10-CM | POA: Diagnosis not present

## 2020-01-29 DIAGNOSIS — M6281 Muscle weakness (generalized): Secondary | ICD-10-CM

## 2020-01-29 DIAGNOSIS — R600 Localized edema: Secondary | ICD-10-CM

## 2020-01-29 NOTE — Therapy (Signed)
[place]. [place], [place], [address] Phone: [phone]   Fax:  [phone]  Physical Therapy Treatment  Patient Details  Name: [name] MRN: [id number] Date of Birth: [birth year] Referring Provider (PT): Lucey   Encounter Date: 01/29/2020   PT End of Session - 01/29/20 9833    Visit Number 8    Date for PT Re-Evaluation 03/02/20    PT Start Time 1601    PT Stop Time 1655    PT Time Calculation (min) 54 min    Activity Tolerance Patient tolerated treatment well    Behavior During Therapy [facility] for tasks assessed/performed           Past Medical History:  Diagnosis Date  . Acalculous cholecystitis 08/24/2018  . Arthritis   . DVT of lower extremity (deep venous thrombosis) ([place])    right leg - after back surgery in 2019  . GERD (gastroesophageal reflux disease)   . History of blood transfusion   . Hypothyroidism   . PONV (postoperative nausea and vomiting)   . Ptosis of both eyelids   . Vertigo   . Wears glasses    reading    Past Surgical History:  Procedure Laterality Date  . ABDOMINAL HYSTERECTOMY  1986  . APPLICATION OF ROBOTIC ASSISTANCE FOR SPINAL PROCEDURE N/A 07/17/2017   Procedure: APPLICATION OF ROBOTIC ASSISTANCE FOR SPINAL PROCEDURE;  Surgeon: Kristeen Miss, MD;  Location: [place];  Service: Neurosurgery;  Laterality: N/A;  . APPLICATION OF ROBOTIC ASSISTANCE FOR SPINAL PROCEDURE N/A 09/01/2017   Procedure: APPLICATION OF ROBOTIC ASSISTANCE FOR SPINAL PROCEDURE;  Surgeon: Kristeen Miss, MD;  Location: [place];  Service: Neurosurgery;  Laterality: N/A;  . [place]  2000   right  . CARPAL TUNNEL RELEASE  2012   left  . CHOLECYSTECTOMY N/A 08/24/2018   Procedure: LAPAROSCOPIC CHOLECYSTECTOMY WITH INTRAOPERATIVE CHOLANGIOGRAM;  Surgeon: Fanny Skates, MD;  Location: [place];  Service: General;  Laterality: N/A;  . COLONOSCOPY    .  CYSTOCELE REPAIR  2007   sling  . DISTAL INTERPHALANGEAL JOINT FUSION Right 02/04/2014   Procedure: FUSION DISTAL INTERPHALANGEAL JOINT RIGHT INDEX FINGER ;  Surgeon: Daryll Brod, MD;  Location: [place];  Service: Orthopedics;  Laterality: Right;  . EYE SURGERY Bilateral    Cataract surgery with lens implant  . KNEE SURGERY  2009,2011   partial knee  . LAPAROSCOPY N/A 08/24/2018   Procedure: LAPAROSCOPY DIAGNOSTIC;  Surgeon: Fanny Skates, MD;  Location: [place];  Service: General;  Laterality: N/A;  . OPEN REDUCTION INTERNAL FIXATION (ORIF) PROXIMAL PHALANX Left 08/30/2013   Procedure: OPEN REDUCTION INTERNAL FIXATION (ORIF) PROXIMAL PHALANX FRACTURE LEFT SMALL FINGER; SPLINT RING FINGER;  Surgeon: Wynonia Sours, MD;  Location: [place];  Service: Orthopedics;  Laterality: Left;  . POSTERIOR LUMBAR FUSION 4 LEVEL N/A 07/17/2017   Procedure: Thoracic Ten - Lumbar Five revison of hardware with Mazor;  Surgeon: Kristeen Miss, MD;  Location: [place];  Service: Neurosurgery;  Laterality: N/A;  Thoracic/Lumbar  . PTOSIS REPAIR Bilateral 07/27/2015   Procedure: PTOSIS REPAIR;  Surgeon: Cristine Polio, MD;  Location: [place];  Service: Plastics;  Laterality: Bilateral;  . SHOULDER SURGERY     rt rcr,and lt  . TONSILLECTOMY    . TOTAL KNEE ARTHROPLASTY Right 12/23/2019   Procedure: TOTAL KNEE ARTHROPLASTY;  Surgeon: Vickey Huger, MD;  [facility];  Service: Orthopedics;  Laterality: Right;    There were no vitals filed for this visit.   Subjective Assessment - 01/29/20 1609    Subjective Patient had a lot of pain after shopping on Saturday, reported "real bad ache"    Currently in Pain? Yes    Pain Score 2     Pain Location Knee    Pain Orientation Right    Pain Descriptors / Indicators Aching    Aggravating Factors  shopping    Pain Relieving Factors rest and ice              [facility] PT Assessment - 01/29/20 0001      AROM   Right  Knee Extension 8    Right Knee Flexion 110      PROM   Right Knee Extension 0    Right Knee Flexion 120                         [facility] Adult PT Treatment/Exercise - 01/29/20 0001      Ambulation/Gait   Gait Comments stairs 4" and 6" up and down step over step up      Knee/Hip Exercises: Aerobic   Recumbent Bike 6 minutes    Nustep level 5 6 minutes      Knee/Hip Exercises: Machines for Strengthening   Cybex Knee Extension 5# 3x10    Cybex Knee Flexion 25# 3x10      Knee/Hip Exercises: Standing   Other Standing Knee Exercises tried some hip abuction no weight      Vasopneumatic   Number Minutes Vasopneumatic  10 minutes    Vasopnuematic Location  Knee    Vasopneumatic Pressure Medium    Vasopneumatic Temperature  35      Manual Therapy   Manual Therapy Passive ROM;Soft tissue mobilization    Soft tissue mobilization gentle to the scar, to the distal ITB    Passive ROM flexion and extension, gentle distraction as well                    PT Short Term Goals - 01/09/20 1520      PT SHORT TERM GOAL #1   Title independent with initial HEP    Status Achieved             PT Long Term Goals - 01/29/20 1643      PT LONG TERM GOAL #1   Title decrease pain 50%    Status Partially Met      PT LONG TERM GOAL #2   Title increase AROM of the right knee to 6-115 degrees flexion    Status Partially Met      PT LONG TERM GOAL #3   Title increase LE strength to 4+/5    Status Partially Met      PT LONG TERM GOAL #4   Title go up and down stairs step over step    Status Partially Met      PT LONG TERM GOAL #5   Title decrease TUG to 19 seconds    Status On-going                 Plan - 01/29/20 1640    Clinical Impression Statement Patient overall is doing great, she did have a slight set back this weekend with her doing some increased shopping and had much increase of pain, that seems to have resolved, her ROM is great, her gait  is  with FWW, She does well with this, when we try to go to a cane she does have trouble but this is more than likely due to hip weakness from past back issues.    PT Next Visit Plan will continue to work on strength, ROM and functional gait    Consulted and Agree with Plan of Care Patient           Patient will benefit from skilled therapeutic intervention in order to improve the following deficits and impairments:  Abnormal gait, Decreased range of motion, Decreased balance, Decreased scar mobility, Impaired flexibility, Increased edema, Decreased strength, Decreased mobility, Decreased endurance, Pain  Visit Diagnosis: Difficulty in walking, not elsewhere classified  Acute pain of right knee  Localized edema  Stiffness of right knee, not elsewhere classified  Muscle weakness (generalized)     Problem List Patient Active Problem List   Diagnosis Date Noted  . S/P total knee arthroplasty, right 12/23/2019  . Acalculous cholecystitis 08/24/2018  . Osteoarthritis of right knee   . Vertigo   . DDD (degenerative disc disease), cervical   . Benign essential HTN   . History of DVT (deep vein thrombosis)   . Tachycardia   . Steroid-induced hyperglycemia   . Hypothyroidism   . Neuropathic pain   . Acute blood loss anemia   . S/P lumbar fusion   . Closed L5 vertebral fracture ([place]) 08/31/2017  . Closed fracture of fifth lumbar vertebra ([place]) 08/30/2017  . Lumbar vertebral fracture ([place]) 07/17/2017  . Scoliosis 06/13/2017    Sumner Boast., PT 01/29/2020, 4:43 PM  [place]. [place], [place], [address] Phone: [phone]   Fax:  [phone]  Name: [name] MRN: [id number] Date of Birth: 1933/02/18

## 2020-02-04 ENCOUNTER — Other Ambulatory Visit: Payer: Self-pay

## 2020-02-04 ENCOUNTER — Ambulatory Visit: Payer: Medicare Other | Attending: Orthopedic Surgery | Admitting: Physical Therapy

## 2020-02-04 DIAGNOSIS — R600 Localized edema: Secondary | ICD-10-CM | POA: Diagnosis present

## 2020-02-04 DIAGNOSIS — R262 Difficulty in walking, not elsewhere classified: Secondary | ICD-10-CM | POA: Insufficient documentation

## 2020-02-04 DIAGNOSIS — M25561 Pain in right knee: Secondary | ICD-10-CM | POA: Insufficient documentation

## 2020-02-04 DIAGNOSIS — M6281 Muscle weakness (generalized): Secondary | ICD-10-CM | POA: Insufficient documentation

## 2020-02-04 DIAGNOSIS — M25661 Stiffness of right knee, not elsewhere classified: Secondary | ICD-10-CM | POA: Insufficient documentation

## 2020-02-04 NOTE — Therapy (Signed)
[place]. [place], [place], [address] Phone: [phone]   Fax:  [phone]  Physical Therapy Treatment  Patient Details  Name: [name] MRN: [id number] Date of Birth: [birth year] Referring Provider (PT): Lucey   Encounter Date: 02/04/2020   PT End of Session - 02/04/20 1712    Visit Number 9    Date for PT Re-Evaluation 03/02/20    PT Start Time 1600    PT Stop Time 1655    PT Time Calculation (min) 55 min    Activity Tolerance Patient tolerated treatment well    Behavior During Therapy [facility] for tasks assessed/performed           Past Medical History:  Diagnosis Date  . Acalculous cholecystitis 08/24/2018  . Arthritis   . DVT of lower extremity (deep venous thrombosis) ([place])    right leg - after back surgery in 2019  . GERD (gastroesophageal reflux disease)   . History of blood transfusion   . Hypothyroidism   . PONV (postoperative nausea and vomiting)   . Ptosis of both eyelids   . Vertigo   . Wears glasses    reading    Past Surgical History:  Procedure Laterality Date  . ABDOMINAL HYSTERECTOMY  1986  . APPLICATION OF ROBOTIC ASSISTANCE FOR SPINAL PROCEDURE N/A 07/17/2017   Procedure: APPLICATION OF ROBOTIC ASSISTANCE FOR SPINAL PROCEDURE;  Surgeon: Kristeen Miss, MD;  Location: [place];  Service: Neurosurgery;  Laterality: N/A;  . APPLICATION OF ROBOTIC ASSISTANCE FOR SPINAL PROCEDURE N/A 09/01/2017   Procedure: APPLICATION OF ROBOTIC ASSISTANCE FOR SPINAL PROCEDURE;  Surgeon: Kristeen Miss, MD;  Location: [place];  Service: Neurosurgery;  Laterality: N/A;  . [place]  2000   right  . CARPAL TUNNEL RELEASE  2012   left  . CHOLECYSTECTOMY N/A 08/24/2018   Procedure: LAPAROSCOPIC CHOLECYSTECTOMY WITH INTRAOPERATIVE CHOLANGIOGRAM;  Surgeon: Fanny Skates, MD;  Location: [place];  Service: General;  Laterality: N/A;  . COLONOSCOPY    .  CYSTOCELE REPAIR  2007   sling  . DISTAL INTERPHALANGEAL JOINT FUSION Right 02/04/2014   Procedure: FUSION DISTAL INTERPHALANGEAL JOINT RIGHT INDEX FINGER ;  Surgeon: Daryll Brod, MD;  Location: [place];  Service: Orthopedics;  Laterality: Right;  . EYE SURGERY Bilateral    Cataract surgery with lens implant  . KNEE SURGERY  2009,2011   partial knee  . LAPAROSCOPY N/A 08/24/2018   Procedure: LAPAROSCOPY DIAGNOSTIC;  Surgeon: Fanny Skates, MD;  Location: [place];  Service: General;  Laterality: N/A;  . OPEN REDUCTION INTERNAL FIXATION (ORIF) PROXIMAL PHALANX Left 08/30/2013   Procedure: OPEN REDUCTION INTERNAL FIXATION (ORIF) PROXIMAL PHALANX FRACTURE LEFT SMALL FINGER; SPLINT RING FINGER;  Surgeon: Wynonia Sours, MD;  Location: [place];  Service: Orthopedics;  Laterality: Left;  . POSTERIOR LUMBAR FUSION 4 LEVEL N/A 07/17/2017   Procedure: Thoracic Ten - Lumbar Five revison of hardware with Mazor;  Surgeon: Kristeen Miss, MD;  Location: [place];  Service: Neurosurgery;  Laterality: N/A;  Thoracic/Lumbar  . PTOSIS REPAIR Bilateral 07/27/2015   Procedure: PTOSIS REPAIR;  Surgeon: Cristine Polio, MD;  Location: [place];  Service: Plastics;  Laterality: Bilateral;  . SHOULDER SURGERY     rt rcr,and lt  . TONSILLECTOMY    . TOTAL KNEE ARTHROPLASTY Right 12/23/2019   Procedure: TOTAL KNEE ARTHROPLASTY;  Surgeon: Vickey Huger, MD;  [facility];  Service: Orthopedics;  Laterality: Right;    There were no vitals filed for this visit.   Subjective Assessment - 02/04/20 1602    Subjective stood up too much today cooking, back and knee both bothering me    Currently in Pain? Yes    Pain Score 6     Pain Orientation Right                             [facility] Adult PT Treatment/Exercise - 02/04/20 0001      Ambulation/Gait   Gait Comments stairs 4" and 6" up and down step over step up, gait with SPC in the back brace x 150 feet,  she had much less trunk sway with the brace      Knee/Hip Exercises: Aerobic   Recumbent Bike 6 minutes    Nustep level 5 6 minutes      Knee/Hip Exercises: Machines for Strengthening   Cybex Knee Extension 10# 3x10    Cybex Knee Flexion 25# 3x10      Knee/Hip Exercises: Standing   Other Standing Knee Exercises on airex standing and trying to reach and have some head turns for balance, this was very difficult for her.      Vasopneumatic   Number Minutes Vasopneumatic  10 minutes    Vasopnuematic Location  Knee    Vasopneumatic Pressure Medium    Vasopneumatic Temperature  35                    PT Short Term Goals - 01/09/20 1520      PT SHORT TERM GOAL #1   Title independent with initial HEP    Status Achieved             PT Long Term Goals - 01/29/20 1643      PT LONG TERM GOAL #1   Title decrease pain 50%    Status Partially Met      PT LONG TERM GOAL #2   Title increase AROM of the right knee to 6-115 degrees flexion    Status Partially Met      PT LONG TERM GOAL #3   Title increase LE strength to 4+/5    Status Partially Met      PT LONG TERM GOAL #4   Title go up and down stairs step over step    Status Partially Met      PT LONG TERM GOAL #5   Title decrease TUG to 19 seconds    Status On-going                 Plan - 02/04/20 1713    Clinical Impression Statement ROM remains good, she has issues with balance especially on uneven terrain and with walking with SPC, occasionally needs CGA to right balance.  with SPC she tends to have significant trunk lean when she tires and she was cooking a lot today.  The back brace seemed to help her tolerance to walking with the [facility].    PT Next Visit Plan will continue to work on strength, ROM and functional gait    Consulted and Agree with Plan of Care Patient           Patient will benefit from skilled therapeutic intervention in order to improve the following deficits and impairments:   Abnormal gait, Decreased range of motion, Decreased balance, Decreased scar mobility, Impaired flexibility, Increased edema, Decreased strength,  Decreased mobility, Decreased endurance, Pain  Visit Diagnosis: Difficulty in walking, not elsewhere classified  Acute pain of right knee  Localized edema  Stiffness of right knee, not elsewhere classified  Muscle weakness (generalized)     Problem List Patient Active Problem List   Diagnosis Date Noted  . S/P total knee arthroplasty, right 12/23/2019  . Acalculous cholecystitis 08/24/2018  . Osteoarthritis of right knee   . Vertigo   . DDD (degenerative disc disease), cervical   . Benign essential HTN   . History of DVT (deep vein thrombosis)   . Tachycardia   . Steroid-induced hyperglycemia   . Hypothyroidism   . Neuropathic pain   . Acute blood loss anemia   . S/P lumbar fusion   . Closed L5 vertebral fracture ([place]) 08/31/2017  . Closed fracture of fifth lumbar vertebra ([place]) 08/30/2017  . Lumbar vertebral fracture ([place]) 07/17/2017  . Scoliosis 06/13/2017    Sumner Boast., PT 02/04/2020, 5:17 PM  [place]. [place], [place], [address] Phone: [phone]   Fax:  [phone]  Name: [name] MRN: [id number] Date of Birth: January 04, 1933

## 2020-02-06 ENCOUNTER — Encounter: Payer: Self-pay | Admitting: Physical Therapy

## 2020-02-06 ENCOUNTER — Other Ambulatory Visit: Payer: Self-pay

## 2020-02-06 ENCOUNTER — Ambulatory Visit: Payer: Medicare Other | Admitting: Physical Therapy

## 2020-02-06 DIAGNOSIS — M25661 Stiffness of right knee, not elsewhere classified: Secondary | ICD-10-CM

## 2020-02-06 DIAGNOSIS — M25561 Pain in right knee: Secondary | ICD-10-CM

## 2020-02-06 DIAGNOSIS — M6281 Muscle weakness (generalized): Secondary | ICD-10-CM

## 2020-02-06 DIAGNOSIS — R600 Localized edema: Secondary | ICD-10-CM

## 2020-02-06 DIAGNOSIS — R262 Difficulty in walking, not elsewhere classified: Secondary | ICD-10-CM

## 2020-02-06 NOTE — Therapy (Signed)
[place], [place], [address] Phone: [phone]   Fax:  [phone] Progress Note Reporting Period 01/01/20 to 02/06/20 for the first 10 visits See note below for Objective Data and Assessment of Progress/Goals.      Physical Therapy Treatment  Patient Details  Name: [name] MRN: [id number] Date of Birth: 1932-05-17 Referring Provider (PT): Lucey   Encounter Date: 02/06/2020   PT End of Session - 02/06/20 1649    Visit Number 10    Date for PT Re-Evaluation 03/02/20    PT Start Time 9485    PT Stop Time 1702    PT Time Calculation (min) 57 min    Activity Tolerance Patient tolerated treatment well    Behavior During Therapy [facility] for tasks assessed/performed           Past Medical History:  Diagnosis Date  . Acalculous cholecystitis 08/24/2018  . Arthritis   . DVT of lower extremity (deep venous thrombosis) ([place])    right leg - after back surgery in 2019  . GERD (gastroesophageal reflux disease)   . History of blood transfusion   . Hypothyroidism   . PONV (postoperative nausea and vomiting)   . Ptosis of both eyelids   . Vertigo   . Wears glasses    reading    Past Surgical History:  Procedure Laterality Date  . ABDOMINAL HYSTERECTOMY  1986  . APPLICATION OF ROBOTIC ASSISTANCE FOR SPINAL PROCEDURE N/A 07/17/2017   Procedure: APPLICATION OF ROBOTIC ASSISTANCE FOR SPINAL PROCEDURE;  Surgeon: Kristeen Miss, MD;  Location: [place];  Service: Neurosurgery;  Laterality: N/A;  . APPLICATION OF ROBOTIC ASSISTANCE FOR SPINAL PROCEDURE N/A 09/01/2017   Procedure: APPLICATION OF ROBOTIC ASSISTANCE FOR SPINAL PROCEDURE;  Surgeon: Kristeen Miss, MD;  Location: [place];  Service: Neurosurgery;  Laterality: N/A;  . [place]  2000   right  . CARPAL TUNNEL RELEASE  2012   left  . CHOLECYSTECTOMY N/A 08/24/2018   Procedure: LAPAROSCOPIC  CHOLECYSTECTOMY WITH INTRAOPERATIVE CHOLANGIOGRAM;  Surgeon: Fanny Skates, MD;  Location: [place];  Service: General;  Laterality: N/A;  . COLONOSCOPY    . CYSTOCELE REPAIR  2007   sling  . DISTAL INTERPHALANGEAL JOINT FUSION Right 02/04/2014   Procedure: FUSION DISTAL INTERPHALANGEAL JOINT RIGHT INDEX FINGER ;  Surgeon: Daryll Brod, MD;  Location: [place];  Service: Orthopedics;  Laterality: Right;  . EYE SURGERY Bilateral    Cataract surgery with lens implant  . KNEE SURGERY  2009,2011   partial knee  . LAPAROSCOPY N/A 08/24/2018   Procedure: LAPAROSCOPY DIAGNOSTIC;  Surgeon: Fanny Skates, MD;  Location: [place];  Service: General;  Laterality: N/A;  . OPEN REDUCTION INTERNAL FIXATION (ORIF) PROXIMAL PHALANX Left 08/30/2013   Procedure: OPEN REDUCTION INTERNAL FIXATION (ORIF) PROXIMAL PHALANX FRACTURE LEFT SMALL FINGER; SPLINT RING FINGER;  Surgeon: Wynonia Sours, MD;  Location: [place];  Service: Orthopedics;  Laterality: Left;  . POSTERIOR LUMBAR FUSION 4 LEVEL N/A 07/17/2017   Procedure: Thoracic Ten - Lumbar Five revison of hardware with Mazor;  Surgeon: Kristeen Miss, MD;  Location: [place];  Service: Neurosurgery;  Laterality: N/A;  Thoracic/Lumbar  . PTOSIS REPAIR Bilateral 07/27/2015   Procedure: PTOSIS REPAIR;  Surgeon: Cristine Polio, MD;  Location: [place];  Service: Plastics;  Laterality: Bilateral;  . SHOULDER SURGERY     rt rcr,and lt  .  TONSILLECTOMY    . TOTAL KNEE ARTHROPLASTY Right 12/23/2019   Procedure: TOTAL KNEE ARTHROPLASTY;  Surgeon: Vickey Huger, MD;  Location: [facility];  Service: Orthopedics;  Laterality: Right;    There were no vitals filed for this visit.   Subjective Assessment - 02/06/20 1610    Subjective c/o back pain, reports "mattress issues"    Currently in Pain? Yes    Pain Score 4     Pain Location Back    Pain Orientation Mid;Lower    Aggravating Factors  reports that her knee buckeled today, cold  weather              [facility] PT Assessment - 02/06/20 0001      AROM   Right Knee Extension 8    Right Knee Flexion 112      Timed Up and Go Test   Normal TUG (seconds) 15                         [facility] Adult PT Treatment/Exercise - 02/06/20 0001      Ambulation/Gait   Gait Comments gait with SPC and use of back brace x 150' one instance of LOB, grabbing wall to right self, 4" and 6" stairs step over step up and down      High Level Balance   High Level Balance Comments on airex marchig, tended to fall backward needing assist to right.  Standing on airex 5# rows and shoulder extension to work on core and balance. stnading ball toss, tried with her on the airex this was very difficult for her needing CGA      Knee/Hip Exercises: Stretches   Gastroc Stretch Both;3 reps;20 seconds      Knee/Hip Exercises: Aerobic   Recumbent Bike 6 minutes    Nustep level 5 6 minutes      Knee/Hip Exercises: Standing   Hip Abduction Both;2 sets;10 reps    Abduction Limitations 2.5#      Vasopneumatic   Number Minutes Vasopneumatic  10 minutes    Vasopnuematic Location  Knee    Vasopneumatic Pressure Medium    Vasopneumatic Temperature  35      Manual Therapy   Manual Therapy Passive ROM;Soft tissue mobilization    Soft tissue mobilization gentle to the scar, to the distal ITB    Passive ROM flexion and extension, gentle distraction as well                    PT Short Term Goals - 01/09/20 1520      PT SHORT TERM GOAL #1   Title independent with initial HEP    Status Achieved             PT Long Term Goals - 02/06/20 1704      PT LONG TERM GOAL #1   Title decrease pain 50%    Status Partially Met      PT LONG TERM GOAL #2   Title increase AROM of the right knee to 6-115 degrees flexion    Status Partially Met      PT LONG TERM GOAL #3   Title increase LE strength to 4+/5    Status Partially Met      PT LONG TERM GOAL #4   Title go up and  down stairs step over step    Status Partially Met      PT LONG TERM GOAL #5   Title decrease TUG to  19 seconds    Status Achieved                 Plan - 02/06/20 1703    Clinical Impression Statement Patient with back pain today, has a hx of surgery, she thinks it could be the cold weather.  She has issues with balance especially on dynamic surfaces, she had some LOB with walking with a SPC and using a back brace, Without the back brace she gets very unsteady and c/o back pain, her ROM and her TUG are greatly improved    PT Next Visit Plan will continue to work on strength, ROM and functional gait    Consulted and Agree with Plan of Care Patient           Patient will benefit from skilled therapeutic intervention in order to improve the following deficits and impairments:  Abnormal gait, Decreased range of motion, Decreased balance, Decreased scar mobility, Impaired flexibility, Increased edema, Decreased strength, Decreased mobility, Decreased endurance, Pain  Visit Diagnosis: Difficulty in walking, not elsewhere classified  Acute pain of right knee  Localized edema  Stiffness of right knee, not elsewhere classified  Muscle weakness (generalized)     Problem List Patient Active Problem List   Diagnosis Date Noted  . S/P total knee arthroplasty, right 12/23/2019  . Acalculous cholecystitis 08/24/2018  . Osteoarthritis of right knee   . Vertigo   . DDD (degenerative disc disease), cervical   . Benign essential HTN   . History of DVT (deep vein thrombosis)   . Tachycardia   . Steroid-induced hyperglycemia   . Hypothyroidism   . Neuropathic pain   . Acute blood loss anemia   . S/P lumbar fusion   . Closed L5 vertebral fracture ([place]) 08/31/2017  . Closed fracture of fifth lumbar vertebra ([place]) 08/30/2017  . Lumbar vertebral fracture ([place]) 07/17/2017  . Scoliosis 06/13/2017    Sumner Boast., PT 02/06/2020, 5:06 PM  [place] [place], [place], [address] Phone: [phone]   Fax:  [phone]  Name: [name] MRN: [id number] Date of Birth: [birth year]

## 2020-02-07 ENCOUNTER — Ambulatory Visit: Payer: Medicare Other

## 2020-02-10 ENCOUNTER — Ambulatory Visit (INDEPENDENT_AMBULATORY_CARE_PROVIDER_SITE_OTHER): Payer: Medicare Other | Admitting: Otolaryngology

## 2020-02-11 ENCOUNTER — Encounter: Payer: Self-pay | Admitting: Physical Therapy

## 2020-02-11 ENCOUNTER — Ambulatory Visit: Payer: Medicare Other | Admitting: Physical Therapy

## 2020-02-11 ENCOUNTER — Other Ambulatory Visit: Payer: Self-pay

## 2020-02-11 DIAGNOSIS — M25561 Pain in right knee: Secondary | ICD-10-CM

## 2020-02-11 DIAGNOSIS — R600 Localized edema: Secondary | ICD-10-CM

## 2020-02-11 DIAGNOSIS — R262 Difficulty in walking, not elsewhere classified: Secondary | ICD-10-CM | POA: Diagnosis not present

## 2020-02-11 DIAGNOSIS — M25661 Stiffness of right knee, not elsewhere classified: Secondary | ICD-10-CM

## 2020-02-11 DIAGNOSIS — M6281 Muscle weakness (generalized): Secondary | ICD-10-CM

## 2020-02-11 NOTE — Therapy (Signed)
[place] [place], [place], [address] Phone: [phone]   Fax:  [phone]  Physical Therapy Treatment  Patient Details  Name: Chessa Barrasso MRN: [id number] Date of Birth: 08/13/1932 Referring Provider (PT): Lucey   Encounter Date: 02/11/2020   PT End of Session - 02/11/20 1656    Visit Number 11    Date for PT Re-Evaluation 03/02/20    PT Start Time 1520    PT Stop Time 1605    PT Time Calculation (min) 45 min    Activity Tolerance Patient tolerated treatment well    Behavior During Therapy [facility] for tasks assessed/performed           Past Medical History:  Diagnosis Date  . Acalculous cholecystitis 08/24/2018  . Arthritis   . DVT of lower extremity (deep venous thrombosis) ([place])    right leg - after back surgery in 2019  . GERD (gastroesophageal reflux disease)   . History of blood transfusion   . Hypothyroidism   . PONV (postoperative nausea and vomiting)   . Ptosis of both eyelids   . Vertigo   . Wears glasses    reading    Past Surgical History:  Procedure Laterality Date  . ABDOMINAL HYSTERECTOMY  1986  . APPLICATION OF ROBOTIC ASSISTANCE FOR SPINAL PROCEDURE N/A 07/17/2017   Procedure: APPLICATION OF ROBOTIC ASSISTANCE FOR SPINAL PROCEDURE;  Surgeon: Kristeen Miss, MD;  Location: [place];  Service: Neurosurgery;  Laterality: N/A;  . APPLICATION OF ROBOTIC ASSISTANCE FOR SPINAL PROCEDURE N/A 09/01/2017   Procedure: APPLICATION OF ROBOTIC ASSISTANCE FOR SPINAL PROCEDURE;  Surgeon: Kristeen Miss, MD;  Location: [place];  Service: Neurosurgery;  Laterality: N/A;  . [place]  2000   right  . CARPAL TUNNEL RELEASE  2012   left  . CHOLECYSTECTOMY N/A 08/24/2018   Procedure: LAPAROSCOPIC CHOLECYSTECTOMY WITH INTRAOPERATIVE CHOLANGIOGRAM;  Surgeon: Fanny Skates, MD;  Location: [place];  Service: General;  Laterality: N/A;  . COLONOSCOPY    .  CYSTOCELE REPAIR  2007   sling  . DISTAL INTERPHALANGEAL JOINT FUSION Right 02/04/2014   Procedure: FUSION DISTAL INTERPHALANGEAL JOINT RIGHT INDEX FINGER ;  Surgeon: Daryll Brod, MD;  Location: [place];  Service: Orthopedics;  Laterality: Right;  . EYE SURGERY Bilateral    Cataract surgery with lens implant  . KNEE SURGERY  2009,2011   partial knee  . LAPAROSCOPY N/A 08/24/2018   Procedure: LAPAROSCOPY DIAGNOSTIC;  Surgeon: Fanny Skates, MD;  Location: [place];  Service: General;  Laterality: N/A;  . OPEN REDUCTION INTERNAL FIXATION (ORIF) PROXIMAL PHALANX Left 08/30/2013   Procedure: OPEN REDUCTION INTERNAL FIXATION (ORIF) PROXIMAL PHALANX FRACTURE LEFT SMALL FINGER; SPLINT RING FINGER;  Surgeon: Wynonia Sours, MD;  Location: [place];  Service: Orthopedics;  Laterality: Left;  . POSTERIOR LUMBAR FUSION 4 LEVEL N/A 07/17/2017   Procedure: Thoracic Ten - Lumbar Five revison of hardware with Mazor;  Surgeon: Kristeen Miss, MD;  Location: [place];  Service: Neurosurgery;  Laterality: N/A;  Thoracic/Lumbar  . PTOSIS REPAIR Bilateral 07/27/2015   Procedure: PTOSIS REPAIR;  Surgeon: Cristine Polio, MD;  Location: [place];  Service: Plastics;  Laterality: Bilateral;  . SHOULDER SURGERY     rt rcr,and lt  . TONSILLECTOMY    . TOTAL KNEE ARTHROPLASTY Right 12/23/2019   Procedure: TOTAL KNEE ARTHROPLASTY;  Surgeon: Vickey Huger, MD;  [facility];  Service: Orthopedics;  Laterality: Right;    There were no vitals filed for this visit.   Subjective Assessment - 02/11/20 1525    Subjective REports that the knee is sore and she is very tired today.  Reports that she tried to walk in the home today wtihout the brace    Currently in Pain? Yes    Pain Score 2     Pain Location Knee    Aggravating Factors  standing and walking              [facility] PT Assessment - 02/11/20 0001      PROM   Right Knee Flexion 125                          [facility] Adult PT Treatment/Exercise - 02/11/20 0001      Ambulation/Gait   Gait Comments gait with 2 SPC's x 180 feet, she did much better with this demonstrating better posture and less limp, she did start to fatigue and have some back pain.  Able to do 4" step easily step over step,  the 6" steps she struggles going down, tends to turn her  body      High Level Balance   High Level Balance Comments on airex ball tossing, on airex tband two ways      Knee/Hip Exercises: Aerobic   Recumbent Bike 6 minutes    Nustep level 5 7 minutes      Knee/Hip Exercises: Machines for Strengthening   Cybex Knee Extension 10# 3x10    Cybex Knee Flexion 25# 3x10      Knee/Hip Exercises: Standing   Hip Abduction Both;2 sets;10 reps    Abduction Limitations 2.5#    Hip Extension Both;2 sets;10 reps    Extension Limitations 2.5#, a lot of tactile cues to decrease the forward trunk lean and get better glute activation      Knee/Hip Exercises: Seated   Ball Squeeze 20      Manual Therapy   Manual Therapy Passive ROM;Soft tissue mobilization    Soft tissue mobilization gentle to the scar, to the distal ITB    Passive ROM flexion                    PT Short Term Goals - 01/09/20 1520      PT SHORT TERM GOAL #1   Title independent with initial HEP    Status Achieved             PT Long Term Goals - 02/06/20 1704      PT LONG TERM GOAL #1   Title decrease pain 50%    Status Partially Met      PT LONG TERM GOAL #2   Title increase AROM of the right knee to 6-115 degrees flexion    Status Partially Met      PT LONG TERM GOAL #3   Title increase LE strength to 4+/5    Status Partially Met      PT LONG TERM GOAL #4   Title go up and down stairs step over step    Status Partially Met      PT LONG TERM GOAL #5   Title decrease TUG to 19 seconds    Status Achieved                 Plan - 02/11/20 1657    Clinical Impression Statement  Patient still  with some residual back pain, she did better today but reports that she was tired.  She walked well using two SPC's with better posture and less trendelenberg.  She has great ROM.  Seems like we may have some issues with the past nerve damage from the back as the hip seems really weak with abduction and extension needing cues verbal and tactile to get the correct motions    PT Next Visit Plan will continue to work on strength, ROM and functional gait    Consulted and Agree with Plan of Care Patient           Patient will benefit from skilled therapeutic intervention in order to improve the following deficits and impairments:  Abnormal gait, Decreased range of motion, Decreased balance, Decreased scar mobility, Impaired flexibility, Increased edema, Decreased strength, Decreased mobility, Decreased endurance, Pain  Visit Diagnosis: Difficulty in walking, not elsewhere classified  Acute pain of right knee  Localized edema  Stiffness of right knee, not elsewhere classified  Muscle weakness (generalized)     Problem List Patient Active Problem List   Diagnosis Date Noted  . S/P total knee arthroplasty, right 12/23/2019  . Acalculous cholecystitis 08/24/2018  . Osteoarthritis of right knee   . Vertigo   . DDD (degenerative disc disease), cervical   . Benign essential HTN   . History of DVT (deep vein thrombosis)   . Tachycardia   . Steroid-induced hyperglycemia   . Hypothyroidism   . Neuropathic pain   . Acute blood loss anemia   . S/P lumbar fusion   . Closed L5 vertebral fracture ([place]) 08/31/2017  . Closed fracture of fifth lumbar vertebra ([place]) 08/30/2017  . Lumbar vertebral fracture ([place]) 07/17/2017  . Scoliosis 06/13/2017    Sumner Boast., PT 02/11/2020, 5:00 PM  [place]. [place], [place], [address] Phone: [phone]   Fax:  [phone]  Name: Keonia Pasko MRN:  [id number] Date of Birth: [birth year]

## 2020-02-13 ENCOUNTER — Encounter: Payer: Self-pay | Admitting: Physical Therapy

## 2020-02-13 ENCOUNTER — Other Ambulatory Visit: Payer: Self-pay

## 2020-02-13 ENCOUNTER — Ambulatory Visit: Payer: Medicare Other | Admitting: Physical Therapy

## 2020-02-13 DIAGNOSIS — R262 Difficulty in walking, not elsewhere classified: Secondary | ICD-10-CM | POA: Diagnosis not present

## 2020-02-13 DIAGNOSIS — R600 Localized edema: Secondary | ICD-10-CM

## 2020-02-13 DIAGNOSIS — M25561 Pain in right knee: Secondary | ICD-10-CM

## 2020-02-13 DIAGNOSIS — M6281 Muscle weakness (generalized): Secondary | ICD-10-CM

## 2020-02-13 DIAGNOSIS — M25661 Stiffness of right knee, not elsewhere classified: Secondary | ICD-10-CM

## 2020-02-13 NOTE — Therapy (Signed)
[place] [place], [place], [address] Phone: [phone]   Fax:  [phone]  Physical Therapy Treatment  Patient Details  Name: [name] MRN: [id number] Date of Birth: May 23, 1932 Referring Provider (PT): Lucey   Encounter Date: 02/13/2020   PT End of Session - 02/13/20 1646    Visit Number 12    Date for PT Re-Evaluation 03/02/20    PT Start Time 1600    PT Stop Time 1645    PT Time Calculation (min) 45 min    Activity Tolerance Patient tolerated treatment well    Behavior During Therapy [facility] for tasks assessed/performed           Past Medical History:  Diagnosis Date  . Acalculous cholecystitis 08/24/2018  . Arthritis   . DVT of lower extremity (deep venous thrombosis) ([place])    right leg - after back surgery in 2019  . GERD (gastroesophageal reflux disease)   . History of blood transfusion   . Hypothyroidism   . PONV (postoperative nausea and vomiting)   . Ptosis of both eyelids   . Vertigo   . Wears glasses    reading    Past Surgical History:  Procedure Laterality Date  . ABDOMINAL HYSTERECTOMY  1986  . APPLICATION OF ROBOTIC ASSISTANCE FOR SPINAL PROCEDURE N/A 07/17/2017   Procedure: APPLICATION OF ROBOTIC ASSISTANCE FOR SPINAL PROCEDURE;  Surgeon: Kristeen Miss, MD;  Location: [place];  Service: Neurosurgery;  Laterality: N/A;  . APPLICATION OF ROBOTIC ASSISTANCE FOR SPINAL PROCEDURE N/A 09/01/2017   Procedure: APPLICATION OF ROBOTIC ASSISTANCE FOR SPINAL PROCEDURE;  Surgeon: Kristeen Miss, MD;  Location: [place];  Service: Neurosurgery;  Laterality: N/A;  . [place]  2000   right  . CARPAL TUNNEL RELEASE  2012   left  . CHOLECYSTECTOMY N/A 08/24/2018   Procedure: LAPAROSCOPIC CHOLECYSTECTOMY WITH INTRAOPERATIVE CHOLANGIOGRAM;  Surgeon: Fanny Skates, MD;  Location: [place];  Service: General;  Laterality: N/A;  . COLONOSCOPY    .  CYSTOCELE REPAIR  2007   sling  . DISTAL INTERPHALANGEAL JOINT FUSION Right 02/04/2014   Procedure: FUSION DISTAL INTERPHALANGEAL JOINT RIGHT INDEX FINGER ;  Surgeon: Daryll Brod, MD;  Location: [place];  Service: Orthopedics;  Laterality: Right;  . EYE SURGERY Bilateral    Cataract surgery with lens implant  . KNEE SURGERY  2009,2011   partial knee  . LAPAROSCOPY N/A 08/24/2018   Procedure: LAPAROSCOPY DIAGNOSTIC;  Surgeon: Fanny Skates, MD;  Location: [place];  Service: General;  Laterality: N/A;  . OPEN REDUCTION INTERNAL FIXATION (ORIF) PROXIMAL PHALANX Left 08/30/2013   Procedure: OPEN REDUCTION INTERNAL FIXATION (ORIF) PROXIMAL PHALANX FRACTURE LEFT SMALL FINGER; SPLINT RING FINGER;  Surgeon: Wynonia Sours, MD;  Location: [place];  Service: Orthopedics;  Laterality: Left;  . POSTERIOR LUMBAR FUSION 4 LEVEL N/A 07/17/2017   Procedure: Thoracic Ten - Lumbar Five revison of hardware with Mazor;  Surgeon: Kristeen Miss, MD;  Location: [place];  Service: Neurosurgery;  Laterality: N/A;  Thoracic/Lumbar  . PTOSIS REPAIR Bilateral 07/27/2015   Procedure: PTOSIS REPAIR;  Surgeon: Cristine Polio, MD;  Location: [place];  Service: Plastics;  Laterality: Bilateral;  . SHOULDER SURGERY     rt rcr,and lt  . TONSILLECTOMY    . TOTAL KNEE ARTHROPLASTY Right 12/23/2019   Procedure: TOTAL KNEE ARTHROPLASTY;  Surgeon: Vickey Huger, MD;  [facility];  Service: Orthopedics;  Laterality: Right;    There were no vitals filed for this visit.   Subjective Assessment - 02/13/20 1610    Subjective Tired and when I am tired my back hurts more    Currently in Pain? Yes    Pain Score 2     Pain Location Back                             [facility] Adult PT Treatment/Exercise - 02/13/20 0001      Ambulation/Gait   Gait Comments gait with 2 SPC 150 ' x 4      High Level Balance   High Level Balance Comments on airex ball tossing, on airex  tband two ways, in parallel bars walking on airex balance beam      Knee/Hip Exercises: Aerobic   Recumbent Bike 6 minutes    Nustep level 5 7 minutes      Knee/Hip Exercises: Machines for Strengthening   Cybex Knee Extension 10# 3x10    Cybex Knee Flexion 25# 3x10      Knee/Hip Exercises: Seated   Ball Squeeze 20                    PT Short Term Goals - 01/09/20 1520      PT SHORT TERM GOAL #1   Title independent with initial HEP    Status Achieved             PT Long Term Goals - 02/13/20 1649      PT LONG TERM GOAL #1   Title decrease pain 50%    Status Partially Met      PT LONG TERM GOAL #2   Title increase AROM of the right knee to 6-115 degrees flexion    Status Partially Met                 Plan - 02/13/20 1647    Clinical Impression Statement We worked a little more on balance and gait today, she is still very unsteady on airex, walkingwith the two canes she does very well, she does fatigue but not bad today, she she fatigues she gets more of a wobble and one time today had a small LOB while walking required CGA to right    PT Next Visit Plan will continue to work on strength, ROM and functional gait    Consulted and Agree with Plan of Care Patient           Patient will benefit from skilled therapeutic intervention in order to improve the following deficits and impairments:  Abnormal gait, Decreased range of motion, Decreased balance, Decreased scar mobility, Impaired flexibility, Increased edema, Decreased strength, Decreased mobility, Decreased endurance, Pain  Visit Diagnosis: Difficulty in walking, not elsewhere classified  Acute pain of right knee  Localized edema  Stiffness of right knee, not elsewhere classified  Muscle weakness (generalized)     Problem List Patient Active Problem List   Diagnosis Date Noted  . S/P total knee arthroplasty, right 12/23/2019  . Acalculous cholecystitis 08/24/2018  . Osteoarthritis of  right knee   . Vertigo   . DDD (degenerative disc disease), cervical   . Benign essential HTN   . History of DVT (deep vein thrombosis)   . Tachycardia   . Steroid-induced hyperglycemia   . Hypothyroidism   . Neuropathic pain   . Acute blood loss anemia   .  S/P lumbar fusion   . Closed L5 vertebral fracture ([place]) 08/31/2017  . Closed fracture of fifth lumbar vertebra ([place]) 08/30/2017  . Lumbar vertebral fracture ([place]) 07/17/2017  . Scoliosis 06/13/2017    Sumner Boast., PT 02/13/2020, 4:50 PM  [place]. [place], [place], [address] Phone: [phone]   Fax:  [phone]  Name: [name] MRN: [id number] Date of Birth: 1932/07/02

## 2020-02-17 ENCOUNTER — Encounter (INDEPENDENT_AMBULATORY_CARE_PROVIDER_SITE_OTHER): Payer: Self-pay | Admitting: Otolaryngology

## 2020-02-17 ENCOUNTER — Other Ambulatory Visit: Payer: Self-pay

## 2020-02-17 ENCOUNTER — Ambulatory Visit (INDEPENDENT_AMBULATORY_CARE_PROVIDER_SITE_OTHER): Payer: Medicare Other | Admitting: Otolaryngology

## 2020-02-17 VITALS — Temp 97.2°F

## 2020-02-17 DIAGNOSIS — K141 Geographic tongue: Secondary | ICD-10-CM | POA: Diagnosis not present

## 2020-02-17 DIAGNOSIS — J310 Chronic rhinitis: Secondary | ICD-10-CM

## 2020-02-17 NOTE — Progress Notes (Signed)
HPI: [name] is a 86 y.o. female who presents is referred by Dr. Mancel Bale for evaluation of chronic tongue lesion she has had for a little over 2 months.  She is not sure what caused this but she thinks it occurred when she tried a new toothpaste.  Denies any pain associated with this and no difficulty talking or swallowing.  She has been tried on antibiotics.  She also complains of a lot of postnasal drainage from her nose.Marland Kitchen  Past Medical History:  Diagnosis Date  . Acalculous cholecystitis 08/24/2018  . Arthritis   . DVT of lower extremity (deep venous thrombosis) ([place])    right leg - after back surgery in 2019  . GERD (gastroesophageal reflux disease)   . History of blood transfusion   . Hypothyroidism   . PONV (postoperative nausea and vomiting)   . Ptosis of both eyelids   . Vertigo   . Wears glasses    reading   Past Surgical History:  Procedure Laterality Date  . ABDOMINAL HYSTERECTOMY  1986  . APPLICATION OF ROBOTIC ASSISTANCE FOR SPINAL PROCEDURE N/A 07/17/2017   Procedure: APPLICATION OF ROBOTIC ASSISTANCE FOR SPINAL PROCEDURE;  Surgeon: Kristeen Miss, MD;  Location: [facility];  Service: Neurosurgery;  Laterality: N/A;  . APPLICATION OF ROBOTIC ASSISTANCE FOR SPINAL PROCEDURE N/A 09/01/2017   Procedure: APPLICATION OF ROBOTIC ASSISTANCE FOR SPINAL PROCEDURE;  Surgeon: Kristeen Miss, MD;  Location: [facility];  Service: Neurosurgery;  Laterality: N/A;  . [place]  2000   right  . CARPAL TUNNEL RELEASE  2012   left  . CHOLECYSTECTOMY N/A 08/24/2018   Procedure: LAPAROSCOPIC CHOLECYSTECTOMY WITH INTRAOPERATIVE CHOLANGIOGRAM;  Surgeon: Fanny Skates, MD;  Location: [facility];  Service: General;  Laterality: N/A;  . COLONOSCOPY    . CYSTOCELE REPAIR  2007   sling  . DISTAL INTERPHALANGEAL JOINT FUSION Right 02/04/2014   Procedure: FUSION DISTAL INTERPHALANGEAL JOINT RIGHT INDEX FINGER ;  Surgeon: Daryll Brod, MD;  Location: [facility];  Service: Orthopedics;  Laterality: Right;  . EYE SURGERY Bilateral    Cataract surgery with lens implant  . KNEE SURGERY  2009,2011   partial knee  . LAPAROSCOPY N/A 08/24/2018   Procedure: LAPAROSCOPY DIAGNOSTIC;  Surgeon: Fanny Skates, MD;  Location: [facility];  Service: General;  Laterality: N/A;  . OPEN REDUCTION INTERNAL FIXATION (ORIF) PROXIMAL PHALANX Left 08/30/2013   Procedure: OPEN REDUCTION INTERNAL FIXATION (ORIF) PROXIMAL PHALANX FRACTURE LEFT SMALL FINGER; SPLINT RING FINGER;  Surgeon: Wynonia Sours, MD;  Location: [facility];  Service: Orthopedics;  Laterality: Left;  . POSTERIOR LUMBAR FUSION 4 LEVEL N/A 07/17/2017   Procedure: Thoracic Ten - Lumbar Five revison of hardware with Mazor;  Surgeon: Kristeen Miss, MD;  Location: [facility];  Service: Neurosurgery;  Laterality: N/A;  Thoracic/Lumbar  . PTOSIS REPAIR Bilateral 07/27/2015   Procedure: PTOSIS REPAIR;  Surgeon: Cristine Polio, MD;  Location: [facility];  Service: Plastics;  Laterality: Bilateral;  . SHOULDER SURGERY     rt rcr,and lt  . TONSILLECTOMY    . TOTAL KNEE ARTHROPLASTY Right 12/23/2019   Procedure: TOTAL KNEE ARTHROPLASTY;  Surgeon: Vickey Huger, MD;  [facility];  Service: Orthopedics;  Laterality: Right;   Social History   Socioeconomic History  . Marital status: Married    Spouse name: Not on file  . Number of children: Not on file  . Years of education:  Not on file  . Highest education level: Not on file  Occupational History  . Not on file  Tobacco Use  . Smoking status: Former Research scientist (life sciences)  . Smokeless tobacco: Never Used  . Tobacco comment: 07/14/17-  quit for 62  Vaping Use  . Vaping Use: Never used  Substance and Sexual Activity  . Alcohol use: No  . Drug use: No  . Sexual activity: Not on file  Other Topics Concern  . Not on file  Social History Narrative  . Not on file   Social Determinants of Health   Financial Resource Strain:   .  Difficulty of Paying Living Expenses: Not on file  Food Insecurity:   . Worried About Charity fundraiser in the Last Year: Not on file  . Ran Out of Food in the Last Year: Not on file  Transportation Needs:   . Lack of Transportation (Medical): Not on file  . Lack of Transportation (Non-Medical): Not on file  Physical Activity:   . Days of Exercise per Week: Not on file  . Minutes of Exercise per Session: Not on file  Stress:   . Feeling of Stress : Not on file  Social Connections:   . Frequency of Communication with Friends and Family: Not on file  . Frequency of Social Gatherings with Friends and Family: Not on file  . Attends Religious Services: Not on file  . Active Member of Clubs or Organizations: Not on file  . Attends Archivist Meetings: Not on file  . Marital Status: Not on file   Family History  Problem Relation Age of Onset  . Breast cancer Neg Hx    Allergies  Allergen Reactions  . Keflex [Cephalexin] Hives   Prior to Admission medications   Medication Sig Start Date End Date Taking? Authorizing Provider  aspirin EC 325 MG tablet Take 1 tablet (325 mg total) by mouth 2 (two) times daily. 12/23/19 12/22/20 Yes Donia Ast, PA  diphenhydramine-acetaminophen (TYLENOL PM) 25-500 MG TABS tablet Take 2 tablets by mouth at bedtime.    Yes [provider]  levothyroxine (SYNTHROID, LEVOTHROID) 50 MCG tablet Take 50 mcg by mouth daily before breakfast.    Yes [provider]  Multiple Vitamin (MULTIVITAMIN WITH MINERALS) TABS tablet Take 1 tablet by mouth at bedtime.   Yes [provider]  OVER THE COUNTER MEDICATION Take 2 capsules by mouth at bedtime. Memory and Nerve Capsules   Yes [provider]  oxyCODONE (OXY IR/ROXICODONE) 5 MG immediate release tablet Take 1 tablet (5 mg total) by mouth every 4 (four) hours as needed for severe pain. 12/23/19  Yes Donia Ast, PA  tiZANidine (ZANAFLEX) 2 MG tablet Take 1  tablet (2 mg total) by mouth every 6 (six) hours as needed for muscle spasms. 12/23/19  Yes Donia Ast, PA  Vitamins-Lipotropics (B-50 PO) Take 1 tablet by mouth at bedtime.   Yes [provider]     Positive ROS: Otherwise negative  All other systems have been reviewed and were otherwise negative with the exception of those mentioned in the HPI and as above.  Physical Exam: Constitutional: Alert, well-appearing, no acute distress Ears: External ears without lesions or tenderness. Ear canals are clear bilaterally with intact, clear TMs.  Nasal: External nose without lesions. Septum midline with mild rhinitis.  Both middle meatus regions are clear.  No polyps noted.  No signs of infection..  Oral: Lips and gums without lesions.  On  examination of the tongue patient has an area on the right side of the tongue that is smooth with loss of normal papillae.  Remaining oral mucosa palate mucosa and buccal mucosa is normal.  On palpation of the tongue it is soft and nontender. Neck: No palpable adenopathy or masses. Respiratory: Breathing comfortably  Skin: No facial/neck lesions or rash noted.  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 Chronic rhinitis with no signs of infection.  Plan: Reviewed with patient that the tongue lesion appears benign and the treatment of this would be oral hygiene and discussed use of Biotene mouthwash or if she has tenderness to try use of chewing to Lactinex tablets 3 times daily for a week.  This has been shown to be helpful in some aphthous type ulcers although this is not ulcerated and not painful presently. She will follow-up in 3 months for recheck. For postnasal drainage recommended use of either antihistamine OTC, saline rinses or use of Nasacort or Flonase.   Radene Journey, MD   CC:

## 2020-02-18 ENCOUNTER — Ambulatory Visit: Payer: Medicare Other | Admitting: Physical Therapy

## 2020-02-20 ENCOUNTER — Other Ambulatory Visit: Payer: Self-pay

## 2020-02-20 ENCOUNTER — Encounter: Payer: Self-pay | Admitting: Physical Therapy

## 2020-02-20 ENCOUNTER — Ambulatory Visit: Payer: Medicare Other | Admitting: Physical Therapy

## 2020-02-20 DIAGNOSIS — R262 Difficulty in walking, not elsewhere classified: Secondary | ICD-10-CM | POA: Diagnosis not present

## 2020-02-20 DIAGNOSIS — R600 Localized edema: Secondary | ICD-10-CM

## 2020-02-20 DIAGNOSIS — M6281 Muscle weakness (generalized): Secondary | ICD-10-CM

## 2020-02-20 DIAGNOSIS — M25561 Pain in right knee: Secondary | ICD-10-CM

## 2020-02-20 DIAGNOSIS — M25661 Stiffness of right knee, not elsewhere classified: Secondary | ICD-10-CM

## 2020-02-20 NOTE — Therapy (Signed)
[facility]. [place], [place], [address] Phone: [phone]   Fax:  [phone]  Physical Therapy Treatment  Patient Details  Name: [name] MRN: [id number] Date of Birth: 12/15/1932 Referring Provider (PT): Lucey   Encounter Date: 02/20/2020   PT End of Session - 02/20/20 1653    Visit Number 13    Date for PT Re-Evaluation 03/02/20    PT Start Time 1605    PT Stop Time 1649    PT Time Calculation (min) 44 min    Activity Tolerance Patient tolerated treatment well    Behavior During Therapy [facility] for tasks assessed/performed           Past Medical History:  Diagnosis Date  . Acalculous cholecystitis 08/24/2018  . Arthritis   . DVT of lower extremity (deep venous thrombosis) ([place])    right leg - after back surgery in 2019  . GERD (gastroesophageal reflux disease)   . History of blood transfusion   . Hypothyroidism   . PONV (postoperative nausea and vomiting)   . Ptosis of both eyelids   . Vertigo   . Wears glasses    reading    Past Surgical History:  Procedure Laterality Date  . ABDOMINAL HYSTERECTOMY  1986  . APPLICATION OF ROBOTIC ASSISTANCE FOR SPINAL PROCEDURE N/A 07/17/2017   Procedure: APPLICATION OF ROBOTIC ASSISTANCE FOR SPINAL PROCEDURE;  Surgeon: Kristeen Miss, MD;  Location: [facility];  Service: Neurosurgery;  Laterality: N/A;  . APPLICATION OF ROBOTIC ASSISTANCE FOR SPINAL PROCEDURE N/A 09/01/2017   Procedure: APPLICATION OF ROBOTIC ASSISTANCE FOR SPINAL PROCEDURE;  Surgeon: Kristeen Miss, MD;  Location: [facility];  Service: Neurosurgery;  Laterality: N/A;  . [place]  2000   right  . CARPAL TUNNEL RELEASE  2012   left  . CHOLECYSTECTOMY N/A 08/24/2018   Procedure: LAPAROSCOPIC CHOLECYSTECTOMY WITH INTRAOPERATIVE CHOLANGIOGRAM;  Surgeon: Fanny Skates, MD;  Location: [facility];  Service: General;  Laterality: N/A;  . COLONOSCOPY    .  CYSTOCELE REPAIR  2007   sling  . DISTAL INTERPHALANGEAL JOINT FUSION Right 02/04/2014   Procedure: FUSION DISTAL INTERPHALANGEAL JOINT RIGHT INDEX FINGER ;  Surgeon: Daryll Brod, MD;  Location: [facility];  Service: Orthopedics;  Laterality: Right;  . EYE SURGERY Bilateral    Cataract surgery with lens implant  . KNEE SURGERY  2009,2011   partial knee  . LAPAROSCOPY N/A 08/24/2018   Procedure: LAPAROSCOPY DIAGNOSTIC;  Surgeon: Fanny Skates, MD;  Location: [facility];  Service: General;  Laterality: N/A;  . OPEN REDUCTION INTERNAL FIXATION (ORIF) PROXIMAL PHALANX Left 08/30/2013   Procedure: OPEN REDUCTION INTERNAL FIXATION (ORIF) PROXIMAL PHALANX FRACTURE LEFT SMALL FINGER; SPLINT RING FINGER;  Surgeon: Wynonia Sours, MD;  Location: [facility];  Service: Orthopedics;  Laterality: Left;  . POSTERIOR LUMBAR FUSION 4 LEVEL N/A 07/17/2017   Procedure: Thoracic Ten - Lumbar Five revison of hardware with Mazor;  Surgeon: Kristeen Miss, MD;  Location: [facility];  Service: Neurosurgery;  Laterality: N/A;  Thoracic/Lumbar  . PTOSIS REPAIR Bilateral 07/27/2015   Procedure: PTOSIS REPAIR;  Surgeon: Cristine Polio, MD;  Location: [facility];  Service: Plastics;  Laterality: Bilateral;  . SHOULDER SURGERY     rt rcr,and lt  . TONSILLECTOMY    . TOTAL KNEE ARTHROPLASTY Right 12/23/2019   Procedure: TOTAL KNEE ARTHROPLASTY;  Surgeon: Vickey Huger, MD;  [facility];  Service: Orthopedics;  Laterality: Right;    There were no vitals filed for this visit.   Subjective Assessment - 02/20/20 1612    Subjective I was sick and had to cancel on Tuesday.  My knee really ached last night    Currently in Pain? Yes    Pain Score 2     Pain Location Knee    Pain Orientation Right    Aggravating Factors  woke up in pain last night                             [facility] Adult PT Treatment/Exercise - 02/20/20 0001      Ambulation/Gait   Gait Comments stairs  step over step with 4" and 6" steps, gait 250 feet with SPC (2), some cues for step length and posture      High Level Balance   High Level Balance Comments on airex green tband 2 way scap stabilization, 6" toe touches, then 6" toe touches while standing on the airex      Knee/Hip Exercises: Aerobic   Recumbent Bike 4 minutes    Nustep level 6 6 minutes      Knee/Hip Exercises: Standing   Hip Flexion Both;2 sets;10 reps    Hip Flexion Limitations 2.5#    Hip Abduction Both;2 sets;10 reps    Abduction Limitations 2.5#    Hip Extension Both;2 sets;10 reps    Extension Limitations 2.5#, a lot of tactile cues to decrease the forward trunk lean and get better glute activation      Knee/Hip Exercises: Seated   Other Seated Knee/Hip Exercises seated toe raises, 90/90, straight leg DF      Manual Therapy   Manual Therapy Passive ROM;Soft tissue mobilization    Soft tissue mobilization gentle to the scar, to the distal ITB    Passive ROM flexion                    PT Short Term Goals - 01/09/20 1520      PT SHORT TERM GOAL #1   Title independent with initial HEP    Status Achieved             PT Long Term Goals - 02/20/20 1657      PT LONG TERM GOAL #1   Title decrease pain 50%    Status Partially Met      PT LONG TERM GOAL #2   Title increase AROM of the right knee to 6-115 degrees flexion    Status Partially Met                 Plan - 02/20/20 1655    Clinical Impression Statement Patient does very well with the two canes, she does get fatigued and has some back pain with walking.  She does have the weakness in the right ankle DF.  Hips are weak as well.The scar is much better with mobility and less tenderness    PT Next Visit Plan will continue to work on strength, ROM and functional gait    Consulted and Agree with Plan of Care Patient           Patient will benefit from skilled therapeutic intervention in order to improve the following deficits  and impairments:  Abnormal gait, Decreased range of motion, Decreased balance, Decreased scar mobility, Impaired flexibility, Increased edema, Decreased strength, Decreased mobility, Decreased endurance, Pain  Visit Diagnosis: Difficulty  in walking, not elsewhere classified  Acute pain of right knee  Localized edema  Stiffness of right knee, not elsewhere classified  Muscle weakness (generalized)     Problem List Patient Active Problem List   Diagnosis Date Noted  . S/P total knee arthroplasty, right 12/23/2019  . Acalculous cholecystitis 08/24/2018  . Osteoarthritis of right knee   . Vertigo   . DDD (degenerative disc disease), cervical   . Benign essential HTN   . History of DVT (deep vein thrombosis)   . Tachycardia   . Steroid-induced hyperglycemia   . Hypothyroidism   . Neuropathic pain   . Acute blood loss anemia   . S/P lumbar fusion   . Closed L5 vertebral fracture ([place]) 08/31/2017  . Closed fracture of fifth lumbar vertebra ([place]) 08/30/2017  . Lumbar vertebral fracture ([place]) 07/17/2017  . Scoliosis 06/13/2017    Sumner Boast., PT 02/20/2020, 4:58 PM  [facility]. [place], [place], [address] Phone: [phone]   Fax:  [phone]  Name: Maleia Weems MRN: [id number] Date of Birth: 12-01-1932

## 2020-02-25 ENCOUNTER — Encounter: Payer: Medicare Other | Admitting: Physical Therapy

## 2020-03-02 ENCOUNTER — Encounter: Payer: Medicare Other | Admitting: Physical Therapy

## 2020-03-03 ENCOUNTER — Other Ambulatory Visit: Payer: Self-pay

## 2020-03-03 ENCOUNTER — Ambulatory Visit: Payer: Medicare Other | Admitting: Physical Therapy

## 2020-03-03 ENCOUNTER — Encounter: Payer: Self-pay | Admitting: Physical Therapy

## 2020-03-03 DIAGNOSIS — M6281 Muscle weakness (generalized): Secondary | ICD-10-CM

## 2020-03-03 DIAGNOSIS — R600 Localized edema: Secondary | ICD-10-CM

## 2020-03-03 DIAGNOSIS — M25661 Stiffness of right knee, not elsewhere classified: Secondary | ICD-10-CM

## 2020-03-03 DIAGNOSIS — R262 Difficulty in walking, not elsewhere classified: Secondary | ICD-10-CM | POA: Diagnosis not present

## 2020-03-03 DIAGNOSIS — M25561 Pain in right knee: Secondary | ICD-10-CM

## 2020-03-03 NOTE — Therapy (Signed)
[facility]. [place], [place], [address] Phone: [phone]   Fax:  [phone]  Physical Therapy Treatment  Patient Details  Name: [name] MRN: [id number] Date of Birth: 20-Jul-1932 Referring Provider (PT): Lucey   Encounter Date: 03/03/2020   PT End of Session - 03/03/20 1654    Visit Number 14    Date for PT Re-Evaluation 04/02/20    PT Start Time 1608    PT Stop Time 1654    PT Time Calculation (min) 46 min    Activity Tolerance Patient tolerated treatment well    Behavior During Therapy [facility] for tasks assessed/performed           Past Medical History:  Diagnosis Date  . Acalculous cholecystitis 08/24/2018  . Arthritis   . DVT of lower extremity (deep venous thrombosis) ([place])    right leg - after back surgery in 2019  . GERD (gastroesophageal reflux disease)   . History of blood transfusion   . Hypothyroidism   . PONV (postoperative nausea and vomiting)   . Ptosis of both eyelids   . Vertigo   . Wears glasses    reading    Past Surgical History:  Procedure Laterality Date  . ABDOMINAL HYSTERECTOMY  1986  . APPLICATION OF ROBOTIC ASSISTANCE FOR SPINAL PROCEDURE N/A 07/17/2017   Procedure: APPLICATION OF ROBOTIC ASSISTANCE FOR SPINAL PROCEDURE;  Surgeon: Kristeen Miss, MD;  Location: [facility];  Service: Neurosurgery;  Laterality: N/A;  . APPLICATION OF ROBOTIC ASSISTANCE FOR SPINAL PROCEDURE N/A 09/01/2017   Procedure: APPLICATION OF ROBOTIC ASSISTANCE FOR SPINAL PROCEDURE;  Surgeon: Kristeen Miss, MD;  Location: [facility];  Service: Neurosurgery;  Laterality: N/A;  . [place]  2000   right  . CARPAL TUNNEL RELEASE  2012   left  . CHOLECYSTECTOMY N/A 08/24/2018   Procedure: LAPAROSCOPIC CHOLECYSTECTOMY WITH INTRAOPERATIVE CHOLANGIOGRAM;  Surgeon: Fanny Skates, MD;  Location: [facility];  Service: General;  Laterality: N/A;  . COLONOSCOPY    .  CYSTOCELE REPAIR  2007   sling  . DISTAL INTERPHALANGEAL JOINT FUSION Right 02/04/2014   Procedure: FUSION DISTAL INTERPHALANGEAL JOINT RIGHT INDEX FINGER ;  Surgeon: Daryll Brod, MD;  Location: [facility];  Service: Orthopedics;  Laterality: Right;  . EYE SURGERY Bilateral    Cataract surgery with lens implant  . KNEE SURGERY  2009,2011   partial knee  . LAPAROSCOPY N/A 08/24/2018   Procedure: LAPAROSCOPY DIAGNOSTIC;  Surgeon: Fanny Skates, MD;  Location: [facility];  Service: General;  Laterality: N/A;  . OPEN REDUCTION INTERNAL FIXATION (ORIF) PROXIMAL PHALANX Left 08/30/2013   Procedure: OPEN REDUCTION INTERNAL FIXATION (ORIF) PROXIMAL PHALANX FRACTURE LEFT SMALL FINGER; SPLINT RING FINGER;  Surgeon: Wynonia Sours, MD;  Location: [place] [facility] [place];  Service: Orthopedics;  Laterality: Left;  . POSTERIOR LUMBAR FUSION 4 LEVEL N/A 07/17/2017   Procedure: Thoracic Ten - Lumbar Five revison of hardware with Mazor;  Surgeon: Kristeen Miss, MD;  Location: [facility];  Service: Neurosurgery;  Laterality: N/A;  Thoracic/Lumbar  . PTOSIS REPAIR Bilateral 07/27/2015   Procedure: PTOSIS REPAIR;  Surgeon: Cristine Polio, MD;  Location: [facility] [place];  Service: Plastics;  Laterality: Bilateral;  . SHOULDER SURGERY     rt rcr,and lt  . TONSILLECTOMY    . TOTAL KNEE ARTHROPLASTY Right 12/23/2019   Procedure: TOTAL KNEE ARTHROPLASTY;  Surgeon: Vickey Huger, MD;  [facility];  Service: Orthopedics;  Laterality: Right;    There were no vitals filed for this visit.   Subjective Assessment - 03/03/20 1617    Subjective My back has been hurting did some injections  last week    Currently in Pain? Yes    Pain Score 4     Pain Location Back    Pain Orientation Mid    Pain Descriptors / Indicators Aching;Sore    Aggravating Factors  I think it is from sitting too much              [facility] PT Assessment - 03/03/20 0001      AROM   Right Knee Extension 3    Right Knee  Flexion 115      PROM   Right Knee Extension 0    Right Knee Flexion 130                         [facility] Adult PT Treatment/Exercise - 03/03/20 0001      Knee/Hip Exercises: Aerobic   Recumbent Bike 6 minuteslevel 2    Nustep level 6 6 minutes      Knee/Hip Exercises: Machines for Strengthening   Cybex Knee Extension 10# 3x10    Cybex Knee Flexion 25# 3x10      Knee/Hip Exercises: Seated   Ball Squeeze 20    Other Seated Knee/Hip Exercises seated DF, 90/90, straight leg DF      Knee/Hip Exercises: Supine   Short Arc Quad Sets Right;3 sets;10 reps    Short Arc Quad Sets Limitations 3#      Manual Therapy   Manual Therapy Passive ROM;Soft tissue mobilization    Soft tissue mobilization gentle to the scar, to the distal ITB    Passive ROM flexion                    PT Short Term Goals - 01/09/20 1520      PT SHORT TERM GOAL #1   Title independent with initial HEP    Status Achieved             PT Long Term Goals - 03/03/20 1725      PT LONG TERM GOAL #1   Title decrease pain 50%    Status Partially Met      PT LONG TERM GOAL #2   Title increase AROM of the right knee to 6-115 degrees flexion    Status Achieved                 Plan - 03/03/20 1654    Clinical Impression Statement Patient reports some incresaed LBP over the past week or so, she had an epidural injection that helped the pain in her hips but she reports pain in the back.  Her ROM is great, her biggest issue is her gait, she uses a FWW and really relies on it, without she is unsteady, with a SPC she can go about 30 feet prior to trendelenberg coming on due to weakness and I worry that this will incresae LBP, I have had her use two SPC's and she does well with this but will start the trunk lean at about 120 feet.    PT Next Visit Plan will continue to work on strength and functional gait    Consulted and Agree with Plan of Care Patient           Patient will benefit  from skilled  therapeutic intervention in order to improve the following deficits and impairments:  Abnormal gait, Decreased range of motion, Decreased balance, Decreased scar mobility, Impaired flexibility, Increased edema, Decreased strength, Decreased mobility, Decreased endurance, Pain  Visit Diagnosis: Difficulty in walking, not elsewhere classified  Acute pain of right knee  Localized edema  Stiffness of right knee, not elsewhere classified  Muscle weakness (generalized)     Problem List Patient Active Problem List   Diagnosis Date Noted  . S/P total knee arthroplasty, right 12/23/2019  . Acalculous cholecystitis 08/24/2018  . Osteoarthritis of right knee   . Vertigo   . DDD (degenerative disc disease), cervical   . Benign essential HTN   . History of DVT (deep vein thrombosis)   . Tachycardia   . Steroid-induced hyperglycemia   . Hypothyroidism   . Neuropathic pain   . Acute blood loss anemia   . S/P lumbar fusion   . Closed L5 vertebral fracture ([place]) 08/31/2017  . Closed fracture of fifth lumbar vertebra ([place]) 08/30/2017  . Lumbar vertebral fracture ([place]) 07/17/2017  . Scoliosis 06/13/2017    Sumner Boast., PT 03/03/2020, 5:26 PM  [facility]. [place], [place], [address] Phone: [phone]   Fax:  [phone]  Name: [name] MRN: [id number] Date of Birth: 1932-09-13

## 2020-03-05 ENCOUNTER — Encounter: Payer: Self-pay | Admitting: Physical Therapy

## 2020-03-05 ENCOUNTER — Ambulatory Visit: Payer: Medicare Other | Attending: Orthopedic Surgery | Admitting: Physical Therapy

## 2020-03-05 ENCOUNTER — Other Ambulatory Visit: Payer: Self-pay

## 2020-03-05 DIAGNOSIS — M25561 Pain in right knee: Secondary | ICD-10-CM

## 2020-03-05 DIAGNOSIS — M62830 Muscle spasm of back: Secondary | ICD-10-CM | POA: Diagnosis present

## 2020-03-05 DIAGNOSIS — R600 Localized edema: Secondary | ICD-10-CM | POA: Diagnosis present

## 2020-03-05 DIAGNOSIS — M6281 Muscle weakness (generalized): Secondary | ICD-10-CM | POA: Diagnosis present

## 2020-03-05 DIAGNOSIS — M545 Low back pain, unspecified: Secondary | ICD-10-CM | POA: Insufficient documentation

## 2020-03-05 DIAGNOSIS — R262 Difficulty in walking, not elsewhere classified: Secondary | ICD-10-CM | POA: Diagnosis not present

## 2020-03-05 DIAGNOSIS — M25661 Stiffness of right knee, not elsewhere classified: Secondary | ICD-10-CM

## 2020-03-05 NOTE — Therapy (Signed)
[facility]. [place], [place], [address] Phone: [phone]   Fax:  [phone]  Physical Therapy Treatment  Patient Details  Name: [name] MRN: [id number] Date of Birth: 1932-07-10 Referring Provider (PT): Lucey   Encounter Date: 03/05/2020   PT End of Session - 03/05/20 1652    Visit Number 15    Date for PT Re-Evaluation 04/02/20    PT Start Time 1607    PT Stop Time 1652    PT Time Calculation (min) 45 min    Activity Tolerance Patient tolerated treatment well    Behavior During Therapy [facility] for tasks assessed/performed           Past Medical History:  Diagnosis Date  . Acalculous cholecystitis 08/24/2018  . Arthritis   . DVT of lower extremity (deep venous thrombosis) ([place])    right leg - after back surgery in 2019  . GERD (gastroesophageal reflux disease)   . History of blood transfusion   . Hypothyroidism   . PONV (postoperative nausea and vomiting)   . Ptosis of both eyelids   . Vertigo   . Wears glasses    reading    Past Surgical History:  Procedure Laterality Date  . ABDOMINAL HYSTERECTOMY  1986  . APPLICATION OF ROBOTIC ASSISTANCE FOR SPINAL PROCEDURE N/A 07/17/2017   Procedure: APPLICATION OF ROBOTIC ASSISTANCE FOR SPINAL PROCEDURE;  Surgeon: Kristeen Miss, MD;  Location: [place];  Service: Neurosurgery;  Laterality: N/A;  . APPLICATION OF ROBOTIC ASSISTANCE FOR SPINAL PROCEDURE N/A 09/01/2017   Procedure: APPLICATION OF ROBOTIC ASSISTANCE FOR SPINAL PROCEDURE;  Surgeon: Kristeen Miss, MD;  Location: [place];  Service: Neurosurgery;  Laterality: N/A;  . [place]  2000   right  . CARPAL TUNNEL RELEASE  2012   left  . CHOLECYSTECTOMY N/A 08/24/2018   Procedure: LAPAROSCOPIC CHOLECYSTECTOMY WITH INTRAOPERATIVE CHOLANGIOGRAM;  Surgeon: Fanny Skates, MD;  Location: [place];  Service: General;  Laterality: N/A;  . COLONOSCOPY    .  CYSTOCELE REPAIR  2007   sling  . DISTAL INTERPHALANGEAL JOINT FUSION Right 02/04/2014   Procedure: FUSION DISTAL INTERPHALANGEAL JOINT RIGHT INDEX FINGER ;  Surgeon: Daryll Brod, MD;  Location: [place];  Service: Orthopedics;  Laterality: Right;  . EYE SURGERY Bilateral    Cataract surgery with lens implant  . KNEE SURGERY  2009,2011   partial knee  . LAPAROSCOPY N/A 08/24/2018   Procedure: LAPAROSCOPY DIAGNOSTIC;  Surgeon: Fanny Skates, MD;  Location: [place];  Service: General;  Laterality: N/A;  . OPEN REDUCTION INTERNAL FIXATION (ORIF) PROXIMAL PHALANX Left 08/30/2013   Procedure: OPEN REDUCTION INTERNAL FIXATION (ORIF) PROXIMAL PHALANX FRACTURE LEFT SMALL FINGER; SPLINT RING FINGER;  Surgeon: Wynonia Sours, MD;  Location: [place];  Service: Orthopedics;  Laterality: Left;  . POSTERIOR LUMBAR FUSION 4 LEVEL N/A 07/17/2017   Procedure: Thoracic Ten - Lumbar Five revison of hardware with Mazor;  Surgeon: Kristeen Miss, MD;  Location: [place];  Service: Neurosurgery;  Laterality: N/A;  Thoracic/Lumbar  . PTOSIS REPAIR Bilateral 07/27/2015   Procedure: PTOSIS REPAIR;  Surgeon: Cristine Polio, MD;  Location: [place];  Service: Plastics;  Laterality: Bilateral;  . SHOULDER SURGERY     rt rcr,and lt  . TONSILLECTOMY    . TOTAL KNEE ARTHROPLASTY Right 12/23/2019   Procedure: TOTAL KNEE ARTHROPLASTY;  Surgeon: Vickey Huger, MD;  [facility];  Service: Orthopedics;  Laterality: Right;    There were no vitals filed for this visit.   Subjective Assessment - 03/05/20 1634    Subjective Patient has c/o mid to low back pain, had an injection last week in the lower area and reports that the hips are better but the back is hurting more.  She has minimal to no c/o for the knee    Currently in Pain? Yes    Pain Score 6     Pain Location Back    Pain Orientation Mid    Aggravating Factors  unsure, but reports pain with walking                              [facility] Adult PT Treatment/Exercise - 03/05/20 0001      Ambulation/Gait   Gait Comments stairs step over step with 4" and 6" steps, gait 200 feet x 2 with SPC (2), some cues for step length and posture, with this she reports that she does not have LBP      Knee/Hip Exercises: Aerobic   Recumbent Bike 6 minuteslevel 2    Nustep level 6 6 minutes      Knee/Hip Exercises: Machines for Strengthening   Cybex Knee Extension 10# 3x10    Cybex Knee Flexion 25# 3x10      Knee/Hip Exercises: Standing   Hip Flexion Both;2 sets;10 reps    Hip Flexion Limitations 2.5#    Hip Abduction Both;2 sets;10 reps    Abduction Limitations 2.5#    Hip Extension Both;2 sets;10 reps    Extension Limitations 2.5#, a lot of tactile cues to decrease the forward trunk lean and get better glute activation      Knee/Hip Exercises: Seated   Ball Squeeze 20      Knee/Hip Exercises: Supine   Short Arc Quad Sets Right;3 sets;10 reps    Short Arc Quad Sets Limitations 5#                    PT Short Term Goals - 01/09/20 1520      PT SHORT TERM GOAL #1   Title independent with initial HEP    Status Achieved             PT Long Term Goals - 03/05/20 1656      PT LONG TERM GOAL #1   Title decrease pain 50%    Status Partially Met      PT LONG TERM GOAL #2   Title increase AROM of the right knee to 6-115 degrees flexion    Status Achieved                 Plan - 03/05/20 1652    Clinical Impression Statement Patient seems to be limited by back pain, the ROM and strength of the surgical knee are doing very well, she is unsure of why she is having this new pain, but has had multiple back surgeries.  When walking with two canes and with some cues for posture she told me that she did not have back pain    PT Next Visit Plan will write MD note, next visit    Consulted and Agree with Plan of Care Patient           Patient will benefit from skilled  therapeutic intervention in order to improve the following deficits and impairments:  Abnormal gait, Decreased range of motion,  Decreased balance, Decreased scar mobility, Impaired flexibility, Increased edema, Decreased strength, Decreased mobility, Decreased endurance, Pain  Visit Diagnosis: Difficulty in walking, not elsewhere classified  Acute pain of right knee  Localized edema  Stiffness of right knee, not elsewhere classified  Muscle weakness (generalized)     Problem List Patient Active Problem List   Diagnosis Date Noted  . S/P total knee arthroplasty, right 12/23/2019  . Acalculous cholecystitis 08/24/2018  . Osteoarthritis of right knee   . Vertigo   . DDD (degenerative disc disease), cervical   . Benign essential HTN   . History of DVT (deep vein thrombosis)   . Tachycardia   . Steroid-induced hyperglycemia   . Hypothyroidism   . Neuropathic pain   . Acute blood loss anemia   . S/P lumbar fusion   . Closed L5 vertebral fracture ([place]) 08/31/2017  . Closed fracture of fifth lumbar vertebra ([place]) 08/30/2017  . Lumbar vertebral fracture ([place]) 07/17/2017  . Scoliosis 06/13/2017    Sumner Boast., PT 03/05/2020, 4:57 PM  [facility]. [place], [place], [address] Phone: [phone]   Fax:  [phone]  Name: [name] MRN: [id number] Date of Birth: 1933-01-27

## 2020-03-10 ENCOUNTER — Ambulatory Visit: Payer: Medicare Other | Admitting: Physical Therapy

## 2020-03-10 ENCOUNTER — Encounter: Payer: Self-pay | Admitting: Physical Therapy

## 2020-03-10 ENCOUNTER — Other Ambulatory Visit: Payer: Self-pay

## 2020-03-10 DIAGNOSIS — M6281 Muscle weakness (generalized): Secondary | ICD-10-CM

## 2020-03-10 DIAGNOSIS — R262 Difficulty in walking, not elsewhere classified: Secondary | ICD-10-CM

## 2020-03-10 DIAGNOSIS — M25661 Stiffness of right knee, not elsewhere classified: Secondary | ICD-10-CM

## 2020-03-10 DIAGNOSIS — R600 Localized edema: Secondary | ICD-10-CM

## 2020-03-10 DIAGNOSIS — M25561 Pain in right knee: Secondary | ICD-10-CM

## 2020-03-10 NOTE — Therapy (Signed)
[facility]. [place], [place], [address] Phone: [phone]   Fax:  [phone]  Physical Therapy Treatment  Patient Details  Name: Trinette Vera MRN: [id number] Date of Birth: 11/06/1932 Referring Provider (PT): Lucey   Encounter Date: 03/10/2020   PT End of Session - 03/10/20 1638    Visit Number 16    Date for PT Re-Evaluation 04/02/20    PT Start Time 1600    PT Stop Time 1649    PT Time Calculation (min) 49 min    Activity Tolerance Patient tolerated treatment well    Behavior During Therapy [facility] for tasks assessed/performed           Past Medical History:  Diagnosis Date  . Acalculous cholecystitis 08/24/2018  . Arthritis   . DVT of lower extremity (deep venous thrombosis) ([place])    right leg - after back surgery in 2019  . GERD (gastroesophageal reflux disease)   . History of blood transfusion   . Hypothyroidism   . PONV (postoperative nausea and vomiting)   . Ptosis of both eyelids   . Vertigo   . Wears glasses    reading    Past Surgical History:  Procedure Laterality Date  . ABDOMINAL HYSTERECTOMY  1986  . APPLICATION OF ROBOTIC ASSISTANCE FOR SPINAL PROCEDURE N/A 07/17/2017   Procedure: APPLICATION OF ROBOTIC ASSISTANCE FOR SPINAL PROCEDURE;  Surgeon: Kristeen Miss, MD;  Location: [place];  Service: Neurosurgery;  Laterality: N/A;  . APPLICATION OF ROBOTIC ASSISTANCE FOR SPINAL PROCEDURE N/A 09/01/2017   Procedure: APPLICATION OF ROBOTIC ASSISTANCE FOR SPINAL PROCEDURE;  Surgeon: Kristeen Miss, MD;  Location: [place] [facility];  Service: Neurosurgery;  Laterality: N/A;  . [place]  2000   right  . CARPAL TUNNEL RELEASE  2012   left  . CHOLECYSTECTOMY N/A 08/24/2018   Procedure: LAPAROSCOPIC CHOLECYSTECTOMY WITH INTRAOPERATIVE CHOLANGIOGRAM;  Surgeon: Fanny Skates, MD;  Location: [place];  Service: General;  Laterality: N/A;  . COLONOSCOPY    .  CYSTOCELE REPAIR  2007   sling  . DISTAL INTERPHALANGEAL JOINT FUSION Right 02/04/2014   Procedure: FUSION DISTAL INTERPHALANGEAL JOINT RIGHT INDEX FINGER ;  Surgeon: Daryll Brod, MD;  Location: [facility];  Service: Orthopedics;  Laterality: Right;  . EYE SURGERY Bilateral    Cataract surgery with lens implant  . KNEE SURGERY  2009,2011   partial knee  . LAPAROSCOPY N/A 08/24/2018   Procedure: LAPAROSCOPY DIAGNOSTIC;  Surgeon: Fanny Skates, MD;  Location: [place];  Service: General;  Laterality: N/A;  . OPEN REDUCTION INTERNAL FIXATION (ORIF) PROXIMAL PHALANX Left 08/30/2013   Procedure: OPEN REDUCTION INTERNAL FIXATION (ORIF) PROXIMAL PHALANX FRACTURE LEFT SMALL FINGER; SPLINT RING FINGER;  Surgeon: Wynonia Sours, MD;  Location: [place];  Service: Orthopedics;  Laterality: Left;  . POSTERIOR LUMBAR FUSION 4 LEVEL N/A 07/17/2017   Procedure: Thoracic Ten - Lumbar Five revison of hardware with Mazor;  Surgeon: Kristeen Miss, MD;  Location: [place];  Service: Neurosurgery;  Laterality: N/A;  Thoracic/Lumbar  . PTOSIS REPAIR Bilateral 07/27/2015   Procedure: PTOSIS REPAIR;  Surgeon: Cristine Polio, MD;  Location: [place];  Service: Plastics;  Laterality: Bilateral;  . SHOULDER SURGERY     rt rcr,and lt  . TONSILLECTOMY    . TOTAL KNEE ARTHROPLASTY Right 12/23/2019   Procedure: TOTAL KNEE ARTHROPLASTY;  Surgeon: Vickey Huger, MD;  [facility];  Service: Orthopedics;  Laterality: Right;    There were no vitals filed for this visit.   Subjective Assessment - 03/10/20 1601    Subjective Patient reports that the low back is still hurting.  She sees the back doctor Friday, and the knee doctor Thursday.    Currently in Pain? Yes    Pain Score 6     Pain Location Back    Pain Descriptors / Indicators Aching;Sore    Aggravating Factors  back is just hurting              [facility] PT Assessment - 03/10/20 0001      AROM   Right Knee Extension  3    Right Knee Flexion 117      PROM   Right Knee Extension 0    Right Knee Flexion 130                         [facility] Adult PT Treatment/Exercise - 03/10/20 0001      Ambulation/Gait   Gait Comments stairs step over step with 4" and 6" steps, gait 200 feet x 2 with SPC (2), some cues for step length and posture, with this she reports that she does not have LBP      High Level Balance   High Level Balance Comments on airex 6" toe touches with two canes for balance      Knee/Hip Exercises: Stretches   Gastroc Stretch Both;3 reps;20 seconds      Knee/Hip Exercises: Aerobic   Recumbent Bike 6 minuteslevel 2.5    Nustep level 4 x 3 minutes, level 6 x 3 minutes      Knee/Hip Exercises: Machines for Strengthening   Cybex Knee Extension 10# 3x10    Cybex Knee Flexion 25# 3x10      Knee/Hip Exercises: Standing   Hip Flexion Both;2 sets;10 reps    Hip Flexion Limitations 3#    Hip Abduction Both;2 sets;10 reps    Abduction Limitations 3#    Hip Extension Both;2 sets;10 reps    Extension Limitations 3#      Knee/Hip Exercises: Seated   Other Seated Knee/Hip Exercises seated DF, 90/90, straight leg DF                    PT Short Term Goals - 01/09/20 1520      PT SHORT TERM GOAL #1   Title independent with initial HEP    Status Achieved             PT Long Term Goals - 03/10/20 1641      PT LONG TERM GOAL #1   Title decrease pain 50%    Status Achieved      PT LONG TERM GOAL #2   Title increase AROM of the right knee to 6-115 degrees flexion    Status Achieved      PT LONG TERM GOAL #3   Title increase LE strength to 4+/5    Status Achieved      PT LONG TERM GOAL #4   Title go up and down stairs step over step    Status Partially Met      PT LONG TERM GOAL #5   Title decrease TUG to 19 seconds    Status Achieved                 Plan - 03/10/20 1639    Clinical Impression Statement Ms.  Cordy's knee is doing great, great  ROM, good strength, less pain and less swelling.  Here over the psat two weeks the biggest issue is her back is hurting more.  I have had her walk with 1 cane and 2 canes, she looks great with the two good posture and less trendelenberg, with the single cane she fatigues easily and starts to wabble more.  This is more than likely from her past back issues    PT Next Visit Plan I feel like the knee could be discharged, her walking and other seems to be limited mostly by back issues, pain, spasms and weakness.  She does have some difficulty going down stairs step over step    Consulted and Agree with Plan of Care Patient           Patient will benefit from skilled therapeutic intervention in order to improve the following deficits and impairments:  Abnormal gait, Decreased range of motion, Decreased balance, Decreased scar mobility, Impaired flexibility, Increased edema, Decreased strength, Decreased mobility, Decreased endurance, Pain  Visit Diagnosis: Difficulty in walking, not elsewhere classified  Acute pain of right knee  Localized edema  Stiffness of right knee, not elsewhere classified  Muscle weakness (generalized)     Problem List Patient Active Problem List   Diagnosis Date Noted  . S/P total knee arthroplasty, right 12/23/2019  . Acalculous cholecystitis 08/24/2018  . Osteoarthritis of right knee   . Vertigo   . DDD (degenerative disc disease), cervical   . Benign essential HTN   . History of DVT (deep vein thrombosis)   . Tachycardia   . Steroid-induced hyperglycemia   . Hypothyroidism   . Neuropathic pain   . Acute blood loss anemia   . S/P lumbar fusion   . Closed L5 vertebral fracture ([place]) 08/31/2017  . Closed fracture of fifth lumbar vertebra ([place]) 08/30/2017  . Lumbar vertebral fracture ([place]) 07/17/2017  . Scoliosis 06/13/2017    Sumner Boast., PT 03/10/2020, 4:42 PM  [facility]. [place], [place], [address] Phone: [phone]   Fax:  [phone]  Name: Joelle Roswell MRN: [id number] Date of Birth: 1932-11-16

## 2020-03-12 ENCOUNTER — Encounter: Payer: Self-pay | Admitting: Physical Therapy

## 2020-03-12 ENCOUNTER — Other Ambulatory Visit: Payer: Self-pay

## 2020-03-12 ENCOUNTER — Ambulatory Visit: Payer: Medicare Other | Admitting: Physical Therapy

## 2020-03-12 DIAGNOSIS — R262 Difficulty in walking, not elsewhere classified: Secondary | ICD-10-CM

## 2020-03-12 DIAGNOSIS — R600 Localized edema: Secondary | ICD-10-CM

## 2020-03-12 DIAGNOSIS — M25561 Pain in right knee: Secondary | ICD-10-CM

## 2020-03-12 DIAGNOSIS — M25661 Stiffness of right knee, not elsewhere classified: Secondary | ICD-10-CM

## 2020-03-12 DIAGNOSIS — M62830 Muscle spasm of back: Secondary | ICD-10-CM

## 2020-03-12 DIAGNOSIS — M6281 Muscle weakness (generalized): Secondary | ICD-10-CM

## 2020-03-12 NOTE — Therapy (Signed)
[facility]. [place], [place], [address] Phone: [phone]   Fax:  [phone]  Physical Therapy Treatment  Patient Details  Name: [name] MRN: [id number] Date of Birth: 08-13-1932 Referring Provider (PT): Lucey   Encounter Date: 03/12/2020   PT End of Session - 03/12/20 1601    Visit Number 17    Date for PT Re-Evaluation 04/02/20    PT Start Time 4827    PT Stop Time 1557    PT Time Calculation (min) 42 min    Activity Tolerance Patient tolerated treatment well    Behavior During Therapy [facility] for tasks assessed/performed           Past Medical History:  Diagnosis Date  . Acalculous cholecystitis 08/24/2018  . Arthritis   . DVT of lower extremity (deep venous thrombosis) ([place])    right leg - after back surgery in 2019  . GERD (gastroesophageal reflux disease)   . History of blood transfusion   . Hypothyroidism   . PONV (postoperative nausea and vomiting)   . Ptosis of both eyelids   . Vertigo   . Wears glasses    reading    Past Surgical History:  Procedure Laterality Date  . ABDOMINAL HYSTERECTOMY  1986  . APPLICATION OF ROBOTIC ASSISTANCE FOR SPINAL PROCEDURE N/A 07/17/2017   Procedure: APPLICATION OF ROBOTIC ASSISTANCE FOR SPINAL PROCEDURE;  Surgeon: Kristeen Miss, MD;  Location: [facility];  Service: Neurosurgery;  Laterality: N/A;  . APPLICATION OF ROBOTIC ASSISTANCE FOR SPINAL PROCEDURE N/A 09/01/2017   Procedure: APPLICATION OF ROBOTIC ASSISTANCE FOR SPINAL PROCEDURE;  Surgeon: Kristeen Miss, MD;  Location: [place];  Service: Neurosurgery;  Laterality: N/A;  . [place]  2000   right  . CARPAL TUNNEL RELEASE  2012   left  . CHOLECYSTECTOMY N/A 08/24/2018   Procedure: LAPAROSCOPIC CHOLECYSTECTOMY WITH INTRAOPERATIVE CHOLANGIOGRAM;  Surgeon: Fanny Skates, MD;  Location: [facility];  Service: General;  Laterality: N/A;  . COLONOSCOPY    .  CYSTOCELE REPAIR  2007   sling  . DISTAL INTERPHALANGEAL JOINT FUSION Right 02/04/2014   Procedure: FUSION DISTAL INTERPHALANGEAL JOINT RIGHT INDEX FINGER ;  Surgeon: Daryll Brod, MD;  Location: [facility];  Service: Orthopedics;  Laterality: Right;  . EYE SURGERY Bilateral    Cataract surgery with lens implant  . KNEE SURGERY  2009,2011   partial knee  . LAPAROSCOPY N/A 08/24/2018   Procedure: LAPAROSCOPY DIAGNOSTIC;  Surgeon: Fanny Skates, MD;  Location: [facility];  Service: General;  Laterality: N/A;  . OPEN REDUCTION INTERNAL FIXATION (ORIF) PROXIMAL PHALANX Left 08/30/2013   Procedure: OPEN REDUCTION INTERNAL FIXATION (ORIF) PROXIMAL PHALANX FRACTURE LEFT SMALL FINGER; SPLINT RING FINGER;  Surgeon: Wynonia Sours, MD;  Location: [facility];  Service: Orthopedics;  Laterality: Left;  . POSTERIOR LUMBAR FUSION 4 LEVEL N/A 07/17/2017   Procedure: Thoracic Ten - Lumbar Five revison of hardware with Mazor;  Surgeon: Kristeen Miss, MD;  Location: [place];  Service: Neurosurgery;  Laterality: N/A;  Thoracic/Lumbar  . PTOSIS REPAIR Bilateral 07/27/2015   Procedure: PTOSIS REPAIR;  Surgeon: Cristine Polio, MD;  Location: [facility];  Service: Plastics;  Laterality: Bilateral;  . SHOULDER SURGERY     rt rcr,and lt  . TONSILLECTOMY    . TOTAL KNEE ARTHROPLASTY Right 12/23/2019   Procedure: TOTAL KNEE ARTHROPLASTY;  Surgeon: Vickey Huger, MD;  [facility];  Service: Orthopedics;  Laterality: Right;    There were no vitals filed for this visit.   Subjective Assessment - 03/12/20 1526    Subjective Saw knee surgeon, very pleased, we can finish out the month, she is still hurting in her back, will see the back surgeon tomorrow    Currently in Pain? Yes    Pain Score 6     Pain Location Back    Pain Orientation Mid    Pain Descriptors / Indicators Sore;Aching                             [facility] Adult PT Treatment/Exercise - 03/12/20 0001       Ambulation/Gait   Gait Comments 2 SPC's, cues for posture and heel strike      High Level Balance   High Level Balance Activities Side stepping;Backward walking    High Level Balance Comments on airex balance, then trying to reach, then looking side to side and then looking up. really difficult for her on the airex      Knee/Hip Exercises: Stretches   Gastroc Stretch 4 reps;20 seconds      Knee/Hip Exercises: Aerobic   Recumbent Bike 6 minuteslevel 2.5      Knee/Hip Exercises: Seated   Other Seated Knee/Hip Exercises seated DF, 90/90, straight leg DF      Manual Therapy   Soft tissue mobilization to the scar and then to the back due to her back pain                    PT Short Term Goals - 01/09/20 1520      PT SHORT TERM GOAL #1   Title independent with initial HEP    Status Achieved             PT Long Term Goals - 03/10/20 1641      PT LONG TERM GOAL #1   Title decrease pain 50%    Status Achieved      PT LONG TERM GOAL #2   Title increase AROM of the right knee to 6-115 degrees flexion    Status Achieved      PT LONG TERM GOAL #3   Title increase LE strength to 4+/5    Status Achieved      PT LONG TERM GOAL #4   Title go up and down stairs step over step    Status Partially Met      PT LONG TERM GOAL #5   Title decrease TUG to 19 seconds    Status Achieved                 Plan - 03/12/20 1603    Clinical Impression Statement Patient continues to have back pain, has spasms I tried some STM to this area and she was very sore but reported feeling better after, then we did the balance and the walking, she reported doing well until the end, then the back pain was coming back.  She really struggles with the dynamic surfaces    PT Next Visit Plan she will see the back surgeon tomorrow    Consulted and Agree with Plan of Care Patient           Patient will benefit from skilled therapeutic intervention in order to improve the  following deficits and impairments:  Abnormal gait,Decreased range of motion,Decreased balance,Decreased scar mobility,Impaired flexibility,Increased edema,Decreased strength,Decreased mobility,Decreased endurance,Pain  Visit Diagnosis: Difficulty in walking, not elsewhere classified  Acute pain of right knee  Localized edema  Stiffness of right knee, not elsewhere classified  Muscle weakness (generalized)  Muscle spasm of back     Problem List Patient Active Problem List   Diagnosis Date Noted  . S/P total knee arthroplasty, right 12/23/2019  . Acalculous cholecystitis 08/24/2018  . Osteoarthritis of right knee   . Vertigo   . DDD (degenerative disc disease), cervical   . Benign essential HTN   . History of DVT (deep vein thrombosis)   . Tachycardia   . Steroid-induced hyperglycemia   . Hypothyroidism   . Neuropathic pain   . Acute blood loss anemia   . S/P lumbar fusion   . Closed L5 vertebral fracture ([place]) 08/31/2017  . Closed fracture of fifth lumbar vertebra ([place]) 08/30/2017  . Lumbar vertebral fracture ([place]) 07/17/2017  . Scoliosis 06/13/2017    Sumner Boast., PT 03/12/2020, 4:05 PM  [facility]. [place], [place], [address] Phone: [phone]   Fax:  [phone]  Name: [name] MRN: [id number] Date of Birth: 10-Jul-1932

## 2020-03-17 ENCOUNTER — Ambulatory Visit: Payer: Medicare Other | Admitting: Physical Therapy

## 2020-03-18 ENCOUNTER — Ambulatory Visit: Payer: Medicare Other

## 2020-03-19 ENCOUNTER — Ambulatory Visit: Payer: Medicare Other | Admitting: Physical Therapy

## 2020-03-24 ENCOUNTER — Ambulatory Visit: Payer: Medicare Other | Admitting: Physical Therapy

## 2020-03-26 ENCOUNTER — Ambulatory Visit: Payer: Medicare Other | Admitting: Physical Therapy

## 2020-03-30 ENCOUNTER — Other Ambulatory Visit: Payer: Self-pay

## 2020-03-30 ENCOUNTER — Ambulatory Visit: Payer: Medicare Other | Admitting: Physical Therapy

## 2020-03-30 ENCOUNTER — Encounter: Payer: Self-pay | Admitting: Physical Therapy

## 2020-03-30 DIAGNOSIS — M545 Low back pain, unspecified: Secondary | ICD-10-CM

## 2020-03-30 DIAGNOSIS — M62830 Muscle spasm of back: Secondary | ICD-10-CM

## 2020-03-30 DIAGNOSIS — R262 Difficulty in walking, not elsewhere classified: Secondary | ICD-10-CM

## 2020-03-30 DIAGNOSIS — M6281 Muscle weakness (generalized): Secondary | ICD-10-CM

## 2020-03-30 NOTE — Therapy (Signed)
[facility] [address] [place], [place], [address] Phone: [phone]   Fax:  [phone]  Physical Therapy Evaluation  Patient Details  Name: [name] MRN: [id number] Date of Birth: April 26, 1932 Referring Provider (PT): Elsner   Encounter Date: 03/30/2020   PT End of Session - 03/30/20 1642    Visit Number 1    Date for PT Re-Evaluation 05/31/20    PT Start Time 1600    PT Stop Time 1640    PT Time Calculation (min) 40 min    Activity Tolerance Patient limited by pain    Behavior During Therapy [facility] for tasks assessed/performed           Past Medical History:  Diagnosis Date  . Acalculous cholecystitis 08/24/2018  . Arthritis   . DVT of lower extremity (deep venous thrombosis) (HCC)    right leg - after back surgery in 2019  . GERD (gastroesophageal reflux disease)   . History of blood transfusion   . Hypothyroidism   . PONV (postoperative nausea and vomiting)   . Ptosis of both eyelids   . Vertigo   . Wears glasses    reading    Past Surgical History:  Procedure Laterality Date  . ABDOMINAL HYSTERECTOMY  1986  . APPLICATION OF ROBOTIC ASSISTANCE FOR SPINAL PROCEDURE N/A 07/17/2017   Procedure: APPLICATION OF ROBOTIC ASSISTANCE FOR SPINAL PROCEDURE;  Surgeon: Barnett Abu, MD;  [facility]: [facility] OR;  Service: Neurosurgery;  Laterality: N/A;  . APPLICATION OF ROBOTIC ASSISTANCE FOR SPINAL PROCEDURE N/A 09/01/2017   Procedure: APPLICATION OF ROBOTIC ASSISTANCE FOR SPINAL PROCEDURE;  Surgeon: Barnett Abu, MD;  [facility]: [facility] OR;  Service: Neurosurgery;  Laterality: N/A;  . BACK SURGERY  1986   lumb lam  . CARPAL TUNNEL RELEASE  2000   right  . CARPAL TUNNEL RELEASE  2012   left  . CHOLECYSTECTOMY N/A 08/24/2018   Procedure: LAPAROSCOPIC CHOLECYSTECTOMY WITH INTRAOPERATIVE CHOLANGIOGRAM;  Surgeon: Claud Kelp, MD;  Location: [facility] OR;  Service: General;  Laterality: N/A;  . COLONOSCOPY    . CYSTOCELE  REPAIR  2007   sling  . DISTAL INTERPHALANGEAL JOINT FUSION Right 02/04/2014   Procedure: FUSION DISTAL INTERPHALANGEAL JOINT RIGHT INDEX FINGER ;  Surgeon: Cindee Salt, MD;  Location: [facility];  Service: Orthopedics;  Laterality: Right;  . EYE SURGERY Bilateral    Cataract surgery with lens implant  . KNEE SURGERY  2009,2011   partial knee  . LAPAROSCOPY N/A 08/24/2018   Procedure: LAPAROSCOPY DIAGNOSTIC;  Surgeon: Claud Kelp, MD;  Location: [facility] OR;  Service: General;  Laterality: N/A;  . OPEN REDUCTION INTERNAL FIXATION (ORIF) PROXIMAL PHALANX Left 08/30/2013   Procedure: OPEN REDUCTION INTERNAL FIXATION (ORIF) PROXIMAL PHALANX FRACTURE LEFT SMALL FINGER; SPLINT RING FINGER;  Surgeon: Nicki Reaper, MD;  Location: [facility];  Service: Orthopedics;  Laterality: Left;  . POSTERIOR LUMBAR FUSION 4 LEVEL N/A 07/17/2017   Procedure: Thoracic Ten - Lumbar Five revison of hardware with Mazor;  Surgeon: Barnett Abu, MD;  [facility];  Service: Neurosurgery;  Laterality: N/A;  Thoracic/Lumbar  . PTOSIS REPAIR Bilateral 07/27/2015   Procedure: PTOSIS REPAIR;  Surgeon: Louisa Second, MD;  Location:  SURGERY CENTER;  Service: Plastics;  Laterality: Bilateral;  . SHOULDER SURGERY     rt rcr,and lt  . TONSILLECTOMY    . TOTAL KNEE ARTHROPLASTY Right 12/23/2019   Procedure: TOTAL KNEE ARTHROPLASTY;  Surgeon: Dannielle Huh, MD;  Location: [facility];  Service: Orthopedics;  Laterality: Right;    There were no vitals filed for this visit.    Subjective Assessment - 03/30/20 1607    Subjective Paitent has a hx of 3 lumbar surgeries, fusion.  She had  a right TKA this past September, she had PT forthe knee and has done great and has been d/c'd by the surgeon, she had a cortisone injection in the buttock area for sciatica and did well for a few days but reports that all the pain moved to her back, MD feels like all of her pain is from the mm spasms, she was started  on Baclofen.    Limitations Lifting;Standing;Walking    How long can you stand comfortably? less than 2-3 minutes, has to sit to fix her hair    How long can you walk comfortably? 5 minutes due to pain    Patient Stated Goals have less back pain, move better, walk better    Currently in Pain? Yes    Pain Score 6     Pain Location Back    Pain Orientation Mid;Lower    Pain Descriptors / Indicators Aching;Spasm;Sore;Tightness    Pain Type Chronic pain    Pain Radiating Towards some nunmbness in the buttocks at times    Pain Onset More than a month ago    Pain Frequency Constant    Aggravating Factors  standing, walking pain up to 9-10/10    Pain Relieving Factors really has not founc much that helps started the baclofen and other pain medication with little relief, at best 6/10    Effect of Pain on Daily Activities limits all activities              [facility] PT Assessment - 03/30/20 0001      Assessment   Medical Diagnosis LBP s/p fusion    Referring Provider (PT) Elsner    Onset Date/Surgical Date 02/26/20    Prior Therapy for the knee      Precautions   Precautions None      Balance Screen   Has the patient fallen in the past 6 months No    Has the patient had a decrease in activity level because of a fear of falling?  No    Is the patient reluctant to leave their home because of a fear of falling?  No      Home Environment   Additional Comments split level home, needs to be able to do this, likes to do yardwork      Prior Function   Level of Independence Independent with community mobility with device    Vocation Retired    Leisure no exercise      AROM   Overall AROM Comments lumbar ROM is limited 50% flexion and side bending, shecannot get to neutral extension  due to pain in the back, limited 100% for lumbar extension      Strength   Overall Strength Comments 4-/5 for the LE's      Palpation   Palpation comment very tight and tender in the lumbar paraspinals and  into the buttock area      Transfers   Comments has to use her hands, struggles to get up from sitting, pain in the back with this and with standing, unable to straighten up      Ambulation/Gait   Gait Comments uses a FWW, very slow, stooped at the waist due to inability to straighten due to pain  Timed Up and Go Test   Normal TUG (seconds) 25    TUG Comments FWW                      Objective measurements completed on examination: See above findings.                 PT Short Term Goals - 03/30/20 1647      PT SHORT TERM GOAL #1   Title independent with initial HEP    Time 2    Period Weeks    Status New             PT Long Term Goals - 03/30/20 1647      PT LONG TERM GOAL #1   Title decrease pain 50%    Time 8    Period Weeks    Status New      PT LONG TERM GOAL #2   Title increase lumbar ROM 25% with less pain    Time 8    Period Weeks    Status New      PT LONG TERM GOAL #3   Title increase LE strength to 4+/5    Time 8    Period Weeks      PT LONG TERM GOAL #4   Title go up and down stairs step over step    Time 8    Period Weeks      PT LONG TERM GOAL #5   Title decrease TUG to 16 seconds    Time 8    Period Weeks    Status New                  Plan - 03/30/20 1642    Clinical Impression Statement Patient is referred to PT for LBP and spasms, she was seen up to December for a right TKA.  She has had 3 lumbar surgeries with fusion.  She did great with the TKA and has been released by the surgeon for the knee, she reports that she had been having LBP and had a cortisone injection that helped the hip pain but the back really started hurting much more.  The back surgeon feels that hte back is fine but the issue is mm spasms, she has significant tightnes sand tenderness in the lumbar parapsinals and into the buttocks, she cannot straighten up due to pain, has difficulty standing to comb hair due to pain, reports  unable to do any shopping due to pain.    Stability/Clinical Decision Making Evolving/Moderate complexity    Clinical Decision Making Low    Rehab Potential Good    PT Frequency 2x / week    PT Duration 8 weeks    PT Treatment/Interventions ADLs/Self Care Home Management;Cryotherapy;Gait training;Neuromuscular re-education;Balance training;Therapeutic exercise;Therapeutic activities;Functional mobility training;Stair training;Patient/family education;Manual techniques    PT Next Visit Plan start working on spasms    Consulted and Agree with Plan of Care Patient           Patient will benefit from skilled therapeutic intervention in order to improve the following deficits and impairments:  Abnormal gait,Decreased range of motion,Decreased balance,Decreased scar mobility,Impaired flexibility,Decreased strength,Decreased mobility,Decreased endurance,Pain,Cardiopulmonary status limiting activity,Decreased activity tolerance,Postural dysfunction,Improper body mechanics,Increased muscle spasms  Visit Diagnosis: Acute bilateral low back pain without sciatica - Plan: PT plan of care cert/re-cert  Muscle spasm of back - Plan: PT plan of care cert/re-cert  Muscle weakness (generalized) - Plan: PT plan of care cert/re-cert  Difficulty  in walking, not elsewhere classified - Plan: PT plan of care cert/re-cert     Problem List Patient Active Problem List   Diagnosis Date Noted  . S/P total knee arthroplasty, right 12/23/2019  . Acalculous cholecystitis 08/24/2018  . Osteoarthritis of right knee   . Vertigo   . DDD (degenerative disc disease), cervical   . Benign essential HTN   . History of DVT (deep vein thrombosis)   . Tachycardia   . Steroid-induced hyperglycemia   . Hypothyroidism   . Neuropathic pain   . Acute blood loss anemia   . S/P lumbar fusion   . Closed L5 vertebral fracture ([place]) 08/31/2017  . Closed fracture of fifth lumbar vertebra ([place]) 08/30/2017  . Lumbar vertebral  fracture ([place]) 07/17/2017  . Scoliosis 06/13/2017    Sumner Boast., PT 03/30/2020, 4:50 PM  [place] [place], [place], [address] Phone: [phone]   Fax:  [phone]  Name: [name] MRN: [id number] Date of Birth: [birth year]

## 2020-03-31 ENCOUNTER — Encounter: Payer: Medicare Other | Admitting: Physical Therapy

## 2020-03-31 ENCOUNTER — Ambulatory Visit
Admission: RE | Admit: 2020-03-31 | Discharge: 2020-03-31 | Disposition: A | Discharge status: Home / Self Care | Payer: Medicare Other | Source: Ambulatory Visit | Attending: Internal Medicine | Admitting: Internal Medicine

## 2020-03-31 DIAGNOSIS — Z1231 Encounter for screening mammogram for malignant neoplasm of breast: Secondary | ICD-10-CM

## 2020-04-02 ENCOUNTER — Encounter: Payer: Self-pay | Admitting: Physical Therapy

## 2020-04-02 ENCOUNTER — Other Ambulatory Visit: Payer: Self-pay

## 2020-04-02 ENCOUNTER — Ambulatory Visit: Payer: Medicare Other | Admitting: Physical Therapy

## 2020-04-02 DIAGNOSIS — M6281 Muscle weakness (generalized): Secondary | ICD-10-CM

## 2020-04-02 DIAGNOSIS — R262 Difficulty in walking, not elsewhere classified: Secondary | ICD-10-CM | POA: Diagnosis not present

## 2020-04-02 DIAGNOSIS — M62830 Muscle spasm of back: Secondary | ICD-10-CM

## 2020-04-02 DIAGNOSIS — M545 Low back pain, unspecified: Secondary | ICD-10-CM

## 2020-04-02 NOTE — Therapy (Signed)
[place]. [place], [place], [address] Phone: [phone]   Fax:  [phone]  Physical Therapy Treatment  Patient Details  Name: [name] MRN: [id number] Date of Birth: 1932/07/05 Referring Provider (PT): Elsner   Encounter Date: 04/02/2020   PT End of Session - 04/02/20 1639    Visit Number 2    Date for PT Re-Evaluation 05/31/20    PT Start Time [id number]    PT Stop Time 1637    PT Time Calculation (min) 43 min    Activity Tolerance Patient limited by pain    Behavior During Therapy [facility] for tasks assessed/performed           Past Medical History:  Diagnosis Date  . Acalculous cholecystitis 08/24/2018  . Arthritis   . DVT of lower extremity (deep venous thrombosis) ([place])    right leg - after back surgery in 2019  . GERD (gastroesophageal reflux disease)   . History of blood transfusion   . Hypothyroidism   . PONV (postoperative nausea and vomiting)   . Ptosis of both eyelids   . Vertigo   . Wears glasses    reading    Past Surgical History:  Procedure Laterality Date  . ABDOMINAL HYSTERECTOMY  1986  . APPLICATION OF ROBOTIC ASSISTANCE FOR SPINAL PROCEDURE N/A 07/17/2017   Procedure: APPLICATION OF ROBOTIC ASSISTANCE FOR SPINAL PROCEDURE;  Surgeon: Kristeen Miss, MD;  Location: [facility];  Service: Neurosurgery;  Laterality: N/A;  . APPLICATION OF ROBOTIC ASSISTANCE FOR SPINAL PROCEDURE N/A 09/01/2017   Procedure: APPLICATION OF ROBOTIC ASSISTANCE FOR SPINAL PROCEDURE;  Surgeon: Kristeen Miss, MD;  Location: [facility];  Service: Neurosurgery;  Laterality: N/A;  . [place]  2000   right  . CARPAL TUNNEL RELEASE  2012   left  . CHOLECYSTECTOMY N/A 08/24/2018   Procedure: LAPAROSCOPIC CHOLECYSTECTOMY WITH INTRAOPERATIVE CHOLANGIOGRAM;  Surgeon: Fanny Skates, MD;  Location: [facility];  Service: General;  Laterality: N/A;  . COLONOSCOPY    . CYSTOCELE  REPAIR  2007   sling  . DISTAL INTERPHALANGEAL JOINT FUSION Right 02/04/2014   Procedure: FUSION DISTAL INTERPHALANGEAL JOINT RIGHT INDEX FINGER ;  Surgeon: Daryll Brod, MD;  Location: [facility];  Service: Orthopedics;  Laterality: Right;  . EYE SURGERY Bilateral    Cataract surgery with lens implant  . KNEE SURGERY  2009,2011   partial knee  . LAPAROSCOPY N/A 08/24/2018   Procedure: LAPAROSCOPY DIAGNOSTIC;  Surgeon: Fanny Skates, MD;  Location: [facility];  Service: General;  Laterality: N/A;  . OPEN REDUCTION INTERNAL FIXATION (ORIF) PROXIMAL PHALANX Left 08/30/2013   Procedure: OPEN REDUCTION INTERNAL FIXATION (ORIF) PROXIMAL PHALANX FRACTURE LEFT SMALL FINGER; SPLINT RING FINGER;  Surgeon: Wynonia Sours, MD;  Location: [facility];  Service: Orthopedics;  Laterality: Left;  . POSTERIOR LUMBAR FUSION 4 LEVEL N/A 07/17/2017   Procedure: Thoracic Ten - Lumbar Five revison of hardware with Mazor;  Surgeon: Kristeen Miss, MD;  Location: [facility];  Service: Neurosurgery;  Laterality: N/A;  Thoracic/Lumbar  . PTOSIS REPAIR Bilateral 07/27/2015   Procedure: PTOSIS REPAIR;  Surgeon: Cristine Polio, MD;  Location: [facility];  Service: Plastics;  Laterality: Bilateral;  . SHOULDER SURGERY     rt rcr,and lt  . TONSILLECTOMY    . TOTAL KNEE ARTHROPLASTY Right 12/23/2019   Procedure: TOTAL KNEE ARTHROPLASTY;  Surgeon: Vickey Huger, MD;  Location: [facility];  Service: Orthopedics;  Laterality: Right;    There were no vitals filed for this visit.   Subjective Assessment - 04/02/20 1604    Subjective Patient reports feeling a little better, but still pain in the back with activities.  She reports that she feels like her balance is notvery good    Currently in Pain? Yes    Pain Score 5     Pain Location Back    Pain Orientation Mid;Lower    Aggravating Factors  heat feels good                             [facility] Adult PT Treatment/Exercise -  04/02/20 0001      Ambulation/Gait   Gait Comments used two SPC's for walking as this seems to help her posture with walking did 100' x 2      High Level Balance   High Level Balance Activities Side stepping;Backward walking    High Level Balance Comments 4" toe touches with one SPC      Knee/Hip Exercises: Aerobic   Recumbent Bike level 2 x 5 minutes    Nustep level 5 x 5 minutes      Knee/Hip Exercises: Seated   Other Seated Knee/Hip Exercises red tband rows and extnesion    Other Seated Knee/Hip Exercises on sit fit pelvic mobility      Manual Therapy   Soft tissue mobilization to the mid and low back focus on paraspinals with her sitting and leaned forward                    PT Short Term Goals - 03/30/20 1647      PT SHORT TERM GOAL #1   Title independent with initial HEP    Time 2    Period Weeks    Status New             PT Long Term Goals - 03/30/20 1647      PT LONG TERM GOAL #1   Title decrease pain 50%    Time 8    Period Weeks    Status New      PT LONG TERM GOAL #2   Title increase lumbar ROM 25% with less pain    Time 8    Period Weeks    Status New      PT LONG TERM GOAL #3   Title increase LE strength to 4+/5    Time 8    Period Weeks      PT LONG TERM GOAL #4   Title go up and down stairs step over step    Time 8    Period Weeks      PT LONG TERM GOAL #5   Title decrease TUG to 16 seconds    Time 8    Period Weeks    Status New                 Plan - 04/02/20 1640    Clinical Impression Statement Patient report that the [facility] felt really good.  She seemed to hold her posture better with using two canes, she does seem unsteady at times so I have asked her to not use the canes at home, she did report to me that she feels very good with the exercises and glad she is moving again, I told her it was okay to do the bike she has at home  5-10 minutes a day    PT Next Visit Plan continue to work on strength for the back and  the pain    Consulted and Agree with Plan of Care Patient           Patient will benefit from skilled therapeutic intervention in order to improve the following deficits and impairments:  Abnormal gait,Decreased range of motion,Decreased balance,Decreased scar mobility,Impaired flexibility,Decreased strength,Decreased mobility,Decreased endurance,Pain,Cardiopulmonary status limiting activity,Decreased activity tolerance,Postural dysfunction,Improper body mechanics,Increased muscle spasms  Visit Diagnosis: Acute bilateral low back pain without sciatica  Muscle spasm of back  Muscle weakness (generalized)  Difficulty in walking, not elsewhere classified     Problem List Patient Active Problem List   Diagnosis Date Noted  . S/P total knee arthroplasty, right 12/23/2019  . Acalculous cholecystitis 08/24/2018  . Osteoarthritis of right knee   . Vertigo   . DDD (degenerative disc disease), cervical   . Benign essential HTN   . History of DVT (deep vein thrombosis)   . Tachycardia   . Steroid-induced hyperglycemia   . Hypothyroidism   . Neuropathic pain   . Acute blood loss anemia   . S/P lumbar fusion   . Closed L5 vertebral fracture (HCC) 08/31/2017  . Closed fracture of fifth lumbar vertebra (HCC) 08/30/2017  . Lumbar vertebral fracture (HCC) 07/17/2017  . Scoliosis 06/13/2017    Jearld Lesch., PT 04/02/2020, 4:43 PM  [facility] [address] [place], [place], [address] Phone: [phone]   Fax:  [phone]  Name: [name] MRN: [id number] Date of Birth: 1932-12-28

## 2020-04-07 ENCOUNTER — Encounter: Payer: Self-pay | Admitting: Physical Therapy

## 2020-04-07 ENCOUNTER — Other Ambulatory Visit: Payer: Self-pay

## 2020-04-07 ENCOUNTER — Ambulatory Visit: Payer: Medicare Other | Attending: Orthopedic Surgery | Admitting: Physical Therapy

## 2020-04-07 DIAGNOSIS — M545 Low back pain, unspecified: Secondary | ICD-10-CM | POA: Insufficient documentation

## 2020-04-07 DIAGNOSIS — R262 Difficulty in walking, not elsewhere classified: Secondary | ICD-10-CM | POA: Insufficient documentation

## 2020-04-07 DIAGNOSIS — M6281 Muscle weakness (generalized): Secondary | ICD-10-CM | POA: Diagnosis present

## 2020-04-07 DIAGNOSIS — M25561 Pain in right knee: Secondary | ICD-10-CM | POA: Diagnosis present

## 2020-04-07 DIAGNOSIS — M62830 Muscle spasm of back: Secondary | ICD-10-CM | POA: Insufficient documentation

## 2020-04-07 NOTE — Therapy (Signed)
[place]. [place], [place], [address] Phone: [phone]   Fax:  [phone]  Physical Therapy Treatment  Patient Details  Name: [name] MRN: [id number] Date of Birth: July 30, 1932 Referring Provider (PT): Elsner   Encounter Date: 04/07/2020   PT End of Session - 04/07/20 1529    Visit Number 3    Date for PT Re-Evaluation 05/31/20    PT Start Time [id number]    PT Stop Time 1535    PT Time Calculation (min) 57 min    Activity Tolerance Patient limited by pain    Behavior During Therapy [facility] for tasks assessed/performed           Past Medical History:  Diagnosis Date  . Acalculous cholecystitis 08/24/2018  . Arthritis   . DVT of lower extremity (deep venous thrombosis) ([place])    right leg - after back surgery in 2019  . GERD (gastroesophageal reflux disease)   . History of blood transfusion   . Hypothyroidism   . PONV (postoperative nausea and vomiting)   . Ptosis of both eyelids   . Vertigo   . Wears glasses    reading    Past Surgical History:  Procedure Laterality Date  . ABDOMINAL HYSTERECTOMY  1986  . APPLICATION OF ROBOTIC ASSISTANCE FOR SPINAL PROCEDURE N/A 07/17/2017   Procedure: APPLICATION OF ROBOTIC ASSISTANCE FOR SPINAL PROCEDURE;  Surgeon: Kristeen Miss, MD;  Location: [place];  Service: Neurosurgery;  Laterality: N/A;  . APPLICATION OF ROBOTIC ASSISTANCE FOR SPINAL PROCEDURE N/A 09/01/2017   Procedure: APPLICATION OF ROBOTIC ASSISTANCE FOR SPINAL PROCEDURE;  Surgeon: Kristeen Miss, MD;  Location: [place];  Service: Neurosurgery;  Laterality: N/A;  . [place]  2000   right  . CARPAL TUNNEL RELEASE  2012   left  . CHOLECYSTECTOMY N/A 08/24/2018   Procedure: LAPAROSCOPIC CHOLECYSTECTOMY WITH INTRAOPERATIVE CHOLANGIOGRAM;  Surgeon: Fanny Skates, MD;  Location: [place];  Service: General;  Laterality: N/A;  . COLONOSCOPY    . CYSTOCELE  REPAIR  2007   sling  . DISTAL INTERPHALANGEAL JOINT FUSION Right 02/04/2014   Procedure: FUSION DISTAL INTERPHALANGEAL JOINT RIGHT INDEX FINGER ;  Surgeon: Daryll Brod, MD;  Location: [place];  Service: Orthopedics;  Laterality: Right;  . EYE SURGERY Bilateral    Cataract surgery with lens implant  . KNEE SURGERY  2009,2011   partial knee  . LAPAROSCOPY N/A 08/24/2018   Procedure: LAPAROSCOPY DIAGNOSTIC;  Surgeon: Fanny Skates, MD;  Location: [place];  Service: General;  Laterality: N/A;  . OPEN REDUCTION INTERNAL FIXATION (ORIF) PROXIMAL PHALANX Left 08/30/2013   Procedure: OPEN REDUCTION INTERNAL FIXATION (ORIF) PROXIMAL PHALANX FRACTURE LEFT SMALL FINGER; SPLINT RING FINGER;  Surgeon: Wynonia Sours, MD;  Location: [place];  Service: Orthopedics;  Laterality: Left;  . POSTERIOR LUMBAR FUSION 4 LEVEL N/A 07/17/2017   Procedure: Thoracic Ten - Lumbar Five revison of hardware with Mazor;  Surgeon: Kristeen Miss, MD;  Location: [place];  Service: Neurosurgery;  Laterality: N/A;  Thoracic/Lumbar  . PTOSIS REPAIR Bilateral 07/27/2015   Procedure: PTOSIS REPAIR;  Surgeon: Cristine Polio, MD;  Location: [place];  Service: Plastics;  Laterality: Bilateral;  . SHOULDER SURGERY     rt rcr,and lt  . TONSILLECTOMY    . TOTAL KNEE ARTHROPLASTY Right 12/23/2019   Procedure: TOTAL KNEE ARTHROPLASTY;  Surgeon: Vickey Huger, MD;  [facility];  Service: Orthopedics;  Laterality: Right;    There were no vitals filed for this visit.   Subjective Assessment - 04/07/20 1439    Subjective Reports that her back is feeling better still hurts    Currently in Pain? Yes    Pain Score 4     Pain Location Back    Pain Orientation Mid;Lower    Aggravating Factors  the treatment and the exercises seem to help                             [facility] Adult PT Treatment/Exercise - 04/07/20 0001      Ambulation/Gait   Gait Comments used two SPC's  for walking as this seems to help her posture with walking did 100' x 2      Knee/Hip Exercises: Aerobic   Recumbent Bike level 2 x 6 minutes    Nustep level 5 x 6 minutes      Knee/Hip Exercises: Seated   Other Seated Knee/Hip Exercises red tband rows and extension    Other Seated Knee/Hip Exercises on sit fit pelvic mobility, ball in lap isometric abs      Modalities   Modalities Electrical Stimulation      Electrical Stimulation   Electrical Stimulation Location to the mid and lower lumbar area    Electrical Stimulation Action IFC    Electrical Stimulation Parameters supine    Electrical Stimulation Goals Pain      Manual Therapy   Soft tissue mobilization to the mid and low back focus on paraspinals with her sitting and leaned forward                    PT Short Term Goals - 03/30/20 1647      PT SHORT TERM GOAL #1   Title independent with initial HEP    Time 2    Period Weeks    Status New             PT Long Term Goals - 03/30/20 1647      PT LONG TERM GOAL #1   Title decrease pain 50%    Time 8    Period Weeks    Status New      PT LONG TERM GOAL #2   Title increase lumbar ROM 25% with less pain    Time 8    Period Weeks    Status New      PT LONG TERM GOAL #3   Title increase LE strength to 4+/5    Time 8    Period Weeks      PT LONG TERM GOAL #4   Title go up and down stairs step over step    Time 8    Period Weeks      PT LONG TERM GOAL #5   Title decrease TUG to 16 seconds    Time 8    Period Weeks    Status New                 Plan - 04/07/20 1529    Clinical Impression Statement Patient is reporting that she feels better and is moving better.  She continues to be very tight and tender in the paraspinals.  She does well with walking with the two canes but her limit is about 115 feet before pain and then some tightness in the low back.  Starting to work on core.  PT Next Visit Plan continue to work on strength for  the back and the pain    Consulted and Agree with Plan of Care Patient           Patient will benefit from skilled therapeutic intervention in order to improve the following deficits and impairments:  Abnormal gait,Decreased range of motion,Decreased balance,Decreased scar mobility,Impaired flexibility,Decreased strength,Decreased mobility,Decreased endurance,Pain,Cardiopulmonary status limiting activity,Decreased activity tolerance,Postural dysfunction,Improper body mechanics,Increased muscle spasms  Visit Diagnosis: Acute bilateral low back pain without sciatica  Muscle spasm of back  Muscle weakness (generalized)  Difficulty in walking, not elsewhere classified     Problem List Patient Active Problem List   Diagnosis Date Noted  . S/P total knee arthroplasty, right 12/23/2019  . Acalculous cholecystitis 08/24/2018  . Osteoarthritis of right knee   . Vertigo   . DDD (degenerative disc disease), cervical   . Benign essential HTN   . History of DVT (deep vein thrombosis)   . Tachycardia   . Steroid-induced hyperglycemia   . Hypothyroidism   . Neuropathic pain   . Acute blood loss anemia   . S/P lumbar fusion   . Closed L5 vertebral fracture ([facility]) 08/31/2017  . Closed fracture of fifth lumbar vertebra (H[facility]) 08/30/2017  . Lumbar vertebral fracture (HCC) 07/17/2017  . Scoliosis 06/13/2017    Jearld Lesch., PT 04/07/2020, 3:38 PM  [facility] [address] [place], [place], [address] Phone: [phone]   Fax:  [phone]  Name: [name] MRN: [id number] Date of Birth: [birth year]

## 2020-04-09 ENCOUNTER — Other Ambulatory Visit: Payer: Self-pay

## 2020-04-09 ENCOUNTER — Ambulatory Visit: Payer: Medicare Other | Admitting: Physical Therapy

## 2020-04-09 ENCOUNTER — Encounter: Payer: Self-pay | Admitting: Physical Therapy

## 2020-04-09 DIAGNOSIS — M545 Low back pain, unspecified: Secondary | ICD-10-CM

## 2020-04-09 DIAGNOSIS — M62830 Muscle spasm of back: Secondary | ICD-10-CM

## 2020-04-09 DIAGNOSIS — R262 Difficulty in walking, not elsewhere classified: Secondary | ICD-10-CM

## 2020-04-09 DIAGNOSIS — M6281 Muscle weakness (generalized): Secondary | ICD-10-CM

## 2020-04-09 NOTE — Therapy (Signed)
[place]. [place], [place], [address] Phone: [phone]   Fax:  [phone]  Physical Therapy Treatment  Patient Details  Name: [name] MRN: [id number] Date of Birth: 12-08-1932 Referring Provider (PT): Elsner   Encounter Date: 04/09/2020   PT End of Session - 04/09/20 1650    Visit Number 4    Date for PT Re-Evaluation 05/31/20    PT Start Time [id number]    PT Stop Time 1702    PT Time Calculation (min) 56 min    Activity Tolerance Patient limited by pain    Behavior During Therapy [facility] for tasks assessed/performed           Past Medical History:  Diagnosis Date  . Acalculous cholecystitis 08/24/2018  . Arthritis   . DVT of lower extremity (deep venous thrombosis) ([place])    right leg - after back surgery in 2019  . GERD (gastroesophageal reflux disease)   . History of blood transfusion   . Hypothyroidism   . PONV (postoperative nausea and vomiting)   . Ptosis of both eyelids   . Vertigo   . Wears glasses    reading    Past Surgical History:  Procedure Laterality Date  . ABDOMINAL HYSTERECTOMY  1986  . APPLICATION OF ROBOTIC ASSISTANCE FOR SPINAL PROCEDURE N/A 07/17/2017   Procedure: APPLICATION OF ROBOTIC ASSISTANCE FOR SPINAL PROCEDURE;  Surgeon: Kristeen Miss, MD;  Location: [place];  Service: Neurosurgery;  Laterality: N/A;  . APPLICATION OF ROBOTIC ASSISTANCE FOR SPINAL PROCEDURE N/A 09/01/2017   Procedure: APPLICATION OF ROBOTIC ASSISTANCE FOR SPINAL PROCEDURE;  Surgeon: Kristeen Miss, MD;  Location: [place];  Service: Neurosurgery;  Laterality: N/A;  . [place]  2000   right  . CARPAL TUNNEL RELEASE  2012   left  . CHOLECYSTECTOMY N/A 08/24/2018   Procedure: LAPAROSCOPIC CHOLECYSTECTOMY WITH INTRAOPERATIVE CHOLANGIOGRAM;  Surgeon: Fanny Skates, MD;  Location: [place];  Service: General;  Laterality: N/A;  . COLONOSCOPY    . CYSTOCELE  REPAIR  2007   sling  . DISTAL INTERPHALANGEAL JOINT FUSION Right 02/04/2014   Procedure: FUSION DISTAL INTERPHALANGEAL JOINT RIGHT INDEX FINGER ;  Surgeon: Daryll Brod, MD;  Location: [place];  Service: Orthopedics;  Laterality: Right;  . EYE SURGERY Bilateral    Cataract surgery with lens implant  . KNEE SURGERY  2009,2011   partial knee  . LAPAROSCOPY N/A 08/24/2018   Procedure: LAPAROSCOPY DIAGNOSTIC;  Surgeon: Fanny Skates, MD;  Location: [place];  Service: General;  Laterality: N/A;  . OPEN REDUCTION INTERNAL FIXATION (ORIF) PROXIMAL PHALANX Left 08/30/2013   Procedure: OPEN REDUCTION INTERNAL FIXATION (ORIF) PROXIMAL PHALANX FRACTURE LEFT SMALL FINGER; SPLINT RING FINGER;  Surgeon: Wynonia Sours, MD;  Location: [place];  Service: Orthopedics;  Laterality: Left;  . POSTERIOR LUMBAR FUSION 4 LEVEL N/A 07/17/2017   Procedure: Thoracic Ten - Lumbar Five revison of hardware with Mazor;  Surgeon: Kristeen Miss, MD;  Location: [place];  Service: Neurosurgery;  Laterality: N/A;  Thoracic/Lumbar  . PTOSIS REPAIR Bilateral 07/27/2015   Procedure: PTOSIS REPAIR;  Surgeon: Cristine Polio, MD;  Location: [place];  Service: Plastics;  Laterality: Bilateral;  . SHOULDER SURGERY     rt rcr,and lt  . TONSILLECTOMY    . TOTAL KNEE ARTHROPLASTY Right 12/23/2019   Procedure: TOTAL KNEE ARTHROPLASTY;  Surgeon: Vickey Huger, MD;  [facility];  Service: Orthopedics;  Laterality: Right;    There were no vitals filed for this visit.   Subjective Assessment - 04/09/20 1608    Subjective I am startting to feel better, I was pretty active today and it did not bother me much    Currently in Pain? Yes    Pain Score 3     Pain Location Back    Pain Orientation Mid;Lower    Pain Descriptors / Indicators Aching;Sore                             [facility] Adult PT Treatment/Exercise - 04/09/20 0001      Ambulation/Gait   Gait Comments used  two SPC's for walking as this seems to help her posture with walking did 100' x 2, tried one can but after about 75 feet she really started to struggle.      High Level Balance   High Level Balance Activities Side stepping;Backward walking    High Level Balance Comments 4" toe touches with one SPC      Knee/Hip Exercises: Aerobic   Recumbent Bike level 2.5 x 6 minutes    Nustep level 5 x 6 minutes      Knee/Hip Exercises: Seated   Other Seated Knee/Hip Exercises red tband rows and extension    Other Seated Knee/Hip Exercises on sit fit pelvic mobility, ball in lap isometric abs      Modalities   Modalities Electrical Stimulation      Electrical Stimulation   Electrical Stimulation Location to the mid and lower lumbar area    Electrical Stimulation Action IFC    Electrical Stimulation Parameters sitting    Electrical Stimulation Goals Pain      Manual Therapy   Soft tissue mobilization to the mid and low back focus on paraspinals with her sitting and leaned forward                    PT Short Term Goals - 03/30/20 1647      PT SHORT TERM GOAL #1   Title independent with initial HEP    Time 2    Period Weeks    Status New             PT Long Term Goals - 04/09/20 1656      PT LONG TERM GOAL #1   Title decrease pain 50%    Status On-going      PT LONG TERM GOAL #2   Title increase lumbar ROM 25% with less pain    Status On-going      PT LONG TERM GOAL #3   Title increase LE strength to 4+/5    Status On-going      PT LONG TERM GOAL #4   Title go up and down stairs step over step    Status On-going      PT LONG TERM GOAL #5   Title decrease TUG to 16 seconds    Status On-going                 Plan - 04/09/20 1654    Clinical Impression Statement Patient continues to report feeling better, reports that she is doing some exercises at home. reports that she fatigues with activity, she was able to do laundry and some cleaning today without  much increase of pain.  With a SPC she struggles at about 75 feet. and tends to limp  with the leg and with some increased tightness in teh low back    PT Next Visit Plan continue to work on strength for the back and the pain    Consulted and Agree with Plan of Care Patient           Patient will benefit from skilled therapeutic intervention in order to improve the following deficits and impairments:  Abnormal gait,Decreased range of motion,Decreased balance,Decreased scar mobility,Impaired flexibility,Decreased strength,Decreased mobility,Decreased endurance,Pain,Cardiopulmonary status limiting activity,Decreased activity tolerance,Postural dysfunction,Improper body mechanics,Increased muscle spasms  Visit Diagnosis: Acute bilateral low back pain without sciatica  Muscle spasm of back  Muscle weakness (generalized)  Difficulty in walking, not elsewhere classified     Problem List Patient Active Problem List   Diagnosis Date Noted  . S/P total knee arthroplasty, right 12/23/2019  . Acalculous cholecystitis 08/24/2018  . Osteoarthritis of right knee   . Vertigo   . DDD (degenerative disc disease), cervical   . Benign essential HTN   . History of DVT (deep vein thrombosis)   . Tachycardia   . Steroid-induced hyperglycemia   . Hypothyroidism   . Neuropathic pain   . Acute blood loss anemia   . S/P lumbar fusion   . Closed L5 vertebral fracture (H[facility]) 08/31/2017  . Closed fracture of fifth lumbar vertebra (HCC) 08/30/2017  . Lumbar vertebral fracture (HCC) 07/17/2017  . Scoliosis 06/13/2017    Jearld Lesch., PT 04/09/2020, 5:17 PM  [facility] [address]. [place], [place], [address] Phone: [phone]   Fax:  [phone]  Name: [name] MRN: [id number] Date of Birth: November 03, 1932

## 2020-04-14 ENCOUNTER — Encounter: Payer: Self-pay | Admitting: Physical Therapy

## 2020-04-14 ENCOUNTER — Other Ambulatory Visit: Payer: Self-pay

## 2020-04-14 ENCOUNTER — Ambulatory Visit: Payer: Medicare Other | Admitting: Physical Therapy

## 2020-04-14 DIAGNOSIS — R262 Difficulty in walking, not elsewhere classified: Secondary | ICD-10-CM

## 2020-04-14 DIAGNOSIS — M545 Low back pain, unspecified: Secondary | ICD-10-CM

## 2020-04-14 DIAGNOSIS — M62830 Muscle spasm of back: Secondary | ICD-10-CM

## 2020-04-14 DIAGNOSIS — M6281 Muscle weakness (generalized): Secondary | ICD-10-CM

## 2020-04-14 NOTE — Therapy (Signed)
[facility] [address] [place], [place], [address] Phone: [phone]   Fax:  [phone]  Physical Therapy Treatment  Patient Details  Name: Samayah Novinger MRN: [id number] Date of Birth: [birth year] Referring Provider (PT): Elsner   Encounter Date: 04/14/2020   PT End of Session - 04/14/20 1526    Visit Number 5    Date for PT Re-Evaluation 05/31/20    PT Start Time 1440    PT Stop Time 1530    PT Time Calculation (min) 50 min    Activity Tolerance Patient tolerated treatment well    Behavior During Therapy [facility] for tasks assessed/performed           Past Medical History:  Diagnosis Date  . Acalculous cholecystitis 08/24/2018  . Arthritis   . DVT of lower extremity (deep venous thrombosis) ([facility])    right leg - after back surgery in 2019  . GERD (gastroesophageal reflux disease)   . History of blood transfusion   . Hypothyroidism   . PONV (postoperative nausea and vomiting)   . Ptosis of both eyelids   . Vertigo   . Wears glasses    reading    Past Surgical History:  Procedure Laterality Date  . ABDOMINAL HYSTERECTOMY  1986  . APPLICATION OF ROBOTIC ASSISTANCE FOR SPINAL PROCEDURE N/A 07/17/2017   Procedure: APPLICATION OF ROBOTIC ASSISTANCE FOR SPINAL PROCEDURE;  Surgeon: Barnett Abu, MD;  [facility];  Service: Neurosurgery;  Laterality: N/A;  . APPLICATION OF ROBOTIC ASSISTANCE FOR SPINAL PROCEDURE N/A 09/01/2017   Procedure: APPLICATION OF ROBOTIC ASSISTANCE FOR SPINAL PROCEDURE;  Surgeon: Barnett Abu, MD;  [facility];  Service: Neurosurgery;  Laterality: N/A;  . BACK SURGERY  1986   lumb lam  . CARPAL TUNNEL RELEASE  2000   right  . CARPAL TUNNEL RELEASE  2012   left  . CHOLECYSTECTOMY N/A 08/24/2018   Procedure: LAPAROSCOPIC CHOLECYSTECTOMY WITH INTRAOPERATIVE CHOLANGIOGRAM;  Surgeon: Claud Kelp, MD;  Location: [facility];  Service: General;  Laterality: N/A;  . COLONOSCOPY    .  CYSTOCELE REPAIR  2007   sling  . DISTAL INTERPHALANGEAL JOINT FUSION Right 02/04/2014   Procedure: FUSION DISTAL INTERPHALANGEAL JOINT RIGHT INDEX FINGER ;  Surgeon: Cindee Salt, MD;  Location: [facility];  Service: Orthopedics;  Laterality: Right;  . EYE SURGERY Bilateral    Cataract surgery with lens implant  . KNEE SURGERY  2009,2011   partial knee  . LAPAROSCOPY N/A 08/24/2018   Procedure: LAPAROSCOPY DIAGNOSTIC;  Surgeon: Claud Kelp, MD;  Location: [facility] OR;  Service: General;  Laterality: N/A;  . OPEN REDUCTION INTERNAL FIXATION (ORIF) PROXIMAL PHALANX Left 08/30/2013   Procedure: OPEN REDUCTION INTERNAL FIXATION (ORIF) PROXIMAL PHALANX FRACTURE LEFT SMALL FINGER; SPLINT RING FINGER;  Surgeon: Nicki Reaper, MD;  Location: [facility];  Service: Orthopedics;  Laterality: Left;  . POSTERIOR LUMBAR FUSION 4 LEVEL N/A 07/17/2017   Procedure: Thoracic Ten - Lumbar Five revison of hardware with Mazor;  Surgeon: Barnett Abu, MD;  [facility];  Service: Neurosurgery;  Laterality: N/A;  Thoracic/Lumbar  . PTOSIS REPAIR Bilateral 07/27/2015   Procedure: PTOSIS REPAIR;  Surgeon: Louisa Second, MD;  Location: [facility];  Service: Plastics;  Laterality: Bilateral;  . SHOULDER SURGERY     rt rcr,and lt  . TONSILLECTOMY    . TOTAL KNEE ARTHROPLASTY Right 12/23/2019   Procedure: TOTAL KNEE ARTHROPLASTY;  Surgeon: Dannielle Huh, MD;  Location: [facility];  Service: Orthopedics;  Laterality: Right;    There were no vitals filed for this visit.   Subjective Assessment - 04/14/20 1451    Subjective My back is feeling a little better    Currently in Pain? Yes    Pain Score 2     Pain Location Back    Pain Orientation Mid    Pain Descriptors / Indicators Sore;Spasm;Tightness    Aggravating Factors  I think this is helping                             [facility] Adult PT Treatment/Exercise - 04/14/20 0001      Ambulation/Gait   Gait  Comments used two SPC's, 80 feet, no issue, with SPC on the right she did really good, with the SPC on the left had issues with fatigue and pain i the back      High Level Balance   High Level Balance Comments 4" toe touches with one SPC      Knee/Hip Exercises: Aerobic   Recumbent Bike level 2.5 x 6 minutes    Nustep level 5 x 6 minutes      Knee/Hip Exercises: Machines for Strengthening   Cybex Knee Extension 5# 3x10    Cybex Knee Flexion 25# 3x10      Knee/Hip Exercises: Seated   Other Seated Knee/Hip Exercises red tband rows and extension on the sit fit    Other Seated Knee/Hip Exercises on sit fit pelvic mobility, ball in lap isometric abs, pelvic stability      Manual Therapy   Manual Therapy Soft tissue mobilization    Soft tissue mobilization to the mid and low back focus on paraspinals with her sitting and leaned forward                    PT Short Term Goals - 03/30/20 1647      PT SHORT TERM GOAL #1   Title independent with initial HEP    Time 2    Period Weeks    Status New             PT Long Term Goals - 04/09/20 1656      PT LONG TERM GOAL #1   Title decrease pain 50%    Status On-going      PT LONG TERM GOAL #2   Title increase lumbar ROM 25% with less pain    Status On-going      PT LONG TERM GOAL #3   Title increase LE strength to 4+/5    Status On-going      PT LONG TERM GOAL #4   Title go up and down stairs step over step    Status On-going      PT LONG TERM GOAL #5   Title decrease TUG to 16 seconds    Status On-going                 Plan - 04/14/20 1528    Clinical Impression Statement Patient did better today with the exercises, has a lot of difficulty with pelvic mobility, has difficulty with control.  She does great with 2 SPC's and did very good with one SPC in the left hand, however she likes to use in the right hand but this makes the gait pattern worse and she tends to fatigue easier    PT Next Visit Plan  continue to work on strength for  the back and the pain    Consulted and Agree with Plan of Care Patient           Patient will benefit from skilled therapeutic intervention in order to improve the following deficits and impairments:  Abnormal gait,Decreased range of motion,Decreased balance,Decreased scar mobility,Impaired flexibility,Decreased strength,Decreased mobility,Decreased endurance,Pain,Cardiopulmonary status limiting activity,Decreased activity tolerance,Postural dysfunction,Improper body mechanics,Increased muscle spasms  Visit Diagnosis: Acute bilateral low back pain without sciatica  Muscle spasm of back  Muscle weakness (generalized)  Difficulty in walking, not elsewhere classified     Problem List Patient Active Problem List   Diagnosis Date Noted  . S/P total knee arthroplasty, right 12/23/2019  . Acalculous cholecystitis 08/24/2018  . Osteoarthritis of right knee   . Vertigo   . DDD (degenerative disc disease), cervical   . Benign essential HTN   . History of DVT (deep vein thrombosis)   . Tachycardia   . Steroid-induced hyperglycemia   . Hypothyroidism   . Neuropathic pain   . Acute blood loss anemia   . S/P lumbar fusion   . Closed L5 vertebral fracture ([place]) 08/31/2017  . Closed fracture of fifth lumbar vertebra ([place]) 08/30/2017  . Lumbar vertebral fracture ([place]) 07/17/2017  . Scoliosis 06/13/2017    Sumner Boast., PT  04/14/2020, 3:30 PM  [place]. [place], [place], [address] Phone: [phone]   Fax:  [phone]  Name: Daisee Centner MRN: [id number] Date of Birth: [birth year]

## 2020-04-16 ENCOUNTER — Other Ambulatory Visit: Payer: Self-pay

## 2020-04-16 ENCOUNTER — Ambulatory Visit: Payer: Medicare Other | Admitting: Physical Therapy

## 2020-04-16 ENCOUNTER — Encounter: Payer: Self-pay | Admitting: Physical Therapy

## 2020-04-16 DIAGNOSIS — R262 Difficulty in walking, not elsewhere classified: Secondary | ICD-10-CM

## 2020-04-16 DIAGNOSIS — M545 Low back pain, unspecified: Secondary | ICD-10-CM

## 2020-04-16 DIAGNOSIS — M62830 Muscle spasm of back: Secondary | ICD-10-CM

## 2020-04-16 DIAGNOSIS — M6281 Muscle weakness (generalized): Secondary | ICD-10-CM

## 2020-04-16 NOTE — Therapy (Signed)
[place]. [place], [place], [address] Phone: [phone]   Fax:  [phone]  Physical Therapy Treatment  Patient Details  Name: [name] MRN: [id number] Date of Birth: 1933/02/11 Referring Provider (PT): Elsner   Encounter Date: 04/16/2020   PT End of Session - 04/16/20 1526    Visit Number 6    Date for PT Re-Evaluation 05/31/20    PT Start Time 0940    PT Stop Time 1533    PT Time Calculation (min) 55 min    Activity Tolerance Patient tolerated treatment well    Behavior During Therapy [facility] for tasks assessed/performed           Past Medical History:  Diagnosis Date  . Acalculous cholecystitis 08/24/2018  . Arthritis   . DVT of lower extremity (deep venous thrombosis) ([place])    right leg - after back surgery in 2019  . GERD (gastroesophageal reflux disease)   . History of blood transfusion   . Hypothyroidism   . PONV (postoperative nausea and vomiting)   . Ptosis of both eyelids   . Vertigo   . Wears glasses    reading    Past Surgical History:  Procedure Laterality Date  . ABDOMINAL HYSTERECTOMY  1986  . APPLICATION OF ROBOTIC ASSISTANCE FOR SPINAL PROCEDURE N/A 07/17/2017   Procedure: APPLICATION OF ROBOTIC ASSISTANCE FOR SPINAL PROCEDURE;  Surgeon: Kristeen Miss, MD;  Location: [place];  Service: Neurosurgery;  Laterality: N/A;  . APPLICATION OF ROBOTIC ASSISTANCE FOR SPINAL PROCEDURE N/A 09/01/2017   Procedure: APPLICATION OF ROBOTIC ASSISTANCE FOR SPINAL PROCEDURE;  Surgeon: Kristeen Miss, MD;  Location: [place];  Service: Neurosurgery;  Laterality: N/A;  . [place]  2000   right  . CARPAL TUNNEL RELEASE  2012   left  . CHOLECYSTECTOMY N/A 08/24/2018   Procedure: LAPAROSCOPIC CHOLECYSTECTOMY WITH INTRAOPERATIVE CHOLANGIOGRAM;  Surgeon: Fanny Skates, MD;  Location: [place];  Service: General;  Laterality: N/A;  . COLONOSCOPY    .  CYSTOCELE REPAIR  2007   sling  . DISTAL INTERPHALANGEAL JOINT FUSION Right 02/04/2014   Procedure: FUSION DISTAL INTERPHALANGEAL JOINT RIGHT INDEX FINGER ;  Surgeon: Daryll Brod, MD;  Location: [place];  Service: Orthopedics;  Laterality: Right;  . EYE SURGERY Bilateral    Cataract surgery with lens implant  . KNEE SURGERY  2009,2011   partial knee  . LAPAROSCOPY N/A 08/24/2018   Procedure: LAPAROSCOPY DIAGNOSTIC;  Surgeon: Fanny Skates, MD;  Location: [place];  Service: General;  Laterality: N/A;  . OPEN REDUCTION INTERNAL FIXATION (ORIF) PROXIMAL PHALANX Left 08/30/2013   Procedure: OPEN REDUCTION INTERNAL FIXATION (ORIF) PROXIMAL PHALANX FRACTURE LEFT SMALL FINGER; SPLINT RING FINGER;  Surgeon: Wynonia Sours, MD;  Location: [place];  Service: Orthopedics;  Laterality: Left;  . POSTERIOR LUMBAR FUSION 4 LEVEL N/A 07/17/2017   Procedure: Thoracic Ten - Lumbar Five revison of hardware with Mazor;  Surgeon: Kristeen Miss, MD;  Location: [place];  Service: Neurosurgery;  Laterality: N/A;  Thoracic/Lumbar  . PTOSIS REPAIR Bilateral 07/27/2015   Procedure: PTOSIS REPAIR;  Surgeon: Cristine Polio, MD;  Location: [place];  Service: Plastics;  Laterality: Bilateral;  . SHOULDER SURGERY     rt rcr,and lt  . TONSILLECTOMY    . TOTAL KNEE ARTHROPLASTY Right 12/23/2019   Procedure: TOTAL KNEE ARTHROPLASTY;  Surgeon: Vickey Huger, MD;  [facility];  Service: Orthopedics;  Laterality: Right;    There were no vitals filed for this visit.   Subjective Assessment - 04/16/20 1444    Subjective Back is feeling better, still hurtin, my knee is hurting again    Currently in Pain? Yes    Pain Score 3     Pain Location Back    Pain Orientation Mid;Lower    Pain Descriptors / Indicators Sore    Pain Relieving Factors "the treatment helped"                             [facility] Adult PT Treatment/Exercise - 04/16/20 0001       Ambulation/Gait   Gait Comments used two SPC's, 80 feet, no issue, with SPC on the right she did really good, with the SPC on the left had issues with fatigue and pain i the back, stairs step over step 4" and 6"      High Level Balance   High Level Balance Activities Side stepping;Backward walking    High Level Balance Comments 4" toe touches with one SPC, airex balance beam, airex standing, reaching and head turns      Knee/Hip Exercises: Aerobic   Nustep level 5 x 6 minutes      Knee/Hip Exercises: Seated   Other Seated Knee/Hip Exercises red tband rows and extension on the sit fit    Other Seated Knee/Hip Exercises on sit fit pelvic mobility, ball in lap isometric abs, pelvic stability, red tband      Manual Therapy   Manual Therapy Soft tissue mobilization    Soft tissue mobilization to the mid and low back focus on paraspinals with her sitting and leaned forward                    PT Short Term Goals - 04/16/20 1529      PT SHORT TERM GOAL #1   Title independent with initial HEP    Status Partially Met             PT Long Term Goals - 04/16/20 1529      PT LONG TERM GOAL #1   Title decrease pain 50%    Status On-going                 Plan - 04/16/20 1527    Clinical Impression Statement Continues to have difficulty with any pelvic mobility, she also sstruggles with balance especially dynamic surfaces, we tried stairs step over step, can do up but again needs CGA to go down step over step.  The mms of the thoracic and lumbar spine remain very tight   She reports that she has called the knee surgeon about the new pain in her knee    PT Next Visit Plan continue to work on strength for the back and the pain and balance    Consulted and Agree with Plan of Care Patient           Patient will benefit from skilled therapeutic intervention in order to improve the following deficits and impairments:  Abnormal gait,Decreased range of motion,Decreased  balance,Decreased scar mobility,Impaired flexibility,Decreased strength,Decreased mobility,Decreased endurance,Pain,Cardiopulmonary status limiting activity,Decreased activity tolerance,Postural dysfunction,Improper body mechanics,Increased muscle spasms  Visit Diagnosis: Acute bilateral low back pain without sciatica  Muscle spasm of back  Muscle weakness (generalized)  Difficulty in walking, not elsewhere classified     Problem List Patient Active Problem List  Diagnosis Date Noted  . S/P total knee arthroplasty, right 12/23/2019  . Acalculous cholecystitis 08/24/2018  . Osteoarthritis of right knee   . Vertigo   . DDD (degenerative disc disease), cervical   . Benign essential HTN   . History of DVT (deep vein thrombosis)   . Tachycardia   . Steroid-induced hyperglycemia   . Hypothyroidism   . Neuropathic pain   . Acute blood loss anemia   . S/P lumbar fusion   . Closed L5 vertebral fracture ([place]) 08/31/2017  . Closed fracture of fifth lumbar vertebra ([place]) 08/30/2017  . Lumbar vertebral fracture ([place]) 07/17/2017  . Scoliosis 06/13/2017    Sumner Boast., PT 04/16/2020, 3:30 PM  [place]. [place], [place], [address] Phone: [phone]   Fax:  [phone]  Name: [name] MRN: [id number] Date of Birth: July 02, 1932

## 2020-04-21 ENCOUNTER — Ambulatory Visit: Payer: Medicare Other | Admitting: Physical Therapy

## 2020-04-21 ENCOUNTER — Encounter: Payer: Self-pay | Admitting: Physical Therapy

## 2020-04-21 ENCOUNTER — Other Ambulatory Visit: Payer: Self-pay

## 2020-04-21 DIAGNOSIS — M545 Low back pain, unspecified: Secondary | ICD-10-CM | POA: Diagnosis not present

## 2020-04-21 DIAGNOSIS — M62830 Muscle spasm of back: Secondary | ICD-10-CM

## 2020-04-21 DIAGNOSIS — M25561 Pain in right knee: Secondary | ICD-10-CM

## 2020-04-21 DIAGNOSIS — R262 Difficulty in walking, not elsewhere classified: Secondary | ICD-10-CM

## 2020-04-21 DIAGNOSIS — M6281 Muscle weakness (generalized): Secondary | ICD-10-CM

## 2020-04-21 NOTE — Therapy (Signed)
[place]. [place], [place], [address] Phone: [phone]   Fax:  [phone]  Physical Therapy Treatment  Patient Details  Name: [name] MRN: [id number] Date of Birth: [birth year] Referring Provider (PT): Elsner   Encounter Date: 04/21/2020   PT End of Session - 04/21/20 1616    Visit Number 7    Date for PT Re-Evaluation 05/31/20    PT Start Time 1529    PT Stop Time 1620    PT Time Calculation (min) 51 min           Past Medical History:  Diagnosis Date  . Acalculous cholecystitis 08/24/2018  . Arthritis   . DVT of lower extremity (deep venous thrombosis) ([place])    right leg - after back surgery in 2019  . GERD (gastroesophageal reflux disease)   . History of blood transfusion   . Hypothyroidism   . PONV (postoperative nausea and vomiting)   . Ptosis of both eyelids   . Vertigo   . Wears glasses    reading    Past Surgical History:  Procedure Laterality Date  . ABDOMINAL HYSTERECTOMY  1986  . APPLICATION OF ROBOTIC ASSISTANCE FOR SPINAL PROCEDURE N/A 07/17/2017   Procedure: APPLICATION OF ROBOTIC ASSISTANCE FOR SPINAL PROCEDURE;  Surgeon: Kristeen Miss, MD;  Location: [place];  Service: Neurosurgery;  Laterality: N/A;  . APPLICATION OF ROBOTIC ASSISTANCE FOR SPINAL PROCEDURE N/A 09/01/2017   Procedure: APPLICATION OF ROBOTIC ASSISTANCE FOR SPINAL PROCEDURE;  Surgeon: Kristeen Miss, MD;  Location: [place];  Service: Neurosurgery;  Laterality: N/A;  . [place]  2000   right  . CARPAL TUNNEL RELEASE  2012   left  . CHOLECYSTECTOMY N/A 08/24/2018   Procedure: LAPAROSCOPIC CHOLECYSTECTOMY WITH INTRAOPERATIVE CHOLANGIOGRAM;  Surgeon: Fanny Skates, MD;  Location: [place];  Service: General;  Laterality: N/A;  . COLONOSCOPY    . CYSTOCELE REPAIR  2007   sling  . DISTAL INTERPHALANGEAL JOINT FUSION Right 02/04/2014   Procedure: FUSION DISTAL  INTERPHALANGEAL JOINT RIGHT INDEX FINGER ;  Surgeon: Daryll Brod, MD;  Location: [place];  Service: Orthopedics;  Laterality: Right;  . EYE SURGERY Bilateral    Cataract surgery with lens implant  . KNEE SURGERY  2009,2011   partial knee  . LAPAROSCOPY N/A 08/24/2018   Procedure: LAPAROSCOPY DIAGNOSTIC;  Surgeon: Fanny Skates, MD;  Location: [place];  Service: General;  Laterality: N/A;  . OPEN REDUCTION INTERNAL FIXATION (ORIF) PROXIMAL PHALANX Left 08/30/2013   Procedure: OPEN REDUCTION INTERNAL FIXATION (ORIF) PROXIMAL PHALANX FRACTURE LEFT SMALL FINGER; SPLINT RING FINGER;  Surgeon: Wynonia Sours, MD;  Location: [place];  Service: Orthopedics;  Laterality: Left;  . POSTERIOR LUMBAR FUSION 4 LEVEL N/A 07/17/2017   Procedure: Thoracic Ten - Lumbar Five revison of hardware with Mazor;  Surgeon: Kristeen Miss, MD;  Location: [place];  Service: Neurosurgery;  Laterality: N/A;  Thoracic/Lumbar  . PTOSIS REPAIR Bilateral 07/27/2015   Procedure: PTOSIS REPAIR;  Surgeon: Cristine Polio, MD;  Location: [place];  Service: Plastics;  Laterality: Bilateral;  . SHOULDER SURGERY     rt rcr,and lt  . TONSILLECTOMY    . TOTAL KNEE ARTHROPLASTY Right 12/23/2019   Procedure: TOTAL KNEE ARTHROPLASTY;  Surgeon: Vickey Huger, MD;  [facility]: [facility];  Service: Orthopedics;  Laterality: Right;    There were no vitals filed for this visit.  Subjective Assessment - 04/21/20 1533    Subjective I am doing a little better, saw the knee surgeon, feels like it is nothing maybe scar tissue    Currently in Pain? Yes    Pain Score 4     Pain Location Back    Pain Orientation Left;Mid;Lower    Aggravating Factors  walking                             [facility] Adult PT Treatment/Exercise - 04/21/20 0001      Ambulation/Gait   Gait Comments two SPC walking 115' x 2 without much difficulty with limping or back pain      Knee/Hip Exercises: Aerobic    Recumbent Bike level 1 x 2 minutes    Nustep level 5 x 6 minutes      Knee/Hip Exercises: Seated   Other Seated Knee/Hip Exercises red tband rows and extension on the sit fit    Other Seated Knee/Hip Exercises on sit fit pelvic mobility, ball in lap isometric abs, pelvic stability, red tband, yellow tband rainght ankle motions all 2x15 each, some eccentric DF due to this being very weak      Manual Therapy   Manual Therapy Soft tissue mobilization    Soft tissue mobilization to the mid and low back focus on paraspinals with her sitting and leaned forward                    PT Short Term Goals - 04/16/20 1529      PT SHORT TERM GOAL #1   Title independent with initial HEP    Status Partially Met             PT Long Term Goals - 04/16/20 1529      PT LONG TERM GOAL #1   Title decrease pain 50%    Status On-going                 Plan - 04/21/20 1617    Clinical Impression Statement Much improved pelvic mobility, less cues for the motions, working on the right ankle ROM and function, she is weak for DF and inversion/eversion.  The walking with the two SPC's is much improved, less limp and less c/o back pain, she still is off balance at times, The lumbar mms are still very tight and tender, seems to be spasms    PT Next Visit Plan continue to work on strength for the back and the pain and balance    Consulted and Agree with Plan of Care Patient           Patient will benefit from skilled therapeutic intervention in order to improve the following deficits and impairments:  Abnormal gait,Decreased range of motion,Decreased balance,Decreased scar mobility,Impaired flexibility,Decreased strength,Decreased mobility,Decreased endurance,Pain,Cardiopulmonary status limiting activity,Decreased activity tolerance,Postural dysfunction,Improper body mechanics,Increased muscle spasms  Visit Diagnosis: Acute bilateral low back pain without sciatica  Muscle spasm of  back  Muscle weakness (generalized)  Difficulty in walking, not elsewhere classified  Acute pain of right knee     Problem List Patient Active Problem List   Diagnosis Date Noted  . S/P total knee arthroplasty, right 12/23/2019  . Acalculous cholecystitis 08/24/2018  . Osteoarthritis of right knee   . Vertigo   . DDD (degenerative disc disease), cervical   . Benign essential HTN   . History of DVT (deep vein thrombosis)   . Tachycardia   . Steroid-induced  hyperglycemia   . Hypothyroidism   . Neuropathic pain   . Acute blood loss anemia   . S/P lumbar fusion   . Closed L5 vertebral fracture ([place]) 08/31/2017  . Closed fracture of fifth lumbar vertebra ([place]) 08/30/2017  . Lumbar vertebral fracture ([place]) 07/17/2017  . Scoliosis 06/13/2017    Sumner Boast., PT 04/21/2020, 4:22 PM  [place]. [place], [place], [address] Phone: [phone]   Fax:  [phone]  Name: [name] MRN: [id number] Date of Birth: 16-Sep-1932

## 2020-04-23 ENCOUNTER — Ambulatory Visit: Payer: Medicare Other | Admitting: Physical Therapy

## 2020-04-23 ENCOUNTER — Other Ambulatory Visit: Payer: Self-pay

## 2020-04-23 ENCOUNTER — Encounter: Payer: Self-pay | Admitting: Physical Therapy

## 2020-04-23 DIAGNOSIS — R262 Difficulty in walking, not elsewhere classified: Secondary | ICD-10-CM

## 2020-04-23 DIAGNOSIS — M545 Low back pain, unspecified: Secondary | ICD-10-CM

## 2020-04-23 DIAGNOSIS — M62830 Muscle spasm of back: Secondary | ICD-10-CM

## 2020-04-23 DIAGNOSIS — M6281 Muscle weakness (generalized): Secondary | ICD-10-CM

## 2020-04-23 NOTE — Therapy (Signed)
[place]. [place], [place], [address] Phone: [phone]   Fax:  [phone]  Physical Therapy Treatment  Patient Details  Name: [name] MRN: [id number] Date of Birth: May 05, 1932 Referring Provider (PT): Elsner   Encounter Date: 04/23/2020   PT End of Session - 04/23/20 1700    Visit Number 8    Date for PT Re-Evaluation 05/31/20    PT Start Time 1559    PT Stop Time 9937    PT Time Calculation (min) 59 min    Activity Tolerance Patient tolerated treatment well    Behavior During Therapy [facility] for tasks assessed/performed           Past Medical History:  Diagnosis Date  . Acalculous cholecystitis 08/24/2018  . Arthritis   . DVT of lower extremity (deep venous thrombosis) ([place])    right leg - after back surgery in 2019  . GERD (gastroesophageal reflux disease)   . History of blood transfusion   . Hypothyroidism   . PONV (postoperative nausea and vomiting)   . Ptosis of both eyelids   . Vertigo   . Wears glasses    reading    Past Surgical History:  Procedure Laterality Date  . ABDOMINAL HYSTERECTOMY  1986  . APPLICATION OF ROBOTIC ASSISTANCE FOR SPINAL PROCEDURE N/A 07/17/2017   Procedure: APPLICATION OF ROBOTIC ASSISTANCE FOR SPINAL PROCEDURE;  Surgeon: Kristeen Miss, MD;  Location: [place];  Service: Neurosurgery;  Laterality: N/A;  . APPLICATION OF ROBOTIC ASSISTANCE FOR SPINAL PROCEDURE N/A 09/01/2017   Procedure: APPLICATION OF ROBOTIC ASSISTANCE FOR SPINAL PROCEDURE;  Surgeon: Kristeen Miss, MD;  Location: [place];  Service: Neurosurgery;  Laterality: N/A;  . [place]  2000   right  . CARPAL TUNNEL RELEASE  2012   left  . CHOLECYSTECTOMY N/A 08/24/2018   Procedure: LAPAROSCOPIC CHOLECYSTECTOMY WITH INTRAOPERATIVE CHOLANGIOGRAM;  Surgeon: Fanny Skates, MD;  Location: [place];  Service: General;  Laterality: N/A;  . COLONOSCOPY    .  CYSTOCELE REPAIR  2007   sling  . DISTAL INTERPHALANGEAL JOINT FUSION Right 02/04/2014   Procedure: FUSION DISTAL INTERPHALANGEAL JOINT RIGHT INDEX FINGER ;  Surgeon: Daryll Brod, MD;  Location: [place];  Service: Orthopedics;  Laterality: Right;  . EYE SURGERY Bilateral    Cataract surgery with lens implant  . KNEE SURGERY  2009,2011   partial knee  . LAPAROSCOPY N/A 08/24/2018   Procedure: LAPAROSCOPY DIAGNOSTIC;  Surgeon: Fanny Skates, MD;  Location: [place];  Service: General;  Laterality: N/A;  . OPEN REDUCTION INTERNAL FIXATION (ORIF) PROXIMAL PHALANX Left 08/30/2013   Procedure: OPEN REDUCTION INTERNAL FIXATION (ORIF) PROXIMAL PHALANX FRACTURE LEFT SMALL FINGER; SPLINT RING FINGER;  Surgeon: Wynonia Sours, MD;  Location: [place];  Service: Orthopedics;  Laterality: Left;  . POSTERIOR LUMBAR FUSION 4 LEVEL N/A 07/17/2017   Procedure: Thoracic Ten - Lumbar Five revison of hardware with Mazor;  Surgeon: Kristeen Miss, MD;  Location: [place];  Service: Neurosurgery;  Laterality: N/A;  Thoracic/Lumbar  . PTOSIS REPAIR Bilateral 07/27/2015   Procedure: PTOSIS REPAIR;  Surgeon: Cristine Polio, MD;  Location: [place];  Service: Plastics;  Laterality: Bilateral;  . SHOULDER SURGERY     rt rcr,and lt  . TONSILLECTOMY    . TOTAL KNEE ARTHROPLASTY Right 12/23/2019   Procedure: TOTAL KNEE ARTHROPLASTY;  Surgeon: Vickey Huger, MD;  [facility];  Service: Orthopedics;  Laterality: Right;    There were no vitals filed for this visit.   Subjective Assessment - 04/23/20 1600    Subjective Less knee pain, still with c/o the left low back pain    Currently in Pain? Yes    Pain Score 4     Pain Location Back    Pain Orientation Left;Mid    Pain Descriptors / Indicators Sore;Spasm;Tightness    Pain Relieving Factors the treatment really helps for a while                             [facility] Adult PT Treatment/Exercise - 04/23/20  0001      Ambulation/Gait   Gait Comments two SPC walking 115' x 2 without much difficulty with limping or back pain      Knee/Hip Exercises: Aerobic   Recumbent Bike level 1 x 2 minutes, had to stop due to a sharp pulling in the left anterior knee    Nustep level 5 x 6 minutes      Knee/Hip Exercises: Machines for Strengthening   Other Machine standing 5# row and extension      Knee/Hip Exercises: Seated   Other Seated Knee/Hip Exercises on sit fit pelvic mobility, ball in lap isometric abs, pelvic stability, red tband, yellow tband rainght ankle motions all 2x15 each, some eccentric DF due to this being very weak      Modalities   Modalities Electrical engineer Stimulation Location left mid/low back    Electrical Stimulation Action IFC    Electrical Stimulation Parameters sitting    Electrical Stimulation Goals Pain      Manual Therapy   Manual Therapy Soft tissue mobilization    Soft tissue mobilization to the mid and low back focus on paraspinals with her sitting and leaned forward                    PT Short Term Goals - 04/16/20 1529      PT SHORT TERM GOAL #1   Title independent with initial HEP    Status Partially Met             PT Long Term Goals - 04/23/20 1710      PT LONG TERM GOAL #1   Title decrease pain 50%    Status On-going      PT LONG TERM GOAL #2   Title increase lumbar ROM 25% with less pain    Status On-going                 Plan - 04/23/20 1705    Clinical Impression Statement Patient still very tight in the low back, she is reporting that she is doing better but still with a high rating of pain and with a lot of mm tightness, all is on the left, she feels like our treatment is good, she iswalking with the two canes very well but still has times where he loses her balance especially when distracted, the right knee hurt on the bike so I do think we need to avoid this    PT Next  Visit Plan continue to work on strength for the back and the pain and balance    Consulted and Agree with Plan of Care Patient           Patient will benefit from skilled therapeutic intervention  in order to improve the following deficits and impairments:  Abnormal gait,Decreased range of motion,Decreased balance,Decreased scar mobility,Impaired flexibility,Decreased strength,Decreased mobility,Decreased endurance,Pain,Cardiopulmonary status limiting activity,Decreased activity tolerance,Postural dysfunction,Improper body mechanics,Increased muscle spasms  Visit Diagnosis: Acute bilateral low back pain without sciatica  Muscle spasm of back  Muscle weakness (generalized)  Difficulty in walking, not elsewhere classified     Problem List Patient Active Problem List   Diagnosis Date Noted  . S/P total knee arthroplasty, right 12/23/2019  . Acalculous cholecystitis 08/24/2018  . Osteoarthritis of right knee   . Vertigo   . DDD (degenerative disc disease), cervical   . Benign essential HTN   . History of DVT (deep vein thrombosis)   . Tachycardia   . Steroid-induced hyperglycemia   . Hypothyroidism   . Neuropathic pain   . Acute blood loss anemia   . S/P lumbar fusion   . Closed L5 vertebral fracture ([place]) 08/31/2017  . Closed fracture of fifth lumbar vertebra ([place]) 08/30/2017  . Lumbar vertebral fracture ([place]) 07/17/2017  . Scoliosis 06/13/2017    Sumner Boast., PT 04/23/2020, 5:11 PM  [place]. [place], [place], [address] Phone: [phone]   Fax:  [phone]  Name: KARYS MECKLEY MRN: [id number] Date of Birth: Sep 02, 1932

## 2020-04-28 ENCOUNTER — Encounter: Payer: Self-pay | Admitting: Physical Therapy

## 2020-04-28 ENCOUNTER — Ambulatory Visit: Payer: Medicare Other | Admitting: Physical Therapy

## 2020-04-28 ENCOUNTER — Other Ambulatory Visit: Payer: Self-pay

## 2020-04-28 DIAGNOSIS — R262 Difficulty in walking, not elsewhere classified: Secondary | ICD-10-CM

## 2020-04-28 DIAGNOSIS — M62830 Muscle spasm of back: Secondary | ICD-10-CM

## 2020-04-28 DIAGNOSIS — M545 Low back pain, unspecified: Secondary | ICD-10-CM

## 2020-04-28 DIAGNOSIS — M6281 Muscle weakness (generalized): Secondary | ICD-10-CM

## 2020-04-28 NOTE — Therapy (Signed)
[place]. [place], [place], [address] Phone: [phone]   Fax:  [phone]  Physical Therapy Treatment  Patient Details  Name: [name] MRN: [id number] Date of Birth: [birth year] Referring Provider (PT): Elsner   Encounter Date: 04/28/2020   PT End of Session - 04/28/20 1650    Visit Number 9    Date for PT Re-Evaluation 05/31/20    PT Start Time 4235    PT Stop Time 1659    PT Time Calculation (min) 50 min    Activity Tolerance Patient tolerated treatment well    Behavior During Therapy [facility] for tasks assessed/performed           Past Medical History:  Diagnosis Date  . Acalculous cholecystitis 08/24/2018  . Arthritis   . DVT of lower extremity (deep venous thrombosis) ([place])    right leg - after back surgery in 2019  . GERD (gastroesophageal reflux disease)   . History of blood transfusion   . Hypothyroidism   . PONV (postoperative nausea and vomiting)   . Ptosis of both eyelids   . Vertigo   . Wears glasses    reading    Past Surgical History:  Procedure Laterality Date  . ABDOMINAL HYSTERECTOMY  1986  . APPLICATION OF ROBOTIC ASSISTANCE FOR SPINAL PROCEDURE N/A 07/17/2017   Procedure: APPLICATION OF ROBOTIC ASSISTANCE FOR SPINAL PROCEDURE;  Surgeon: Kristeen Miss, MD;  Location: [place];  Service: Neurosurgery;  Laterality: N/A;  . APPLICATION OF ROBOTIC ASSISTANCE FOR SPINAL PROCEDURE N/A 09/01/2017   Procedure: APPLICATION OF ROBOTIC ASSISTANCE FOR SPINAL PROCEDURE;  Surgeon: Kristeen Miss, MD;  Location: [place];  Service: Neurosurgery;  Laterality: N/A;  . [place]  2000   right  . CARPAL TUNNEL RELEASE  2012   left  . CHOLECYSTECTOMY N/A 08/24/2018   Procedure: LAPAROSCOPIC CHOLECYSTECTOMY WITH INTRAOPERATIVE CHOLANGIOGRAM;  Surgeon: Fanny Skates, MD;  Location: [place];  Service: General;  Laterality: N/A;  . COLONOSCOPY    .  CYSTOCELE REPAIR  2007   sling  . DISTAL INTERPHALANGEAL JOINT FUSION Right 02/04/2014   Procedure: FUSION DISTAL INTERPHALANGEAL JOINT RIGHT INDEX FINGER ;  Surgeon: Daryll Brod, MD;  Location: [place];  Service: Orthopedics;  Laterality: Right;  . EYE SURGERY Bilateral    Cataract surgery with lens implant  . KNEE SURGERY  2009,2011   partial knee  . LAPAROSCOPY N/A 08/24/2018   Procedure: LAPAROSCOPY DIAGNOSTIC;  Surgeon: Fanny Skates, MD;  Location: [place];  Service: General;  Laterality: N/A;  . OPEN REDUCTION INTERNAL FIXATION (ORIF) PROXIMAL PHALANX Left 08/30/2013   Procedure: OPEN REDUCTION INTERNAL FIXATION (ORIF) PROXIMAL PHALANX FRACTURE LEFT SMALL FINGER; SPLINT RING FINGER;  Surgeon: Wynonia Sours, MD;  Location: [place];  Service: Orthopedics;  Laterality: Left;  . POSTERIOR LUMBAR FUSION 4 LEVEL N/A 07/17/2017   Procedure: Thoracic Ten - Lumbar Five revison of hardware with Mazor;  Surgeon: Kristeen Miss, MD;  Location: [place];  Service: Neurosurgery;  Laterality: N/A;  Thoracic/Lumbar  . PTOSIS REPAIR Bilateral 07/27/2015   Procedure: PTOSIS REPAIR;  Surgeon: Cristine Polio, MD;  Location: [place];  Service: Plastics;  Laterality: Bilateral;  . SHOULDER SURGERY     rt rcr,and lt  . TONSILLECTOMY    . TOTAL KNEE ARTHROPLASTY Right 12/23/2019   Procedure: TOTAL KNEE ARTHROPLASTY;  Surgeon: Vickey Huger, MD;  [facility];  Service: Orthopedics;  Laterality: Right;    There were no vitals filed for this visit.   Subjective Assessment - 04/28/20 1615    Subjective Reports that she woke up with more pain today, not sure why, reports that she did not do anything yesterday    Currently in Pain? Yes    Pain Score 6     Pain Location Back    Pain Orientation Left;Mid    Aggravating Factors  wake up with pain                             [facility] Adult PT Treatment/Exercise - 04/28/20 0001       Ambulation/Gait   Gait Comments two SPC walking 115' x 2 without much difficulty with limping or back pain, with one cane about 60 feet, having more limp      Knee/Hip Exercises: Seated   Ball Squeeze 20    Other Seated Knee/Hip Exercises on sit fit pelvic mobility, ball in lap isometric abs, pelvic stability, red tband, yellow tband rainght ankle motions all 2x15 each, some eccentric DF due to this being very weak      Manual Therapy   Manual Therapy Soft tissue mobilization    Soft tissue mobilization to the mid and low back focus on paraspinals with her sitting and leaned forward mostly left side                    PT Short Term Goals - 04/16/20 1529      PT SHORT TERM GOAL #1   Title independent with initial HEP    Status Partially Met             PT Long Term Goals - 04/23/20 1710      PT LONG TERM GOAL #1   Title decrease pain 50%    Status On-going      PT LONG TERM GOAL #2   Title increase lumbar ROM 25% with less pain    Status On-going                 Plan - 04/28/20 1651    Clinical Impression Statement Patient reporting increased pain today, she is unsure of why, she reports that she really did nothing yesterday.  She does great with the two canes, with distraction does seem to lose her balance or is a little more unsafe.  She had a little better control of the pelvic motions today.  She feels that the [facility] feels good and helps.  I would like to build some strength and see if this helps the back.    PT Next Visit Plan continue to work on strength for the back and the pain and balance    Consulted and Agree with Plan of Care Patient           Patient will benefit from skilled therapeutic intervention in order to improve the following deficits and impairments:  Abnormal gait,Decreased range of motion,Decreased balance,Decreased scar mobility,Impaired flexibility,Decreased strength,Decreased mobility,Decreased endurance,Pain,Cardiopulmonary status  limiting activity,Decreased activity tolerance,Postural dysfunction,Improper body mechanics,Increased muscle spasms  Visit Diagnosis: Acute bilateral low back pain without sciatica  Muscle spasm of back  Muscle weakness (generalized)  Difficulty in walking, not elsewhere classified     Problem List Patient Active Problem List   Diagnosis Date Noted  . S/P total knee arthroplasty, right 12/23/2019  . Acalculous cholecystitis 08/24/2018  . Osteoarthritis of right  knee   . Vertigo   . DDD (degenerative disc disease), cervical   . Benign essential HTN   . History of DVT (deep vein thrombosis)   . Tachycardia   . Steroid-induced hyperglycemia   . Hypothyroidism   . Neuropathic pain   . Acute blood loss anemia   . S/P lumbar fusion   . Closed L5 vertebral fracture ([place]) 08/31/2017  . Closed fracture of fifth lumbar vertebra ([place]) 08/30/2017  . Lumbar vertebral fracture ([place]) 07/17/2017  . Scoliosis 06/13/2017    Sumner Boast., PT 04/28/2020, 4:55 PM  [place]. [place], [place], [address] Phone: [phone]   Fax:  [phone]  Name: GARDENIA WITTER MRN: [id number] Date of Birth: 1933-03-19

## 2020-04-30 ENCOUNTER — Encounter: Payer: Self-pay | Admitting: Physical Therapy

## 2020-04-30 ENCOUNTER — Other Ambulatory Visit: Payer: Self-pay

## 2020-04-30 ENCOUNTER — Ambulatory Visit: Payer: Medicare Other | Admitting: Physical Therapy

## 2020-04-30 DIAGNOSIS — M62830 Muscle spasm of back: Secondary | ICD-10-CM

## 2020-04-30 DIAGNOSIS — M545 Low back pain, unspecified: Secondary | ICD-10-CM

## 2020-04-30 DIAGNOSIS — M6281 Muscle weakness (generalized): Secondary | ICD-10-CM

## 2020-04-30 DIAGNOSIS — R262 Difficulty in walking, not elsewhere classified: Secondary | ICD-10-CM

## 2020-04-30 NOTE — Therapy (Signed)
[place]. [place], [place], [address] Phone: [phone]   Fax:  [phone] Progress Note Reporting Period 03/30/20 to 04/30/20 for the first 10 visits  See note below for Objective Data and Assessment of Progress/Goals.       Physical Therapy Treatment  Patient Details  Name: [name] MRN: [id number] Date of Birth: 11-19-1932 Referring Provider (PT): Elsner   Encounter Date: 04/30/2020   PT End of Session - 04/30/20 2025    Visit Number 10    Date for PT Re-Evaluation 05/31/20    PT Start Time 1600    PT Stop Time 1700    PT Time Calculation (min) 60 min    Activity Tolerance Patient tolerated treatment well    Behavior During Therapy [facility] for tasks assessed/performed           Past Medical History:  Diagnosis Date  . Acalculous cholecystitis 08/24/2018  . Arthritis   . DVT of lower extremity (deep venous thrombosis) ([place])    right leg - after back surgery in 2019  . GERD (gastroesophageal reflux disease)   . History of blood transfusion   . Hypothyroidism   . PONV (postoperative nausea and vomiting)   . Ptosis of both eyelids   . Vertigo   . Wears glasses    reading    Past Surgical History:  Procedure Laterality Date  . ABDOMINAL HYSTERECTOMY  1986  . APPLICATION OF ROBOTIC ASSISTANCE FOR SPINAL PROCEDURE N/A 07/17/2017   Procedure: APPLICATION OF ROBOTIC ASSISTANCE FOR SPINAL PROCEDURE;  Surgeon: Kristeen Miss, MD;  Location: [place];  Service: Neurosurgery;  Laterality: N/A;  . APPLICATION OF ROBOTIC ASSISTANCE FOR SPINAL PROCEDURE N/A 09/01/2017   Procedure: APPLICATION OF ROBOTIC ASSISTANCE FOR SPINAL PROCEDURE;  Surgeon: Kristeen Miss, MD;  Location: [place];  Service: Neurosurgery;  Laterality: N/A;  . [place]  2000   right  . CARPAL TUNNEL RELEASE  2012   left  . CHOLECYSTECTOMY N/A 08/24/2018   Procedure: LAPAROSCOPIC  CHOLECYSTECTOMY WITH INTRAOPERATIVE CHOLANGIOGRAM;  Surgeon: Fanny Skates, MD;  Location: [place];  Service: General;  Laterality: N/A;  . COLONOSCOPY    . CYSTOCELE REPAIR  2007   sling  . DISTAL INTERPHALANGEAL JOINT FUSION Right 02/04/2014   Procedure: FUSION DISTAL INTERPHALANGEAL JOINT RIGHT INDEX FINGER ;  Surgeon: Daryll Brod, MD;  Location: [place];  Service: Orthopedics;  Laterality: Right;  . EYE SURGERY Bilateral    Cataract surgery with lens implant  . KNEE SURGERY  2009,2011   partial knee  . LAPAROSCOPY N/A 08/24/2018   Procedure: LAPAROSCOPY DIAGNOSTIC;  Surgeon: Fanny Skates, MD;  Location: [place];  Service: General;  Laterality: N/A;  . OPEN REDUCTION INTERNAL FIXATION (ORIF) PROXIMAL PHALANX Left 08/30/2013   Procedure: OPEN REDUCTION INTERNAL FIXATION (ORIF) PROXIMAL PHALANX FRACTURE LEFT SMALL FINGER; SPLINT RING FINGER;  Surgeon: Wynonia Sours, MD;  Location: [place];  Service: Orthopedics;  Laterality: Left;  . POSTERIOR LUMBAR FUSION 4 LEVEL N/A 07/17/2017   Procedure: Thoracic Ten - Lumbar Five revison of hardware with Mazor;  Surgeon: Kristeen Miss, MD;  Location: [place];  Service: Neurosurgery;  Laterality: N/A;  Thoracic/Lumbar  . PTOSIS REPAIR Bilateral 07/27/2015   Procedure: PTOSIS REPAIR;  Surgeon: Cristine Polio, MD;  Location: [place];  Service: Plastics;  Laterality: Bilateral;  . SHOULDER SURGERY     rt  rcr,and lt  . TONSILLECTOMY    . TOTAL KNEE ARTHROPLASTY Right 12/23/2019   Procedure: TOTAL KNEE ARTHROPLASTY;  Surgeon: Vickey Huger, MD;  Location: [facility];  Service: Orthopedics;  Laterality: Right;    There were no vitals filed for this visit.   Subjective Assessment - 04/30/20 1610    Subjective Continue to have pain in the back, she has appointment in two weeks for an injection    Pain Score 6     Pain Location Back                             [facility] Adult PT Treatment/Exercise  - 04/30/20 0001      Ambulation/Gait   Gait Comments walking with two SPC's, some cues for posture, still has some issues with balance with distraction, she is able to correct      High Level Balance   High Level Balance Comments on airex red tband scapular stabilization      Knee/Hip Exercises: Aerobic   Nustep level 5 x 6 minutes      Knee/Hip Exercises: Seated   Other Seated Knee/Hip Exercises on sit fit pelvic mobility, ball in lap isometric abs, pelvic stability, red tband, yellow tband rainght ankle motions all 2x15 each, some eccentric DF due to this being very weak      Acupuncturist Stimulation Location left mid/low back    Electrical Stimulation Action IFC    Electrical Stimulation Parameters sitting    Electrical Stimulation Goals Pain      Manual Therapy   Manual Therapy Soft tissue mobilization    Soft tissue mobilization to the mid and low back focus on paraspinals with her sitting and leaned forward mostly left side                    PT Short Term Goals - 04/16/20 1529      PT SHORT TERM GOAL #1   Title independent with initial HEP    Status Partially Met             PT Long Term Goals - 04/30/20 1659      PT LONG TERM GOAL #1   Title decrease pain 50%    Status On-going                 Plan - 04/30/20 1649    Clinical Impression Statement Patient continues with the back pain, reports feeling very good for about a day after our treatment, she walks so much better with the two canes, she still has issues with stumbles dragging feet with distraction.  She has the right foot drop that I can get her to neutral actively and she is exhibiting better control.    PT Next Visit Plan continue to work on strength for the back and the pain and balance    Consulted and Agree with Plan of Care Patient           Patient will benefit from skilled therapeutic intervention in order to improve the following deficits and  impairments:  Abnormal gait,Decreased range of motion,Decreased balance,Decreased scar mobility,Impaired flexibility,Decreased strength,Decreased mobility,Decreased endurance,Pain,Cardiopulmonary status limiting activity,Decreased activity tolerance,Postural dysfunction,Improper body mechanics,Increased muscle spasms  Visit Diagnosis: Acute bilateral low back pain without sciatica  Muscle spasm of back  Muscle weakness (generalized)  Difficulty in walking, not elsewhere classified     Problem List Patient Active Problem List   Diagnosis  Date Noted  . S/P total knee arthroplasty, right 12/23/2019  . Acalculous cholecystitis 08/24/2018  . Osteoarthritis of right knee   . Vertigo   . DDD (degenerative disc disease), cervical   . Benign essential HTN   . History of DVT (deep vein thrombosis)   . Tachycardia   . Steroid-induced hyperglycemia   . Hypothyroidism   . Neuropathic pain   . Acute blood loss anemia   . S/P lumbar fusion   . Closed L5 vertebral fracture ([place]) 08/31/2017  . Closed fracture of fifth lumbar vertebra ([place]) 08/30/2017  . Lumbar vertebral fracture ([place]) 07/17/2017  . Scoliosis 06/13/2017    Sumner Boast., PT 04/30/2020, 5:00 PM  [place]. [place], [place], [address] Phone: [phone]   Fax:  [phone]  Name: [name] MRN: [id number] Date of Birth: Jan 13, 1933

## 2020-05-05 ENCOUNTER — Encounter: Payer: Self-pay | Admitting: Physical Therapy

## 2020-05-05 ENCOUNTER — Other Ambulatory Visit: Payer: Self-pay

## 2020-05-05 ENCOUNTER — Ambulatory Visit: Payer: Medicare Other | Attending: Orthopedic Surgery | Admitting: Physical Therapy

## 2020-05-05 DIAGNOSIS — R262 Difficulty in walking, not elsewhere classified: Secondary | ICD-10-CM | POA: Insufficient documentation

## 2020-05-05 DIAGNOSIS — M6281 Muscle weakness (generalized): Secondary | ICD-10-CM | POA: Insufficient documentation

## 2020-05-05 DIAGNOSIS — M545 Low back pain, unspecified: Secondary | ICD-10-CM | POA: Diagnosis not present

## 2020-05-05 DIAGNOSIS — M62830 Muscle spasm of back: Secondary | ICD-10-CM | POA: Diagnosis present

## 2020-05-05 NOTE — Therapy (Signed)
[place]. [place], [place], [address] Phone: [phone]   Fax:  [phone]  Physical Therapy Treatment  Patient Details  Name: CANDIS KABEL MRN: [id number] Date of Birth: [birth year] Referring Provider (PT): Elsner   Encounter Date: 05/05/2020   PT End of Session - 05/05/20 1637    Visit Number 11    Date for PT Re-Evaluation 05/31/20    PT Start Time 5456    PT Stop Time 1646    PT Time Calculation (min) 49 min    Activity Tolerance Patient tolerated treatment well    Behavior During Therapy [facility] for tasks assessed/performed           Past Medical History:  Diagnosis Date  . Acalculous cholecystitis 08/24/2018  . Arthritis   . DVT of lower extremity (deep venous thrombosis) ([place])    right leg - after back surgery in 2019  . GERD (gastroesophageal reflux disease)   . History of blood transfusion   . Hypothyroidism   . PONV (postoperative nausea and vomiting)   . Ptosis of both eyelids   . Vertigo   . Wears glasses    reading    Past Surgical History:  Procedure Laterality Date  . ABDOMINAL HYSTERECTOMY  1986  . APPLICATION OF ROBOTIC ASSISTANCE FOR SPINAL PROCEDURE N/A 07/17/2017   Procedure: APPLICATION OF ROBOTIC ASSISTANCE FOR SPINAL PROCEDURE;  Surgeon: Kristeen Miss, MD;  Location: [place];  Service: Neurosurgery;  Laterality: N/A;  . APPLICATION OF ROBOTIC ASSISTANCE FOR SPINAL PROCEDURE N/A 09/01/2017   Procedure: APPLICATION OF ROBOTIC ASSISTANCE FOR SPINAL PROCEDURE;  Surgeon: Kristeen Miss, MD;  Location: [place];  Service: Neurosurgery;  Laterality: N/A;  . [place]  2000   right  . CARPAL TUNNEL RELEASE  2012   left  . CHOLECYSTECTOMY N/A 08/24/2018   Procedure: LAPAROSCOPIC CHOLECYSTECTOMY WITH INTRAOPERATIVE CHOLANGIOGRAM;  Surgeon: Fanny Skates, MD;  Location: [place];  Service: General;  Laterality: N/A;  . COLONOSCOPY    .  CYSTOCELE REPAIR  2007   sling  . DISTAL INTERPHALANGEAL JOINT FUSION Right 02/04/2014   Procedure: FUSION DISTAL INTERPHALANGEAL JOINT RIGHT INDEX FINGER ;  Surgeon: Daryll Brod, MD;  Location: [place];  Service: Orthopedics;  Laterality: Right;  . EYE SURGERY Bilateral    Cataract surgery with lens implant  . KNEE SURGERY  2009,2011   partial knee  . LAPAROSCOPY N/A 08/24/2018   Procedure: LAPAROSCOPY DIAGNOSTIC;  Surgeon: Fanny Skates, MD;  Location: [place];  Service: General;  Laterality: N/A;  . OPEN REDUCTION INTERNAL FIXATION (ORIF) PROXIMAL PHALANX Left 08/30/2013   Procedure: OPEN REDUCTION INTERNAL FIXATION (ORIF) PROXIMAL PHALANX FRACTURE LEFT SMALL FINGER; SPLINT RING FINGER;  Surgeon: Wynonia Sours, MD;  Location: [place];  Service: Orthopedics;  Laterality: Left;  . POSTERIOR LUMBAR FUSION 4 LEVEL N/A 07/17/2017   Procedure: Thoracic Ten - Lumbar Five revison of hardware with Mazor;  Surgeon: Kristeen Miss, MD;  Location: [place];  Service: Neurosurgery;  Laterality: N/A;  Thoracic/Lumbar  . PTOSIS REPAIR Bilateral 07/27/2015   Procedure: PTOSIS REPAIR;  Surgeon: Cristine Polio, MD;  Location: [place];  Service: Plastics;  Laterality: Bilateral;  . SHOULDER SURGERY     rt rcr,and lt  . TONSILLECTOMY    . TOTAL KNEE ARTHROPLASTY Right 12/23/2019   Procedure: TOTAL KNEE ARTHROPLASTY;  Surgeon: Vickey Huger, MD;  [facility];  Service: Orthopedics;  Laterality: Right;    There were no vitals filed for this visit.   Subjective Assessment - 05/05/20 1603    Subjective My back just hurts, feels really good after I leave here    Currently in Pain? Yes    Pain Score 6     Pain Location Back    Pain Orientation Left    Aggravating Factors  just always hurts                             [facility] Adult PT Treatment/Exercise - 05/05/20 0001      Ambulation/Gait   Gait Comments walking with two SPC's, some cues  for posture, still has some issues with balance with distraction, she is able to correct, fast walking with HHA      High Level Balance   High Level Balance Activities Side stepping;Backward walking;Direction changes    High Level Balance Comments on airex standing, reaching, marching in place, on airex toe touches 4" and 6" steps.      Knee/Hip Exercises: Aerobic   Nustep level 5 x 8 minutes      Knee/Hip Exercises: Seated   Other Seated Knee/Hip Exercises on sit fit pelvic mobility, ball in lap isometric abs, pelvic stability, red tband, yellow tband rainght ankle motions all 2x15 each, some eccentric DF due to this being very weak      Manual Therapy   Manual Therapy Soft tissue mobilization    Soft tissue mobilization to the mid and low back focus on paraspinals with her sitting and leaned forward mostly left side                    PT Short Term Goals - 04/16/20 1529      PT SHORT TERM GOAL #1   Title independent with initial HEP    Status Partially Met             PT Long Term Goals - 04/30/20 1659      PT LONG TERM GOAL #1   Title decrease pain 50%    Status On-going                 Plan - 05/05/20 1651    Clinical Impression Statement I focused a little more on balance today, she struggles with the dynamic surface, she really has difficulty and does not feel safe, she tends to grab out at things to hold onto due to fear and loss of balance.  The right foot and ankle are very weak and I think that this is where the hesitation comes from    PT Next Visit Plan continue to work on strength for the back and the pain and balance    Consulted and Agree with Plan of Care Patient           Patient will benefit from skilled therapeutic intervention in order to improve the following deficits and impairments:  Abnormal gait,Decreased range of motion,Decreased balance,Decreased scar mobility,Impaired flexibility,Decreased strength,Decreased mobility,Decreased  endurance,Pain,Cardiopulmonary status limiting activity,Decreased activity tolerance,Postural dysfunction,Improper body mechanics,Increased muscle spasms  Visit Diagnosis: Acute bilateral low back pain without sciatica  Muscle spasm of back  Muscle weakness (generalized)  Difficulty in walking, not elsewhere classified     Problem List Patient Active Problem List   Diagnosis Date Noted  . S/P total knee arthroplasty, right 12/23/2019  . Acalculous cholecystitis 08/24/2018  . Osteoarthritis of right  knee   . Vertigo   . DDD (degenerative disc disease), cervical   . Benign essential HTN   . History of DVT (deep vein thrombosis)   . Tachycardia   . Steroid-induced hyperglycemia   . Hypothyroidism   . Neuropathic pain   . Acute blood loss anemia   . S/P lumbar fusion   . Closed L5 vertebral fracture ([place]) 08/31/2017  . Closed fracture of fifth lumbar vertebra ([place]) 08/30/2017  . Lumbar vertebral fracture ([place]) 07/17/2017  . Scoliosis 06/13/2017    Sumner Boast., PT 05/05/2020, 4:53 PM  [place]. [place], [place], [address] Phone: [phone]   Fax:  [phone]  Name: JONIECE SMOTHERMAN MRN: [id number] Date of Birth: [birth year]

## 2020-05-07 ENCOUNTER — Other Ambulatory Visit: Payer: Self-pay

## 2020-05-07 ENCOUNTER — Encounter: Payer: Self-pay | Admitting: Physical Therapy

## 2020-05-07 ENCOUNTER — Ambulatory Visit: Payer: Medicare Other | Admitting: Physical Therapy

## 2020-05-07 DIAGNOSIS — M62830 Muscle spasm of back: Secondary | ICD-10-CM

## 2020-05-07 DIAGNOSIS — R262 Difficulty in walking, not elsewhere classified: Secondary | ICD-10-CM

## 2020-05-07 DIAGNOSIS — M6281 Muscle weakness (generalized): Secondary | ICD-10-CM

## 2020-05-07 DIAGNOSIS — M545 Low back pain, unspecified: Secondary | ICD-10-CM

## 2020-05-07 NOTE — Therapy (Signed)
[address]. [place], [place], [address] Phone: [phone]   Fax:  [phone]  Physical Therapy Treatment  Patient Details  Name: [name] MRN: [id number] Date of Birth: 1933-03-06 Referring Provider (PT): Elsner   Encounter Date: 05/07/2020   PT End of Session - 05/07/20 1652    Visit Number 12    Date for PT Re-Evaluation 05/31/20    PT Start Time 1600    PT Stop Time 1700    PT Time Calculation (min) 60 min    Activity Tolerance Patient tolerated treatment well    Behavior During Therapy [facility] for tasks assessed/performed           Past Medical History:  Diagnosis Date  . Acalculous cholecystitis 08/24/2018  . Arthritis   . DVT of lower extremity (deep venous thrombosis) ([place])    right leg - after back surgery in 2019  . GERD (gastroesophageal reflux disease)   . History of blood transfusion   . Hypothyroidism   . PONV (postoperative nausea and vomiting)   . Ptosis of both eyelids   . Vertigo   . Wears glasses    reading    Past Surgical History:  Procedure Laterality Date  . ABDOMINAL HYSTERECTOMY  1986  . APPLICATION OF ROBOTIC ASSISTANCE FOR SPINAL PROCEDURE N/A 07/17/2017   Procedure: APPLICATION OF ROBOTIC ASSISTANCE FOR SPINAL PROCEDURE;  Surgeon: Kristeen Miss, MD;  Location: [place];  Service: Neurosurgery;  Laterality: N/A;  . APPLICATION OF ROBOTIC ASSISTANCE FOR SPINAL PROCEDURE N/A 09/01/2017   Procedure: APPLICATION OF ROBOTIC ASSISTANCE FOR SPINAL PROCEDURE;  Surgeon: Kristeen Miss, MD;  Location: [place];  Service: Neurosurgery;  Laterality: N/A;  . [place]  2000   right  . CARPAL TUNNEL RELEASE  2012   left  . CHOLECYSTECTOMY N/A 08/24/2018   Procedure: LAPAROSCOPIC CHOLECYSTECTOMY WITH INTRAOPERATIVE CHOLANGIOGRAM;  Surgeon: Fanny Skates, MD;  Location: [place];  Service: General;  Laterality: N/A;  . COLONOSCOPY    .  CYSTOCELE REPAIR  2007   sling  . DISTAL INTERPHALANGEAL JOINT FUSION Right 02/04/2014   Procedure: FUSION DISTAL INTERPHALANGEAL JOINT RIGHT INDEX FINGER ;  Surgeon: Daryll Brod, MD;  Location: [place];  Service: Orthopedics;  Laterality: Right;  . EYE SURGERY Bilateral    Cataract surgery with lens implant  . KNEE SURGERY  2009,2011   partial knee  . LAPAROSCOPY N/A 08/24/2018   Procedure: LAPAROSCOPY DIAGNOSTIC;  Surgeon: Fanny Skates, MD;  Location: [place];  Service: General;  Laterality: N/A;  . OPEN REDUCTION INTERNAL FIXATION (ORIF) PROXIMAL PHALANX Left 08/30/2013   Procedure: OPEN REDUCTION INTERNAL FIXATION (ORIF) PROXIMAL PHALANX FRACTURE LEFT SMALL FINGER; SPLINT RING FINGER;  Surgeon: Wynonia Sours, MD;  Location: [place];  Service: Orthopedics;  Laterality: Left;  . POSTERIOR LUMBAR FUSION 4 LEVEL N/A 07/17/2017   Procedure: Thoracic Ten - Lumbar Five revison of hardware with Mazor;  Surgeon: Kristeen Miss, MD;  Location: [place];  Service: Neurosurgery;  Laterality: N/A;  Thoracic/Lumbar  . PTOSIS REPAIR Bilateral 07/27/2015   Procedure: PTOSIS REPAIR;  Surgeon: Cristine Polio, MD;  Location: [place];  Service: Plastics;  Laterality: Bilateral;  . SHOULDER SURGERY     rt rcr,and lt  . TONSILLECTOMY    . TOTAL KNEE ARTHROPLASTY Right 12/23/2019   Procedure: TOTAL KNEE ARTHROPLASTY;  Surgeon: Vickey Huger, MD;  [facility];  Service: Orthopedics;  Laterality: Right;    There were no vitals filed for this visit.   Subjective Assessment - 05/07/20 1611    Subjective "about the same"    Currently in Pain? Yes    Pain Score 6     Pain Location Back    Pain Orientation Left    Pain Descriptors / Indicators Spasm    Pain Relieving Factors I get relief for a good bit but it always comes back                             [facility] Adult PT Treatment/Exercise - 05/07/20 0001      Ambulation/Gait   Gait  Comments working with two SPC's, I intentionally tried to distract her some with head turns and questions, she really does have issues with LOB with this had her do about 200 feet at a time      High Level Balance   High Level Balance Comments on airex standing, reaching, marching in place, on airex toe touches 4" and 6" steps. one foot on airex one on solid surface balance      Electrical Stimulation   Electrical Stimulation Location left mid/low back    Electrical Stimulation Action IFC    Electrical Stimulation Parameters sitting    Electrical Stimulation Goals Pain      Manual Therapy   Manual Therapy Soft tissue mobilization    Soft tissue mobilization to the mid and low back focus on paraspinals with her sitting and leaned forward mostly left side                    PT Short Term Goals - 04/16/20 1529      PT SHORT TERM GOAL #1   Title independent with initial HEP    Status Partially Met             PT Long Term Goals - 05/07/20 1656      PT LONG TERM GOAL #1   Title decrease pain 50%    Status On-going      PT LONG TERM GOAL #2   Title increase lumbar ROM 25% with less pain    Status On-going      PT LONG TERM GOAL #3   Title increase LE strength to 4+/5    Status On-going      PT LONG TERM GOAL #4   Title go up and down stairs step over step    Status Partially Met                 Plan - 05/07/20 1655    Clinical Impression Statement Again focusing on balance trying to help her get to more independence and be safe.  She continues to have significant pain in the left low back, better with treatment but does not carry over.  She has issues with her balance, she did better today as I had her walk farther with the [facility], and she was able to maintain her posture better and have less increase of pain.    PT Next Visit Plan continue to work on strength for the back and the pain and balance    Consulted and Agree with Plan of Care Patient            Patient will benefit from skilled therapeutic intervention in order to improve the following deficits and impairments:  Abnormal gait,Decreased range of motion,Decreased  balance,Decreased scar mobility,Impaired flexibility,Decreased strength,Decreased mobility,Decreased endurance,Pain,Cardiopulmonary status limiting activity,Decreased activity tolerance,Postural dysfunction,Improper body mechanics,Increased muscle spasms  Visit Diagnosis: Acute bilateral low back pain without sciatica  Muscle spasm of back  Muscle weakness (generalized)  Difficulty in walking, not elsewhere classified     Problem List Patient Active Problem List   Diagnosis Date Noted  . S/P total knee arthroplasty, right 12/23/2019  . Acalculous cholecystitis 08/24/2018  . Osteoarthritis of right knee   . Vertigo   . DDD (degenerative disc disease), cervical   . Benign essential HTN   . History of DVT (deep vein thrombosis)   . Tachycardia   . Steroid-induced hyperglycemia   . Hypothyroidism   . Neuropathic pain   . Acute blood loss anemia   . S/P lumbar fusion   . Closed L5 vertebral fracture ([place]) 08/31/2017  . Closed fracture of fifth lumbar vertebra ([place]) 08/30/2017  . Lumbar vertebral fracture ([place]) 07/17/2017  . Scoliosis 06/13/2017    Sumner Boast., PT 05/07/2020, 4:58 PM  [place]. [place], [place], [address] Phone: [phone]   Fax:  [phone]  Name: [name] MRN: [id number] Date of Birth: [birth year]

## 2020-05-12 ENCOUNTER — Encounter: Payer: Medicare Other | Admitting: Physical Therapy

## 2020-05-12 ENCOUNTER — Other Ambulatory Visit: Payer: Self-pay

## 2020-05-12 ENCOUNTER — Ambulatory Visit: Payer: Medicare Other | Admitting: Physical Therapy

## 2020-05-12 ENCOUNTER — Encounter: Payer: Self-pay | Admitting: Physical Therapy

## 2020-05-12 DIAGNOSIS — M62830 Muscle spasm of back: Secondary | ICD-10-CM

## 2020-05-12 DIAGNOSIS — R262 Difficulty in walking, not elsewhere classified: Secondary | ICD-10-CM

## 2020-05-12 DIAGNOSIS — M545 Low back pain, unspecified: Secondary | ICD-10-CM | POA: Diagnosis not present

## 2020-05-12 DIAGNOSIS — M6281 Muscle weakness (generalized): Secondary | ICD-10-CM

## 2020-05-12 NOTE — Therapy (Signed)
[place]. [place], [place], [address] Phone: [phone]   Fax:  [phone]  Physical Therapy Treatment  Patient Details  Name: [name] MRN: [id number] Date of Birth: 1933/01/25 Referring Provider (PT): Elsner   Encounter Date: 05/12/2020   PT End of Session - 05/12/20 1517    Visit Number 13    Date for PT Re-Evaluation 05/31/20    PT Start Time 1438    PT Stop Time 1522    PT Time Calculation (min) 44 min    Activity Tolerance Patient tolerated treatment well    Behavior During Therapy [facility] for tasks assessed/performed           Past Medical History:  Diagnosis Date  . Acalculous cholecystitis 08/24/2018  . Arthritis   . DVT of lower extremity (deep venous thrombosis) ([place])    right leg - after back surgery in 2019  . GERD (gastroesophageal reflux disease)   . History of blood transfusion   . Hypothyroidism   . PONV (postoperative nausea and vomiting)   . Ptosis of both eyelids   . Vertigo   . Wears glasses    reading    Past Surgical History:  Procedure Laterality Date  . ABDOMINAL HYSTERECTOMY  1986  . APPLICATION OF ROBOTIC ASSISTANCE FOR SPINAL PROCEDURE N/A 07/17/2017   Procedure: APPLICATION OF ROBOTIC ASSISTANCE FOR SPINAL PROCEDURE;  Surgeon: Kristeen Miss, MD;  Location: [place];  Service: Neurosurgery;  Laterality: N/A;  . APPLICATION OF ROBOTIC ASSISTANCE FOR SPINAL PROCEDURE N/A 09/01/2017   Procedure: APPLICATION OF ROBOTIC ASSISTANCE FOR SPINAL PROCEDURE;  Surgeon: Kristeen Miss, MD;  Location: [place];  Service: Neurosurgery;  Laterality: N/A;  . [place]  2000   right  . CARPAL TUNNEL RELEASE  2012   left  . CHOLECYSTECTOMY N/A 08/24/2018   Procedure: LAPAROSCOPIC CHOLECYSTECTOMY WITH INTRAOPERATIVE CHOLANGIOGRAM;  Surgeon: Fanny Skates, MD;  Location: [place];  Service: General;  Laterality: N/A;  . COLONOSCOPY    .  CYSTOCELE REPAIR  2007   sling  . DISTAL INTERPHALANGEAL JOINT FUSION Right 02/04/2014   Procedure: FUSION DISTAL INTERPHALANGEAL JOINT RIGHT INDEX FINGER ;  Surgeon: Daryll Brod, MD;  Location: [place];  Service: Orthopedics;  Laterality: Right;  . EYE SURGERY Bilateral    Cataract surgery with lens implant  . KNEE SURGERY  2009,2011   partial knee  . LAPAROSCOPY N/A 08/24/2018   Procedure: LAPAROSCOPY DIAGNOSTIC;  Surgeon: Fanny Skates, MD;  Location: [place];  Service: General;  Laterality: N/A;  . OPEN REDUCTION INTERNAL FIXATION (ORIF) PROXIMAL PHALANX Left 08/30/2013   Procedure: OPEN REDUCTION INTERNAL FIXATION (ORIF) PROXIMAL PHALANX FRACTURE LEFT SMALL FINGER; SPLINT RING FINGER;  Surgeon: Wynonia Sours, MD;  Location: [place];  Service: Orthopedics;  Laterality: Left;  . POSTERIOR LUMBAR FUSION 4 LEVEL N/A 07/17/2017   Procedure: Thoracic Ten - Lumbar Five revison of hardware with Mazor;  Surgeon: Kristeen Miss, MD;  Location: [place];  Service: Neurosurgery;  Laterality: N/A;  Thoracic/Lumbar  . PTOSIS REPAIR Bilateral 07/27/2015   Procedure: PTOSIS REPAIR;  Surgeon: Cristine Polio, MD;  Location: [place];  Service: Plastics;  Laterality: Bilateral;  . SHOULDER SURGERY     rt rcr,and lt  . TONSILLECTOMY    . TOTAL KNEE ARTHROPLASTY Right 12/23/2019   Procedure: TOTAL KNEE ARTHROPLASTY;  Surgeon: Vickey Huger, MD;  [facility];  Service: Orthopedics;  Laterality: Right;    There were no vitals filed for this visit.   Subjective Assessment - 05/12/20 1516    Subjective Patient reports that she has really been hurting since Sunday, unsure of a specific cause, reports that she took celebrex to help with the pain, reports no relief    Currently in Pain? Yes    Pain Score 7     Pain Location Back    Pain Orientation Left;Lower;Mid    Pain Descriptors / Indicators Spasm;Aching;Tightness    Aggravating Factors  unsure                              [facility] Adult PT Treatment/Exercise - 05/12/20 0001      Electrical Stimulation   Electrical Stimulation Location left mid/low back    Electrical Stimulation Action IFC    Electrical Stimulation Parameters sitting    Electrical Stimulation Goals Pain      Manual Therapy   Manual Therapy Soft tissue mobilization    Soft tissue mobilization to the mid and low back focus on paraspinals with her sitting and leaned forward mostly left side    Passive ROM gentle LE stretches with her in sitting                    PT Short Term Goals - 04/16/20 1529      PT SHORT TERM GOAL #1   Title independent with initial HEP    Status Partially Met             PT Long Term Goals - 05/07/20 1656      PT LONG TERM GOAL #1   Title decrease pain 50%    Status On-going      PT LONG TERM GOAL #2   Title increase lumbar ROM 25% with less pain    Status On-going      PT LONG TERM GOAL #3   Title increase LE strength to 4+/5    Status On-going      PT LONG TERM GOAL #4   Title go up and down stairs step over step    Status Partially Met                 Plan - 05/12/20 1530    Clinical Impression Statement Patient was in more pain today, she is unsure of a cause, seems to be tender and have spasms in the left lumbar area, she reports "feels like something is wrong".  I focused today's treatment on STM and stretching to see if we could get this to relax and her have less pain, when she left she was telling me that she had really minimal pain, but it usually comes back, she will be seeing the MD in the next week    PT Next Visit Plan see if she is feeling better, may do very gentle light lumbar motions    Consulted and Agree with Plan of Care Patient           Patient will benefit from skilled therapeutic intervention in order to improve the following deficits and impairments:  Abnormal gait,Decreased range of motion,Decreased  balance,Decreased scar mobility,Impaired flexibility,Decreased strength,Decreased mobility,Decreased endurance,Pain,Cardiopulmonary status limiting activity,Decreased activity tolerance,Postural dysfunction,Improper body mechanics,Increased muscle spasms  Visit Diagnosis: Acute bilateral low back pain without sciatica  Muscle spasm of back  Muscle weakness (generalized)  Difficulty in walking, not elsewhere classified  Problem List Patient Active Problem List   Diagnosis Date Noted  . S/P total knee arthroplasty, right 12/23/2019  . Acalculous cholecystitis 08/24/2018  . Osteoarthritis of right knee   . Vertigo   . DDD (degenerative disc disease), cervical   . Benign essential HTN   . History of DVT (deep vein thrombosis)   . Tachycardia   . Steroid-induced hyperglycemia   . Hypothyroidism   . Neuropathic pain   . Acute blood loss anemia   . S/P lumbar fusion   . Closed L5 vertebral fracture ([place]) 08/31/2017  . Closed fracture of fifth lumbar vertebra ([place]) 08/30/2017  . Lumbar vertebral fracture ([place]) 07/17/2017  . Scoliosis 06/13/2017    Sumner Boast., PT 05/12/2020, 3:33 PM  [place]. [place], [place], [address] Phone: [phone]   Fax:  [phone]  Name: [name] MRN: [id number] Date of Birth: 02-May-1932

## 2020-05-14 ENCOUNTER — Other Ambulatory Visit: Payer: Self-pay

## 2020-05-14 ENCOUNTER — Encounter: Payer: Self-pay | Admitting: Physical Therapy

## 2020-05-14 ENCOUNTER — Ambulatory Visit: Payer: Medicare Other | Admitting: Physical Therapy

## 2020-05-14 DIAGNOSIS — M62830 Muscle spasm of back: Secondary | ICD-10-CM

## 2020-05-14 DIAGNOSIS — M6281 Muscle weakness (generalized): Secondary | ICD-10-CM

## 2020-05-14 DIAGNOSIS — M545 Low back pain, unspecified: Secondary | ICD-10-CM

## 2020-05-14 DIAGNOSIS — R262 Difficulty in walking, not elsewhere classified: Secondary | ICD-10-CM

## 2020-05-14 NOTE — Therapy (Signed)
[address]. [place], [place], [address] Phone: [phone]   Fax:  [phone]  Physical Therapy Treatment  Patient Details  Name: [name] MRN: [id number] Date of Birth: 1933/03/18 Referring Provider (PT): Elsner   Encounter Date: 05/14/2020   PT End of Session - 05/14/20 1648    Visit Number 14    Date for PT Re-Evaluation 05/31/20    PT Start Time 1603    PT Stop Time 1700    PT Time Calculation (min) 57 min    Activity Tolerance Patient tolerated treatment well    Behavior During Therapy [facility] for tasks assessed/performed           Past Medical History:  Diagnosis Date  . Acalculous cholecystitis 08/24/2018  . Arthritis   . DVT of lower extremity (deep venous thrombosis) ([place])    right leg - after back surgery in 2019  . GERD (gastroesophageal reflux disease)   . History of blood transfusion   . Hypothyroidism   . PONV (postoperative nausea and vomiting)   . Ptosis of both eyelids   . Vertigo   . Wears glasses    reading    Past Surgical History:  Procedure Laterality Date  . ABDOMINAL HYSTERECTOMY  1986  . APPLICATION OF ROBOTIC ASSISTANCE FOR SPINAL PROCEDURE N/A 07/17/2017   Procedure: APPLICATION OF ROBOTIC ASSISTANCE FOR SPINAL PROCEDURE;  Surgeon: Kristeen Miss, MD;  Location: [place];  Service: Neurosurgery;  Laterality: N/A;  . APPLICATION OF ROBOTIC ASSISTANCE FOR SPINAL PROCEDURE N/A 09/01/2017   Procedure: APPLICATION OF ROBOTIC ASSISTANCE FOR SPINAL PROCEDURE;  Surgeon: Kristeen Miss, MD;  Location: [place];  Service: Neurosurgery;  Laterality: N/A;  . [place]  2000   right  . CARPAL TUNNEL RELEASE  2012   left  . CHOLECYSTECTOMY N/A 08/24/2018   Procedure: LAPAROSCOPIC CHOLECYSTECTOMY WITH INTRAOPERATIVE CHOLANGIOGRAM;  Surgeon: Fanny Skates, MD;  Location: [place];  Service: General;  Laterality: N/A;  . COLONOSCOPY    .  CYSTOCELE REPAIR  2007   sling  . DISTAL INTERPHALANGEAL JOINT FUSION Right 02/04/2014   Procedure: FUSION DISTAL INTERPHALANGEAL JOINT RIGHT INDEX FINGER ;  Surgeon: Daryll Brod, MD;  Location: [place];  Service: Orthopedics;  Laterality: Right;  . EYE SURGERY Bilateral    Cataract surgery with lens implant  . KNEE SURGERY  2009,2011   partial knee  . LAPAROSCOPY N/A 08/24/2018   Procedure: LAPAROSCOPY DIAGNOSTIC;  Surgeon: Fanny Skates, MD;  Location: [place];  Service: General;  Laterality: N/A;  . OPEN REDUCTION INTERNAL FIXATION (ORIF) PROXIMAL PHALANX Left 08/30/2013   Procedure: OPEN REDUCTION INTERNAL FIXATION (ORIF) PROXIMAL PHALANX FRACTURE LEFT SMALL FINGER; SPLINT RING FINGER;  Surgeon: Wynonia Sours, MD;  Location: [place];  Service: Orthopedics;  Laterality: Left;  . POSTERIOR LUMBAR FUSION 4 LEVEL N/A 07/17/2017   Procedure: Thoracic Ten - Lumbar Five revison of hardware with Mazor;  Surgeon: Kristeen Miss, MD;  Location: [place];  Service: Neurosurgery;  Laterality: N/A;  Thoracic/Lumbar  . PTOSIS REPAIR Bilateral 07/27/2015   Procedure: PTOSIS REPAIR;  Surgeon: Cristine Polio, MD;  Location: [place];  Service: Plastics;  Laterality: Bilateral;  . SHOULDER SURGERY     rt rcr,and lt  . TONSILLECTOMY    . TOTAL KNEE ARTHROPLASTY Right 12/23/2019   Procedure: TOTAL KNEE ARTHROPLASTY;  Surgeon: Vickey Huger, MD;  [facility];  Service: Orthopedics;  Laterality: Right;    There were no vitals filed for this visit.   Subjective Assessment - 05/14/20 1610    Subjective Patient reports that she is feeling better today, less overall pain but still hurting    Currently in Pain? Yes    Pain Score 6     Pain Location Back    Pain Orientation Left;Mid;Lower    Pain Descriptors / Indicators Sore;Aching    Aggravating Factors  difficulty sleeping    Pain Relieving Factors takes celebrex              [facility] PT Assessment -  05/14/20 0001      Timed Up and Go Test   Normal TUG (seconds) 16                         [facility] Adult PT Treatment/Exercise - 05/14/20 0001      Ambulation/Gait   Gait Comments using two SPC's working on posture did about 140 feet without much increase of LBP, if we go further she tends to wobble, if we use one cane she will wobble with the side trunk lean at about 100 feet      Knee/Hip Exercises: Aerobic   Nustep level 5 x 8 minutes      Knee/Hip Exercises: Seated   Other Seated Knee/Hip Exercises on sit fit pelvic mobility, ball in lap isometric abs, pelvic stability, red tband, yellow tband rainght ankle motions all 2x15 each, tried some side to side stretching for the ribs and back, gentle rotation and then gentle thoracic extension      Electrical Stimulation   Electrical Stimulation Location left mid/low back    Electrical Stimulation Action IFC    Electrical Stimulation Parameters sitting    Electrical Stimulation Goals Pain      Manual Therapy   Manual Therapy Soft tissue mobilization    Soft tissue mobilization to the mid and low back focus on paraspinals with her sitting and leaned forward mostly left side    Passive ROM gentle LE stretches with her in sitting                    PT Short Term Goals - 04/16/20 1529      PT SHORT TERM GOAL #1   Title independent with initial HEP    Status Partially Met             PT Long Term Goals - 05/14/20 1652      PT LONG TERM GOAL #1   Title decrease pain 50%    Status On-going      PT LONG TERM GOAL #2   Title increase lumbar ROM 25% with less pain    Status On-going      PT LONG TERM GOAL #3   Title increase LE strength to 4+/5    Status Partially Met      PT LONG TERM GOAL #4   Title go up and down stairs step over step    Status Achieved      PT LONG TERM GOAL #5   Title decrease TUG to 16 seconds    Status Partially Met                 Plan - 05/14/20 1649     Clinical Impression Statement Patient having a little less pain today, she has struggled with left low back to mid back pain, she  is very tight in the mms here and has a few very tender spots.  Our treatment makes her feel good but the pain always comes back.  I added a little bit more mobility to the low back with gentle active and passive stretches, she thought the thoracic extension felt good.  We are still working on gait with walker and using two canes, she does wel with the two canes and is able to go about 200 feet without much difficulty, with one cane she will have a significant wobble and start having increased pain at about 100 feet.  We have also worked some on balance.    PT Next Visit Plan I will continue with my current plan unless the MD feels we should change things up    Consulted and Agree with Plan of Care Patient           Patient will benefit from skilled therapeutic intervention in order to improve the following deficits and impairments:  Abnormal gait,Decreased range of motion,Decreased balance,Decreased scar mobility,Impaired flexibility,Decreased strength,Decreased mobility,Decreased endurance,Pain,Cardiopulmonary status limiting activity,Decreased activity tolerance,Postural dysfunction,Improper body mechanics,Increased muscle spasms  Visit Diagnosis: Acute bilateral low back pain without sciatica  Muscle spasm of back  Muscle weakness (generalized)  Difficulty in walking, not elsewhere classified     Problem List Patient Active Problem List   Diagnosis Date Noted  . S/P total knee arthroplasty, right 12/23/2019  . Acalculous cholecystitis 08/24/2018  . Osteoarthritis of right knee   . Vertigo   . DDD (degenerative disc disease), cervical   . Benign essential HTN   . History of DVT (deep vein thrombosis)   . Tachycardia   . Steroid-induced hyperglycemia   . Hypothyroidism   . Neuropathic pain   . Acute blood loss anemia   . S/P lumbar fusion   . Closed  L5 vertebral fracture ([address]) 08/31/2017  . Closed fracture of fifth lumbar vertebra ([place]) 08/30/2017  . Lumbar vertebral fracture ([place]) 07/17/2017  . Scoliosis 06/13/2017    Sumner Boast., PT 05/14/2020, 4:53 PM  [place]. [place], [place], [address] Phone: [phone]   Fax:  [phone]  Name: [name] MRN: [id number] Date of Birth: 26-Aug-1932

## 2020-05-18 ENCOUNTER — Ambulatory Visit (INDEPENDENT_AMBULATORY_CARE_PROVIDER_SITE_OTHER): Payer: Medicare Other | Admitting: Otolaryngology

## 2020-05-19 ENCOUNTER — Ambulatory Visit: Payer: Medicare Other | Admitting: Physical Therapy

## 2020-05-19 ENCOUNTER — Other Ambulatory Visit: Payer: Self-pay

## 2020-05-19 ENCOUNTER — Encounter: Payer: Self-pay | Admitting: Physical Therapy

## 2020-05-19 DIAGNOSIS — M6281 Muscle weakness (generalized): Secondary | ICD-10-CM

## 2020-05-19 DIAGNOSIS — M62830 Muscle spasm of back: Secondary | ICD-10-CM

## 2020-05-19 DIAGNOSIS — M545 Low back pain, unspecified: Secondary | ICD-10-CM | POA: Diagnosis not present

## 2020-05-19 DIAGNOSIS — R262 Difficulty in walking, not elsewhere classified: Secondary | ICD-10-CM

## 2020-05-19 NOTE — Therapy (Signed)
[place]. [place], [place], [address] Phone: [phone]   Fax:  [phone]  Physical Therapy Treatment  Patient Details  Name: [name] MRN: [id number] Date of Birth: [birth year] Referring Provider (PT): Elsner   Encounter Date: 05/19/2020   PT End of Session - 05/19/20 1646    Visit Number 15    Date for PT Re-Evaluation 05/31/20    PT Start Time 1605    PT Stop Time 1652    PT Time Calculation (min) 47 min    Activity Tolerance Patient tolerated treatment well    Behavior During Therapy [facility] for tasks assessed/performed           Past Medical History:  Diagnosis Date  . Acalculous cholecystitis 08/24/2018  . Arthritis   . DVT of lower extremity (deep venous thrombosis) ([place])    right leg - after back surgery in 2019  . GERD (gastroesophageal reflux disease)   . History of blood transfusion   . Hypothyroidism   . PONV (postoperative nausea and vomiting)   . Ptosis of both eyelids   . Vertigo   . Wears glasses    reading    Past Surgical History:  Procedure Laterality Date  . ABDOMINAL HYSTERECTOMY  1986  . APPLICATION OF ROBOTIC ASSISTANCE FOR SPINAL PROCEDURE N/A 07/17/2017   Procedure: APPLICATION OF ROBOTIC ASSISTANCE FOR SPINAL PROCEDURE;  Surgeon: Kristeen Miss, MD;  Location: [place];  Service: Neurosurgery;  Laterality: N/A;  . APPLICATION OF ROBOTIC ASSISTANCE FOR SPINAL PROCEDURE N/A 09/01/2017   Procedure: APPLICATION OF ROBOTIC ASSISTANCE FOR SPINAL PROCEDURE;  Surgeon: Kristeen Miss, MD;  Location: [place];  Service: Neurosurgery;  Laterality: N/A;  . [place]  2000   right  . CARPAL TUNNEL RELEASE  2012   left  . CHOLECYSTECTOMY N/A 08/24/2018   Procedure: LAPAROSCOPIC CHOLECYSTECTOMY WITH INTRAOPERATIVE CHOLANGIOGRAM;  Surgeon: Fanny Skates, MD;  Location: [place];  Service: General;  Laterality: N/A;  . COLONOSCOPY    .  CYSTOCELE REPAIR  2007   sling  . DISTAL INTERPHALANGEAL JOINT FUSION Right 02/04/2014   Procedure: FUSION DISTAL INTERPHALANGEAL JOINT RIGHT INDEX FINGER ;  Surgeon: Daryll Brod, MD;  Location: [place];  Service: Orthopedics;  Laterality: Right;  . EYE SURGERY Bilateral    Cataract surgery with lens implant  . KNEE SURGERY  2009,2011   partial knee  . LAPAROSCOPY N/A 08/24/2018   Procedure: LAPAROSCOPY DIAGNOSTIC;  Surgeon: Fanny Skates, MD;  Location: [place];  Service: General;  Laterality: N/A;  . OPEN REDUCTION INTERNAL FIXATION (ORIF) PROXIMAL PHALANX Left 08/30/2013   Procedure: OPEN REDUCTION INTERNAL FIXATION (ORIF) PROXIMAL PHALANX FRACTURE LEFT SMALL FINGER; SPLINT RING FINGER;  Surgeon: Wynonia Sours, MD;  Location: [place];  Service: Orthopedics;  Laterality: Left;  . POSTERIOR LUMBAR FUSION 4 LEVEL N/A 07/17/2017   Procedure: Thoracic Ten - Lumbar Five revison of hardware with Mazor;  Surgeon: Kristeen Miss, MD;  Location: [place];  Service: Neurosurgery;  Laterality: N/A;  Thoracic/Lumbar  . PTOSIS REPAIR Bilateral 07/27/2015   Procedure: PTOSIS REPAIR;  Surgeon: Cristine Polio, MD;  Location: [place];  Service: Plastics;  Laterality: Bilateral;  . SHOULDER SURGERY     rt rcr,and lt  . TONSILLECTOMY    . TOTAL KNEE ARTHROPLASTY Right 12/23/2019   Procedure: TOTAL KNEE ARTHROPLASTY;  Surgeon: Vickey Huger, MD;  [facility];  Service: Orthopedics;  Laterality: Right;    There were no vitals filed for this visit.   Subjective Assessment - 05/19/20 1642    Subjective Patient reports that she saw the MD yesterday, she told me tha tshe has sacroiliitis.  She reports that she will be seeing MD tomorrow for injections.    Currently in Pain? Yes    Pain Score 7     Pain Location Sacrum    Pain Orientation Left;Right    Pain Descriptors / Indicators Aching;Sore                             [facility] Adult PT  Treatment/Exercise - 05/19/20 0001      Electrical Stimulation   Electrical Stimulation Location bilateral SI area    Electrical Stimulation Action IFC    Electrical Stimulation Parameters sitting    Electrical Stimulation Goals Pain      Manual Therapy   Manual Therapy Soft tissue mobilization    Soft tissue mobilization to the SI areas very tener    Passive ROM PROM of LE's with some gentle distraction to provide some traction to the SI                    PT Short Term Goals - 04/16/20 1529      PT SHORT TERM GOAL #1   Title independent with initial HEP    Status Partially Met             PT Long Term Goals - 05/19/20 1649      PT LONG TERM GOAL #1   Title decrease pain 50%    Status On-going                 Plan - 05/19/20 1647    Clinical Impression Statement Patient saw the MD yesterday and she was diagnosed with sacroiliitis, she reports that she is having more and more difficulty sleeping, she will have injection tomorrow, she is very tender in the left SI, there are significant knots that are tender in the left SI area.  She reported that the distraction felt good, piriformis stretch was very painful    PT Next Visit Plan will cancel the next visit due to the injections and see her next week.    Consulted and Agree with Plan of Care Patient           Patient will benefit from skilled therapeutic intervention in order to improve the following deficits and impairments:  Abnormal gait,Decreased range of motion,Decreased balance,Decreased scar mobility,Impaired flexibility,Decreased strength,Decreased mobility,Decreased endurance,Pain,Cardiopulmonary status limiting activity,Decreased activity tolerance,Postural dysfunction,Improper body mechanics,Increased muscle spasms  Visit Diagnosis: Acute bilateral low back pain without sciatica  Muscle spasm of back  Muscle weakness (generalized)  Difficulty in walking, not elsewhere  classified     Problem List Patient Active Problem List   Diagnosis Date Noted  . S/P total knee arthroplasty, right 12/23/2019  . Acalculous cholecystitis 08/24/2018  . Osteoarthritis of right knee   . Vertigo   . DDD (degenerative disc disease), cervical   . Benign essential HTN   . History of DVT (deep vein thrombosis)   . Tachycardia   . Steroid-induced hyperglycemia   . Hypothyroidism   . Neuropathic pain   . Acute blood loss anemia   . S/P lumbar fusion   . Closed L5 vertebral fracture ([place]) 08/31/2017  . Closed fracture of fifth lumbar vertebra ([place])  08/30/2017  . Lumbar vertebral fracture ([place]) 07/17/2017  . Scoliosis 06/13/2017    Sumner Boast., PT 05/19/2020, 4:49 PM  [place]. [place], [place], [address] Phone: [phone]   Fax:  [phone]  Name: [name] MRN: [id number] Date of Birth: Oct 05, 1932

## 2020-05-21 ENCOUNTER — Ambulatory Visit: Payer: Medicare Other | Admitting: Physical Therapy

## 2020-05-29 ENCOUNTER — Other Ambulatory Visit: Payer: Self-pay

## 2020-05-29 ENCOUNTER — Encounter: Payer: Self-pay | Admitting: Physical Therapy

## 2020-05-29 ENCOUNTER — Ambulatory Visit: Payer: Medicare Other | Admitting: Physical Therapy

## 2020-05-29 DIAGNOSIS — M6281 Muscle weakness (generalized): Secondary | ICD-10-CM

## 2020-05-29 DIAGNOSIS — M62830 Muscle spasm of back: Secondary | ICD-10-CM

## 2020-05-29 DIAGNOSIS — M545 Low back pain, unspecified: Secondary | ICD-10-CM | POA: Diagnosis not present

## 2020-05-29 DIAGNOSIS — R262 Difficulty in walking, not elsewhere classified: Secondary | ICD-10-CM

## 2020-05-29 NOTE — Therapy (Signed)
[place]. [place], [place], [address] Phone: [phone]   Fax:  [phone]  Physical Therapy Treatment  Patient Details  Name: [name] MRN: [id number] Date of Birth: May 06, 1932 Referring Provider (PT): Elsner   Encounter Date: 05/29/2020   PT End of Session - 05/29/20 0954    Visit Number 16    Date for PT Re-Evaluation 05/31/20    PT Start Time 0922    PT Stop Time 1010    PT Time Calculation (min) 48 min    Activity Tolerance Patient tolerated treatment well    Behavior During Therapy [facility] for tasks assessed/performed           Past Medical History:  Diagnosis Date  . Acalculous cholecystitis 08/24/2018  . Arthritis   . DVT of lower extremity (deep venous thrombosis) ([place])    right leg - after back surgery in 2019  . GERD (gastroesophageal reflux disease)   . History of blood transfusion   . Hypothyroidism   . PONV (postoperative nausea and vomiting)   . Ptosis of both eyelids   . Vertigo   . Wears glasses    reading    Past Surgical History:  Procedure Laterality Date  . ABDOMINAL HYSTERECTOMY  1986  . APPLICATION OF ROBOTIC ASSISTANCE FOR SPINAL PROCEDURE N/A 07/17/2017   Procedure: APPLICATION OF ROBOTIC ASSISTANCE FOR SPINAL PROCEDURE;  Surgeon: Kristeen Miss, MD;  Location: [place];  Service: Neurosurgery;  Laterality: N/A;  . APPLICATION OF ROBOTIC ASSISTANCE FOR SPINAL PROCEDURE N/A 09/01/2017   Procedure: APPLICATION OF ROBOTIC ASSISTANCE FOR SPINAL PROCEDURE;  Surgeon: Kristeen Miss, MD;  Location: [place];  Service: Neurosurgery;  Laterality: N/A;  . [place]  2000   right  . CARPAL TUNNEL RELEASE  2012   left  . CHOLECYSTECTOMY N/A 08/24/2018   Procedure: LAPAROSCOPIC CHOLECYSTECTOMY WITH INTRAOPERATIVE CHOLANGIOGRAM;  Surgeon: Fanny Skates, MD;  Location: [place];  Service: General;  Laterality: N/A;  . COLONOSCOPY    .  CYSTOCELE REPAIR  2007   sling  . DISTAL INTERPHALANGEAL JOINT FUSION Right 02/04/2014   Procedure: FUSION DISTAL INTERPHALANGEAL JOINT RIGHT INDEX FINGER ;  Surgeon: Daryll Brod, MD;  Location: [place];  Service: Orthopedics;  Laterality: Right;  . EYE SURGERY Bilateral    Cataract surgery with lens implant  . KNEE SURGERY  2009,2011   partial knee  . LAPAROSCOPY N/A 08/24/2018   Procedure: LAPAROSCOPY DIAGNOSTIC;  Surgeon: Fanny Skates, MD;  Location: [place];  Service: General;  Laterality: N/A;  . OPEN REDUCTION INTERNAL FIXATION (ORIF) PROXIMAL PHALANX Left 08/30/2013   Procedure: OPEN REDUCTION INTERNAL FIXATION (ORIF) PROXIMAL PHALANX FRACTURE LEFT SMALL FINGER; SPLINT RING FINGER;  Surgeon: Wynonia Sours, MD;  Location: [place];  Service: Orthopedics;  Laterality: Left;  . POSTERIOR LUMBAR FUSION 4 LEVEL N/A 07/17/2017   Procedure: Thoracic Ten - Lumbar Five revison of hardware with Mazor;  Surgeon: Kristeen Miss, MD;  Location: [place];  Service: Neurosurgery;  Laterality: N/A;  Thoracic/Lumbar  . PTOSIS REPAIR Bilateral 07/27/2015   Procedure: PTOSIS REPAIR;  Surgeon: Cristine Polio, MD;  Location: [place];  Service: Plastics;  Laterality: Bilateral;  . SHOULDER SURGERY     rt rcr,and lt  . TONSILLECTOMY    . TOTAL KNEE ARTHROPLASTY Right 12/23/2019   Procedure: TOTAL KNEE ARTHROPLASTY;  Surgeon: Vickey Huger, MD;  [facility];  Service: Orthopedics;  Laterality: Right;    There were no vitals filed for this visit.   Subjective Assessment - 05/29/20 0951    Subjective Patient had injection in the LB last week, we cancelled two of out PT appointments to assure good relief, she reports that the pain is much improved, but some pain a little higher, I assume mm spasms and tightness    Currently in Pain? Yes    Pain Score 4     Pain Location Back    Pain Orientation Mid;Lower    Pain Descriptors / Indicators Spasm;Tightness     Aggravating Factors  the injections really helped                             [facility] Adult PT Treatment/Exercise - 05/29/20 0001      Ambulation/Gait   Gait Comments two SPC's with SBA 150' cues for posture and step length, did not c/o pain, just fatigue      Knee/Hip Exercises: Aerobic   Nustep level 4 x 7 minutes      Knee/Hip Exercises: Seated   Long Arc Quad Right;3 sets;10 reps    Long Arc Quad Weight 2 lbs.    Ball Squeeze 20    Other Seated Knee/Hip Exercises on sit fit pelvic mobility, ball in lap isometric abs, pelvic stability, red tband, yellow tband rainght ankle motions all 2x15 each, tried some side to side stretching for the ribs and back, gentle rotation and then gentle thoracic extension    Marching Right;2 sets;10 reps    Marching Limitations 2                    PT Short Term Goals - 04/16/20 1529      PT SHORT TERM GOAL #1   Title independent with initial HEP    Status Partially Met             PT Long Term Goals - 05/29/20 0956      PT LONG TERM GOAL #1   Title decrease pain 50%    Status Partially Met                 Plan - 05/29/20 0954    Clinical Impression Statement Patient had injections in the SI and low back area last week, we held off on PT per the protocol, she does report relief, does report doing a lot of running around the last two days with errands, she does have pain in the parapsinals more in the T/L area today, she is very tight today, did walk 148fet with more fatigue than pain today    PT Next Visit Plan write renewal, will want to work on gait tolerance    Consulted and Agree with Plan of Care Patient           Patient will benefit from skilled therapeutic intervention in order to improve the following deficits and impairments:  Abnormal gait,Decreased range of motion,Decreased balance,Decreased scar mobility,Impaired flexibility,Decreased strength,Decreased mobility,Decreased  endurance,Pain,Cardiopulmonary status limiting activity,Decreased activity tolerance,Postural dysfunction,Improper body mechanics,Increased muscle spasms  Visit Diagnosis: Acute bilateral low back pain without sciatica  Muscle spasm of back  Muscle weakness (generalized)  Difficulty in walking, not elsewhere classified     Problem List Patient Active Problem List   Diagnosis Date Noted  . S/P total knee arthroplasty, right 12/23/2019  . Acalculous cholecystitis 08/24/2018  . Osteoarthritis of right knee   .  Vertigo   . DDD (degenerative disc disease), cervical   . Benign essential HTN   . History of DVT (deep vein thrombosis)   . Tachycardia   . Steroid-induced hyperglycemia   . Hypothyroidism   . Neuropathic pain   . Acute blood loss anemia   . S/P lumbar fusion   . Closed L5 vertebral fracture ([place]) 08/31/2017  . Closed fracture of fifth lumbar vertebra ([place]) 08/30/2017  . Lumbar vertebral fracture ([place]) 07/17/2017  . Scoliosis 06/13/2017    Sumner Boast., PT 05/29/2020, 9:56 AM  [place]. [place], [place], [address] Phone: [phone]   Fax:  [phone]  Name: [name] MRN: [id number] Date of Birth: 08-22-1932

## 2020-06-01 ENCOUNTER — Other Ambulatory Visit: Payer: Self-pay

## 2020-06-01 ENCOUNTER — Ambulatory Visit (INDEPENDENT_AMBULATORY_CARE_PROVIDER_SITE_OTHER): Payer: Medicare Other | Admitting: Otolaryngology

## 2020-06-01 ENCOUNTER — Encounter (INDEPENDENT_AMBULATORY_CARE_PROVIDER_SITE_OTHER): Payer: Self-pay | Admitting: Otolaryngology

## 2020-06-01 VITALS — Temp 96.8°F

## 2020-06-01 DIAGNOSIS — K141 Geographic tongue: Secondary | ICD-10-CM

## 2020-06-01 DIAGNOSIS — K219 Gastro-esophageal reflux disease without esophagitis: Secondary | ICD-10-CM

## 2020-06-01 NOTE — Progress Notes (Signed)
HPI: [name] is a 87 y.o. female who returns today for evaluation of soreness on the right side of her tongue that has been persistent since previous visit in November of last year.  She also has reflux problems for which she takes an acid therapy she believes omeprazole every morning.  The soreness on the tongue has been persistent.  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  Suggested using Biotene at that time..  Past Medical History:  Diagnosis Date  . Acalculous cholecystitis 08/24/2018  . Arthritis   . DVT of lower extremity (deep venous thrombosis) ([place])    right leg - after back surgery in 2019  . GERD (gastroesophageal reflux disease)   . History of blood transfusion   . Hypothyroidism   . PONV (postoperative nausea and vomiting)   . Ptosis of both eyelids   . Vertigo   . Wears glasses    reading   Past Surgical History:  Procedure Laterality Date  . ABDOMINAL HYSTERECTOMY  1986  . APPLICATION OF ROBOTIC ASSISTANCE FOR SPINAL PROCEDURE N/A 07/17/2017   Procedure: APPLICATION OF ROBOTIC ASSISTANCE FOR SPINAL PROCEDURE;  Surgeon: Kristeen Miss, MD;  Location: [place];  Service: Neurosurgery;  Laterality: N/A;  . APPLICATION OF ROBOTIC ASSISTANCE FOR SPINAL PROCEDURE N/A 09/01/2017   Procedure: APPLICATION OF ROBOTIC ASSISTANCE FOR SPINAL PROCEDURE;  Surgeon: Kristeen Miss, MD;  Location: [place];  Service: Neurosurgery;  Laterality: N/A;  . [place]  2000   right  . CARPAL TUNNEL RELEASE  2012   left  . CHOLECYSTECTOMY N/A 08/24/2018   Procedure: LAPAROSCOPIC CHOLECYSTECTOMY WITH INTRAOPERATIVE CHOLANGIOGRAM;  Surgeon: Fanny Skates, MD;  Location: [place];  Service: General;  Laterality: N/A;  . COLONOSCOPY    . CYSTOCELE REPAIR  2007   sling  . DISTAL INTERPHALANGEAL JOINT FUSION Right 02/04/2014   Procedure: FUSION DISTAL INTERPHALANGEAL JOINT RIGHT INDEX FINGER ;  Surgeon: Daryll Brod, MD;   Location: [place];  Service: Orthopedics;  Laterality: Right;  . EYE SURGERY Bilateral    Cataract surgery with lens implant  . KNEE SURGERY  2009,2011   partial knee  . LAPAROSCOPY N/A 08/24/2018   Procedure: LAPAROSCOPY DIAGNOSTIC;  Surgeon: Fanny Skates, MD;  Location: [place];  Service: General;  Laterality: N/A;  . OPEN REDUCTION INTERNAL FIXATION (ORIF) PROXIMAL PHALANX Left 08/30/2013   Procedure: OPEN REDUCTION INTERNAL FIXATION (ORIF) PROXIMAL PHALANX FRACTURE LEFT SMALL FINGER; SPLINT RING FINGER;  Surgeon: Wynonia Sours, MD;  Location: [place];  Service: Orthopedics;  Laterality: Left;  . POSTERIOR LUMBAR FUSION 4 LEVEL N/A 07/17/2017   Procedure: Thoracic Ten - Lumbar Five revison of hardware with Mazor;  Surgeon: Kristeen Miss, MD;  Location: [place];  Service: Neurosurgery;  Laterality: N/A;  Thoracic/Lumbar  . PTOSIS REPAIR Bilateral 07/27/2015   Procedure: PTOSIS REPAIR;  Surgeon: Cristine Polio, MD;  Location: [place];  Service: Plastics;  Laterality: Bilateral;  . SHOULDER SURGERY     rt rcr,and lt  . TONSILLECTOMY    . TOTAL KNEE ARTHROPLASTY Right 12/23/2019   Procedure: TOTAL KNEE ARTHROPLASTY;  Surgeon: Vickey Huger, MD;  Location[facility];  Service: Orthopedics;  Laterality: Right;   Social History   Socioeconomic History  . Marital status: Married    Spouse name: Not on file  . Number of children: Not on file  . Years of education: Not  on file  . Highest education level: Not on file  Occupational History  . Not on file  Tobacco Use  . Smoking status: Former Research scientist (life sciences)  . Smokeless tobacco: Never Used  . Tobacco comment: 07/14/17-  quit for 62  Vaping Use  . Vaping Use: Never used  Substance and Sexual Activity  . Alcohol use: No  . Drug use: No  . Sexual activity: Not on file  Other Topics Concern  . Not on file  Social History Narrative  . Not on file   Social Determinants of Health   Financial Resource  Strain: Not on file  Food Insecurity: Not on file  Transportation Needs: Not on file  Physical Activity: Not on file  Stress: Not on file  Social Connections: Not on file   Family History  Problem Relation Age of Onset  . Breast cancer Neg Hx    Allergies  Allergen Reactions  . Keflex [Cephalexin] Hives   Prior to Admission medications   Medication Sig Start Date End Date Taking? Authorizing Provider  aspirin EC 325 MG tablet Take 1 tablet (325 mg total) by mouth 2 (two) times daily. 12/23/19 12/22/20  Donia Ast, PA  diphenhydramine-acetaminophen (TYLENOL PM) 25-500 MG TABS tablet Take 2 tablets by mouth at bedtime.     [provider]  levothyroxine (SYNTHROID, LEVOTHROID) 50 MCG tablet Take 50 mcg by mouth daily before breakfast.     [provider]  Multiple Vitamin (MULTIVITAMIN WITH MINERALS) TABS tablet Take 1 tablet by mouth at bedtime.    [provider]  OVER THE COUNTER MEDICATION Take 2 capsules by mouth at bedtime. Memory and Nerve Capsules    [provider]  oxyCODONE (OXY IR/ROXICODONE) 5 MG immediate release tablet Take 1 tablet (5 mg total) by mouth every 4 (four) hours as needed for severe pain. 12/23/19   Donia Ast, PA  tiZANidine (ZANAFLEX) 2 MG tablet Take 1 tablet (2 mg total) by mouth every 6 (six) hours as needed for muscle spasms. 12/23/19   Donia Ast, PA  Vitamins-Lipotropics (B-50 PO) Take 1 tablet by mouth at bedtime.    [provider]     Positive ROS: Otherwise negative  All other systems have been reviewed and were otherwise negative with the exception of those mentioned in the HPI and as above.  Physical Exam: Constitutional: Alert, well-appearing, no acute distress Ears: External ears without lesions or tenderness. Ear canals are clear bilaterally with intact, clear TMs.  Nasal: External nose without lesions. Septum with minimal deformity.. Clear nasal passages  bilaterally. Oral: On examination of the tongue she has a smooth area on the right lateral tongue and changes consistent with geographic tongue.  This is all soft to palpation.  There is no exudate.  The papilla are missing in the area that is irritated.  It is more on the right dorsal lateral tongue.  The left calf of the tongue appears normal. Neck: No palpable adenopathy or masses Respiratory: Breathing comfortably  Skin: No facial/neck lesions or rash noted.  Procedures  Assessment: Geographic tongue Clinical symptoms of chronic reflux disease.  Plan: Suggested taking omeprazole 40 mg daily before dinner instead of first thing in the morning as she does not really notice heartburn during the day but is worse in the mornings when she wakes up. She will continue with oral hygiene. She will follow-up in 2 months for recheck.  If this does not improve consider possible biopsy on follow-up.  Radene Journey, MD

## 2020-06-02 ENCOUNTER — Encounter: Payer: Self-pay | Admitting: Physical Therapy

## 2020-06-02 ENCOUNTER — Ambulatory Visit: Payer: Medicare Other | Attending: Orthopedic Surgery | Admitting: Physical Therapy

## 2020-06-02 ENCOUNTER — Other Ambulatory Visit: Payer: Self-pay

## 2020-06-02 DIAGNOSIS — M62830 Muscle spasm of back: Secondary | ICD-10-CM | POA: Diagnosis present

## 2020-06-02 DIAGNOSIS — M25561 Pain in right knee: Secondary | ICD-10-CM | POA: Diagnosis present

## 2020-06-02 DIAGNOSIS — M6281 Muscle weakness (generalized): Secondary | ICD-10-CM | POA: Diagnosis present

## 2020-06-02 DIAGNOSIS — M545 Low back pain, unspecified: Secondary | ICD-10-CM | POA: Insufficient documentation

## 2020-06-02 DIAGNOSIS — R262 Difficulty in walking, not elsewhere classified: Secondary | ICD-10-CM | POA: Diagnosis present

## 2020-06-02 NOTE — Therapy (Signed)
[place]. [place], [place], [address] Phone: [phone]   Fax:  [phone]  Physical Therapy Treatment  Patient Details  Name: [name] MRN: [id number] Date of Birth: [birth year] Referring Provider (PT): Elsner   Encounter Date: 06/02/2020   PT End of Session - 06/02/20 1645    Visit Number 17    Date for PT Re-Evaluation 07/03/20    PT Start Time 1600    PT Stop Time 1645    PT Time Calculation (min) 45 min    Activity Tolerance Patient tolerated treatment well    Behavior During Therapy [facility] for tasks assessed/performed           Past Medical History:  Diagnosis Date  . Acalculous cholecystitis 08/24/2018  . Arthritis   . DVT of lower extremity (deep venous thrombosis) ([place])    right leg - after back surgery in 2019  . GERD (gastroesophageal reflux disease)   . History of blood transfusion   . Hypothyroidism   . PONV (postoperative nausea and vomiting)   . Ptosis of both eyelids   . Vertigo   . Wears glasses    reading    Past Surgical History:  Procedure Laterality Date  . ABDOMINAL HYSTERECTOMY  1986  . APPLICATION OF ROBOTIC ASSISTANCE FOR SPINAL PROCEDURE N/A 07/17/2017   Procedure: APPLICATION OF ROBOTIC ASSISTANCE FOR SPINAL PROCEDURE;  Surgeon: Kristeen Miss, MD;  Location: [place];  Service: Neurosurgery;  Laterality: N/A;  . APPLICATION OF ROBOTIC ASSISTANCE FOR SPINAL PROCEDURE N/A 09/01/2017   Procedure: APPLICATION OF ROBOTIC ASSISTANCE FOR SPINAL PROCEDURE;  Surgeon: Kristeen Miss, MD;  Location: [place];  Service: Neurosurgery;  Laterality: N/A;  . [place]  2000   right  . CARPAL TUNNEL RELEASE  2012   left  . CHOLECYSTECTOMY N/A 08/24/2018   Procedure: LAPAROSCOPIC CHOLECYSTECTOMY WITH INTRAOPERATIVE CHOLANGIOGRAM;  Surgeon: Fanny Skates, MD;  Location: [place];  Service: General;  Laterality: N/A;  . COLONOSCOPY    .  CYSTOCELE REPAIR  2007   sling  . DISTAL INTERPHALANGEAL JOINT FUSION Right 02/04/2014   Procedure: FUSION DISTAL INTERPHALANGEAL JOINT RIGHT INDEX FINGER ;  Surgeon: Daryll Brod, MD;  Location: [place];  Service: Orthopedics;  Laterality: Right;  . EYE SURGERY Bilateral    Cataract surgery with lens implant  . KNEE SURGERY  2009,2011   partial knee  . LAPAROSCOPY N/A 08/24/2018   Procedure: LAPAROSCOPY DIAGNOSTIC;  Surgeon: Fanny Skates, MD;  Location: [place];  Service: General;  Laterality: N/A;  . OPEN REDUCTION INTERNAL FIXATION (ORIF) PROXIMAL PHALANX Left 08/30/2013   Procedure: OPEN REDUCTION INTERNAL FIXATION (ORIF) PROXIMAL PHALANX FRACTURE LEFT SMALL FINGER; SPLINT RING FINGER;  Surgeon: Wynonia Sours, MD;  Location: [place];  Service: Orthopedics;  Laterality: Left;  . POSTERIOR LUMBAR FUSION 4 LEVEL N/A 07/17/2017   Procedure: Thoracic Ten - Lumbar Five revison of hardware with Mazor;  Surgeon: Kristeen Miss, MD;  Location: [place];  Service: Neurosurgery;  Laterality: N/A;  Thoracic/Lumbar  . PTOSIS REPAIR Bilateral 07/27/2015   Procedure: PTOSIS REPAIR;  Surgeon: Cristine Polio, MD;  Location: [place];  Service: Plastics;  Laterality: Bilateral;  . SHOULDER SURGERY     rt rcr,and lt  . TONSILLECTOMY    . TOTAL KNEE ARTHROPLASTY Right 12/23/2019   Procedure: TOTAL KNEE ARTHROPLASTY;  Surgeon: Vickey Huger, MD;  [facility];  Service: Orthopedics;  Laterality: Right;    There were no vitals filed for this visit.   Subjective Assessment - 06/02/20 1601    Subjective I am a little better but still have pain,    Currently in Pain? Yes    Pain Score 3     Pain Location Back    Pain Orientation Lower    Aggravating Factors  pain is worse in the afternoon                             [facility] Adult PT Treatment/Exercise - 06/02/20 0001      Ambulation/Gait   Gait Comments gait with two SPC's, 120 feet,  started having some increase of LBP to a 5/10, stairs 4" and 6" step over step, walked with her out to the car using the FWW, we then changed to the two SPC's, had her step up on the curb and then walk through the grass and then in and out of gravel and back to the car, CGA      High Level Balance   High Level Balance Comments airex balance beam tandem walk and side stepping, on airex head turns and reaching      Knee/Hip Exercises: Aerobic   Nustep level 5 x 7 minutes                    PT Short Term Goals - 04/16/20 1529      PT SHORT TERM GOAL #1   Title independent with initial HEP    Status Partially Met             PT Long Term Goals - 06/02/20 1653      PT LONG TERM GOAL #1   Title decrease pain 50%    Status Partially Met      PT LONG TERM GOAL #2   Title increase lumbar ROM 25% with less pain    Status Partially Met      PT LONG TERM GOAL #3   Title increase LE strength to 4+/5    Status Partially Met      PT LONG TERM GOAL #4   Title go up and down stairs step over step    Status Achieved      PT LONG TERM GOAL #5   Title decrease TUG to 16 seconds    Status Partially Met                 Plan - 06/02/20 1647    Clinical Impression Statement Patient doing better, still having back pain, we are focusing our treatment on balance and gait.  She uses a FWW, I am working with her using two canes and with good posutre, she is no longer doing the big waddle, she does have LBP after about 120 feet.  She really has fear and uncertainty on the airex, I had her walk in the grass and gravel today with CGA.  With the higher level activities she does need CGA as she does lose her balance, I did see ankle, hip and step strategies today during balance activity    PT Next Visit Plan we will work on gait tolerance with the LBP and work on balance    Consulted and Agree with Plan of Care Patient           Patient will benefit from skilled therapeutic  intervention in order to improve the  following deficits and impairments:  Abnormal gait,Decreased range of motion,Decreased balance,Decreased scar mobility,Impaired flexibility,Decreased strength,Decreased mobility,Decreased endurance,Pain,Cardiopulmonary status limiting activity,Decreased activity tolerance,Postural dysfunction,Improper body mechanics,Increased muscle spasms  Visit Diagnosis: Acute bilateral low back pain without sciatica - Plan: PT plan of care cert/re-cert  Muscle spasm of back - Plan: PT plan of care cert/re-cert  Muscle weakness (generalized) - Plan: PT plan of care cert/re-cert  Difficulty in walking, not elsewhere classified - Plan: PT plan of care cert/re-cert     Problem List Patient Active Problem List   Diagnosis Date Noted  . S/P total knee arthroplasty, right 12/23/2019  . Acalculous cholecystitis 08/24/2018  . Osteoarthritis of right knee   . Vertigo   . DDD (degenerative disc disease), cervical   . Benign essential HTN   . History of DVT (deep vein thrombosis)   . Tachycardia   . Steroid-induced hyperglycemia   . Hypothyroidism   . Neuropathic pain   . Acute blood loss anemia   . S/P lumbar fusion   . Closed L5 vertebral fracture ([place]) 08/31/2017  . Closed fracture of fifth lumbar vertebra ([place]) 08/30/2017  . Lumbar vertebral fracture ([place]) 07/17/2017  . Scoliosis 06/13/2017    Sumner Boast., PT 06/02/2020, 4:56 PM  [place]. [place], [place], [address] Phone: [phone]   Fax:  [phone]  Name: [name] MRN: [id number] Date of Birth: 24-Jun-1932

## 2020-06-04 ENCOUNTER — Other Ambulatory Visit: Payer: Self-pay

## 2020-06-04 ENCOUNTER — Encounter: Payer: Self-pay | Admitting: Physical Therapy

## 2020-06-04 ENCOUNTER — Ambulatory Visit: Payer: Medicare Other | Admitting: Physical Therapy

## 2020-06-04 DIAGNOSIS — M545 Low back pain, unspecified: Secondary | ICD-10-CM

## 2020-06-04 DIAGNOSIS — R262 Difficulty in walking, not elsewhere classified: Secondary | ICD-10-CM

## 2020-06-04 DIAGNOSIS — M62830 Muscle spasm of back: Secondary | ICD-10-CM

## 2020-06-04 DIAGNOSIS — M6281 Muscle weakness (generalized): Secondary | ICD-10-CM

## 2020-06-04 NOTE — Therapy (Signed)
[place]. [place], [place], [address] Phone: [phone]   Fax:  [phone]  Physical Therapy Treatment  Patient Details  Name: ELISSA GRIESHOP MRN: [id number] Date of Birth: 1932-12-11 Referring Provider (PT): Elsner   Encounter Date: 06/04/2020   PT End of Session - 06/04/20 1653    Visit Number 18    Date for PT Re-Evaluation 07/03/20    PT Start Time 1600    PT Stop Time 1646    PT Time Calculation (min) 46 min    Activity Tolerance Patient tolerated treatment well    Behavior During Therapy [facility] for tasks assessed/performed           Past Medical History:  Diagnosis Date  . Acalculous cholecystitis 08/24/2018  . Arthritis   . DVT of lower extremity (deep venous thrombosis) ([place])    right leg - after back surgery in 2019  . GERD (gastroesophageal reflux disease)   . History of blood transfusion   . Hypothyroidism   . PONV (postoperative nausea and vomiting)   . Ptosis of both eyelids   . Vertigo   . Wears glasses    reading    Past Surgical History:  Procedure Laterality Date  . ABDOMINAL HYSTERECTOMY  1986  . APPLICATION OF ROBOTIC ASSISTANCE FOR SPINAL PROCEDURE N/A 07/17/2017   Procedure: APPLICATION OF ROBOTIC ASSISTANCE FOR SPINAL PROCEDURE;  Surgeon: Kristeen Miss, MD;  Location: [place];  Service: Neurosurgery;  Laterality: N/A;  . APPLICATION OF ROBOTIC ASSISTANCE FOR SPINAL PROCEDURE N/A 09/01/2017   Procedure: APPLICATION OF ROBOTIC ASSISTANCE FOR SPINAL PROCEDURE;  Surgeon: Kristeen Miss, MD;  Location: [place];  Service: Neurosurgery;  Laterality: N/A;  . [place]  2000   right  . CARPAL TUNNEL RELEASE  2012   left  . CHOLECYSTECTOMY N/A 08/24/2018   Procedure: LAPAROSCOPIC CHOLECYSTECTOMY WITH INTRAOPERATIVE CHOLANGIOGRAM;  Surgeon: Fanny Skates, MD;  Location: [place];  Service: General;  Laterality: N/A;  . COLONOSCOPY    .  CYSTOCELE REPAIR  2007   sling  . DISTAL INTERPHALANGEAL JOINT FUSION Right 02/04/2014   Procedure: FUSION DISTAL INTERPHALANGEAL JOINT RIGHT INDEX FINGER ;  Surgeon: Daryll Brod, MD;  Location: [place];  Service: Orthopedics;  Laterality: Right;  . EYE SURGERY Bilateral    Cataract surgery with lens implant  . KNEE SURGERY  2009,2011   partial knee  . LAPAROSCOPY N/A 08/24/2018   Procedure: LAPAROSCOPY DIAGNOSTIC;  Surgeon: Fanny Skates, MD;  Location: [place];  Service: General;  Laterality: N/A;  . OPEN REDUCTION INTERNAL FIXATION (ORIF) PROXIMAL PHALANX Left 08/30/2013   Procedure: OPEN REDUCTION INTERNAL FIXATION (ORIF) PROXIMAL PHALANX FRACTURE LEFT SMALL FINGER; SPLINT RING FINGER;  Surgeon: Wynonia Sours, MD;  Location: [place];  Service: Orthopedics;  Laterality: Left;  . POSTERIOR LUMBAR FUSION 4 LEVEL N/A 07/17/2017   Procedure: Thoracic Ten - Lumbar Five revison of hardware with Mazor;  Surgeon: Kristeen Miss, MD;  Location: [place];  Service: Neurosurgery;  Laterality: N/A;  Thoracic/Lumbar  . PTOSIS REPAIR Bilateral 07/27/2015   Procedure: PTOSIS REPAIR;  Surgeon: Cristine Polio, MD;  Location: [place];  Service: Plastics;  Laterality: Bilateral;  . SHOULDER SURGERY     rt rcr,and lt  . TONSILLECTOMY    . TOTAL KNEE ARTHROPLASTY Right 12/23/2019   Procedure: TOTAL KNEE ARTHROPLASTY;  Surgeon: Vickey Huger, MD;  [facility];  Service: Orthopedics;  Laterality: Right;    There were no vitals filed for this visit.   Subjective Assessment - 06/04/20 1602    Subjective Doing okay, feel like I need to walk better    Currently in Pain? Yes    Pain Score 3     Pain Location Back                             [facility] Adult PT Treatment/Exercise - 06/04/20 0001      Ambulation/Gait   Gait Comments stairs step over step with hand rails, gait without device with very close SBA 3x 25 feet.  Gait outside in the  grass on very uneven terrain with two SPC's and CGA x 160 feet.      High Level Balance   High Level Balance Comments airex balance beam tandem walk and side stepping, on airex head turns and reaching, solid airex ball toss      Knee/Hip Exercises: Aerobic   Nustep level 6 x 7 minutes      Knee/Hip Exercises: Machines for Strengthening   Cybex Knee Extension 5# 2x10 with the right leg only    Cybex Knee Flexion 15# 2x10 with the right leg only      Knee/Hip Exercises: Standing   Hip Abduction Both;2 sets;10 reps    Abduction Limitations 3#    Hip Extension Both;2 sets;10 reps    Extension Limitations 3#    Other Standing Knee Exercises heel raises                    PT Short Term Goals - 04/16/20 1529      PT SHORT TERM GOAL #1   Title independent with initial HEP    Status Partially Met             PT Long Term Goals - 06/04/20 1656      PT LONG TERM GOAL #1   Title decrease pain 50%    Status Partially Met      PT LONG TERM GOAL #2   Title increase lumbar ROM 25% with less pain    Status Partially Met                 Plan - 06/04/20 1654    Clinical Impression Statement I continued to focus my treatment on gait and balance, I added back some LE strength today, I feel that we need to be careful as she has had back and knee flare ups in the past.  She did well with the activities today and did not have any extra back or knee pain, I went back in the yard with her today and had her negotiate curb with CGA for the uneven terrain walking and the curb    PT Next Visit Plan we will work on gait tolerance with the LBP and work on Teacher, music and Agree with Plan of Care Patient           Patient will benefit from skilled therapeutic intervention in order to improve the following deficits and impairments:  Abnormal gait,Decreased range of motion,Decreased balance,Decreased scar mobility,Impaired flexibility,Decreased strength,Decreased  mobility,Decreased endurance,Pain,Cardiopulmonary status limiting activity,Decreased activity tolerance,Postural dysfunction,Improper body mechanics,Increased muscle spasms  Visit Diagnosis: Acute bilateral low back pain without sciatica  Muscle spasm of back  Muscle weakness (generalized)  Difficulty in walking, not elsewhere classified     Problem List  Patient Active Problem List   Diagnosis Date Noted  . S/P total knee arthroplasty, right 12/23/2019  . Acalculous cholecystitis 08/24/2018  . Osteoarthritis of right knee   . Vertigo   . DDD (degenerative disc disease), cervical   . Benign essential HTN   . History of DVT (deep vein thrombosis)   . Tachycardia   . Steroid-induced hyperglycemia   . Hypothyroidism   . Neuropathic pain   . Acute blood loss anemia   . S/P lumbar fusion   . Closed L5 vertebral fracture ([place]) 08/31/2017  . Closed fracture of fifth lumbar vertebra ([place]) 08/30/2017  . Lumbar vertebral fracture ([place]) 07/17/2017  . Scoliosis 06/13/2017    Sumner Boast., PT 06/04/2020, 4:57 PM  [place]. [place], [place], [address] Phone: [phone]   Fax:  [phone]  Name: GIBSON TELLERIA MRN: [id number] Date of Birth: [birth year]

## 2020-06-09 ENCOUNTER — Other Ambulatory Visit: Payer: Self-pay

## 2020-06-09 ENCOUNTER — Ambulatory Visit: Payer: Medicare Other | Admitting: Physical Therapy

## 2020-06-09 ENCOUNTER — Encounter: Payer: Self-pay | Admitting: Physical Therapy

## 2020-06-09 DIAGNOSIS — M545 Low back pain, unspecified: Secondary | ICD-10-CM | POA: Diagnosis not present

## 2020-06-09 DIAGNOSIS — R262 Difficulty in walking, not elsewhere classified: Secondary | ICD-10-CM

## 2020-06-09 DIAGNOSIS — M62830 Muscle spasm of back: Secondary | ICD-10-CM

## 2020-06-09 DIAGNOSIS — M6281 Muscle weakness (generalized): Secondary | ICD-10-CM

## 2020-06-09 NOTE — Therapy (Signed)
[place]. [place], [place], [address] Phone: [phone]   Fax:  [phone]  Physical Therapy Treatment  Patient Details  Name: [name] MRN: [id number] Date of Birth: 02/14/1933 Referring Provider (PT): Elsner   Encounter Date: 06/09/2020   PT End of Session - 06/09/20 1653    Visit Number 19    Date for PT Re-Evaluation 07/03/20    PT Start Time 1614    PT Stop Time 1654    PT Time Calculation (min) 40 min    Activity Tolerance Patient tolerated treatment well    Behavior During Therapy [facility] for tasks assessed/performed           Past Medical History:  Diagnosis Date  . Acalculous cholecystitis 08/24/2018  . Arthritis   . DVT of lower extremity (deep venous thrombosis) ([place])    right leg - after back surgery in 2019  . GERD (gastroesophageal reflux disease)   . History of blood transfusion   . Hypothyroidism   . PONV (postoperative nausea and vomiting)   . Ptosis of both eyelids   . Vertigo   . Wears glasses    reading    Past Surgical History:  Procedure Laterality Date  . ABDOMINAL HYSTERECTOMY  1986  . APPLICATION OF ROBOTIC ASSISTANCE FOR SPINAL PROCEDURE N/A 07/17/2017   Procedure: APPLICATION OF ROBOTIC ASSISTANCE FOR SPINAL PROCEDURE;  Surgeon: Kristeen Miss, MD;  Location: [place];  Service: Neurosurgery;  Laterality: N/A;  . APPLICATION OF ROBOTIC ASSISTANCE FOR SPINAL PROCEDURE N/A 09/01/2017   Procedure: APPLICATION OF ROBOTIC ASSISTANCE FOR SPINAL PROCEDURE;  Surgeon: Kristeen Miss, MD;  Location: [place];  Service: Neurosurgery;  Laterality: N/A;  . [place]  2000   right  . CARPAL TUNNEL RELEASE  2012   left  . CHOLECYSTECTOMY N/A 08/24/2018   Procedure: LAPAROSCOPIC CHOLECYSTECTOMY WITH INTRAOPERATIVE CHOLANGIOGRAM;  Surgeon: Fanny Skates, MD;  Location: [place];  Service: General;  Laterality: N/A;  . COLONOSCOPY    .  CYSTOCELE REPAIR  2007   sling  . DISTAL INTERPHALANGEAL JOINT FUSION Right 02/04/2014   Procedure: FUSION DISTAL INTERPHALANGEAL JOINT RIGHT INDEX FINGER ;  Surgeon: Daryll Brod, MD;  Location: [place];  Service: Orthopedics;  Laterality: Right;  . EYE SURGERY Bilateral    Cataract surgery with lens implant  . KNEE SURGERY  2009,2011   partial knee  . LAPAROSCOPY N/A 08/24/2018   Procedure: LAPAROSCOPY DIAGNOSTIC;  Surgeon: Fanny Skates, MD;  Location: [place];  Service: General;  Laterality: N/A;  . OPEN REDUCTION INTERNAL FIXATION (ORIF) PROXIMAL PHALANX Left 08/30/2013   Procedure: OPEN REDUCTION INTERNAL FIXATION (ORIF) PROXIMAL PHALANX FRACTURE LEFT SMALL FINGER; SPLINT RING FINGER;  Surgeon: Wynonia Sours, MD;  Location: [place];  Service: Orthopedics;  Laterality: Left;  . POSTERIOR LUMBAR FUSION 4 LEVEL N/A 07/17/2017   Procedure: Thoracic Ten - Lumbar Five revison of hardware with Mazor;  Surgeon: Kristeen Miss, MD;  Location: [place];  Service: Neurosurgery;  Laterality: N/A;  Thoracic/Lumbar  . PTOSIS REPAIR Bilateral 07/27/2015   Procedure: PTOSIS REPAIR;  Surgeon: Cristine Polio, MD;  Location: [place];  Service: Plastics;  Laterality: Bilateral;  . SHOULDER SURGERY     rt rcr,and lt  . TONSILLECTOMY    . TOTAL KNEE ARTHROPLASTY Right 12/23/2019   Procedure: TOTAL KNEE ARTHROPLASTY;  Surgeon: Vickey Huger, MD;  [facility];  Service: Orthopedics;  Laterality: Right;    There were no vitals filed for this visit.   Subjective Assessment - 06/09/20 1619    Subjective Pt reports doing well; has appt with MD for ankles tomorrow    Currently in Pain? Yes    Pain Score 5     Pain Location Back                             [facility] Adult PT Treatment/Exercise - 06/09/20 0001      High Level Balance   High Level Balance Comments airex balance beam tandem walk and side stepping, on airex head turns and reaching,  solid airex ball toss, small marches on airex, fwd/bkwd walking      Knee/Hip Exercises: Aerobic   Nustep L5 x 7 min      Knee/Hip Exercises: Machines for Strengthening   Cybex Knee Extension 5# 2x10 with the right leg only    Cybex Knee Flexion 15# 2x10 with the right leg only      Knee/Hip Exercises: Standing   Hip Flexion Both;1 set;10 reps    Hip Flexion Limitations 3#    Hip Abduction Both;2 sets;10 reps    Abduction Limitations 3#    Hip Extension Both;2 sets;10 reps    Extension Limitations 3#    Other Standing Knee Exercises alt step taps 6" stair x10 B    Other Standing Knee Exercises stairs 6" and 4" up/down x2, 2 HR      Knee/Hip Exercises: Seated   Long Arc Quad Both;2 sets;10 reps    Long Arc Quad Weight 3 lbs.    Ball Squeeze x15 with 3 sec hold    Marching Both;2 sets;10 reps    Marching Limitations 3                    PT Short Term Goals - 04/16/20 1529      PT SHORT TERM GOAL #1   Title independent with initial HEP    Status Partially Met             PT Long Term Goals - 06/04/20 1656      PT LONG TERM GOAL #1   Title decrease pain 50%    Status Partially Met      PT LONG TERM GOAL #2   Title increase lumbar ROM 25% with less pain    Status Partially Met                 Plan - 06/09/20 1653    Clinical Impression Statement Pt reports to clinic with increased LBP this rx; states that she did a lot of laundry this morning. MD appt tomorrow for ankles, follow up at next session. Pt demos increased difficulty with backwards walking requiring frequent UE assist in II bars. Posterior LOB with sidestepping on foam. Close supervision-CGA for high level balance activities.    PT Treatment/Interventions ADLs/Self Care Home Management;Cryotherapy;Gait training;Neuromuscular re-education;Balance training;Therapeutic exercise;Therapeutic activities;Functional mobility training;Stair training;Patient/family education;Manual techniques    PT  Next Visit Plan we will work on gait tolerance with the LBP and work on Teacher, music and Agree with Plan of Care Patient           Patient will benefit from skilled therapeutic intervention in order to improve the following deficits and impairments:  Abnormal gait,Decreased range of motion,Decreased balance,Decreased scar mobility,Impaired flexibility,Decreased strength,Decreased mobility,Decreased endurance,Pain,Cardiopulmonary status  limiting activity,Decreased activity tolerance,Postural dysfunction,Improper body mechanics,Increased muscle spasms  Visit Diagnosis: Acute bilateral low back pain without sciatica  Muscle spasm of back  Muscle weakness (generalized)  Difficulty in walking, not elsewhere classified     Problem List Patient Active Problem List   Diagnosis Date Noted  . S/P total knee arthroplasty, right 12/23/2019  . Acalculous cholecystitis 08/24/2018  . Osteoarthritis of right knee   . Vertigo   . DDD (degenerative disc disease), cervical   . Benign essential HTN   . History of DVT (deep vein thrombosis)   . Tachycardia   . Steroid-induced hyperglycemia   . Hypothyroidism   . Neuropathic pain   . Acute blood loss anemia   . S/P lumbar fusion   . Closed L5 vertebral fracture ([place]) 08/31/2017  . Closed fracture of fifth lumbar vertebra ([place]) 08/30/2017  . Lumbar vertebral fracture ([place]) 07/17/2017  . Scoliosis 06/13/2017   Amador Cunas, PT, DPT Donald Prose Sugg 06/09/2020, 5:38 PM  [place]. [place], [place], [address] Phone: [phone]   Fax:  [phone]  Name: [name] MRN: [id number] Date of Birth: Mar 19, 1933

## 2020-06-11 ENCOUNTER — Encounter: Payer: Self-pay | Admitting: Physical Therapy

## 2020-06-11 ENCOUNTER — Ambulatory Visit: Payer: Medicare Other | Admitting: Physical Therapy

## 2020-06-11 ENCOUNTER — Other Ambulatory Visit: Payer: Self-pay

## 2020-06-11 DIAGNOSIS — M545 Low back pain, unspecified: Secondary | ICD-10-CM | POA: Diagnosis not present

## 2020-06-11 DIAGNOSIS — M25561 Pain in right knee: Secondary | ICD-10-CM

## 2020-06-11 DIAGNOSIS — R262 Difficulty in walking, not elsewhere classified: Secondary | ICD-10-CM

## 2020-06-11 DIAGNOSIS — M6281 Muscle weakness (generalized): Secondary | ICD-10-CM

## 2020-06-11 DIAGNOSIS — M62830 Muscle spasm of back: Secondary | ICD-10-CM

## 2020-06-11 NOTE — Therapy (Signed)
[place]. [place], [place], [address] Phone: [phone]   Fax:  [phone]  Physical Therapy Treatment Progress Note Reporting Period 04/30/20 to 06/11/20  See note below for Objective Data and Assessment of Progress/Goals.      Patient Details  Name: [name] MRN: [id number] Date of Birth: 07/10/1932 Referring Provider (PT): Elsner   Encounter Date: 06/11/2020   PT End of Session - 06/11/20 1645    Visit Number 20    Date for PT Re-Evaluation 07/03/20    PT Start Time 1600    PT Stop Time 1645    PT Time Calculation (min) 45 min    Activity Tolerance Patient tolerated treatment well    Behavior During Therapy [facility] for tasks assessed/performed           Past Medical History:  Diagnosis Date  . Acalculous cholecystitis 08/24/2018  . Arthritis   . DVT of lower extremity (deep venous thrombosis) ([place])    right leg - after back surgery in 2019  . GERD (gastroesophageal reflux disease)   . History of blood transfusion   . Hypothyroidism   . PONV (postoperative nausea and vomiting)   . Ptosis of both eyelids   . Vertigo   . Wears glasses    reading    Past Surgical History:  Procedure Laterality Date  . ABDOMINAL HYSTERECTOMY  1986  . APPLICATION OF ROBOTIC ASSISTANCE FOR SPINAL PROCEDURE N/A 07/17/2017   Procedure: APPLICATION OF ROBOTIC ASSISTANCE FOR SPINAL PROCEDURE;  Surgeon: Kristeen Miss, MD;  Location: [place];  Service: Neurosurgery;  Laterality: N/A;  . APPLICATION OF ROBOTIC ASSISTANCE FOR SPINAL PROCEDURE N/A 09/01/2017   Procedure: APPLICATION OF ROBOTIC ASSISTANCE FOR SPINAL PROCEDURE;  Surgeon: Kristeen Miss, MD;  Location: [place];  Service: Neurosurgery;  Laterality: N/A;  . [place]  2000   right  . CARPAL TUNNEL RELEASE  2012   left  . CHOLECYSTECTOMY N/A 08/24/2018   Procedure: LAPAROSCOPIC CHOLECYSTECTOMY WITH INTRAOPERATIVE  CHOLANGIOGRAM;  Surgeon: Fanny Skates, MD;  Location: [place];  Service: General;  Laterality: N/A;  . COLONOSCOPY    . CYSTOCELE REPAIR  2007   sling  . DISTAL INTERPHALANGEAL JOINT FUSION Right 02/04/2014   Procedure: FUSION DISTAL INTERPHALANGEAL JOINT RIGHT INDEX FINGER ;  Surgeon: Daryll Brod, MD;  Location: [place];  Service: Orthopedics;  Laterality: Right;  . EYE SURGERY Bilateral    Cataract surgery with lens implant  . KNEE SURGERY  2009,2011   partial knee  . LAPAROSCOPY N/A 08/24/2018   Procedure: LAPAROSCOPY DIAGNOSTIC;  Surgeon: Fanny Skates, MD;  Location: [place];  Service: General;  Laterality: N/A;  . OPEN REDUCTION INTERNAL FIXATION (ORIF) PROXIMAL PHALANX Left 08/30/2013   Procedure: OPEN REDUCTION INTERNAL FIXATION (ORIF) PROXIMAL PHALANX FRACTURE LEFT SMALL FINGER; SPLINT RING FINGER;  Surgeon: Wynonia Sours, MD;  Location: [place];  Service: Orthopedics;  Laterality: Left;  . POSTERIOR LUMBAR FUSION 4 LEVEL N/A 07/17/2017   Procedure: Thoracic Ten - Lumbar Five revison of hardware with Mazor;  Surgeon: Kristeen Miss, MD;  Location: [place];  Service: Neurosurgery;  Laterality: N/A;  Thoracic/Lumbar  . PTOSIS REPAIR Bilateral 07/27/2015   Procedure: PTOSIS REPAIR;  Surgeon: Cristine Polio, MD;  Location: [place];  Service: Plastics;  Laterality: Bilateral;  . SHOULDER SURGERY     rt rcr,and lt  . TONSILLECTOMY    .  TOTAL KNEE ARTHROPLASTY Right 12/23/2019   Procedure: TOTAL KNEE ARTHROPLASTY;  Surgeon: Vickey Huger, MD;  Location[facility];  Service: Orthopedics;  Laterality: Right;    There were no vitals filed for this visit.   Subjective Assessment - 06/11/20 1603    Subjective Pt reports that she had consult with clinic for neuropathy yesterday; sees the MD to discuss on 3/21.    Currently in Pain? No/denies    Pain Score 0-No pain    Pain Location Back                             [facility] Adult  PT Treatment/Exercise - 06/11/20 0001      High Level Balance   High Level Balance Comments airex balance beam tandem walk and sidestepping, step ups fwd/lat onto airex with 1 HR, rhomberg on airex EO EC, solid airex with ball toss      Knee/Hip Exercises: Aerobic   Nustep L5 x 7 min      Knee/Hip Exercises: Machines for Strengthening   Cybex Knee Extension 5# 2x10 single legs    Cybex Knee Flexion 15# 2x10 single legs      Knee/Hip Exercises: Standing   Heel Raises Both;1 set;15 reps    Heel Raises Limitations 3#    Knee Flexion Both;2 sets;10 reps    Knee Flexion Limitations 3#    Hip Flexion Both;2 sets;10 reps    Hip Flexion Limitations 3#    Hip Abduction Both;2 sets;10 reps    Abduction Limitations 3#    Hip Extension Both;2 sets;10 reps    Extension Limitations 3#      Knee/Hip Exercises: Seated   Long Arc Quad Both;2 sets;10 reps    Long Arc Quad Weight 3 lbs.    Marching Both;2 sets;10 reps    Marching Limitations 3    Sit to [employer] 10 reps;without UE support   with airex to raise heigh in chair                   PT Short Term Goals - 04/16/20 1529      PT SHORT TERM GOAL #1   Title independent with initial HEP    Status Partially Met             PT Long Term Goals - 06/11/20 1647      PT LONG TERM GOAL #1   Title decrease pain 50%    Status Partially Met      PT LONG TERM GOAL #2   Title increase lumbar ROM 25% with less pain    Status Partially Met      PT LONG TERM GOAL #3   Title increase LE strength to 4+/5    Status Partially Met      PT LONG TERM GOAL #4   Title go up and down stairs step over step    Status Achieved      PT LONG TERM GOAL #5   Title decrease TUG to 16 seconds    Status Partially Met                 Plan - 06/11/20 1645    Clinical Impression Statement Pt making progress towards LTG, demos improved stability this rx. Reports that she has follow up for appt about ankles/neuropathy 3/21. Notes she  received injection in R hip. Close supervision-CGA for high level balance activities. Most difficulty with sidestepping on foam. 1UE  required for step ups onto airex. STS with no UE support from raised chair. Continue to progress functional strengthening and balance ex's.    PT Treatment/Interventions ADLs/Self Care Home Management;Cryotherapy;Gait training;Neuromuscular re-education;Balance training;Therapeutic exercise;Therapeutic activities;Functional mobility training;Stair training;Patient/family education;Manual techniques    PT Next Visit Plan we will work on gait tolerance with the LBP and work on Teacher, music and Agree with Plan of Care Patient           Patient will benefit from skilled therapeutic intervention in order to improve the following deficits and impairments:  Abnormal gait,Decreased range of motion,Decreased balance,Decreased scar mobility,Impaired flexibility,Decreased strength,Decreased mobility,Decreased endurance,Pain,Cardiopulmonary status limiting activity,Decreased activity tolerance,Postural dysfunction,Improper body mechanics,Increased muscle spasms  Visit Diagnosis: Acute bilateral low back pain without sciatica  Muscle spasm of back  Muscle weakness (generalized)  Difficulty in walking, not elsewhere classified  Acute pain of right knee     Problem List Patient Active Problem List   Diagnosis Date Noted  . S/P total knee arthroplasty, right 12/23/2019  . Acalculous cholecystitis 08/24/2018  . Osteoarthritis of right knee   . Vertigo   . DDD (degenerative disc disease), cervical   . Benign essential HTN   . History of DVT (deep vein thrombosis)   . Tachycardia   . Steroid-induced hyperglycemia   . Hypothyroidism   . Neuropathic pain   . Acute blood loss anemia   . S/P lumbar fusion   . Closed L5 vertebral fracture ([place]) 08/31/2017  . Closed fracture of fifth lumbar vertebra ([place]) 08/30/2017  . Lumbar vertebral fracture ([place])  07/17/2017  . Scoliosis 06/13/2017   Amador Cunas, PT, DPT [facility] Marcille Barman 06/11/2020, 4:48 PM  [place]. [place], [place], [address] Phone: [phone]   Fax:  [phone]  Name: LATAISHA COLAN MRN: [id number] Date of Birth: September 23, 1932

## 2020-06-16 ENCOUNTER — Other Ambulatory Visit: Payer: Self-pay

## 2020-06-16 ENCOUNTER — Encounter: Payer: Self-pay | Admitting: Physical Therapy

## 2020-06-16 ENCOUNTER — Ambulatory Visit: Payer: Medicare Other | Admitting: Physical Therapy

## 2020-06-16 DIAGNOSIS — M545 Low back pain, unspecified: Secondary | ICD-10-CM

## 2020-06-16 DIAGNOSIS — M6281 Muscle weakness (generalized): Secondary | ICD-10-CM

## 2020-06-16 DIAGNOSIS — M62830 Muscle spasm of back: Secondary | ICD-10-CM

## 2020-06-16 DIAGNOSIS — R262 Difficulty in walking, not elsewhere classified: Secondary | ICD-10-CM

## 2020-06-16 NOTE — Therapy (Signed)
[place]. [place], [place], [address] Phone: [phone]   Fax:  [phone]  Physical Therapy Treatment  Patient Details  Name: [name] MRN: [id number] Date of Birth: 08-16-1932 Referring Provider (PT): Elsner   Encounter Date: 06/16/2020   PT End of Session - 06/16/20 1814    Visit Number 21    Date for PT Re-Evaluation 07/03/20    PT Start Time 5035    PT Stop Time 1649    PT Time Calculation (min) 43 min    Activity Tolerance Patient tolerated treatment well    Behavior During Therapy [facility] for tasks assessed/performed           Past Medical History:  Diagnosis Date  . Acalculous cholecystitis 08/24/2018  . Arthritis   . DVT of lower extremity (deep venous thrombosis) ([place])    right leg - after back surgery in 2019  . GERD (gastroesophageal reflux disease)   . History of blood transfusion   . Hypothyroidism   . PONV (postoperative nausea and vomiting)   . Ptosis of both eyelids   . Vertigo   . Wears glasses    reading    Past Surgical History:  Procedure Laterality Date  . ABDOMINAL HYSTERECTOMY  1986  . APPLICATION OF ROBOTIC ASSISTANCE FOR SPINAL PROCEDURE N/A 07/17/2017   Procedure: APPLICATION OF ROBOTIC ASSISTANCE FOR SPINAL PROCEDURE;  Surgeon: Kristeen Miss, MD;  Location: [place];  Service: Neurosurgery;  Laterality: N/A;  . APPLICATION OF ROBOTIC ASSISTANCE FOR SPINAL PROCEDURE N/A 09/01/2017   Procedure: APPLICATION OF ROBOTIC ASSISTANCE FOR SPINAL PROCEDURE;  Surgeon: Kristeen Miss, MD;  Location: [place];  Service: Neurosurgery;  Laterality: N/A;  . [place]  2000   right  . CARPAL TUNNEL RELEASE  2012   left  . CHOLECYSTECTOMY N/A 08/24/2018   Procedure: LAPAROSCOPIC CHOLECYSTECTOMY WITH INTRAOPERATIVE CHOLANGIOGRAM;  Surgeon: Fanny Skates, MD;  Location: [place];  Service: General;  Laterality: N/A;  . COLONOSCOPY    .  CYSTOCELE REPAIR  2007   sling  . DISTAL INTERPHALANGEAL JOINT FUSION Right 02/04/2014   Procedure: FUSION DISTAL INTERPHALANGEAL JOINT RIGHT INDEX FINGER ;  Surgeon: Daryll Brod, MD;  Location: [place];  Service: Orthopedics;  Laterality: Right;  . EYE SURGERY Bilateral    Cataract surgery with lens implant  . KNEE SURGERY  2009,2011   partial knee  . LAPAROSCOPY N/A 08/24/2018   Procedure: LAPAROSCOPY DIAGNOSTIC;  Surgeon: Fanny Skates, MD;  Location: [place];  Service: General;  Laterality: N/A;  . OPEN REDUCTION INTERNAL FIXATION (ORIF) PROXIMAL PHALANX Left 08/30/2013   Procedure: OPEN REDUCTION INTERNAL FIXATION (ORIF) PROXIMAL PHALANX FRACTURE LEFT SMALL FINGER; SPLINT RING FINGER;  Surgeon: Wynonia Sours, MD;  Location: [place];  Service: Orthopedics;  Laterality: Left;  . POSTERIOR LUMBAR FUSION 4 LEVEL N/A 07/17/2017   Procedure: Thoracic Ten - Lumbar Five revison of hardware with Mazor;  Surgeon: Kristeen Miss, MD;  Location: [place];  Service: Neurosurgery;  Laterality: N/A;  Thoracic/Lumbar  . PTOSIS REPAIR Bilateral 07/27/2015   Procedure: PTOSIS REPAIR;  Surgeon: Cristine Polio, MD;  Location: [place];  Service: Plastics;  Laterality: Bilateral;  . SHOULDER SURGERY     rt rcr,and lt  . TONSILLECTOMY    . TOTAL KNEE ARTHROPLASTY Right 12/23/2019   Procedure: TOTAL KNEE ARTHROPLASTY;  Surgeon: Vickey Huger, MD;  [facility];  Service: Orthopedics;  Laterality: Right;    There were no vitals filed for this visit.   Subjective Assessment - 06/16/20 1617    Subjective Reports that her back is hurting.  Very tired after the last visit    Currently in Pain? Yes    Pain Score 6     Pain Location Back    Pain Orientation Lower    Pain Descriptors / Indicators Sore    Aggravating Factors  activity                             [facility] Adult PT Treatment/Exercise - 06/16/20 0001      Knee/Hip Exercises:  Aerobic   Nustep L6 x 7 min      Knee/Hip Exercises: Standing   Hip Flexion Both;2 sets;10 reps    Hip Flexion Limitations 3#    Hip Abduction Both;2 sets;10 reps    Abduction Limitations 3#    Hip Extension Both;2 sets;10 reps    Extension Limitations 3#      Knee/Hip Exercises: Seated   Other Seated Knee/Hip Exercises on sit fit pelvic mobility, ball in lap isometric abs, pelvic stability, red tband, yellow tband right ankle motions all 2x15      Manual Therapy   Manual Therapy Soft tissue mobilization    Soft tissue mobilization to the SI areas very tener                    PT Short Term Goals - 04/16/20 1529      PT SHORT TERM GOAL #1   Title independent with initial HEP    Status Partially Met             PT Long Term Goals - 06/16/20 1817      PT LONG TERM GOAL #1   Title decrease pain 50%    Status Partially Met      PT LONG TERM GOAL #2   Title increase lumbar ROM 25% with less pain    Status Partially Met      PT LONG TERM GOAL #3   Title increase LE strength to 4+/5    Status Partially Met      PT LONG TERM GOAL #4   Title go up and down stairs step over step    Status Achieved      PT LONG TERM GOAL #5   Title decrease TUG to 16 seconds    Status Partially Met                 Plan - 06/16/20 1814    Clinical Impression Statement Patient reports that she is hurting again in the low back.  C/O tightness, she reports that she feels that the last injection helped some.  We are working on her tolerance to gait with less restrictive device, also working on LE strength and balance.  She was very tender in the SI area and the lumbar paraspinals.  The right ankle is weak and this causes some balance issues as does her decreased strength with the hips.    PT Next Visit Plan we will work on gait tolerance with the LBP and work on balance    Consulted and Agree with Plan of Care Patient           Patient will benefit from skilled  therapeutic intervention in order to improve the following deficits and impairments:  Abnormal gait,Decreased  range of motion,Decreased balance,Decreased scar mobility,Impaired flexibility,Decreased strength,Decreased mobility,Decreased endurance,Pain,Cardiopulmonary status limiting activity,Decreased activity tolerance,Postural dysfunction,Improper body mechanics,Increased muscle spasms  Visit Diagnosis: Acute bilateral low back pain without sciatica  Muscle spasm of back  Muscle weakness (generalized)  Difficulty in walking, not elsewhere classified     Problem List Patient Active Problem List   Diagnosis Date Noted  . S/P total knee arthroplasty, right 12/23/2019  . Acalculous cholecystitis 08/24/2018  . Osteoarthritis of right knee   . Vertigo   . DDD (degenerative disc disease), cervical   . Benign essential HTN   . History of DVT (deep vein thrombosis)   . Tachycardia   . Steroid-induced hyperglycemia   . Hypothyroidism   . Neuropathic pain   . Acute blood loss anemia   . S/P lumbar fusion   . Closed L5 vertebral fracture ([place]) 08/31/2017  . Closed fracture of fifth lumbar vertebra ([place]) 08/30/2017  . Lumbar vertebral fracture ([place]) 07/17/2017  . Scoliosis 06/13/2017    Sumner Boast., PT 06/16/2020, 6:20 PM  [place]. [place], [place], [address] Phone: [phone]   Fax:  [phone]  Name: [name] MRN: [id number] Date of Birth: 02-May-1932

## 2020-06-18 ENCOUNTER — Ambulatory Visit: Payer: Medicare Other | Admitting: Physical Therapy

## 2020-06-18 ENCOUNTER — Other Ambulatory Visit: Payer: Self-pay

## 2020-06-18 ENCOUNTER — Encounter: Payer: Self-pay | Admitting: Physical Therapy

## 2020-06-18 DIAGNOSIS — M545 Low back pain, unspecified: Secondary | ICD-10-CM

## 2020-06-18 DIAGNOSIS — M62830 Muscle spasm of back: Secondary | ICD-10-CM

## 2020-06-18 DIAGNOSIS — M6281 Muscle weakness (generalized): Secondary | ICD-10-CM

## 2020-06-18 DIAGNOSIS — R262 Difficulty in walking, not elsewhere classified: Secondary | ICD-10-CM

## 2020-06-18 NOTE — Therapy (Signed)
[facility]. [place], [place], [address] Phone: [phone]   Fax:  [phone]  Physical Therapy Treatment  Patient Details  Name: [name] MRN: [id number] Date of Birth: Oct 29, 1932 Referring Provider (PT): Elsner   Encounter Date: 06/18/2020   PT End of Session - 06/18/20 1656    Visit Number 22    Date for PT Re-Evaluation 07/03/20    PT Start Time 1605    PT Stop Time 1651    PT Time Calculation (min) 46 min    Activity Tolerance Patient tolerated treatment well    Behavior During Therapy [facility] for tasks assessed/performed           Past Medical History:  Diagnosis Date  . Acalculous cholecystitis 08/24/2018  . Arthritis   . DVT of lower extremity (deep venous thrombosis) ([place])    right leg - after back surgery in 2019  . GERD (gastroesophageal reflux disease)   . History of blood transfusion   . Hypothyroidism   . PONV (postoperative nausea and vomiting)   . Ptosis of both eyelids   . Vertigo   . Wears glasses    reading    Past Surgical History:  Procedure Laterality Date  . ABDOMINAL HYSTERECTOMY  1986  . APPLICATION OF ROBOTIC ASSISTANCE FOR SPINAL PROCEDURE N/A 07/17/2017   Procedure: APPLICATION OF ROBOTIC ASSISTANCE FOR SPINAL PROCEDURE;  Surgeon: Kristeen Miss, MD;  Location: [place];  Service: Neurosurgery;  Laterality: N/A;  . APPLICATION OF ROBOTIC ASSISTANCE FOR SPINAL PROCEDURE N/A 09/01/2017   Procedure: APPLICATION OF ROBOTIC ASSISTANCE FOR SPINAL PROCEDURE;  Surgeon: Kristeen Miss, MD;  Location: [place];  Service: Neurosurgery;  Laterality: N/A;  . [place]  2000   right  . CARPAL TUNNEL RELEASE  2012   left  . CHOLECYSTECTOMY N/A 08/24/2018   Procedure: LAPAROSCOPIC CHOLECYSTECTOMY WITH INTRAOPERATIVE CHOLANGIOGRAM;  Surgeon: Fanny Skates, MD;  Location: [place];  Service: General;  Laterality: N/A;  . COLONOSCOPY    .  CYSTOCELE REPAIR  2007   sling  . DISTAL INTERPHALANGEAL JOINT FUSION Right 02/04/2014   Procedure: FUSION DISTAL INTERPHALANGEAL JOINT RIGHT INDEX FINGER ;  Surgeon: Daryll Brod, MD;  Location: [place];  Service: Orthopedics;  Laterality: Right;  . EYE SURGERY Bilateral    Cataract surgery with lens implant  . KNEE SURGERY  2009,2011   partial knee  . LAPAROSCOPY N/A 08/24/2018   Procedure: LAPAROSCOPY DIAGNOSTIC;  Surgeon: Fanny Skates, MD;  Location: [place];  Service: General;  Laterality: N/A;  . OPEN REDUCTION INTERNAL FIXATION (ORIF) PROXIMAL PHALANX Left 08/30/2013   Procedure: OPEN REDUCTION INTERNAL FIXATION (ORIF) PROXIMAL PHALANX FRACTURE LEFT SMALL FINGER; SPLINT RING FINGER;  Surgeon: Wynonia Sours, MD;  Location: [place];  Service: Orthopedics;  Laterality: Left;  . POSTERIOR LUMBAR FUSION 4 LEVEL N/A 07/17/2017   Procedure: Thoracic Ten - Lumbar Five revison of hardware with Mazor;  Surgeon: Kristeen Miss, MD;  Location: [place];  Service: Neurosurgery;  Laterality: N/A;  Thoracic/Lumbar  . PTOSIS REPAIR Bilateral 07/27/2015   Procedure: PTOSIS REPAIR;  Surgeon: Cristine Polio, MD;  Location: [place];  Service: Plastics;  Laterality: Bilateral;  . SHOULDER SURGERY     rt rcr,and lt  . TONSILLECTOMY    . TOTAL KNEE ARTHROPLASTY Right 12/23/2019   Procedure: TOTAL KNEE ARTHROPLASTY;  Surgeon: Vickey Huger, MD;  [facility];  Service: Orthopedics;  Laterality: Right;    There were no vitals filed for this visit.   Subjective Assessment - 06/18/20 1617    Subjective Patient saw MD yesterday, will have injections next month for the SI area, she is reporting SI pain and hip pain    Currently in Pain? Yes    Pain Score 3     Pain Location Back    Pain Orientation Lower;Left;Right    Pain Descriptors / Indicators Sore    Aggravating Factors  very tender in the SI areas                             [facility]  Adult PT Treatment/Exercise - 06/18/20 0001      Ambulation/Gait   Gait Comments Gait with SPC out in parking lot, SBA, with two SPC's out in the grass, uneven terrain and a curb with CGA, had a few times that she needed assist due to LOB.      High Level Balance   High Level Balance Activities Side stepping;Backward walking    High Level Balance Comments airex standing reaching, head turns, and then some eyes closed, needed very close CGA/SBA due to instability      Knee/Hip Exercises: Aerobic   Nustep L6 x 7 min      Knee/Hip Exercises: Seated   Other Seated Knee/Hip Exercises on sit fit pelvic mobility, ball in lap isometric abs, pelvic stability, red tband, yellow tband right ankle motions all 2x15                    PT Short Term Goals - 04/16/20 1529      PT SHORT TERM GOAL #1   Title independent with initial HEP    Status Partially Met             PT Long Term Goals - 06/18/20 1703      PT LONG TERM GOAL #1   Title decrease pain 50%    Status Partially Met      PT LONG TERM GOAL #2   Title increase lumbar ROM 25% with less pain    Status Partially Met      PT LONG TERM GOAL #3   Title increase LE strength to 4+/5    Status Partially Met      PT LONG TERM GOAL #4   Title go up and down stairs step over step    Status Achieved                 Plan - 06/18/20 1657    Clinical Impression Statement I worked more on gait and balance today, she did well outside on the driveway with a SPC.  She used two SPC's in the grass and uneven terrain, this needed CGA due to the instability.  She has difficulty with standing on the airex.  She is still very tender in the SI area, she reports the knees feeling weak.  I may want to add some LE exercsises back in.    PT Next Visit Plan may add back in some LE strength    Consulted and Agree with Plan of Care Patient           Patient will benefit from skilled therapeutic intervention in order to improve the  following deficits and impairments:  Abnormal gait,Decreased range of motion,Decreased balance,Decreased scar mobility,Impaired flexibility,Decreased strength,Decreased mobility,Decreased endurance,Pain,Cardiopulmonary status limiting activity,Decreased activity tolerance,Postural  dysfunction,Improper body mechanics,Increased muscle spasms  Visit Diagnosis: Acute bilateral low back pain without sciatica  Muscle spasm of back  Muscle weakness (generalized)  Difficulty in walking, not elsewhere classified     Problem List Patient Active Problem List   Diagnosis Date Noted  . S/P total knee arthroplasty, right 12/23/2019  . Acalculous cholecystitis 08/24/2018  . Osteoarthritis of right knee   . Vertigo   . DDD (degenerative disc disease), cervical   . Benign essential HTN   . History of DVT (deep vein thrombosis)   . Tachycardia   . Steroid-induced hyperglycemia   . Hypothyroidism   . Neuropathic pain   . Acute blood loss anemia   . S/P lumbar fusion   . Closed L5 vertebral fracture ([place]) 08/31/2017  . Closed fracture of fifth lumbar vertebra ([place]) 08/30/2017  . Lumbar vertebral fracture ([place]) 07/17/2017  . Scoliosis 06/13/2017    Sumner Boast., PT 06/18/2020, 5:04 PM  [place]. [place], [place], [address] Phone: [phone]   Fax:  [phone]  Name: [name] MRN: [id number] Date of Birth: Aug 08, 1932

## 2020-06-23 ENCOUNTER — Other Ambulatory Visit: Payer: Self-pay

## 2020-06-23 ENCOUNTER — Ambulatory Visit: Payer: Medicare Other | Admitting: Physical Therapy

## 2020-06-23 ENCOUNTER — Encounter: Payer: Self-pay | Admitting: Physical Therapy

## 2020-06-23 DIAGNOSIS — R262 Difficulty in walking, not elsewhere classified: Secondary | ICD-10-CM

## 2020-06-23 DIAGNOSIS — M62830 Muscle spasm of back: Secondary | ICD-10-CM

## 2020-06-23 DIAGNOSIS — M545 Low back pain, unspecified: Secondary | ICD-10-CM

## 2020-06-23 DIAGNOSIS — M6281 Muscle weakness (generalized): Secondary | ICD-10-CM

## 2020-06-23 NOTE — Therapy (Signed)
[place]. [place], [place], [address] Phone: [phone]   Fax:  [phone]  Physical Therapy Treatment  Patient Details  Name: [name] MRN: [id number] Date of Birth: 12/11/1932 Referring Provider (PT): Elsner   Encounter Date: 06/23/2020   PT End of Session - 06/23/20 1732    Visit Number 23    Date for PT Re-Evaluation 07/03/20    PT Start Time 1608    PT Stop Time 1650    PT Time Calculation (min) 42 min    Activity Tolerance Patient tolerated treatment well    Behavior During Therapy [facility] for tasks assessed/performed           Past Medical History:  Diagnosis Date  . Acalculous cholecystitis 08/24/2018  . Arthritis   . DVT of lower extremity (deep venous thrombosis) ([place])    right leg - after back surgery in 2019  . GERD (gastroesophageal reflux disease)   . History of blood transfusion   . Hypothyroidism   . PONV (postoperative nausea and vomiting)   . Ptosis of both eyelids   . Vertigo   . Wears glasses    reading    Past Surgical History:  Procedure Laterality Date  . ABDOMINAL HYSTERECTOMY  1986  . APPLICATION OF ROBOTIC ASSISTANCE FOR SPINAL PROCEDURE N/A 07/17/2017   Procedure: APPLICATION OF ROBOTIC ASSISTANCE FOR SPINAL PROCEDURE;  Surgeon: Kristeen Miss, MD;  Location: [place];  Service: Neurosurgery;  Laterality: N/A;  . APPLICATION OF ROBOTIC ASSISTANCE FOR SPINAL PROCEDURE N/A 09/01/2017   Procedure: APPLICATION OF ROBOTIC ASSISTANCE FOR SPINAL PROCEDURE;  Surgeon: Kristeen Miss, MD;  Location: [place];  Service: Neurosurgery;  Laterality: N/A;  . [place]  2000   right  . CARPAL TUNNEL RELEASE  2012   left  . CHOLECYSTECTOMY N/A 08/24/2018   Procedure: LAPAROSCOPIC CHOLECYSTECTOMY WITH INTRAOPERATIVE CHOLANGIOGRAM;  Surgeon: Fanny Skates, MD;  Location: [place];  Service: General;  Laterality: N/A;  . COLONOSCOPY    .  CYSTOCELE REPAIR  2007   sling  . DISTAL INTERPHALANGEAL JOINT FUSION Right 02/04/2014   Procedure: FUSION DISTAL INTERPHALANGEAL JOINT RIGHT INDEX FINGER ;  Surgeon: Daryll Brod, MD;  Location: [place];  Service: Orthopedics;  Laterality: Right;  . EYE SURGERY Bilateral    Cataract surgery with lens implant  . KNEE SURGERY  2009,2011   partial knee  . LAPAROSCOPY N/A 08/24/2018   Procedure: LAPAROSCOPY DIAGNOSTIC;  Surgeon: Fanny Skates, MD;  Location: [place];  Service: General;  Laterality: N/A;  . OPEN REDUCTION INTERNAL FIXATION (ORIF) PROXIMAL PHALANX Left 08/30/2013   Procedure: OPEN REDUCTION INTERNAL FIXATION (ORIF) PROXIMAL PHALANX FRACTURE LEFT SMALL FINGER; SPLINT RING FINGER;  Surgeon: Wynonia Sours, MD;  Location: [place];  Service: Orthopedics;  Laterality: Left;  . POSTERIOR LUMBAR FUSION 4 LEVEL N/A 07/17/2017   Procedure: Thoracic Ten - Lumbar Five revison of hardware with Mazor;  Surgeon: Kristeen Miss, MD;  Location: [place];  Service: Neurosurgery;  Laterality: N/A;  Thoracic/Lumbar  . PTOSIS REPAIR Bilateral 07/27/2015   Procedure: PTOSIS REPAIR;  Surgeon: Cristine Polio, MD;  Location: [place];  Service: Plastics;  Laterality: Bilateral;  . SHOULDER SURGERY     rt rcr,and lt  . TONSILLECTOMY    . TOTAL KNEE ARTHROPLASTY Right 12/23/2019   Procedure: TOTAL KNEE ARTHROPLASTY;  Surgeon: Vickey Huger, MD;  [facility];  Service: Orthopedics;  Laterality: Right;    There were no vitals filed for this visit.   Subjective Assessment - 06/23/20 1648    Subjective Patient reports did not sleep last night, reports increase LBP over the past two days, she reports that she is unsure of why, her MD injections have been moved up to April 11th    Currently in Pain? Yes    Pain Score 6     Pain Location Back    Pain Orientation Lower    Aggravating Factors  unsure why, reports that she did not think she did much the past few  days                             [facility] Adult PT Treatment/Exercise - 06/23/20 0001      Knee/Hip Exercises: Aerobic   Nustep L6 x 7 min      Knee/Hip Exercises: Seated   Other Seated Knee/Hip Exercises red tband rows and extension on the sit fit, AR press with red tband    Other Seated Knee/Hip Exercises on sit fit pelvic mobility, ball in lap isometric abs, pelvic stability, red tband, yellow tband right ankle motions all 2x15      Manual Therapy   Manual Therapy Soft tissue mobilization    Soft tissue mobilization to the SI areas very tener    Passive ROM PROM of LE's with some gentle distraction to provide some traction to the SI                    PT Short Term Goals - 04/16/20 1529      PT SHORT TERM GOAL #1   Title independent with initial HEP    Status Partially Met             PT Long Term Goals - 06/18/20 1703      PT LONG TERM GOAL #1   Title decrease pain 50%    Status Partially Met      PT LONG TERM GOAL #2   Title increase lumbar ROM 25% with less pain    Status Partially Met      PT LONG TERM GOAL #3   Title increase LE strength to 4+/5    Status Partially Met      PT LONG TERM GOAL #4   Title go up and down stairs step over step    Status Achieved                 Plan - 06/23/20 1733    Clinical Impression Statement Patient with increased pain in the low back today, she reports not sleeping well, reports that she is unsure of why the incresaed pain.  Reports that she is just hurting more today, she has her MD injections moved up to April 11th.  I tried to do gentle sitting pelvic mobiltiy and she reported some pain but after reported it felt better with the motions.  She is very tight in the lumbar paraspinals and tender into the SI area    PT Next Visit Plan may add back in some LE strength    Consulted and Agree with Plan of Care Patient           Patient will benefit from skilled therapeutic intervention  in order to improve the following deficits and impairments:  Abnormal gait,Decreased range of motion,Decreased balance,Decreased scar mobility,Impaired flexibility,Decreased strength,Decreased mobility,Decreased endurance,Pain,Cardiopulmonary status  limiting activity,Decreased activity tolerance,Postural dysfunction,Improper body mechanics,Increased muscle spasms  Visit Diagnosis: Acute bilateral low back pain without sciatica  Muscle spasm of back  Muscle weakness (generalized)  Difficulty in walking, not elsewhere classified     Problem List Patient Active Problem List   Diagnosis Date Noted  . S/P total knee arthroplasty, right 12/23/2019  . Acalculous cholecystitis 08/24/2018  . Osteoarthritis of right knee   . Vertigo   . DDD (degenerative disc disease), cervical   . Benign essential HTN   . History of DVT (deep vein thrombosis)   . Tachycardia   . Steroid-induced hyperglycemia   . Hypothyroidism   . Neuropathic pain   . Acute blood loss anemia   . S/P lumbar fusion   . Closed L5 vertebral fracture ([place]) 08/31/2017  . Closed fracture of fifth lumbar vertebra ([place]) 08/30/2017  . Lumbar vertebral fracture ([place]) 07/17/2017  . Scoliosis 06/13/2017    [place]ner Boast., PT 06/23/2020, 5:35 PM  [place]. [place], [place], [address] Phone: [phone]   Fax:  [phone]  Name: CALYSSA ZOBRIST MRN: [id number] Date of Birth: [birth year]

## 2020-06-25 ENCOUNTER — Other Ambulatory Visit: Payer: Self-pay

## 2020-06-25 ENCOUNTER — Encounter: Payer: Self-pay | Admitting: Physical Therapy

## 2020-06-25 ENCOUNTER — Ambulatory Visit: Payer: Medicare Other | Admitting: Physical Therapy

## 2020-06-25 DIAGNOSIS — M545 Low back pain, unspecified: Secondary | ICD-10-CM | POA: Diagnosis not present

## 2020-06-25 DIAGNOSIS — R262 Difficulty in walking, not elsewhere classified: Secondary | ICD-10-CM

## 2020-06-25 DIAGNOSIS — M62830 Muscle spasm of back: Secondary | ICD-10-CM

## 2020-06-25 DIAGNOSIS — M6281 Muscle weakness (generalized): Secondary | ICD-10-CM

## 2020-06-25 NOTE — Therapy (Signed)
[place]. [place], [place], [address] Phone: [phone]   Fax:  [phone]  Physical Therapy Treatment  Patient Details  Name: [name] MRN: [id number] Date of Birth: January 22, 1933 Referring Provider (PT): Elsner   Encounter Date: 06/25/2020   PT End of Session - 06/25/20 1654    Visit Number 24    Date for PT Re-Evaluation 07/03/20    PT Start Time 1611    PT Stop Time 1701    PT Time Calculation (min) 50 min    Activity Tolerance Patient tolerated treatment well    Behavior During Therapy [facility] for tasks assessed/performed           Past Medical History:  Diagnosis Date  . Acalculous cholecystitis 08/24/2018  . Arthritis   . DVT of lower extremity (deep venous thrombosis) ([place])    right leg - after back surgery in 2019  . GERD (gastroesophageal reflux disease)   . History of blood transfusion   . Hypothyroidism   . PONV (postoperative nausea and vomiting)   . Ptosis of both eyelids   . Vertigo   . Wears glasses    reading    Past Surgical History:  Procedure Laterality Date  . ABDOMINAL HYSTERECTOMY  1986  . APPLICATION OF ROBOTIC ASSISTANCE FOR SPINAL PROCEDURE N/A 07/17/2017   Procedure: APPLICATION OF ROBOTIC ASSISTANCE FOR SPINAL PROCEDURE;  Surgeon: Kristeen Miss, MD;  Location: [place];  Service: Neurosurgery;  Laterality: N/A;  . APPLICATION OF ROBOTIC ASSISTANCE FOR SPINAL PROCEDURE N/A 09/01/2017   Procedure: APPLICATION OF ROBOTIC ASSISTANCE FOR SPINAL PROCEDURE;  Surgeon: Kristeen Miss, MD;  Location: [place];  Service: Neurosurgery;  Laterality: N/A;  . [place]  2000   right  . CARPAL TUNNEL RELEASE  2012   left  . CHOLECYSTECTOMY N/A 08/24/2018   Procedure: LAPAROSCOPIC CHOLECYSTECTOMY WITH INTRAOPERATIVE CHOLANGIOGRAM;  Surgeon: Fanny Skates, MD;  Location: [place];  Service: General;  Laterality: N/A;  . COLONOSCOPY    .  CYSTOCELE REPAIR  2007   sling  . DISTAL INTERPHALANGEAL JOINT FUSION Right 02/04/2014   Procedure: FUSION DISTAL INTERPHALANGEAL JOINT RIGHT INDEX FINGER ;  Surgeon: Daryll Brod, MD;  Location: [place];  Service: Orthopedics;  Laterality: Right;  . EYE SURGERY Bilateral    Cataract surgery with lens implant  . KNEE SURGERY  2009,2011   partial knee  . LAPAROSCOPY N/A 08/24/2018   Procedure: LAPAROSCOPY DIAGNOSTIC;  Surgeon: Fanny Skates, MD;  Location: [place];  Service: General;  Laterality: N/A;  . OPEN REDUCTION INTERNAL FIXATION (ORIF) PROXIMAL PHALANX Left 08/30/2013   Procedure: OPEN REDUCTION INTERNAL FIXATION (ORIF) PROXIMAL PHALANX FRACTURE LEFT SMALL FINGER; SPLINT RING FINGER;  Surgeon: Wynonia Sours, MD;  Location: [place];  Service: Orthopedics;  Laterality: Left;  . POSTERIOR LUMBAR FUSION 4 LEVEL N/A 07/17/2017   Procedure: Thoracic Ten - Lumbar Five revison of hardware with Mazor;  Surgeon: Kristeen Miss, MD;  Location: [place];  Service: Neurosurgery;  Laterality: N/A;  Thoracic/Lumbar  . PTOSIS REPAIR Bilateral 07/27/2015   Procedure: PTOSIS REPAIR;  Surgeon: Cristine Polio, MD;  Location: [place];  Service: Plastics;  Laterality: Bilateral;  . SHOULDER SURGERY     rt rcr,and lt  . TONSILLECTOMY    . TOTAL KNEE ARTHROPLASTY Right 12/23/2019   Procedure: TOTAL KNEE ARTHROPLASTY;  Surgeon: Vickey Huger, MD;  [facility];  Service: Orthopedics;  Laterality: Right;    There were no vitals filed for this visit.   Subjective Assessment - 06/25/20 1616    Subjective I felt better after last time but today my right foot is really numb.    Currently in Pain? Yes    Pain Score 5     Pain Location Back    Pain Orientation Lower    Aggravating Factors  reports pain at night is up to 9-10/10                             [facility] Adult PT Treatment/Exercise - 06/25/20 0001      Ambulation/Gait   Gait Comments  gait with SPC 2x120 feet      Knee/Hip Exercises: Aerobic   Nustep L6 x 7 min      Knee/Hip Exercises: Seated   Other Seated Knee/Hip Exercises right ankle on sit fit ankle motions and then ankle motions in the air    Other Seated Knee/Hip Exercises DF on the right toe taps      Electrical Stimulation   Electrical Stimulation Location right ankle tibia    Electrical Stimulation Action pre mod    Electrical Stimulation Parameters sitting    Electrical Stimulation Goals Pain      Manual Therapy   Manual Therapy Manual Traction    Manual Traction light manual pelvic distraction with the belt trying to see if this is why the new numbness in the right lateral foot and ankle, she reported that this felt good                    PT Short Term Goals - 04/16/20 1529      PT SHORT TERM GOAL #1   Title independent with initial HEP    Status Partially Met             PT Long Term Goals - 06/25/20 1657      PT LONG TERM GOAL #1   Title decrease pain 50%    Status Partially Met      PT LONG TERM GOAL #4   Title go up and down stairs step over step    Status Achieved                 Plan - 06/25/20 1654    Clinical Impression Statement Patient with reports of increased numbness in the right ankle and lateral foot, she reports that this is new.  She has injections scheduled for 4/11.  I tried some light manual pelvic traction, she reports that this felt good on the back and she thought it felt good down the leg.  I also tried some electrical stimulation of the right shin and ankle area to see if this could help stimulate the area    PT Next Visit Plan see how the numbness is and see if she is doing better    Consulted and Agree with Plan of Care Patient           Patient will benefit from skilled therapeutic intervention in order to improve the following deficits and impairments:  Abnormal gait,Decreased range of motion,Decreased balance,Decreased scar  mobility,Impaired flexibility,Decreased strength,Decreased mobility,Decreased endurance,Pain,Cardiopulmonary status limiting activity,Decreased activity tolerance,Postural dysfunction,Improper body mechanics,Increased muscle spasms  Visit Diagnosis: Acute bilateral low back pain without sciatica  Muscle spasm of back  Muscle weakness (generalized)  Difficulty in walking, not elsewhere classified  Problem List Patient Active Problem List   Diagnosis Date Noted  . S/P total knee arthroplasty, right 12/23/2019  . Acalculous cholecystitis 08/24/2018  . Osteoarthritis of right knee   . Vertigo   . DDD (degenerative disc disease), cervical   . Benign essential HTN   . History of DVT (deep vein thrombosis)   . Tachycardia   . Steroid-induced hyperglycemia   . Hypothyroidism   . Neuropathic pain   . Acute blood loss anemia   . S/P lumbar fusion   . Closed L5 vertebral fracture ([place]) 08/31/2017  . Closed fracture of fifth lumbar vertebra ([place]) 08/30/2017  . Lumbar vertebral fracture ([place]) 07/17/2017  . Scoliosis 06/13/2017    [place] Boast., PT 06/25/2020, 4:57 PM  [place]. [place], [place], [address] Phone: [phone]   Fax:  [phone]  Name: [name] MRN: [id number] Date of Birth: 1932-10-04

## 2020-06-29 ENCOUNTER — Ambulatory Visit (INDEPENDENT_AMBULATORY_CARE_PROVIDER_SITE_OTHER): Payer: Medicare Other | Admitting: Otolaryngology

## 2020-06-30 ENCOUNTER — Other Ambulatory Visit: Payer: Self-pay

## 2020-06-30 ENCOUNTER — Encounter: Payer: Self-pay | Admitting: Physical Therapy

## 2020-06-30 ENCOUNTER — Ambulatory Visit: Payer: Medicare Other | Admitting: Physical Therapy

## 2020-06-30 DIAGNOSIS — M545 Low back pain, unspecified: Secondary | ICD-10-CM | POA: Diagnosis not present

## 2020-06-30 DIAGNOSIS — R262 Difficulty in walking, not elsewhere classified: Secondary | ICD-10-CM

## 2020-06-30 DIAGNOSIS — M62830 Muscle spasm of back: Secondary | ICD-10-CM

## 2020-06-30 DIAGNOSIS — M6281 Muscle weakness (generalized): Secondary | ICD-10-CM

## 2020-06-30 NOTE — Therapy (Signed)
[facility]. [place], [place], [address] Phone: [phone]   Fax:  [phone]  Physical Therapy Treatment  Patient Details  Name: BELKIS NORBECK MRN: [id number] Date of Birth: 01/04/1933 Referring Provider (PT): Elsner   Encounter Date: 06/30/2020   PT End of Session - 06/30/20 1658    Visit Number 25    Date for PT Re-Evaluation 07/03/20    PT Start Time 1601    PT Stop Time 1657    PT Time Calculation (min) 50 min    Activity Tolerance Patient tolerated treatment well    Behavior During Therapy [facility] for tasks assessed/performed           Past Medical History:  Diagnosis Date  . Acalculous cholecystitis 08/24/2018  . Arthritis   . DVT of lower extremity (deep venous thrombosis) ([place])    right leg - after back surgery in 2019  . GERD (gastroesophageal reflux disease)   . History of blood transfusion   . Hypothyroidism   . PONV (postoperative nausea and vomiting)   . Ptosis of both eyelids   . Vertigo   . Wears glasses    reading    Past Surgical History:  Procedure Laterality Date  . ABDOMINAL HYSTERECTOMY  1986  . APPLICATION OF ROBOTIC ASSISTANCE FOR SPINAL PROCEDURE N/A 07/17/2017   Procedure: APPLICATION OF ROBOTIC ASSISTANCE FOR SPINAL PROCEDURE;  Surgeon: Kristeen Miss, MD;  Location: [place];  Service: Neurosurgery;  Laterality: N/A;  . APPLICATION OF ROBOTIC ASSISTANCE FOR SPINAL PROCEDURE N/A 09/01/2017   Procedure: APPLICATION OF ROBOTIC ASSISTANCE FOR SPINAL PROCEDURE;  Surgeon: Kristeen Miss, MD;  Location: [place];  Service: Neurosurgery;  Laterality: N/A;  . [place]  2000   right  . CARPAL TUNNEL RELEASE  2012   left  . CHOLECYSTECTOMY N/A 08/24/2018   Procedure: LAPAROSCOPIC CHOLECYSTECTOMY WITH INTRAOPERATIVE CHOLANGIOGRAM;  Surgeon: Fanny Skates, MD;  Location: [place];  Service: General;  Laterality: N/A;  . COLONOSCOPY    .  CYSTOCELE REPAIR  2007   sling  . DISTAL INTERPHALANGEAL JOINT FUSION Right 02/04/2014   Procedure: FUSION DISTAL INTERPHALANGEAL JOINT RIGHT INDEX FINGER ;  Surgeon: Daryll Brod, MD;  Location: [place];  Service: Orthopedics;  Laterality: Right;  . EYE SURGERY Bilateral    Cataract surgery with lens implant  . KNEE SURGERY  2009,2011   partial knee  . LAPAROSCOPY N/A 08/24/2018   Procedure: LAPAROSCOPY DIAGNOSTIC;  Surgeon: Fanny Skates, MD;  Location: [place];  Service: General;  Laterality: N/A;  . OPEN REDUCTION INTERNAL FIXATION (ORIF) PROXIMAL PHALANX Left 08/30/2013   Procedure: OPEN REDUCTION INTERNAL FIXATION (ORIF) PROXIMAL PHALANX FRACTURE LEFT SMALL FINGER; SPLINT RING FINGER;  Surgeon: Wynonia Sours, MD;  Location: [place];  Service: Orthopedics;  Laterality: Left;  . POSTERIOR LUMBAR FUSION 4 LEVEL N/A 07/17/2017   Procedure: Thoracic Ten - Lumbar Five revison of hardware with Mazor;  Surgeon: Kristeen Miss, MD;  Location: [place];  Service: Neurosurgery;  Laterality: N/A;  Thoracic/Lumbar  . PTOSIS REPAIR Bilateral 07/27/2015   Procedure: PTOSIS REPAIR;  Surgeon: Cristine Polio, MD;  Location: [place];  Service: Plastics;  Laterality: Bilateral;  . SHOULDER SURGERY     rt rcr,and lt  . TONSILLECTOMY    . TOTAL KNEE ARTHROPLASTY Right 12/23/2019   Procedure: TOTAL KNEE ARTHROPLASTY;  Surgeon: Vickey Huger, MD;  [facility];  Service: Orthopedics;  Laterality: Right;    There were no vitals filed for this visit.   Subjective Assessment - 06/30/20 1609    Subjective I am still doing better, less numbness and pain reports walking mostly with SPC at home    Currently in Pain? Yes    Pain Score 3     Pain Location Back    Aggravating Factors  night                             [facility] Adult PT Treatment/Exercise - 06/30/20 0001      High Level Balance   High Level Balance Activities Side stepping;Backward  walking;Negotiating over obstacles    High Level Balance Comments balance beam arirex tandem and side stepping, on airex 6" toe touches, on airex reaching, standing ball toss      Knee/Hip Exercises: Aerobic   Nustep L6 x 7 min      Knee/Hip Exercises: Standing   Hip Abduction Both;2 sets;10 reps    Abduction Limitations 3#    Hip Extension Both;2 sets;10 reps    Extension Limitations 3#                    PT Short Term Goals - 04/16/20 1529      PT SHORT TERM GOAL #1   Title independent with initial HEP    Status Partially Met             PT Long Term Goals - 06/25/20 1657      PT LONG TERM GOAL #1   Title decrease pain 50%    Status Partially Met      PT LONG TERM GOAL #4   Title go up and down stairs step over step    Status Achieved                 Plan - 06/30/20 1700    Clinical Impression Statement Patient reports that she has less numbness and pain today.  We worked on balance and activity using only a SPC, she needs SBA, the reason being is that when she looks up and around she will lose her balance.  The right hip and the right ankle remain weak.  This could be long standing from the numerous back surgeries, I feel liek she does get better with these at times but the will fade back if she is not using them.  She tends to want to use the cane in the right hand but her posture and gait look better with it in the left hand    PT Next Visit Plan may need to do renewal    Consulted and Agree with Plan of Care Patient           Patient will benefit from skilled therapeutic intervention in order to improve the following deficits and impairments:  Abnormal gait,Decreased range of motion,Decreased balance,Decreased scar mobility,Impaired flexibility,Decreased strength,Decreased mobility,Decreased endurance,Pain,Cardiopulmonary status limiting activity,Decreased activity tolerance,Postural dysfunction,Improper body mechanics,Increased muscle  spasms  Visit Diagnosis: Acute bilateral low back pain without sciatica  Muscle spasm of back  Muscle weakness (generalized)  Difficulty in walking, not elsewhere classified     Problem List Patient Active Problem List   Diagnosis Date Noted  . S/P total knee arthroplasty, right 12/23/2019  . Acalculous cholecystitis 08/24/2018  . Osteoarthritis of right knee   . Vertigo   . DDD (degenerative disc disease), cervical   .  Benign essential HTN   . History of DVT (deep vein thrombosis)   . Tachycardia   . Steroid-induced hyperglycemia   . Hypothyroidism   . Neuropathic pain   . Acute blood loss anemia   . S/P lumbar fusion   . Closed L5 vertebral fracture ([place]) 08/31/2017  . Closed fracture of fifth lumbar vertebra ([place]) 08/30/2017  . Lumbar vertebral fracture ([place]) 07/17/2017  . Scoliosis 06/13/2017    [place] Boast., PT 06/30/2020, 5:04 PM  [place]. [place], [place], [address] Phone: [phone]   Fax:  [phone]  Name: LUCA BURSTON MRN: [id number] Date of Birth: 01-15-1933

## 2020-07-02 ENCOUNTER — Encounter: Payer: Self-pay | Admitting: Physical Therapy

## 2020-07-02 ENCOUNTER — Ambulatory Visit: Payer: Medicare Other | Admitting: Physical Therapy

## 2020-07-02 ENCOUNTER — Other Ambulatory Visit: Payer: Self-pay

## 2020-07-02 DIAGNOSIS — M62830 Muscle spasm of back: Secondary | ICD-10-CM

## 2020-07-02 DIAGNOSIS — M545 Low back pain, unspecified: Secondary | ICD-10-CM

## 2020-07-02 DIAGNOSIS — M6281 Muscle weakness (generalized): Secondary | ICD-10-CM

## 2020-07-02 DIAGNOSIS — R262 Difficulty in walking, not elsewhere classified: Secondary | ICD-10-CM

## 2020-07-02 NOTE — Therapy (Signed)
[place]. [place], [place], [address] Phone: [phone]   Fax:  [phone]  Physical Therapy Treatment  Patient Details  Name: [name] MRN: [id number] Date of Birth: 1932/07/17 Referring Provider (PT): Elsner   Encounter Date: 07/02/2020   PT End of Session - 07/02/20 1657    Visit Number 26    Date for PT Re-Evaluation 07/03/20    PT Start Time 1610    PT Stop Time 1657    PT Time Calculation (min) 47 min    Activity Tolerance Patient tolerated treatment well    Behavior During Therapy [facility] for tasks assessed/performed           Past Medical History:  Diagnosis Date  . Acalculous cholecystitis 08/24/2018  . Arthritis   . DVT of lower extremity (deep venous thrombosis) ([place])    right leg - after back surgery in 2019  . GERD (gastroesophageal reflux disease)   . History of blood transfusion   . Hypothyroidism   . PONV (postoperative nausea and vomiting)   . Ptosis of both eyelids   . Vertigo   . Wears glasses    reading    Past Surgical History:  Procedure Laterality Date  . ABDOMINAL HYSTERECTOMY  1986  . APPLICATION OF ROBOTIC ASSISTANCE FOR SPINAL PROCEDURE N/A 07/17/2017   Procedure: APPLICATION OF ROBOTIC ASSISTANCE FOR SPINAL PROCEDURE;  Surgeon: Kristeen Miss, MD;  Location: [place];  Service: Neurosurgery;  Laterality: N/A;  . APPLICATION OF ROBOTIC ASSISTANCE FOR SPINAL PROCEDURE N/A 09/01/2017   Procedure: APPLICATION OF ROBOTIC ASSISTANCE FOR SPINAL PROCEDURE;  Surgeon: Kristeen Miss, MD;  Location: [place];  Service: Neurosurgery;  Laterality: N/A;  . [place]  2000   right  . CARPAL TUNNEL RELEASE  2012   left  . CHOLECYSTECTOMY N/A 08/24/2018   Procedure: LAPAROSCOPIC CHOLECYSTECTOMY WITH INTRAOPERATIVE CHOLANGIOGRAM;  Surgeon: Fanny Skates, MD;  Location: [place];  Service: General;  Laterality: N/A;  . COLONOSCOPY    .  CYSTOCELE REPAIR  2007   sling  . DISTAL INTERPHALANGEAL JOINT FUSION Right 02/04/2014   Procedure: FUSION DISTAL INTERPHALANGEAL JOINT RIGHT INDEX FINGER ;  Surgeon: Daryll Brod, MD;  Location: [place];  Service: Orthopedics;  Laterality: Right;  . EYE SURGERY Bilateral    Cataract surgery with lens implant  . KNEE SURGERY  2009,2011   partial knee  . LAPAROSCOPY N/A 08/24/2018   Procedure: LAPAROSCOPY DIAGNOSTIC;  Surgeon: Fanny Skates, MD;  Location: [place];  Service: General;  Laterality: N/A;  . OPEN REDUCTION INTERNAL FIXATION (ORIF) PROXIMAL PHALANX Left 08/30/2013   Procedure: OPEN REDUCTION INTERNAL FIXATION (ORIF) PROXIMAL PHALANX FRACTURE LEFT SMALL FINGER; SPLINT RING FINGER;  Surgeon: Wynonia Sours, MD;  Location: [place];  Service: Orthopedics;  Laterality: Left;  . POSTERIOR LUMBAR FUSION 4 LEVEL N/A 07/17/2017   Procedure: Thoracic Ten - Lumbar Five revison of hardware with Mazor;  Surgeon: Kristeen Miss, MD;  Location: [place];  Service: Neurosurgery;  Laterality: N/A;  Thoracic/Lumbar  . PTOSIS REPAIR Bilateral 07/27/2015   Procedure: PTOSIS REPAIR;  Surgeon: Cristine Polio, MD;  Location: [place];  Service: Plastics;  Laterality: Bilateral;  . SHOULDER SURGERY     rt rcr,and lt  . TONSILLECTOMY    . TOTAL KNEE ARTHROPLASTY Right 12/23/2019   Procedure: TOTAL KNEE ARTHROPLASTY;  Surgeon: Vickey Huger, MD;  [facility];  Service: Orthopedics;  Laterality: Right;    There were no vitals filed for this visit.   Subjective Assessment - 07/02/20 1606    Subjective Patient walked in with a SPC, reports that she feels safe with it if it is level and she knows where she is going, repotrts most places I would use the walker    Currently in Pain? No/denies    Pain Relieving Factors I took celebrex this morning and I feel good.                             [place] Adult PT Treatment/Exercise - 07/02/20 0001       Ambulation/Gait   Gait Comments gait with SPC, cues for hand and for posture, did stairs step over step 4" and 6", use of cane going up and down 4" step, out to car with SPC.      High Level Balance   High Level Balance Activities Side stepping;Backward walking;Negotiating over obstacles    High Level Balance Comments airex standing no holding 2x 1 minute, side stepping over objects with SPC and CGA      Knee/Hip Exercises: Aerobic   Recumbent Bike level 1 3 minutes    Nustep L6 x 7 min      Knee/Hip Exercises: Machines for Strengthening   Cybex Knee Extension 5# 3x10    Cybex Knee Flexion 25# 3x10    Other Machine lat pulls 20#, rows 20# 3x10      Knee/Hip Exercises: Seated   Other Seated Knee/Hip Exercises DF on the right toe taps                    PT Short Term Goals - 04/16/20 1529      PT SHORT TERM GOAL #1   Title independent with initial HEP    Status Partially Met             PT Long Term Goals - 07/02/20 1701      PT LONG TERM GOAL #1   Title decrease pain 50%    Status Achieved                 Plan - 07/02/20 1658    Clinical Impression Statement Patient reports less pain, I am trying to get her to use the cane in the left hand and show her that she is more stable and has less of a limp, she reports that she is just more comfortable with it in the right, she does agree that she limps more.  She is still very fearful of the dynamic surfaces.  And has difficulty going down stairs step over step.  I added back the bike and the weights to try to help with strength since she is not having pain today    PT Next Visit Plan write renewal next visit    Consulted and Agree with Plan of Care Patient           Patient will benefit from skilled therapeutic intervention in order to improve the following deficits and impairments:  Abnormal gait,Decreased range of motion,Decreased balance,Decreased scar mobility,Impaired flexibility,Decreased  strength,Decreased mobility,Decreased endurance,Pain,Cardiopulmonary status limiting activity,Decreased activity tolerance,Postural dysfunction,Improper body mechanics,Increased muscle spasms  Visit Diagnosis: Acute bilateral low back pain without sciatica  Muscle spasm of back  Difficulty in walking, not elsewhere classified  Muscle weakness (generalized)     Problem List Patient Active Problem List  Diagnosis Date Noted  . S/P total knee arthroplasty, right 12/23/2019  . Acalculous cholecystitis 08/24/2018  . Osteoarthritis of right knee   . Vertigo   . DDD (degenerative disc disease), cervical   . Benign essential HTN   . History of DVT (deep vein thrombosis)   . Tachycardia   . Steroid-induced hyperglycemia   . Hypothyroidism   . Neuropathic pain   . Acute blood loss anemia   . S/P lumbar fusion   . Closed L5 vertebral fracture ([place]) 08/31/2017  . Closed fracture of fifth lumbar vertebra ([place]) 08/30/2017  . Lumbar vertebral fracture ([place]) 07/17/2017  . Scoliosis 06/13/2017    Sumner Boast., PT 07/02/2020, 5:03 PM  [address] [place], [place], [address] Phone: [phone]   Fax:  [phone]  Name: [name] MRN: [id number] Date of Birth: July 14, 1932

## 2020-07-07 ENCOUNTER — Other Ambulatory Visit: Payer: Self-pay

## 2020-07-07 ENCOUNTER — Ambulatory Visit: Payer: Medicare Other | Attending: Orthopedic Surgery | Admitting: Physical Therapy

## 2020-07-07 ENCOUNTER — Encounter: Payer: Self-pay | Admitting: Physical Therapy

## 2020-07-07 DIAGNOSIS — M62830 Muscle spasm of back: Secondary | ICD-10-CM | POA: Insufficient documentation

## 2020-07-07 DIAGNOSIS — M6281 Muscle weakness (generalized): Secondary | ICD-10-CM | POA: Diagnosis present

## 2020-07-07 DIAGNOSIS — M545 Low back pain, unspecified: Secondary | ICD-10-CM | POA: Insufficient documentation

## 2020-07-07 DIAGNOSIS — R262 Difficulty in walking, not elsewhere classified: Secondary | ICD-10-CM | POA: Insufficient documentation

## 2020-07-07 NOTE — Therapy (Signed)
[address]. [place], [place], [address] Phone: [phone]   Fax:  [phone]  Physical Therapy Treatment  Patient Details  Name: [name] MRN: [id number] Date of Birth: 02-01-1933 Referring Provider (PT): Elsner   Encounter Date: 07/07/2020   PT End of Session - 07/07/20 1657    Visit Number 27    Date for PT Re-Evaluation 08/06/20    PT Start Time 1610    PT Stop Time 1652    PT Time Calculation (min) 42 min    Activity Tolerance Patient tolerated treatment well    Behavior During Therapy [facility] for tasks assessed/performed           Past Medical History:  Diagnosis Date  . Acalculous cholecystitis 08/24/2018  . Arthritis   . DVT of lower extremity (deep venous thrombosis) ([place])    right leg - after back surgery in 2019  . GERD (gastroesophageal reflux disease)   . History of blood transfusion   . Hypothyroidism   . PONV (postoperative nausea and vomiting)   . Ptosis of both eyelids   . Vertigo   . Wears glasses    reading    Past Surgical History:  Procedure Laterality Date  . ABDOMINAL HYSTERECTOMY  1986  . APPLICATION OF ROBOTIC ASSISTANCE FOR SPINAL PROCEDURE N/A 07/17/2017   Procedure: APPLICATION OF ROBOTIC ASSISTANCE FOR SPINAL PROCEDURE;  Surgeon: Kristeen Miss, MD;  Location: [place];  Service: Neurosurgery;  Laterality: N/A;  . APPLICATION OF ROBOTIC ASSISTANCE FOR SPINAL PROCEDURE N/A 09/01/2017   Procedure: APPLICATION OF ROBOTIC ASSISTANCE FOR SPINAL PROCEDURE;  Surgeon: Kristeen Miss, MD;  Location: [place];  Service: Neurosurgery;  Laterality: N/A;  . [place]  2000   right  . CARPAL TUNNEL RELEASE  2012   left  . CHOLECYSTECTOMY N/A 08/24/2018   Procedure: LAPAROSCOPIC CHOLECYSTECTOMY WITH INTRAOPERATIVE CHOLANGIOGRAM;  Surgeon: Fanny Skates, MD;  Location: [place];  Service: General;  Laterality: N/A;  . COLONOSCOPY    .  CYSTOCELE REPAIR  2007   sling  . DISTAL INTERPHALANGEAL JOINT FUSION Right 02/04/2014   Procedure: FUSION DISTAL INTERPHALANGEAL JOINT RIGHT INDEX FINGER ;  Surgeon: Daryll Brod, MD;  Location: [place];  Service: Orthopedics;  Laterality: Right;  . EYE SURGERY Bilateral    Cataract surgery with lens implant  . KNEE SURGERY  2009,2011   partial knee  . LAPAROSCOPY N/A 08/24/2018   Procedure: LAPAROSCOPY DIAGNOSTIC;  Surgeon: Fanny Skates, MD;  Location: [place];  Service: General;  Laterality: N/A;  . OPEN REDUCTION INTERNAL FIXATION (ORIF) PROXIMAL PHALANX Left 08/30/2013   Procedure: OPEN REDUCTION INTERNAL FIXATION (ORIF) PROXIMAL PHALANX FRACTURE LEFT SMALL FINGER; SPLINT RING FINGER;  Surgeon: Wynonia Sours, MD;  Location: [place];  Service: Orthopedics;  Laterality: Left;  . POSTERIOR LUMBAR FUSION 4 LEVEL N/A 07/17/2017   Procedure: Thoracic Ten - Lumbar Five revison of hardware with Mazor;  Surgeon: Kristeen Miss, MD;  Location: [place];  Service: Neurosurgery;  Laterality: N/A;  Thoracic/Lumbar  . PTOSIS REPAIR Bilateral 07/27/2015   Procedure: PTOSIS REPAIR;  Surgeon: Cristine Polio, MD;  Location: [place];  Service: Plastics;  Laterality: Bilateral;  . SHOULDER SURGERY     rt rcr,and lt  . TONSILLECTOMY    . TOTAL KNEE ARTHROPLASTY Right 12/23/2019   Procedure: TOTAL KNEE ARTHROPLASTY;  Surgeon: Vickey Huger, MD;  [facility];  Service: Orthopedics;  Laterality: Right;    There were no vitals filed for this visit.   Subjective Assessment - 07/07/20 1618    Subjective Patient reports that she went to the cemetery yesterday and cleaned up a gravesite for her deceased daughter.She reports that she is very sore in the LE and the low back    Currently in Pain? Yes    Pain Score 5     Pain Location Back    Aggravating Factors  walking on uneven surfaces and bending over                             [facility] Adult  PT Treatment/Exercise - 07/07/20 0001      Ambulation/Gait   Gait Comments gait with SPC, cues for hand and for posture, did stairs step over step 4" and 6", use of cane going up and down 4" step, SPC 2x 120' cues for posture, SBA/CGA for loss of balance      High Level Balance   High Level Balance Comments airex balance beam side stepping and tandem walk in the [place]      Knee/Hip Exercises: Stretches   Passive Hamstring Stretch Both;3 reps;20 seconds    Gastroc Stretch Both;4 reps;20 seconds      Knee/Hip Exercises: Aerobic   Recumbent Bike level 1 5 minutes    Nustep L6 x 7 min      Knee/Hip Exercises: Machines for Strengthening   Cybex Knee Extension 5# 3x10    Cybex Knee Flexion 25# 3x10    Other Machine lat pulls 20#, rows 20# 3x10      Knee/Hip Exercises: Seated   Other Seated Knee/Hip Exercises right ankle DF, red tband for PF and eversion                    PT Short Term Goals - 04/16/20 1529      PT SHORT TERM GOAL #1   Title independent with initial HEP    Status Partially Met             PT Long Term Goals - 07/07/20 1701      PT LONG TERM GOAL #1   Title decrease pain 50%    Status Achieved      PT LONG TERM GOAL #2   Title increase lumbar ROM 25% with less pain    Status Partially Met      PT LONG TERM GOAL #3   Title increase LE strength to 4+/5    Status Partially Met      PT LONG TERM GOAL #4   Title go up and down stairs step over step    Status Achieved      PT LONG TERM GOAL #5   Title decrease TUG to 16 seconds    Status Partially Met                 Plan - 07/07/20 1657    Clinical Impression Statement Patient reports that she was active yesterday at the cemetary, on uneven terrain for over an hour, she reports being very sore today int eh legs and the back, she then reports that she did some laundry today.  She reports fatigue.  She was visibly tired, requiring SBA/CGA with walking using a SPC due to her fatigue and  loss of balance.  She reports that she was very pleased that she was able to do  so much but was upset that she was so sore and tired.  WE have been working on more independent gait and trying to help posture and back pain, we are progressing with her independence, over the past period she has transitioned to using only a [facility] for most ambulation.  She still at times is unsafe tending to use walls and furniture for balance.  She does have weakness in the left LE, from past multiple back surgeries.    PT Frequency 2x / week    PT Duration 4 weeks    PT Treatment/Interventions ADLs/Self Care Home Management;Cryotherapy;Gait training;Neuromuscular re-education;Balance training;Therapeutic exercise;Therapeutic activities;Functional mobility training;Stair training;Patient/family education;Manual techniques    PT Next Visit Plan will continue to work on independence and safety    Consulted and Agree with Plan of Care Patient           Patient will benefit from skilled therapeutic intervention in order to improve the following deficits and impairments:  Abnormal gait,Decreased range of motion,Decreased balance,Decreased scar mobility,Impaired flexibility,Decreased strength,Decreased mobility,Decreased endurance,Pain,Cardiopulmonary status limiting activity,Decreased activity tolerance,Postural dysfunction,Improper body mechanics,Increased muscle spasms  Visit Diagnosis: Acute bilateral low back pain without sciatica - Plan: PT plan of care cert/re-cert  Muscle spasm of back - Plan: PT plan of care cert/re-cert  Difficulty in walking, not elsewhere classified - Plan: PT plan of care cert/re-cert  Muscle weakness (generalized) - Plan: PT plan of care cert/re-cert     Problem List Patient Active Problem List   Diagnosis Date Noted  . S/P total knee arthroplasty, right 12/23/2019  . Acalculous cholecystitis 08/24/2018  . Osteoarthritis of right knee   . Vertigo   . DDD (degenerative disc  disease), cervical   . Benign essential HTN   . History of DVT (deep vein thrombosis)   . Tachycardia   . Steroid-induced hyperglycemia   . Hypothyroidism   . Neuropathic pain   . Acute blood loss anemia   . S/P lumbar fusion   . Closed L5 vertebral fracture ([place]) 08/31/2017  . Closed fracture of fifth lumbar vertebra ([place]) 08/30/2017  . Lumbar vertebral fracture ([place]) 07/17/2017  . Scoliosis 06/13/2017    Sumner Boast., PT 07/07/2020, 5:04 PM  [address]. [place], [place], [address] Phone: [phone]   Fax:  [phone]  Name: CLEA DUBACH MRN: [id number] Date of Birth: 1933-01-30

## 2020-07-09 ENCOUNTER — Ambulatory Visit: Payer: Medicare Other | Admitting: Physical Therapy

## 2020-07-09 ENCOUNTER — Encounter: Payer: Self-pay | Admitting: Physical Therapy

## 2020-07-09 ENCOUNTER — Other Ambulatory Visit: Payer: Self-pay

## 2020-07-09 DIAGNOSIS — M545 Low back pain, unspecified: Secondary | ICD-10-CM

## 2020-07-09 DIAGNOSIS — M6281 Muscle weakness (generalized): Secondary | ICD-10-CM

## 2020-07-09 DIAGNOSIS — M62830 Muscle spasm of back: Secondary | ICD-10-CM

## 2020-07-09 DIAGNOSIS — R262 Difficulty in walking, not elsewhere classified: Secondary | ICD-10-CM

## 2020-07-09 NOTE — Therapy (Signed)
[address]. [place], [place], [address] Phone: [phone]   Fax:  [phone]  Physical Therapy Treatment  Patient Details  Name: [name] MRN: [id number] Date of Birth: 1933/02/11 Referring Provider (PT): Elsner   Encounter Date: 07/09/2020   PT End of Session - 07/09/20 1652    Visit Number 28    Date for PT Re-Evaluation 08/06/20    PT Start Time 1611    PT Stop Time 1654    PT Time Calculation (min) 43 min    Activity Tolerance Patient tolerated treatment well    Behavior During Therapy [facility] for tasks assessed/performed           Past Medical History:  Diagnosis Date  . Acalculous cholecystitis 08/24/2018  . Arthritis   . DVT of lower extremity (deep venous thrombosis) ([place])    right leg - after back surgery in 2019  . GERD (gastroesophageal reflux disease)   . History of blood transfusion   . Hypothyroidism   . PONV (postoperative nausea and vomiting)   . Ptosis of both eyelids   . Vertigo   . Wears glasses    reading    Past Surgical History:  Procedure Laterality Date  . ABDOMINAL HYSTERECTOMY  1986  . APPLICATION OF ROBOTIC ASSISTANCE FOR SPINAL PROCEDURE N/A 07/17/2017   Procedure: APPLICATION OF ROBOTIC ASSISTANCE FOR SPINAL PROCEDURE;  Surgeon: Kristeen Miss, MD;  Location: [place];  Service: Neurosurgery;  Laterality: N/A;  . APPLICATION OF ROBOTIC ASSISTANCE FOR SPINAL PROCEDURE N/A 09/01/2017   Procedure: APPLICATION OF ROBOTIC ASSISTANCE FOR SPINAL PROCEDURE;  Surgeon: Kristeen Miss, MD;  Location: [place];  Service: Neurosurgery;  Laterality: N/A;  . [place]  2000   right  . CARPAL TUNNEL RELEASE  2012   left  . CHOLECYSTECTOMY N/A 08/24/2018   Procedure: LAPAROSCOPIC CHOLECYSTECTOMY WITH INTRAOPERATIVE CHOLANGIOGRAM;  Surgeon: Fanny Skates, MD;  Location: [place];  Service: General;  Laterality: N/A;  . COLONOSCOPY    .  CYSTOCELE REPAIR  2007   sling  . DISTAL INTERPHALANGEAL JOINT FUSION Right 02/04/2014   Procedure: FUSION DISTAL INTERPHALANGEAL JOINT RIGHT INDEX FINGER ;  Surgeon: Daryll Brod, MD;  Location: [place];  Service: Orthopedics;  Laterality: Right;  . EYE SURGERY Bilateral    Cataract surgery with lens implant  . KNEE SURGERY  2009,2011   partial knee  . LAPAROSCOPY N/A 08/24/2018   Procedure: LAPAROSCOPY DIAGNOSTIC;  Surgeon: Fanny Skates, MD;  Location: [place];  Service: General;  Laterality: N/A;  . OPEN REDUCTION INTERNAL FIXATION (ORIF) PROXIMAL PHALANX Left 08/30/2013   Procedure: OPEN REDUCTION INTERNAL FIXATION (ORIF) PROXIMAL PHALANX FRACTURE LEFT SMALL FINGER; SPLINT RING FINGER;  Surgeon: Wynonia Sours, MD;  Location: [place];  Service: Orthopedics;  Laterality: Left;  . POSTERIOR LUMBAR FUSION 4 LEVEL N/A 07/17/2017   Procedure: Thoracic Ten - Lumbar Five revison of hardware with Mazor;  Surgeon: Kristeen Miss, MD;  Location: [place];  Service: Neurosurgery;  Laterality: N/A;  Thoracic/Lumbar  . PTOSIS REPAIR Bilateral 07/27/2015   Procedure: PTOSIS REPAIR;  Surgeon: Cristine Polio, MD;  Location: [place];  Service: Plastics;  Laterality: Bilateral;  . SHOULDER SURGERY     rt rcr,and lt  . TONSILLECTOMY    . TOTAL KNEE ARTHROPLASTY Right 12/23/2019   Procedure: TOTAL KNEE ARTHROPLASTY;  Surgeon: Vickey Huger, MD;  [facility];  Service: Orthopedics;  Laterality: Right;    There were no vitals filed for this visit.   Subjective Assessment - 07/09/20 1619    Subjective I felt better, the knees and back are feeling better    Currently in Pain? No/denies                             [facility] Adult PT Treatment/Exercise - 07/09/20 0001      Ambulation/Gait   Gait Comments SPC stairs step over step 4"and 6", then we did 2 x 110 feet, walk without device 2x 15 feet      Knee/Hip Exercises: Aerobic   Recumbent  Bike level 1 5 minutes    Nustep L6 x 7 min      Knee/Hip Exercises: Machines for Strengthening   Cybex Knee Extension 5# 3x10    Cybex Knee Flexion 25# 3x10    Other Machine lat pulls 20#, rows 20# 3x10      Knee/Hip Exercises: Seated   Other Seated Knee/Hip Exercises right ankle DF, red tband for PF and eversion                    PT Short Term Goals - 04/16/20 1529      PT SHORT TERM GOAL #1   Title independent with initial HEP    Status Partially Met             PT Long Term Goals - 07/09/20 1657      PT LONG TERM GOAL #1   Title decrease pain 50%    Status Achieved      PT LONG TERM GOAL #2   Title increase lumbar ROM 25% with less pain    Status Partially Met      PT LONG TERM GOAL #3   Title increase LE strength to 4+/5    Status Partially Met                 Plan - 07/09/20 1653    Clinical Impression Statement She is now mostly using the [facility] for gait, she gets off balance when she is tired and tends to go laterally.  The right ankle seems to be moving better but is still very weak for DF.  The biggest issue now is the balance and the back pain, she is going to have an injection in the back next week.  She reports that she is trying to do more at home, I talked with her about D/C at the end of the month and what she needs to be doing at home    PT Next Visit Plan will continue to work on independence and safety    Consulted and Agree with Plan of Care Patient           Patient will benefit from skilled therapeutic intervention in order to improve the following deficits and impairments:  Abnormal gait,Decreased range of motion,Decreased balance,Decreased scar mobility,Impaired flexibility,Decreased strength,Decreased mobility,Decreased endurance,Pain,Cardiopulmonary status limiting activity,Decreased activity tolerance,Postural dysfunction,Improper body mechanics,Increased muscle spasms  Visit Diagnosis: Acute bilateral low back pain without  sciatica  Muscle spasm of back  Difficulty in walking, not elsewhere classified  Muscle weakness (generalized)     Problem List Patient Active Problem List   Diagnosis Date Noted  . S/P total knee arthroplasty, right 12/23/2019  . Acalculous cholecystitis 08/24/2018  . Osteoarthritis of right knee   . Vertigo   . DDD (degenerative  disc disease), cervical   . Benign essential HTN   . History of DVT (deep vein thrombosis)   . Tachycardia   . Steroid-induced hyperglycemia   . Hypothyroidism   . Neuropathic pain   . Acute blood loss anemia   . S/P lumbar fusion   . Closed L5 vertebral fracture ([place]) 08/31/2017  . Closed fracture of fifth lumbar vertebra ([place]) 08/30/2017  . Lumbar vertebral fracture ([place]) 07/17/2017  . Scoliosis 06/13/2017    Sumner Boast., PT 07/09/2020, 4:58 PM  [place]. [place], [place], [address] Phone: [phone]   Fax:  [phone]  Name: [name] MRN: [id number] Date of Birth: 04-22-1932

## 2020-07-14 ENCOUNTER — Encounter: Payer: Medicare Other | Admitting: Physical Therapy

## 2020-07-16 ENCOUNTER — Ambulatory Visit: Payer: Medicare Other | Admitting: Physical Therapy

## 2020-07-20 ENCOUNTER — Encounter (INDEPENDENT_AMBULATORY_CARE_PROVIDER_SITE_OTHER): Payer: Self-pay | Admitting: Otolaryngology

## 2020-07-20 ENCOUNTER — Ambulatory Visit (INDEPENDENT_AMBULATORY_CARE_PROVIDER_SITE_OTHER): Payer: Medicare Other | Admitting: Otolaryngology

## 2020-07-20 ENCOUNTER — Other Ambulatory Visit: Payer: Self-pay

## 2020-07-20 VITALS — Temp 97.3°F

## 2020-07-20 DIAGNOSIS — K141 Geographic tongue: Secondary | ICD-10-CM

## 2020-07-20 NOTE — Progress Notes (Signed)
HPI: [name] is a 87 y.o. female who returns today for evaluation of chronic soreness on the right side of her tongue.  She was seen about 6 weeks ago.  She is still having some discomfort on the right side of her tongue.  On previous visit she was found to have possible geographic tongue.Marland Kitchen  Past Medical History:  Diagnosis Date  . Acalculous cholecystitis 08/24/2018  . Arthritis   . DVT of lower extremity (deep venous thrombosis) ([place])    right leg - after back surgery in 2019  . GERD (gastroesophageal reflux disease)   . History of blood transfusion   . Hypothyroidism   . PONV (postoperative nausea and vomiting)   . Ptosis of both eyelids   . Vertigo   . Wears glasses    reading   Past Surgical History:  Procedure Laterality Date  . ABDOMINAL HYSTERECTOMY  1986  . APPLICATION OF ROBOTIC ASSISTANCE FOR SPINAL PROCEDURE N/A 07/17/2017   Procedure: APPLICATION OF ROBOTIC ASSISTANCE FOR SPINAL PROCEDURE;  Surgeon: Kristeen Miss, MD;  Location: [place];  Service: Neurosurgery;  Laterality: N/A;  . APPLICATION OF ROBOTIC ASSISTANCE FOR SPINAL PROCEDURE N/A 09/01/2017   Procedure: APPLICATION OF ROBOTIC ASSISTANCE FOR SPINAL PROCEDURE;  Surgeon: Kristeen Miss, MD;  Location: [facility] [facility];  Service: Neurosurgery;  Laterality: N/A;  . [place]  2000   right  . CARPAL TUNNEL RELEASE  2012   left  . CHOLECYSTECTOMY N/A 08/24/2018   Procedure: LAPAROSCOPIC CHOLECYSTECTOMY WITH INTRAOPERATIVE CHOLANGIOGRAM;  Surgeon: Fanny Skates, MD;  Location: [place];  Service: General;  Laterality: N/A;  . COLONOSCOPY    . CYSTOCELE REPAIR  2007   sling  . DISTAL INTERPHALANGEAL JOINT FUSION Right 02/04/2014   Procedure: FUSION DISTAL INTERPHALANGEAL JOINT RIGHT INDEX FINGER ;  Surgeon: Daryll Brod, MD;  Location: [place];  Service: Orthopedics;  Laterality: Right;  . EYE SURGERY Bilateral    Cataract surgery with lens implant  . KNEE  SURGERY  2009,2011   partial knee  . LAPAROSCOPY N/A 08/24/2018   Procedure: LAPAROSCOPY DIAGNOSTIC;  Surgeon: Fanny Skates, MD;  Location: [place];  Service: General;  Laterality: N/A;  . OPEN REDUCTION INTERNAL FIXATION (ORIF) PROXIMAL PHALANX Left 08/30/2013   Procedure: OPEN REDUCTION INTERNAL FIXATION (ORIF) PROXIMAL PHALANX FRACTURE LEFT SMALL FINGER; SPLINT RING FINGER;  Surgeon: Wynonia Sours, MD;  Location: [facility];  Service: Orthopedics;  Laterality: Left;  . POSTERIOR LUMBAR FUSION 4 LEVEL N/A 07/17/2017   Procedure: Thoracic Ten - Lumbar Five revison of hardware with Mazor;  Surgeon: Kristeen Miss, MD;  Location: [facility];  Service: Neurosurgery;  Laterality: N/A;  Thoracic/Lumbar  . PTOSIS REPAIR Bilateral 07/27/2015   Procedure: PTOSIS REPAIR;  Surgeon: Cristine Polio, MD;  Location: [facility];  Service: Plastics;  Laterality: Bilateral;  . SHOULDER SURGERY     rt rcr,and lt  . TONSILLECTOMY    . TOTAL KNEE ARTHROPLASTY Right 12/23/2019   Procedure: TOTAL KNEE ARTHROPLASTY;  Surgeon: Vickey Huger, MD;  [facility];  Service: Orthopedics;  Laterality: Right;   Social History   Socioeconomic History  . Marital status: Married    Spouse name: Not on file  . Number of children: Not on file  . Years of education: Not on file  . Highest education level: Not on file  Occupational History  . Not on file  Tobacco Use  . Smoking  status: Former Research scientist (life sciences)  . Smokeless tobacco: Never Used  . Tobacco comment: 07/14/17-  quit for 62  Vaping Use  . Vaping Use: Never used  Substance and Sexual Activity  . Alcohol use: No  . Drug use: No  . Sexual activity: Not on file  Other Topics Concern  . Not on file  Social History Narrative  . Not on file   Social Determinants of Health   Financial Resource Strain: Not on file  Food Insecurity: Not on file  Transportation Needs: Not on file  Physical Activity: Not on file  Stress: Not on file  Social  Connections: Not on file   Family History  Problem Relation Age of Onset  . Breast cancer Neg Hx    Allergies  Allergen Reactions  . Keflex [Cephalexin] Hives   Prior to Admission medications   Medication Sig Start Date End Date Taking? Authorizing Provider  aspirin EC 325 MG tablet Take 1 tablet (325 mg total) by mouth 2 (two) times daily. 12/23/19 12/22/20  Donia Ast, PA  diphenhydramine-acetaminophen (TYLENOL PM) 25-500 MG TABS tablet Take 2 tablets by mouth at bedtime.     [provider]  levothyroxine (SYNTHROID, LEVOTHROID) 50 MCG tablet Take 50 mcg by mouth daily before breakfast.     [provider]  Multiple Vitamin (MULTIVITAMIN WITH MINERALS) TABS tablet Take 1 tablet by mouth at bedtime.    [provider]  OVER THE COUNTER MEDICATION Take 2 capsules by mouth at bedtime. Memory and Nerve Capsules    [provider]  oxyCODONE (OXY IR/ROXICODONE) 5 MG immediate release tablet Take 1 tablet (5 mg total) by mouth every 4 (four) hours as needed for severe pain. 12/23/19   Donia Ast, PA  tiZANidine (ZANAFLEX) 2 MG tablet Take 1 tablet (2 mg total) by mouth every 6 (six) hours as needed for muscle spasms. 12/23/19   Donia Ast, PA  Vitamins-Lipotropics (B-50 PO) Take 1 tablet by mouth at bedtime.    [provider]     Positive ROS: Otherwise negative  All other systems have been reviewed and were otherwise negative with the exception of those mentioned in the HPI and as above.  Physical Exam: Constitutional: Alert, well-appearing, no acute distress Ears: External ears without lesions or tenderness. Ear canals are clear bilaterally with intact, clear TMs.  Nasal: External nose without lesions.. Clear nasal passages Oral: Lips and gums without lesions.  On examination of the tongue she has some geographic type changes on the right lateral side and dorsum of the tongue.  This is all soft to palpation with no  induration and no ulceration.  The posterior oropharynx is clear.  Indirect laryngoscopy revealed a clear hypopharynx and larynx. Neck: No palpable adenopathy or masses Respiratory: Breathing comfortably  Skin: No facial/neck lesions or rash noted.  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  Plan: Prescribed Dukes Magic mouthwash to rinse with 10 cc 3 times daily for 10 days. She is unable to take steroids as she does not tolerate these well. She will follow-up in 1 month for recheck.  If symptoms do not improve consider biopsy in the office of follow-up.   Radene Journey, MD

## 2020-07-21 ENCOUNTER — Ambulatory Visit: Payer: Medicare Other | Admitting: Physical Therapy

## 2020-07-23 ENCOUNTER — Encounter: Payer: Self-pay | Admitting: Physical Therapy

## 2020-07-23 ENCOUNTER — Other Ambulatory Visit: Payer: Self-pay

## 2020-07-23 ENCOUNTER — Ambulatory Visit: Payer: Medicare Other | Admitting: Physical Therapy

## 2020-07-23 DIAGNOSIS — M545 Low back pain, unspecified: Secondary | ICD-10-CM | POA: Diagnosis not present

## 2020-07-23 DIAGNOSIS — M6281 Muscle weakness (generalized): Secondary | ICD-10-CM

## 2020-07-23 DIAGNOSIS — M62830 Muscle spasm of back: Secondary | ICD-10-CM

## 2020-07-23 DIAGNOSIS — R262 Difficulty in walking, not elsewhere classified: Secondary | ICD-10-CM

## 2020-07-23 NOTE — Therapy (Signed)
[place]. [place], [place], [address] Phone: [phone]   Fax:  [phone]  Physical Therapy Treatment  Patient Details  Name: [name] MRN: [id number] Date of Birth: [birth year] Referring Provider (PT): Elsner   Encounter Date: 07/23/2020   PT End of Session - 07/23/20 1700    Visit Number 29    Date for PT Re-Evaluation 08/06/20    PT Start Time 1612    PT Stop Time 1657    PT Time Calculation (min) 45 min    Activity Tolerance Patient tolerated treatment well    Behavior During Therapy [facility] for tasks assessed/performed           Past Medical History:  Diagnosis Date  . Acalculous cholecystitis 08/24/2018  . Arthritis   . DVT of lower extremity (deep venous thrombosis) ([place])    right leg - after back surgery in 2019  . GERD (gastroesophageal reflux disease)   . History of blood transfusion   . Hypothyroidism   . PONV (postoperative nausea and vomiting)   . Ptosis of both eyelids   . Vertigo   . Wears glasses    reading    Past Surgical History:  Procedure Laterality Date  . ABDOMINAL HYSTERECTOMY  1986  . APPLICATION OF ROBOTIC ASSISTANCE FOR SPINAL PROCEDURE N/A 07/17/2017   Procedure: APPLICATION OF ROBOTIC ASSISTANCE FOR SPINAL PROCEDURE;  Surgeon: Kristeen Miss, MD;  Location: [facility];  Service: Neurosurgery;  Laterality: N/A;  . APPLICATION OF ROBOTIC ASSISTANCE FOR SPINAL PROCEDURE N/A 09/01/2017   Procedure: APPLICATION OF ROBOTIC ASSISTANCE FOR SPINAL PROCEDURE;  Surgeon: Kristeen Miss, MD;  Location: [facility];  Service: Neurosurgery;  Laterality: N/A;  . [place]  2000   right  . CARPAL TUNNEL RELEASE  2012   left  . CHOLECYSTECTOMY N/A 08/24/2018   Procedure: LAPAROSCOPIC CHOLECYSTECTOMY WITH INTRAOPERATIVE CHOLANGIOGRAM;  Surgeon: Fanny Skates, MD;  Location: [facility];  Service: General;  Laterality: N/A;  . COLONOSCOPY    .  CYSTOCELE REPAIR  2007   sling  . DISTAL INTERPHALANGEAL JOINT FUSION Right 02/04/2014   Procedure: FUSION DISTAL INTERPHALANGEAL JOINT RIGHT INDEX FINGER ;  Surgeon: Daryll Brod, MD;  Location: [facility];  Service: Orthopedics;  Laterality: Right;  . EYE SURGERY Bilateral    Cataract surgery with lens implant  . KNEE SURGERY  2009,2011   partial knee  . LAPAROSCOPY N/A 08/24/2018   Procedure: LAPAROSCOPY DIAGNOSTIC;  Surgeon: Fanny Skates, MD;  Location: [facility];  Service: General;  Laterality: N/A;  . OPEN REDUCTION INTERNAL FIXATION (ORIF) PROXIMAL PHALANX Left 08/30/2013   Procedure: OPEN REDUCTION INTERNAL FIXATION (ORIF) PROXIMAL PHALANX FRACTURE LEFT SMALL FINGER; SPLINT RING FINGER;  Surgeon: Wynonia Sours, MD;  Location: [facility];  Service: Orthopedics;  Laterality: Left;  . POSTERIOR LUMBAR FUSION 4 LEVEL N/A 07/17/2017   Procedure: Thoracic Ten - Lumbar Five revison of hardware with Mazor;  Surgeon: Kristeen Miss, MD;  Location: [facility];  Service: Neurosurgery;  Laterality: N/A;  Thoracic/Lumbar  . PTOSIS REPAIR Bilateral 07/27/2015   Procedure: PTOSIS REPAIR;  Surgeon: Cristine Polio, MD;  Location: [facility];  Service: Plastics;  Laterality: Bilateral;  . SHOULDER SURGERY     rt rcr,and lt  . TONSILLECTOMY    . TOTAL KNEE ARTHROPLASTY Right 12/23/2019   Procedure: TOTAL KNEE ARTHROPLASTY;  Surgeon: Vickey Huger, MD;  [facility];  Service: Orthopedics;  Laterality: Right;    There were no vitals filed for this visit.   Subjective Assessment - 07/23/20 1616    Subjective Patient reports that she had a injection 2 weeks ago, she reports that her back is feeling better, however she woke up on 07/20/20 with severe right knee pain, she went to the surgeon, x-rays and other tests were negative.  They "felt like I over did it and got it inflammed"  She reports that the pain /back MD has a plan for the pain    Currently in Pain? Yes     Pain Score 5     Pain Location Knee    Pain Orientation Right;Anterior    Pain Descriptors / Indicators Aching;Sore    Aggravating Factors  woke up on Monday with knee pain              [facility] PT Assessment - 07/23/20 0001      Timed Up and Go Test   Normal TUG (seconds) 18    TUG Comments SPC                         [facility] Adult PT Treatment/Exercise - 07/23/20 0001      Ambulation/Gait   Gait Comments gait with SPC and light HHA 2x100'      High Level Balance   High Level Balance Comments airex with red tband scap stab 2 ways      Knee/Hip Exercises: Aerobic   Nustep level 5 x 7 minutes      Knee/Hip Exercises: Machines for Strengthening   Other Machine lat pulls 20#, rows 20# 3x10      Knee/Hip Exercises: Standing   Heel Raises Both;1 set;15 reps    Heel Raises Limitations 3#    Knee Flexion Both;2 sets;10 reps    Knee Flexion Limitations 3#    Hip Flexion Both;2 sets;10 reps    Hip Flexion Limitations 3#    Hip Abduction Both;2 sets;10 reps    Abduction Limitations 3#    Hip Extension Both;2 sets;10 reps    Extension Limitations 3#    Other Standing Knee Exercises alt step taps 6" stair x10 B    Other Standing Knee Exercises stairs 6" and 4" up/down x2, 2 HR      Knee/Hip Exercises: Seated   Other Seated Knee/Hip Exercises right ankle DF, red tband for PF and eversion                    PT Short Term Goals - 04/16/20 1529      PT SHORT TERM GOAL #1   Title independent with initial HEP    Status Partially Met             PT Long Term Goals - 07/23/20 1705      PT LONG TERM GOAL #1   Title decrease pain 50%    Status Achieved      PT LONG TERM GOAL #2   Title increase lumbar ROM 25% with less pain    Status Partially Met      PT LONG TERM GOAL #3   Title increase LE strength to 4+/5    Status Partially Met      PT LONG TERM GOAL #4   Title go up and down stairs step over step    Status Achieved      PT LONG TERM  GOAL #5  Title decrease TUG to 16 seconds    Status Partially Met                 Plan - 07/23/20 1700    Clinical Impression Statement PAtient has not been in to see [place] in two weeks, she had an injection in her low back, She reports that she had to go to the knee surgeon on Monday due to a sudden onset of right knee pain.  X-rays and other testing was negative.  She reports that she is doing better today but still rates pain a 5/10.  She then told me about a fall that she had this AM.  She reports that she used her clothes drying rack and it collapsed on her, I asked her if she was using it for balance and she said yes.    I told her this is why we do not like and educate against using furniture for walking in the home, things may move or collapse.  She has some bruises on both shins.  Her right ankle seemed to move better into DF and eversion. today, she is using only the [facility] but still tends to use the walls for balance    PT Next Visit Plan will continue to work on independence and safety    Consulted and Agree with Plan of Care Patient           Patient will benefit from skilled therapeutic intervention in order to improve the following deficits and impairments:  Abnormal gait,Decreased range of motion,Decreased balance,Decreased scar mobility,Impaired flexibility,Decreased strength,Decreased mobility,Decreased endurance,Pain,Cardiopulmonary status limiting activity,Decreased activity tolerance,Postural dysfunction,Improper body mechanics,Increased muscle spasms  Visit Diagnosis: Acute bilateral low back pain without sciatica  Muscle spasm of back  Difficulty in walking, not elsewhere classified  Muscle weakness (generalized)     Problem List Patient Active Problem List   Diagnosis Date Noted  . S/P total knee arthroplasty, right 12/23/2019  . Acalculous cholecystitis 08/24/2018  . Osteoarthritis of right knee   . Vertigo   . DDD (degenerative disc disease), cervical    . Benign essential HTN   . History of DVT (deep vein thrombosis)   . Tachycardia   . Steroid-induced hyperglycemia   . Hypothyroidism   . Neuropathic pain   . Acute blood loss anemia   . S/P lumbar fusion   . Closed L5 vertebral fracture ([place]) 08/31/2017  . Closed fracture of fifth lumbar vertebra ([place]) 08/30/2017  . Lumbar vertebral fracture ([place]) 07/17/2017  . Scoliosis 06/13/2017    Sumner Boast., PT 07/23/2020, 5:06 PM  [address]. [place], [place], [address] Phone: [phone]   Fax:  [phone]  Name: SHABRIA EGLEY MRN: [id number] Date of Birth: 1932-06-29

## 2020-07-28 ENCOUNTER — Encounter: Payer: Self-pay | Admitting: Physical Therapy

## 2020-07-28 ENCOUNTER — Ambulatory Visit: Payer: Medicare Other | Admitting: Physical Therapy

## 2020-07-28 ENCOUNTER — Other Ambulatory Visit: Payer: Self-pay

## 2020-07-28 DIAGNOSIS — R262 Difficulty in walking, not elsewhere classified: Secondary | ICD-10-CM

## 2020-07-28 DIAGNOSIS — M545 Low back pain, unspecified: Secondary | ICD-10-CM | POA: Diagnosis not present

## 2020-07-28 DIAGNOSIS — M62830 Muscle spasm of back: Secondary | ICD-10-CM

## 2020-07-28 DIAGNOSIS — M6281 Muscle weakness (generalized): Secondary | ICD-10-CM

## 2020-07-28 NOTE — Therapy (Addendum)
[place]. [place], [place], [address] Phone: [phone]   Fax:  [phone] Progress Note Reporting Period 06/18/20 to 07/28/20 for visits 21-30  See note below for Objective Data and Assessment of Progress/Goals.      Physical Therapy Treatment  Patient Details  Name: [name] MRN: [id number] Date of Birth: 10-Dec-1932 Referring Provider (PT): Elsner   Encounter Date: 07/28/2020   PT End of Session - 07/28/20 1140    Visit Number 30    Date for PT Re-Evaluation 08/06/20    PT Start Time 1046    PT Stop Time 1135    PT Time Calculation (min) 49 min    Activity Tolerance Patient tolerated treatment well    Behavior During Therapy [facility] for tasks assessed/performed           Past Medical History:  Diagnosis Date  . Acalculous cholecystitis 08/24/2018  . Arthritis   . DVT of lower extremity (deep venous thrombosis) ([place])    right leg - after back surgery in 2019  . GERD (gastroesophageal reflux disease)   . History of blood transfusion   . Hypothyroidism   . PONV (postoperative nausea and vomiting)   . Ptosis of both eyelids   . Vertigo   . Wears glasses    reading    Past Surgical History:  Procedure Laterality Date  . ABDOMINAL HYSTERECTOMY  1986  . APPLICATION OF ROBOTIC ASSISTANCE FOR SPINAL PROCEDURE N/A 07/17/2017   Procedure: APPLICATION OF ROBOTIC ASSISTANCE FOR SPINAL PROCEDURE;  Surgeon: Kristeen Miss, MD;  Location: [place];  Service: Neurosurgery;  Laterality: N/A;  . APPLICATION OF ROBOTIC ASSISTANCE FOR SPINAL PROCEDURE N/A 09/01/2017   Procedure: APPLICATION OF ROBOTIC ASSISTANCE FOR SPINAL PROCEDURE;  Surgeon: Kristeen Miss, MD;  Location: [place];  Service: Neurosurgery;  Laterality: N/A;  . [place]  2000   right  . CARPAL TUNNEL RELEASE  2012   left  . CHOLECYSTECTOMY N/A 08/24/2018   Procedure: LAPAROSCOPIC CHOLECYSTECTOMY  WITH INTRAOPERATIVE CHOLANGIOGRAM;  Surgeon: Fanny Skates, MD;  Location: [place];  Service: General;  Laterality: N/A;  . COLONOSCOPY    . CYSTOCELE REPAIR  2007   sling  . DISTAL INTERPHALANGEAL JOINT FUSION Right 02/04/2014   Procedure: FUSION DISTAL INTERPHALANGEAL JOINT RIGHT INDEX FINGER ;  Surgeon: Daryll Brod, MD;  Location: [place];  Service: Orthopedics;  Laterality: Right;  . EYE SURGERY Bilateral    Cataract surgery with lens implant  . KNEE SURGERY  2009,2011   partial knee  . LAPAROSCOPY N/A 08/24/2018   Procedure: LAPAROSCOPY DIAGNOSTIC;  Surgeon: Fanny Skates, MD;  Location: [place];  Service: General;  Laterality: N/A;  . OPEN REDUCTION INTERNAL FIXATION (ORIF) PROXIMAL PHALANX Left 08/30/2013   Procedure: OPEN REDUCTION INTERNAL FIXATION (ORIF) PROXIMAL PHALANX FRACTURE LEFT SMALL FINGER; SPLINT RING FINGER;  Surgeon: Wynonia Sours, MD;  Location: [place];  Service: Orthopedics;  Laterality: Left;  . POSTERIOR LUMBAR FUSION 4 LEVEL N/A 07/17/2017   Procedure: Thoracic Ten - Lumbar Five revison of hardware with Mazor;  Surgeon: Kristeen Miss, MD;  Location: [place];  Service: Neurosurgery;  Laterality: N/A;  Thoracic/Lumbar  . PTOSIS REPAIR Bilateral 07/27/2015   Procedure: PTOSIS REPAIR;  Surgeon: Cristine Polio, MD;  Location: [place];  Service: Plastics;  Laterality: Bilateral;  . SHOULDER SURGERY     rt rcr,and lt  .  TONSILLECTOMY    . TOTAL KNEE ARTHROPLASTY Right 12/23/2019   Procedure: TOTAL KNEE ARTHROPLASTY;  Surgeon: Vickey Huger, MD;  [facility];  Service: Orthopedics;  Laterality: Right;    There were no vitals filed for this visit.   Subjective Assessment - 07/28/20 1052    Subjective Reports that the knee is feeling better, she will be seeing MD today about a peripheral nerve stimulator on her leg    Currently in Pain? No/denies                             [facility] Adult PT  Treatment/Exercise - 07/28/20 0001      Ambulation/Gait   Gait Comments gait with 2 SPC's 3x140 feet supervision      High Level Balance   High Level Balance Activities Side stepping    High Level Balance Comments airex with red tband scap stab 2 ways      Knee/Hip Exercises: Aerobic   Nustep level 5 x 7 minutes      Knee/Hip Exercises: Machines for Strengthening   Cybex Knee Extension 5# 3x10    Cybex Knee Flexion 25# 3x10      Knee/Hip Exercises: Standing   Hip Flexion Both;3 sets;10 reps    Hip Flexion Limitations 3#    Hip Abduction Both;10 reps;3 sets    Abduction Limitations 3#    Hip Extension Both;3 sets;10 reps    Extension Limitations 3#      Knee/Hip Exercises: Seated   Other Seated Knee/Hip Exercises right ankle DF, red tband for PF and eversion                    PT Short Term Goals - 04/16/20 1529      PT SHORT TERM GOAL #1   Title independent with initial HEP    Status Partially Met             PT Long Term Goals - 07/23/20 1705      PT LONG TERM GOAL #1   Title decrease pain 50%    Status Achieved      PT LONG TERM GOAL #2   Title increase lumbar ROM 25% with less pain    Status Partially Met      PT LONG TERM GOAL #3   Title increase LE strength to 4+/5    Status Partially Met      PT LONG TERM GOAL #4   Title go up and down stairs step over step    Status Achieved      PT LONG TERM GOAL #5   Title decrease TUG to 16 seconds    Status Partially Met                 Plan - 07/28/20 1141    Clinical Impression Statement Patient doing better, will see the MD today about nerve stimulator.  She still has weakness of the right ankle and hip. she is doing HEP at home to help this, I have encouraged her to do gym after we d/c but also encouraged safety with using walker.She can walk on level surfaces with a SPC, however I still do not trust that she is completely safe as she tends to reach out and hold onto walls or lists to  the left with walking, especially with any distraction    PT Next Visit Plan possible D/C depneding on MD report    Consulted  and Agree with Plan of Care Patient           Patient will benefit from skilled therapeutic intervention in order to improve the following deficits and impairments:  Abnormal gait,Decreased range of motion,Decreased balance,Decreased scar mobility,Impaired flexibility,Decreased strength,Decreased mobility,Decreased endurance,Pain,Cardiopulmonary status limiting activity,Decreased activity tolerance,Postural dysfunction,Improper body mechanics,Increased muscle spasms  Visit Diagnosis: Acute bilateral low back pain without sciatica  Muscle spasm of back  Difficulty in walking, not elsewhere classified  Muscle weakness (generalized)     Problem List Patient Active Problem List   Diagnosis Date Noted  . S/P total knee arthroplasty, right 12/23/2019  . Acalculous cholecystitis 08/24/2018  . Osteoarthritis of right knee   . Vertigo   . DDD (degenerative disc disease), cervical   . Benign essential HTN   . History of DVT (deep vein thrombosis)   . Tachycardia   . Steroid-induced hyperglycemia   . Hypothyroidism   . Neuropathic pain   . Acute blood loss anemia   . S/P lumbar fusion   . Closed L5 vertebral fracture ([place]) 08/31/2017  . Closed fracture of fifth lumbar vertebra ([place]) 08/30/2017  . Lumbar vertebral fracture ([place]) 07/17/2017  . Scoliosis 06/13/2017    Sumner Boast., PT 07/28/2020, 11:46 AM  [address] [place]. [place], [place], [address] Phone: [phone]   Fax:  [phone]  Name: ALYSSABETH BRUSTER MRN: [id number] Date of Birth: [birth year]

## 2020-07-28 NOTE — Progress Notes (Signed)
Will do!

## 2020-07-30 ENCOUNTER — Encounter: Payer: Self-pay | Admitting: Physical Therapy

## 2020-07-30 ENCOUNTER — Ambulatory Visit: Payer: Medicare Other | Admitting: Physical Therapy

## 2020-07-30 ENCOUNTER — Other Ambulatory Visit: Payer: Self-pay

## 2020-07-30 DIAGNOSIS — M545 Low back pain, unspecified: Secondary | ICD-10-CM

## 2020-07-30 DIAGNOSIS — R262 Difficulty in walking, not elsewhere classified: Secondary | ICD-10-CM

## 2020-07-30 DIAGNOSIS — M6281 Muscle weakness (generalized): Secondary | ICD-10-CM

## 2020-07-30 DIAGNOSIS — M62830 Muscle spasm of back: Secondary | ICD-10-CM

## 2020-07-30 NOTE — Therapy (Signed)
[address] [place], [place], [address] Phone: [phone]   Fax:  [phone]  Physical Therapy Treatment  Patient Details  Name: [name] MRN: [id number] Date of Birth: [birth year] Referring Provider (PT): Elsner   Encounter Date: 07/30/2020   PT End of Session - 07/30/20 1716    Visit Number 31    Date for PT Re-Evaluation 08/06/20    PT Start Time 1606    PT Stop Time 1651    PT Time Calculation (min) 45 min    Activity Tolerance Patient tolerated treatment well    Behavior During Therapy [facility] for tasks assessed/performed           Past Medical History:  Diagnosis Date  . Acalculous cholecystitis 08/24/2018  . Arthritis   . DVT of lower extremity (deep venous thrombosis) ([place])    right leg - after back surgery in 2019  . GERD (gastroesophageal reflux disease)   . History of blood transfusion   . Hypothyroidism   . PONV (postoperative nausea and vomiting)   . Ptosis of both eyelids   . Vertigo   . Wears glasses    reading    Past Surgical History:  Procedure Laterality Date  . ABDOMINAL HYSTERECTOMY  1986  . APPLICATION OF ROBOTIC ASSISTANCE FOR SPINAL PROCEDURE N/A 07/17/2017   Procedure: APPLICATION OF ROBOTIC ASSISTANCE FOR SPINAL PROCEDURE;  Surgeon: Kristeen Miss, MD;  Location: [place];  Service: Neurosurgery;  Laterality: N/A;  . APPLICATION OF ROBOTIC ASSISTANCE FOR SPINAL PROCEDURE N/A 09/01/2017   Procedure: APPLICATION OF ROBOTIC ASSISTANCE FOR SPINAL PROCEDURE;  Surgeon: Kristeen Miss, MD;  Location: [place];  Service: Neurosurgery;  Laterality: N/A;  . [place]  2000   right  . CARPAL TUNNEL RELEASE  2012   left  . CHOLECYSTECTOMY N/A 08/24/2018   Procedure: LAPAROSCOPIC CHOLECYSTECTOMY WITH INTRAOPERATIVE CHOLANGIOGRAM;  Surgeon: Fanny Skates, MD;  Location: [place];  Service: General;  Laterality: N/A;  . COLONOSCOPY    .  CYSTOCELE REPAIR  2007   sling  . DISTAL INTERPHALANGEAL JOINT FUSION Right 02/04/2014   Procedure: FUSION DISTAL INTERPHALANGEAL JOINT RIGHT INDEX FINGER ;  Surgeon: Daryll Brod, MD;  Location: [place];  Service: Orthopedics;  Laterality: Right;  . EYE SURGERY Bilateral    Cataract surgery with lens implant  . KNEE SURGERY  2009,2011   partial knee  . LAPAROSCOPY N/A 08/24/2018   Procedure: LAPAROSCOPY DIAGNOSTIC;  Surgeon: Fanny Skates, MD;  Location: [place];  Service: General;  Laterality: N/A;  . OPEN REDUCTION INTERNAL FIXATION (ORIF) PROXIMAL PHALANX Left 08/30/2013   Procedure: OPEN REDUCTION INTERNAL FIXATION (ORIF) PROXIMAL PHALANX FRACTURE LEFT SMALL FINGER; SPLINT RING FINGER;  Surgeon: Wynonia Sours, MD;  Location: [place];  Service: Orthopedics;  Laterality: Left;  . POSTERIOR LUMBAR FUSION 4 LEVEL N/A 07/17/2017   Procedure: Thoracic Ten - Lumbar Five revison of hardware with Mazor;  Surgeon: Kristeen Miss, MD;  Location: [place];  Service: Neurosurgery;  Laterality: N/A;  Thoracic/Lumbar  . PTOSIS REPAIR Bilateral 07/27/2015   Procedure: PTOSIS REPAIR;  Surgeon: Cristine Polio, MD;  Location: [place];  Service: Plastics;  Laterality: Bilateral;  . SHOULDER SURGERY     rt rcr,and lt  . TONSILLECTOMY    . TOTAL KNEE ARTHROPLASTY Right 12/23/2019   Procedure: TOTAL KNEE ARTHROPLASTY;  Surgeon: Vickey Huger, MD;  Location: [facility];  Service: Orthopedics;  Laterality: Right;    There were no vitals filed for this visit.   Subjective Assessment - 07/30/20 1615    Subjective Patient reports that it was not an option for the stimulator for the leg or back.    Currently in Pain? No/denies                             [facility] Adult PT Treatment/Exercise - 07/30/20 0001      Ambulation/Gait   Gait Comments gait up and down stairs step over step with handrail, two SPC's waling outside in the grass and uneven surfaces  with close SBA x 120 feet      High Level Balance   High Level Balance Activities Backward walking;Side stepping    High Level Balance Comments airex with red tband scap stab 2 ways, airex standing cues for posture and some head turns      Knee/Hip Exercises: Aerobic   Nustep level 6 x 7 minutes      Knee/Hip Exercises: Machines for Strengthening   Cybex Knee Extension 5# 3x10    Cybex Knee Flexion 25# 3x10    Other Machine lat pulls 20#, rows 20# 3x10      Knee/Hip Exercises: Standing   Hip Abduction Both;10 reps;3 sets    Abduction Limitations 3#                  PT Education - 07/30/20 1715    Education Details went over safety in the home, fall risks    Person(s) Educated Patient    Methods Explanation;Demonstration    Comprehension Verbalized understanding            PT Short Term Goals - 04/16/20 1529      PT SHORT TERM GOAL #1   Title independent with initial HEP    Status Partially Met             PT Long Term Goals - 07/30/20 1744      PT LONG TERM GOAL #1   Title decrease pain 50%    Status Achieved      PT LONG TERM GOAL #2   Title increase lumbar ROM 25% with less pain    Status Achieved      PT LONG TERM GOAL #3   Title increase LE strength to 4+/5    Status Partially Met      PT LONG TERM GOAL #4   Title go up and down stairs step over step    Status Achieved      PT LONG TERM GOAL #5   Title decrease TUG to 16 seconds    Status Partially Met                 Plan - 07/30/20 1716    Clinical Impression Statement [name] was originally seen starting with a TKA in the fall, we saw her through that but when the new year came we started treating for back pain, we have done this and her back pain is much better, we have been working on blance and strength from the right hip and right ankle, seems nerve related.  She is now walking most distances with a SPC, I still have cautioned her of this as she has had a fall recently  and tends to use furniture and walls to stabilize and her grabbing a moveable object is how she fell, I have  worked with her using two canes, she looks much better with this maintinaing posture and having less "wobble", when using one cane she uses it in the wrong hand and will not change, this does cause more of the wobble due to the weakness in the right hip, her tolerance for walking now is about 150 feet then much more limp and some back fatigue, she still has limited strength of right ankle DF and eversion    PT Next Visit Plan D/C but she will be seeing the neuro surgeon next week and will await his report    Consulted and Agree with Plan of Care Patient           Patient will benefit from skilled therapeutic intervention in order to improve the following deficits and impairments:  Abnormal gait,Decreased range of motion,Decreased balance,Decreased scar mobility,Impaired flexibility,Decreased strength,Decreased mobility,Decreased endurance,Pain,Cardiopulmonary status limiting activity,Decreased activity tolerance,Postural dysfunction,Improper body mechanics,Increased muscle spasms  Visit Diagnosis: Acute bilateral low back pain without sciatica  Muscle spasm of back  Difficulty in walking, not elsewhere classified  Muscle weakness (generalized)     Problem List Patient Active Problem List   Diagnosis Date Noted  . S/P total knee arthroplasty, right 12/23/2019  . Acalculous cholecystitis 08/24/2018  . Osteoarthritis of right knee   . Vertigo   . DDD (degenerative disc disease), cervical   . Benign essential HTN   . History of DVT (deep vein thrombosis)   . Tachycardia   . Steroid-induced hyperglycemia   . Hypothyroidism   . Neuropathic pain   . Acute blood loss anemia   . S/P lumbar fusion   . Closed L5 vertebral fracture ([place]) 08/31/2017  . Closed fracture of fifth lumbar vertebra ([place]) 08/30/2017  . Lumbar vertebral fracture ([place]) 07/17/2017  . Scoliosis 06/13/2017     Sumner Boast., PT 07/30/2020, 5:45 PM  [address] [place], [place], [address] Phone: [phone]   Fax:  [phone]  Name: [name] MRN: [id number] Date of Birth: [birth year]

## 2020-08-13 ENCOUNTER — Ambulatory Visit: Payer: Medicare Other | Admitting: Podiatry

## 2020-09-03 ENCOUNTER — Other Ambulatory Visit (HOSPITAL_COMMUNITY)
Admission: RE | Admit: 2020-09-03 | Discharge: 2020-09-03 | Disposition: A | Discharge status: Home / Self Care | Payer: Medicare Other | Source: Ambulatory Visit | Attending: Otolaryngology | Admitting: Otolaryngology

## 2020-09-03 ENCOUNTER — Ambulatory Visit (INDEPENDENT_AMBULATORY_CARE_PROVIDER_SITE_OTHER): Payer: Medicare Other | Admitting: Otolaryngology

## 2020-09-03 ENCOUNTER — Other Ambulatory Visit: Payer: Self-pay

## 2020-09-03 VITALS — Temp 97.3°F

## 2020-09-03 DIAGNOSIS — D490 Neoplasm of unspecified behavior of digestive system: Secondary | ICD-10-CM | POA: Insufficient documentation

## 2020-09-03 DIAGNOSIS — D3702 Neoplasm of uncertain behavior of tongue: Secondary | ICD-10-CM | POA: Diagnosis not present

## 2020-09-03 DIAGNOSIS — J310 Chronic rhinitis: Secondary | ICD-10-CM

## 2020-09-03 NOTE — Progress Notes (Signed)
HPI: [name] is a 87 y.o. female who returns today for evaluation of persistent soreness on the right side of her tongue.  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  It only affects the right side of her tongue.  It is chronically sore to the patient.  On palpation of this it is soft.  But because of the persistence she presents today for biopsy of the right lateral tongue. She also complains of chronic postnasal drainage and nasal sinus symptoms.  She has tried antihistamines but has not been using any sprays..  Past Medical History:  Diagnosis Date  . Acalculous cholecystitis 08/24/2018  . Arthritis   . DVT of lower extremity (deep venous thrombosis) ([place])    right leg - after back surgery in 2019  . GERD (gastroesophageal reflux disease)   . History of blood transfusion   . Hypothyroidism   . PONV (postoperative nausea and vomiting)   . Ptosis of both eyelids   . Vertigo   . Wears glasses    reading   Past Surgical History:  Procedure Laterality Date  . ABDOMINAL HYSTERECTOMY  1986  . APPLICATION OF ROBOTIC ASSISTANCE FOR SPINAL PROCEDURE N/A 07/17/2017   Procedure: APPLICATION OF ROBOTIC ASSISTANCE FOR SPINAL PROCEDURE;  Surgeon: Kristeen Miss, MD;  Location: [place];  Service: Neurosurgery;  Laterality: N/A;  . APPLICATION OF ROBOTIC ASSISTANCE FOR SPINAL PROCEDURE N/A 09/01/2017   Procedure: APPLICATION OF ROBOTIC ASSISTANCE FOR SPINAL PROCEDURE;  Surgeon: Kristeen Miss, MD;  Location: [place] [place];  Service: Neurosurgery;  Laterality: N/A;  . [place]  2000   right  . CARPAL TUNNEL RELEASE  2012   left  . CHOLECYSTECTOMY N/A 08/24/2018   Procedure: LAPAROSCOPIC CHOLECYSTECTOMY WITH INTRAOPERATIVE CHOLANGIOGRAM;  Surgeon: Fanny Skates, MD;  Location: [place];  Service: General;  Laterality: N/A;  . COLONOSCOPY    . CYSTOCELE REPAIR  2007   sling  . DISTAL INTERPHALANGEAL JOINT FUSION Right  02/04/2014   Procedure: FUSION DISTAL INTERPHALANGEAL JOINT RIGHT INDEX FINGER ;  Surgeon: Daryll Brod, MD;  Location: [place];  Service: Orthopedics;  Laterality: Right;  . EYE SURGERY Bilateral    Cataract surgery with lens implant  . KNEE SURGERY  2009,2011   partial knee  . LAPAROSCOPY N/A 08/24/2018   Procedure: LAPAROSCOPY DIAGNOSTIC;  Surgeon: Fanny Skates, MD;  Location: [place];  Service: General;  Laterality: N/A;  . OPEN REDUCTION INTERNAL FIXATION (ORIF) PROXIMAL PHALANX Left 08/30/2013   Procedure: OPEN REDUCTION INTERNAL FIXATION (ORIF) PROXIMAL PHALANX FRACTURE LEFT SMALL FINGER; SPLINT RING FINGER;  Surgeon: Wynonia Sours, MD;  Location: [place];  Service: Orthopedics;  Laterality: Left;  . POSTERIOR LUMBAR FUSION 4 LEVEL N/A 07/17/2017   Procedure: Thoracic Ten - Lumbar Five revison of hardware with Mazor;  Surgeon: Kristeen Miss, MD;  Location: [place];  Service: Neurosurgery;  Laterality: N/A;  Thoracic/Lumbar  . PTOSIS REPAIR Bilateral 07/27/2015   Procedure: PTOSIS REPAIR;  Surgeon: Cristine Polio, MD;  Location: [place];  Service: Plastics;  Laterality: Bilateral;  . SHOULDER SURGERY     rt rcr,and lt  . TONSILLECTOMY    . TOTAL KNEE ARTHROPLASTY Right 12/23/2019   Procedure: TOTAL KNEE ARTHROPLASTY;  Surgeon: Vickey Huger, MD;  Location: [facility];  Service: Orthopedics;  Laterality: Right;   Social History   Socioeconomic History  . Marital status: Married    Spouse  name: Not on file  . Number of children: Not on file  . Years of education: Not on file  . Highest education level: Not on file  Occupational History  . Not on file  Tobacco Use  . Smoking status: Former Research scientist (life sciences)  . Smokeless tobacco: Never Used  . Tobacco comment: 07/14/17-  quit for 62  Vaping Use  . Vaping Use: Never used  Substance and Sexual Activity  . Alcohol use: No  . Drug use: No  . Sexual activity: Not on file  Other Topics Concern  . Not  on file  Social History Narrative  . Not on file   Social Determinants of Health   Financial Resource Strain: Not on file  Food Insecurity: Not on file  Transportation Needs: Not on file  Physical Activity: Not on file  Stress: Not on file  Social Connections: Not on file   Family History  Problem Relation Age of Onset  . Breast cancer Neg Hx    Allergies  Allergen Reactions  . Keflex [Cephalexin] Hives   Prior to Admission medications   Medication Sig Start Date End Date Taking? Authorizing Provider  aspirin EC 325 MG tablet Take 1 tablet (325 mg total) by mouth 2 (two) times daily. 12/23/19 12/22/20  Donia Ast, PA  diphenhydramine-acetaminophen (TYLENOL PM) 25-500 MG TABS tablet Take 2 tablets by mouth at bedtime.     [provider]  levothyroxine (SYNTHROID, LEVOTHROID) 50 MCG tablet Take 50 mcg by mouth daily before breakfast.     [provider]  Multiple Vitamin (MULTIVITAMIN WITH MINERALS) TABS tablet Take 1 tablet by mouth at bedtime.    [provider]  OVER THE COUNTER MEDICATION Take 2 capsules by mouth at bedtime. Memory and Nerve Capsules    [provider]  oxyCODONE (OXY IR/ROXICODONE) 5 MG immediate release tablet Take 1 tablet (5 mg total) by mouth every 4 (four) hours as needed for severe pain. 12/23/19   Donia Ast, PA  tiZANidine (ZANAFLEX) 2 MG tablet Take 1 tablet (2 mg total) by mouth every 6 (six) hours as needed for muscle spasms. 12/23/19   Donia Ast, PA  Vitamins-Lipotropics (B-50 PO) Take 1 tablet by mouth at bedtime.    [provider]     Positive ROS: Otherwise negative  All other systems have been reviewed and were otherwise negative with the exception of those mentioned in the HPI and as above.  Physical Exam: Constitutional: Alert, well-appearing, no acute distress Ears: External ears without lesions or tenderness. Ear canals are clear bilaterally with intact, clear TMs.   Nasal: External nose without lesions. Septum with minimal septal deformity and moderate rhinitis.. Clear nasal passages Oral: Lips and gums without lesions. Tongue has areas of flat mucosa on the right lateral tongue and slight raised ridges consistent with probable geographic tongue.  This is soft to palpation.  But it has not improved over the past month.  The more sore portion of this is on the lateral portion of the tongue.  A biopsy was obtained from the right lateral tongue.  The area was sprayed with topical anesthetic and then injected with 1 and half cc of Xylocaine with epinephrine.  A 1cm biopsy was obtained with scissors and sent in formalin to pathology to rule out geographic tongue versus neoplasm. Remaining oral exam was clear. Neck: No palpable adenopathy or masses Respiratory: Breathing comfortably  Skin: No facial/neck lesions or rash noted.  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 Chronic rhinitis  Plan: A biopsy of the right lateral tongue was performed in the office today. Patient will call us in 5 to 6 days concerning results of the biopsy. For her nasal sinus symptoms prescribed Flonase 2 sprays each nostril at night along with saline rinses during the day.   Radene Journey, MD

## 2020-09-08 ENCOUNTER — Other Ambulatory Visit (HOSPITAL_COMMUNITY): Payer: Self-pay | Admitting: Neurological Surgery

## 2020-09-08 ENCOUNTER — Other Ambulatory Visit: Payer: Self-pay | Admitting: Neurological Surgery

## 2020-09-08 DIAGNOSIS — M4714 Other spondylosis with myelopathy, thoracic region: Secondary | ICD-10-CM

## 2020-09-11 LAB — SURGICAL PATHOLOGY

## 2020-09-14 ENCOUNTER — Telehealth (INDEPENDENT_AMBULATORY_CARE_PROVIDER_SITE_OTHER): Payer: Self-pay | Admitting: Otolaryngology

## 2020-09-14 NOTE — Telephone Encounter (Signed)
Called patient concerning results of her right lateral tongue biopsy that showed benign mucosa with acute and chronic inflammation and mild hyperplasia consistent with diagnosis of geographic tongue.  No evidence of malignancy or dysplasia.

## 2020-09-15 ENCOUNTER — Ambulatory Visit (HOSPITAL_COMMUNITY)
Admission: RE | Admit: 2020-09-15 | Discharge: 2020-09-15 | Disposition: A | Discharge status: Home / Self Care | Payer: Medicare Other | Source: Ambulatory Visit | Attending: Neurological Surgery | Admitting: Neurological Surgery

## 2020-09-15 ENCOUNTER — Other Ambulatory Visit: Payer: Self-pay

## 2020-09-15 DIAGNOSIS — M4714 Other spondylosis with myelopathy, thoracic region: Secondary | ICD-10-CM | POA: Diagnosis not present

## 2020-09-29 ENCOUNTER — Other Ambulatory Visit: Payer: Self-pay | Admitting: Neurological Surgery

## 2020-10-22 NOTE — Pre-Procedure Instructions (Addendum)
Surgical Instructions    Your procedure is scheduled on Tuesday 10/27/20.   Report to [facility] Main Entrance "A" at 05:30 A.M., then check in with the Admitting office.  Call this number if you have problems the morning of surgery:  [phone]   If you have any questions prior to your surgery date call [phone]: Open Monday-Friday 8am-4pm    Remember:  Do not eat after midnight the night before your surgery  You may drink clear liquids until 04:30 A.M. the morning of your surgery.   Clear liquids allowed are: Water, Non-Citrus Juices (without pulp), Carbonated Beverages, Clear Tea, Black Coffee Only, and Gatorade     Take these medicines the morning of surgery with A SIP OF WATER   levothyroxine (SYNTHROID, LEVOTHROID)  omeprazole (PRILOSEC)  Polyethyl Glycol-Propyl Glycol (SYSTANE OP)- If needed    As of today, STOP taking any Aspirin (unless otherwise instructed by your surgeon) Aleve, Naproxen, Ibuprofen, Motrin, Advil, Goody's, BC's, all herbal medications, fish oil, and all vitamins.                     Do NOT Smoke (Tobacco/Vaping) or drink Alcohol 24 hours prior to your procedure.  If you use a CPAP at night, you may bring all equipment for your overnight stay.   Contacts, glasses, piercing's, hearing aid's, dentures or partials may not be worn into surgery, please bring cases for these belongings.    For patients admitted to the hospital, discharge time will be determined by your treatment team.   Patients discharged the day of surgery will not be allowed to drive home, and someone needs to stay with them for 24 hours.  ONLY 1 SUPPORT PERSON MAY BE PRESENT WHILE YOU ARE IN SURGERY. IF YOU ARE TO BE ADMITTED ONCE YOU ARE IN YOUR ROOM YOU WILL BE ALLOWED TWO (2) VISITORS.  Minor children may have two parents present. Special consideration for safety and communication needs will be reviewed on a case by case basis.   Special instructions:   [place]- Preparing  For Surgery  Before surgery, you can play an important role. Because skin is not sterile, your skin needs to be as free of germs as possible. You can reduce the number of germs on your skin by washing with CHG (chlorahexidine gluconate) Soap before surgery.  CHG is an antiseptic cleaner which kills germs and bonds with the skin to continue killing germs even after washing.    Oral Hygiene is also important to reduce your risk of infection.  Remember - BRUSH YOUR TEETH THE MORNING OF SURGERY WITH YOUR REGULAR TOOTHPASTE  Please do not use if you have an allergy to CHG or antibacterial soaps. If your skin becomes reddened/irritated stop using the CHG.  Do not shave (including legs and underarms) for at least 48 hours prior to first CHG shower. It is OK to shave your face.  Please follow these instructions carefully.   Shower the NIGHT BEFORE SURGERY and the MORNING OF SURGERY  If you chose to wash your hair, wash your hair first as usual with your normal shampoo.  After you shampoo, rinse your hair and body thoroughly to remove the shampoo.  Use CHG Soap as you would any other liquid soap. You can apply CHG directly to the skin and wash gently with a scrungie or a clean washcloth.   Apply the CHG Soap to your body ONLY FROM THE NECK DOWN.  Do not use on open wounds or  open sores. Avoid contact with your eyes, ears, mouth and genitals (private parts). Wash Face and genitals (private parts)  with your normal soap.   Wash thoroughly, paying special attention to the area where your surgery will be performed.  Thoroughly rinse your body with warm water from the neck down.  DO NOT shower/wash with your normal soap after using and rinsing off the CHG Soap.  Pat yourself dry with a CLEAN TOWEL.  Wear CLEAN PAJAMAS to bed the night before surgery  Place CLEAN SHEETS on your bed the night before your surgery  DO NOT SLEEP WITH PETS.   Day of Surgery: Shower with CHG soap. Do not wear  jewelry, make up, nail polish, gel polish, artificial nails, or any other type of covering on natural nails including finger and toenails. If patients have artificial nails, gel coating, etc. that need to be removed by a nail salon please have this removed prior to surgery. Surgery may need to be canceled/delayed if the surgeon/ anesthesia feels like the patient is unable to be adequately monitored. Do not wear lotions, powders, perfumes/colognes, or deodorant. Do not shave 48 hours prior to surgery.  Men may shave face and neck. Do not bring valuables to the hospital. [facility] is not responsible for any belongings or valuables. Wear Clean/Comfortable clothing the morning of surgery Remember to brush your teeth WITH YOUR REGULAR TOOTHPASTE.   Please read over the following fact sheets that you were given.

## 2020-10-23 ENCOUNTER — Encounter (HOSPITAL_COMMUNITY)
Admission: RE | Admit: 2020-10-23 | Discharge: 2020-10-23 | Disposition: A | Discharge status: Home / Self Care | Payer: Medicare Other | Source: Ambulatory Visit | Attending: Neurological Surgery | Admitting: Neurological Surgery

## 2020-10-23 ENCOUNTER — Other Ambulatory Visit: Payer: Self-pay

## 2020-10-23 ENCOUNTER — Encounter (HOSPITAL_COMMUNITY): Payer: Self-pay

## 2020-10-23 DIAGNOSIS — Z7982 Long term (current) use of aspirin: Secondary | ICD-10-CM | POA: Diagnosis not present

## 2020-10-23 DIAGNOSIS — Z79890 Hormone replacement therapy (postmenopausal): Secondary | ICD-10-CM | POA: Insufficient documentation

## 2020-10-23 DIAGNOSIS — K219 Gastro-esophageal reflux disease without esophagitis: Secondary | ICD-10-CM | POA: Insufficient documentation

## 2020-10-23 DIAGNOSIS — Z79899 Other long term (current) drug therapy: Secondary | ICD-10-CM | POA: Insufficient documentation

## 2020-10-23 DIAGNOSIS — Z79891 Long term (current) use of opiate analgesic: Secondary | ICD-10-CM | POA: Insufficient documentation

## 2020-10-23 DIAGNOSIS — Z01818 Encounter for other preprocedural examination: Secondary | ICD-10-CM | POA: Diagnosis present

## 2020-10-23 DIAGNOSIS — Z87891 Personal history of nicotine dependence: Secondary | ICD-10-CM | POA: Insufficient documentation

## 2020-10-23 DIAGNOSIS — Z20822 Contact with and (suspected) exposure to covid-19: Secondary | ICD-10-CM | POA: Insufficient documentation

## 2020-10-23 DIAGNOSIS — Z96651 Presence of right artificial knee joint: Secondary | ICD-10-CM | POA: Insufficient documentation

## 2020-10-23 DIAGNOSIS — M4714 Other spondylosis with myelopathy, thoracic region: Secondary | ICD-10-CM | POA: Diagnosis not present

## 2020-10-23 DIAGNOSIS — Z981 Arthrodesis status: Secondary | ICD-10-CM | POA: Diagnosis not present

## 2020-10-23 DIAGNOSIS — R42 Dizziness and giddiness: Secondary | ICD-10-CM | POA: Insufficient documentation

## 2020-10-23 DIAGNOSIS — E039 Hypothyroidism, unspecified: Secondary | ICD-10-CM | POA: Insufficient documentation

## 2020-10-23 LAB — BASIC METABOLIC PANEL
Anion gap: 8 (ref 5–15)
BUN: 10 mg/dL (ref 8–23)
CO2: 27 mmol/L (ref 22–32)
Calcium: 9.3 mg/dL (ref 8.9–10.3)
Chloride: 104 mmol/L (ref 98–111)
Creatinine, Ser: 0.76 mg/dL (ref 0.44–1.00)
GFR, Estimated: >60 mL/min (ref >60)
Glucose, Bld: 99 mg/dL (ref 70–99)
Potassium: 4 mmol/L (ref 3.5–5.1)
Sodium: 139 mmol/L (ref 135–145)

## 2020-10-23 LAB — TYPE AND SCREEN
ABO/RH(D): A POS
Antibody Screen: NEGATIVE

## 2020-10-23 LAB — CBC
HCT: 39.3 % (ref 36.0–46.0)
Hemoglobin: 12.6 g/dL (ref 12.0–15.0)
MCH: 30.5 pg (ref 26.0–34.0)
MCHC: 32.1 g/dL (ref 30.0–36.0)
MCV: 95.2 fL (ref 80.0–100.0)
Platelets: 300 10*3/uL (ref 150–400)
RBC: 4.13 MIL/uL (ref 3.87–5.11)
RDW: 13.1 % (ref 11.5–15.5)
WBC: 6 10*3/uL (ref 4.0–10.5)
nRBC: 0 % (ref 0.0–0.2)

## 2020-10-23 LAB — SURGICAL PCR SCREEN
MRSA, PCR: NEGATIVE
Staphylococcus aureus: NEGATIVE

## 2020-10-23 LAB — SARS CORONAVIRUS 2 (TAT 6-24 HRS): SARS Coronavirus 2: NEGATIVE

## 2020-10-23 NOTE — Progress Notes (Addendum)
PCP - Dr. Lorene Dy Cardiologist - denies  PPM/ICD - n/a Device Orders - n/a Rep Notified - n/a  Chest x-ray - n/a EKG - n/a Stress Test - n/a ECHO - n/a Cardiac Cath - n/a  Sleep Study - denies CPAP - n/a  Fasting Blood Sugar - n/a Checks Blood Sugar _ n/a __times a day  Blood Thinner Instructions: n/a Aspirin Instructions: As of today stop taking any Aspirin unless otherwise instructed by your surgeon.  ERAS Protcol - Yes PRE-SURGERY Ensure or G2- None  COVID TEST- Yes. 10/23/20 at PAT appointment. Pending.   Anesthesia review: Yes. Hx DVT. PONV. EKG Review  Patient denies shortness of breath, fever, cough and chest pain at PAT appointment   All instructions explained to the patient, with a verbal understanding of the material. Patient agrees to go over the instructions while at home for a better understanding. Patient also instructed to self quarantine after being tested for COVID-19. The opportunity to ask questions was provided.

## 2020-10-26 NOTE — Progress Notes (Signed)
Anesthesia Chart Review:  Case: [id number] Date/Time: 10/27/20 0715   Procedures:      Thoracic 9-10 Laminectomy with extension of fusion from Thoracic 10 to Thoracic 6 with segmental fixation with pedicle screw and mazor - RM 19     APPLICATION OF ROBOTIC ASSISTANCE FOR SPINAL PROCEDURE   Anesthesia type: General   Pre-op diagnosis: Other spondylosis with myelopathy, Thoracic region   Location: [facility] OR ROOM 97 / [place] OR   Surgeons: Kristeen Miss, MD       DISCUSSION: Patient is an 87 year old female scheduled for the above procedure.  History includes former smoker, post-operative N/V, hypothyroidism, GERD, vertigo, postoperative DVT (RLE 08/03/2017), TKA (right 9/920/21), cholecystectomy (08/24/18), back surgery ( L2-5 PLIF 06/13/17; developed unstable L2 fracture, s/p removal of L2 posterior fixation/pedicle screws in L2, placement of pedicular fixation T10-L1, reconfiguration of stabilization from T10-L5 with posterior rods posterior arthrodesis with autograft and infuse from T10-L2 07/17/17; developed L5 fracture, s/p revision of fixation T10-ileum, placement of S1 and S2 alar screws, decompression of the left L5 nerve root, arthrodesis with infuse L5-S1, replacement of right L3 pedicle screw 09/01/17).  Preoperative labs within normal limits.  EKG shows stable right bundle branch block.  10/23/2020 presurgical COVID-19 test negative.  Anesthesia team to evaluate on the day of surgery.  VS: BP 127/86   Pulse 95   Temp 36.7 C (Oral)   Resp 18   Ht '5\' 6"'$  (1.676 m)   Wt 82.9 kg   SpO2 97%   BMI 29.49 kg/m   PROVIDERS: Lorene Dy, MD is PCP    LABS: Labs reviewed: Acceptable for surgery. (all labs ordered are listed, but only abnormal results are displayed)  Labs Reviewed  SURGICAL PCR SCREEN  SARS CORONAVIRUS 2 (TAT 6-24 HRS)  CBC  BASIC METABOLIC PANEL  TYPE AND SCREEN    IMAGES: CT T-spine 09/15/20: IMPRESSION: 1. Severe erosive change at the T9-10 level with lucency  surrounding the transpedicular screws, worsened since 04/08/19. 2. Unchanged moderate spinal canal stenosis at the T9-10 level. - Aortic Atherosclerosis (ICD10-I70.0).    EKG: 10/23/20: Normal sinus rhythm with sinus arrhythmia Left axis deviation Right bundle branch block Abnormal ECG No significant change since 2019 Confirmed by Jenkins Rouge [phone]) on 10/25/2020 11:19:53 AM   CV: N/A  Past Medical History:  Diagnosis Date   Acalculous cholecystitis 08/24/2018   Arthritis    DVT of lower extremity (deep venous thrombosis) (HCC)    right leg - after back surgery in 2019   GERD (gastroesophageal reflux disease)    History of blood transfusion    Hypothyroidism    PONV (postoperative nausea and vomiting)    Ptosis of both eyelids    Vertigo    Wears glasses    reading    Past Surgical History:  Procedure Laterality Date   ABDOMINAL HYSTERECTOMY  [id number]   APPLICATION OF ROBOTIC ASSISTANCE FOR SPINAL PROCEDURE N/A 07/17/2017   Procedure: APPLICATION OF ROBOTIC ASSISTANCE FOR SPINAL PROCEDURE;  Surgeon: Kristeen Miss, MD;  Location: [place];  Service: Neurosurgery;  Laterality: N/A;   APPLICATION OF ROBOTIC ASSISTANCE FOR SPINAL PROCEDURE N/A 09/01/2017   Procedure: APPLICATION OF ROBOTIC ASSISTANCE FOR SPINAL PROCEDURE;  Surgeon: Kristeen Miss, MD;  Location: [place];  Service: Neurosurgery;  Laterality: N/A;   BACK SURGERY  1986   lumb lam   CARPAL TUNNEL RELEASE  2000   right   CARPAL TUNNEL RELEASE  2012   left   CHOLECYSTECTOMY N/A 08/24/2018   Procedure:  LAPAROSCOPIC CHOLECYSTECTOMY WITH INTRAOPERATIVE CHOLANGIOGRAM;  Surgeon: Fanny Skates, MD;  Location: [place];  Service: General;  Laterality: N/A;   COLONOSCOPY     CYSTOCELE REPAIR  2007   sling   DISTAL INTERPHALANGEAL JOINT FUSION Right 02/04/2014   Procedure: FUSION DISTAL INTERPHALANGEAL JOINT RIGHT INDEX FINGER ;  Surgeon: Daryll Brod, MD;  Location: [place];  Service: Orthopedics;  Laterality:  Right;   EYE SURGERY Bilateral    Cataract surgery with lens implant   KNEE SURGERY  2009,2011   partial knee   LAPAROSCOPY N/A 08/24/2018   Procedure: LAPAROSCOPY DIAGNOSTIC;  Surgeon: Fanny Skates, MD;  Location: [place];  Service: General;  Laterality: N/A;   OPEN REDUCTION INTERNAL FIXATION (ORIF) PROXIMAL PHALANX Left 08/30/2013   Procedure: OPEN REDUCTION INTERNAL FIXATION (ORIF) PROXIMAL PHALANX FRACTURE LEFT SMALL FINGER; SPLINT RING FINGER;  Surgeon: Wynonia Sours, MD;  Location: [place];  Service: Orthopedics;  Laterality: Left;   POSTERIOR LUMBAR FUSION 4 LEVEL N/A 07/17/2017   Procedure: Thoracic Ten - Lumbar Five revison of hardware with Mazor;  Surgeon: Kristeen Miss, MD;  Location: [place];  Service: Neurosurgery;  Laterality: N/A;  Thoracic/Lumbar   PTOSIS REPAIR Bilateral 07/27/2015   Procedure: PTOSIS REPAIR;  Surgeon: Cristine Polio, MD;  Location: [place];  Service: Plastics;  Laterality: Bilateral;   SHOULDER SURGERY     rt rcr,and lt   TONSILLECTOMY     TOTAL KNEE ARTHROPLASTY Right 12/23/2019   Procedure: TOTAL KNEE ARTHROPLASTY;  Surgeon: Vickey Huger, MD;  Location: [facility];  Service: Orthopedics;  Laterality: Right;    MEDICATIONS:  aspirin EC 325 MG tablet   b complex vitamins capsule   BLACK PEPPER-TURMERIC PO   diphenhydramine-acetaminophen (TYLENOL PM) 25-500 MG TABS tablet   ferrous sulfate 325 (65 FE) MG tablet   levothyroxine (SYNTHROID, LEVOTHROID) 50 MCG tablet   Multiple Vitamin (MULTIVITAMIN WITH MINERALS) TABS tablet   omeprazole (PRILOSEC) 20 MG capsule   OVER THE COUNTER MEDICATION   oxyCODONE (OXY IR/ROXICODONE) 5 MG immediate release tablet   Polyethyl Glycol-Propyl Glycol (SYSTANE OP)   Sodium Hyaluronate, oral, (HYALURONIC ACID PO)   tiZANidine (ZANAFLEX) 2 MG tablet   vitamin B-12 (CYANOCOBALAMIN) 500 MCG tablet   Vitamins-Lipotropics (B-50 PO)   zinc gluconate 50 MG tablet   No current  facility-administered medications for this encounter.  See PAT documentation for ASA instructions.   Myra Gianotti, PA-C Surgical Short Stay/Anesthesiology [facility] Phone [phone] [facility] Phone [phone] 10/26/2020 10:37 AM

## 2020-10-26 NOTE — Anesthesia Preprocedure Evaluation (Addendum)
Anesthesia Evaluation  Patient identified by MRN, date of birth, ID band Patient awake    Reviewed: Allergy & Precautions, NPO status , Patient's Chart, lab work & pertinent test results  History of Anesthesia Complications (+) PONV and history of anesthetic complications  Airway Mallampati: II  TM Distance: >3 FB Neck ROM: Full    Dental  (+) Partial Upper, Partial Lower   Pulmonary former smoker,    Pulmonary exam normal        Cardiovascular hypertension (no meds), + DVT  Normal cardiovascular exam     Neuro/Psych  Vertigo B/l ptosis  negative psych ROS   GI/Hepatic Neg liver ROS, GERD  Medicated and Controlled,  Endo/Other  Hypothyroidism   Renal/GU negative Renal ROS     Musculoskeletal  (+) Arthritis ,   Abdominal   Peds  Hematology negative hematology ROS (+)   Anesthesia Other Findings Covid test negative   Reproductive/Obstetrics                           Anesthesia Physical Anesthesia Plan  ASA: 2  Anesthesia Plan: General   Post-op Pain Management:    Induction: Intravenous  PONV Risk Score and Plan: 4 or greater and Treatment may vary due to age or medical condition, Ondansetron, Aprepitant and Propofol infusion  Airway Management Planned: Oral ETT  Additional Equipment: None  Intra-op Plan:   Post-operative Plan: Extubation in OR  Informed Consent: I have reviewed the patients History and Physical, chart, labs and discussed the procedure including the risks, benefits and alternatives for the proposed anesthesia with the patient or authorized representative who has indicated his/her understanding and acceptance.     Dental advisory given  Plan Discussed with: CRNA and Anesthesiologist  Anesthesia Plan Comments:       Anesthesia Quick Evaluation

## 2020-10-27 ENCOUNTER — Inpatient Hospital Stay (HOSPITAL_COMMUNITY): Payer: Medicare Other | Admitting: Anesthesiology

## 2020-10-27 ENCOUNTER — Inpatient Hospital Stay (HOSPITAL_COMMUNITY): Payer: Medicare Other

## 2020-10-27 ENCOUNTER — Inpatient Hospital Stay (HOSPITAL_COMMUNITY): Payer: Medicare Other | Admitting: Vascular Surgery

## 2020-10-27 ENCOUNTER — Encounter (HOSPITAL_COMMUNITY): Payer: Self-pay | Admitting: Neurological Surgery

## 2020-10-27 ENCOUNTER — Other Ambulatory Visit: Payer: Self-pay

## 2020-10-27 ENCOUNTER — Inpatient Hospital Stay (HOSPITAL_COMMUNITY)
Admission: RE | Admit: 2020-10-27 | Discharge: 2020-10-29 | DRG: 457 | Disposition: A | Discharge status: Home Health | Payer: Medicare Other | Attending: Neurological Surgery | Admitting: Neurological Surgery

## 2020-10-27 ENCOUNTER — Encounter (HOSPITAL_COMMUNITY)
Admission: RE | Disposition: A | Discharge status: Home Health | Payer: Self-pay | Source: Home / Self Care | Attending: Neurological Surgery

## 2020-10-27 DIAGNOSIS — E039 Hypothyroidism, unspecified: Secondary | ICD-10-CM | POA: Diagnosis present

## 2020-10-27 DIAGNOSIS — G992 Myelopathy in diseases classified elsewhere: Secondary | ICD-10-CM | POA: Diagnosis present

## 2020-10-27 DIAGNOSIS — T84018A Broken internal joint prosthesis, other site, initial encounter: Secondary | ICD-10-CM | POA: Diagnosis present

## 2020-10-27 DIAGNOSIS — G959 Disease of spinal cord, unspecified: Secondary | ICD-10-CM | POA: Diagnosis present

## 2020-10-27 DIAGNOSIS — Z87891 Personal history of nicotine dependence: Secondary | ICD-10-CM

## 2020-10-27 DIAGNOSIS — M963 Postlaminectomy kyphosis: Secondary | ICD-10-CM | POA: Diagnosis present

## 2020-10-27 DIAGNOSIS — K219 Gastro-esophageal reflux disease without esophagitis: Secondary | ICD-10-CM | POA: Diagnosis present

## 2020-10-27 DIAGNOSIS — Z981 Arthrodesis status: Secondary | ICD-10-CM

## 2020-10-27 DIAGNOSIS — M810 Age-related osteoporosis without current pathological fracture: Secondary | ICD-10-CM | POA: Diagnosis present

## 2020-10-27 DIAGNOSIS — Y831 Surgical operation with implant of artificial internal device as the cause of abnormal reaction of the patient, or of later complication, without mention of misadventure at the time of the procedure: Secondary | ICD-10-CM | POA: Diagnosis present

## 2020-10-27 DIAGNOSIS — Z96651 Presence of right artificial knee joint: Secondary | ICD-10-CM | POA: Diagnosis present

## 2020-10-27 DIAGNOSIS — Z79899 Other long term (current) drug therapy: Secondary | ICD-10-CM | POA: Diagnosis not present

## 2020-10-27 DIAGNOSIS — Z881 Allergy status to other antibiotic agents status: Secondary | ICD-10-CM

## 2020-10-27 DIAGNOSIS — Z79890 Hormone replacement therapy (postmenopausal): Secondary | ICD-10-CM

## 2020-10-27 DIAGNOSIS — Z86718 Personal history of other venous thrombosis and embolism: Secondary | ICD-10-CM | POA: Diagnosis not present

## 2020-10-27 DIAGNOSIS — M4804 Spinal stenosis, thoracic region: Secondary | ICD-10-CM | POA: Diagnosis present

## 2020-10-27 DIAGNOSIS — Z419 Encounter for procedure for purposes other than remedying health state, unspecified: Secondary | ICD-10-CM

## 2020-10-27 HISTORY — PX: APPLICATION OF ROBOTIC ASSISTANCE FOR SPINAL PROCEDURE: SHX6753

## 2020-10-27 HISTORY — PX: LUMBAR PERCUTANEOUS PEDICLE SCREW 4 LEVEL: SHX6318

## 2020-10-27 SURGERY — LUMBAR PERCUTANEOUS PEDICLE SCREW 4 LEVEL
Anesthesia: General

## 2020-10-27 MED ORDER — ONDANSETRON HCL 4 MG/2ML IJ SOLN
INTRAMUSCULAR | Status: AC
  Filled 2020-10-27: qty 2

## 2020-10-27 MED ORDER — ACETAMINOPHEN 10 MG/ML IV SOLN
INTRAVENOUS | Status: AC
  Filled 2020-10-27: qty 100

## 2020-10-27 MED ORDER — ONDANSETRON HCL 4 MG PO TABS: 4.0000 mg | ORAL_TABLET | Freq: Four times a day (QID) | ORAL | Status: DC | PRN

## 2020-10-27 MED ORDER — EPHEDRINE SULFATE-NACL 50-0.9 MG/10ML-% IV SOSY
PREFILLED_SYRINGE | INTRAVENOUS | Status: DC | PRN
  Administered 2020-10-27: 10 mg via INTRAVENOUS
  Administered 2020-10-27 (×2): 5 mg via INTRAVENOUS

## 2020-10-27 MED ORDER — CHLORHEXIDINE GLUCONATE CLOTH 2 % EX PADS: 6.0000 | MEDICATED_PAD | Freq: Once | CUTANEOUS | Status: DC

## 2020-10-27 MED ORDER — LEVOTHYROXINE SODIUM 50 MCG PO TABS
50.0000 ug | ORAL_TABLET | Freq: Every day | ORAL | Status: DC
  Administered 2020-10-28 – 2020-10-29 (×3): 50 ug via ORAL
  Filled 2020-10-27: qty 1
  Filled 2020-10-27 (×2): qty 2
  Filled 2020-10-27: qty 1

## 2020-10-27 MED ORDER — SODIUM CHLORIDE 0.9% FLUSH: 3.0000 mL | Freq: Two times a day (BID) | INTRAVENOUS | Status: DC

## 2020-10-27 MED ORDER — THROMBIN 5000 UNITS EX SOLR
CUTANEOUS | Status: AC
  Filled 2020-10-27: qty 5000

## 2020-10-27 MED ORDER — ONDANSETRON HCL 4 MG/2ML IJ SOLN: 4.0000 mg | Freq: Once | INTRAMUSCULAR | Status: DC | PRN

## 2020-10-27 MED ORDER — ROCURONIUM BROMIDE 10 MG/ML (PF) SYRINGE
PREFILLED_SYRINGE | INTRAVENOUS | Status: DC | PRN
  Administered 2020-10-27: 20 mg via INTRAVENOUS
  Administered 2020-10-27: 30 mg via INTRAVENOUS
  Administered 2020-10-27: 60 mg via INTRAVENOUS
  Administered 2020-10-27: 40 mg via INTRAVENOUS

## 2020-10-27 MED ORDER — ROCURONIUM BROMIDE 10 MG/ML (PF) SYRINGE
PREFILLED_SYRINGE | INTRAVENOUS | Status: AC
  Filled 2020-10-27: qty 10

## 2020-10-27 MED ORDER — ACETAMINOPHEN 500 MG PO TABS
1000.0000 mg | ORAL_TABLET | Freq: Once | ORAL | Status: AC
  Administered 2020-10-27: 1000 mg via ORAL
  Filled 2020-10-27: qty 2

## 2020-10-27 MED ORDER — BUPIVACAINE HCL (PF) 0.5 % IJ SOLN
INTRAMUSCULAR | Status: DC | PRN
  Administered 2020-10-27: 5 mL
  Administered 2020-10-27: 21 mL
  Administered 2020-10-27: 4 mL

## 2020-10-27 MED ORDER — FLEET ENEMA 7-19 GM/118ML RE ENEM
1.0000 | ENEMA | Freq: Once | RECTAL | Status: DC | PRN
Start: 2020-10-27 — End: 2020-10-29

## 2020-10-27 MED ORDER — KETOROLAC TROMETHAMINE 0.5 % OP SOLN
1.0000 [drp] | Freq: Four times a day (QID) | OPHTHALMIC | Status: DC | PRN
  Filled 2020-10-27: qty 5

## 2020-10-27 MED ORDER — PROPOFOL 10 MG/ML IV BOLUS
INTRAVENOUS | Status: DC | PRN
  Administered 2020-10-27: 110 mg via INTRAVENOUS

## 2020-10-27 MED ORDER — ALUM & MAG HYDROXIDE-SIMETH 200-200-20 MG/5ML PO SUSP
30.0000 mL | Freq: Four times a day (QID) | ORAL | Status: DC | PRN
  Administered 2020-10-27: 30 mL via ORAL
  Filled 2020-10-27: qty 30

## 2020-10-27 MED ORDER — ORAL CARE MOUTH RINSE: 15.0000 mL | Freq: Once | OROMUCOSAL | Status: AC

## 2020-10-27 MED ORDER — METHOCARBAMOL 500 MG PO TABS
500.0000 mg | ORAL_TABLET | Freq: Four times a day (QID) | ORAL | Status: DC | PRN
  Administered 2020-10-27 – 2020-10-29 (×5): 500 mg via ORAL
  Filled 2020-10-27 (×5): qty 1

## 2020-10-27 MED ORDER — DOCUSATE SODIUM 100 MG PO CAPS
100.0000 mg | ORAL_CAPSULE | Freq: Two times a day (BID) | ORAL | Status: DC
  Administered 2020-10-27 – 2020-10-29 (×4): 100 mg via ORAL
  Filled 2020-10-27 (×4): qty 1

## 2020-10-27 MED ORDER — LIDOCAINE-EPINEPHRINE 1 %-1:100000 IJ SOLN
INTRAMUSCULAR | Status: AC
  Filled 2020-10-27: qty 1

## 2020-10-27 MED ORDER — LACTATED RINGERS IV SOLN: INTRAVENOUS | Status: DC

## 2020-10-27 MED ORDER — DEXAMETHASONE SODIUM PHOSPHATE 10 MG/ML IJ SOLN
INTRAMUSCULAR | Status: DC | PRN
  Administered 2020-10-27 (×2): 5 mg via INTRAVENOUS

## 2020-10-27 MED ORDER — ACETAMINOPHEN 10 MG/ML IV SOLN
INTRAVENOUS | Status: DC | PRN
  Administered 2020-10-27: 1000 mg via INTRAVENOUS

## 2020-10-27 MED ORDER — MENTHOL 3 MG MT LOZG
1.0000 | LOZENGE | OROMUCOSAL | Status: DC | PRN
  Filled 2020-10-27: qty 9

## 2020-10-27 MED ORDER — FENTANYL CITRATE (PF) 250 MCG/5ML IJ SOLN
INTRAMUSCULAR | Status: AC
  Filled 2020-10-27: qty 5

## 2020-10-27 MED ORDER — ONDANSETRON HCL 4 MG/2ML IJ SOLN
4.0000 mg | Freq: Four times a day (QID) | INTRAMUSCULAR | Status: DC | PRN
  Administered 2020-10-28: 4 mg via INTRAVENOUS
  Filled 2020-10-27: qty 2

## 2020-10-27 MED ORDER — SODIUM CHLORIDE 0.9 % IV SOLN
250.0000 mL | INTRAVENOUS | Status: DC
  Administered 2020-10-27: 250 mL via INTRAVENOUS

## 2020-10-27 MED ORDER — CHLORHEXIDINE GLUCONATE 0.12 % MT SOLN
15.0000 mL | Freq: Once | OROMUCOSAL | Status: AC
  Administered 2020-10-27: 15 mL via OROMUCOSAL
  Filled 2020-10-27: qty 15

## 2020-10-27 MED ORDER — THROMBIN 20000 UNITS EX SOLR
OROMUCOSAL | Status: DC | PRN
  Administered 2020-10-27: 20 mL via TOPICAL

## 2020-10-27 MED ORDER — BACITRACIN ZINC 500 UNIT/GM EX OINT
TOPICAL_OINTMENT | CUTANEOUS | Status: AC
  Filled 2020-10-27: qty 28.35

## 2020-10-27 MED ORDER — FENTANYL CITRATE (PF) 100 MCG/2ML IJ SOLN
INTRAMUSCULAR | Status: DC | PRN
  Administered 2020-10-27 (×2): 50 ug via INTRAVENOUS
  Administered 2020-10-27: 100 ug via INTRAVENOUS
  Administered 2020-10-27: 50 ug via INTRAVENOUS

## 2020-10-27 MED ORDER — BSS IO SOLN: 15.0000 mL | Freq: Once | INTRAOCULAR | Status: DC

## 2020-10-27 MED ORDER — PROPOFOL 10 MG/ML IV BOLUS
INTRAVENOUS | Status: AC
  Filled 2020-10-27: qty 40

## 2020-10-27 MED ORDER — BUPIVACAINE HCL (PF) 0.5 % IJ SOLN
INTRAMUSCULAR | Status: AC
  Filled 2020-10-27: qty 30

## 2020-10-27 MED ORDER — OXYCODONE-ACETAMINOPHEN 5-325 MG PO TABS
1.0000 | ORAL_TABLET | ORAL | Status: DC | PRN
  Administered 2020-10-27 – 2020-10-28 (×4): 2 via ORAL
  Filled 2020-10-27 (×4): qty 2

## 2020-10-27 MED ORDER — PROPOFOL 500 MG/50ML IV EMUL
INTRAVENOUS | Status: DC | PRN
  Administered 2020-10-27: 30 ug/kg/min via INTRAVENOUS

## 2020-10-27 MED ORDER — THROMBIN 5000 UNITS EX SOLR
OROMUCOSAL | Status: DC | PRN
  Administered 2020-10-27: 5 mL via TOPICAL

## 2020-10-27 MED ORDER — PHENYLEPHRINE 40 MCG/ML (10ML) SYRINGE FOR IV PUSH (FOR BLOOD PRESSURE SUPPORT)
PREFILLED_SYRINGE | INTRAVENOUS | Status: DC | PRN
  Administered 2020-10-27 (×2): 80 ug via INTRAVENOUS

## 2020-10-27 MED ORDER — LIDOCAINE 2% (20 MG/ML) 5 ML SYRINGE
INTRAMUSCULAR | Status: AC
  Filled 2020-10-27: qty 5

## 2020-10-27 MED ORDER — EPHEDRINE 5 MG/ML INJ
INTRAVENOUS | Status: AC
  Filled 2020-10-27: qty 5

## 2020-10-27 MED ORDER — BISACODYL 10 MG RE SUPP: 10.0000 mg | Freq: Every day | RECTAL | Status: DC | PRN

## 2020-10-27 MED ORDER — METHOCARBAMOL 1000 MG/10ML IJ SOLN
500.0000 mg | Freq: Four times a day (QID) | INTRAVENOUS | Status: DC | PRN
  Filled 2020-10-27: qty 5

## 2020-10-27 MED ORDER — VANCOMYCIN HCL IN DEXTROSE 1-5 GM/200ML-% IV SOLN
1000.0000 mg | Freq: Once | INTRAVENOUS | Status: AC
  Administered 2020-10-27: 1000 mg via INTRAVENOUS
  Filled 2020-10-27: qty 200

## 2020-10-27 MED ORDER — SODIUM CHLORIDE 0.9% FLUSH: 3.0000 mL | INTRAVENOUS | Status: DC | PRN

## 2020-10-27 MED ORDER — PHENOL 1.4 % MT LIQD: 1.0000 | OROMUCOSAL | Status: DC | PRN

## 2020-10-27 MED ORDER — PHENYLEPHRINE HCL-NACL 10-0.9 MG/250ML-% IV SOLN
INTRAVENOUS | Status: DC | PRN
  Administered 2020-10-27: 40 ug/min via INTRAVENOUS

## 2020-10-27 MED ORDER — POLYMYXIN B-TRIMETHOPRIM 10000-0.1 UNIT/ML-% OP SOLN: 1.0000 [drp] | Freq: Three times a day (TID) | OPHTHALMIC | Status: DC

## 2020-10-27 MED ORDER — FENTANYL CITRATE (PF) 100 MCG/2ML IJ SOLN
25.0000 ug | INTRAMUSCULAR | Status: DC | PRN
  Administered 2020-10-27 (×2): 50 ug via INTRAVENOUS

## 2020-10-27 MED ORDER — VANCOMYCIN HCL IN DEXTROSE 1-5 GM/200ML-% IV SOLN
1000.0000 mg | INTRAVENOUS | Status: AC
  Administered 2020-10-27: 1000 mg via INTRAVENOUS
  Filled 2020-10-27: qty 200

## 2020-10-27 MED ORDER — ALBUMIN HUMAN 5 % IV SOLN: INTRAVENOUS | Status: DC | PRN

## 2020-10-27 MED ORDER — APREPITANT 40 MG PO CAPS
40.0000 mg | ORAL_CAPSULE | Freq: Once | ORAL | Status: AC
  Administered 2020-10-27: 40 mg via ORAL
  Filled 2020-10-27: qty 1

## 2020-10-27 MED ORDER — POLYVINYL ALCOHOL 1.4 % OP SOLN
Freq: Two times a day (BID) | OPHTHALMIC | Status: DC | PRN
  Filled 2020-10-27: qty 15

## 2020-10-27 MED ORDER — ACETAMINOPHEN 325 MG PO TABS: 650.0000 mg | ORAL_TABLET | ORAL | Status: DC | PRN

## 2020-10-27 MED ORDER — OXYCODONE HCL 5 MG PO TABS: 5.0000 mg | ORAL_TABLET | Freq: Once | ORAL | Status: DC | PRN

## 2020-10-27 MED ORDER — FENTANYL CITRATE (PF) 100 MCG/2ML IJ SOLN
INTRAMUSCULAR | Status: AC
  Filled 2020-10-27: qty 2

## 2020-10-27 MED ORDER — THROMBIN 20000 UNITS EX SOLR
CUTANEOUS | Status: AC
  Filled 2020-10-27: qty 20000

## 2020-10-27 MED ORDER — DEXAMETHASONE SODIUM PHOSPHATE 10 MG/ML IJ SOLN
INTRAMUSCULAR | Status: AC
  Filled 2020-10-27: qty 1

## 2020-10-27 MED ORDER — MORPHINE SULFATE (PF) 2 MG/ML IV SOLN
2.0000 mg | INTRAVENOUS | Status: DC | PRN
  Administered 2020-10-28: 2 mg via INTRAVENOUS
  Filled 2020-10-27: qty 2

## 2020-10-27 MED ORDER — LIDOCAINE-EPINEPHRINE 1 %-1:100000 IJ SOLN
INTRAMUSCULAR | Status: DC | PRN
  Administered 2020-10-27: 5 mL

## 2020-10-27 MED ORDER — PHENYLEPHRINE 40 MCG/ML (10ML) SYRINGE FOR IV PUSH (FOR BLOOD PRESSURE SUPPORT)
PREFILLED_SYRINGE | INTRAVENOUS | Status: AC
  Filled 2020-10-27: qty 10

## 2020-10-27 MED ORDER — OXYCODONE HCL 5 MG/5ML PO SOLN
5.0000 mg | Freq: Once | ORAL | Status: DC | PRN
Start: 2020-10-27 — End: 2020-10-27

## 2020-10-27 MED ORDER — LIDOCAINE 2% (20 MG/ML) 5 ML SYRINGE
INTRAMUSCULAR | Status: DC | PRN
  Administered 2020-10-27: 60 mg via INTRAVENOUS

## 2020-10-27 MED ORDER — BUPIVACAINE LIPOSOME 1.3 % IJ SUSP
INTRAMUSCULAR | Status: AC
  Filled 2020-10-27: qty 20

## 2020-10-27 MED ORDER — ONDANSETRON HCL 4 MG/2ML IJ SOLN
INTRAMUSCULAR | Status: DC | PRN
  Administered 2020-10-27: 4 mg via INTRAVENOUS

## 2020-10-27 MED ORDER — EPHEDRINE SULFATE 50 MG/ML IJ SOLN: INTRAMUSCULAR | Status: DC | PRN

## 2020-10-27 MED ORDER — PANTOPRAZOLE SODIUM 40 MG PO TBEC
80.0000 mg | DELAYED_RELEASE_TABLET | Freq: Every day | ORAL | Status: DC
  Administered 2020-10-28 – 2020-10-29 (×2): 80 mg via ORAL
  Filled 2020-10-27 (×2): qty 2

## 2020-10-27 MED ORDER — SENNA 8.6 MG PO TABS
1.0000 | ORAL_TABLET | Freq: Two times a day (BID) | ORAL | Status: DC
  Administered 2020-10-27 – 2020-10-28 (×3): 8.6 mg via ORAL
  Filled 2020-10-27 (×3): qty 1

## 2020-10-27 MED ORDER — ACETAMINOPHEN 650 MG RE SUPP: 650.0000 mg | RECTAL | Status: DC | PRN

## 2020-10-27 MED ORDER — 0.9 % SODIUM CHLORIDE (POUR BTL) OPTIME
TOPICAL | Status: DC | PRN
  Administered 2020-10-27: 2000 mL

## 2020-10-27 MED ORDER — POLYETHYLENE GLYCOL 3350 17 G PO PACK: 17.0000 g | PACK | Freq: Every day | ORAL | Status: DC | PRN

## 2020-10-27 SURGICAL SUPPLY — 82 items
BAG COUNTER SPONGE SURGICOUNT (BAG) ×4 IMPLANT
BIT DRILL LONG 3.0X30 (BIT) IMPLANT
BIT DRILL LONG 3X80 (BIT) IMPLANT
BIT DRILL LONG 4X80 (BIT) IMPLANT
BIT DRILL SHORT 3.0X30 (BIT) IMPLANT
BIT DRILL SHORT 3X80 (BIT) IMPLANT
BLADE BN FN 3.2XSTRL LF (MISCELLANEOUS) ×1 IMPLANT
BLADE BONE MILL FINE (MISCELLANEOUS) ×2
BLADE SURG 11 STRL SS (BLADE) IMPLANT
BONE CANC CHIPS 20CC PCAN1/4 (Bone Implant) ×2 IMPLANT
CEMENT BONE KYPHX HV R (Orthopedic Implant) ×2 IMPLANT
CHIPS CANC BONE 20CC PCAN1/4 (Bone Implant) ×1 IMPLANT
CNTNR URN SCR LID CUP LEK RST (MISCELLANEOUS) ×1 IMPLANT
CONT SPEC 4OZ STRL OR WHT (MISCELLANEOUS) ×2
COVER BACK TABLE 60X90IN (DRAPES) ×2 IMPLANT
DERMABOND ADVANCED (GAUZE/BANDAGES/DRESSINGS) ×1
DERMABOND ADVANCED .7 DNX12 (GAUZE/BANDAGES/DRESSINGS) ×1 IMPLANT
DEVICE DISSECT PLASMABLAD 3.0S (MISCELLANEOUS) ×1 IMPLANT
DRAPE C-ARM 42X72 X-RAY (DRAPES) ×4 IMPLANT
DRAPE C-ARMOR (DRAPES) ×2 IMPLANT
DRAPE LAPAROTOMY 100X72X124 (DRAPES) ×2 IMPLANT
DRAPE SHEET LG 3/4 BI-LAMINATE (DRAPES) ×2 IMPLANT
DRAPE WARM FLUID 44X44 (DRAPES) ×2 IMPLANT
DRSG OPSITE POSTOP 4X10 (GAUZE/BANDAGES/DRESSINGS) ×2 IMPLANT
DURAPREP 26ML APPLICATOR (WOUND CARE) ×2 IMPLANT
ELECT BLADE 4.0 EZ CLEAN MEGAD (MISCELLANEOUS)
ELECT REM PT RETURN 9FT ADLT (ELECTROSURGICAL) ×2
ELECTRODE BLDE 4.0 EZ CLN MEGD (MISCELLANEOUS) IMPLANT
ELECTRODE REM PT RTRN 9FT ADLT (ELECTROSURGICAL) ×1 IMPLANT
GAUZE 4X4 16PLY ~~LOC~~+RFID DBL (SPONGE) ×4 IMPLANT
GAUZE SPONGE 4X4 12PLY STRL (GAUZE/BANDAGES/DRESSINGS) ×2 IMPLANT
GLOVE EXAM NITRILE XL STR (GLOVE) IMPLANT
GLOVE SURG LTX SZ7 (GLOVE) ×2 IMPLANT
GLOVE SURG LTX SZ8.5 (GLOVE) ×4 IMPLANT
GLOVE SURG UNDER POLY LF SZ7 (GLOVE) ×6 IMPLANT
GLOVE SURG UNDER POLY LF SZ7.5 (GLOVE) ×8 IMPLANT
GLOVE SURG UNDER POLY LF SZ8.5 (GLOVE) ×4 IMPLANT
GOWN STRL REUS W/ TWL LRG LVL3 (GOWN DISPOSABLE) ×3 IMPLANT
GOWN STRL REUS W/ TWL XL LVL3 (GOWN DISPOSABLE) ×3 IMPLANT
GOWN STRL REUS W/TWL 2XL LVL3 (GOWN DISPOSABLE) ×4 IMPLANT
GOWN STRL REUS W/TWL LRG LVL3 (GOWN DISPOSABLE) ×6
GOWN STRL REUS W/TWL XL LVL3 (GOWN DISPOSABLE) ×6
GRAFT BN 10X1XDBM MAGNIFUSE (Bone Implant) ×1 IMPLANT
GRAFT BONE MAGNIFUSE 1X10CM (Bone Implant) ×2 IMPLANT
GRAFT BONE PROTEIOS LRG 5CC (Orthopedic Implant) ×2 IMPLANT
GUIDEWIRE NITINOL BEVEL TIP (WIRE) ×16 IMPLANT
HEMOSTAT POWDER SURGIFOAM 1G (HEMOSTASIS) ×2 IMPLANT
KIT BASIN OR (CUSTOM PROCEDURE TRAY) ×2 IMPLANT
KIT SPINE MAZOR X ROBO DISP (MISCELLANEOUS) ×2 IMPLANT
MARKER SKIN DUAL TIP RULER LAB (MISCELLANEOUS) ×2 IMPLANT
MIXER KYPHON (MISCELLANEOUS) ×2 IMPLANT
NEEDLE HYPO 25X1 1.5 SAFETY (NEEDLE) ×2 IMPLANT
NS IRRIG 1000ML POUR BTL (IV SOLUTION) ×4 IMPLANT
PACK LAMINECTOMY NEURO (CUSTOM PROCEDURE TRAY) ×2 IMPLANT
PAD ARMBOARD 7.5X6 YLW CONV (MISCELLANEOUS) ×2 IMPLANT
PIN HEAD 2.5X60MM (PIN) IMPLANT
PLASMABLADE 3.0S (MISCELLANEOUS) ×2
PUSHER RELINE FENS MAS (MISCELLANEOUS) ×12 IMPLANT
RELINE MAS NEEDLE FENS (NEEDLE) ×12 IMPLANT
ROD RELINE-O 5.5X300 STRT NS (Rod) ×2 IMPLANT
ROD RELINE-O 5.5X300MM STRT (Rod) ×4 IMPLANT
SCREW LOCK RELINE 5.5 TULIP (Screw) ×16 IMPLANT
SCREW PA RELINE 4.5X45 (Screw) ×4 IMPLANT
SCREW RELINE PA 2FC 5.5X45 (Screw) ×8 IMPLANT
SCREW RELINE PA 2FC 5.5X50 (Screw) ×4 IMPLANT
SCREW SCHANZ SA 4.0MM (MISCELLANEOUS) IMPLANT
SPONGE SURGIFOAM ABS GEL 100 (HEMOSTASIS) ×2 IMPLANT
SPONGE SURGIFOAM ABS GEL SZ50 (HEMOSTASIS) IMPLANT
SPONGE T-LAP 4X18 ~~LOC~~+RFID (SPONGE) ×4 IMPLANT
SUT VIC AB 1 CT1 18XBRD ANBCTR (SUTURE) ×2 IMPLANT
SUT VIC AB 1 CT1 8-18 (SUTURE) ×4
SUT VIC AB 2-0 CT1 18 (SUTURE) ×2 IMPLANT
SUT VIC AB 3-0 SH 8-18 (SUTURE) ×4 IMPLANT
SUT VIC AB 4-0 RB1 18 (SUTURE) ×4 IMPLANT
SYR CONTROL 10ML LL (SYRINGE) ×4 IMPLANT
TEMPLATE ROD SILICON 250 (ORTHOPEDIC DISPOSABLE SUPPLIES) ×2 IMPLANT
TIP FENS REPLACE RELINE (MISCELLANEOUS) ×12 IMPLANT
TOWEL GREEN STERILE (TOWEL DISPOSABLE) ×2 IMPLANT
TOWEL GREEN STERILE FF (TOWEL DISPOSABLE) ×2 IMPLANT
TRAY FOLEY MTR SLVR 16FR STAT (SET/KITS/TRAYS/PACK) ×2 IMPLANT
TUBE MAZOR SA REDUCTION (TUBING) ×2 IMPLANT
WATER STERILE IRR 1000ML POUR (IV SOLUTION) ×2 IMPLANT

## 2020-10-27 NOTE — Anesthesia Postprocedure Evaluation (Signed)
Anesthesia Post Note  Patient: [name]  Procedure(s) Performed: Thoracic nine-ten Laminectomy with extension of fusion from Thoracic ten to Thoracic six with segmental fixation (cemented) with pedicle screw and mazor APPLICATION OF ROBOTIC ASSISTANCE FOR SPINAL PROCEDURE     Patient location during evaluation: PACU Anesthesia Type: General Level of consciousness: awake and alert Pain management: pain level controlled Vital Signs Assessment: post-procedure vital signs reviewed and stable Respiratory status: spontaneous breathing, nonlabored ventilation and respiratory function stable Cardiovascular status: blood pressure returned to baseline and stable Postop Assessment: no apparent nausea or vomiting Anesthetic complications: no   No notable events documented.  Last Vitals:  Vitals:   10/27/20 1430 10/27/20 1445  BP: 129/67 131/72  Pulse: 75 66  Resp: 15 11  Temp:    SpO2: 100% 100%                  Audry Pili

## 2020-10-27 NOTE — Anesthesia Procedure Notes (Addendum)
Procedure Name: Intubation Date/Time: 10/27/2020 8:00 AM Performed by: Audry Pili, MD Pre-anesthesia Checklist: Patient identified, Emergency Drugs available, Suction available and Patient being monitored Patient Re-evaluated:Patient Re-evaluated prior to induction Oxygen Delivery Method: Circle system utilized Preoxygenation: Pre-oxygenation with 100% oxygen Induction Type: IV induction Ventilation: Mask ventilation without difficulty Laryngoscope Size: Miller and 2 Grade View: Grade I Tube type: Oral Tube size: 7.0 mm Number of attempts: 1 Airway Equipment and Method: Stylet Placement Confirmation: ETT inserted through vocal cords under direct vision, positive ETCO2 and breath sounds checked- equal and bilateral Secured at: 22 cm Tube secured with: Tape Dental Injury: Teeth and Oropharynx as per pre-operative assessment  Comments: Intubation performed by Harrell Gave, Danelle Earthly SRNA

## 2020-10-27 NOTE — Progress Notes (Signed)
Pharmacy Antibiotic Note  [name] is a 87 y.o. female s/p spinal procedure.  Pharmacy has been consulted for vancomycin  -vancomycin '1000mg'$  IV given at ~ 6:15am today -no drain in place   Plan: -vancomycin '1000mg'$  IVx1  at 6:30pm  Will sign off. Please contact pharmacy with any other needs.  Thank you Hildred Laser, PharmD Clinical Pharmacist **Pharmacist phone directory can now be found on [email or web address] ([email or web address]).  Listed under [place].

## 2020-10-27 NOTE — H&P (Signed)
[name] is an 87 y.o. female.   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  She has developed degeneration across the thoracolumbar spine.  Her initial problem started back in 2019 when she had some lower lumbar and mid lumbar degenerative changes.  She underwent decompression and fusion from L2-L5 and subsequently had fracture of the L2 screws.  She then required stabilization across the thoracolumbar junction which was performed about a year later.  This remained stable but then in 2021 she had degenerative changes at L5-S1 and underwent fusion across the lumbosacral junction with fixation to the pelvis.  She had been stable until the last number of months when she notes increasing thoracic back pain and now weakness in her lower extremities she is walking with the use of a walker.  A follow-up study demonstrates that at the superior portion of her fixation at his T9-T10 she has developed a kyphosis with collapse of the T9 vertebrae and the T10 vertebrae has had dislocation of the superiormost screws.  The screws had been augmented.  Patient does have some modest osteoporosis that is being treated.  After careful consideration of her situation I advised that she would require decompression via laminectomy and extension of the fusion to the T6 vertebrae.  The screws were also be augmented.  She is now admitted for that procedure.  Past Medical History:  Diagnosis Date   Acalculous cholecystitis 08/24/2018   Arthritis    DVT of lower extremity (deep venous thrombosis) (HCC)    right leg - after back surgery in 2019   GERD (gastroesophageal reflux disease)    History of blood transfusion    Hypothyroidism    PONV (postoperative nausea and vomiting)    Ptosis of both eyelids    Vertigo    Wears glasses    reading    Past Surgical History:  Procedure Laterality Date    ABDOMINAL HYSTERECTOMY  [id number]   APPLICATION OF ROBOTIC ASSISTANCE FOR SPINAL PROCEDURE N/A 07/17/2017   Procedure: APPLICATION OF ROBOTIC ASSISTANCE FOR SPINAL PROCEDURE;  Surgeon: Kristeen Miss, MD;  Location: [place];  Service: Neurosurgery;  Laterality: N/A;   APPLICATION OF ROBOTIC ASSISTANCE FOR SPINAL PROCEDURE N/A 09/01/2017   Procedure: APPLICATION OF ROBOTIC ASSISTANCE FOR SPINAL PROCEDURE;  Surgeon: Kristeen Miss, MD;  Location: [place];  Service: Neurosurgery;  Laterality: N/A;   BACK SURGERY  1986   lumb lam   CARPAL TUNNEL RELEASE  2000   right   CARPAL TUNNEL RELEASE  2012   left   CHOLECYSTECTOMY N/A 08/24/2018   Procedure: LAPAROSCOPIC CHOLECYSTECTOMY WITH INTRAOPERATIVE CHOLANGIOGRAM;  Surgeon: Fanny Skates, MD;  Location: [facility];  Service: General;  Laterality: N/A;   COLONOSCOPY     CYSTOCELE REPAIR  2007   sling   DISTAL INTERPHALANGEAL JOINT FUSION Right 02/04/2014   Procedure: FUSION DISTAL INTERPHALANGEAL JOINT RIGHT INDEX FINGER ;  Surgeon: Daryll Brod, MD;  Location: [place];  Service: Orthopedics;  Laterality: Right;   EYE SURGERY Bilateral    Cataract surgery with lens implant   KNEE SURGERY  2009,2011   partial knee   LAPAROSCOPY N/A 08/24/2018   Procedure: LAPAROSCOPY DIAGNOSTIC;  Surgeon: Fanny Skates, MD;  Location: [place];  Service: General;  Laterality: N/A;   OPEN REDUCTION INTERNAL FIXATION (ORIF) PROXIMAL PHALANX Left 08/30/2013   Procedure: OPEN REDUCTION INTERNAL FIXATION (ORIF) PROXIMAL PHALANX FRACTURE LEFT SMALL  FINGER; SPLINT RING FINGER;  Surgeon: Wynonia Sours, MD;  Location: [place];  Service: Orthopedics;  Laterality: Left;   POSTERIOR LUMBAR FUSION 4 LEVEL N/A 07/17/2017   Procedure: Thoracic Ten - Lumbar Five revison of hardware with Mazor;  Surgeon: Kristeen Miss, MD;  Location: [place];  Service: Neurosurgery;  Laterality: N/A;  Thoracic/Lumbar   PTOSIS REPAIR Bilateral 07/27/2015   Procedure: PTOSIS REPAIR;   Surgeon: Cristine Polio, MD;  Location: [place];  Service: Plastics;  Laterality: Bilateral;   SHOULDER SURGERY     rt rcr,and lt   TONSILLECTOMY     TOTAL KNEE ARTHROPLASTY Right 12/23/2019   Procedure: TOTAL KNEE ARTHROPLASTY;  Surgeon: Vickey Huger, MD;  [facility];  Service: Orthopedics;  Laterality: Right;    Family History  Problem Relation Age of Onset   Diabetes Sister    Breast cancer Neg Hx    Social History:  reports that she has quit smoking. She has never used smokeless tobacco. She reports that she does not drink alcohol and does not use drugs.  Allergies:  Allergies  Allergen Reactions   Keflex [Cephalexin] Hives    Medications Prior to Admission  Medication Sig Dispense Refill   b complex vitamins capsule Take 1 capsule by mouth daily.     BLACK PEPPER-TURMERIC PO Take 1 capsule by mouth daily.     diphenhydramine-acetaminophen (TYLENOL PM) 25-500 MG TABS tablet Take 2 tablets by mouth at bedtime.     ferrous sulfate 325 (65 FE) MG tablet Take 325 mg by mouth daily with breakfast.     levothyroxine (SYNTHROID, LEVOTHROID) 50 MCG tablet Take 50 mcg by mouth daily before breakfast.      Multiple Vitamin (MULTIVITAMIN WITH MINERALS) TABS tablet Take 1 tablet by mouth at bedtime.     omeprazole (PRILOSEC) 20 MG capsule Take 20 mg by mouth in the morning.     OVER THE COUNTER MEDICATION Take 1 capsule by mouth daily. Lemon Flavonoid     Polyethyl Glycol-Propyl Glycol (SYSTANE OP) Place 1 drop into both eyes 2 (two) times daily as needed (dry eyes).     Sodium Hyaluronate, oral, (HYALURONIC ACID PO) Take 1 capsule by mouth daily. Injuv Form     vitamin B-12 (CYANOCOBALAMIN) 500 MCG tablet Take 500 mcg by mouth daily.     Vitamins-Lipotropics (B-50 PO) Take 1 tablet by mouth at bedtime.     zinc gluconate 50 MG tablet Take 50 mg by mouth daily.     aspirin EC 325 MG tablet Take 1 tablet (325 mg total) by mouth 2 (two) times daily. (Patient not  taking: No sig reported) 30 tablet 0   oxyCODONE (OXY IR/ROXICODONE) 5 MG immediate release tablet Take 1 tablet (5 mg total) by mouth every 4 (four) hours as needed for severe pain. (Patient not taking: No sig reported) 40 tablet 0   tiZANidine (ZANAFLEX) 2 MG tablet Take 1 tablet (2 mg total) by mouth every 6 (six) hours as needed for muscle spasms. (Patient not taking: No sig reported) 40 tablet 0    No results found for this or any previous visit (from the past 48 hour(s)). No results found.  Review of Systems  Constitutional:  Positive for activity change.  Eyes: Negative.   Respiratory: Negative.    Cardiovascular: Negative.   Gastrointestinal: Negative.   Endocrine: Negative.   Genitourinary: Negative.   Allergic/Immunologic: Negative.   Neurological:  Positive for weakness and numbness.  Hematological:  History of deep venous thrombosis  Psychiatric/Behavioral: Negative.     Blood pressure (!) 163/86, pulse 100, temperature 97.9 F (36.6 C), temperature source Oral, resp. rate 18, height '5\' 6"'$  (1.676 m), weight 82.9 kg, SpO2 96 %. Physical Exam Constitutional:      Appearance: Normal appearance.  HENT:     Head: Normocephalic and atraumatic.     Right Ear: Tympanic membrane normal.     Left Ear: Tympanic membrane normal.     Nose: Nose normal.     Mouth/Throat:     Mouth: Mucous membranes are moist.  Eyes:     Extraocular Movements: Extraocular movements intact.     Conjunctiva/sclera: Conjunctivae normal.     Pupils: Pupils are equal, round, and reactive to light.  Cardiovascular:     Rate and Rhythm: Normal rate and regular rhythm.     Pulses: Normal pulses.     Heart sounds: Normal heart sounds.  Pulmonary:     Effort: Pulmonary effort is normal.     Breath sounds: Normal breath sounds.  Abdominal:     General: Abdomen is flat.     Palpations: Abdomen is soft.  Musculoskeletal:     Cervical back: Normal range of motion and neck supple.     Comments:  Straight leg raising is positive at 15 degrees in either lower extremities.  Palpation percussion across the mid thoracic spine is painful.  Skin:    General: Skin is warm and dry.     Capillary Refill: Capillary refill takes less than 2 seconds.  Neurological:     Mental Status: She is alert.     Comments: Noticed weakness in the iliopsoas and the quadriceps bilaterally at 4 out of 5.  Mildly wide-based gait.  Decreased sensation in the anterior shins and dorsum of the feet bilaterally to pin and light touch.  Upper extremity strength and reflexes are normal cranial nerve examination is normal.  Psychiatric:        Mood and Affect: Mood normal.        Behavior: Behavior normal.        Thought Content: Thought content normal.        Judgment: Judgment normal.     Assessment/Plan Kyphosis secondary to fracture of T9 and T10 with loosening of hardware T10.  Revision of fusion to T6 with decompression of the T9-T10 via laminectomy.  Earleen Newport, MD 10/27/2020, 7:44 AM

## 2020-10-27 NOTE — Progress Notes (Signed)
Orthopedic Tech Progress Note Patient Details:  [name] [birth year] [id number]  RN stated patient has TLSO BRACE   Patient ID: [name], female   DOB: 03/21/1933, 87 y.o.   MRN: [id number]  Janit Pagan 10/27/2020, 5:39 PM

## 2020-10-27 NOTE — Op Note (Signed)
Date of surgery: 10/27/2020 Preoperative diagnosis: T9-T10 fracture with kyphosis and thoracic myelopathy.  History of fusion from T10 to the pelvis. Postoperative diagnosis: Same Procedure: Posterior decompression and stabilization of T9-T10 fracture with fixation from T6 to the lumbar spine.  Robotic assistance with pedicular screw placement, vertebral augmentation T6-T7-T8.  Posterior arthrodesis with autograft and allograft and Proteus.  T6-T12 Surgeon: Kristeen Miss First Assistant: Consuella Lose MD Anesthesia: General endotracheal Indications: Demisha Lahti is an 87 year old individual who several years ago had fusion across the thoracolumbar junction for this significant spondylitic change in her upper lumbar spine.  She has ultimately had a fusion from T10 to the pelvis.  She had developed increasing pain and then weakness in her lower extremities and was found to have developed a kyphotic compression fracture with dislodgment of the T10 screws into the T9-T10 disc space and significant stenosis at T9-T10.  There was an acute kyphotic deformity at that level.  She is advised regarding surgery to decompress and stabilize this area which would include fixation up to the T6 area.  Procedure: Patient was brought to the operating room supine on the stretcher.  After the smooth induction of general tracheal anesthesia she was carefully turned prone.  The back was prepped with alcohol DuraPrep and draped in a sterile fashion.  Midline incision was created from the region of the superior portion of the previous incision cephalad to about the level of T6.  Dissection was carried down to the thoracic go dorsal fascia which was opened on either side of midline until the superiormost hardware was exposed.  Then the dissection was carried out in a subperiosteal fashion to expose the laminar arches and transverse processes of T9 T8 T7 and T6.  The robotic arm was then fastened to the spinous processes of T  11.  Registration radiographs were obtained and robotic plan was confirmed for pedicle screw fixation at T6-T7-T8 and T9.  Pedicle entry sites were then chosen at each of these levels and a K wire was passed into the pedicle of T6 and T7-T8 and T9 using the robotic arm for assistance.  Once this was accomplished radiographs were obtained to verify K wire positioning and then the screws were placed using a 4.5 x 45 mm screw on the right at T6 5.5 x 50 mm screw on the left at T6 4.5 x 45 mm screw on the right at T7 5.5 x 50 mm on the left at T7-T8 placed 5.5 x 45 mm screws at T9 I placed 5.5 x 45 mm screws there.  Once the screws were placed vertebral augmentation was performed using 1 cc of methacrylate placed into each of the screws at T6-T7 and T8.  The screws and T9 were noted to be very sclerotic bone and augmentation was not performed at this level.  Once all the screws were placed then attention was turned and a laminectomy was performed at the T9-T10 level.  At this time the capsule overlying the T10-T11 and T12 screws were all loosened and the rod connector at the the T12-L1 level was loosened so the rod could be removed.  The T10 screws were noted to be very loose in the bone and were easily removed on either side.  Laminectomy was completed and the common dural tube was explored carefully lateral aspect of the dural tube near the T10 pedicle was noted to be displaced medially and this was carefully drilled away so as to decompress the lateral and ventral dura around the  T10 region.  Once this was decompressed the common dural tube appeared much more relaxed.  This was done bilaterally.  Hemostasis in the soft tissues was obtained meticulously at this point it was felt that stabilization or using a posterior construct with some graft in the far lateral gutters would be appropriate.  This was performed by contouring a 240 mm rod to the appropriate contour to sit within the saddles of the screws from T6 to the  gap between the L2 rods distally.  The rods were placed and secured in a neutral construct.  At this point posterior arthrodesis was performed by decorticating the laminar arches of T6 T7-T8-T9 and the upper lumbar spaces.  10 cc of Magnifuse was placed on either side across the gap of the laminectomy and then 20 cc of allograft bone was chopped finely mixed with Proteus.  This was placed posteriorly from T6 down to L2.  Once all the grafting was performed the final radiographs were obtained in AP and lateral projection demonstrated good fixation with the hardware in good position.  Hemostasis was carefully obtained and then the thoracodorsal fascia was closed with #1 Vicryl in interrupted fashion 2-0 Vicryl was used in the subcutaneous tissues 3-0 Vicryl subcuticularly 4-0 Vicryl in the superficial subcuticular layer and staples in the skin.  Blood loss for the procedure was estimated 250 cc and no Cell Saver blood was returned.

## 2020-10-27 NOTE — Transfer of Care (Signed)
Immediate Anesthesia Transfer of Care Note  Patient: [name]  Procedure(s) Performed: Thoracic nine-ten Laminectomy with extension of fusion from Thoracic ten to Thoracic six with segmental fixation (cemented) with pedicle screw and mazor APPLICATION OF ROBOTIC ASSISTANCE FOR SPINAL PROCEDURE  Patient Location: PACU  Anesthesia Type:General  Level of Consciousness: drowsy, patient cooperative and responds to stimulation  Airway & Oxygen Therapy: Patient Spontanous Breathing and Patient connected to face mask oxygen  Post-op Assessment: Report given to RN, Post -op Vital signs reviewed and stable and Patient moving all extremities X 4  Post vital signs: Reviewed and stable  Last Vitals:  Vitals Value Taken Time  BP 119/59 10/27/20 1342  Temp    Pulse 70 10/27/20 1345  Resp 14 10/27/20 1345  SpO2 99 % 10/27/20 1345  Vitals shown include unvalidated device data.  Last Pain:  Vitals:   10/27/20 0611  TempSrc:   PainSc: 0-No pain         Complications: No notable events documented.

## 2020-10-28 LAB — BASIC METABOLIC PANEL
Anion gap: 6 (ref 5–15)
BUN: 9 mg/dL (ref 8–23)
CO2: 27 mmol/L (ref 22–32)
Calcium: 8.9 mg/dL (ref 8.9–10.3)
Chloride: 100 mmol/L (ref 98–111)
Creatinine, Ser: 0.58 mg/dL (ref 0.44–1.00)
GFR, Estimated: >60 mL/min (ref >60)
Glucose, Bld: 124 mg/dL — ABNORMAL HIGH (ref 70–99)
Potassium: 4.4 mmol/L (ref 3.5–5.1)
Sodium: 133 mmol/L — ABNORMAL LOW (ref 135–145)

## 2020-10-28 LAB — CBC
HCT: 27.9 % — ABNORMAL LOW (ref 36.0–46.0)
Hemoglobin: 9.2 g/dL — ABNORMAL LOW (ref 12.0–15.0)
MCH: 30.7 pg (ref 26.0–34.0)
MCHC: 33 g/dL (ref 30.0–36.0)
MCV: 93 fL (ref 80.0–100.0)
Platelets: 226 10*3/uL (ref 150–400)
RBC: 3 MIL/uL — ABNORMAL LOW (ref 3.87–5.11)
RDW: 13.1 % (ref 11.5–15.5)
WBC: 8.1 10*3/uL (ref 4.0–10.5)
nRBC: 0 % (ref 0.0–0.2)

## 2020-10-28 MED ORDER — HYDROCODONE-ACETAMINOPHEN 5-325 MG PO TABS: 1.0000 | ORAL_TABLET | ORAL | 0 refills | Status: DC | PRN

## 2020-10-28 MED ORDER — HYDROXYZINE HCL 50 MG/ML IM SOLN
50.0000 mg | Freq: Four times a day (QID) | INTRAMUSCULAR | Status: DC | PRN
  Administered 2020-10-28: 50 mg via INTRAMUSCULAR
  Filled 2020-10-28: qty 1

## 2020-10-28 MED ORDER — HYDROCODONE-ACETAMINOPHEN 5-325 MG PO TABS
1.0000 | ORAL_TABLET | ORAL | Status: DC | PRN
  Administered 2020-10-28 – 2020-10-29 (×5): 2 via ORAL
  Filled 2020-10-28 (×5): qty 2

## 2020-10-28 MED ORDER — METHOCARBAMOL 500 MG PO TABS: 500.0000 mg | ORAL_TABLET | Freq: Four times a day (QID) | ORAL | 3 refills | Status: DC | PRN

## 2020-10-28 NOTE — Discharge Summary (Signed)
Physician Discharge Summary  Patient ID: [name] MRN: [id number] DOB/AGE: 1932/11/12 87 y.o.  Admit date: 10/27/2020 Discharge date: 10/28/2020  Admission Diagnoses: T9-T10 fracture status post fusion T10 to the sacrum acute thoracic kyphosis.  Thoracic myelopathy.  Discharge Diagnoses: T9-T10 fracture status post fusion T10 to the sacrum.  Acute thoracic kyphosis.  Thoracic myelopathy. Active Problems:   Myelopathy concurrent with and due to spinal stenosis of thoracic region [facility])   Discharged Condition: fair  Hospital Course: Patient was admitted to undergo revision surgery to remove upper hardware from a T10 fracture with a kyphotic deformity.  She is also developed significant stenosis this was relieved with laminectomy.  Fixation was performed from T6 to the T12 vertebrae.  She tolerated surgery well.  Consults: None  Significant Diagnostic Studies: None  Treatments: surgery: See op note  Discharge Exam: Blood pressure 138/62, pulse 64, temperature 98 F (36.7 C), resp. rate 18, height '5\' 6"'$  (1.676 m), weight 82.9 kg, SpO2 99 %. Incision is clean and dry motor function is intact.  Disposition: Discharge disposition: 01-Home or Self Care       Discharge Instructions     Call MD for:  redness, tenderness, or signs of infection (pain, swelling, redness, odor or green/yellow discharge around incision site)   Complete by: As directed    Call MD for:  severe uncontrolled pain   Complete by: As directed    Call MD for:  temperature >100.4   Complete by: As directed    Diet - low sodium heart healthy   Complete by: As directed    Discharge wound care:   Complete by: As directed    Change dressing daily with dry gauze.  Paint incision with Betadine.  Patient may shower when dressing is removed before change.   Incentive spirometry RT   Complete by: As directed    Increase activity slowly   Complete by: As directed       Allergies as of 10/28/2020        Reactions   Keflex [cephalexin] Hives        Medication List     STOP taking these medications    oxyCODONE 5 MG immediate release tablet Commonly known as: Oxy IR/ROXICODONE       TAKE these medications    aspirin EC 325 MG tablet Take 1 tablet (325 mg total) by mouth 2 (two) times daily.   b complex vitamins capsule Take 1 capsule by mouth daily.   B-50 PO Take 1 tablet by mouth at bedtime.   BLACK PEPPER-TURMERIC PO Take 1 capsule by mouth daily.   diphenhydramine-acetaminophen 25-500 MG Tabs tablet Commonly known as: TYLENOL PM Take 2 tablets by mouth at bedtime.   ferrous sulfate 325 (65 FE) MG tablet Take 325 mg by mouth daily with breakfast.   HYALURONIC ACID PO Take 1 capsule by mouth daily. Injuv Form   HYDROcodone-acetaminophen 5-325 MG tablet Commonly known as: NORCO/VICODIN Take 1-2 tablets by mouth every 4 (four) hours as needed for moderate pain or severe pain.   levothyroxine 50 MCG tablet Commonly known as: SYNTHROID Take 50 mcg by mouth daily before breakfast.   methocarbamol 500 MG tablet Commonly known as: ROBAXIN Take 1 tablet (500 mg total) by mouth every 6 (six) hours as needed for muscle spasms.   multivitamin with minerals Tabs tablet Take 1 tablet by mouth at bedtime.   omeprazole 20 MG capsule Commonly known as: PRILOSEC Take 20 mg by mouth in the morning.  OVER THE COUNTER MEDICATION Take 1 capsule by mouth daily. Lemon Flavonoid   SYSTANE OP Place 1 drop into both eyes 2 (two) times daily as needed (dry eyes).   tiZANidine 2 MG tablet Commonly known as: ZANAFLEX Take 1 tablet (2 mg total) by mouth every 6 (six) hours as needed for muscle spasms.   vitamin B-12 500 MCG tablet Commonly known as: CYANOCOBALAMIN Take 500 mcg by mouth daily.   zinc gluconate 50 MG tablet Take 50 mg by mouth daily.               Discharge Care Instructions  (From admission, onward)           Start     Ordered    10/28/20 0000  Discharge wound care:       Comments: Change dressing daily with dry gauze.  Paint incision with Betadine.  Patient may shower when dressing is removed before change.   10/28/20 1643             Signed: Blanchie Dessert Miklo Aken 10/28/2020, 4:43 PM

## 2020-10-28 NOTE — Discharge Instructions (Signed)
Wound Care You may shower. Change dressing as needed Do not scrub directly on incision.  Do not put any creams, lotions, or ointments on incision. Activity Walk each and every day, increasing distance each day. No lifting greater than 5 lbs.  Avoid excessive back  bending. No driving for 2 weeks; may ride as a passenger locally.  Diet Resume your normal diet.   Call Your Doctor If Any of These Occur Redness, drainage, or swelling at the wound.  Temperature greater than 101 degrees. Severe pain not relieved by pain medication. Increased difficulty swallowing. Incision starts to come apart. Follow Up Appt Call today for appointment in 3 weeks [id number]) or for problems.  If you have any hardware placed in your spine, you will need an x-ray before your appointment.

## 2020-10-28 NOTE — Progress Notes (Signed)
Patient ID: [name], female   DOB: 10/17/1932, 87 y.o.   MRN: [id number] Vital signs are stable Patient is feeling fair but complaining of some nausea She like to stay another day I believe that she will need some home health care for wound checks I discussed with dressing changes with the husband however he is somewhat limited in his capacity.  We will ask for home health nurse to check her incisions daily for the next 5 days. I will change her pain medicine to hydrocodone perhaps this will not make her is nauseated.

## 2020-10-28 NOTE — Evaluation (Signed)
Occupational Therapy Evaluation Patient Details Name: [name] MRN: [id number] DOB: 01-29-1933 Today's Date: 10/28/2020    History of Present Illness 87 yo female s/p 7/26 T9-10 Laminectomy with extension Fusion from T10-T6 with segmental fixation (cemented) with pedicle screw and mazor PMH arthritis GERD PONV ptosis both eyelids vertigo back surgery R distal interphalangeal joint fusion ORIF L phalanx R shoulder surgery R TKA   Clinical Impression   Patient is s/p back surgery resulting in functional limitations due to the deficits listed below (see OT problem list). Pt currently supervision for transfers with RW and able to don doff brace appropriately. Pt currently with PONV feeling and provided cool wash cloth, ice chips/ ginger ale. Pt has AE and DME required at home Patient will benefit from skilled OT acutely to increase independence and safety with ADLS to allow discharge home.     Follow Up Recommendations  No OT follow up    Equipment Recommendations  3 in 1 bedside commode (requesting for second bathroom at home)    Recommendations for Other Services       Precautions / Restrictions Precautions Precautions: Back Required Braces or Orthoses: Spinal Brace Spinal Brace: Lumbar corset;Applied in sitting position      Mobility Bed Mobility Overal bed mobility: Needs Assistance Bed Mobility: Supine to Sit     Supine to sit: Supervision     General bed mobility comments: min cues for sequence    Transfers Overall transfer level: Needs assistance Equipment used: Rolling walker (2 wheeled) Transfers: Sit to/from Stand Sit to Stand: Min guard         General transfer comment: elevated surface to help with transfer    Balance Overall balance assessment: Mild deficits observed, not formally tested                                         ADL either performed or assessed with clinical judgement   ADL Overall ADL's : Needs  assistance/impaired Eating/Feeding: Modified independent       Upper Body Bathing: Min guard   Lower Body Bathing: Minimal assistance;With adaptive equipment;Sit to/from stand       Lower Body Dressing: Minimal assistance;Sit to/from stand;With adaptive equipment (uses reacher at baseline)   Toilet Transfer: Min guard;Ambulation;RW;BSC       Tub/ Shower Transfer: Min guard   Functional mobility during ADLs: Min guard;Rolling walker General ADL Comments: pt uses BSC at home but requesting a second one for her second floor due to split level     Vision Baseline Vision/History: No visual deficits       Perception     Praxis      Pertinent Vitals/Pain Pain Assessment: Faces Faces Pain Scale: Hurts a little bit Pain Location: back Pain Descriptors / Indicators: Grimacing;Operative site guarding Pain Intervention(s): Monitored during session;Premedicated before session;Repositioned     Hand Dominance Right   Extremity/Trunk Assessment Upper Extremity Assessment Upper Extremity Assessment: Overall WFL for tasks assessed   Lower Extremity Assessment Lower Extremity Assessment: Overall WFL for tasks assessed   Cervical / Trunk Assessment Cervical / Trunk Assessment:  (s/p surgery)   Communication Communication Communication: No difficulties   Cognition Arousal/Alertness: Awake/alert Behavior During Therapy: WFL for tasks assessed/performed Overall Cognitive Status: Within Functional Limits for tasks assessed  General Comments  dressing dry and intact    Exercises     Shoulder Instructions      Home Living Family/patient expects to be discharged to:: Private residence Living Arrangements: Spouse/significant other Available Help at Discharge: Family Type of Home: House Home Access: Stairs to enter (split level) Technical brewer of Steps: 1 Entrance Stairs-Rails: Left Home Layout: Multi-level (split  level home) Alternate Level Stairs-Number of Steps: 4 up to bedroom Alternate Level Stairs-Rails: Right Bathroom Shower/Tub: Occupational psychologist: Standard Bathroom Accessibility: Yes   Home Equipment: Environmental consultant - 2 wheels;Cane - single point;Bedside commode;Shower seat;Grab bars - tub/shower;Adaptive equipment Adaptive Equipment: Reacher Additional Comments: spouse is elderly as well but abel to help per patient. pt works out at [employer] in [place] [employer] [place] 3 x per week      Prior Functioning/Environment Level of Independence: Independent                 OT Problem List: Decreased strength;Decreased activity tolerance;Impaired balance (sitting and/or standing);Pain;Decreased knowledge of precautions;Decreased knowledge of use of DME or AE      OT Treatment/Interventions: Self-care/ADL training;DME and/or AE instruction;Therapeutic activities;Patient/family education;Balance training    OT Goals(Current goals can be found in the care plan section) Acute Rehab OT Goals Patient Stated Goal: to go home and start water aerobics soon as i can OT Goal Formulation: With patient Time For Goal Achievement: 11/11/20 Potential to Achieve Goals: Good  OT Frequency: Min 2X/week   Barriers to D/C:            Co-evaluation              AM-PAC OT "6 Clicks" Daily Activity     Outcome Measure Help from another person eating meals?: None Help from another person taking care of personal grooming?: None Help from another person toileting, which includes using toliet, bedpan, or urinal?: A Little Help from another person bathing (including washing, rinsing, drying)?: A Little Help from another person to put on and taking off regular upper body clothing?: None Help from another person to put on and taking off regular lower body clothing?: A Little 6 Click Score: 21   End of Session Equipment Utilized During Treatment: Gait belt;Back brace Nurse Communication: Mobility  status;Precautions  Activity Tolerance: Patient tolerated treatment well Patient left: Other (comment) (in hall with PT laura)  OT Visit Diagnosis: Unsteadiness on feet (R26.81);Muscle weakness (generalized) (M62.81);Pain                Time: [id number] OT Time Calculation (min): 28 min Charges:  OT General Charges $OT Visit: 1 Visit OT Evaluation $OT Eval Moderate Complexity: 1 Mod OT Treatments $Self Care/Home Management : 8-22 mins   Brynn, OTR/L  Acute Rehabilitation Services Pager: [phone] Office: [phone] .   Jeri Modena 10/28/2020, 10:13 AM

## 2020-10-28 NOTE — Evaluation (Signed)
Physical Therapy Evaluation Patient Details Name: [name] MRN: [id number] DOB: 1932/10/20 Today's Date: 10/28/2020   History of Present Illness  Pt is an 87 y/o female who presents s/p posterior decompression and stabilization of T9-T10 fracture with fixation from T6-T12 on 10/27/2020. PMH significant for ptosis both eyelids, vertigo, back surgery, R distal interphalangeal joint fusion, ORIF L phalanx, R shoulder surgery, R TKA.  Clinical Impression  Pt admitted with above diagnosis. At the time of PT eval, pt was able to demonstrate transfers and ambulation with gross min guard assist to supervision for safety and RW for support. Pt was educated on precautions, brace application/wearing schedule, appropriate activity progression, and car transfer. Pt currently with functional limitations due to the deficits listed below (see PT Problem List). Pt will benefit from skilled PT to increase their independence and safety with mobility to allow discharge to the venue listed below.      Follow Up Recommendations No PT follow up;Supervision for mobility/OOB    Equipment Recommendations  None recommended by PT    Recommendations for Other Services       Precautions / Restrictions Precautions Precautions: Back Precaution Booklet Issued: Yes (comment) Precaution Comments: Reviewed handout and pt was cued for precautions during functional mobility. Required Braces or Orthoses: Spinal Brace Spinal Brace: Lumbar corset;Applied in sitting position Restrictions Weight Bearing Restrictions: No      Mobility  Bed Mobility Overal bed mobility: Needs Assistance Bed Mobility: Sit to Sidelying;Rolling Rolling: Modified independent (Device/Increase time)       Sit to sidelying: Supervision General bed mobility comments: VC's for optimal log roll technique. Pt able to perform without assistance.    Transfers Overall transfer level: Needs assistance Equipment used: Rolling walker (2  wheeled) Transfers: Sit to/from Stand Sit to Stand: Supervision         General transfer comment: VC's for hand placement on seated surface for safety. No assist required.  Ambulation/Gait Ambulation/Gait assistance: Supervision Gait Distance (Feet): 400 Feet Assistive device: Rolling walker (2 wheeled) Gait Pattern/deviations: Step-through pattern;Decreased stride length;Trunk flexed Gait velocity: Decreased Gait velocity interpretation: 1.31 - 2.62 ft/sec, indicative of limited community ambulator General Gait Details: VC's for improved posture, closer walker proximity, and forward gaze. No assist required however supervision provided for safety.  Stairs Stairs: Yes Stairs assistance: Min guard Stair Management: One rail Right;Step to pattern;Forwards Number of Stairs: 5 General stair comments: VC's for sequencing and general safety. No assist required however hands on guarding provided throughout.  Wheelchair Mobility    Modified Rankin (Stroke Patients Only)       Balance Overall balance assessment: Mild deficits observed, not formally tested                                           Pertinent Vitals/Pain Pain Assessment: Faces Faces Pain Scale: Hurts little more Pain Location: back Pain Descriptors / Indicators: Grimacing;Operative site guarding Pain Intervention(s): Limited activity within patient's tolerance;Monitored during session;Repositioned    Home Living Family/patient expects to be discharged to:: Private residence Living Arrangements: Spouse/significant other Available Help at Discharge: Family Type of Home: House Home Access: Stairs to enter (split level) Entrance Stairs-Rails: Left Entrance Stairs-Number of Steps: 1 Home Layout: Multi-level (split level home) Home Equipment: Walker - 2 wheels;Cane - single point;Bedside commode;Shower seat;Grab bars - tub/shower;Adaptive equipment Additional Comments: spouse is elderly as well  but abel to help per  patient. pt works out at [employer] in [place] [employer] 3 x per week    Prior Function Level of Independence: Independent               Hand Dominance   Dominant Hand: Right    Extremity/Trunk Assessment   Upper Extremity Assessment Upper Extremity Assessment: Defer to OT evaluation    Lower Extremity Assessment Lower Extremity Assessment: Generalized weakness    Cervical / Trunk Assessment Cervical / Trunk Assessment: Other exceptions Cervical / Trunk Exceptions: s/p surgery  Communication   Communication: No difficulties  Cognition Arousal/Alertness: Awake/alert Behavior During Therapy: WFL for tasks assessed/performed Overall Cognitive Status: Within Functional Limits for tasks assessed                                        General Comments      Exercises     Assessment/Plan    PT Assessment Patient needs continued PT services  PT Problem List Decreased strength;Decreased balance;Decreased activity tolerance;Decreased mobility;Decreased knowledge of use of DME;Decreased safety awareness;Decreased knowledge of precautions;Pain       PT Treatment Interventions DME instruction;Gait training;Functional mobility training;Therapeutic activities;Therapeutic exercise;Neuromuscular re-education;Patient/family education;Stair training;Balance training    PT Goals (Current goals can be found in the Care Plan section)  Acute Rehab PT Goals Patient Stated Goal: to go home and start water aerobics soon as i can PT Goal Formulation: With patient Time For Goal Achievement: 11/04/20 Potential to Achieve Goals: Good    Frequency Min 5X/week   Barriers to discharge        Co-evaluation               AM-PAC PT "6 Clicks" Mobility  Outcome Measure Help needed turning from your back to your side while in a flat bed without using bedrails?: None Help needed moving from lying on your back to sitting on the side of a flat bed  without using bedrails?: None Help needed moving to and from a bed to a chair (including a wheelchair)?: A Little Help needed standing up from a chair using your arms (e.g., wheelchair or bedside chair)?: A Little Help needed to walk in hospital room?: A Little Help needed climbing 3-5 steps with a railing? : A Little 6 Click Score: 20    End of Session Equipment Utilized During Treatment: Gait belt;Back brace Activity Tolerance: Patient tolerated treatment well Patient left: in bed;with call bell/phone within reach Nurse Communication: Mobility status PT Visit Diagnosis: Unsteadiness on feet (R26.81);Pain Pain - part of body:  (back)    Time: [id number] PT Time Calculation (min) (ACUTE ONLY): 19 min   Charges:   PT Evaluation $PT Eval Low Complexity: 1 Low          Rolinda Roan, PT, DPT Acute Rehabilitation Services Pager: [phone] Office: [phone]   Thelma Comp 10/28/2020, 2:02 PM

## 2020-10-29 ENCOUNTER — Encounter (HOSPITAL_COMMUNITY): Payer: Self-pay | Admitting: Neurological Surgery

## 2020-10-29 MED ORDER — PHENYLEPHRINE HCL-NACL 10-0.9 MG/250ML-% IV SOLN
INTRAVENOUS | Status: AC
  Filled 2020-10-29: qty 250

## 2020-10-29 MED FILL — Heparin Sodium (Porcine) Inj 1000 Unit/ML: INTRAMUSCULAR | Qty: 30 | Status: AC

## 2020-10-29 MED FILL — Sodium Chloride IV Soln 0.9%: INTRAVENOUS | Qty: 1000 | Status: AC

## 2020-10-29 NOTE — Progress Notes (Signed)
Pt. discharged home accompanied by family member.  Prescriptions and discharge instructions given with verbalization of understanding. Incision site on back with no s/s of infection - no swelling, redness, bleeding, and/or drainage noted. Opportunity given to ask questions but no question asked. Pt. transported out of this unit in wheelchair by the volunteer

## 2020-10-29 NOTE — Plan of Care (Signed)
Adequately Ready for Discharge 

## 2020-10-29 NOTE — Progress Notes (Signed)
Physical Therapy Treatment Patient Details Name: [name] MRN: [id number] DOB: 1933/04/01 Today's Date: 10/29/2020    History of Present Illness Pt is an 87 y/o female who presents s/p posterior decompression and stabilization of T9-T10 fracture with fixation from T6-T12 on 10/27/2020. PMH significant for ptosis both eyelids, vertigo, back surgery, R distal interphalangeal joint fusion, ORIF L phalanx, R shoulder surgery, R TKA.    PT Comments    Pt progressing well with post-op mobility. She was able to demonstrate transfers with up to modified independence and ambulation with gross min guard assist to supervision for safety with RW for support. Reinforced education on precautions, brace application/wearing schedule, appropriate activity progression, and car transfer. Will continue to follow.      Follow Up Recommendations  No PT follow up;Supervision for mobility/OOB     Equipment Recommendations  None recommended by PT    Recommendations for Other Services       Precautions / Restrictions Precautions Precautions: Back Precaution Booklet Issued: Yes (comment) Precaution Comments: Reviewed handout and pt was cued for precautions during functional mobility. Required Braces or Orthoses: Spinal Brace Spinal Brace: Lumbar corset;Applied in sitting position Restrictions Weight Bearing Restrictions: No    Mobility  Bed Mobility Overal bed mobility: Modified Independent Bed Mobility: Rolling;Sidelying to Sit Rolling: Modified independent (Device/Increase time) Sidelying to sit: Modified independent (Device/Increase time)       General bed mobility comments: VC's for optimal log roll technique. No assist required.    Transfers Overall transfer level: Needs assistance Equipment used: Rolling walker (2 wheeled) Transfers: Sit to/from Stand Sit to Stand: Supervision         General transfer comment: VC's for hand placement on seated surface for safety. No assist  required.  Ambulation/Gait Ambulation/Gait assistance: Supervision Gait Distance (Feet): 400 Feet Assistive device: Rolling walker (2 wheeled) Gait Pattern/deviations: Step-through pattern;Decreased stride length;Trunk flexed Gait velocity: Decreased Gait velocity interpretation: <1.31 ft/sec, indicative of household ambulator General Gait Details: VC's for improved posture, closer walker proximity, and forward gaze. No assist required however supervision provided for safety. Several standing rest breaks required due to fatigue.   Stairs         General stair comments: Verbally reviewed stair education. Pt declines practice as she reports she feels comfortable.   Wheelchair Mobility    Modified Rankin (Stroke Patients Only)       Balance Overall balance assessment: Mild deficits observed, not formally tested                                          Cognition Arousal/Alertness: Awake/alert Behavior During Therapy: WFL for tasks assessed/performed Overall Cognitive Status: Within Functional Limits for tasks assessed                                        Exercises      General Comments        Pertinent Vitals/Pain Pain Assessment: Faces Faces Pain Scale: Hurts a little bit Pain Location: back Pain Descriptors / Indicators: Grimacing;Operative site guarding Pain Intervention(s): Limited activity within patient's tolerance;Monitored during session;Repositioned    Home Living                      Prior Function  PT Goals (current goals can now be found in the care plan section) Acute Rehab PT Goals Patient Stated Goal: to go home and start water aerobics soon as i can PT Goal Formulation: With patient Time For Goal Achievement: 11/04/20 Potential to Achieve Goals: Good Progress towards PT goals: Progressing toward goals    Frequency    Min 5X/week      PT Plan Current plan remains appropriate     Co-evaluation              AM-PAC PT "6 Clicks" Mobility   Outcome Measure  Help needed turning from your back to your side while in a flat bed without using bedrails?: None Help needed moving from lying on your back to sitting on the side of a flat bed without using bedrails?: None Help needed moving to and from a bed to a chair (including a wheelchair)?: A Little Help needed standing up from a chair using your arms (e.g., wheelchair or bedside chair)?: A Little Help needed to walk in hospital room?: A Little Help needed climbing 3-5 steps with a railing? : A Little 6 Click Score: 20    End of Session Equipment Utilized During Treatment: Gait belt;Back brace Activity Tolerance: Patient tolerated treatment well Patient left: in bed;with call bell/phone within reach Nurse Communication: Mobility status PT Visit Diagnosis: Unsteadiness on feet (R26.81);Pain Pain - part of body:  (back)     Time: [id number] PT Time Calculation (min) (ACUTE ONLY): 17 min  Charges:  $Gait Training: 8-22 mins                     Rolinda Roan, PT, DPT Acute Rehabilitation Services Pager: [phone] Office: [phone]    Thelma Comp 10/29/2020, 1:10 PM

## 2020-10-29 NOTE — TOC Transition Note (Signed)
Transition of Care [facility]) - CM/SW Discharge Note   Patient Details  Name: ALESE OATS MRN: [id number] Date of Birth: 12/21/1932  Transition of Care [facility]) CM/SW Contact:  Pollie Friar, RN Phone Number: 10/29/2020, 9:10 AM   Clinical Narrative:    Patient discharging home with [facility] services through [facility]. DME will be provided by unit staff as needed.  Pt has transport home.   Final next level of care: Home w Home Health Services Barriers to Discharge: No Barriers Identified   Patient Goals and CMS Choice   CMS Medicare.gov Compare Post Acute Care list provided to:: Patient Choice offered to / list presented to : Patient  Discharge Placement                       Discharge Plan and Services                          HH Arranged: PT, OT, RN [facility] Agency: [place] Date [place]: 10/29/20   Representative spoke with at [place]: [place] ([place]) Interventions     Readmission Risk Interventions No flowsheet data found.

## 2020-10-29 NOTE — Progress Notes (Signed)
  NEUROSURGERY PROGRESS NOTE   No issues overnight. Pt doing well, pain under control  EXAM:  BP 124/71 (BP Location: Right Arm)   Pulse 82   Temp 98.1 F (36.7 C) (Oral)   Resp 17   Ht '5\' 6"'$  (1.676 m)   Wt 82.9 kg   SpO2 96%   BMI 29.50 kg/m   Awake, alert, oriented  Speech fluent, appropriate  CN grossly intact  5/5 BUE/BLE   IMPRESSION:  87 y.o. female POD#2 s/p decompression/stabilization of T9-10 fracture. Doing well  PLAN: - d/c home today   Consuella Lose, MD [facility]

## 2020-10-29 NOTE — Progress Notes (Signed)
Occupational Therapy Treatment Patient Details Name: [name] MRN: [id number] DOB: 23-Aug-1932 Today's Date: 10/29/2020    History of present illness Pt is an 87 y/o female who presents s/p posterior decompression and stabilization of T9-T10 fracture with fixation from T6-T12 on 10/27/2020. PMH significant for ptosis both eyelids, vertigo, back surgery, R distal interphalangeal joint fusion, ORIF L phalanx, R shoulder surgery, R TKA.   OT comments  Pt with gradual progress towards goals, able to demo log rolling for bed mobility with Modified Independence. Pt improving to supervision for mobility to/from bathroom using RW. However, pt continues to have difficulty recalling spinal precautions without cues at start of session and during ADLs. Pt reports unsure if ready to return home today. Based on continued difficulty with spinal precaution recall, updated DC recs to [name]. Noted pt with LSO brace rather than TLSO brace per discussion with staff.    Follow Up Recommendations  Home health OT;Supervision - Intermittent    Equipment Recommendations  3 in 1 bedside commode (requesting for second bathroom at home)    Recommendations for Other Services      Precautions / Restrictions Precautions Precautions: Back Precaution Booklet Issued: Yes (comment) Required Braces or Orthoses: Spinal Brace Spinal Brace: Lumbar corset;Applied in sitting position Restrictions Weight Bearing Restrictions: No       Mobility Bed Mobility Overal bed mobility: Modified Independent Bed Mobility: Rolling;Sidelying to Sit Rolling: Modified independent (Device/Increase time) Sidelying to sit: Modified independent (Device/Increase time)       General bed mobility comments: use of bed rails    Transfers Overall transfer level: Needs assistance Equipment used: Rolling walker (2 wheeled) Transfers: Sit to/from Stand Sit to Stand: Supervision         General transfer comment: VC's for hand  placement on seated surface for safety. No assist required.    Balance Overall balance assessment: Mild deficits observed, not formally tested                                         ADL either performed or assessed with clinical judgement   ADL Overall ADL's : Needs assistance/impaired Eating/Feeding: Independent;Sitting   Grooming: Set up;Standing;Wash/dry hands               Lower Body Dressing: Minimal assistance;Sit to/from stand;With adaptive equipment Lower Body Dressing Details (indicate cue type and reason): assist to adjust socks, unable to cross to reach feet well. Reports using reacher, sock aide at baseline Toilet Transfer: Supervision/safety;Ambulation;BSC;Regular Toilet;RW Toilet Transfer Details (indicate cue type and reason): BSC over toilet to increase ease of transfers [place] and Hygiene: Supervision/safety;Sit to/from stand Toileting - Clothing Manipulation Details (indicate cue type and reason): Cues for back precautions, to avoid bending when reaching to flush toilet     Functional mobility during ADLs: Supervision/safety;Rolling walker General ADL Comments: pt uses BSC at home but requesting a second one for her second floor due to split level. Unable to recall "no BLT" for precautions requiring cues     Vision   Vision Assessment?: No apparent visual deficits   Perception     Praxis      Cognition Arousal/Alertness: Awake/alert Behavior During Therapy: WFL for tasks assessed/performed Overall Cognitive Status: Impaired/Different from baseline Area of Impairment: Memory                     Memory: Decreased recall  of precautions         General Comments: difficulty recalling precautions despite cues. WFL for all other aspects        Exercises     Shoulder Instructions       General Comments Noted ortho tech note for attempted TLSO delivery after surgery with pt reporting already  having brace. On entry, pt with LSO brace (no orders specifying what brace type pt should have). Notified RN who reports MD instructed pt to use LSO despite long incision on back and surgery in thoracic region    Pertinent Vitals/ Pain       Pain Assessment: Faces Faces Pain Scale: Hurts a little bit Pain Location: back Pain Descriptors / Indicators: Grimacing;Operative site guarding Pain Intervention(s): Monitored during session  Home Living                                          Prior Functioning/Environment              Frequency  Min 2X/week        Progress Toward Goals  OT Goals(current goals can now be found in the care plan section)  Progress towards OT goals: Progressing toward goals  Acute Rehab OT Goals Patient Stated Goal: to go home and start water aerobics soon as i can OT Goal Formulation: With patient Time For Goal Achievement: 11/11/20 Potential to Achieve Goals: Good ADL Goals Additional ADL Goal #1: pt will complete bed mobility mod I as precursor for adls Additional ADL Goal #2: pt will complete basic transfer from chair mod I with chair arm rest  Plan Discharge plan needs to be updated    Co-evaluation                 AM-PAC OT "6 Clicks" Daily Activity     Outcome Measure   Help from another person eating meals?: None Help from another person taking care of personal grooming?: None Help from another person toileting, which includes using toliet, bedpan, or urinal?: A Little Help from another person bathing (including washing, rinsing, drying)?: A Little Help from another person to put on and taking off regular upper body clothing?: None Help from another person to put on and taking off regular lower body clothing?: A Little 6 Click Score: 21    End of Session Equipment Utilized During Treatment: Rolling walker;Back brace  OT Visit Diagnosis: Unsteadiness on feet (R26.81);Muscle weakness (generalized)  (M62.81);Pain Pain - part of body:  (back)   Activity Tolerance Patient tolerated treatment well   Patient Left in bed;with call bell/phone within reach   Nurse Communication Mobility status;Precautions;Other (comment) (TLSO vs LSO brace)        Time: [id number] OT Time Calculation (min): 14 min  Charges: OT General Charges $OT Visit: 1 Visit OT Treatments $Self Care/Home Management : 8-22 mins  Malachy Chamber, OTR/L Acute Rehab Services Office: [phone]    Layla Maw 10/29/2020, 7:23 AM

## 2021-01-06 ENCOUNTER — Other Ambulatory Visit: Payer: Self-pay

## 2021-01-06 ENCOUNTER — Ambulatory Visit: Payer: Medicare Other | Attending: Neurological Surgery | Admitting: Physical Therapy

## 2021-01-06 ENCOUNTER — Encounter: Payer: Self-pay | Admitting: Physical Therapy

## 2021-01-06 DIAGNOSIS — M6281 Muscle weakness (generalized): Secondary | ICD-10-CM | POA: Insufficient documentation

## 2021-01-06 DIAGNOSIS — M62830 Muscle spasm of back: Secondary | ICD-10-CM | POA: Diagnosis present

## 2021-01-06 DIAGNOSIS — R262 Difficulty in walking, not elsewhere classified: Secondary | ICD-10-CM | POA: Diagnosis present

## 2021-01-06 DIAGNOSIS — M546 Pain in thoracic spine: Secondary | ICD-10-CM | POA: Diagnosis present

## 2021-01-06 DIAGNOSIS — M545 Low back pain, unspecified: Secondary | ICD-10-CM | POA: Insufficient documentation

## 2021-01-06 NOTE — Therapy (Signed)
[place]. [place], [place], [address] Phone: [phone]   Fax:  [phone]  Physical Therapy Evaluation  Patient Details  Name: [name] MRN: [id number] Date of Birth: [birth year] Referring Provider (PT): Elsner   Encounter Date: 01/06/2021   PT End of Session - 01/06/21 1817     Visit Number 1    Date for PT Re-Evaluation 04/08/21    PT Start Time 1754    PT Stop Time 1840    PT Time Calculation (min) 46 min    Activity Tolerance Patient limited by pain    Behavior During Therapy [employer] for tasks assessed/performed             Past Medical History:  Diagnosis Date   Acalculous cholecystitis 08/24/2018   Arthritis    DVT of lower extremity (deep venous thrombosis) (HCC)    right leg - after back surgery in 2019   GERD (gastroesophageal reflux disease)    History of blood transfusion    Hypothyroidism    PONV (postoperative nausea and vomiting)    Ptosis of both eyelids    Vertigo    Wears glasses    reading    Past Surgical History:  Procedure Laterality Date   ABDOMINAL HYSTERECTOMY  8937   APPLICATION OF ROBOTIC ASSISTANCE FOR SPINAL PROCEDURE N/A 07/17/2017   Procedure: APPLICATION OF ROBOTIC ASSISTANCE FOR SPINAL PROCEDURE;  Surgeon: Kristeen Miss, MD;  Location: [place];  Service: Neurosurgery;  Laterality: N/A;   APPLICATION OF ROBOTIC ASSISTANCE FOR SPINAL PROCEDURE N/A 09/01/2017   Procedure: APPLICATION OF ROBOTIC ASSISTANCE FOR SPINAL PROCEDURE;  Surgeon: Kristeen Miss, MD;  Location: [place];  Service: Neurosurgery;  Laterality: N/A;   APPLICATION OF ROBOTIC ASSISTANCE FOR SPINAL PROCEDURE N/A 10/27/2020   Procedure: APPLICATION OF ROBOTIC ASSISTANCE FOR SPINAL PROCEDURE;  Surgeon: Kristeen Miss, MD;  Location: [place];  Service: Neurosurgery;  Laterality: N/A;   BACK SURGERY  1986   lumb lam   CARPAL TUNNEL RELEASE  2000   right   CARPAL TUNNEL RELEASE  2012   left    CHOLECYSTECTOMY N/A 08/24/2018   Procedure: LAPAROSCOPIC CHOLECYSTECTOMY WITH INTRAOPERATIVE CHOLANGIOGRAM;  Surgeon: Fanny Skates, MD;  [facility];  Service: General;  Laterality: N/A;   COLONOSCOPY     CYSTOCELE REPAIR  2007   sling   DISTAL INTERPHALANGEAL JOINT FUSION Right 02/04/2014   Procedure: FUSION DISTAL INTERPHALANGEAL JOINT RIGHT INDEX FINGER ;  Surgeon: Daryll Brod, MD;  Location: [place];  Service: Orthopedics;  Laterality: Right;   EYE SURGERY Bilateral    Cataract surgery with lens implant   KNEE SURGERY  2009,2011   partial knee   LAPAROSCOPY N/A 08/24/2018   Procedure: LAPAROSCOPY DIAGNOSTIC;  Surgeon: Fanny Skates, MD;  Location: [place];  Service: General;  Laterality: N/A;   LUMBAR PERCUTANEOUS PEDICLE SCREW 4 LEVEL N/A 10/27/2020   Procedure: Thoracic nine-ten Laminectomy with extension of fusion from Thoracic ten to Thoracic six with segmental fixation (cemented) with pedicle screw and mazor;  Surgeon: Kristeen Miss, MD;  Location: [place];  Service: Neurosurgery;  Laterality: N/A;   OPEN REDUCTION INTERNAL FIXATION (ORIF) PROXIMAL PHALANX Left 08/30/2013   Procedure: OPEN REDUCTION INTERNAL FIXATION (ORIF) PROXIMAL PHALANX FRACTURE LEFT SMALL FINGER; SPLINT RING FINGER;  Surgeon: Wynonia Sours, MD;  Location: [place];  Service: Orthopedics;  Laterality: Left;   POSTERIOR LUMBAR FUSION 4 LEVEL N/A 07/17/2017   Procedure: Thoracic Ten -  Lumbar Five revison of hardware with Mazor;  Surgeon: Kristeen Miss, MD;  Location: [place];  Service: Neurosurgery;  Laterality: N/A;  Thoracic/Lumbar   PTOSIS REPAIR Bilateral 07/27/2015   Procedure: PTOSIS REPAIR;  Surgeon: Cristine Polio, MD;  Location: [place];  Service: Plastics;  Laterality: Bilateral;   SHOULDER SURGERY     rt rcr,and lt   TONSILLECTOMY     TOTAL KNEE ARTHROPLASTY Right 12/23/2019   Procedure: TOTAL KNEE ARTHROPLASTY;  Surgeon: Vickey Huger, MD;  [facility];  Service: Orthopedics;  Laterality: Right;    There were no vitals filed for this visit.    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  She has developed degeneration across the thoracolumbar spine.  Her initial problem started back in 2019 when she had some lower lumbar and mid lumbar degenerative changes.  She underwent decompression and fusion from L2-L5 and subsequently had fracture of the L2 screws.  She then required stabilization across the thoracolumbar junction which was performed about a year later.  This remained stable but then in 2021 she had degenerative changes at L5-S1 and underwent fusion across the lumbosacral junction with fixation to the pelvis.  She had been stable until the last number of months when she notes increasing thoracic back pain and now weakness in her lower extremities she is walking with the use of a walker.  A follow-up study demonstrates that at the superior portion of her fixation at his T9-T10 she has developed a kyphosis with collapse of the T9 vertebrae and the T10 vertebrae has had dislocation of the superiormost screws.  The screws had been augmented.  Patient does have some modest osteoporosis that is being treated.  After careful consideration of her situation I advised that she would require decompression via laminectomy and extension of the fusion to the T6 vertebrae.  The screws were also be augmented.  The above was the MD note.  She stayed 2 nights int he hospital.  She wore a brace about 2 weeks.  She reports that she started hvaing significant spasms in the thoracic area this past week, she was prescribed a mm relaxer and nerve medication    Limitations Lifting;Standing;Walking    How long can you stand comfortably? able to stand some to cook, has to sit regularly    How long can you walk comfortably? less than 5 minutes due  to pain    Patient Stated Goals have less back pain, move better, walk better, better strength in the right LE    Currently in Pain? Yes    Pain Score 7     Pain Location Back    Pain Orientation Mid;Lower    Pain Descriptors / Indicators Shooting    Pain Type Acute pain;Surgical pain    Pain Radiating Towards weakness in the right LE    Pain Onset More than a month ago    Pain Frequency Intermittent    Aggravating Factors  reports started having significant spasms lsat week, unsure of why    Pain Relieving Factors taking the medication, heat helpscan get pain to a 0/10    Effect of Pain on Daily Activities limits everything with the pain, weakness limits any thing on her feet                [facility] PT Assessment - 01/06/21 0001  Assessment   Medical Diagnosis Thoracic back pain with fusion 10/27/20    Referring Provider (PT) Elsner    Onset Date/Surgical Date 10/27/20    Prior Therapy the past year      Precautions   Precautions None      Balance Screen   Has the patient fallen in the past 6 months No    Has the patient had a decrease in activity level because of a fear of falling?  Yes    Is the patient reluctant to leave their home because of a fear of falling?  No      Home Environment   Additional Comments split level home, needs to be able to do this, likes to do yardwork      Prior Function   Level of Independence Independent with community mobility with device    Vocation Retired    Leisure no exercise      ROM / Strength   AROM / PROM / Strength Strength      AROM   Overall AROM Comments lumbar ROM very limited, I did not ask her to do much today due to the spasms that she is having    Right Knee Extension 0    Right Knee Flexion 110      Strength   Strength Assessment Site Hip;Knee;Ankle    Right/Left Hip Right;Left    Right Hip Flexion 3+/5    Left Hip Flexion 3+/5    Right/Left Knee Right;Left    Right Knee Flexion 3+/5    Right Knee  Extension 3+/5    Left Knee Flexion 4/5    Left Knee Extension 4/5    Right/Left Ankle Right;Left    Right Ankle Dorsiflexion 3+/5    Right Ankle Plantar Flexion 3+/5    Left Ankle Dorsiflexion 4/5    Left Ankle Plantar Flexion 4/5      Palpation   Palpation comment extremely painful and tender in the right thoracic area and the left thoracic area      Ambulation/Gait   Gait Comments uses a FWW, slow mild antalgic on the right, trendelenberg ont he right      Timed Up and Go Test   Normal TUG (seconds) 45    TUG Comments with FWW and she is in a lot of pain today                        Objective measurements completed on examination: See above findings.                  PT Short Term Goals - 01/06/21 1826       PT SHORT TERM GOAL #1   Title independent with initial HEP    Time 2    Period Weeks    Status New               PT Long Term Goals - 01/06/21 1826       PT LONG TERM GOAL #1   Title decrease pain 50%    Time 12    Period Weeks    Status New      PT LONG TERM GOAL #2   Title increase lumbar ROM 25% with less pain    Time 12    Period Weeks    Status New      PT LONG TERM GOAL #3   Title increase LE strength to 4+/5    Time 12  Period Weeks    Status New      PT LONG TERM GOAL #4   Title go up and down stairs step over step    Time 12    Period Weeks    Status New      PT LONG TERM GOAL #5   Title decrease TUG to 18 seconds    Time 12    Period Weeks    Status New                    Plan - 01/06/21 1819     Clinical Impression Statement Patient underwent an extensive surgery 10/27/20, she has a fusion and scres and laminectomy in the thoracic area, she has past hx of lumbar fusion and fusions from the sacrum to T4-5 now.  She reports that she has noticed some increase in right LE weakness.  She reports that over the past week she started having significant spasms in the thoracic area, MD called  in two medications for her, she reports that pain was originally left side, reports that today she is having more right thoracic pain, describes pain on the left as a nerve and the pain on the right like a spasm mm.  I was very limited in the testing with evaluation today due to the sharp and significant pain.  She has difficulty with walking and weakness of the right LE    Stability/Clinical Decision Making Evolving/Moderate complexity    Clinical Decision Making Moderate    Rehab Potential Good    PT Frequency 2x / week    PT Duration 12 weeks    PT Treatment/Interventions ADLs/Self Care Home Management;Cryotherapy;Gait training;Neuromuscular re-education;Balance training;Therapeutic exercise;Therapeutic activities;Functional mobility training;Stair training;Patient/family education;Manual techniques;Ultrasound;Moist Heat;Electrical Stimulation    PT Next Visit Plan try to help her pain and see if we can get her moving    Consulted and Agree with Plan of Care Patient             Patient will benefit from skilled therapeutic intervention in order to improve the following deficits and impairments:  Abnormal gait, Decreased range of motion, Decreased balance, Decreased scar mobility, Impaired flexibility, Decreased strength, Decreased mobility, Decreased endurance, Pain, Cardiopulmonary status limiting activity, Decreased activity tolerance, Postural dysfunction, Improper body mechanics, Increased muscle spasms, Difficulty walking  Visit Diagnosis: Acute bilateral low back pain without sciatica - Plan: PT plan of care cert/re-cert  Muscle spasm of back - Plan: PT plan of care cert/re-cert  Difficulty in walking, not elsewhere classified - Plan: PT plan of care cert/re-cert  Muscle weakness (generalized) - Plan: PT plan of care cert/re-cert  Pain in thoracic spine - Plan: PT plan of care cert/re-cert     Problem List Patient Active Problem List   Diagnosis Date Noted   Myelopathy  concurrent with and due to spinal stenosis of thoracic region ([place]) 10/27/2020   S/P total knee arthroplasty, right 12/23/2019   Acalculous cholecystitis 08/24/2018   Osteoarthritis of right knee    Vertigo    DDD (degenerative disc disease), cervical    Benign essential HTN    History of DVT (deep vein thrombosis)    Tachycardia    Steroid-induced hyperglycemia    Hypothyroidism    Neuropathic pain    Acute blood loss anemia    S/P lumbar fusion    Closed L5 vertebral fracture (HCC) 08/31/2017   Closed fracture of fifth lumbar vertebra ([place]) 08/30/2017   Lumbar vertebral fracture ([place]) 07/17/2017   Scoliosis  06/13/2017    Sumner Boast, PT 01/06/2021, 6:49 PM  [place]. [place], [place], [address] Phone: [phone]   Fax:  [phone]  Name: HARIKA LAIDLAW MRN: [id number] Date of Birth: 12-Dec-1932

## 2021-01-07 ENCOUNTER — Ambulatory Visit: Payer: Medicare Other | Admitting: Physical Therapy

## 2021-01-12 ENCOUNTER — Ambulatory Visit: Payer: Medicare Other | Admitting: Physical Therapy

## 2021-01-14 ENCOUNTER — Other Ambulatory Visit: Payer: Self-pay | Admitting: Internal Medicine

## 2021-01-14 ENCOUNTER — Other Ambulatory Visit: Payer: Self-pay

## 2021-01-14 ENCOUNTER — Encounter: Payer: Self-pay | Admitting: Physical Therapy

## 2021-01-14 ENCOUNTER — Ambulatory Visit
Admission: RE | Admit: 2021-01-14 | Discharge: 2021-01-14 | Disposition: A | Discharge status: Home / Self Care | Payer: Medicare Other | Source: Ambulatory Visit | Attending: Internal Medicine | Admitting: Internal Medicine

## 2021-01-14 ENCOUNTER — Ambulatory Visit: Payer: Medicare Other | Admitting: Physical Therapy

## 2021-01-14 DIAGNOSIS — M6281 Muscle weakness (generalized): Secondary | ICD-10-CM

## 2021-01-14 DIAGNOSIS — M546 Pain in thoracic spine: Secondary | ICD-10-CM

## 2021-01-14 DIAGNOSIS — R262 Difficulty in walking, not elsewhere classified: Secondary | ICD-10-CM

## 2021-01-14 DIAGNOSIS — R0781 Pleurodynia: Secondary | ICD-10-CM

## 2021-01-14 DIAGNOSIS — M62830 Muscle spasm of back: Secondary | ICD-10-CM

## 2021-01-14 DIAGNOSIS — M545 Low back pain, unspecified: Secondary | ICD-10-CM

## 2021-01-14 NOTE — Therapy (Signed)
[place]. [place], [place], [address] Phone: [phone]   Fax:  [phone]  Physical Therapy Treatment  Patient Details  Name: [name] MRN: [id number] Date of Birth: [birth year] Referring Provider (PT): Elsner   Encounter Date: 01/14/2021   PT End of Session - 01/14/21 1523     Visit Number 2    Date for PT Re-Evaluation 04/08/21    PT Start Time 1434    PT Stop Time 1525    PT Time Calculation (min) 51 min    Activity Tolerance Patient tolerated treatment well    Behavior During Therapy [facility] for tasks assessed/performed             Past Medical History:  Diagnosis Date   Acalculous cholecystitis 08/24/2018   Arthritis    DVT of lower extremity (deep venous thrombosis) (HCC)    right leg - after back surgery in 2019   GERD (gastroesophageal reflux disease)    History of blood transfusion    Hypothyroidism    PONV (postoperative nausea and vomiting)    Ptosis of both eyelids    Vertigo    Wears glasses    reading    Past Surgical History:  Procedure Laterality Date   ABDOMINAL HYSTERECTOMY  6606   APPLICATION OF ROBOTIC ASSISTANCE FOR SPINAL PROCEDURE N/A 07/17/2017   Procedure: APPLICATION OF ROBOTIC ASSISTANCE FOR SPINAL PROCEDURE;  Surgeon: Kristeen Miss, MD;  Location: [place];  Service: Neurosurgery;  Laterality: N/A;   APPLICATION OF ROBOTIC ASSISTANCE FOR SPINAL PROCEDURE N/A 09/01/2017   Procedure: APPLICATION OF ROBOTIC ASSISTANCE FOR SPINAL PROCEDURE;  Surgeon: Kristeen Miss, MD;  Location: [place];  Service: Neurosurgery;  Laterality: N/A;   APPLICATION OF ROBOTIC ASSISTANCE FOR SPINAL PROCEDURE N/A 10/27/2020   Procedure: APPLICATION OF ROBOTIC ASSISTANCE FOR SPINAL PROCEDURE;  Surgeon: Kristeen Miss, MD;  Location: [place];  Service: Neurosurgery;  Laterality: N/A;   BACK SURGERY  1986   lumb lam   CARPAL TUNNEL RELEASE  2000   right   CARPAL TUNNEL RELEASE  2012   left    CHOLECYSTECTOMY N/A 08/24/2018   Procedure: LAPAROSCOPIC CHOLECYSTECTOMY WITH INTRAOPERATIVE CHOLANGIOGRAM;  Surgeon: Fanny Skates, MD;  Location: [facility];  Service: General;  Laterality: N/A;   COLONOSCOPY     CYSTOCELE REPAIR  2007   sling   DISTAL INTERPHALANGEAL JOINT FUSION Right 02/04/2014   Procedure: FUSION DISTAL INTERPHALANGEAL JOINT RIGHT INDEX FINGER ;  Surgeon: Daryll Brod, MD;  Location: [place];  Service: Orthopedics;  Laterality: Right;   EYE SURGERY Bilateral    Cataract surgery with lens implant   KNEE SURGERY  2009,2011   partial knee   LAPAROSCOPY N/A 08/24/2018   Procedure: LAPAROSCOPY DIAGNOSTIC;  Surgeon: Fanny Skates, MD;  Location: [place];  Service: General;  Laterality: N/A;   LUMBAR PERCUTANEOUS PEDICLE SCREW 4 LEVEL N/A 10/27/2020   Procedure: Thoracic nine-ten Laminectomy with extension of fusion from Thoracic ten to Thoracic six with segmental fixation (cemented) with pedicle screw and mazor;  Surgeon: Kristeen Miss, MD;  Location: [place];  Service: Neurosurgery;  Laterality: N/A;   OPEN REDUCTION INTERNAL FIXATION (ORIF) PROXIMAL PHALANX Left 08/30/2013   Procedure: OPEN REDUCTION INTERNAL FIXATION (ORIF) PROXIMAL PHALANX FRACTURE LEFT SMALL FINGER; SPLINT RING FINGER;  Surgeon: Wynonia Sours, MD;  Location: [place];  Service: Orthopedics;  Laterality: Left;   POSTERIOR LUMBAR FUSION 4 LEVEL N/A 07/17/2017   Procedure: Thoracic Ten -  Lumbar Five revison of hardware with Mazor;  Surgeon: Kristeen Miss, MD;  Location: [place];  Service: Neurosurgery;  Laterality: N/A;  Thoracic/Lumbar   PTOSIS REPAIR Bilateral 07/27/2015   Procedure: PTOSIS REPAIR;  Surgeon: Cristine Polio, MD;  Location: [place];  Service: Plastics;  Laterality: Bilateral;   SHOULDER SURGERY     rt rcr,and lt   TONSILLECTOMY     TOTAL KNEE ARTHROPLASTY Right 12/23/2019   Procedure: TOTAL KNEE ARTHROPLASTY;  Surgeon: Vickey Huger, MD;  [facility];  Service: Orthopedics;  Laterality: Right;    There were no vitals filed for this visit.   Subjective Assessment - 01/14/21 1443     Subjective Patient reports that the right thoracic and back pain got better but the pain went to the left rib area, she hd x-rays this morning,    Currently in Pain? Yes    Pain Score 4     Pain Location Back    Pain Orientation Right;Left;Mid;Lower    Aggravating Factors  that last treatment helped but I would like to exercise                               [facility] Adult PT Treatment/Exercise - 01/14/21 0001       Knee/Hip Exercises: Aerobic   Nustep level 5 x 7 minutes      Knee/Hip Exercises: Standing   Heel Raises Both;2 sets;10 reps    Hip Flexion Right;3 sets;10 reps    Hip Flexion Limitations 2    Hip Abduction Right;3 sets;10 reps    Abduction Limitations 2    Hip Extension Right;3 sets;10 reps    Extension Limitations 2      Knee/Hip Exercises: Seated   Long Arc Quad Both;10 reps;3 sets    Long Arc Quad Weight 2 lbs.    Ball Squeeze 20    Other Seated Knee/Hip Exercises right ankle DF, red tband for PF and eversion all 3x10    Marching Right;3 sets;10 reps                       PT Short Term Goals - 01/14/21 1525       PT SHORT TERM GOAL #1   Title independent with initial HEP    Status On-going               PT Long Term Goals - 01/06/21 1826       PT LONG TERM GOAL #1   Title decrease pain 50%    Time 12    Period Weeks    Status New      PT LONG TERM GOAL #2   Title increase lumbar ROM 25% with less pain    Time 12    Period Weeks    Status New      PT LONG TERM GOAL #3   Title increase LE strength to 4+/5    Time 12    Period Weeks    Status New      PT LONG TERM GOAL #4   Title go up and down stairs step over step    Time 12    Period Weeks    Status New      PT LONG TERM GOAL #5   Title decrease TUG to 18 seconds    Time 12    Period Weeks    Status New  Plan - 01/14/21 1524     Clinical Impression Statement Patient with much less pain today, she had x-rays this morning, reports pain under the left rib area, started exercises today and she did well with this, the left ankle and leg is weak    PT Next Visit Plan try to help her pain and see if we can get her moving    Consulted and Agree with Plan of Care Patient             Patient will benefit from skilled therapeutic intervention in order to improve the following deficits and impairments:  Abnormal gait, Decreased range of motion, Decreased balance, Decreased scar mobility, Impaired flexibility, Decreased strength, Decreased mobility, Decreased endurance, Pain, Cardiopulmonary status limiting activity, Decreased activity tolerance, Postural dysfunction, Improper body mechanics, Increased muscle spasms, Difficulty walking  Visit Diagnosis: Acute bilateral low back pain without sciatica  Muscle spasm of back  Difficulty in walking, not elsewhere classified  Muscle weakness (generalized)  Pain in thoracic spine     Problem List Patient Active Problem List   Diagnosis Date Noted   Myelopathy concurrent with and due to spinal stenosis of thoracic region ([place]) 10/27/2020   S/P total knee arthroplasty, right 12/23/2019   Acalculous cholecystitis 08/24/2018   Osteoarthritis of right knee    Vertigo    DDD (degenerative disc disease), cervical    Benign essential HTN    History of DVT (deep vein thrombosis)    Tachycardia    Steroid-induced hyperglycemia    Hypothyroidism    Neuropathic pain    Acute blood loss anemia    S/P lumbar fusion    Closed L5 vertebral fracture (HCC) 08/31/2017   Closed fracture of fifth lumbar vertebra ([place]) 08/30/2017   Lumbar vertebral fracture ([place]) 07/17/2017   Scoliosis 06/13/2017    Sumner Boast, PT 01/14/2021, 3:26 PM  [place]. [place], [place], [address] Phone: [phone]   Fax:  [phone]  Name: [name] MRN: [id number] Date of Birth: 09-19-1932

## 2021-01-19 ENCOUNTER — Ambulatory Visit: Payer: Medicare Other | Admitting: Physical Therapy

## 2021-01-20 ENCOUNTER — Other Ambulatory Visit: Payer: Self-pay

## 2021-01-20 ENCOUNTER — Emergency Department (HOSPITAL_COMMUNITY)
Admission: EM | Admit: 2021-01-20 | Discharge: 2021-01-21 | Disposition: A | Discharge status: Home / Self Care | Payer: Medicare Other | Attending: Emergency Medicine | Admitting: Emergency Medicine

## 2021-01-20 ENCOUNTER — Encounter (HOSPITAL_COMMUNITY): Payer: Self-pay | Admitting: Emergency Medicine

## 2021-01-20 DIAGNOSIS — E039 Hypothyroidism, unspecified: Secondary | ICD-10-CM | POA: Insufficient documentation

## 2021-01-20 DIAGNOSIS — Z79899 Other long term (current) drug therapy: Secondary | ICD-10-CM | POA: Insufficient documentation

## 2021-01-20 DIAGNOSIS — I10 Essential (primary) hypertension: Secondary | ICD-10-CM | POA: Insufficient documentation

## 2021-01-20 DIAGNOSIS — Z87891 Personal history of nicotine dependence: Secondary | ICD-10-CM | POA: Diagnosis not present

## 2021-01-20 DIAGNOSIS — R1012 Left upper quadrant pain: Secondary | ICD-10-CM | POA: Diagnosis present

## 2021-01-20 DIAGNOSIS — N3000 Acute cystitis without hematuria: Secondary | ICD-10-CM | POA: Diagnosis not present

## 2021-01-20 DIAGNOSIS — Z96651 Presence of right artificial knee joint: Secondary | ICD-10-CM | POA: Insufficient documentation

## 2021-01-20 LAB — CBC WITH DIFFERENTIAL/PLATELET
Abs Immature Granulocytes: 0.03 10*3/uL (ref 0.00–0.07)
Basophils Absolute: 0 10*3/uL (ref 0.0–0.1)
Basophils Relative: 1 %
Eosinophils Absolute: 0.1 10*3/uL (ref 0.0–0.5)
Eosinophils Relative: 2 %
HCT: 37.5 % (ref 36.0–46.0)
Hemoglobin: 11.7 g/dL — ABNORMAL LOW (ref 12.0–15.0)
Immature Granulocytes: 0 %
Lymphocytes Relative: 19 %
Lymphs Abs: 1.4 10*3/uL (ref 0.7–4.0)
MCH: 28.3 pg (ref 26.0–34.0)
MCHC: 31.2 g/dL (ref 30.0–36.0)
MCV: 90.6 fL (ref 80.0–100.0)
Monocytes Absolute: 0.8 10*3/uL (ref 0.1–1.0)
Monocytes Relative: 11 %
Neutro Abs: 4.9 10*3/uL (ref 1.7–7.7)
Neutrophils Relative %: 67 %
Platelets: 323 10*3/uL (ref 150–400)
RBC: 4.14 MIL/uL (ref 3.87–5.11)
RDW: 13.4 % (ref 11.5–15.5)
WBC: 7.3 10*3/uL (ref 4.0–10.5)
nRBC: 0 % (ref 0.0–0.2)

## 2021-01-20 MED ORDER — HYDROMORPHONE HCL 1 MG/ML IJ SOLN
0.5000 mg | Freq: Once | INTRAMUSCULAR | Status: AC
  Administered 2021-01-20: 0.5 mg via INTRAVENOUS
  Filled 2021-01-20: qty 1

## 2021-01-20 MED ORDER — ONDANSETRON HCL 4 MG/2ML IJ SOLN
4.0000 mg | Freq: Once | INTRAMUSCULAR | Status: AC
  Administered 2021-01-20: 4 mg via INTRAVENOUS
  Filled 2021-01-20: qty 2

## 2021-01-20 NOTE — ED Triage Notes (Signed)
Pt c/o LUQ pain that radiates to her back x 1 week. Pain worse with movement, seen by PCP, given pain medications with no relief. Pt denies fall/injury.

## 2021-01-20 NOTE — ED Provider Notes (Addendum)
[facility] EMERGENCY DEPARTMENT Provider Note   CSN: [id number] Arrival date & time: 01/20/21  2226     History Chief Complaint  Patient presents with   Abdominal Pain    [name] is a 87 y.o. female with a history of hypothyroidism, T 9-10 laminectomy with extension of fusion from T10-T6 with segmental fixation in July 2022 who presents the emergency department with a chief complaint of abdominal pain.   The patient reports constant left upper quadrant abdominal pain for the last week.  She reports the pain was initially radiating into her back, but is now constant and nonradiating in the left upper quadrant.  She has been taking her home tramadol and methylprednisolone with some improvement in pain, but it does not resolve.  She has also had intermittent improvement with applying heating pads to the abdomen.  She has had a hard time finding a difficult position.  Pain is not worse with ambulation or with positional changes.  Pain is not pleuritic or postprandial.  She has had no recent falls, trauma, or injuries.  No fever, chills, nausea, cough, shortness of breath, chest pain, vomiting, diarrhea, constipation, dysuria, hematuria, urinary frequency or hesitancy.  She has been passing flatus.  No history of similar pain.  Surgical history includes cholecystectomy.  She has a history of multiple back surgeries.  Most recent was July 2022.  The history is provided by the patient and medical records. No language interpreter was used.      Past Medical History:  Diagnosis Date   Acalculous cholecystitis 08/24/2018   Arthritis    DVT of lower extremity (deep venous thrombosis) (HCC)    right leg - after back surgery in 2019   GERD (gastroesophageal reflux disease)    History of blood transfusion    Hypothyroidism    PONV (postoperative nausea and vomiting)    Ptosis of both eyelids    Vertigo    Wears glasses    reading    Patient Active Problem List    Diagnosis Date Noted   Myelopathy concurrent with and due to spinal stenosis of thoracic region ([place]) 10/27/2020   S/P total knee arthroplasty, right 12/23/2019   Acalculous cholecystitis 08/24/2018   Osteoarthritis of right knee    Vertigo    DDD (degenerative disc disease), cervical    Benign essential HTN    History of DVT (deep vein thrombosis)    Tachycardia    Steroid-induced hyperglycemia    Hypothyroidism    Neuropathic pain    Acute blood loss anemia    S/P lumbar fusion    Closed L5 vertebral fracture ([place]) 08/31/2017   Closed fracture of fifth lumbar vertebra ([place]) 08/30/2017   Lumbar vertebral fracture ([place]) 07/17/2017   Scoliosis 06/13/2017    Past Surgical History:  Procedure Laterality Date   ABDOMINAL HYSTERECTOMY  6384   APPLICATION OF ROBOTIC ASSISTANCE FOR SPINAL PROCEDURE N/A 07/17/2017   Procedure: APPLICATION OF ROBOTIC ASSISTANCE FOR SPINAL PROCEDURE;  Surgeon: Kristeen Miss, MD;  Location: [place];  Service: Neurosurgery;  Laterality: N/A;   APPLICATION OF ROBOTIC ASSISTANCE FOR SPINAL PROCEDURE N/A 09/01/2017   Procedure: APPLICATION OF ROBOTIC ASSISTANCE FOR SPINAL PROCEDURE;  Surgeon: Kristeen Miss, MD;  Location: [place];  Service: Neurosurgery;  Laterality: N/A;   APPLICATION OF ROBOTIC ASSISTANCE FOR SPINAL PROCEDURE N/A 10/27/2020   Procedure: APPLICATION OF ROBOTIC ASSISTANCE FOR SPINAL PROCEDURE;  Surgeon: Kristeen Miss, MD;  Location: [place];  Service: Neurosurgery;  Laterality: N/A;  BACK SURGERY  1986   lumb lam   CARPAL TUNNEL RELEASE  2000   right   CARPAL TUNNEL RELEASE  2012   left   CHOLECYSTECTOMY N/A 08/24/2018   Procedure: LAPAROSCOPIC CHOLECYSTECTOMY WITH INTRAOPERATIVE CHOLANGIOGRAM;  Surgeon: Fanny Skates, MD;  Location: [facility] OR;  Service: General;  Laterality: N/A;   COLONOSCOPY     CYSTOCELE REPAIR  2007   sling   DISTAL INTERPHALANGEAL JOINT FUSION Right 02/04/2014   Procedure: FUSION DISTAL INTERPHALANGEAL JOINT RIGHT INDEX  FINGER ;  Surgeon: Daryll Brod, MD;  Location: [place];  Service: Orthopedics;  Laterality: Right;   EYE SURGERY Bilateral    Cataract surgery with lens implant   KNEE SURGERY  2009,2011   partial knee   LAPAROSCOPY N/A 08/24/2018   Procedure: LAPAROSCOPY DIAGNOSTIC;  Surgeon: Fanny Skates, MD;  Location: [place];  Service: General;  Laterality: N/A;   LUMBAR PERCUTANEOUS PEDICLE SCREW 4 LEVEL N/A 10/27/2020   Procedure: Thoracic nine-ten Laminectomy with extension of fusion from Thoracic ten to Thoracic six with segmental fixation (cemented) with pedicle screw and mazor;  Surgeon: Kristeen Miss, MD;  Location: [place];  Service: Neurosurgery;  Laterality: N/A;   OPEN REDUCTION INTERNAL FIXATION (ORIF) PROXIMAL PHALANX Left 08/30/2013   Procedure: OPEN REDUCTION INTERNAL FIXATION (ORIF) PROXIMAL PHALANX FRACTURE LEFT SMALL FINGER; SPLINT RING FINGER;  Surgeon: Wynonia Sours, MD;  Location: [place];  Service: Orthopedics;  Laterality: Left;   POSTERIOR LUMBAR FUSION 4 LEVEL N/A 07/17/2017   Procedure: Thoracic Ten - Lumbar Five revison of hardware with Mazor;  Surgeon: Kristeen Miss, MD;  Location: [place];  Service: Neurosurgery;  Laterality: N/A;  Thoracic/Lumbar   PTOSIS REPAIR Bilateral 07/27/2015   Procedure: PTOSIS REPAIR;  Surgeon: Cristine Polio, MD;  Location: [place];  Service: Plastics;  Laterality: Bilateral;   SHOULDER SURGERY     rt rcr,and lt   TONSILLECTOMY     TOTAL KNEE ARTHROPLASTY Right 12/23/2019   Procedure: TOTAL KNEE ARTHROPLASTY;  Surgeon: Vickey Huger, MD;  Location: [facility];  Service: Orthopedics;  Laterality: Right;     OB History   No obstetric history on file.     Family History  Problem Relation Age of Onset   Diabetes Sister    Breast cancer Neg Hx     Social History   Tobacco Use   Smoking status: Former   Smokeless tobacco: Never   Tobacco comments:    07/14/17-  quit for 62  Vaping Use   Vaping Use:  Never used  Substance Use Topics   Alcohol use: No   Drug use: No    Home Medications Prior to Admission medications   Medication Sig Start Date End Date Taking? Authorizing Provider  nitrofurantoin, macrocrystal-monohydrate, (MACROBID) 100 MG capsule Take 1 capsule (100 mg total) by mouth 2 (two) times daily. 01/21/21  Yes Migdalia Olejniczak A, PA-C  b complex vitamins capsule Take 1 capsule by mouth daily.    [provider]  BLACK PEPPER-TURMERIC PO Take 1 capsule by mouth daily.    [provider]  diphenhydramine-acetaminophen (TYLENOL PM) 25-500 MG TABS tablet Take 2 tablets by mouth at bedtime.    [provider]  ferrous sulfate 325 (65 FE) MG tablet Take 325 mg by mouth daily with breakfast.    [provider]  HYDROcodone-acetaminophen (NORCO/VICODIN) 5-325 MG tablet Take 1-2 tablets by mouth every 4 (four) hours as needed for moderate pain or severe pain. 10/28/20  Kristeen Miss, MD  levothyroxine (SYNTHROID, LEVOTHROID) 50 MCG tablet Take 50 mcg by mouth daily before breakfast.     [provider]  methocarbamol (ROBAXIN) 500 MG tablet Take 1 tablet (500 mg total) by mouth every 6 (six) hours as needed for muscle spasms. 10/28/20   Kristeen Miss, MD  Multiple Vitamin (MULTIVITAMIN WITH MINERALS) TABS tablet Take 1 tablet by mouth at bedtime.    [provider]  omeprazole (PRILOSEC) 20 MG capsule Take 20 mg by mouth in the morning.    [provider]  OVER THE COUNTER MEDICATION Take 1 capsule by mouth daily. Lemon Flavonoid    [provider]  Polyethyl Glycol-Propyl Glycol (SYSTANE OP) Place 1 drop into both eyes 2 (two) times daily as needed (dry eyes).    [provider]  Sodium Hyaluronate, oral, (HYALURONIC ACID PO) Take 1 capsule by mouth daily. Injuv Form    [provider]  tiZANidine (ZANAFLEX) 2 MG tablet Take 1 tablet (2 mg total) by mouth every 6 (six) hours as needed for muscle spasms.  12/23/19   Donia Ast, PA  vitamin B-12 (CYANOCOBALAMIN) 500 MCG tablet Take 500 mcg by mouth daily.    [provider]  Vitamins-Lipotropics (B-50 PO) Take 1 tablet by mouth at bedtime.    [provider]  zinc gluconate 50 MG tablet Take 50 mg by mouth daily.    [provider]    Allergies    Keflex [cephalexin]  Review of Systems   Review of Systems  Constitutional:  Negative for activity change, appetite change, chills and fever.  HENT:  Negative for congestion and sore throat.   Eyes:  Negative for visual disturbance.  Respiratory:  Negative for cough, shortness of breath and wheezing.   Cardiovascular:  Negative for chest pain.  Gastrointestinal:  Positive for abdominal distention and abdominal pain. Negative for blood in stool, constipation, diarrhea, nausea and vomiting.  Genitourinary:  Negative for dysuria, flank pain, frequency, hematuria, pelvic pain, urgency, vaginal bleeding and vaginal pain.  Musculoskeletal:  Negative for arthralgias, back pain, gait problem, joint swelling, myalgias, neck pain and neck stiffness.  Skin:  Negative for rash and wound.  Allergic/Immunologic: Negative for immunocompromised state.  Neurological:  Negative for dizziness, seizures, syncope, weakness, numbness and headaches.  Psychiatric/Behavioral:  Negative for confusion.    Physical Exam Updated Vital Signs BP (!) 157/76 (BP Location: Left Arm)   Pulse 65   Temp 98.3 F (36.8 C) (Oral)   Resp 18   SpO2 94%   Physical Exam Vitals and nursing note reviewed.  Constitutional:      General: She is not in acute distress.    Appearance: She is not ill-appearing, toxic-appearing or diaphoretic.  HENT:     Head: Normocephalic.  Eyes:     Conjunctiva/sclera: Conjunctivae normal.  Cardiovascular:     Rate and Rhythm: Normal rate and regular rhythm.     Heart sounds: No murmur heard.   No friction rub. No gallop.  Pulmonary:     Effort: Pulmonary  effort is normal. No respiratory distress.     Breath sounds: No stridor. No wheezing, rhonchi or rales.  Chest:     Chest wall: No tenderness.  Abdominal:     General: There is no distension.     Palpations: Abdomen is soft. There is no mass.     Tenderness: There is abdominal tenderness. There is no right CVA tenderness, left CVA tenderness, guarding or rebound.  Hernia: No hernia is present.     Comments: Tender to palpation in the left upper quadrant.  Minimal left lower quadrant tenderness.  Abdomen is soft and nondistended.  Normoactive bowel sounds.  No CVA tenderness bilaterally.  Musculoskeletal:     Cervical back: Neck supple.     Right lower leg: No edema.     Left lower leg: No edema.  Skin:    General: Skin is warm.     Findings: No rash.  Neurological:     Mental Status: She is alert.  Psychiatric:        Behavior: Behavior normal.    ED Results / Procedures / Treatments   Labs (all labs ordered are listed, but only abnormal results are displayed) Labs Reviewed  CBC WITH DIFFERENTIAL/PLATELET - Abnormal; Notable for the following components:      Result Value   Hemoglobin 11.7 (*)    All other components within normal limits  COMPREHENSIVE METABOLIC PANEL - Abnormal; Notable for the following components:   Glucose, Bld 119 (*)    All other components within normal limits  URINALYSIS, ROUTINE W REFLEX MICROSCOPIC - Abnormal; Notable for the following components:   APPearance HAZY (*)    Specific Gravity, Urine 1.031 (*)    Nitrite POSITIVE (*)    Bacteria, UA MANY (*)    All other components within normal limits  URINE CULTURE  LIPASE, BLOOD    EKG EKG Interpretation  Date/Time:  Wednesday January 20 2021 22:24:17 EDT Ventricular Rate:  77 PR Interval:  164 QRS Duration: 134 QT Interval:  412 QTC Calculation: 466 R Axis:   -49 Text Interpretation: Normal sinus rhythm Right bundle branch block Left anterior fascicular block Bifascicular block  Inferior infarct , age undetermined Abnormal ECG Interpretation limited secondary to artifact No significant change since last tracing Confirmed by Ripley Fraise [phone]) on 01/21/2021 2:02:29 AM  Radiology CT ABDOMEN PELVIS W CONTRAST  Result Date: 01/21/2021 CLINICAL DATA:  Left upper quadrant pain for 1 week, no known injury, initial encounter EXAM: CT ABDOMEN AND PELVIS WITH CONTRAST TECHNIQUE: Multidetector CT imaging of the abdomen and pelvis was performed using the standard protocol following bolus administration of intravenous contrast. CONTRAST:  33mL OMNIPAQUE IOHEXOL 350 MG/ML SOLN COMPARISON:  10/10/2018 FINDINGS: Lower chest: Lung bases are free of acute infiltrate or sizable effusion. Hepatobiliary: No focal liver abnormality is seen. Status post cholecystectomy. No biliary dilatation. Pancreas: Unremarkable. No pancreatic ductal dilatation or surrounding inflammatory changes. Spleen: Normal in size without focal abnormality. Adrenals/Urinary Tract: The adrenal glands are within normal limits. Kidneys demonstrate a normal enhancement pattern. 1 cm right renal cyst is noted stable in appearance. No calculi or obstructive changes are noted. Delayed images demonstrate normal excretion of contrast. No obstructive changes are seen. The bladder is well distended. Stomach/Bowel: Scattered diverticular change of the colon is noted without evidence of diverticulitis. The appendix is well visualized and within normal limits. No obstructive or inflammatory changes of the small bowel are seen. Large duodenal diverticulum is noted adjacent to the head of the pancreas stable in appearance from the prior exam. Stomach is within normal limits. Vascular/Lymphatic: Aortic atherosclerosis. No enlarged abdominal or pelvic lymph nodes. Reproductive: Status post hysterectomy. No adnexal masses. Other: No abdominal wall hernia or abnormality. No abdominopelvic ascites. Musculoskeletal: Extensive postsurgical changes  are noted in the thoracolumbar spine. Multilevel vertebral augmentation is seen. Stable anterolisthesis of L5 on S1 is noted. No discrete hardware failure is noted. IMPRESSION: Diverticulosis without diverticulitis. Postoperative  changes in the thoracolumbar spine. Electronically Signed   By: Inez Catalina M.D.   On: 01/21/2021 02:31    Procedures Procedures   Medications Ordered in ED Medications  HYDROmorphone (DILAUDID) injection 0.5 mg (0.5 mg Intravenous Given 01/20/21 2348)  ondansetron (ZOFRAN) injection 4 mg (4 mg Intravenous Given 01/20/21 2348)  iohexol (OMNIPAQUE) 350 MG/ML injection 75 mL (75 mLs Intravenous Contrast Given 01/21/21 0225)  oxyCODONE-acetaminophen (PERCOCET/ROXICET) 5-325 MG per tablet 1 tablet (1 tablet Oral Given 01/21/21 0357)  nitrofurantoin (macrocrystal-monohydrate) (MACROBID) capsule 100 mg (100 mg Oral Given 01/21/21 1191)    ED Course  I have reviewed the triage vital signs and the nursing notes.  Pertinent labs & imaging results that were available during my care of the patient were reviewed by me and considered in my medical decision making (see chart for details).    MDM Rules/Calculators/A&P                            87 year old female with a history of hypothyroidism, T 9-10 laminectomy with extension of fusion from T10-T6 with segmental fixation in July 2022 who presents the emergency department with a 1 week history of left upper quadrant abdominal pain.  She reports associated abdominal distention.  Vital signs are stable.  The patient was seen and independently evaluated by Dr. Christy Gentles, attending physician.  She is tender to palpation in the left upper quadrant, but abdomen is soft and nondistended.  Labs and imaging have been reviewed and independently interpreted by me.  No metabolic derangements.  CBC is unchanged from previous.  CT with diverticulosis, but no evidence of diverticulitis.  There are postoperative changes in the thoracic or  lumbar spine, but no evidence of hardware failure.  On reevaluation, patient has no midline tenderness to her cervical, thoracic, or lumbar spine.  She reports pain is significantly improved from previous.  UA is pending.  At this time, have a low suspicion for aortic dissection, pancreatitis, bowel obstruction, diverticulitis, mesenteric ischemia, obstructive uropathy, GI bleed, PE.  UA with positive nitrites.  Will treat with Macrobid for UTI.  Urine culture has been sent.  Upon further discussion, patient does now endorse that her urine has been malodorous for the last few days.  She does also have some mild gas and constipation on my read of CT.  Will recommend simethicone and MiraLAX.  Discussed plan with patient who is agreement at this time.  ER return precautions given.  She is hemodynamically stable in no acute distress.  Pain is well controlled.  Safer discharge to home with outpatient follow-up as discussed.   Final Clinical Impression(s) / ED Diagnoses Final diagnoses:  Acute cystitis without hematuria    Rx / DC Orders ED Discharge Orders          Ordered    nitrofurantoin, macrocrystal-monohydrate, (MACROBID) 100 MG capsule  2 times daily        01/21/21 0458             Joline Maxcy A, PA-C 01/21/21 0504    Smt Lokey A, PA-C 01/21/21 4782    Ripley Fraise, MD 01/21/21 [phone]

## 2021-01-21 ENCOUNTER — Emergency Department (HOSPITAL_COMMUNITY): Payer: Medicare Other

## 2021-01-21 ENCOUNTER — Encounter: Payer: Self-pay | Admitting: Physical Therapy

## 2021-01-21 ENCOUNTER — Ambulatory Visit: Payer: Medicare Other | Admitting: Physical Therapy

## 2021-01-21 DIAGNOSIS — M6281 Muscle weakness (generalized): Secondary | ICD-10-CM

## 2021-01-21 DIAGNOSIS — M62830 Muscle spasm of back: Secondary | ICD-10-CM

## 2021-01-21 DIAGNOSIS — M545 Low back pain, unspecified: Secondary | ICD-10-CM

## 2021-01-21 DIAGNOSIS — R262 Difficulty in walking, not elsewhere classified: Secondary | ICD-10-CM

## 2021-01-21 DIAGNOSIS — M546 Pain in thoracic spine: Secondary | ICD-10-CM

## 2021-01-21 LAB — COMPREHENSIVE METABOLIC PANEL
ALT: 21 U/L (ref 0–44)
AST: 24 U/L (ref 15–41)
Albumin: 3.9 g/dL (ref 3.5–5.0)
Alkaline Phosphatase: 117 U/L (ref 38–126)
Anion gap: 9 (ref 5–15)
BUN: 14 mg/dL (ref 8–23)
CO2: 26 mmol/L (ref 22–32)
Calcium: 9.1 mg/dL (ref 8.9–10.3)
Chloride: 102 mmol/L (ref 98–111)
Creatinine, Ser: 0.72 mg/dL (ref 0.44–1.00)
GFR, Estimated: >60 mL/min (ref >60)
Glucose, Bld: 119 mg/dL — ABNORMAL HIGH (ref 70–99)
Potassium: 3.9 mmol/L (ref 3.5–5.1)
Sodium: 137 mmol/L (ref 135–145)
Total Bilirubin: 0.4 mg/dL (ref 0.3–1.2)
Total Protein: 6.7 g/dL (ref 6.5–8.1)

## 2021-01-21 LAB — URINALYSIS, ROUTINE W REFLEX MICROSCOPIC
Bilirubin Urine: NEGATIVE
Glucose, UA: NEGATIVE mg/dL
Hgb urine dipstick: NEGATIVE
Ketones, ur: NEGATIVE mg/dL
Leukocytes,Ua: NEGATIVE
Nitrite: POSITIVE — AB
Protein, ur: NEGATIVE mg/dL
Specific Gravity, Urine: 1.031 — ABNORMAL HIGH (ref 1.005–1.030)
pH: 6 (ref 5.0–8.0)

## 2021-01-21 LAB — LIPASE, BLOOD: Lipase: 34 U/L (ref 11–51)

## 2021-01-21 MED ORDER — NITROFURANTOIN MONOHYD MACRO 100 MG PO CAPS
100.0000 mg | ORAL_CAPSULE | Freq: Once | ORAL | Status: AC
  Administered 2021-01-21: 100 mg via ORAL
  Filled 2021-01-21 (×2): qty 1

## 2021-01-21 MED ORDER — OXYCODONE-ACETAMINOPHEN 5-325 MG PO TABS
1.0000 | ORAL_TABLET | Freq: Once | ORAL | Status: AC
Start: 2021-01-21 — End: 2021-01-21
  Administered 2021-01-21: 1 via ORAL
  Filled 2021-01-21: qty 1

## 2021-01-21 MED ORDER — IOHEXOL 350 MG/ML SOLN
75.0000 mL | Freq: Once | INTRAVENOUS | Status: AC | PRN
  Administered 2021-01-21: 75 mL via INTRAVENOUS

## 2021-01-21 MED ORDER — NITROFURANTOIN MONOHYD MACRO 100 MG PO CAPS: 100.0000 mg | ORAL_CAPSULE | Freq: Two times a day (BID) | ORAL | 0 refills | Status: DC

## 2021-01-21 NOTE — Therapy (Signed)
[place] [place], [place], [address] Phone: [phone]   Fax:  [phone]  Physical Therapy Treatment  Patient Details  Name: [name] MRN: [id number] Date of Birth: 10/20/1932 Referring Provider (PT): Elsner   Encounter Date: 01/21/2021   PT End of Session - 01/21/21 1827     Visit Number 3    Date for PT Re-Evaluation 04/08/21    PT Start Time 1748    PT Stop Time 1838    PT Time Calculation (min) 50 min    Activity Tolerance Patient tolerated treatment well    Behavior During Therapy [facility] for tasks assessed/performed             Past Medical History:  Diagnosis Date   Acalculous cholecystitis 08/24/2018   Arthritis    DVT of lower extremity (deep venous thrombosis) (HCC)    right leg - after back surgery in 2019   GERD (gastroesophageal reflux disease)    History of blood transfusion    Hypothyroidism    PONV (postoperative nausea and vomiting)    Ptosis of both eyelids    Vertigo    Wears glasses    reading    Past Surgical History:  Procedure Laterality Date   ABDOMINAL HYSTERECTOMY  3016   APPLICATION OF ROBOTIC ASSISTANCE FOR SPINAL PROCEDURE N/A 07/17/2017   Procedure: APPLICATION OF ROBOTIC ASSISTANCE FOR SPINAL PROCEDURE;  Surgeon: Kristeen Miss, MD;  Location: [place];  Service: Neurosurgery;  Laterality: N/A;   APPLICATION OF ROBOTIC ASSISTANCE FOR SPINAL PROCEDURE N/A 09/01/2017   Procedure: APPLICATION OF ROBOTIC ASSISTANCE FOR SPINAL PROCEDURE;  Surgeon: Kristeen Miss, MD;  Location: [place];  Service: Neurosurgery;  Laterality: N/A;   APPLICATION OF ROBOTIC ASSISTANCE FOR SPINAL PROCEDURE N/A 10/27/2020   Procedure: APPLICATION OF ROBOTIC ASSISTANCE FOR SPINAL PROCEDURE;  Surgeon: Kristeen Miss, MD;  Location: [place];  Service: Neurosurgery;  Laterality: N/A;   BACK SURGERY  1986   lumb lam   CARPAL TUNNEL RELEASE  2000   right   CARPAL TUNNEL RELEASE  2012   left    CHOLECYSTECTOMY N/A 08/24/2018   Procedure: LAPAROSCOPIC CHOLECYSTECTOMY WITH INTRAOPERATIVE CHOLANGIOGRAM;  Surgeon: Fanny Skates, MD;  [facility];  Service: General;  Laterality: N/A;   COLONOSCOPY     CYSTOCELE REPAIR  2007   sling   DISTAL INTERPHALANGEAL JOINT FUSION Right 02/04/2014   Procedure: FUSION DISTAL INTERPHALANGEAL JOINT RIGHT INDEX FINGER ;  Surgeon: Daryll Brod, MD;  Location: [place];  Service: Orthopedics;  Laterality: Right;   EYE SURGERY Bilateral    Cataract surgery with lens implant   KNEE SURGERY  2009,2011   partial knee   LAPAROSCOPY N/A 08/24/2018   Procedure: LAPAROSCOPY DIAGNOSTIC;  Surgeon: Fanny Skates, MD;  Location: [place];  Service: General;  Laterality: N/A;   LUMBAR PERCUTANEOUS PEDICLE SCREW 4 LEVEL N/A 10/27/2020   Procedure: Thoracic nine-ten Laminectomy with extension of fusion from Thoracic ten to Thoracic six with segmental fixation (cemented) with pedicle screw and mazor;  Surgeon: Kristeen Miss, MD;  Location: [place];  Service: Neurosurgery;  Laterality: N/A;   OPEN REDUCTION INTERNAL FIXATION (ORIF) PROXIMAL PHALANX Left 08/30/2013   Procedure: OPEN REDUCTION INTERNAL FIXATION (ORIF) PROXIMAL PHALANX FRACTURE LEFT SMALL FINGER; SPLINT RING FINGER;  Surgeon: Wynonia Sours, MD;  Location: [place];  Service: Orthopedics;  Laterality: Left;   POSTERIOR LUMBAR FUSION 4 LEVEL N/A 07/17/2017   Procedure: Thoracic Ten -  Lumbar Five revison of hardware with Mazor;  Surgeon: Kristeen Miss, MD;  Location: [place];  Service: Neurosurgery;  Laterality: N/A;  Thoracic/Lumbar   PTOSIS REPAIR Bilateral 07/27/2015   Procedure: PTOSIS REPAIR;  Surgeon: Cristine Polio, MD;  Location: [place];  Service: Plastics;  Laterality: Bilateral;   SHOULDER SURGERY     rt rcr,and lt   TONSILLECTOMY     TOTAL KNEE ARTHROPLASTY Right 12/23/2019   Procedure: TOTAL KNEE ARTHROPLASTY;  Surgeon: Vickey Huger, MD;  [facility];  Service: Orthopedics;  Laterality: Right;    There were no vitals filed for this visit.   Subjective Assessment - 01/21/21 1753     Subjective Patient reports that she was having pain significant in the side last night and went to the ED, she got out this morning, CT of the abdomen and back and everything was ine, she does report blood in the urine and UTI    Currently in Pain? Yes    Pain Score 5     Pain Location Back    Pain Orientation Left;Lower    Pain Descriptors / Indicators Shooting    Aggravating Factors  UTI, ED visit                               [place] Adult PT Treatment/Exercise - 01/21/21 0001       Knee/Hip Exercises: Aerobic   Nustep level 3 x 7 minutes      Knee/Hip Exercises: Seated   Long Arc Quad Both;10 reps;3 sets    [employer] Weight 2 lbs.    Ball Squeeze 20    Other Seated Knee/Hip Exercises right ankle DF, red tband for PF and eversion all 3x10    Other Seated Knee/Hip Exercises fitter single blue band both legs 2x10    Marching Right;3 sets;10 reps      Electrical Stimulation   Electrical Stimulation Location left lumbar area    Electrical Stimulation Action IFC    Electrical Stimulation Parameters sitting    Electrical Stimulation Goals Pain                       PT Short Term Goals - 01/21/21 1833       PT SHORT TERM GOAL #1   Title independent with initial HEP    Status On-going               PT Long Term Goals - 01/06/21 1826       PT LONG TERM GOAL #1   Title decrease pain 50%    Time 12    Period Weeks    Status New      PT LONG TERM GOAL #2   Title increase lumbar ROM 25% with less pain    Time 12    Period Weeks    Status New      PT LONG TERM GOAL #3   Title increase LE strength to 4+/5    Time 12    Period Weeks    Status New      PT LONG TERM GOAL #4   Title go up and down stairs step over step    Time 12    Period Weeks    Status New      PT LONG TERM GOAL  #5   Title decrease TUG to 18 seconds    Time 12  Period Weeks    Status New                   Plan - 01/21/21 1828     Clinical Impression Statement Patient reports that she went to the ED yesterday evening and stayed until this AM, she reports that she had left back/flank pain, reports that they did CT of the abdomen and it was negative, had blood in her urine and a UTI.  She is still hurting in the back, she was able to do the exercises without much increase of pain    PT Next Visit Plan pain is the biggest issue now, will need to try to progress for functional locomotion    Consulted and Agree with Plan of Care Patient             Patient will benefit from skilled therapeutic intervention in order to improve the following deficits and impairments:  Abnormal gait, Decreased range of motion, Decreased balance, Decreased scar mobility, Impaired flexibility, Decreased strength, Decreased mobility, Decreased endurance, Pain, Cardiopulmonary status limiting activity, Decreased activity tolerance, Postural dysfunction, Improper body mechanics, Increased muscle spasms, Difficulty walking  Visit Diagnosis: Acute bilateral low back pain without sciatica  Muscle spasm of back  Difficulty in walking, not elsewhere classified  Muscle weakness (generalized)  Pain in thoracic spine     Problem List Patient Active Problem List   Diagnosis Date Noted   Myelopathy concurrent with and due to spinal stenosis of thoracic region ([place]) 10/27/2020   S/P total knee arthroplasty, right 12/23/2019   Acalculous cholecystitis 08/24/2018   Osteoarthritis of right knee    Vertigo    DDD (degenerative disc disease), cervical    Benign essential HTN    History of DVT (deep vein thrombosis)    Tachycardia    Steroid-induced hyperglycemia    Hypothyroidism    Neuropathic pain    Acute blood loss anemia    S/P lumbar fusion    Closed L5 vertebral fracture (HCC) 08/31/2017   Closed  fracture of fifth lumbar vertebra ([place]) 08/30/2017   Lumbar vertebral fracture ([place]) 07/17/2017   Scoliosis 06/13/2017    Sumner Boast, PT 01/21/2021, 6:33 PM  [address]. [place], [place], [address] Phone: [phone]   Fax:  [phone]  Name: [name] MRN: [id number] Date of Birth: 10/25/1932

## 2021-01-21 NOTE — Discharge Instructions (Addendum)
Thank you for allowing me to care for you today in the Emergency Department.   Take 1 dose of Macrobid 2 times daily for the next 5 days.  Your first dose was given in the ER.  You did have some gas noted on your CAT scan.  Simethicone is available over-the-counter.  You can take as directed on the label.  You also had some mild constipation.  Consider adding 1 dose of MiraLAX daily to your diet.  Return to the emergency department if you become unable to urinate, if you start having fever, temperature of 100.4 F or higher, he developed confusion, uncontrollable vomiting, or other new, concerning symptoms.

## 2021-01-23 LAB — URINE CULTURE: Culture: >=100000 — AB

## 2021-01-24 ENCOUNTER — Telehealth: Payer: Self-pay | Admitting: Emergency Medicine

## 2021-01-24 NOTE — Telephone Encounter (Signed)
Post ED Visit - Positive Culture Follow-up  Culture report reviewed by antimicrobial stewardship pharmacist: [place] Team []  Elenor Quinones, Pharm.D. []  Heide Guile, Pharm.D., BCPS AQ-ID []  Parks Neptune, Pharm.D., BCPS []  Alycia Rossetti, Pharm.D., BCPS []  [place], [place].D., BCPS, AAHIVP []  Legrand Como, Pharm.D., BCPS, AAHIVP []  Salome Arnt, PharmD, BCPS []  Johnnette Gourd, PharmD, BCPS []  Hughes Better, PharmD, BCPS [x]  Lorelei Pont, PharmD []  Laqueta Linden, PharmD, BCPS []  Albertina Parr, PharmD  [place] Team []  Leodis Sias, PharmD []  Lindell Spar, PharmD []  Royetta Asal, PharmD []  Graylin Shiver, Rph []  Rema Fendt) Glennon Mac, PharmD []  Arlyn Dunning, PharmD []  Netta Cedars, PharmD []  Dia Sitter, PharmD []  Leone Haven, PharmD []  Gretta Arab, PharmD []  Theodis Shove, PharmD []  Peggyann Juba, PharmD []  Reuel Boom, PharmD   Positive urine culture Treated with Nitrofuratnoin, organism sensitive to the same and no further patient follow-up is required at this time.  Sandi Raveling Etoy Mcdonnell 01/24/2021, 5:07 PM

## 2021-01-28 ENCOUNTER — Encounter: Payer: Self-pay | Admitting: Physical Therapy

## 2021-01-28 ENCOUNTER — Ambulatory Visit: Payer: Medicare Other | Admitting: Physical Therapy

## 2021-01-28 ENCOUNTER — Other Ambulatory Visit: Payer: Self-pay

## 2021-01-28 DIAGNOSIS — M62830 Muscle spasm of back: Secondary | ICD-10-CM

## 2021-01-28 DIAGNOSIS — M545 Low back pain, unspecified: Secondary | ICD-10-CM | POA: Diagnosis not present

## 2021-01-28 DIAGNOSIS — R262 Difficulty in walking, not elsewhere classified: Secondary | ICD-10-CM

## 2021-01-28 DIAGNOSIS — M6281 Muscle weakness (generalized): Secondary | ICD-10-CM

## 2021-01-28 DIAGNOSIS — M546 Pain in thoracic spine: Secondary | ICD-10-CM

## 2021-01-28 NOTE — Therapy (Signed)
[address]. [place], [place], [address] Phone: [phone]   Fax:  [phone]  Physical Therapy Treatment  Patient Details  Name: [name] MRN: [id number] Date of Birth: [birth year] Referring Provider (PT): Elsner   Encounter Date: 01/28/2021   PT End of Session - 01/28/21 1831     Visit Number 4    Date for PT Re-Evaluation 04/08/21    PT Start Time 1751    PT Stop Time 1840    PT Time Calculation (min) 49 min    Activity Tolerance Patient tolerated treatment well    Behavior During Therapy [facility] for tasks assessed/performed             Past Medical History:  Diagnosis Date   Acalculous cholecystitis 08/24/2018   Arthritis    DVT of lower extremity (deep venous thrombosis) (HCC)    right leg - after back surgery in 2019   GERD (gastroesophageal reflux disease)    History of blood transfusion    Hypothyroidism    PONV (postoperative nausea and vomiting)    Ptosis of both eyelids    Vertigo    Wears glasses    reading    Past Surgical History:  Procedure Laterality Date   ABDOMINAL HYSTERECTOMY  7124   APPLICATION OF ROBOTIC ASSISTANCE FOR SPINAL PROCEDURE N/A 07/17/2017   Procedure: APPLICATION OF ROBOTIC ASSISTANCE FOR SPINAL PROCEDURE;  Surgeon: Kristeen Miss, MD;  Location: [place];  Service: Neurosurgery;  Laterality: N/A;   APPLICATION OF ROBOTIC ASSISTANCE FOR SPINAL PROCEDURE N/A 09/01/2017   Procedure: APPLICATION OF ROBOTIC ASSISTANCE FOR SPINAL PROCEDURE;  Surgeon: Kristeen Miss, MD;  Location: [place];  Service: Neurosurgery;  Laterality: N/A;   APPLICATION OF ROBOTIC ASSISTANCE FOR SPINAL PROCEDURE N/A 10/27/2020   Procedure: APPLICATION OF ROBOTIC ASSISTANCE FOR SPINAL PROCEDURE;  Surgeon: Kristeen Miss, MD;  Location: [place];  Service: Neurosurgery;  Laterality: N/A;   BACK SURGERY  1986   lumb lam   CARPAL TUNNEL RELEASE  2000   right   CARPAL TUNNEL RELEASE  2012   left    CHOLECYSTECTOMY N/A 08/24/2018   Procedure: LAPAROSCOPIC CHOLECYSTECTOMY WITH INTRAOPERATIVE CHOLANGIOGRAM;  Surgeon: Fanny Skates, MD;  Location: [facility] OR;  Service: General;  Laterality: N/A;   COLONOSCOPY     CYSTOCELE REPAIR  2007   sling   DISTAL INTERPHALANGEAL JOINT FUSION Right 02/04/2014   Procedure: FUSION DISTAL INTERPHALANGEAL JOINT RIGHT INDEX FINGER ;  Surgeon: Daryll Brod, MD;  Location: [facility];  Service: Orthopedics;  Laterality: Right;   EYE SURGERY Bilateral    Cataract surgery with lens implant   KNEE SURGERY  2009,2011   partial knee   LAPAROSCOPY N/A 08/24/2018   Procedure: LAPAROSCOPY DIAGNOSTIC;  Surgeon: Fanny Skates, MD;  Location: [facility];  Service: General;  Laterality: N/A;   LUMBAR PERCUTANEOUS PEDICLE SCREW 4 LEVEL N/A 10/27/2020   Procedure: Thoracic nine-ten Laminectomy with extension of fusion from Thoracic ten to Thoracic six with segmental fixation (cemented) with pedicle screw and mazor;  Surgeon: Kristeen Miss, MD;  Location: [place];  Service: Neurosurgery;  Laterality: N/A;   OPEN REDUCTION INTERNAL FIXATION (ORIF) PROXIMAL PHALANX Left 08/30/2013   Procedure: OPEN REDUCTION INTERNAL FIXATION (ORIF) PROXIMAL PHALANX FRACTURE LEFT SMALL FINGER; SPLINT RING FINGER;  Surgeon: Wynonia Sours, MD;  Location: [place];  Service: Orthopedics;  Laterality: Left;   POSTERIOR LUMBAR FUSION 4 LEVEL N/A 07/17/2017   Procedure: Thoracic Ten -  Lumbar Five revison of hardware with Mazor;  Surgeon: Kristeen Miss, MD;  Location: [place];  Service: Neurosurgery;  Laterality: N/A;  Thoracic/Lumbar   PTOSIS REPAIR Bilateral 07/27/2015   Procedure: PTOSIS REPAIR;  Surgeon: Cristine Polio, MD;  Location: [place];  Service: Plastics;  Laterality: Bilateral;   SHOULDER SURGERY     rt rcr,and lt   TONSILLECTOMY     TOTAL KNEE ARTHROPLASTY Right 12/23/2019   Procedure: TOTAL KNEE ARTHROPLASTY;  Surgeon: Vickey Huger, MD;  [facility];  Service: Orthopedics;  Laterality: Right;    There were no vitals filed for this visit.   Subjective Assessment - 01/28/21 1754     Subjective Patient reports that she has been doing okay, reports that yesterday pain came back with the sharpness that she was having, She sees the MD on Monday about the kidney and UTI to see if it is cleared up    Currently in Pain? Yes    Pain Score 5     Pain Location Back    Pain Orientation Left    Pain Descriptors / Indicators Sharp;Shooting                               [place] Adult PT Treatment/Exercise - 01/28/21 0001       Ambulation/Gait   Gait Comments stairs step over step 4" and 6", walking wtih 2 SPC's and CGA 3x100      Knee/Hip Exercises: Aerobic   Nustep level 4 x 8 minutes      Knee/Hip Exercises: Standing   Heel Raises Both;2 sets;10 reps    Hip Abduction Right;3 sets;10 reps    Abduction Limitations 2    Other Standing Knee Exercises tried toe raises due to weakness had to really limit compensation at the hips      Knee/Hip Exercises: Seated   Long Arc Quad Both;10 reps;3 sets    [employer] Weight 2 lbs.    Ball Squeeze 20    Other Seated Knee/Hip Exercises right ankle DF, red tband for PF and eversion all 3x10, Hamstring curls red tband    Other Seated Knee/Hip Exercises fitter single blue band both legs 2x10    Marching Right;3 sets;10 reps                       PT Short Term Goals - 01/28/21 1846       PT SHORT TERM GOAL #1   Title independent with initial HEP    Status Partially Met               PT Long Term Goals - 01/06/21 1826       PT LONG TERM GOAL #1   Title decrease pain 50%    Time 12    Period Weeks    Status New      PT LONG TERM GOAL #2   Title increase lumbar ROM 25% with less pain    Time 12    Period Weeks    Status New      PT LONG TERM GOAL #3   Title increase LE strength to 4+/5    Time 12    Period Weeks    Status New      PT  LONG TERM GOAL #4   Title go up and down stairs step over step    Time 12    Period  Weeks    Status New      PT LONG TERM GOAL #5   Title decrease TUG to 18 seconds    Time 12    Period Weeks    Status New                   Plan - 01/28/21 1844     Clinical Impression Statement Patient continues to have some pain in the back and the ribs.  She reports that after we exercise she "feels better"  I had her try the stairs step over step, 4" is easy, struggles some with tightness and weakness in the knees on the 6".  I also had her walk with two SPC's, she did very well, good posture and good step length and speed.  SBA/CGA needed for this    PT Next Visit Plan pain is the biggest issue now, will need to try to progress for functional locomotion    Consulted and Agree with Plan of Care Patient             Patient will benefit from skilled therapeutic intervention in order to improve the following deficits and impairments:  Abnormal gait, Decreased range of motion, Decreased balance, Decreased scar mobility, Impaired flexibility, Decreased strength, Decreased mobility, Decreased endurance, Pain, Cardiopulmonary status limiting activity, Decreased activity tolerance, Postural dysfunction, Improper body mechanics, Increased muscle spasms, Difficulty walking  Visit Diagnosis: Acute bilateral low back pain without sciatica  Muscle spasm of back  Difficulty in walking, not elsewhere classified  Muscle weakness (generalized)  Pain in thoracic spine     Problem List Patient Active Problem List   Diagnosis Date Noted   Myelopathy concurrent with and due to spinal stenosis of thoracic region ([place]) 10/27/2020   S/P total knee arthroplasty, right 12/23/2019   Acalculous cholecystitis 08/24/2018   Osteoarthritis of right knee    Vertigo    DDD (degenerative disc disease), cervical    Benign essential HTN    History of DVT (deep vein thrombosis)    Tachycardia     Steroid-induced hyperglycemia    Hypothyroidism    Neuropathic pain    Acute blood loss anemia    S/P lumbar fusion    Closed L5 vertebral fracture (HCC) 08/31/2017   Closed fracture of fifth lumbar vertebra ([place]) 08/30/2017   Lumbar vertebral fracture ([place]) 07/17/2017   Scoliosis 06/13/2017    Sumner Boast, PT 01/28/2021, 6:47 PM  [place]. [place], [place], [address] Phone: [phone]   Fax:  [phone]  Name: NADEA KIRKLAND MRN: [id number] Date of Birth: March 31, 1933

## 2021-02-03 ENCOUNTER — Ambulatory Visit: Payer: Medicare Other | Admitting: Physical Therapy

## 2021-02-09 ENCOUNTER — Encounter: Payer: Self-pay | Admitting: Physical Therapy

## 2021-02-09 ENCOUNTER — Ambulatory Visit: Payer: Medicare Other | Attending: Neurological Surgery | Admitting: Physical Therapy

## 2021-02-09 ENCOUNTER — Other Ambulatory Visit: Payer: Self-pay | Admitting: Orthopedic Surgery

## 2021-02-09 ENCOUNTER — Other Ambulatory Visit: Payer: Self-pay

## 2021-02-09 DIAGNOSIS — R262 Difficulty in walking, not elsewhere classified: Secondary | ICD-10-CM | POA: Diagnosis present

## 2021-02-09 DIAGNOSIS — M545 Low back pain, unspecified: Secondary | ICD-10-CM | POA: Insufficient documentation

## 2021-02-09 DIAGNOSIS — M546 Pain in thoracic spine: Secondary | ICD-10-CM | POA: Diagnosis present

## 2021-02-09 DIAGNOSIS — M62830 Muscle spasm of back: Secondary | ICD-10-CM | POA: Insufficient documentation

## 2021-02-09 DIAGNOSIS — M6281 Muscle weakness (generalized): Secondary | ICD-10-CM | POA: Insufficient documentation

## 2021-02-09 DIAGNOSIS — M25561 Pain in right knee: Secondary | ICD-10-CM | POA: Insufficient documentation

## 2021-02-09 DIAGNOSIS — R2232 Localized swelling, mass and lump, left upper limb: Secondary | ICD-10-CM

## 2021-02-09 DIAGNOSIS — M65332 Trigger finger, left middle finger: Secondary | ICD-10-CM

## 2021-02-09 NOTE — Therapy (Signed)
[place]. [place], [place], [address] Phone: [phone]   Fax:  [phone]  Physical Therapy Treatment  Patient Details  Name: [name] MRN: [id number] Date of Birth: 10/18/1932 Referring Provider (PT): Elsner   Encounter Date: 02/09/2021   PT End of Session - 02/09/21 1838     Visit Number 5    Date for PT Re-Evaluation 04/08/21    PT Start Time 1754    PT Stop Time 1838    PT Time Calculation (min) 44 min    Activity Tolerance Patient tolerated treatment well    Behavior During Therapy [facility] for tasks assessed/performed             Past Medical History:  Diagnosis Date   Acalculous cholecystitis 08/24/2018   Arthritis    DVT of lower extremity (deep venous thrombosis) (HCC)    right leg - after back surgery in 2019   GERD (gastroesophageal reflux disease)    History of blood transfusion    Hypothyroidism    PONV (postoperative nausea and vomiting)    Ptosis of both eyelids    Vertigo    Wears glasses    reading    Past Surgical History:  Procedure Laterality Date   ABDOMINAL HYSTERECTOMY  6301   APPLICATION OF ROBOTIC ASSISTANCE FOR SPINAL PROCEDURE N/A 07/17/2017   Procedure: APPLICATION OF ROBOTIC ASSISTANCE FOR SPINAL PROCEDURE;  Surgeon: Kristeen Miss, MD;  Location: [place];  Service: Neurosurgery;  Laterality: N/A;   APPLICATION OF ROBOTIC ASSISTANCE FOR SPINAL PROCEDURE N/A 09/01/2017   Procedure: APPLICATION OF ROBOTIC ASSISTANCE FOR SPINAL PROCEDURE;  Surgeon: Kristeen Miss, MD;  Location: [place];  Service: Neurosurgery;  Laterality: N/A;   APPLICATION OF ROBOTIC ASSISTANCE FOR SPINAL PROCEDURE N/A 10/27/2020   Procedure: APPLICATION OF ROBOTIC ASSISTANCE FOR SPINAL PROCEDURE;  Surgeon: Kristeen Miss, MD;  Location: [place];  Service: Neurosurgery;  Laterality: N/A;   BACK SURGERY  1986   lumb lam   CARPAL TUNNEL RELEASE  2000   right   CARPAL TUNNEL RELEASE  2012   left    CHOLECYSTECTOMY N/A 08/24/2018   Procedure: LAPAROSCOPIC CHOLECYSTECTOMY WITH INTRAOPERATIVE CHOLANGIOGRAM;  Surgeon: Fanny Skates, MD;  Location: [facility] OR;  Service: General;  Laterality: N/A;   COLONOSCOPY     CYSTOCELE REPAIR  2007   sling   DISTAL INTERPHALANGEAL JOINT FUSION Right 02/04/2014   Procedure: FUSION DISTAL INTERPHALANGEAL JOINT RIGHT INDEX FINGER ;  Surgeon: Daryll Brod, MD;  Location: [place];  Service: Orthopedics;  Laterality: Right;   EYE SURGERY Bilateral    Cataract surgery with lens implant   KNEE SURGERY  2009,2011   partial knee   LAPAROSCOPY N/A 08/24/2018   Procedure: LAPAROSCOPY DIAGNOSTIC;  Surgeon: Fanny Skates, MD;  Location: [place];  Service: General;  Laterality: N/A;   LUMBAR PERCUTANEOUS PEDICLE SCREW 4 LEVEL N/A 10/27/2020   Procedure: Thoracic nine-ten Laminectomy with extension of fusion from Thoracic ten to Thoracic six with segmental fixation (cemented) with pedicle screw and mazor;  Surgeon: Kristeen Miss, MD;  Location: [place];  Service: Neurosurgery;  Laterality: N/A;   OPEN REDUCTION INTERNAL FIXATION (ORIF) PROXIMAL PHALANX Left 08/30/2013   Procedure: OPEN REDUCTION INTERNAL FIXATION (ORIF) PROXIMAL PHALANX FRACTURE LEFT SMALL FINGER; SPLINT RING FINGER;  Surgeon: Wynonia Sours, MD;  Location: [place];  Service: Orthopedics;  Laterality: Left;   POSTERIOR LUMBAR FUSION 4 LEVEL N/A 07/17/2017   Procedure: Thoracic Ten -  Lumbar Five revison of hardware with Mazor;  Surgeon: Kristeen Miss, MD;  Location: [place];  Service: Neurosurgery;  Laterality: N/A;  Thoracic/Lumbar   PTOSIS REPAIR Bilateral 07/27/2015   Procedure: PTOSIS REPAIR;  Surgeon: Cristine Polio, MD;  Location: [place];  Service: Plastics;  Laterality: Bilateral;   SHOULDER SURGERY     rt rcr,and lt   TONSILLECTOMY     TOTAL KNEE ARTHROPLASTY Right 12/23/2019   Procedure: TOTAL KNEE ARTHROPLASTY;  Surgeon: Vickey Huger, MD;  [facility];  Service: Orthopedics;  Laterality: Right;    There were no vitals filed for this visit.   Subjective Assessment - 02/09/21 1755     Subjective Patient reports that she was finally diagnosed with costochondritis.  She reports still really hurting in the left rib flank area.  No falls    Currently in Pain? Yes    Pain Score 3     Pain Location Rib cage    Pain Orientation Left;Anterior    Pain Descriptors / Indicators Sharp    Aggravating Factors  the rbs still hurt                               [facility] Adult PT Treatment/Exercise - 02/09/21 0001       Ambulation/Gait   Gait Comments stairs step over step 4" and 6", walking wtih 2 SPC's and CGA 3x100, tried walking with a SPC      Knee/Hip Exercises: Aerobic   Nustep level 4 x 8 minutes      Knee/Hip Exercises: Standing   Heel Raises Both;2 sets;10 reps    Hip Abduction Right;3 sets;10 reps    Abduction Limitations 2      Knee/Hip Exercises: Seated   Long Arc Quad Both;10 reps;3 sets    Long Arc Quad Weight 2 lbs.    Ball Squeeze 20    Other Seated Knee/Hip Exercises right ankle DF, red tband for PF and eversion all 3x10, Hamstring curls red tband    Other Seated Knee/Hip Exercises fitter 2 blue bands both legs 2x10    Marching Right;3 sets;10 reps                       PT Short Term Goals - 01/28/21 1846       PT SHORT TERM GOAL #1   Title independent with initial HEP    Status Partially Met               PT Long Term Goals - 01/06/21 1826       PT LONG TERM GOAL #1   Title decrease pain 50%    Time 12    Period Weeks    Status New      PT LONG TERM GOAL #2   Title increase lumbar ROM 25% with less pain    Time 12    Period Weeks    Status New      PT LONG TERM GOAL #3   Title increase LE strength to 4+/5    Time 12    Period Weeks    Status New      PT LONG TERM GOAL #4   Title go up and down stairs step over step    Time 12    Period Weeks    Status  New      PT LONG TERM GOAL #5   Title decrease  TUG to 18 seconds    Time 12    Period Weeks    Status New                   Plan - 02/09/21 1839     Clinical Impression Statement Patient was diagnosed with costochondritis, she is doing better but still very painful in the rib and the back area.  I am working on strength and function, had her do more walking today with a SPC, she does well for about 50 feet and then starts to fatigue, once she fatigues she starts to wobble, with two canes she does very well    PT Next Visit Plan start to progress the strength and function    Consulted and Agree with Plan of Care Patient             Patient will benefit from skilled therapeutic intervention in order to improve the following deficits and impairments:  Abnormal gait, Decreased range of motion, Decreased balance, Decreased scar mobility, Impaired flexibility, Decreased strength, Decreased mobility, Decreased endurance, Pain, Cardiopulmonary status limiting activity, Decreased activity tolerance, Postural dysfunction, Improper body mechanics, Increased muscle spasms, Difficulty walking  Visit Diagnosis: Acute bilateral low back pain without sciatica  Muscle spasm of back  Difficulty in walking, not elsewhere classified  Muscle weakness (generalized)  Pain in thoracic spine     Problem List Patient Active Problem List   Diagnosis Date Noted   Myelopathy concurrent with and due to spinal stenosis of thoracic region ([place]) 10/27/2020   S/P total knee arthroplasty, right 12/23/2019   Acalculous cholecystitis 08/24/2018   Osteoarthritis of right knee    Vertigo    DDD (degenerative disc disease), cervical    Benign essential HTN    History of DVT (deep vein thrombosis)    Tachycardia    Steroid-induced hyperglycemia    Hypothyroidism    Neuropathic pain    Acute blood loss anemia    S/P lumbar fusion    Closed L5 vertebral fracture (HCC) 08/31/2017   Closed  fracture of fifth lumbar vertebra ([place]) 08/30/2017   Lumbar vertebral fracture ([place]) 07/17/2017   Scoliosis 06/13/2017    Sumner Boast, PT 02/09/2021, 6:41 PM  [place]. [place], [place], [address] Phone: [phone]   Fax:  [phone]  Name: [name] MRN: [id number] Date of Birth: 1933/01/29

## 2021-02-11 ENCOUNTER — Other Ambulatory Visit: Payer: Self-pay

## 2021-02-11 ENCOUNTER — Ambulatory Visit: Payer: Medicare Other | Admitting: Physical Therapy

## 2021-02-11 ENCOUNTER — Encounter: Payer: Self-pay | Admitting: Physical Therapy

## 2021-02-11 DIAGNOSIS — M545 Low back pain, unspecified: Secondary | ICD-10-CM

## 2021-02-11 DIAGNOSIS — M6281 Muscle weakness (generalized): Secondary | ICD-10-CM

## 2021-02-11 DIAGNOSIS — M546 Pain in thoracic spine: Secondary | ICD-10-CM

## 2021-02-11 DIAGNOSIS — M62830 Muscle spasm of back: Secondary | ICD-10-CM

## 2021-02-11 DIAGNOSIS — R262 Difficulty in walking, not elsewhere classified: Secondary | ICD-10-CM

## 2021-02-11 NOTE — Therapy (Signed)
[place]. [place], [place], [address] Phone: [phone]   Fax:  [phone]  Physical Therapy Treatment  Patient Details  Name: [name] MRN: [id number] Date of Birth: 11-08-1932 Referring Provider (PT): Elsner   Encounter Date: 02/11/2021   PT End of Session - 02/11/21 1751     Visit Number 6    Date for PT Re-Evaluation 04/08/21    PT Start Time 1706    PT Stop Time 1752    PT Time Calculation (min) 46 min    Activity Tolerance Patient tolerated treatment well    Behavior During Therapy [facility] for tasks assessed/performed             Past Medical History:  Diagnosis Date   Acalculous cholecystitis 08/24/2018   Arthritis    DVT of lower extremity (deep venous thrombosis) (HCC)    right leg - after back surgery in 2019   GERD (gastroesophageal reflux disease)    History of blood transfusion    Hypothyroidism    PONV (postoperative nausea and vomiting)    Ptosis of both eyelids    Vertigo    Wears glasses    reading    Past Surgical History:  Procedure Laterality Date   ABDOMINAL HYSTERECTOMY  1443   APPLICATION OF ROBOTIC ASSISTANCE FOR SPINAL PROCEDURE N/A 07/17/2017   Procedure: APPLICATION OF ROBOTIC ASSISTANCE FOR SPINAL PROCEDURE;  Surgeon: Kristeen Miss, MD;  Location: [place];  Service: Neurosurgery;  Laterality: N/A;   APPLICATION OF ROBOTIC ASSISTANCE FOR SPINAL PROCEDURE N/A 09/01/2017   Procedure: APPLICATION OF ROBOTIC ASSISTANCE FOR SPINAL PROCEDURE;  Surgeon: Kristeen Miss, MD;  Location: [place];  Service: Neurosurgery;  Laterality: N/A;   APPLICATION OF ROBOTIC ASSISTANCE FOR SPINAL PROCEDURE N/A 10/27/2020   Procedure: APPLICATION OF ROBOTIC ASSISTANCE FOR SPINAL PROCEDURE;  Surgeon: Kristeen Miss, MD;  Location: [place];  Service: Neurosurgery;  Laterality: N/A;   BACK SURGERY  1986   lumb lam   CARPAL TUNNEL RELEASE  2000   right   CARPAL TUNNEL RELEASE  2012   left    CHOLECYSTECTOMY N/A 08/24/2018   Procedure: LAPAROSCOPIC CHOLECYSTECTOMY WITH INTRAOPERATIVE CHOLANGIOGRAM;  Surgeon: Fanny Skates, MD;  Location: [facility] OR;  Service: General;  Laterality: N/A;   COLONOSCOPY     CYSTOCELE REPAIR  2007   sling   DISTAL INTERPHALANGEAL JOINT FUSION Right 02/04/2014   Procedure: FUSION DISTAL INTERPHALANGEAL JOINT RIGHT INDEX FINGER ;  Surgeon: Daryll Brod, MD;  Location: [place];  Service: Orthopedics;  Laterality: Right;   EYE SURGERY Bilateral    Cataract surgery with lens implant   KNEE SURGERY  2009,2011   partial knee   LAPAROSCOPY N/A 08/24/2018   Procedure: LAPAROSCOPY DIAGNOSTIC;  Surgeon: Fanny Skates, MD;  Location: [place];  Service: General;  Laterality: N/A;   LUMBAR PERCUTANEOUS PEDICLE SCREW 4 LEVEL N/A 10/27/2020   Procedure: Thoracic nine-ten Laminectomy with extension of fusion from Thoracic ten to Thoracic six with segmental fixation (cemented) with pedicle screw and mazor;  Surgeon: Kristeen Miss, MD;  Location: [place];  Service: Neurosurgery;  Laterality: N/A;   OPEN REDUCTION INTERNAL FIXATION (ORIF) PROXIMAL PHALANX Left 08/30/2013   Procedure: OPEN REDUCTION INTERNAL FIXATION (ORIF) PROXIMAL PHALANX FRACTURE LEFT SMALL FINGER; SPLINT RING FINGER;  Surgeon: Wynonia Sours, MD;  Location: [place];  Service: Orthopedics;  Laterality: Left;   POSTERIOR LUMBAR FUSION 4 LEVEL N/A 07/17/2017   Procedure: Thoracic Ten -  Lumbar Five revison of hardware with Mazor;  Surgeon: Kristeen Miss, MD;  Location: [place];  Service: Neurosurgery;  Laterality: N/A;  Thoracic/Lumbar   PTOSIS REPAIR Bilateral 07/27/2015   Procedure: PTOSIS REPAIR;  Surgeon: Cristine Polio, MD;  Location: [place];  Service: Plastics;  Laterality: Bilateral;   SHOULDER SURGERY     rt rcr,and lt   TONSILLECTOMY     TOTAL KNEE ARTHROPLASTY Right 12/23/2019   Procedure: TOTAL KNEE ARTHROPLASTY;  Surgeon: Vickey Huger, MD;  [facility];  Service: Orthopedics;  Laterality: Right;    There were no vitals filed for this visit.   Subjective Assessment - 02/11/21 1710     Subjective I am really hurting today, we had some running around to do.    Currently in Pain? Yes    Pain Score 4     Pain Location Back    Aggravating Factors  just doing                               [facility] Adult PT Treatment/Exercise - 02/11/21 0001       Ambulation/Gait   Gait Comments 200 feet with 2SPC's, 100' with 2 SPC and 100 feet with a SPC and SBA      Knee/Hip Exercises: Aerobic   Recumbent Bike level 1 x 5 minutes    Nustep level 5 x 6 minutes      Knee/Hip Exercises: Standing   Heel Raises Both;2 sets;10 reps    Hip Abduction Right;3 sets;10 reps    Abduction Limitations 2      Knee/Hip Exercises: Seated   Long Arc Quad Both;10 reps;3 sets    Long Arc Quad Weight 2 lbs.    Ball Squeeze 20    Other Seated Knee/Hip Exercises right ankle DF, red tband for PF and eversion all 3x10, Hamstring curls red tband    Marching Right;3 sets;10 reps                       PT Short Term Goals - 01/28/21 1846       PT SHORT TERM GOAL #1   Title independent with initial HEP    Status Partially Met               PT Long Term Goals - 02/11/21 1755       PT LONG TERM GOAL #1   Title decrease pain 50%    Status On-going      PT LONG TERM GOAL #2   Title increase lumbar ROM 25% with less pain    Status On-going      PT LONG TERM GOAL #3   Title increase LE strength to 4+/5    Status On-going      PT LONG TERM GOAL #4   Title go up and down stairs step over step    Status On-going      PT LONG TERM GOAL #5   Title decrease TUG to 18 seconds    Status On-going                   Plan - 02/11/21 1752     Clinical Impression Statement Patient with  alittle more back pain today, we continued the strength and walking, working on gait with 2 SPC's and trying a S:C, she is doing  well less "wobble", she still gets the low back fatigue.  Right ankle is weak but moving better this go round from previous time due to the nerve damage    PT Next Visit Plan start to progress the strength and function    Consulted and Agree with Plan of Care Patient             Patient will benefit from skilled therapeutic intervention in order to improve the following deficits and impairments:  Abnormal gait, Decreased range of motion, Decreased balance, Decreased scar mobility, Impaired flexibility, Decreased strength, Decreased mobility, Decreased endurance, Pain, Cardiopulmonary status limiting activity, Decreased activity tolerance, Postural dysfunction, Improper body mechanics, Increased muscle spasms, Difficulty walking  Visit Diagnosis: Acute bilateral low back pain without sciatica  Muscle spasm of back  Difficulty in walking, not elsewhere classified  Muscle weakness (generalized)  Pain in thoracic spine     Problem List Patient Active Problem List   Diagnosis Date Noted   Myelopathy concurrent with and due to spinal stenosis of thoracic region ([place]) 10/27/2020   S/P total knee arthroplasty, right 12/23/2019   Acalculous cholecystitis 08/24/2018   Osteoarthritis of right knee    Vertigo    DDD (degenerative disc disease), cervical    Benign essential HTN    History of DVT (deep vein thrombosis)    Tachycardia    Steroid-induced hyperglycemia    Hypothyroidism    Neuropathic pain    Acute blood loss anemia    S/P lumbar fusion    Closed L5 vertebral fracture (HCC) 08/31/2017   Closed fracture of fifth lumbar vertebra ([place]) 08/30/2017   Lumbar vertebral fracture ([place]) 07/17/2017   Scoliosis 06/13/2017    Sumner Boast, PT 02/11/2021, 5:56 PM  [place]. [place], [place], [address] Phone: [phone]   Fax:  [phone]  Name: [name] MRN: [id number] Date of Birth:  [birth year]

## 2021-02-16 ENCOUNTER — Encounter: Payer: Self-pay | Admitting: Physical Therapy

## 2021-02-16 ENCOUNTER — Other Ambulatory Visit: Payer: Self-pay

## 2021-02-16 ENCOUNTER — Ambulatory Visit: Payer: Medicare Other | Admitting: Physical Therapy

## 2021-02-16 DIAGNOSIS — M545 Low back pain, unspecified: Secondary | ICD-10-CM

## 2021-02-16 DIAGNOSIS — R262 Difficulty in walking, not elsewhere classified: Secondary | ICD-10-CM

## 2021-02-16 DIAGNOSIS — M546 Pain in thoracic spine: Secondary | ICD-10-CM

## 2021-02-16 DIAGNOSIS — M62830 Muscle spasm of back: Secondary | ICD-10-CM

## 2021-02-16 DIAGNOSIS — M6281 Muscle weakness (generalized): Secondary | ICD-10-CM

## 2021-02-16 NOTE — Therapy (Signed)
[place]. [place], [place], [address] Phone: [phone]   Fax:  [phone]  Physical Therapy Treatment  Patient Details  Name: [name] MRN: [id number] Date of Birth: [birth year] Referring Provider (PT): Elsner   Encounter Date: 02/16/2021   PT End of Session - 02/16/21 1839     Visit Number 7    Date for PT Re-Evaluation 04/08/21    PT Start Time 4650    PT Stop Time 1840    PT Time Calculation (min) 44 min    Activity Tolerance Patient tolerated treatment well    Behavior During Therapy [facility] for tasks assessed/performed             Past Medical History:  Diagnosis Date   Acalculous cholecystitis 08/24/2018   Arthritis    DVT of lower extremity (deep venous thrombosis) (HCC)    right leg - after back surgery in 2019   GERD (gastroesophageal reflux disease)    History of blood transfusion    Hypothyroidism    PONV (postoperative nausea and vomiting)    Ptosis of both eyelids    Vertigo    Wears glasses    reading    Past Surgical History:  Procedure Laterality Date   ABDOMINAL HYSTERECTOMY  3546   APPLICATION OF ROBOTIC ASSISTANCE FOR SPINAL PROCEDURE N/A 07/17/2017   Procedure: APPLICATION OF ROBOTIC ASSISTANCE FOR SPINAL PROCEDURE;  Surgeon: Kristeen Miss, MD;  Location: [place];  Service: Neurosurgery;  Laterality: N/A;   APPLICATION OF ROBOTIC ASSISTANCE FOR SPINAL PROCEDURE N/A 09/01/2017   Procedure: APPLICATION OF ROBOTIC ASSISTANCE FOR SPINAL PROCEDURE;  Surgeon: Kristeen Miss, MD;  Location: [place];  Service: Neurosurgery;  Laterality: N/A;   APPLICATION OF ROBOTIC ASSISTANCE FOR SPINAL PROCEDURE N/A 10/27/2020   Procedure: APPLICATION OF ROBOTIC ASSISTANCE FOR SPINAL PROCEDURE;  Surgeon: Kristeen Miss, MD;  Location: [place];  Service: Neurosurgery;  Laterality: N/A;   BACK SURGERY  1986   lumb lam   CARPAL TUNNEL RELEASE  2000   right   CARPAL TUNNEL RELEASE  2012   left    CHOLECYSTECTOMY N/A 08/24/2018   Procedure: LAPAROSCOPIC CHOLECYSTECTOMY WITH INTRAOPERATIVE CHOLANGIOGRAM;  Surgeon: Fanny Skates, MD;  Location: [facility] OR;  Service: General;  Laterality: N/A;   COLONOSCOPY     CYSTOCELE REPAIR  2007   sling   DISTAL INTERPHALANGEAL JOINT FUSION Right 02/04/2014   Procedure: FUSION DISTAL INTERPHALANGEAL JOINT RIGHT INDEX FINGER ;  Surgeon: Daryll Brod, MD;  Location: [place];  Service: Orthopedics;  Laterality: Right;   EYE SURGERY Bilateral    Cataract surgery with lens implant   KNEE SURGERY  2009,2011   partial knee   LAPAROSCOPY N/A 08/24/2018   Procedure: LAPAROSCOPY DIAGNOSTIC;  Surgeon: Fanny Skates, MD;  Location: [place];  Service: General;  Laterality: N/A;   LUMBAR PERCUTANEOUS PEDICLE SCREW 4 LEVEL N/A 10/27/2020   Procedure: Thoracic nine-ten Laminectomy with extension of fusion from Thoracic ten to Thoracic six with segmental fixation (cemented) with pedicle screw and mazor;  Surgeon: Kristeen Miss, MD;  Location: [place];  Service: Neurosurgery;  Laterality: N/A;   OPEN REDUCTION INTERNAL FIXATION (ORIF) PROXIMAL PHALANX Left 08/30/2013   Procedure: OPEN REDUCTION INTERNAL FIXATION (ORIF) PROXIMAL PHALANX FRACTURE LEFT SMALL FINGER; SPLINT RING FINGER;  Surgeon: Wynonia Sours, MD;  Location: [place];  Service: Orthopedics;  Laterality: Left;   POSTERIOR LUMBAR FUSION 4 LEVEL N/A 07/17/2017   Procedure: Thoracic Ten -  Lumbar Five revison of hardware with Mazor;  Surgeon: Kristeen Miss, MD;  Location: [place];  Service: Neurosurgery;  Laterality: N/A;  Thoracic/Lumbar   PTOSIS REPAIR Bilateral 07/27/2015   Procedure: PTOSIS REPAIR;  Surgeon: Cristine Polio, MD;  Location: [place];  Service: Plastics;  Laterality: Bilateral;   SHOULDER SURGERY     rt rcr,and lt   TONSILLECTOMY     TOTAL KNEE ARTHROPLASTY Right 12/23/2019   Procedure: TOTAL KNEE ARTHROPLASTY;  Surgeon: Vickey Huger, MD;  [facility];  Service: Orthopedics;  Laterality: Right;    There were no vitals filed for this visit.   Subjective Assessment - 02/16/21 1804     Subjective Getting better but still hurting    Currently in Pain? Yes    Pain Score 4     Pain Location Back    Pain Orientation Left;Anterior    Pain Descriptors / Indicators Sharp;Aching    Aggravating Factors  just hurts                               [facility] Adult PT Treatment/Exercise - 02/16/21 0001       Ambulation/Gait   Gait Comments 200 feet  with SPC and HHA, 100 feet with SPC and SBA      Knee/Hip Exercises: Aerobic   Recumbent Bike level 2 x 5 minutes    Nustep level 5 x 5 minutes      Knee/Hip Exercises: Machines for Strengthening   Cybex Knee Extension 5# 2x10 then right only 5x    Cybex Knee Flexion 20# 2x10 , then right only 15# 3x5    Other Machine leg press 20# 2x10. 40# x10, then right only 20# small ROM 2x5      Knee/Hip Exercises: Standing   Heel Raises Both;2 sets;10 reps    Hip Abduction Right;3 sets;10 reps    Abduction Limitations 2    Hip Extension Right;3 sets;10 reps    Extension Limitations 2      Knee/Hip Exercises: Seated   Other Seated Knee/Hip Exercises right ankle DF, red tband for PF and eversion all 3x10, Hamstring curls red tband    Marching Right;3 sets;10 reps                       PT Short Term Goals - 01/28/21 1846       PT SHORT TERM GOAL #1   Title independent with initial HEP    Status Partially Met               PT Long Term Goals - 02/16/21 1841       PT LONG TERM GOAL #1   Title decrease pain 50%    Status On-going      PT LONG TERM GOAL #2   Title increase lumbar ROM 25% with less pain    Status On-going      PT LONG TERM GOAL #3   Title increase LE strength to 4+/5    Status On-going                   Plan - 02/16/21 1839     Clinical Impression Statement I added more machines to focus on LE strength specifically the  right LE, she did well, fatigued and is weak but no increase of pain, with the [facility] she is doing better with less fatigue and back pain  PT Next Visit Plan see how the machines did with her pain levels    Consulted and Agree with Plan of Care Patient             Patient will benefit from skilled therapeutic intervention in order to improve the following deficits and impairments:  Abnormal gait, Decreased range of motion, Decreased balance, Decreased scar mobility, Impaired flexibility, Decreased strength, Decreased mobility, Decreased endurance, Pain, Cardiopulmonary status limiting activity, Decreased activity tolerance, Postural dysfunction, Improper body mechanics, Increased muscle spasms, Difficulty walking  Visit Diagnosis: Acute bilateral low back pain without sciatica  Muscle spasm of back  Difficulty in walking, not elsewhere classified  Muscle weakness (generalized)  Pain in thoracic spine     Problem List Patient Active Problem List   Diagnosis Date Noted   Myelopathy concurrent with and due to spinal stenosis of thoracic region ([place]) 10/27/2020   S/P total knee arthroplasty, right 12/23/2019   Acalculous cholecystitis 08/24/2018   Osteoarthritis of right knee    Vertigo    DDD (degenerative disc disease), cervical    Benign essential HTN    History of DVT (deep vein thrombosis)    Tachycardia    Steroid-induced hyperglycemia    Hypothyroidism    Neuropathic pain    Acute blood loss anemia    S/P lumbar fusion    Closed L5 vertebral fracture (HCC) 08/31/2017   Closed fracture of fifth lumbar vertebra ([place]) 08/30/2017   Lumbar vertebral fracture ([place]) 07/17/2017   Scoliosis 06/13/2017    Sumner Boast, PT 02/16/2021, 6:41 PM  [place]. [place], [place], [address] Phone: [phone]   Fax:  [phone]  Name: [name] MRN: [id number] Date of Birth: Mar 03, 1933

## 2021-02-18 ENCOUNTER — Encounter: Payer: Self-pay | Admitting: Physical Therapy

## 2021-02-18 ENCOUNTER — Ambulatory Visit: Payer: Medicare Other | Admitting: Physical Therapy

## 2021-02-18 ENCOUNTER — Other Ambulatory Visit: Payer: Self-pay

## 2021-02-18 DIAGNOSIS — M62830 Muscle spasm of back: Secondary | ICD-10-CM

## 2021-02-18 DIAGNOSIS — M546 Pain in thoracic spine: Secondary | ICD-10-CM

## 2021-02-18 DIAGNOSIS — M545 Low back pain, unspecified: Secondary | ICD-10-CM

## 2021-02-18 DIAGNOSIS — R262 Difficulty in walking, not elsewhere classified: Secondary | ICD-10-CM

## 2021-02-18 DIAGNOSIS — M6281 Muscle weakness (generalized): Secondary | ICD-10-CM

## 2021-02-18 NOTE — Therapy (Signed)
[place]. [place], [place], [address] Phone: [phone]   Fax:  [phone]  Physical Therapy Treatment  Patient Details  Name: [name] MRN: [id number] Date of Birth: 1932/07/27 Referring Provider (PT): Elsner   Encounter Date: 02/18/2021   PT End of Session - 02/18/21 1525     Visit Number 8    Date for PT Re-Evaluation 04/08/21    PT Start Time 1440    PT Stop Time 1524    PT Time Calculation (min) 44 min    Activity Tolerance Patient tolerated treatment well    Behavior During Therapy [facility] for tasks assessed/performed             Past Medical History:  Diagnosis Date   Acalculous cholecystitis 08/24/2018   Arthritis    DVT of lower extremity (deep venous thrombosis) (HCC)    right leg - after back surgery in 2019   GERD (gastroesophageal reflux disease)    History of blood transfusion    Hypothyroidism    PONV (postoperative nausea and vomiting)    Ptosis of both eyelids    Vertigo    Wears glasses    reading    Past Surgical History:  Procedure Laterality Date   ABDOMINAL HYSTERECTOMY  6962   APPLICATION OF ROBOTIC ASSISTANCE FOR SPINAL PROCEDURE N/A 07/17/2017   Procedure: APPLICATION OF ROBOTIC ASSISTANCE FOR SPINAL PROCEDURE;  Surgeon: Kristeen Miss, MD;  Location: [place];  Service: Neurosurgery;  Laterality: N/A;   APPLICATION OF ROBOTIC ASSISTANCE FOR SPINAL PROCEDURE N/A 09/01/2017   Procedure: APPLICATION OF ROBOTIC ASSISTANCE FOR SPINAL PROCEDURE;  Surgeon: Kristeen Miss, MD;  Location: [place];  Service: Neurosurgery;  Laterality: N/A;   APPLICATION OF ROBOTIC ASSISTANCE FOR SPINAL PROCEDURE N/A 10/27/2020   Procedure: APPLICATION OF ROBOTIC ASSISTANCE FOR SPINAL PROCEDURE;  Surgeon: Kristeen Miss, MD;  Location: [place];  Service: Neurosurgery;  Laterality: N/A;   BACK SURGERY  1986   lumb lam   CARPAL TUNNEL RELEASE  2000   right   CARPAL TUNNEL RELEASE  2012   left    CHOLECYSTECTOMY N/A 08/24/2018   Procedure: LAPAROSCOPIC CHOLECYSTECTOMY WITH INTRAOPERATIVE CHOLANGIOGRAM;  Surgeon: Fanny Skates, MD;  Location: [facility] OR;  Service: General;  Laterality: N/A;   COLONOSCOPY     CYSTOCELE REPAIR  2007   sling   DISTAL INTERPHALANGEAL JOINT FUSION Right 02/04/2014   Procedure: FUSION DISTAL INTERPHALANGEAL JOINT RIGHT INDEX FINGER ;  Surgeon: Daryll Brod, MD;  Location: [place];  Service: Orthopedics;  Laterality: Right;   EYE SURGERY Bilateral    Cataract surgery with lens implant   KNEE SURGERY  2009,2011   partial knee   LAPAROSCOPY N/A 08/24/2018   Procedure: LAPAROSCOPY DIAGNOSTIC;  Surgeon: Fanny Skates, MD;  Location: [place];  Service: General;  Laterality: N/A;   LUMBAR PERCUTANEOUS PEDICLE SCREW 4 LEVEL N/A 10/27/2020   Procedure: Thoracic nine-ten Laminectomy with extension of fusion from Thoracic ten to Thoracic six with segmental fixation (cemented) with pedicle screw and mazor;  Surgeon: Kristeen Miss, MD;  Location: [place];  Service: Neurosurgery;  Laterality: N/A;   OPEN REDUCTION INTERNAL FIXATION (ORIF) PROXIMAL PHALANX Left 08/30/2013   Procedure: OPEN REDUCTION INTERNAL FIXATION (ORIF) PROXIMAL PHALANX FRACTURE LEFT SMALL FINGER; SPLINT RING FINGER;  Surgeon: Wynonia Sours, MD;  Location: [place];  Service: Orthopedics;  Laterality: Left;   POSTERIOR LUMBAR FUSION 4 LEVEL N/A 07/17/2017   Procedure: Thoracic Ten -  Lumbar Five revison of hardware with Mazor;  Surgeon: Kristeen Miss, MD;  Location: [place];  Service: Neurosurgery;  Laterality: N/A;  Thoracic/Lumbar   PTOSIS REPAIR Bilateral 07/27/2015   Procedure: PTOSIS REPAIR;  Surgeon: Cristine Polio, MD;  Location: [place];  Service: Plastics;  Laterality: Bilateral;   SHOULDER SURGERY     rt rcr,and lt   TONSILLECTOMY     TOTAL KNEE ARTHROPLASTY Right 12/23/2019   Procedure: TOTAL KNEE ARTHROPLASTY;  Surgeon: Vickey Huger, MD;  [facility];  Service: Orthopedics;  Laterality: Right;    There were no vitals filed for this visit.   Subjective Assessment - 02/18/21 1440     Subjective Saw Dr. Ellene Route yesterday, told him my back was hurting some, reported that it looked good.,, She reports that I was a little sore after the last treatment    Currently in Pain? Yes    Pain Score 2     Pain Location Back    Pain Orientation Lower    Aggravating Factors  sore    Pain Relieving Factors tylenol                               [facility] Adult PT Treatment/Exercise - 02/18/21 0001       Ambulation/Gait   Gait Comments gait with 2 SPC, 1SPC, 2x each      Knee/Hip Exercises: Aerobic   Recumbent Bike level 2 x 5 minutes    Nustep level 5 x 5 minutes      Knee/Hip Exercises: Machines for Strengthening   Cybex Knee Extension 5# 2x10 then right only 5x    Cybex Knee Flexion 20# 2x10 , then right only 15# 3x5    Other Machine leg press 20# 2x10. 40# x10, then right only 20# small ROM 2x5      Knee/Hip Exercises: Standing   Hip Abduction Right;3 sets;10 reps    Abduction Limitations 2    Hip Extension Right;3 sets;10 reps    Extension Limitations 2      Knee/Hip Exercises: Seated   Other Seated Knee/Hip Exercises right ankle DF, red tband for PF and eversion all 3x10, Hamstring curls red tband    Marching Right;3 sets;10 reps    Marching Limitations 2#                       PT Short Term Goals - 01/28/21 1846       PT SHORT TERM GOAL #1   Title independent with initial HEP    Status Partially Met               PT Long Term Goals - 02/18/21 1527       PT LONG TERM GOAL #1   Title decrease pain 50%    Status Partially Met                   Plan - 02/18/21 1526     Clinical Impression Statement Reported that she was a little sore but reported that she felt stronger in the hips after the last treatment, we repeated what we did but did a little more walking.   Tolerated this well and no c/o pain    PT Next Visit Plan continue to increase her function and safety    Consulted and Agree with Plan of Care Patient  Patient will benefit from skilled therapeutic intervention in order to improve the following deficits and impairments:  Abnormal gait, Decreased range of motion, Decreased balance, Decreased scar mobility, Impaired flexibility, Decreased strength, Decreased mobility, Decreased endurance, Pain, Cardiopulmonary status limiting activity, Decreased activity tolerance, Postural dysfunction, Improper body mechanics, Increased muscle spasms, Difficulty walking  Visit Diagnosis: Acute bilateral low back pain without sciatica  Muscle spasm of back  Difficulty in walking, not elsewhere classified  Muscle weakness (generalized)  Pain in thoracic spine     Problem List Patient Active Problem List   Diagnosis Date Noted   Myelopathy concurrent with and due to spinal stenosis of thoracic region ([place]) 10/27/2020   S/P total knee arthroplasty, right 12/23/2019   Acalculous cholecystitis 08/24/2018   Osteoarthritis of right knee    Vertigo    DDD (degenerative disc disease), cervical    Benign essential HTN    History of DVT (deep vein thrombosis)    Tachycardia    Steroid-induced hyperglycemia    Hypothyroidism    Neuropathic pain    Acute blood loss anemia    S/P lumbar fusion    Closed L5 vertebral fracture (HCC) 08/31/2017   Closed fracture of fifth lumbar vertebra ([place]) 08/30/2017   Lumbar vertebral fracture ([place]) 07/17/2017   Scoliosis 06/13/2017    Sumner Boast, PT 02/18/2021, 3:27 PM  [place]. [place], [place], [address] Phone: [phone]   Fax:  [phone]  Name: DARIELYS GIGLIA MRN: [id number] Date of Birth: April 10, 1932

## 2021-02-22 ENCOUNTER — Ambulatory Visit: Payer: Medicare Other | Admitting: Physical Therapy

## 2021-02-22 ENCOUNTER — Other Ambulatory Visit: Payer: Self-pay

## 2021-02-22 ENCOUNTER — Encounter: Payer: Self-pay | Admitting: Physical Therapy

## 2021-02-22 DIAGNOSIS — M545 Low back pain, unspecified: Secondary | ICD-10-CM

## 2021-02-22 DIAGNOSIS — R262 Difficulty in walking, not elsewhere classified: Secondary | ICD-10-CM

## 2021-02-22 DIAGNOSIS — M62830 Muscle spasm of back: Secondary | ICD-10-CM

## 2021-02-22 DIAGNOSIS — M6281 Muscle weakness (generalized): Secondary | ICD-10-CM

## 2021-02-22 DIAGNOSIS — M546 Pain in thoracic spine: Secondary | ICD-10-CM

## 2021-02-22 NOTE — Therapy (Signed)
[place]. [place], [place], [address] Phone: [phone]   Fax:  [phone]  Physical Therapy Treatment  Patient Details  Name: [name] MRN: [id number] Date of Birth: April 13, 1932 Referring Provider (PT): Elsner   Encounter Date: 02/22/2021   PT End of Session - 02/22/21 1843     Visit Number 9    Date for PT Re-Evaluation 04/08/21    PT Start Time 1800    PT Stop Time 1844    PT Time Calculation (min) 44 min    Activity Tolerance Patient tolerated treatment well    Behavior During Therapy [facility] for tasks assessed/performed             Past Medical History:  Diagnosis Date   Acalculous cholecystitis 08/24/2018   Arthritis    DVT of lower extremity (deep venous thrombosis) (HCC)    right leg - after back surgery in 2019   GERD (gastroesophageal reflux disease)    History of blood transfusion    Hypothyroidism    PONV (postoperative nausea and vomiting)    Ptosis of both eyelids    Vertigo    Wears glasses    reading    Past Surgical History:  Procedure Laterality Date   ABDOMINAL HYSTERECTOMY  6503   APPLICATION OF ROBOTIC ASSISTANCE FOR SPINAL PROCEDURE N/A 07/17/2017   Procedure: APPLICATION OF ROBOTIC ASSISTANCE FOR SPINAL PROCEDURE;  Surgeon: Kristeen Miss, MD;  Location: [place];  Service: Neurosurgery;  Laterality: N/A;   APPLICATION OF ROBOTIC ASSISTANCE FOR SPINAL PROCEDURE N/A 09/01/2017   Procedure: APPLICATION OF ROBOTIC ASSISTANCE FOR SPINAL PROCEDURE;  Surgeon: Kristeen Miss, MD;  Location: [place];  Service: Neurosurgery;  Laterality: N/A;   APPLICATION OF ROBOTIC ASSISTANCE FOR SPINAL PROCEDURE N/A 10/27/2020   Procedure: APPLICATION OF ROBOTIC ASSISTANCE FOR SPINAL PROCEDURE;  Surgeon: Kristeen Miss, MD;  Location: [place];  Service: Neurosurgery;  Laterality: N/A;   BACK SURGERY  1986   lumb lam   CARPAL TUNNEL RELEASE  2000   right   CARPAL TUNNEL RELEASE  2012   left    CHOLECYSTECTOMY N/A 08/24/2018   Procedure: LAPAROSCOPIC CHOLECYSTECTOMY WITH INTRAOPERATIVE CHOLANGIOGRAM;  Surgeon: Fanny Skates, MD;  Location: [facility] OR;  Service: General;  Laterality: N/A;   COLONOSCOPY     CYSTOCELE REPAIR  2007   sling   DISTAL INTERPHALANGEAL JOINT FUSION Right 02/04/2014   Procedure: FUSION DISTAL INTERPHALANGEAL JOINT RIGHT INDEX FINGER ;  Surgeon: Daryll Brod, MD;  Location: [place];  Service: Orthopedics;  Laterality: Right;   EYE SURGERY Bilateral    Cataract surgery with lens implant   KNEE SURGERY  2009,2011   partial knee   LAPAROSCOPY N/A 08/24/2018   Procedure: LAPAROSCOPY DIAGNOSTIC;  Surgeon: Fanny Skates, MD;  Location: [place];  Service: General;  Laterality: N/A;   LUMBAR PERCUTANEOUS PEDICLE SCREW 4 LEVEL N/A 10/27/2020   Procedure: Thoracic nine-ten Laminectomy with extension of fusion from Thoracic ten to Thoracic six with segmental fixation (cemented) with pedicle screw and mazor;  Surgeon: Kristeen Miss, MD;  Location: [place];  Service: Neurosurgery;  Laterality: N/A;   OPEN REDUCTION INTERNAL FIXATION (ORIF) PROXIMAL PHALANX Left 08/30/2013   Procedure: OPEN REDUCTION INTERNAL FIXATION (ORIF) PROXIMAL PHALANX FRACTURE LEFT SMALL FINGER; SPLINT RING FINGER;  Surgeon: Wynonia Sours, MD;  Location: [place];  Service: Orthopedics;  Laterality: Left;   POSTERIOR LUMBAR FUSION 4 LEVEL N/A 07/17/2017   Procedure: Thoracic Ten -  Lumbar Five revison of hardware with Mazor;  Surgeon: Kristeen Miss, MD;  Location: [place];  Service: Neurosurgery;  Laterality: N/A;  Thoracic/Lumbar   PTOSIS REPAIR Bilateral 07/27/2015   Procedure: PTOSIS REPAIR;  Surgeon: Cristine Polio, MD;  Location: [place];  Service: Plastics;  Laterality: Bilateral;   SHOULDER SURGERY     rt rcr,and lt   TONSILLECTOMY     TOTAL KNEE ARTHROPLASTY Right 12/23/2019   Procedure: TOTAL KNEE ARTHROPLASTY;  Surgeon: Vickey Huger, MD;  [facility];  Service: Orthopedics;  Laterality: Right;    There were no vitals filed for this visit.   Subjective Assessment - 02/22/21 1802     Subjective I was not too bad just a little sore    Currently in Pain? Yes    Pain Score 2     Pain Location Back    Aggravating Factors  walking                               [facility] Adult PT Treatment/Exercise - 02/22/21 0001       Ambulation/Gait   Gait Comments 2SPC's 200 feet, rest, then stairs step over step up and down and an aditional 200 feet      Knee/Hip Exercises: Aerobic   Recumbent Bike level 2 x 5 minutes    Nustep level 5 x 5 minutes      Knee/Hip Exercises: Machines for Strengthening   Cybex Knee Extension 10# 2x10    Cybex Knee Flexion 20# 2x15 , then right only 15# 3x5    Other Machine leg press 20# 3x10      Knee/Hip Exercises: Standing   Hip Abduction Right;3 sets;10 reps    Abduction Limitations 2    Hip Extension Right;3 sets;10 reps    Extension Limitations 2      Knee/Hip Exercises: Seated   Other Seated Knee/Hip Exercises right ankle DF, then PF/INV/EV with red tband    Marching Right;3 sets;10 reps    Marching Limitations 2#                       PT Short Term Goals - 01/28/21 1846       PT SHORT TERM GOAL #1   Title independent with initial HEP    Status Partially Met               PT Long Term Goals - 02/18/21 1527       PT LONG TERM GOAL #1   Title decrease pain 50%    Status Partially Met                   Plan - 02/22/21 1844     Clinical Impression Statement Patient fatigues with the longer walking but with the two SPC's she does not have the significant trendelenberg that I have seen in the past.  She reports that she feels stronger, still struggles with stairs going down, she alos reports fatigue with activity    PT Next Visit Plan continue to increase her function and safety    Consulted and Agree with Plan of Care Patient              Patient will benefit from skilled therapeutic intervention in order to improve the following deficits and impairments:  Abnormal gait, Decreased range of motion, Decreased balance, Decreased scar mobility, Impaired flexibility, Decreased strength, Decreased mobility, Decreased  endurance, Pain, Cardiopulmonary status limiting activity, Decreased activity tolerance, Postural dysfunction, Improper body mechanics, Increased muscle spasms, Difficulty walking  Visit Diagnosis: Acute bilateral low back pain without sciatica  Muscle spasm of back  Difficulty in walking, not elsewhere classified  Muscle weakness (generalized)  Pain in thoracic spine     Problem List Patient Active Problem List   Diagnosis Date Noted   Myelopathy concurrent with and due to spinal stenosis of thoracic region ([place]) 10/27/2020   S/P total knee arthroplasty, right 12/23/2019   Acalculous cholecystitis 08/24/2018   Osteoarthritis of right knee    Vertigo    DDD (degenerative disc disease), cervical    Benign essential HTN    History of DVT (deep vein thrombosis)    Tachycardia    Steroid-induced hyperglycemia    Hypothyroidism    Neuropathic pain    Acute blood loss anemia    S/P lumbar fusion    Closed L5 vertebral fracture ([place]) 08/31/2017   Closed fracture of fifth lumbar vertebra ([place]) 08/30/2017   Lumbar vertebral fracture ([place]) 07/17/2017   Scoliosis 06/13/2017    Sumner Boast, PT 02/22/2021, 6:45 PM  [place]. [place], [place], [address] Phone: [phone]   Fax:  [phone]  Name: [name] MRN: [id number] Date of Birth: 01-13-1933

## 2021-02-24 ENCOUNTER — Encounter: Payer: Self-pay | Admitting: Physical Therapy

## 2021-02-24 ENCOUNTER — Other Ambulatory Visit: Payer: Self-pay

## 2021-02-24 ENCOUNTER — Ambulatory Visit: Payer: Medicare Other | Admitting: Physical Therapy

## 2021-02-24 DIAGNOSIS — M545 Low back pain, unspecified: Secondary | ICD-10-CM

## 2021-02-24 DIAGNOSIS — M546 Pain in thoracic spine: Secondary | ICD-10-CM

## 2021-02-24 DIAGNOSIS — M62830 Muscle spasm of back: Secondary | ICD-10-CM

## 2021-02-24 DIAGNOSIS — M6281 Muscle weakness (generalized): Secondary | ICD-10-CM

## 2021-02-24 DIAGNOSIS — M25561 Pain in right knee: Secondary | ICD-10-CM

## 2021-02-24 DIAGNOSIS — R262 Difficulty in walking, not elsewhere classified: Secondary | ICD-10-CM

## 2021-02-24 NOTE — Therapy (Signed)
[place]. [place], [place], [address] Phone: [phone]   Fax:  [phone] Progress Note Reporting Period 01/06/21 to 02/24/21  See note below for Objective Data and Assessment of Progress/Goals.     Physical Therapy Treatment  Patient Details  Name: [name] MRN: [id number] Date of Birth: 07-31-1932 Referring Provider (PT): Elsner   Encounter Date: 02/24/2021   PT End of Session - 02/24/21 1736     Visit Number 10    Date for PT Re-Evaluation 04/08/21    PT Start Time 1540    PT Stop Time 1737    PT Time Calculation (min) 47 min    Activity Tolerance Patient tolerated treatment well    Behavior During Therapy [facility] for tasks assessed/performed             Past Medical History:  Diagnosis Date   Acalculous cholecystitis 08/24/2018   Arthritis    DVT of lower extremity (deep venous thrombosis) (HCC)    right leg - after back surgery in 2019   GERD (gastroesophageal reflux disease)    History of blood transfusion    Hypothyroidism    PONV (postoperative nausea and vomiting)    Ptosis of both eyelids    Vertigo    Wears glasses    reading    Past Surgical History:  Procedure Laterality Date   ABDOMINAL HYSTERECTOMY  0867   APPLICATION OF ROBOTIC ASSISTANCE FOR SPINAL PROCEDURE N/A 07/17/2017   Procedure: APPLICATION OF ROBOTIC ASSISTANCE FOR SPINAL PROCEDURE;  Surgeon: Kristeen Miss, MD;  Location: [place];  Service: Neurosurgery;  Laterality: N/A;   APPLICATION OF ROBOTIC ASSISTANCE FOR SPINAL PROCEDURE N/A 09/01/2017   Procedure: APPLICATION OF ROBOTIC ASSISTANCE FOR SPINAL PROCEDURE;  Surgeon: Kristeen Miss, MD;  Location: [place];  Service: Neurosurgery;  Laterality: N/A;   APPLICATION OF ROBOTIC ASSISTANCE FOR SPINAL PROCEDURE N/A 10/27/2020   Procedure: APPLICATION OF ROBOTIC ASSISTANCE FOR SPINAL PROCEDURE;  Surgeon: Kristeen Miss, MD;  Location: [place];  Service: Neurosurgery;   Laterality: N/A;   BACK SURGERY  1986   lumb lam   CARPAL TUNNEL RELEASE  2000   right   CARPAL TUNNEL RELEASE  2012   left   CHOLECYSTECTOMY N/A 08/24/2018   Procedure: LAPAROSCOPIC CHOLECYSTECTOMY WITH INTRAOPERATIVE CHOLANGIOGRAM;  Surgeon: Fanny Skates, MD;  Location[facility];  Service: General;  Laterality: N/A;   COLONOSCOPY     CYSTOCELE REPAIR  2007   sling   DISTAL INTERPHALANGEAL JOINT FUSION Right 02/04/2014   Procedure: FUSION DISTAL INTERPHALANGEAL JOINT RIGHT INDEX FINGER ;  Surgeon: Daryll Brod, MD;  Location: [place];  Service: Orthopedics;  Laterality: Right;   EYE SURGERY Bilateral    Cataract surgery with lens implant   KNEE SURGERY  2009,2011   partial knee   LAPAROSCOPY N/A 08/24/2018   Procedure: LAPAROSCOPY DIAGNOSTIC;  Surgeon: Fanny Skates, MD;  Location: [place];  Service: General;  Laterality: N/A;   LUMBAR PERCUTANEOUS PEDICLE SCREW 4 LEVEL N/A 10/27/2020   Procedure: Thoracic nine-ten Laminectomy with extension of fusion from Thoracic ten to Thoracic six with segmental fixation (cemented) with pedicle screw and mazor;  Surgeon: Kristeen Miss, MD;  Location: [place];  Service: Neurosurgery;  Laterality: N/A;   OPEN REDUCTION INTERNAL FIXATION (ORIF) PROXIMAL PHALANX Left 08/30/2013   Procedure: OPEN REDUCTION INTERNAL FIXATION (ORIF) PROXIMAL PHALANX FRACTURE LEFT SMALL FINGER; SPLINT RING FINGER;  Surgeon: Wynonia Sours, MD;  Location: [place];  Service: Orthopedics;  Laterality: Left;   POSTERIOR LUMBAR FUSION 4 LEVEL N/A 07/17/2017   Procedure: Thoracic Ten - Lumbar Five revison of hardware with Mazor;  Surgeon: Kristeen Miss, MD;  Location: [place];  Service: Neurosurgery;  Laterality: N/A;  Thoracic/Lumbar   PTOSIS REPAIR Bilateral 07/27/2015   Procedure: PTOSIS REPAIR;  Surgeon: Cristine Polio, MD;  Location: [place];  Service: Plastics;  Laterality: Bilateral;   SHOULDER SURGERY     rt rcr,and lt    TONSILLECTOMY     TOTAL KNEE ARTHROPLASTY Right 12/23/2019   Procedure: TOTAL KNEE ARTHROPLASTY;  Surgeon: Vickey Huger, MD;  [facility];  Service: Orthopedics;  Laterality: Right;    There were no vitals filed for this visit.   Subjective Assessment - 02/24/21 1656     Subjective My back is a little sore today, unsure why    Currently in Pain? Yes    Pain Location Back    Pain Orientation Mid;Lower    Pain Descriptors / Indicators Aching;Dull    Aggravating Factors  she reports unsure why hurting more, reports started today after lunch                [facility] PT Assessment - 02/24/21 0001       Timed Up and Go Test   Normal TUG (seconds) 30                           [facility] Adult PT Treatment/Exercise - 02/24/21 0001       Ambulation/Gait   Gait Comments 2SPC's 200 feet, then rest and 1SPC 100 feet x 2      Knee/Hip Exercises: Aerobic   Recumbent Bike level 2 x 5 minutes    Nustep level 5 x 5 minutes      Knee/Hip Exercises: Machines for Strengthening   Cybex Knee Extension 10# 2x10      Knee/Hip Exercises: Standing   Hip Flexion Both;2 sets;10 reps    Hip Abduction 3 sets;10 reps;Both    Abduction Limitations 2    Hip Extension 3 sets;10 reps;Both    Extension Limitations 2      Knee/Hip Exercises: Seated   Other Seated Knee/Hip Exercises right ankle DF, then PF/INV/EV with red tband                       PT Short Term Goals - 01/28/21 1846       PT SHORT TERM GOAL #1   Title independent with initial HEP    Status Partially Met               PT Long Term Goals - 02/24/21 1741       PT LONG TERM GOAL #1   Title decrease pain 50%    Status Partially Met      PT LONG TERM GOAL #2   Title increase lumbar ROM 25% with less pain    Status On-going      PT LONG TERM GOAL #3   Title increase LE strength to 4+/5    Status On-going      PT LONG TERM GOAL #4   Title go up and down stairs step over step     Status Partially Met                   Plan - 02/24/21 1737     Clinical Impression Statement Patient with some increased  LBP, she reports that she wonders if it is the leg press, we did not do this today.  She made good improvement with the TUG    PT Next Visit Plan continue to increase her function and safety    Consulted and Agree with Plan of Care Patient             Patient will benefit from skilled therapeutic intervention in order to improve the following deficits and impairments:  Abnormal gait, Decreased range of motion, Decreased balance, Decreased scar mobility, Impaired flexibility, Decreased strength, Decreased mobility, Decreased endurance, Pain, Cardiopulmonary status limiting activity, Decreased activity tolerance, Postural dysfunction, Improper body mechanics, Increased muscle spasms, Difficulty walking  Visit Diagnosis: Acute bilateral low back pain without sciatica  Muscle spasm of back  Difficulty in walking, not elsewhere classified  Muscle weakness (generalized)  Pain in thoracic spine  Acute pain of right knee     Problem List Patient Active Problem List   Diagnosis Date Noted   Myelopathy concurrent with and due to spinal stenosis of thoracic region ([place]) 10/27/2020   S/P total knee arthroplasty, right 12/23/2019   Acalculous cholecystitis 08/24/2018   Osteoarthritis of right knee    Vertigo    DDD (degenerative disc disease), cervical    Benign essential HTN    History of DVT (deep vein thrombosis)    Tachycardia    Steroid-induced hyperglycemia    Hypothyroidism    Neuropathic pain    Acute blood loss anemia    S/P lumbar fusion    Closed L5 vertebral fracture (HCC) 08/31/2017   Closed fracture of fifth lumbar vertebra ([place]) 08/30/2017   Lumbar vertebral fracture ([place]) 07/17/2017   Scoliosis 06/13/2017    Sumner Boast, PT 02/24/2021, 5:42 PM  [place]. [place], [place], [address] Phone: [phone]   Fax:  [phone]  Name: [name] MRN: [id number] Date of Birth: 18-Jun-1932

## 2021-03-01 ENCOUNTER — Other Ambulatory Visit: Payer: Self-pay | Admitting: Internal Medicine

## 2021-03-01 DIAGNOSIS — Z1231 Encounter for screening mammogram for malignant neoplasm of breast: Secondary | ICD-10-CM

## 2021-03-02 ENCOUNTER — Ambulatory Visit: Payer: Medicare Other | Admitting: Physical Therapy

## 2021-03-02 ENCOUNTER — Other Ambulatory Visit: Payer: Self-pay

## 2021-03-02 ENCOUNTER — Encounter: Payer: Self-pay | Admitting: Physical Therapy

## 2021-03-02 DIAGNOSIS — M62830 Muscle spasm of back: Secondary | ICD-10-CM

## 2021-03-02 DIAGNOSIS — M545 Low back pain, unspecified: Secondary | ICD-10-CM | POA: Diagnosis not present

## 2021-03-02 DIAGNOSIS — R262 Difficulty in walking, not elsewhere classified: Secondary | ICD-10-CM

## 2021-03-02 DIAGNOSIS — M6281 Muscle weakness (generalized): Secondary | ICD-10-CM

## 2021-03-02 DIAGNOSIS — M546 Pain in thoracic spine: Secondary | ICD-10-CM

## 2021-03-02 NOTE — Therapy (Signed)
[place]. [place], [place], [address] Phone: [phone]   Fax:  [phone]  Physical Therapy Treatment  Patient Details  Name: [name] MRN: [id number] Date of Birth: [birth year] Referring Provider (PT): Elsner   Encounter Date: 03/02/2021   PT End of Session - 03/02/21 1053     Visit Number 11    Date for PT Re-Evaluation 04/08/21    PT Start Time 1008    PT Stop Time 1053    PT Time Calculation (min) 45 min    Activity Tolerance Patient tolerated treatment well    Behavior During Therapy [facility] for tasks assessed/performed             Past Medical History:  Diagnosis Date   Acalculous cholecystitis 08/24/2018   Arthritis    DVT of lower extremity (deep venous thrombosis) (HCC)    right leg - after back surgery in 2019   GERD (gastroesophageal reflux disease)    History of blood transfusion    Hypothyroidism    PONV (postoperative nausea and vomiting)    Ptosis of both eyelids    Vertigo    Wears glasses    reading    Past Surgical History:  Procedure Laterality Date   ABDOMINAL HYSTERECTOMY  4239   APPLICATION OF ROBOTIC ASSISTANCE FOR SPINAL PROCEDURE N/A 07/17/2017   Procedure: APPLICATION OF ROBOTIC ASSISTANCE FOR SPINAL PROCEDURE;  Surgeon: Kristeen Miss, MD;  Location: [place];  Service: Neurosurgery;  Laterality: N/A;   APPLICATION OF ROBOTIC ASSISTANCE FOR SPINAL PROCEDURE N/A 09/01/2017   Procedure: APPLICATION OF ROBOTIC ASSISTANCE FOR SPINAL PROCEDURE;  Surgeon: Kristeen Miss, MD;  Location: [place];  Service: Neurosurgery;  Laterality: N/A;   APPLICATION OF ROBOTIC ASSISTANCE FOR SPINAL PROCEDURE N/A 10/27/2020   Procedure: APPLICATION OF ROBOTIC ASSISTANCE FOR SPINAL PROCEDURE;  Surgeon: Kristeen Miss, MD;  Location: [place];  Service: Neurosurgery;  Laterality: N/A;   BACK SURGERY  1986   lumb lam   CARPAL TUNNEL RELEASE  2000   right   CARPAL TUNNEL RELEASE  2012   left    CHOLECYSTECTOMY N/A 08/24/2018   Procedure: LAPAROSCOPIC CHOLECYSTECTOMY WITH INTRAOPERATIVE CHOLANGIOGRAM;  Surgeon: Fanny Skates, MD;  Location[facility];  Service: General;  Laterality: N/A;   COLONOSCOPY     CYSTOCELE REPAIR  2007   sling   DISTAL INTERPHALANGEAL JOINT FUSION Right 02/04/2014   Procedure: FUSION DISTAL INTERPHALANGEAL JOINT RIGHT INDEX FINGER ;  Surgeon: Daryll Brod, MD;  Location: [place];  Service: Orthopedics;  Laterality: Right;   EYE SURGERY Bilateral    Cataract surgery with lens implant   KNEE SURGERY  2009,2011   partial knee   LAPAROSCOPY N/A 08/24/2018   Procedure: LAPAROSCOPY DIAGNOSTIC;  Surgeon: Fanny Skates, MD;  Location: [place];  Service: General;  Laterality: N/A;   LUMBAR PERCUTANEOUS PEDICLE SCREW 4 LEVEL N/A 10/27/2020   Procedure: Thoracic nine-ten Laminectomy with extension of fusion from Thoracic ten to Thoracic six with segmental fixation (cemented) with pedicle screw and mazor;  Surgeon: Kristeen Miss, MD;  Location: [place];  Service: Neurosurgery;  Laterality: N/A;   OPEN REDUCTION INTERNAL FIXATION (ORIF) PROXIMAL PHALANX Left 08/30/2013   Procedure: OPEN REDUCTION INTERNAL FIXATION (ORIF) PROXIMAL PHALANX FRACTURE LEFT SMALL FINGER; SPLINT RING FINGER;  Surgeon: Wynonia Sours, MD;  Location: [place];  Service: Orthopedics;  Laterality: Left;   POSTERIOR LUMBAR FUSION 4 LEVEL N/A 07/17/2017   Procedure: Thoracic Ten -  Lumbar Five revison of hardware with Mazor;  Surgeon: Kristeen Miss, MD;  Location: [place];  Service: Neurosurgery;  Laterality: N/A;  Thoracic/Lumbar   PTOSIS REPAIR Bilateral 07/27/2015   Procedure: PTOSIS REPAIR;  Surgeon: Cristine Polio, MD;  Location: [place];  Service: Plastics;  Laterality: Bilateral;   SHOULDER SURGERY     rt rcr,and lt   TONSILLECTOMY     TOTAL KNEE ARTHROPLASTY Right 12/23/2019   Procedure: TOTAL KNEE ARTHROPLASTY;  Surgeon: Vickey Huger, MD;  [facility];  Service: Orthopedics;  Laterality: Right;    There were no vitals filed for this visit.   Subjective Assessment - 03/02/21 1011     Subjective I am doing okay, not sore today    Currently in Pain? No/denies                               [facility] Adult PT Treatment/Exercise - 03/02/21 0001       Ambulation/Gait   Gait Comments 2SPC's 200 feet, then rest and 1SPC 100 feet x 2      High Level Balance   High Level Balance Activities Side stepping    High Level Balance Comments 2 SPC's cone toe touches      Knee/Hip Exercises: Aerobic   Recumbent Bike level 3 x 5 minutes    Nustep level 5 x 6 minutes      Knee/Hip Exercises: Machines for Strengthening   Cybex Knee Extension 10# 2x10    Cybex Knee Flexion 20# 2x15 , then right only 15# 3x5    Other Machine seated row 15# lats 20#      Knee/Hip Exercises: Standing   Hip Abduction 3 sets;10 reps;Both    Abduction Limitations 2    Hip Extension 3 sets;10 reps;Both    Extension Limitations 2      Knee/Hip Exercises: Seated   Long Arc Quad Both;10 reps;3 sets    [employer] Weight 2 lbs.    Other Seated Knee/Hip Exercises right ankle DF, then PF/INV/EV with red tband    Other Seated Knee/Hip Exercises ball in lap isometric abs    Marching Right;3 sets;10 reps    Marching Limitations 2#                       PT Short Term Goals - 01/28/21 1846       PT SHORT TERM GOAL #1   Title independent with initial HEP    Status Partially Met               PT Long Term Goals - 02/24/21 1741       PT LONG TERM GOAL #1   Title decrease pain 50%    Status Partially Met      PT LONG TERM GOAL #2   Title increase lumbar ROM 25% with less pain    Status On-going      PT LONG TERM GOAL #3   Title increase LE strength to 4+/5    Status On-going      PT LONG TERM GOAL #4   Title go up and down stairs step over step    Status Partially Met                   Plan - 03/02/21  1053     Clinical Impression Statement I really pushed some endurance and some different strngth she  reported being very fatigued at the end.  She reported some right hip soreness at the end    PT Next Visit Plan see how she recovers    Consulted and Agree with Plan of Care Patient             Patient will benefit from skilled therapeutic intervention in order to improve the following deficits and impairments:  Abnormal gait, Decreased range of motion, Decreased balance, Decreased scar mobility, Impaired flexibility, Decreased strength, Decreased mobility, Decreased endurance, Pain, Cardiopulmonary status limiting activity, Decreased activity tolerance, Postural dysfunction, Improper body mechanics, Increased muscle spasms, Difficulty walking  Visit Diagnosis: Acute bilateral low back pain without sciatica  Muscle spasm of back  Difficulty in walking, not elsewhere classified  Muscle weakness (generalized)  Pain in thoracic spine     Problem List Patient Active Problem List   Diagnosis Date Noted   Myelopathy concurrent with and due to spinal stenosis of thoracic region ([place]) 10/27/2020   S/P total knee arthroplasty, right 12/23/2019   Acalculous cholecystitis 08/24/2018   Osteoarthritis of right knee    Vertigo    DDD (degenerative disc disease), cervical    Benign essential HTN    History of DVT (deep vein thrombosis)    Tachycardia    Steroid-induced hyperglycemia    Hypothyroidism    Neuropathic pain    Acute blood loss anemia    S/P lumbar fusion    Closed L5 vertebral fracture ([place]) 08/31/2017   Closed fracture of fifth lumbar vertebra ([place]) 08/30/2017   Lumbar vertebral fracture ([place]) 07/17/2017   Scoliosis 06/13/2017    Sumner Boast, PT 03/02/2021, 10:55 AM  [place]. [place], [place], [address] Phone: [phone]   Fax:  [phone]  Name: [name] MRN:  [id number] Date of Birth: [birth year]

## 2021-03-04 ENCOUNTER — Other Ambulatory Visit: Payer: Self-pay

## 2021-03-04 ENCOUNTER — Ambulatory Visit: Payer: Medicare Other | Attending: Neurological Surgery | Admitting: Physical Therapy

## 2021-03-04 ENCOUNTER — Encounter: Payer: Self-pay | Admitting: Physical Therapy

## 2021-03-04 DIAGNOSIS — R262 Difficulty in walking, not elsewhere classified: Secondary | ICD-10-CM | POA: Diagnosis present

## 2021-03-04 DIAGNOSIS — M62830 Muscle spasm of back: Secondary | ICD-10-CM | POA: Diagnosis present

## 2021-03-04 DIAGNOSIS — M6281 Muscle weakness (generalized): Secondary | ICD-10-CM | POA: Diagnosis present

## 2021-03-04 DIAGNOSIS — M545 Low back pain, unspecified: Secondary | ICD-10-CM | POA: Insufficient documentation

## 2021-03-04 DIAGNOSIS — M546 Pain in thoracic spine: Secondary | ICD-10-CM | POA: Diagnosis present

## 2021-03-04 NOTE — Therapy (Signed)
[place]. [place], [place], [address] Phone: [phone]   Fax:  [phone]  Physical Therapy Treatment  Patient Details  Name: [name] MRN: [id number] Date of Birth: [birth year] Referring Provider (PT): Elsner   Encounter Date: 03/04/2021   PT End of Session - 03/04/21 1845     Visit Number 12    Date for PT Re-Evaluation 04/08/21    PT Start Time 1708    PT Stop Time 1752    PT Time Calculation (min) 44 min    Activity Tolerance Patient tolerated treatment well    Behavior During Therapy [facility] for tasks assessed/performed             Past Medical History:  Diagnosis Date   Acalculous cholecystitis 08/24/2018   Arthritis    DVT of lower extremity (deep venous thrombosis) (HCC)    right leg - after back surgery in 2019   GERD (gastroesophageal reflux disease)    History of blood transfusion    Hypothyroidism    PONV (postoperative nausea and vomiting)    Ptosis of both eyelids    Vertigo    Wears glasses    reading    Past Surgical History:  Procedure Laterality Date   ABDOMINAL HYSTERECTOMY  8453   APPLICATION OF ROBOTIC ASSISTANCE FOR SPINAL PROCEDURE N/A 07/17/2017   Procedure: APPLICATION OF ROBOTIC ASSISTANCE FOR SPINAL PROCEDURE;  Surgeon: Kristeen Miss, MD;  Location: [place];  Service: Neurosurgery;  Laterality: N/A;   APPLICATION OF ROBOTIC ASSISTANCE FOR SPINAL PROCEDURE N/A 09/01/2017   Procedure: APPLICATION OF ROBOTIC ASSISTANCE FOR SPINAL PROCEDURE;  Surgeon: Kristeen Miss, MD;  Location: [place];  Service: Neurosurgery;  Laterality: N/A;   APPLICATION OF ROBOTIC ASSISTANCE FOR SPINAL PROCEDURE N/A 10/27/2020   Procedure: APPLICATION OF ROBOTIC ASSISTANCE FOR SPINAL PROCEDURE;  Surgeon: Kristeen Miss, MD;  Location: [place];  Service: Neurosurgery;  Laterality: N/A;   BACK SURGERY  1986   lumb lam   CARPAL TUNNEL RELEASE  2000   right   CARPAL TUNNEL RELEASE  2012   left    CHOLECYSTECTOMY N/A 08/24/2018   Procedure: LAPAROSCOPIC CHOLECYSTECTOMY WITH INTRAOPERATIVE CHOLANGIOGRAM;  Surgeon: Fanny Skates, MD;  Location: [facility] OR;  Service: General;  Laterality: N/A;   COLONOSCOPY     CYSTOCELE REPAIR  2007   sling   DISTAL INTERPHALANGEAL JOINT FUSION Right 02/04/2014   Procedure: FUSION DISTAL INTERPHALANGEAL JOINT RIGHT INDEX FINGER ;  Surgeon: Daryll Brod, MD;  Location: [place];  Service: Orthopedics;  Laterality: Right;   EYE SURGERY Bilateral    Cataract surgery with lens implant   KNEE SURGERY  2009,2011   partial knee   LAPAROSCOPY N/A 08/24/2018   Procedure: LAPAROSCOPY DIAGNOSTIC;  Surgeon: Fanny Skates, MD;  Location: [place];  Service: General;  Laterality: N/A;   LUMBAR PERCUTANEOUS PEDICLE SCREW 4 LEVEL N/A 10/27/2020   Procedure: Thoracic nine-ten Laminectomy with extension of fusion from Thoracic ten to Thoracic six with segmental fixation (cemented) with pedicle screw and mazor;  Surgeon: Kristeen Miss, MD;  Location: [place];  Service: Neurosurgery;  Laterality: N/A;   OPEN REDUCTION INTERNAL FIXATION (ORIF) PROXIMAL PHALANX Left 08/30/2013   Procedure: OPEN REDUCTION INTERNAL FIXATION (ORIF) PROXIMAL PHALANX FRACTURE LEFT SMALL FINGER; SPLINT RING FINGER;  Surgeon: Wynonia Sours, MD;  Location: [place];  Service: Orthopedics;  Laterality: Left;   POSTERIOR LUMBAR FUSION 4 LEVEL N/A 07/17/2017   Procedure: Thoracic Ten -  Lumbar Five revison of hardware with Mazor;  Surgeon: Kristeen Miss, MD;  Location: [place];  Service: Neurosurgery;  Laterality: N/A;  Thoracic/Lumbar   PTOSIS REPAIR Bilateral 07/27/2015   Procedure: PTOSIS REPAIR;  Surgeon: Cristine Polio, MD;  Location: [place];  Service: Plastics;  Laterality: Bilateral;   SHOULDER SURGERY     rt rcr,and lt   TONSILLECTOMY     TOTAL KNEE ARTHROPLASTY Right 12/23/2019   Procedure: TOTAL KNEE ARTHROPLASTY;  Surgeon: Vickey Huger, MD;  [facility];  Service: Orthopedics;  Laterality: Right;    There were no vitals filed for this visit.   Subjective Assessment - 03/04/21 1713     Subjective Doing pretty good, still some pain in the back    Currently in Pain? Yes    Pain Score 3     Pain Location Back    Pain Orientation Mid;Lower    Pain Descriptors / Indicators Aching;Dull    Pain Relieving Factors Tylenol                               [facility] Adult PT Treatment/Exercise - 03/04/21 0001       Ambulation/Gait   Gait Comments SPC x200 feet, SPC x 100 feet, finished with 2oo feet with 2 SPC's, then 100 feet with SPC      High Level Balance   High Level Balance Comments 2 SPC's cone toe touches      Knee/Hip Exercises: Aerobic   Recumbent Bike level 3.5 x 6 minutes    Nustep level 5 x 6 minutes      Knee/Hip Exercises: Machines for Strengthening   Cybex Knee Extension 10# 2x10    Cybex Knee Flexion 20# 2x15 , then right only 15# 3x5    Other Machine seated row 15# lats 20#      Knee/Hip Exercises: Standing   Hip Abduction 3 sets;10 reps;Both    Abduction Limitations 2    Hip Extension 3 sets;10 reps;Both    Extension Limitations 2                       PT Short Term Goals - 01/28/21 1846       PT SHORT TERM GOAL #1   Title independent with initial HEP    Status Partially Met               PT Long Term Goals - 03/04/21 1849       PT LONG TERM GOAL #1   Title decrease pain 50%    Status Partially Met      PT LONG TERM GOAL #2   Title increase lumbar ROM 25% with less pain    Status On-going      PT LONG TERM GOAL #3   Title increase LE strength to 4+/5    Status Partially Met      PT LONG TERM GOAL #4   Title go up and down stairs step over step    Status Partially Met      PT LONG TERM GOAL #5   Title decrease TUG to 18 seconds    Status On-going                   Plan - 03/04/21 1846     Clinical Impression Statement I had her walk more  today with the [facility], she was able to get to about  180 feet before the trendelenberg kicked in on the right, she had previously with the [facility] would have the wobble start out at first, she is doing much better with this, she is still having some back pain, she does report that she will have an injection in the future.    PT Next Visit Plan will incorporate some balance in the treatment plan    Consulted and Agree with Plan of Care Patient             Patient will benefit from skilled therapeutic intervention in order to improve the following deficits and impairments:  Abnormal gait, Decreased range of motion, Decreased balance, Decreased scar mobility, Impaired flexibility, Decreased strength, Decreased mobility, Decreased endurance, Pain, Cardiopulmonary status limiting activity, Decreased activity tolerance, Postural dysfunction, Improper body mechanics, Increased muscle spasms, Difficulty walking  Visit Diagnosis: Acute bilateral low back pain without sciatica  Muscle spasm of back  Difficulty in walking, not elsewhere classified  Muscle weakness (generalized)  Pain in thoracic spine     Problem List Patient Active Problem List   Diagnosis Date Noted   Myelopathy concurrent with and due to spinal stenosis of thoracic region ([place]) 10/27/2020   S/P total knee arthroplasty, right 12/23/2019   Acalculous cholecystitis 08/24/2018   Osteoarthritis of right knee    Vertigo    DDD (degenerative disc disease), cervical    Benign essential HTN    History of DVT (deep vein thrombosis)    Tachycardia    Steroid-induced hyperglycemia    Hypothyroidism    Neuropathic pain    Acute blood loss anemia    S/P lumbar fusion    Closed L5 vertebral fracture (HCC) 08/31/2017   Closed fracture of fifth lumbar vertebra ([place]) 08/30/2017   Lumbar vertebral fracture ([place]) 07/17/2017   Scoliosis 06/13/2017    Sumner Boast, PT 03/04/2021, 6:49 PM  [place]. [place], [place], [address] Phone: [phone]   Fax:  [phone]  Name: LYDIANA MILLEY MRN: [id number] Date of Birth: Nov 30, 1932

## 2021-03-09 ENCOUNTER — Encounter: Payer: Self-pay | Admitting: Physical Therapy

## 2021-03-09 ENCOUNTER — Other Ambulatory Visit: Payer: Self-pay

## 2021-03-09 ENCOUNTER — Ambulatory Visit: Payer: Medicare Other | Admitting: Physical Therapy

## 2021-03-09 DIAGNOSIS — M62830 Muscle spasm of back: Secondary | ICD-10-CM

## 2021-03-09 DIAGNOSIS — M6281 Muscle weakness (generalized): Secondary | ICD-10-CM

## 2021-03-09 DIAGNOSIS — R262 Difficulty in walking, not elsewhere classified: Secondary | ICD-10-CM

## 2021-03-09 DIAGNOSIS — M545 Low back pain, unspecified: Secondary | ICD-10-CM | POA: Diagnosis not present

## 2021-03-09 DIAGNOSIS — M546 Pain in thoracic spine: Secondary | ICD-10-CM

## 2021-03-09 NOTE — Therapy (Signed)
[place]. [place], [place], [address] Phone: [phone]   Fax:  [phone]  Physical Therapy Treatment  Patient Details  Name: [name] MRN: [id number] Date of Birth: 06/07/1932 Referring Provider (PT): Elsner   Encounter Date: 03/09/2021   PT End of Session - 03/09/21 1657     Visit Number 13    Date for PT Re-Evaluation 04/08/21    PT Start Time 1608    PT Stop Time 1653    PT Time Calculation (min) 45 min    Activity Tolerance Patient tolerated treatment well    Behavior During Therapy [facility] for tasks assessed/performed             Past Medical History:  Diagnosis Date   Acalculous cholecystitis 08/24/2018   Arthritis    DVT of lower extremity (deep venous thrombosis) (HCC)    right leg - after back surgery in 2019   GERD (gastroesophageal reflux disease)    History of blood transfusion    Hypothyroidism    PONV (postoperative nausea and vomiting)    Ptosis of both eyelids    Vertigo    Wears glasses    reading    Past Surgical History:  Procedure Laterality Date   ABDOMINAL HYSTERECTOMY  0932   APPLICATION OF ROBOTIC ASSISTANCE FOR SPINAL PROCEDURE N/A 07/17/2017   Procedure: APPLICATION OF ROBOTIC ASSISTANCE FOR SPINAL PROCEDURE;  Surgeon: Kristeen Miss, MD;  Location: [place];  Service: Neurosurgery;  Laterality: N/A;   APPLICATION OF ROBOTIC ASSISTANCE FOR SPINAL PROCEDURE N/A 09/01/2017   Procedure: APPLICATION OF ROBOTIC ASSISTANCE FOR SPINAL PROCEDURE;  Surgeon: Kristeen Miss, MD;  Location: [place];  Service: Neurosurgery;  Laterality: N/A;   APPLICATION OF ROBOTIC ASSISTANCE FOR SPINAL PROCEDURE N/A 10/27/2020   Procedure: APPLICATION OF ROBOTIC ASSISTANCE FOR SPINAL PROCEDURE;  Surgeon: Kristeen Miss, MD;  Location: [place];  Service: Neurosurgery;  Laterality: N/A;   BACK SURGERY  1986   lumb lam   CARPAL TUNNEL RELEASE  2000   right   CARPAL TUNNEL RELEASE  2012   left    CHOLECYSTECTOMY N/A 08/24/2018   Procedure: LAPAROSCOPIC CHOLECYSTECTOMY WITH INTRAOPERATIVE CHOLANGIOGRAM;  Surgeon: Fanny Skates, MD;  Location: [facility];  Service: General;  Laterality: N/A;   COLONOSCOPY     CYSTOCELE REPAIR  2007   sling   DISTAL INTERPHALANGEAL JOINT FUSION Right 02/04/2014   Procedure: FUSION DISTAL INTERPHALANGEAL JOINT RIGHT INDEX FINGER ;  Surgeon: Daryll Brod, MD;  Location: [place];  Service: Orthopedics;  Laterality: Right;   EYE SURGERY Bilateral    Cataract surgery with lens implant   KNEE SURGERY  2009,2011   partial knee   LAPAROSCOPY N/A 08/24/2018   Procedure: LAPAROSCOPY DIAGNOSTIC;  Surgeon: Fanny Skates, MD;  Location: [place];  Service: General;  Laterality: N/A;   LUMBAR PERCUTANEOUS PEDICLE SCREW 4 LEVEL N/A 10/27/2020   Procedure: Thoracic nine-ten Laminectomy with extension of fusion from Thoracic ten to Thoracic six with segmental fixation (cemented) with pedicle screw and mazor;  Surgeon: Kristeen Miss, MD;  Location: [place];  Service: Neurosurgery;  Laterality: N/A;   OPEN REDUCTION INTERNAL FIXATION (ORIF) PROXIMAL PHALANX Left 08/30/2013   Procedure: OPEN REDUCTION INTERNAL FIXATION (ORIF) PROXIMAL PHALANX FRACTURE LEFT SMALL FINGER; SPLINT RING FINGER;  Surgeon: Wynonia Sours, MD;  Location: [place];  Service: Orthopedics;  Laterality: Left;   POSTERIOR LUMBAR FUSION 4 LEVEL N/A 07/17/2017   Procedure: Thoracic Ten -  Lumbar Five revison of hardware with Mazor;  Surgeon: Kristeen Miss, MD;  Location: [place];  Service: Neurosurgery;  Laterality: N/A;  Thoracic/Lumbar   PTOSIS REPAIR Bilateral 07/27/2015   Procedure: PTOSIS REPAIR;  Surgeon: Cristine Polio, MD;  Location: [place];  Service: Plastics;  Laterality: Bilateral;   SHOULDER SURGERY     rt rcr,and lt   TONSILLECTOMY     TOTAL KNEE ARTHROPLASTY Right 12/23/2019   Procedure: TOTAL KNEE ARTHROPLASTY;  Surgeon: Vickey Huger, MD;  [facility];  Service: Orthopedics;  Laterality: Right;    There were no vitals filed for this visit.   Subjective Assessment - 03/09/21 1611     Subjective Still need to walk better.  Reports that she is tired, did some shopping with her daughter in law    Currently in Pain? No/denies                               [facility] Adult PT Treatment/Exercise - 03/09/21 0001       Ambulation/Gait   Gait Comments 2 SPC's 2x 200 feet, then x 100 feet      High Level Balance   High Level Balance Activities Negotitating around obstacles;Negotiating over obstacles    High Level Balance Comments 2 SPC's cone toe touches, on airex in [place] 6" toe touches, side stepping on the airex, sit to stand on airex,      Knee/Hip Exercises: Aerobic   Recumbent Bike level 3.5 x 6 minutes    Nustep level 5 x 6 minutes      Knee/Hip Exercises: Machines for Strengthening   Cybex Knee Extension 10# 2x10    Cybex Knee Flexion 20# 2x15 , then right only 15# 3x5    Other Machine seated row 20# lats 20#      Knee/Hip Exercises: Seated   Other Seated Knee/Hip Exercises fiter 1 blue band    Other Seated Knee/Hip Exercises ball in lap isometric abs                       PT Short Term Goals - 01/28/21 1846       PT SHORT TERM GOAL #1   Title independent with initial HEP    Status Partially Met               PT Long Term Goals - 03/09/21 1702       PT LONG TERM GOAL #1   Title decrease pain 50%    Status Partially Met      PT LONG TERM GOAL #2   Title increase lumbar ROM 25% with less pain    Status Partially Met                   Plan - 03/09/21 1658     Clinical Impression Statement Patient really struggles with the dynamic exercises, She is very scared on the airex and does not feel comfortable.  She was fatigued today and c/o her legs bveing tired.    PT Next Visit Plan will incorporate some balance in the treatment plan    Consulted and Agree with Plan  of Care Patient             Patient will benefit from skilled therapeutic intervention in order to improve the following deficits and impairments:  Abnormal gait, Decreased range of motion, Decreased balance, Decreased scar mobility, Impaired flexibility, Decreased  strength, Decreased mobility, Decreased endurance, Pain, Cardiopulmonary status limiting activity, Decreased activity tolerance, Postural dysfunction, Improper body mechanics, Increased muscle spasms, Difficulty walking  Visit Diagnosis: Acute bilateral low back pain without sciatica  Muscle spasm of back  Difficulty in walking, not elsewhere classified  Muscle weakness (generalized)  Pain in thoracic spine     Problem List Patient Active Problem List   Diagnosis Date Noted   Myelopathy concurrent with and due to spinal stenosis of thoracic region ([place]) 10/27/2020   S/P total knee arthroplasty, right 12/23/2019   Acalculous cholecystitis 08/24/2018   Osteoarthritis of right knee    Vertigo    DDD (degenerative disc disease), cervical    Benign essential HTN    History of DVT (deep vein thrombosis)    Tachycardia    Steroid-induced hyperglycemia    Hypothyroidism    Neuropathic pain    Acute blood loss anemia    S/P lumbar fusion    Closed L5 vertebral fracture ([place]) 08/31/2017   Closed fracture of fifth lumbar vertebra ([place]) 08/30/2017   Lumbar vertebral fracture ([place]) 07/17/2017   Scoliosis 06/13/2017    Sumner Boast, PT 03/09/2021, 5:03 PM  [place]. [place], [place], [address] Phone: [phone]   Fax:  [phone]  Name: [name] MRN: [id number] Date of Birth: [birth year]

## 2021-03-10 ENCOUNTER — Ambulatory Visit
Admission: RE | Admit: 2021-03-10 | Discharge: 2021-03-10 | Disposition: A | Discharge status: Home / Self Care | Payer: Medicare Other | Source: Ambulatory Visit | Attending: Orthopedic Surgery | Admitting: Orthopedic Surgery

## 2021-03-10 ENCOUNTER — Other Ambulatory Visit: Payer: Medicare Other

## 2021-03-10 DIAGNOSIS — R2232 Localized swelling, mass and lump, left upper limb: Secondary | ICD-10-CM

## 2021-03-10 DIAGNOSIS — M65332 Trigger finger, left middle finger: Secondary | ICD-10-CM

## 2021-03-11 ENCOUNTER — Ambulatory Visit: Payer: Medicare Other | Admitting: Physical Therapy

## 2021-03-16 ENCOUNTER — Other Ambulatory Visit: Payer: Self-pay

## 2021-03-16 ENCOUNTER — Encounter: Payer: Self-pay | Admitting: Physical Therapy

## 2021-03-16 ENCOUNTER — Ambulatory Visit: Payer: Medicare Other | Admitting: Physical Therapy

## 2021-03-16 DIAGNOSIS — R262 Difficulty in walking, not elsewhere classified: Secondary | ICD-10-CM

## 2021-03-16 DIAGNOSIS — M546 Pain in thoracic spine: Secondary | ICD-10-CM

## 2021-03-16 DIAGNOSIS — M545 Low back pain, unspecified: Secondary | ICD-10-CM

## 2021-03-16 DIAGNOSIS — M6281 Muscle weakness (generalized): Secondary | ICD-10-CM

## 2021-03-16 DIAGNOSIS — M62830 Muscle spasm of back: Secondary | ICD-10-CM

## 2021-03-16 NOTE — Therapy (Signed)
[place] [address]. [place], [place], [address] Phone: [phone]   Fax:  [phone]  Physical Therapy Treatment  Patient Details  Name: [name] MRN: [id number] Date of Birth: [birth year] Referring Provider (PT): Elsner   Encounter Date: 03/16/2021   PT End of Session - 03/16/21 1702     Visit Number 14    Date for PT Re-Evaluation 04/08/21    PT Start Time 1613    PT Stop Time 1700    PT Time Calculation (min) 47 min    Activity Tolerance Patient tolerated treatment well    Behavior During Therapy [facility] for tasks assessed/performed             Past Medical History:  Diagnosis Date   Acalculous cholecystitis 08/24/2018   Arthritis    DVT of lower extremity (deep venous thrombosis) (HCC)    right leg - after back surgery in 2019   GERD (gastroesophageal reflux disease)    History of blood transfusion    Hypothyroidism    PONV (postoperative nausea and vomiting)    Ptosis of both eyelids    Vertigo    Wears glasses    reading    Past Surgical History:  Procedure Laterality Date   ABDOMINAL HYSTERECTOMY  2458   APPLICATION OF ROBOTIC ASSISTANCE FOR SPINAL PROCEDURE N/A 07/17/2017   Procedure: APPLICATION OF ROBOTIC ASSISTANCE FOR SPINAL PROCEDURE;  Surgeon: Kristeen Miss, MD;  Location: [place];  Service: Neurosurgery;  Laterality: N/A;   APPLICATION OF ROBOTIC ASSISTANCE FOR SPINAL PROCEDURE N/A 09/01/2017   Procedure: APPLICATION OF ROBOTIC ASSISTANCE FOR SPINAL PROCEDURE;  Surgeon: Kristeen Miss, MD;  Location: [place];  Service: Neurosurgery;  Laterality: N/A;   APPLICATION OF ROBOTIC ASSISTANCE FOR SPINAL PROCEDURE N/A 10/27/2020   Procedure: APPLICATION OF ROBOTIC ASSISTANCE FOR SPINAL PROCEDURE;  Surgeon: Kristeen Miss, MD;  Location: [place];  Service: Neurosurgery;  Laterality: N/A;   BACK SURGERY  1986   lumb lam   CARPAL TUNNEL RELEASE  2000   right   CARPAL TUNNEL RELEASE  2012   left    CHOLECYSTECTOMY N/A 08/24/2018   Procedure: LAPAROSCOPIC CHOLECYSTECTOMY WITH INTRAOPERATIVE CHOLANGIOGRAM;  Surgeon: Fanny Skates, MD;  Location: [facility];  Service: General;  Laterality: N/A;   COLONOSCOPY     CYSTOCELE REPAIR  2007   sling   DISTAL INTERPHALANGEAL JOINT FUSION Right 02/04/2014   Procedure: FUSION DISTAL INTERPHALANGEAL JOINT RIGHT INDEX FINGER ;  Surgeon: Daryll Brod, MD;  Location: [place];  Service: Orthopedics;  Laterality: Right;   EYE SURGERY Bilateral    Cataract surgery with lens implant   KNEE SURGERY  2009,2011   partial knee   LAPAROSCOPY N/A 08/24/2018   Procedure: LAPAROSCOPY DIAGNOSTIC;  Surgeon: Fanny Skates, MD;  Location: [place];  Service: General;  Laterality: N/A;   LUMBAR PERCUTANEOUS PEDICLE SCREW 4 LEVEL N/A 10/27/2020   Procedure: Thoracic nine-ten Laminectomy with extension of fusion from Thoracic ten to Thoracic six with segmental fixation (cemented) with pedicle screw and mazor;  Surgeon: Kristeen Miss, MD;  Location: [place];  Service: Neurosurgery;  Laterality: N/A;   OPEN REDUCTION INTERNAL FIXATION (ORIF) PROXIMAL PHALANX Left 08/30/2013   Procedure: OPEN REDUCTION INTERNAL FIXATION (ORIF) PROXIMAL PHALANX FRACTURE LEFT SMALL FINGER; SPLINT RING FINGER;  Surgeon: Wynonia Sours, MD;  Location: [place];  Service: Orthopedics;  Laterality: Left;   POSTERIOR LUMBAR FUSION 4 LEVEL N/A 07/17/2017   Procedure: Thoracic Ten -  Lumbar Five revison of hardware with Mazor;  Surgeon: Kristeen Miss, MD;  Location: [place];  Service: Neurosurgery;  Laterality: N/A;  Thoracic/Lumbar   PTOSIS REPAIR Bilateral 07/27/2015   Procedure: PTOSIS REPAIR;  Surgeon: Cristine Polio, MD;  Location: [place];  Service: Plastics;  Laterality: Bilateral;   SHOULDER SURGERY     rt rcr,and lt   TONSILLECTOMY     TOTAL KNEE ARTHROPLASTY Right 12/23/2019   Procedure: TOTAL KNEE ARTHROPLASTY;  Surgeon: Vickey Huger, MD;  [facility];  Service: Orthopedics;  Laterality: Right;    There were no vitals filed for this visit.   Subjective Assessment - 03/16/21 1617     Subjective Reports that she got an inkjeciton last week.  Reports some pain in the back and the ankles    Currently in Pain? Yes    Pain Score 2     Pain Location Back    Aggravating Factors  doing laundry today                               [facility] Adult PT Treatment/Exercise - 03/16/21 0001       Ambulation/Gait   Gait Comments SPC 4x100 feet with very light HHA      High Level Balance   High Level Balance Activities Side stepping;Backward walking    High Level Balance Comments toe touches on cone with a SPC, beach ball toss      Knee/Hip Exercises: Aerobic   Recumbent Bike level 3.5 x 6 minutes    Nustep level 5 x 6 minutes      Knee/Hip Exercises: Machines for Strengthening   Cybex Knee Extension 10# 2x10    Cybex Knee Flexion 20# 2x15 , then right only 15# 3x5    Other Machine seated row 20# lats 20# 3x10      Knee/Hip Exercises: Standing   Hip Flexion Both;2 sets;10 reps    Hip Flexion Limitations 3#    Hip Abduction 3 sets;10 reps;Both    Abduction Limitations 3#    Hip Extension 3 sets;10 reps;Both    Extension Limitations 3#                       PT Short Term Goals - 01/28/21 1846       PT SHORT TERM GOAL #1   Title independent with initial HEP    Status Partially Met               PT Long Term Goals - 03/16/21 1704       PT LONG TERM GOAL #1   Title decrease pain 50%    Status Partially Met      PT LONG TERM GOAL #2   Title increase lumbar ROM 25% with less pain    Status Partially Met      PT LONG TERM GOAL #3   Title increase LE strength to 4+/5    Status Partially Met      PT LONG TERM GOAL #4   Title go up and down stairs step over step    Status Partially Met                   Plan - 03/16/21 1702     Clinical Impression Statement Patietn is  tired today reports her legs are fatigued, she reports doing multiple loads of laundry.  Has some increased low back  pain, had an injection last week.  She struggled a little more today with the movements but no increase of pain just the fatigue    PT Next Visit Plan continue to work on strength, gait and function    Consulted and Agree with Plan of Care Patient             Patient will benefit from skilled therapeutic intervention in order to improve the following deficits and impairments:  Abnormal gait, Decreased range of motion, Decreased balance, Decreased scar mobility, Impaired flexibility, Decreased strength, Decreased mobility, Decreased endurance, Pain, Cardiopulmonary status limiting activity, Decreased activity tolerance, Postural dysfunction, Improper body mechanics, Increased muscle spasms, Difficulty walking  Visit Diagnosis: Acute bilateral low back pain without sciatica  Muscle spasm of back  Difficulty in walking, not elsewhere classified  Muscle weakness (generalized)  Pain in thoracic spine     Problem List Patient Active Problem List   Diagnosis Date Noted   Myelopathy concurrent with and due to spinal stenosis of thoracic region ([place]) 10/27/2020   S/P total knee arthroplasty, right 12/23/2019   Acalculous cholecystitis 08/24/2018   Osteoarthritis of right knee    Vertigo    DDD (degenerative disc disease), cervical    Benign essential HTN    History of DVT (deep vein thrombosis)    Tachycardia    Steroid-induced hyperglycemia    Hypothyroidism    Neuropathic pain    Acute blood loss anemia    S/P lumbar fusion    Closed L5 vertebral fracture ([place]) 08/31/2017   Closed fracture of fifth lumbar vertebra ([place]) 08/30/2017   Lumbar vertebral fracture ([place]) 07/17/2017   Scoliosis 06/13/2017    Sumner Boast, PT 03/16/2021, 5:05 PM  [place]. [place], [place], [address] Phone:  [phone]   Fax:  [phone]  Name: [name] MRN: [id number] Date of Birth: 06-14-1932

## 2021-03-18 ENCOUNTER — Ambulatory Visit: Payer: Medicare Other | Admitting: Physical Therapy

## 2021-03-23 ENCOUNTER — Encounter: Payer: Self-pay | Admitting: Physical Therapy

## 2021-03-23 ENCOUNTER — Ambulatory Visit: Payer: Medicare Other | Admitting: Physical Therapy

## 2021-03-23 ENCOUNTER — Other Ambulatory Visit: Payer: Self-pay

## 2021-03-23 DIAGNOSIS — M62830 Muscle spasm of back: Secondary | ICD-10-CM

## 2021-03-23 DIAGNOSIS — M545 Low back pain, unspecified: Secondary | ICD-10-CM

## 2021-03-23 DIAGNOSIS — M6281 Muscle weakness (generalized): Secondary | ICD-10-CM

## 2021-03-23 DIAGNOSIS — M546 Pain in thoracic spine: Secondary | ICD-10-CM

## 2021-03-23 DIAGNOSIS — R262 Difficulty in walking, not elsewhere classified: Secondary | ICD-10-CM

## 2021-03-23 NOTE — Therapy (Signed)
[address]. [place], [place], [address] Phone: [phone]   Fax:  [phone]  Physical Therapy Treatment  Patient Details  Name: [name] MRN: [id number] Date of Birth: 17-Aug-1932 Referring Provider (PT): Elsner   Encounter Date: 03/23/2021   PT End of Session - 03/23/21 1657     Visit Number 15    Date for PT Re-Evaluation 04/08/21    PT Start Time 1608    PT Stop Time 1652    PT Time Calculation (min) 44 min    Activity Tolerance Patient tolerated treatment well    Behavior During Therapy [facility] for tasks assessed/performed             Past Medical History:  Diagnosis Date   Acalculous cholecystitis 08/24/2018   Arthritis    DVT of lower extremity (deep venous thrombosis) (HCC)    right leg - after back surgery in 2019   GERD (gastroesophageal reflux disease)    History of blood transfusion    Hypothyroidism    PONV (postoperative nausea and vomiting)    Ptosis of both eyelids    Vertigo    Wears glasses    reading    Past Surgical History:  Procedure Laterality Date   ABDOMINAL HYSTERECTOMY  4801   APPLICATION OF ROBOTIC ASSISTANCE FOR SPINAL PROCEDURE N/A 07/17/2017   Procedure: APPLICATION OF ROBOTIC ASSISTANCE FOR SPINAL PROCEDURE;  Surgeon: Kristeen Miss, MD;  Location: [place];  Service: Neurosurgery;  Laterality: N/A;   APPLICATION OF ROBOTIC ASSISTANCE FOR SPINAL PROCEDURE N/A 09/01/2017   Procedure: APPLICATION OF ROBOTIC ASSISTANCE FOR SPINAL PROCEDURE;  Surgeon: Kristeen Miss, MD;  Location: [place];  Service: Neurosurgery;  Laterality: N/A;   APPLICATION OF ROBOTIC ASSISTANCE FOR SPINAL PROCEDURE N/A 10/27/2020   Procedure: APPLICATION OF ROBOTIC ASSISTANCE FOR SPINAL PROCEDURE;  Surgeon: Kristeen Miss, MD;  Location: [place];  Service: Neurosurgery;  Laterality: N/A;   BACK SURGERY  1986   lumb lam   CARPAL TUNNEL RELEASE  2000   right   CARPAL TUNNEL RELEASE  2012   left    CHOLECYSTECTOMY N/A 08/24/2018   Procedure: LAPAROSCOPIC CHOLECYSTECTOMY WITH INTRAOPERATIVE CHOLANGIOGRAM;  Surgeon: Fanny Skates, MD;  Location: [facility] OR;  Service: General;  Laterality: N/A;   COLONOSCOPY     CYSTOCELE REPAIR  2007   sling   DISTAL INTERPHALANGEAL JOINT FUSION Right 02/04/2014   Procedure: FUSION DISTAL INTERPHALANGEAL JOINT RIGHT INDEX FINGER ;  Surgeon: Daryll Brod, MD;  Location: [place];  Service: Orthopedics;  Laterality: Right;   EYE SURGERY Bilateral    Cataract surgery with lens implant   KNEE SURGERY  2009,2011   partial knee   LAPAROSCOPY N/A 08/24/2018   Procedure: LAPAROSCOPY DIAGNOSTIC;  Surgeon: Fanny Skates, MD;  Location: [place];  Service: General;  Laterality: N/A;   LUMBAR PERCUTANEOUS PEDICLE SCREW 4 LEVEL N/A 10/27/2020   Procedure: Thoracic nine-ten Laminectomy with extension of fusion from Thoracic ten to Thoracic six with segmental fixation (cemented) with pedicle screw and mazor;  Surgeon: Kristeen Miss, MD;  Location: [place];  Service: Neurosurgery;  Laterality: N/A;   OPEN REDUCTION INTERNAL FIXATION (ORIF) PROXIMAL PHALANX Left 08/30/2013   Procedure: OPEN REDUCTION INTERNAL FIXATION (ORIF) PROXIMAL PHALANX FRACTURE LEFT SMALL FINGER; SPLINT RING FINGER;  Surgeon: Wynonia Sours, MD;  Location: [place];  Service: Orthopedics;  Laterality: Left;   POSTERIOR LUMBAR FUSION 4 LEVEL N/A 07/17/2017   Procedure: Thoracic Ten -  Lumbar Five revison of hardware with Mazor;  Surgeon: Kristeen Miss, MD;  Location: [place];  Service: Neurosurgery;  Laterality: N/A;  Thoracic/Lumbar   PTOSIS REPAIR Bilateral 07/27/2015   Procedure: PTOSIS REPAIR;  Surgeon: Cristine Polio, MD;  Location: [place];  Service: Plastics;  Laterality: Bilateral;   SHOULDER SURGERY     rt rcr,and lt   TONSILLECTOMY     TOTAL KNEE ARTHROPLASTY Right 12/23/2019   Procedure: TOTAL KNEE ARTHROPLASTY;  Surgeon: Vickey Huger, MD;  [facility];  Service: Orthopedics;  Laterality: Right;    There were no vitals filed for this visit.   Subjective Assessment - 03/23/21 1609     Subjective I am doing well, trying to walk some with the cane at home    Currently in Pain? Yes    Pain Score 2     Pain Location Back    Pain Orientation Lower    Pain Descriptors / Indicators Sore    Aggravating Factors  did some cooking and a little sore                               [facility] Adult PT Treatment/Exercise - 03/23/21 0001       Ambulation/Gait   Gait Comments SPC 3 x 100 feet, 2SPC 2x 200 feet      Knee/Hip Exercises: Aerobic   Recumbent Bike level 3.5 x 6 minutes    Nustep level 5 x 6 minutes      Knee/Hip Exercises: Machines for Strengthening   Cybex Knee Extension 10# 2x10    Cybex Knee Flexion 20# 2x15 , then right only 15# 3x5      Knee/Hip Exercises: Standing   Hip Flexion Both;2 sets;10 reps    Hip Flexion Limitations 3#    Hip Abduction 3 sets;10 reps;Both    Abduction Limitations 3#    Hip Extension 3 sets;10 reps;Both    Extension Limitations 3#      Knee/Hip Exercises: Seated   Other Seated Knee/Hip Exercises fiter 1 blue band    Other Seated Knee/Hip Exercises right ankle exercises                       PT Short Term Goals - 01/28/21 1846       PT SHORT TERM GOAL #1   Title independent with initial HEP    Status Partially Met               PT Long Term Goals - 03/16/21 1704       PT LONG TERM GOAL #1   Title decrease pain 50%    Status Partially Met      PT LONG TERM GOAL #2   Title increase lumbar ROM 25% with less pain    Status Partially Met      PT LONG TERM GOAL #3   Title increase LE strength to 4+/5    Status Partially Met      PT LONG TERM GOAL #4   Title go up and down stairs step over step    Status Partially Met                   Plan - 03/23/21 1658     Clinical Impression Statement Good motion in the ankle, doing better  with the walking the one SPC did well with and less of the trendelenberg, she does fatigue  as she goes but never any tripping issues but does use the left hand on the wall and to hold on to things if she gets close.    PT Next Visit Plan continue to work on strength, gait and function    Consulted and Agree with Plan of Care Patient             Patient will benefit from skilled therapeutic intervention in order to improve the following deficits and impairments:  Abnormal gait, Decreased range of motion, Decreased balance, Decreased scar mobility, Impaired flexibility, Decreased strength, Decreased mobility, Decreased endurance, Pain, Cardiopulmonary status limiting activity, Decreased activity tolerance, Postural dysfunction, Improper body mechanics, Increased muscle spasms, Difficulty walking  Visit Diagnosis: Acute bilateral low back pain without sciatica  Muscle spasm of back  Difficulty in walking, not elsewhere classified  Muscle weakness (generalized)  Pain in thoracic spine     Problem List Patient Active Problem List   Diagnosis Date Noted   Myelopathy concurrent with and due to spinal stenosis of thoracic region ([place]) 10/27/2020   S/P total knee arthroplasty, right 12/23/2019   Acalculous cholecystitis 08/24/2018   Osteoarthritis of right knee    Vertigo    DDD (degenerative disc disease), cervical    Benign essential HTN    History of DVT (deep vein thrombosis)    Tachycardia    Steroid-induced hyperglycemia    Hypothyroidism    Neuropathic pain    Acute blood loss anemia    S/P lumbar fusion    Closed L5 vertebral fracture (HCC) 08/31/2017   Closed fracture of fifth lumbar vertebra ([place]) 08/30/2017   Lumbar vertebral fracture ([place]) 07/17/2017   Scoliosis 06/13/2017    Sumner Boast, PT 03/23/2021, 5:01 PM  [place]. [place], [place], [address] Phone: [phone]   Fax:   [phone]  Name: [name] MRN: [id number] Date of Birth: 1932-06-27

## 2021-03-25 ENCOUNTER — Ambulatory Visit: Payer: Medicare Other | Admitting: Physical Therapy

## 2021-03-25 ENCOUNTER — Encounter: Payer: Self-pay | Admitting: Physical Therapy

## 2021-03-25 ENCOUNTER — Other Ambulatory Visit: Payer: Self-pay

## 2021-03-25 DIAGNOSIS — M545 Low back pain, unspecified: Secondary | ICD-10-CM

## 2021-03-25 DIAGNOSIS — R262 Difficulty in walking, not elsewhere classified: Secondary | ICD-10-CM

## 2021-03-25 DIAGNOSIS — M62830 Muscle spasm of back: Secondary | ICD-10-CM

## 2021-03-25 DIAGNOSIS — M6281 Muscle weakness (generalized): Secondary | ICD-10-CM

## 2021-03-25 DIAGNOSIS — M546 Pain in thoracic spine: Secondary | ICD-10-CM

## 2021-03-25 NOTE — Therapy (Signed)
[place]. [place], [place], [address] Phone: [phone]   Fax:  [phone]  Physical Therapy Treatment  Patient Details  Name: [name] MRN: [id number] Date of Birth: 1933/02/26 Referring Provider (PT): Elsner   Encounter Date: 03/25/2021   PT End of Session - 03/25/21 1653     Visit Number 16    Date for PT Re-Evaluation 04/08/21    PT Start Time 1605    PT Stop Time 1653    PT Time Calculation (min) 48 min    Activity Tolerance Patient tolerated treatment well    Behavior During Therapy [facility] for tasks assessed/performed             Past Medical History:  Diagnosis Date   Acalculous cholecystitis 08/24/2018   Arthritis    DVT of lower extremity (deep venous thrombosis) (HCC)    right leg - after back surgery in 2019   GERD (gastroesophageal reflux disease)    History of blood transfusion    Hypothyroidism    PONV (postoperative nausea and vomiting)    Ptosis of both eyelids    Vertigo    Wears glasses    reading    Past Surgical History:  Procedure Laterality Date   ABDOMINAL HYSTERECTOMY  8099   APPLICATION OF ROBOTIC ASSISTANCE FOR SPINAL PROCEDURE N/A 07/17/2017   Procedure: APPLICATION OF ROBOTIC ASSISTANCE FOR SPINAL PROCEDURE;  Surgeon: Kristeen Miss, MD;  Location: [place];  Service: Neurosurgery;  Laterality: N/A;   APPLICATION OF ROBOTIC ASSISTANCE FOR SPINAL PROCEDURE N/A 09/01/2017   Procedure: APPLICATION OF ROBOTIC ASSISTANCE FOR SPINAL PROCEDURE;  Surgeon: Kristeen Miss, MD;  Location: [place];  Service: Neurosurgery;  Laterality: N/A;   APPLICATION OF ROBOTIC ASSISTANCE FOR SPINAL PROCEDURE N/A 10/27/2020   Procedure: APPLICATION OF ROBOTIC ASSISTANCE FOR SPINAL PROCEDURE;  Surgeon: Kristeen Miss, MD;  Location: [place];  Service: Neurosurgery;  Laterality: N/A;   BACK SURGERY  1986   lumb lam   CARPAL TUNNEL RELEASE  2000   right   CARPAL TUNNEL RELEASE  2012   left    CHOLECYSTECTOMY N/A 08/24/2018   Procedure: LAPAROSCOPIC CHOLECYSTECTOMY WITH INTRAOPERATIVE CHOLANGIOGRAM;  Surgeon: Fanny Skates, MD;  Location: [facility];  Service: General;  Laterality: N/A;   COLONOSCOPY     CYSTOCELE REPAIR  2007   sling   DISTAL INTERPHALANGEAL JOINT FUSION Right 02/04/2014   Procedure: FUSION DISTAL INTERPHALANGEAL JOINT RIGHT INDEX FINGER ;  Surgeon: Daryll Brod, MD;  Location: [place];  Service: Orthopedics;  Laterality: Right;   EYE SURGERY Bilateral    Cataract surgery with lens implant   KNEE SURGERY  2009,2011   partial knee   LAPAROSCOPY N/A 08/24/2018   Procedure: LAPAROSCOPY DIAGNOSTIC;  Surgeon: Fanny Skates, MD;  Location: [place];  Service: General;  Laterality: N/A;   LUMBAR PERCUTANEOUS PEDICLE SCREW 4 LEVEL N/A 10/27/2020   Procedure: Thoracic nine-ten Laminectomy with extension of fusion from Thoracic ten to Thoracic six with segmental fixation (cemented) with pedicle screw and mazor;  Surgeon: Kristeen Miss, MD;  Location: [place];  Service: Neurosurgery;  Laterality: N/A;   OPEN REDUCTION INTERNAL FIXATION (ORIF) PROXIMAL PHALANX Left 08/30/2013   Procedure: OPEN REDUCTION INTERNAL FIXATION (ORIF) PROXIMAL PHALANX FRACTURE LEFT SMALL FINGER; SPLINT RING FINGER;  Surgeon: Wynonia Sours, MD;  Location: [place];  Service: Orthopedics;  Laterality: Left;   POSTERIOR LUMBAR FUSION 4 LEVEL N/A 07/17/2017   Procedure: Thoracic Ten -  Lumbar Five revison of hardware with Mazor;  Surgeon: Kristeen Miss, MD;  Location: [place];  Service: Neurosurgery;  Laterality: N/A;  Thoracic/Lumbar   PTOSIS REPAIR Bilateral 07/27/2015   Procedure: PTOSIS REPAIR;  Surgeon: Cristine Polio, MD;  Location: [place];  Service: Plastics;  Laterality: Bilateral;   SHOULDER SURGERY     rt rcr,and lt   TONSILLECTOMY     TOTAL KNEE ARTHROPLASTY Right 12/23/2019   Procedure: TOTAL KNEE ARTHROPLASTY;  Surgeon: Vickey Huger, MD;  [facility];  Service: Orthopedics;  Laterality: Right;    There were no vitals filed for this visit.   Subjective Assessment - 03/25/21 1610     Subjective My legs were so tired and weak after the last treatment    Currently in Pain? No/denies                               [facility] Adult PT Treatment/Exercise - 03/25/21 0001       Ambulation/Gait   Gait Comments SPC 2x100, 2 SPC 2x 150 feet      High Level Balance   High Level Balance Activities Side stepping;Backward walking    High Level Balance Comments toe touches on cone with a SPC, beach ball toss      Knee/Hip Exercises: Aerobic   Recumbent Bike level 3.5 x 6 minutes      Knee/Hip Exercises: Standing   Hip Flexion Both;2 sets;10 reps    Hip Flexion Limitations 3#    Hip Abduction 3 sets;10 reps;Both    Abduction Limitations 3#    Hip Extension 3 sets;10 reps;Both    Extension Limitations 3#      Knee/Hip Exercises: Seated   Other Seated Knee/Hip Exercises right ankle exercises                       PT Short Term Goals - 01/28/21 1846       PT SHORT TERM GOAL #1   Title independent with initial HEP    Status Partially Met               PT Long Term Goals - 03/25/21 1655       PT LONG TERM GOAL #1   Title decrease pain 50%    Status Partially Met      PT LONG TERM GOAL #2   Title increase lumbar ROM 25% with less pain    Status Partially Met      PT LONG TERM GOAL #3   Title increase LE strength to 4+/5    Status Partially Met                   Plan - 03/25/21 1653     Clinical Impression Statement Patient reports that her legs were very tired after the last visit, this seemed to be true today as she reported the leg feeling heavy and had some increased difficulty walking .    PT Next Visit Plan continue to work on strength, gait and function    Consulted and Agree with Plan of Care Patient             Patient will benefit from skilled therapeutic  intervention in order to improve the following deficits and impairments:  Abnormal gait, Decreased range of motion, Decreased balance, Decreased scar mobility, Impaired flexibility, Decreased strength, Decreased mobility, Decreased endurance, Pain, Cardiopulmonary status limiting activity, Decreased  activity tolerance, Postural dysfunction, Improper body mechanics, Increased muscle spasms, Difficulty walking  Visit Diagnosis: Acute bilateral low back pain without sciatica  Muscle spasm of back  Difficulty in walking, not elsewhere classified  Muscle weakness (generalized)  Pain in thoracic spine     Problem List Patient Active Problem List   Diagnosis Date Noted   Myelopathy concurrent with and due to spinal stenosis of thoracic region ([place]) 10/27/2020   S/P total knee arthroplasty, right 12/23/2019   Acalculous cholecystitis 08/24/2018   Osteoarthritis of right knee    Vertigo    DDD (degenerative disc disease), cervical    Benign essential HTN    History of DVT (deep vein thrombosis)    Tachycardia    Steroid-induced hyperglycemia    Hypothyroidism    Neuropathic pain    Acute blood loss anemia    S/P lumbar fusion    Closed L5 vertebral fracture ([place]) 08/31/2017   Closed fracture of fifth lumbar vertebra ([place]) 08/30/2017   Lumbar vertebral fracture ([place]) 07/17/2017   Scoliosis 06/13/2017    Sumner Boast, PT 03/25/2021, 4:55 PM  [place]. [place], [place], [address] Phone: [phone]   Fax:  [phone]  Name: MADGELINE RAYO MRN: [id number] Date of Birth: 29-Jan-1933

## 2021-03-30 ENCOUNTER — Other Ambulatory Visit: Payer: Self-pay

## 2021-03-30 ENCOUNTER — Encounter: Payer: Self-pay | Admitting: Physical Therapy

## 2021-03-30 ENCOUNTER — Ambulatory Visit: Payer: Medicare Other | Admitting: Physical Therapy

## 2021-03-30 DIAGNOSIS — M545 Low back pain, unspecified: Secondary | ICD-10-CM | POA: Diagnosis not present

## 2021-03-30 DIAGNOSIS — R262 Difficulty in walking, not elsewhere classified: Secondary | ICD-10-CM

## 2021-03-30 DIAGNOSIS — M6281 Muscle weakness (generalized): Secondary | ICD-10-CM

## 2021-03-30 DIAGNOSIS — M546 Pain in thoracic spine: Secondary | ICD-10-CM

## 2021-03-30 DIAGNOSIS — M62830 Muscle spasm of back: Secondary | ICD-10-CM

## 2021-03-30 NOTE — Therapy (Signed)
[name]. [name], [place], [address] Phone: [phone]   Fax:  [phone]  Physical Therapy Treatment  Patient Details  Name: [name] MRN: [id number] Date of Birth: 1932-07-12 Referring Provider (PT): Elsner   Encounter Date: 03/30/2021   PT End of Session - 03/30/21 1707     Visit Number 17    Date for PT Re-Evaluation 04/08/21    PT Start Time 1610    PT Stop Time 1700    PT Time Calculation (min) 50 min    Activity Tolerance Patient tolerated treatment well    Behavior During Therapy [facility] for tasks assessed/performed             Past Medical History:  Diagnosis Date   Acalculous cholecystitis 08/24/2018   Arthritis    DVT of lower extremity (deep venous thrombosis) (HCC)    right leg - after back surgery in 2019   GERD (gastroesophageal reflux disease)    History of blood transfusion    Hypothyroidism    PONV (postoperative nausea and vomiting)    Ptosis of both eyelids    Vertigo    Wears glasses    reading    Past Surgical History:  Procedure Laterality Date   ABDOMINAL HYSTERECTOMY  3159   APPLICATION OF ROBOTIC ASSISTANCE FOR SPINAL PROCEDURE N/A 07/17/2017   Procedure: APPLICATION OF ROBOTIC ASSISTANCE FOR SPINAL PROCEDURE;  Surgeon: Kristeen Miss, MD;  Location: [place];  Service: Neurosurgery;  Laterality: N/A;   APPLICATION OF ROBOTIC ASSISTANCE FOR SPINAL PROCEDURE N/A 09/01/2017   Procedure: APPLICATION OF ROBOTIC ASSISTANCE FOR SPINAL PROCEDURE;  Surgeon: Kristeen Miss, MD;  Location: [place];  Service: Neurosurgery;  Laterality: N/A;   APPLICATION OF ROBOTIC ASSISTANCE FOR SPINAL PROCEDURE N/A 10/27/2020   Procedure: APPLICATION OF ROBOTIC ASSISTANCE FOR SPINAL PROCEDURE;  Surgeon: Kristeen Miss, MD;  Location: [place];  Service: Neurosurgery;  Laterality: N/A;   BACK SURGERY  1986   lumb lam   CARPAL TUNNEL RELEASE  2000   right   CARPAL TUNNEL RELEASE  2012   left    CHOLECYSTECTOMY N/A 08/24/2018   Procedure: LAPAROSCOPIC CHOLECYSTECTOMY WITH INTRAOPERATIVE CHOLANGIOGRAM;  Surgeon: Cynthia Skates, MD;  Location: [facility] OR;  Service: General;  Laterality: N/A;   COLONOSCOPY     CYSTOCELE REPAIR  2007   sling   DISTAL INTERPHALANGEAL JOINT FUSION Right 02/04/2014   Procedure: FUSION DISTAL INTERPHALANGEAL JOINT RIGHT INDEX FINGER ;  Surgeon: Cynthia Brod, MD;  Location: [place];  Service: Orthopedics;  Laterality: Right;   EYE SURGERY Bilateral    Cataract surgery with lens implant   KNEE SURGERY  2009,2011   partial knee   LAPAROSCOPY N/A 08/24/2018   Procedure: LAPAROSCOPY DIAGNOSTIC;  Surgeon: Cynthia Skates, MD;  Location: [place];  Service: General;  Laterality: N/A;   LUMBAR PERCUTANEOUS PEDICLE SCREW 4 LEVEL N/A 10/27/2020   Procedure: Thoracic nine-ten Laminectomy with extension of fusion from Thoracic ten to Thoracic six with segmental fixation (cemented) with pedicle screw and mazor;  Surgeon: Kristeen Miss, MD;  Location: [place];  Service: Neurosurgery;  Laterality: N/A;   OPEN REDUCTION INTERNAL FIXATION (ORIF) PROXIMAL PHALANX Left 08/30/2013   Procedure: OPEN REDUCTION INTERNAL FIXATION (ORIF) PROXIMAL PHALANX FRACTURE LEFT SMALL FINGER; SPLINT RING FINGER;  Surgeon: Cynthia Sours, MD;  Location: [place];  Service: Orthopedics;  Laterality: Left;   POSTERIOR LUMBAR FUSION 4 LEVEL N/A 07/17/2017   Procedure: Thoracic Ten -  Lumbar Five revison of hardware with Mazor;  Surgeon: Kristeen Miss, MD;  Location: [place];  Service: Neurosurgery;  Laterality: N/A;  Thoracic/Lumbar   PTOSIS REPAIR Bilateral 07/27/2015   Procedure: PTOSIS REPAIR;  Surgeon: Cynthia Polio, MD;  Location: [place];  Service: Plastics;  Laterality: Bilateral;   SHOULDER SURGERY     rt rcr,and lt   TONSILLECTOMY     TOTAL KNEE ARTHROPLASTY Right 12/23/2019   Procedure: TOTAL KNEE ARTHROPLASTY;  Surgeon: Cynthia Huger, MD;  [facility];  Service: Orthopedics;  Laterality: Right;    There were no vitals filed for this visit.   Subjective Assessment - 03/30/21 1612     Subjective "My kneecaps are sore"  It was so cold this weekend    Currently in Pain? Yes    Pain Score 2     Pain Location Knee    Pain Orientation Left;Right    Pain Descriptors / Indicators Sore    Aggravating Factors  cold weather                               [facility] Adult PT Treatment/Exercise - 03/30/21 0001       Ambulation/Gait   Gait Comments SPC 3x100, stairs step over step      High Level Balance   High Level Balance Comments toe touches on cone with a SPC, beach ball toss, on airex holding banister marches , hip abduction and extension, side stepping over objects      Knee/Hip Exercises: Aerobic   Nustep level 5 x 6 minutes      Knee/Hip Exercises: Machines for Strengthening   Cybex Knee Extension 10# 2x10    Cybex Knee Flexion 20# 3x10      Knee/Hip Exercises: Seated   Other Seated Knee/Hip Exercises right ankle red tband exercises , calf and HS stretch                       PT Short Term Goals - 01/28/21 1846       PT SHORT TERM GOAL #1   Title independent with initial HEP    Status Partially Met               PT Long Term Goals - 03/25/21 1655       PT LONG TERM GOAL #1   Title decrease pain 50%    Status Partially Met      PT LONG TERM GOAL #2   Title increase lumbar ROM 25% with less pain    Status Partially Met      PT LONG TERM GOAL #3   Title increase LE strength to 4+/5    Status Partially Met                   Plan - 03/30/21 1707     Clinical Impression Statement Patient reports kneecap pain reports "cold weather".  She continue to experiment with the can and hand , correct would be in the left hand due to right LE weakness, however with the cane in the left she has less trendelenberg but has more unsteadiness.  I tried her picking up cones from the  floor as she reports that when spring comes she likes to get out in the yard, she does have a very difficult time on uneven or dynamic surfaces    PT Next Visit Plan balance, stairs, function  Consulted and Agree with Plan of Care Patient             Patient will benefit from skilled therapeutic intervention in order to improve the following deficits and impairments:  Abnormal gait, Decreased range of motion, Decreased balance, Decreased scar mobility, Impaired flexibility, Decreased strength, Decreased mobility, Decreased endurance, Pain, Cardiopulmonary status limiting activity, Decreased activity tolerance, Postural dysfunction, Improper body mechanics, Increased muscle spasms, Difficulty walking  Visit Diagnosis: Acute bilateral low back pain without sciatica  Muscle spasm of back  Difficulty in walking, not elsewhere classified  Muscle weakness (generalized)  Pain in thoracic spine     Problem List Patient Active Problem List   Diagnosis Date Noted   Myelopathy concurrent with and due to spinal stenosis of thoracic region ([place]) 10/27/2020   S/P total knee arthroplasty, right 12/23/2019   Acalculous cholecystitis 08/24/2018   Osteoarthritis of right knee    Vertigo    DDD (degenerative disc disease), cervical    Benign essential HTN    History of DVT (deep vein thrombosis)    Tachycardia    Steroid-induced hyperglycemia    Hypothyroidism    Neuropathic pain    Acute blood loss anemia    S/P lumbar fusion    Closed L5 vertebral fracture (HCC) 08/31/2017   Closed fracture of fifth lumbar vertebra ([place]) 08/30/2017   Lumbar vertebral fracture ([place]) 07/17/2017   Scoliosis 06/13/2017    Sumner Boast, PT 03/30/2021, 5:10 PM  [place]. [place], [place], [address] Phone: [phone]   Fax:  [phone]  Name: EXIE CHRISMER MRN: [id number] Date of Birth: Oct 25, 1932

## 2021-04-01 ENCOUNTER — Ambulatory Visit: Payer: Medicare Other | Admitting: Physical Therapy

## 2021-04-01 ENCOUNTER — Other Ambulatory Visit: Payer: Self-pay

## 2021-04-01 ENCOUNTER — Encounter: Payer: Self-pay | Admitting: Physical Therapy

## 2021-04-01 DIAGNOSIS — M6281 Muscle weakness (generalized): Secondary | ICD-10-CM

## 2021-04-01 DIAGNOSIS — M545 Low back pain, unspecified: Secondary | ICD-10-CM

## 2021-04-01 DIAGNOSIS — M62830 Muscle spasm of back: Secondary | ICD-10-CM

## 2021-04-01 DIAGNOSIS — M546 Pain in thoracic spine: Secondary | ICD-10-CM

## 2021-04-01 DIAGNOSIS — R262 Difficulty in walking, not elsewhere classified: Secondary | ICD-10-CM

## 2021-04-01 NOTE — Therapy (Signed)
[place]. [place], [place], [address] Phone: [phone]   Fax:  [phone]  Physical Therapy Treatment  Patient Details  Name: [name] MRN: [id number] Date of Birth: 1932/11/28 Referring Provider (PT): Elsner   Encounter Date: 04/01/2021   PT End of Session - 04/01/21 1656     Visit Number 18    Date for PT Re-Evaluation 04/08/21    PT Start Time 1610    PT Stop Time 1656    PT Time Calculation (min) 46 min    Activity Tolerance Patient tolerated treatment well    Behavior During Therapy [facility] for tasks assessed/performed             Past Medical History:  Diagnosis Date   Acalculous cholecystitis 08/24/2018   Arthritis    DVT of lower extremity (deep venous thrombosis) (HCC)    right leg - after back surgery in 2019   GERD (gastroesophageal reflux disease)    History of blood transfusion    Hypothyroidism    PONV (postoperative nausea and vomiting)    Ptosis of both eyelids    Vertigo    Wears glasses    reading    Past Surgical History:  Procedure Laterality Date   ABDOMINAL HYSTERECTOMY  5366   APPLICATION OF ROBOTIC ASSISTANCE FOR SPINAL PROCEDURE N/A 07/17/2017   Procedure: APPLICATION OF ROBOTIC ASSISTANCE FOR SPINAL PROCEDURE;  Surgeon: Kristeen Miss, MD;  Location: [place];  Service: Neurosurgery;  Laterality: N/A;   APPLICATION OF ROBOTIC ASSISTANCE FOR SPINAL PROCEDURE N/A 09/01/2017   Procedure: APPLICATION OF ROBOTIC ASSISTANCE FOR SPINAL PROCEDURE;  Surgeon: Kristeen Miss, MD;  Location: [place];  Service: Neurosurgery;  Laterality: N/A;   APPLICATION OF ROBOTIC ASSISTANCE FOR SPINAL PROCEDURE N/A 10/27/2020   Procedure: APPLICATION OF ROBOTIC ASSISTANCE FOR SPINAL PROCEDURE;  Surgeon: Kristeen Miss, MD;  Location: [place];  Service: Neurosurgery;  Laterality: N/A;   BACK SURGERY  1986   lumb lam   CARPAL TUNNEL RELEASE  2000   right   CARPAL TUNNEL RELEASE  2012   left    CHOLECYSTECTOMY N/A 08/24/2018   Procedure: LAPAROSCOPIC CHOLECYSTECTOMY WITH INTRAOPERATIVE CHOLANGIOGRAM;  Surgeon: Fanny Skates, MD;  Location: [facility] OR;  Service: General;  Laterality: N/A;   COLONOSCOPY     CYSTOCELE REPAIR  2007   sling   DISTAL INTERPHALANGEAL JOINT FUSION Right 02/04/2014   Procedure: FUSION DISTAL INTERPHALANGEAL JOINT RIGHT INDEX FINGER ;  Surgeon: Daryll Brod, MD;  Location: [place];  Service: Orthopedics;  Laterality: Right;   EYE SURGERY Bilateral    Cataract surgery with lens implant   KNEE SURGERY  2009,2011   partial knee   LAPAROSCOPY N/A 08/24/2018   Procedure: LAPAROSCOPY DIAGNOSTIC;  Surgeon: [place]anny Skates, MD;  Location: [place];  Service: General;  Laterality: N/A;   LUMBAR PERCUTANEOUS PEDICLE SCREW 4 LEVEL N/A 10/27/2020   Procedure: Thoracic nine-ten Laminectomy with extension of fusion from Thoracic ten to Thoracic six with segmental fixation (cemented) with pedicle screw and mazor;  Surgeon: Kristeen Miss, MD;  Location: [place];  Service: Neurosurgery;  Laterality: N/A;   OPEN REDUCTION INTERNAL FIXATION (ORIF) PROXIMAL PHALANX Left 08/30/2013   Procedure: OPEN REDUCTION INTERNAL FIXATION (ORIF) PROXIMAL PHALANX FRACTURE LEFT SMALL FINGER; SPLINT RING FINGER;  Surgeon: [place]onia Sours, MD;  Location: [place];  Service: Orthopedics;  Laterality: Left;   POSTERIOR LUMBAR FUSION 4 LEVEL N/A 07/17/2017   Procedure: Thoracic Ten -  Lumbar Five revison of hardware with Mazor;  Surgeon: Kristeen Miss, MD;  Location: [place];  Service: Neurosurgery;  Laterality: N/A;  Thoracic/Lumbar   PTOSIS REPAIR Bilateral 07/27/2015   Procedure: PTOSIS REPAIR;  Surgeon: Cristine Polio, MD;  Location: [place];  Service: Plastics;  Laterality: Bilateral;   SHOULDER SURGERY     rt rcr,and lt   TONSILLECTOMY     TOTAL KNEE ARTHROPLASTY Right 12/23/2019   Procedure: TOTAL KNEE ARTHROPLASTY;  Surgeon: Vickey Huger, MD;  [facility];  Service: Orthopedics;  Laterality: Right;    There were no vitals filed for this visit.   Subjective Assessment - 04/01/21 1612     Subjective Feeling pretty good    Currently in Pain? No/denies                [facility] PT Assessment - 04/01/21 0001       Timed Up and Go Test   Normal TUG (seconds) 15    TUG Comments with SPC                           [facility] Adult PT Treatment/Exercise - 04/01/21 0001       Ambulation/Gait   Gait Comments SPC 2x 120 feet      High Level Balance   High Level Balance Comments toe touches on cone with a SPC, beach ball toss, on airex holding banister marches , hip abduction and extension, side stepping over objects, side stepping, marching with 3# on each foot, cone touches with hands working on bending and reaching, rockerboard in [employer]      Knee/Hip Exercises: Aerobic   Recumbent Bike level 3.5 x 6 minutes    Nustep level 5 x 6 minutes      Knee/Hip Exercises: Machines for Strengthening   Cybex Knee Extension 10# 2x10    Cybex Knee Flexion 20# 3x10      Knee/Hip Exercises: Standing   Hip Flexion Both;2 sets;10 reps    Hip Flexion Limitations 3#    Hip Abduction 3 sets;10 reps;Both    Abduction Limitations 3#    Hip Extension 3 sets;10 reps;Both    Extension Limitations 3#      Knee/Hip Exercises: Seated   Other Seated Knee/Hip Exercises sit to stand no hands and then holding weighted ball    Other Seated Knee/Hip Exercises right ankle red tband exercises , calf and HS stretch                       PT Short Term Goals - 01/28/21 1846       PT SHORT TERM GOAL #1   Title independent with initial HEP    Status Partially Met               PT Long Term Goals - 04/01/21 1704       PT LONG TERM GOAL #1   Title decrease pain 50%    Status Partially Met      PT LONG TERM GOAL #2   Title increase lumbar ROM 25% with less pain    Status Achieved                   Plan -  04/01/21 1701     Clinical Impression Statement Patient much improved with TUG.  She cut her time in half.  We tried some higher level balance with weights on marching and side  stepping with HHA, also tried the rockerboard in the [place], she could not leg go and needed min A for balance    PT Next Visit Plan balance, stairs, function    Consulted and Agree with Plan of Care Patient             Patient will benefit from skilled therapeutic intervention in order to improve the following deficits and impairments:  Abnormal gait, Decreased range of motion, Decreased balance, Decreased scar mobility, Impaired flexibility, Decreased strength, Decreased mobility, Decreased endurance, Pain, Cardiopulmonary status limiting activity, Decreased activity tolerance, Postural dysfunction, Improper body mechanics, Increased muscle spasms, Difficulty walking  Visit Diagnosis: Acute bilateral low back pain without sciatica  Muscle spasm of back  Difficulty in walking, not elsewhere classified  Muscle weakness (generalized)  Pain in thoracic spine     Problem List Patient Active Problem List   Diagnosis Date Noted   Myelopathy concurrent with and due to spinal stenosis of thoracic region ([place]) 10/27/2020   S/P total knee arthroplasty, right 12/23/2019   Acalculous cholecystitis 08/24/2018   Osteoarthritis of right knee    Vertigo    DDD (degenerative disc disease), cervical    Benign essential HTN    History of DVT (deep vein thrombosis)    Tachycardia    Steroid-induced hyperglycemia    Hypothyroidism    Neuropathic pain    Acute blood loss anemia    S/P lumbar fusion    Closed L5 vertebral fracture (HCC) 08/31/2017   Closed fracture of fifth lumbar vertebra ([place]) 08/30/2017   Lumbar vertebral fracture ([place]) 07/17/2017   Scoliosis 06/13/2017    Sumner Boast, PT 04/01/2021, 5:04 PM  [place]. [place], [place], [address] Phone: [phone]   Fax:  [phone]  Name: [name] MRN: [id number] Date of Birth: May 07, 1932

## 2021-04-02 ENCOUNTER — Ambulatory Visit
Admission: RE | Admit: 2021-04-02 | Discharge: 2021-04-02 | Disposition: A | Discharge status: Home / Self Care | Payer: Medicare Other | Source: Ambulatory Visit | Attending: Internal Medicine | Admitting: Internal Medicine

## 2021-04-02 DIAGNOSIS — Z1231 Encounter for screening mammogram for malignant neoplasm of breast: Secondary | ICD-10-CM

## 2021-04-07 ENCOUNTER — Ambulatory Visit: Payer: Medicare Other | Admitting: Physical Therapy

## 2021-04-12 ENCOUNTER — Other Ambulatory Visit: Payer: Self-pay | Admitting: Orthopedic Surgery

## 2021-04-13 ENCOUNTER — Other Ambulatory Visit: Payer: Self-pay

## 2021-04-13 ENCOUNTER — Encounter: Payer: Self-pay | Admitting: Physical Therapy

## 2021-04-13 ENCOUNTER — Ambulatory Visit: Payer: Medicare Other | Attending: Neurological Surgery | Admitting: Physical Therapy

## 2021-04-13 DIAGNOSIS — M6281 Muscle weakness (generalized): Secondary | ICD-10-CM | POA: Diagnosis present

## 2021-04-13 DIAGNOSIS — M546 Pain in thoracic spine: Secondary | ICD-10-CM

## 2021-04-13 DIAGNOSIS — M545 Low back pain, unspecified: Secondary | ICD-10-CM

## 2021-04-13 DIAGNOSIS — M62830 Muscle spasm of back: Secondary | ICD-10-CM

## 2021-04-13 DIAGNOSIS — M25561 Pain in right knee: Secondary | ICD-10-CM | POA: Insufficient documentation

## 2021-04-13 DIAGNOSIS — R262 Difficulty in walking, not elsewhere classified: Secondary | ICD-10-CM

## 2021-04-13 NOTE — Therapy (Signed)
[place]. [place], [place], [address] Phone: [phone]   Fax:  [phone]  Physical Therapy Treatment  Patient Details  Name: [name] MRN: [id number] Date of Birth: 1932-07-01 Referring Provider (PT): Elsner   Encounter Date: 04/13/2021   PT End of Session - 04/13/21 1657     Visit Number 19    Date for PT Re-Evaluation 05/14/21    PT Start Time 1611    PT Stop Time 1655    PT Time Calculation (min) 44 min    Activity Tolerance Patient tolerated treatment well    Behavior During Therapy [facility] for tasks assessed/performed             Past Medical History:  Diagnosis Date   Acalculous cholecystitis 08/24/2018   Arthritis    DVT of lower extremity (deep venous thrombosis) (HCC)    right leg - after back surgery in 2019   GERD (gastroesophageal reflux disease)    History of blood transfusion    Hypothyroidism    PONV (postoperative nausea and vomiting)    Ptosis of both eyelids    Vertigo    Wears glasses    reading    Past Surgical History:  Procedure Laterality Date   ABDOMINAL HYSTERECTOMY  0174   APPLICATION OF ROBOTIC ASSISTANCE FOR SPINAL PROCEDURE N/A 07/17/2017   Procedure: APPLICATION OF ROBOTIC ASSISTANCE FOR SPINAL PROCEDURE;  Surgeon: Kristeen Miss, MD;  Location: [place];  Service: Neurosurgery;  Laterality: N/A;   APPLICATION OF ROBOTIC ASSISTANCE FOR SPINAL PROCEDURE N/A 09/01/2017   Procedure: APPLICATION OF ROBOTIC ASSISTANCE FOR SPINAL PROCEDURE;  Surgeon: Kristeen Miss, MD;  Location: [place];  Service: Neurosurgery;  Laterality: N/A;   APPLICATION OF ROBOTIC ASSISTANCE FOR SPINAL PROCEDURE N/A 10/27/2020   Procedure: APPLICATION OF ROBOTIC ASSISTANCE FOR SPINAL PROCEDURE;  Surgeon: Kristeen Miss, MD;  Location: [place];  Service: Neurosurgery;  Laterality: N/A;   BACK SURGERY  1986   lumb lam   CARPAL TUNNEL RELEASE  2000   right   CARPAL TUNNEL RELEASE  2012   left    CHOLECYSTECTOMY N/A 08/24/2018   Procedure: LAPAROSCOPIC CHOLECYSTECTOMY WITH INTRAOPERATIVE CHOLANGIOGRAM;  Surgeon: [place]anny Skates, MD;  Location: [facility] OR;  Service: General;  Laterality: N/A;   COLONOSCOPY     CYSTOCELE REPAIR  2007   sling   DISTAL INTERPHALANGEAL JOINT FUSION Right 02/04/2014   Procedure: FUSION DISTAL INTERPHALANGEAL JOINT RIGHT INDEX FINGER ;  Surgeon: Daryll Brod, MD;  Location: [place];  Service: Orthopedics;  Laterality: Right;   EYE SURGERY Bilateral    Cataract surgery with lens implant   KNEE SURGERY  2009,2011   partial knee   LAPAROSCOPY N/A 08/24/2018   Procedure: LAPAROSCOPY DIAGNOSTIC;  Surgeon: [place]anny Skates, MD;  Location: [place];  Service: General;  Laterality: N/A;   LUMBAR PERCUTANEOUS PEDICLE SCREW 4 LEVEL N/A 10/27/2020   Procedure: Thoracic nine-ten Laminectomy with extension of fusion from Thoracic ten to Thoracic six with segmental fixation (cemented) with pedicle screw and mazor;  Surgeon: Kristeen Miss, MD;  Location: [place];  Service: Neurosurgery;  Laterality: N/A;   OPEN REDUCTION INTERNAL FIXATION (ORIF) PROXIMAL PHALANX Left 08/30/2013   Procedure: OPEN REDUCTION INTERNAL FIXATION (ORIF) PROXIMAL PHALANX FRACTURE LEFT SMALL FINGER; SPLINT RING FINGER;  Surgeon: [place]onia Sours, MD;  Location: [place];  Service: Orthopedics;  Laterality: Left;   POSTERIOR LUMBAR FUSION 4 LEVEL N/A 07/17/2017   Procedure: Thoracic Ten -  Lumbar Five revison of hardware with Mazor;  Surgeon: Kristeen Miss, MD;  Location: [place];  Service: Neurosurgery;  Laterality: N/A;  Thoracic/Lumbar   PTOSIS REPAIR Bilateral 07/27/2015   Procedure: PTOSIS REPAIR;  Surgeon: Cristine Polio, MD;  Location: [place];  Service: Plastics;  Laterality: Bilateral;   SHOULDER SURGERY     rt rcr,and lt   TONSILLECTOMY     TOTAL KNEE ARTHROPLASTY Right 12/23/2019   Procedure: TOTAL KNEE ARTHROPLASTY;  Surgeon: Vickey Huger, MD;  [facility];  Service: Orthopedics;  Laterality: Right;    There were no vitals filed for this visit.   Subjective Assessment - 04/13/21 1623     Subjective working on walking at home with a SPC, reports that she feels that she is doing well. but still fearful of walking, she does c/o left low back and rib  flank pain    Currently in Pain? Yes    Pain Score 3     Pain Location Rib cage    Pain Orientation Left    Pain Descriptors / Indicators Sore    Aggravating Factors  touch hurts                               [facility] Adult PT Treatment/Exercise - 04/13/21 0001       Ambulation/Gait   Gait Comments with SPC 200 feet x 2, tends to be a little unsafe and will grab walls if close      Knee/Hip Exercises: Aerobic   Recumbent Bike level 3.5 x 6 minutes    Nustep level 5 x 6 minutes      Knee/Hip Exercises: Machines for Strengthening   Cybex Knee Extension 10# 2x10    Cybex Knee Flexion 25# 3x10      Knee/Hip Exercises: Standing   Hip Flexion Both;2 sets;10 reps    Hip Flexion Limitations 3#    Hip Abduction 3 sets;10 reps;Both    Abduction Limitations 3#    Hip Extension 3 sets;10 reps;Both    Extension Limitations 3#      Knee/Hip Exercises: Seated   Other Seated Knee/Hip Exercises sit to stand no hands and then holding weighted ball    Other Seated Knee/Hip Exercises right ankle red tband exercises , calf and HS stretch, DF is wihtout band                       PT Short Term Goals - 01/28/21 1846       PT SHORT TERM GOAL #1   Title independent with initial HEP    Status Partially Met               PT Long Term Goals - 04/13/21 1659       PT LONG TERM GOAL #1   Title decrease pain 50%    Status Partially Met      PT LONG TERM GOAL #2   Title increase lumbar ROM 25% with less pain    Status Achieved      PT LONG TERM GOAL #3   Title increase LE strength to 4+/5    Status Partially Met      PT LONG TERM GOAL #4   Title go  up and down stairs step over step    Status Partially Met      PT LONG TERM GOAL #5   Title decrease TUG to 18 seconds  Status Partially Met                   Plan - 04/13/21 1658     Clinical Impression Statement Overall patient is doing better, less difficulty walking and getting up from sitting, she feels that her hips are getting stronger.  She is able to walk the 200 feet without rest, with much less trendelenberg pattern.  The right ankle DF is her weakest area and eversion is also weak.  She does not like or trust herself on the airex/dynamic surfaces.  She continues to have left rib and flank pain, very tender to touch but otherwise no pain    PT Frequency 2x / week    PT Duration 6 weeks    PT Treatment/Interventions ADLs/Self Care Home Management;Cryotherapy;Gait training;Neuromuscular re-education;Balance training;Therapeutic exercise;Therapeutic activities;Functional mobility training;Stair training;Patient/family education;Manual techniques;Ultrasound;Moist Heat;Electrical Stimulation    PT Next Visit Plan balance, stairs, function    Consulted and Agree with Plan of Care Patient             Patient will benefit from skilled therapeutic intervention in order to improve the following deficits and impairments:  Abnormal gait, Decreased range of motion, Decreased balance, Decreased scar mobility, Impaired flexibility, Decreased strength, Decreased mobility, Decreased endurance, Pain, Cardiopulmonary status limiting activity, Decreased activity tolerance, Postural dysfunction, Improper body mechanics, Increased muscle spasms, Difficulty walking  Visit Diagnosis: Acute bilateral low back pain without sciatica - Plan: PT plan of care cert/re-cert  Muscle spasm of back - Plan: PT plan of care cert/re-cert  Difficulty in walking, not elsewhere classified - Plan: PT plan of care cert/re-cert  Muscle weakness (generalized) - Plan: PT plan of care cert/re-cert  Pain in  thoracic spine - Plan: PT plan of care cert/re-cert     Problem List Patient Active Problem List   Diagnosis Date Noted   Myelopathy concurrent with and due to spinal stenosis of thoracic region ([place]) 10/27/2020   S/P total knee arthroplasty, right 12/23/2019   Acalculous cholecystitis 08/24/2018   Osteoarthritis of right knee    Vertigo    DDD (degenerative disc disease), cervical    Benign essential HTN    History of DVT (deep vein thrombosis)    Tachycardia    Steroid-induced hyperglycemia    Hypothyroidism    Neuropathic pain    Acute blood loss anemia    S/P lumbar fusion    Closed L5 vertebral fracture (HCC) 08/31/2017   Closed fracture of fifth lumbar vertebra ([place]) 08/30/2017   Lumbar vertebral fracture ([place]) 07/17/2017   Scoliosis 06/13/2017    Sumner Boast, PT 04/13/2021, 5:01 PM  [place]. [place], [place], [address] Phone: [phone]   Fax:  [phone]  Name: [name] MRN: [id number] Date of Birth: [birth year]

## 2021-04-15 ENCOUNTER — Other Ambulatory Visit: Payer: Self-pay

## 2021-04-15 ENCOUNTER — Ambulatory Visit: Payer: Medicare Other | Admitting: Physical Therapy

## 2021-04-15 DIAGNOSIS — M6281 Muscle weakness (generalized): Secondary | ICD-10-CM

## 2021-04-15 DIAGNOSIS — M545 Low back pain, unspecified: Secondary | ICD-10-CM | POA: Diagnosis not present

## 2021-04-15 DIAGNOSIS — R262 Difficulty in walking, not elsewhere classified: Secondary | ICD-10-CM

## 2021-04-15 DIAGNOSIS — M62830 Muscle spasm of back: Secondary | ICD-10-CM

## 2021-04-15 NOTE — Therapy (Signed)
[place]. [place], [place], [address] Phone: [phone]   Fax:  [phone]  Physical Therapy Treatment  Patient Details  Name: [name] MRN: [id number] Date of Birth: 1932/06/04 Referring Provider (PT): Elsner   Encounter Date: 04/15/2021   PT End of Session - 04/15/21 1638     Visit Number 20    Date for PT Re-Evaluation 05/14/21    PT Start Time 1600    PT Stop Time 1645    PT Time Calculation (min) 45 min    Activity Tolerance Patient tolerated treatment well    Behavior During Therapy [facility] for tasks assessed/performed             Past Medical History:  Diagnosis Date   Acalculous cholecystitis 08/24/2018   Arthritis    DVT of lower extremity (deep venous thrombosis) (HCC)    right leg - after back surgery in 2019   GERD (gastroesophageal reflux disease)    History of blood transfusion    Hypothyroidism    PONV (postoperative nausea and vomiting)    Ptosis of both eyelids    Vertigo    Wears glasses    reading    Past Surgical History:  Procedure Laterality Date   ABDOMINAL HYSTERECTOMY  0141   APPLICATION OF ROBOTIC ASSISTANCE FOR SPINAL PROCEDURE N/A 07/17/2017   Procedure: APPLICATION OF ROBOTIC ASSISTANCE FOR SPINAL PROCEDURE;  Surgeon: Kristeen Miss, MD;  Location: [place];  Service: Neurosurgery;  Laterality: N/A;   APPLICATION OF ROBOTIC ASSISTANCE FOR SPINAL PROCEDURE N/A 09/01/2017   Procedure: APPLICATION OF ROBOTIC ASSISTANCE FOR SPINAL PROCEDURE;  Surgeon: Kristeen Miss, MD;  Location: [place];  Service: Neurosurgery;  Laterality: N/A;   APPLICATION OF ROBOTIC ASSISTANCE FOR SPINAL PROCEDURE N/A 10/27/2020   Procedure: APPLICATION OF ROBOTIC ASSISTANCE FOR SPINAL PROCEDURE;  Surgeon: Kristeen Miss, MD;  Location: [place];  Service: Neurosurgery;  Laterality: N/A;   BACK SURGERY  1986   lumb lam   CARPAL TUNNEL RELEASE  2000   right   CARPAL TUNNEL RELEASE  2012   left    CHOLECYSTECTOMY N/A 08/24/2018   Procedure: LAPAROSCOPIC CHOLECYSTECTOMY WITH INTRAOPERATIVE CHOLANGIOGRAM;  Surgeon: Fanny Skates, MD;  [facility];  Service: General;  Laterality: N/A;   COLONOSCOPY     CYSTOCELE REPAIR  2007   sling   DISTAL INTERPHALANGEAL JOINT FUSION Right 02/04/2014   Procedure: FUSION DISTAL INTERPHALANGEAL JOINT RIGHT INDEX FINGER ;  Surgeon: Daryll Brod, MD;  Location: [place];  Service: Orthopedics;  Laterality: Right;   EYE SURGERY Bilateral    Cataract surgery with lens implant   KNEE SURGERY  2009,2011   partial knee   LAPAROSCOPY N/A 08/24/2018   Procedure: LAPAROSCOPY DIAGNOSTIC;  Surgeon: Fanny Skates, MD;  Location: [place];  Service: General;  Laterality: N/A;   LUMBAR PERCUTANEOUS PEDICLE SCREW 4 LEVEL N/A 10/27/2020   Procedure: Thoracic nine-ten Laminectomy with extension of fusion from Thoracic ten to Thoracic six with segmental fixation (cemented) with pedicle screw and mazor;  Surgeon: Kristeen Miss, MD;  Location: [place];  Service: Neurosurgery;  Laterality: N/A;   OPEN REDUCTION INTERNAL FIXATION (ORIF) PROXIMAL PHALANX Left 08/30/2013   Procedure: OPEN REDUCTION INTERNAL FIXATION (ORIF) PROXIMAL PHALANX FRACTURE LEFT SMALL FINGER; SPLINT RING FINGER;  Surgeon: Wynonia Sours, MD;  Location: [place];  Service: Orthopedics;  Laterality: Left;   POSTERIOR LUMBAR FUSION 4 LEVEL N/A 07/17/2017   Procedure: Thoracic Ten -  Lumbar Five revison of hardware with Mazor;  Surgeon: Kristeen Miss, MD;  Location: [place];  Service: Neurosurgery;  Laterality: N/A;  Thoracic/Lumbar   PTOSIS REPAIR Bilateral 07/27/2015   Procedure: PTOSIS REPAIR;  Surgeon: Cristine Polio, MD;  Location: [place];  Service: Plastics;  Laterality: Bilateral;   SHOULDER SURGERY     rt rcr,and lt   TONSILLECTOMY     TOTAL KNEE ARTHROPLASTY Right 12/23/2019   Procedure: TOTAL KNEE ARTHROPLASTY;  Surgeon: Vickey Huger, MD;  [facility];  Service: Orthopedics;  Laterality: Right;    There were no vitals filed for this visit.   Subjective Assessment - 04/15/21 1607     Subjective Feeling good today, no pain today I  took two tramadol    Currently in Pain? No/denies                               [facility] Adult PT Treatment/Exercise - 04/15/21 0001       Ambulation/Gait   Gait Comments with SPC 200 feet x 2, tends to be a little unsteady      Knee/Hip Exercises: Aerobic   Recumbent Bike level 3.5 x 5 minutes    Nustep level 5 x 6 minutes      Knee/Hip Exercises: Machines for Strengthening   Cybex Knee Extension 10# 2x10    Cybex Knee Flexion 25# 3x10      Knee/Hip Exercises: Standing   Hip Flexion Both;2 sets;10 reps    Hip Flexion Limitations 3#    Hip Abduction 10 reps;Both;2 sets    Abduction Limitations 3#    Hip Extension 10 reps;Both;2 sets    Extension Limitations 3#      Knee/Hip Exercises: Seated   Other Seated Knee/Hip Exercises sit to stand no hands and then holding weighted ball    Other Seated Knee/Hip Exercises right ankle red tband exercises , calf and HS stretch, DF is wihtout band                       PT Short Term Goals - 04/15/21 1638       PT SHORT TERM GOAL #1   Title independent with initial HEP    Status Achieved               PT Long Term Goals - 04/15/21 1607       PT LONG TERM GOAL #1   Title decrease pain 50%    Status Partially Met      PT LONG TERM GOAL #2   Title increase lumbar ROM 25% with less pain    Status Achieved      PT LONG TERM GOAL #3   Title increase LE strength to 4+/5    Status Partially Met      PT LONG TERM GOAL #4   Title go up and down stairs step over step    Status Partially Met      PT LONG TERM GOAL #5   Title decrease TUG to 18 seconds    Status Partially Met                   Plan - 04/15/21 1638     Clinical Impression Statement Pt continues to do well overall. She has  improved reciprocal gait pattern with SPC.  Cue for pacing needed with standing hip interventions, pt tends to complete the reps too  fast. Tactile cue needed for pt to come to full standing with sit to stands. R ankle DF remains her weakest area.    Stability/Clinical Decision Making Evolving/Moderate complexity    Rehab Potential Good    PT Frequency 2x / week    PT Duration 6 weeks    PT Treatment/Interventions ADLs/Self Care Home Management;Cryotherapy;Gait training;Neuromuscular re-education;Balance training;Therapeutic exercise;Therapeutic activities;Functional mobility training;Stair training;Patient/family education;Manual techniques;Ultrasound;Moist Heat;Electrical Stimulation    PT Next Visit Plan balance, stairs, function             Patient will benefit from skilled therapeutic intervention in order to improve the following deficits and impairments:  Abnormal gait, Decreased range of motion, Decreased balance, Decreased scar mobility, Impaired flexibility, Decreased strength, Decreased mobility, Decreased endurance, Pain, Cardiopulmonary status limiting activity, Decreased activity tolerance, Postural dysfunction, Improper body mechanics, Increased muscle spasms, Difficulty walking  Visit Diagnosis: Acute bilateral low back pain without sciatica  Difficulty in walking, not elsewhere classified  Muscle weakness (generalized)  Muscle spasm of back     Problem List Patient Active Problem List   Diagnosis Date Noted   Myelopathy concurrent with and due to spinal stenosis of thoracic region ([place]) 10/27/2020   S/P total knee arthroplasty, right 12/23/2019   Acalculous cholecystitis 08/24/2018   Osteoarthritis of right knee    Vertigo    DDD (degenerative disc disease), cervical    Benign essential HTN    History of DVT (deep vein thrombosis)    Tachycardia    Steroid-induced hyperglycemia    Hypothyroidism    Neuropathic pain    Acute blood loss anemia    S/P lumbar  fusion    Closed L5 vertebral fracture (HCC) 08/31/2017   Closed fracture of fifth lumbar vertebra ([place]) 08/30/2017   Lumbar vertebral fracture ([place]) 07/17/2017   Scoliosis 06/13/2017    Scot Jun, PTA 04/15/2021, 4:43 PM  [place]. [place], [place], [address] Phone: [phone]   Fax:  [phone]  Name: [name] MRN: [id number] Date of Birth: Apr 20, 1932

## 2021-04-20 ENCOUNTER — Encounter: Payer: Self-pay | Admitting: Physical Therapy

## 2021-04-20 ENCOUNTER — Other Ambulatory Visit: Payer: Self-pay

## 2021-04-20 ENCOUNTER — Ambulatory Visit: Payer: Medicare Other | Admitting: Physical Therapy

## 2021-04-20 DIAGNOSIS — M6281 Muscle weakness (generalized): Secondary | ICD-10-CM

## 2021-04-20 DIAGNOSIS — M545 Low back pain, unspecified: Secondary | ICD-10-CM

## 2021-04-20 DIAGNOSIS — R262 Difficulty in walking, not elsewhere classified: Secondary | ICD-10-CM

## 2021-04-20 NOTE — Therapy (Signed)
[place]. [place], [place], [address] Phone: [phone]   Fax:  [phone]  Physical Therapy Treatment  Patient Details  Name: [name] MRN: [id number] Date of Birth: 08-Feb-1933 Referring Provider (PT): Elsner   Encounter Date: 04/20/2021   PT End of Session - 04/20/21 6283     Visit Number 21    Date for PT Re-Evaluation 05/14/21    PT Start Time 1552    PT Stop Time 1639    PT Time Calculation (min) 47 min    Activity Tolerance Patient tolerated treatment well    Behavior During Therapy [facility] for tasks assessed/performed             Past Medical History:  Diagnosis Date   Acalculous cholecystitis 08/24/2018   Arthritis    DVT of lower extremity (deep venous thrombosis) (HCC)    right leg - after back surgery in 2019   GERD (gastroesophageal reflux disease)    History of blood transfusion    Hypothyroidism    PONV (postoperative nausea and vomiting)    Ptosis of both eyelids    Vertigo    Wears glasses    reading    Past Surgical History:  Procedure Laterality Date   ABDOMINAL HYSTERECTOMY  1517   APPLICATION OF ROBOTIC ASSISTANCE FOR SPINAL PROCEDURE N/A 07/17/2017   Procedure: APPLICATION OF ROBOTIC ASSISTANCE FOR SPINAL PROCEDURE;  Surgeon: Kristeen Miss, MD;  Location: [place];  Service: Neurosurgery;  Laterality: N/A;   APPLICATION OF ROBOTIC ASSISTANCE FOR SPINAL PROCEDURE N/A 09/01/2017   Procedure: APPLICATION OF ROBOTIC ASSISTANCE FOR SPINAL PROCEDURE;  Surgeon: Kristeen Miss, MD;  Location: [place];  Service: Neurosurgery;  Laterality: N/A;   APPLICATION OF ROBOTIC ASSISTANCE FOR SPINAL PROCEDURE N/A 10/27/2020   Procedure: APPLICATION OF ROBOTIC ASSISTANCE FOR SPINAL PROCEDURE;  Surgeon: Kristeen Miss, MD;  Location: [place];  Service: Neurosurgery;  Laterality: N/A;   BACK SURGERY  1986   lumb lam   CARPAL TUNNEL RELEASE  2000   right   CARPAL TUNNEL RELEASE  2012   left    CHOLECYSTECTOMY N/A 08/24/2018   Procedure: LAPAROSCOPIC CHOLECYSTECTOMY WITH INTRAOPERATIVE CHOLANGIOGRAM;  Surgeon: Fanny Skates, MD;  [facility];  Service: General;  Laterality: N/A;   COLONOSCOPY     CYSTOCELE REPAIR  2007   sling   DISTAL INTERPHALANGEAL JOINT FUSION Right 02/04/2014   Procedure: FUSION DISTAL INTERPHALANGEAL JOINT RIGHT INDEX FINGER ;  Surgeon: Daryll Brod, MD;  Location: [place];  Service: Orthopedics;  Laterality: Right;   EYE SURGERY Bilateral    Cataract surgery with lens implant   KNEE SURGERY  2009,2011   partial knee   LAPAROSCOPY N/A 08/24/2018   Procedure: LAPAROSCOPY DIAGNOSTIC;  Surgeon: Fanny Skates, MD;  Location: [place];  Service: General;  Laterality: N/A;   LUMBAR PERCUTANEOUS PEDICLE SCREW 4 LEVEL N/A 10/27/2020   Procedure: Thoracic nine-ten Laminectomy with extension of fusion from Thoracic ten to Thoracic six with segmental fixation (cemented) with pedicle screw and mazor;  Surgeon: Kristeen Miss, MD;  Location: [place];  Service: Neurosurgery;  Laterality: N/A;   OPEN REDUCTION INTERNAL FIXATION (ORIF) PROXIMAL PHALANX Left 08/30/2013   Procedure: OPEN REDUCTION INTERNAL FIXATION (ORIF) PROXIMAL PHALANX FRACTURE LEFT SMALL FINGER; SPLINT RING FINGER;  Surgeon: Wynonia Sours, MD;  Location: [place];  Service: Orthopedics;  Laterality: Left;   POSTERIOR LUMBAR FUSION 4 LEVEL N/A 07/17/2017   Procedure: Thoracic Ten -  Lumbar Five revison of hardware with Mazor;  Surgeon: Kristeen Miss, MD;  Location: [place];  Service: Neurosurgery;  Laterality: N/A;  Thoracic/Lumbar   PTOSIS REPAIR Bilateral 07/27/2015   Procedure: PTOSIS REPAIR;  Surgeon: Cristine Polio, MD;  Location: [place];  Service: Plastics;  Laterality: Bilateral;   SHOULDER SURGERY     rt rcr,and lt   TONSILLECTOMY     TOTAL KNEE ARTHROPLASTY Right 12/23/2019   Procedure: TOTAL KNEE ARTHROPLASTY;  Surgeon: Vickey Huger, MD;  [facility];  Service: Orthopedics;  Laterality: Right;    There were no vitals filed for this visit.   Subjective Assessment - 04/20/21 1551     Subjective "I just haven't felt good today"    Currently in Pain? No/denies                               [facility] Adult PT Treatment/Exercise - 04/20/21 0001       Ambulation/Gait   Stairs Yes    Stairs Assistance 5: Supervision    Stair Management Technique Two rails;Alternating pattern    Number of Stairs 15    Height of Stairs 6   &3   Gait Comments with SPC 250, 200 feet x 2, tends to be a little unsteady      High Level Balance   High Level Balance Activities Side stepping   Light HHA     Knee/Hip Exercises: Aerobic   Recumbent Bike level 3.5 x 5 minutes    Nustep level 5 x 5 minutes      Knee/Hip Exercises: Standing   Hip Flexion Both;2 sets;10 reps   IN RW 8in box   Hip Flexion Limitations 3#    Other Standing Knee Exercises Alt 6in bax taps SPC 2x10      Knee/Hip Exercises: Seated   Other Seated Knee/Hip Exercises sit to stand no hands and then holding weighted ball    Other Seated Knee/Hip Exercises right ankle red tband exercises , calf and HS stretch, DF is wihtout band                       PT Short Term Goals - 04/15/21 1638       PT SHORT TERM GOAL #1   Title independent with initial HEP    Status Achieved               PT Long Term Goals - 04/15/21 1607       PT LONG TERM GOAL #1   Title decrease pain 50%    Status Partially Met      PT LONG TERM GOAL #2   Title increase lumbar ROM 25% with less pain    Status Achieved      PT LONG TERM GOAL #3   Title increase LE strength to 4+/5    Status Partially Met      PT LONG TERM GOAL #4   Title go up and down stairs step over step    Status Partially Met      PT LONG TERM GOAL #5   Title decrease TUG to 18 seconds    Status Partially Met                   Plan - 04/20/21 1640     Clinical Impression  Statement Despite not feeling pt able to complete all interventions. Cue for pacing needed  with sit to stands, and to come to full standing. Some instability present with alt box taps using SPC. Attempted side steps in front of mat without hand held support and was able to take a few steps, but ultimately required HHA. Pt able increase her single trials gait distance without issue.    Stability/Clinical Decision Making Evolving/Moderate complexity    Rehab Potential Good    PT Frequency 2x / week    PT Duration 6 weeks    PT Treatment/Interventions ADLs/Self Care Home Management;Cryotherapy;Gait training;Neuromuscular re-education;Balance training;Therapeutic exercise;Therapeutic activities;Functional mobility training;Stair training;Patient/family education;Manual techniques;Ultrasound;Moist Heat;Electrical Stimulation    PT Next Visit Plan balance, stairs, function             Patient will benefit from skilled therapeutic intervention in order to improve the following deficits and impairments:  Abnormal gait, Decreased range of motion, Decreased balance, Decreased scar mobility, Impaired flexibility, Decreased strength, Decreased mobility, Decreased endurance, Pain, Cardiopulmonary status limiting activity, Decreased activity tolerance, Postural dysfunction, Improper body mechanics, Increased muscle spasms, Difficulty walking  Visit Diagnosis: Acute bilateral low back pain without sciatica  Difficulty in walking, not elsewhere classified  Muscle weakness (generalized)     Problem List Patient Active Problem List   Diagnosis Date Noted   Myelopathy concurrent with and due to spinal stenosis of thoracic region ([place]) 10/27/2020   S/P total knee arthroplasty, right 12/23/2019   Acalculous cholecystitis 08/24/2018   Osteoarthritis of right knee    Vertigo    DDD (degenerative disc disease), cervical    Benign essential HTN    History of DVT (deep vein thrombosis)    Tachycardia     Steroid-induced hyperglycemia    Hypothyroidism    Neuropathic pain    Acute blood loss anemia    S/P lumbar fusion    Closed L5 vertebral fracture (HCC) 08/31/2017   Closed fracture of fifth lumbar vertebra ([place]) 08/30/2017   Lumbar vertebral fracture ([place]) 07/17/2017   Scoliosis 06/13/2017    Scot Jun, PTA 04/20/2021, 4:46 PM  [place]. [place], [place], [address] Phone: [phone]   Fax:  [phone]  Name: [name] MRN: [id number] Date of Birth: [birth year]

## 2021-04-22 ENCOUNTER — Other Ambulatory Visit: Payer: Self-pay

## 2021-04-22 ENCOUNTER — Encounter: Payer: Self-pay | Admitting: Physical Therapy

## 2021-04-22 ENCOUNTER — Ambulatory Visit: Payer: Medicare Other | Admitting: Physical Therapy

## 2021-04-22 DIAGNOSIS — M545 Low back pain, unspecified: Secondary | ICD-10-CM | POA: Diagnosis not present

## 2021-04-22 DIAGNOSIS — M62830 Muscle spasm of back: Secondary | ICD-10-CM

## 2021-04-22 DIAGNOSIS — M6281 Muscle weakness (generalized): Secondary | ICD-10-CM

## 2021-04-22 DIAGNOSIS — R262 Difficulty in walking, not elsewhere classified: Secondary | ICD-10-CM

## 2021-04-22 NOTE — Therapy (Signed)
[place]. [place], [place], [address] Phone: [phone]   Fax:  [phone]  Physical Therapy Treatment  Patient Details  Name: [name] MRN: [id number] Date of Birth: 03/04/1933 Referring Provider (PT): Elsner   Encounter Date: 04/22/2021   PT End of Session - 04/22/21 1643     Visit Number 22    Date for PT Re-Evaluation 05/14/21    PT Start Time 1600    PT Stop Time 1643    PT Time Calculation (min) 43 min    Activity Tolerance Patient tolerated treatment well    Behavior During Therapy [facility] for tasks assessed/performed             Past Medical History:  Diagnosis Date   Acalculous cholecystitis 08/24/2018   Arthritis    DVT of lower extremity (deep venous thrombosis) (HCC)    right leg - after back surgery in 2019   GERD (gastroesophageal reflux disease)    History of blood transfusion    Hypothyroidism    PONV (postoperative nausea and vomiting)    Ptosis of both eyelids    Vertigo    Wears glasses    reading    Past Surgical History:  Procedure Laterality Date   ABDOMINAL HYSTERECTOMY  6967   APPLICATION OF ROBOTIC ASSISTANCE FOR SPINAL PROCEDURE N/A 07/17/2017   Procedure: APPLICATION OF ROBOTIC ASSISTANCE FOR SPINAL PROCEDURE;  Surgeon: Kristeen Miss, MD;  Location: [place];  Service: Neurosurgery;  Laterality: N/A;   APPLICATION OF ROBOTIC ASSISTANCE FOR SPINAL PROCEDURE N/A 09/01/2017   Procedure: APPLICATION OF ROBOTIC ASSISTANCE FOR SPINAL PROCEDURE;  Surgeon: Kristeen Miss, MD;  Location: [place];  Service: Neurosurgery;  Laterality: N/A;   APPLICATION OF ROBOTIC ASSISTANCE FOR SPINAL PROCEDURE N/A 10/27/2020   Procedure: APPLICATION OF ROBOTIC ASSISTANCE FOR SPINAL PROCEDURE;  Surgeon: Kristeen Miss, MD;  Location: [place];  Service: Neurosurgery;  Laterality: N/A;   BACK SURGERY  1986   lumb lam   CARPAL TUNNEL RELEASE  2000   right   CARPAL TUNNEL RELEASE  2012   left    CHOLECYSTECTOMY N/A 08/24/2018   Procedure: LAPAROSCOPIC CHOLECYSTECTOMY WITH INTRAOPERATIVE CHOLANGIOGRAM;  Surgeon: Fanny Skates, MD;  Location: [facility] OR;  Service: General;  Laterality: N/A;   COLONOSCOPY     CYSTOCELE REPAIR  2007   sling   DISTAL INTERPHALANGEAL JOINT FUSION Right 02/04/2014   Procedure: FUSION DISTAL INTERPHALANGEAL JOINT RIGHT INDEX FINGER ;  Surgeon: Daryll Brod, MD;  Location: [place];  Service: Orthopedics;  Laterality: Right;   EYE SURGERY Bilateral    Cataract surgery with lens implant   KNEE SURGERY  2009,2011   partial knee   LAPAROSCOPY N/A 08/24/2018   Procedure: LAPAROSCOPY DIAGNOSTIC;  Surgeon: Fanny Skates, MD;  Location: [place];  Service: General;  Laterality: N/A;   LUMBAR PERCUTANEOUS PEDICLE SCREW 4 LEVEL N/A 10/27/2020   Procedure: Thoracic nine-ten Laminectomy with extension of fusion from Thoracic ten to Thoracic six with segmental fixation (cemented) with pedicle screw and mazor;  Surgeon: Kristeen Miss, MD;  Location: [place];  Service: Neurosurgery;  Laterality: N/A;   OPEN REDUCTION INTERNAL FIXATION (ORIF) PROXIMAL PHALANX Left 08/30/2013   Procedure: OPEN REDUCTION INTERNAL FIXATION (ORIF) PROXIMAL PHALANX FRACTURE LEFT SMALL FINGER; SPLINT RING FINGER;  Surgeon: Wynonia Sours, MD;  Location: [place];  Service: Orthopedics;  Laterality: Left;   POSTERIOR LUMBAR FUSION 4 LEVEL N/A 07/17/2017   Procedure: Thoracic Ten -  Lumbar Five revison of hardware with Mazor;  Surgeon: Kristeen Miss, MD;  Location: [place];  Service: Neurosurgery;  Laterality: N/A;  Thoracic/Lumbar   PTOSIS REPAIR Bilateral 07/27/2015   Procedure: PTOSIS REPAIR;  Surgeon: Cristine Polio, MD;  Location: [place];  Service: Plastics;  Laterality: Bilateral;   SHOULDER SURGERY     rt rcr,and lt   TONSILLECTOMY     TOTAL KNEE ARTHROPLASTY Right 12/23/2019   Procedure: TOTAL KNEE ARTHROPLASTY;  Surgeon: Vickey Huger, MD;  [facility];  Service: Orthopedics;  Laterality: Right;    There were no vitals filed for this visit.   Subjective Assessment - 04/22/21 1604     Subjective "I feel ok"    Currently in Pain? No/denies                               [facility] Adult PT Treatment/Exercise - 04/22/21 0001       Ambulation/Gait   Gait Comments with SPC 200 feet x 2, tends to be a little unsteady, Two moe tires 29f without AD      High Level Balance   High Level Balance Comments Standing ball toss      Knee/Hip Exercises: Aerobic   Recumbent Bike level 3.5 x 5 minutes    Nustep level 5 x 6 minutes      Knee/Hip Exercises: Machines for Strengthening   Cybex Knee Flexion 35lb 2x10      Knee/Hip Exercises: Standing   Other Standing Knee Exercises Alt 8in box taps SPC x10 x2   1lb ankle weight for 2nd set     Knee/Hip Exercises: Seated   Sit to Sand 3 sets;5 reps;without UE support   On airex, elevated mat                      PT Short Term Goals - 04/15/21 1638       PT SHORT TERM GOAL #1   Title independent with initial HEP    Status Achieved               PT Long Term Goals - 04/15/21 1607       PT LONG TERM GOAL #1   Title decrease pain 50%    Status Partially Met      PT LONG TERM GOAL #2   Title increase lumbar ROM 25% with less pain    Status Achieved      PT LONG TERM GOAL #3   Title increase LE strength to 4+/5    Status Partially Met      PT LONG TERM GOAL #4   Title go up and down stairs step over step    Status Partially Met      PT LONG TERM GOAL #5   Title decrease TUG to 18 seconds    Status Partially Met                   Plan - 04/22/21 1645     Clinical Impression Statement Pt able to progress to ambulation without AD with SBA. Pt had difficulty maintaining stability during the standing phase of sit to stands. Increase resistance tolerated with seated hamstring curls. Eight inch alternating box tap were difficult for pt  requiring CGA at ties to complete.    Stability/Clinical Decision Making Evolving/Moderate complexity    Rehab Potential Good    PT Frequency 2x / week  PT Duration 6 weeks    PT Treatment/Interventions ADLs/Self Care Home Management;Cryotherapy;Gait training;Neuromuscular re-education;Balance training;Therapeutic exercise;Therapeutic activities;Functional mobility training;Stair training;Patient/family education;Manual techniques;Ultrasound;Moist Heat;Electrical Stimulation    PT Next Visit Plan balance, stairs, function             Patient will benefit from skilled therapeutic intervention in order to improve the following deficits and impairments:  Abnormal gait, Decreased range of motion, Decreased balance, Decreased scar mobility, Impaired flexibility, Decreased strength, Decreased mobility, Decreased endurance, Pain, Cardiopulmonary status limiting activity, Decreased activity tolerance, Postural dysfunction, Improper body mechanics, Increased muscle spasms, Difficulty walking  Visit Diagnosis: Acute bilateral low back pain without sciatica  Difficulty in walking, not elsewhere classified  Muscle weakness (generalized)  Muscle spasm of back     Problem List Patient Active Problem List   Diagnosis Date Noted   Myelopathy concurrent with and due to spinal stenosis of thoracic region ([place]) 10/27/2020   S/P total knee arthroplasty, right 12/23/2019   Acalculous cholecystitis 08/24/2018   Osteoarthritis of right knee    Vertigo    DDD (degenerative disc disease), cervical    Benign essential HTN    History of DVT (deep vein thrombosis)    Tachycardia    Steroid-induced hyperglycemia    Hypothyroidism    Neuropathic pain    Acute blood loss anemia    S/P lumbar fusion    Closed L5 vertebral fracture (HCC) 08/31/2017   Closed fracture of fifth lumbar vertebra ([place]) 08/30/2017   Lumbar vertebral fracture ([place]) 07/17/2017   Scoliosis 06/13/2017    Scot Jun,  PTA 04/22/2021, 4:49 PM  [place]. [place], [place], [address] Phone: [phone]   Fax:  [phone]  Name: [name] MRN: [id number] Date of Birth: December 20, 1932

## 2021-04-26 ENCOUNTER — Ambulatory Visit
Admission: RE | Admit: 2021-04-26 | Discharge: 2021-04-26 | Disposition: A | Discharge status: Home / Self Care | Payer: Medicare Other | Source: Ambulatory Visit | Attending: Internal Medicine | Admitting: Internal Medicine

## 2021-04-26 ENCOUNTER — Other Ambulatory Visit: Payer: Self-pay | Admitting: Internal Medicine

## 2021-04-26 DIAGNOSIS — R0781 Pleurodynia: Secondary | ICD-10-CM

## 2021-04-27 ENCOUNTER — Encounter: Payer: Self-pay | Admitting: Physical Therapy

## 2021-04-27 ENCOUNTER — Other Ambulatory Visit: Payer: Self-pay

## 2021-04-27 ENCOUNTER — Ambulatory Visit: Payer: Medicare Other | Admitting: Physical Therapy

## 2021-04-27 DIAGNOSIS — M62830 Muscle spasm of back: Secondary | ICD-10-CM

## 2021-04-27 DIAGNOSIS — M545 Low back pain, unspecified: Secondary | ICD-10-CM | POA: Diagnosis not present

## 2021-04-27 DIAGNOSIS — R262 Difficulty in walking, not elsewhere classified: Secondary | ICD-10-CM

## 2021-04-27 DIAGNOSIS — M6281 Muscle weakness (generalized): Secondary | ICD-10-CM

## 2021-04-27 DIAGNOSIS — M546 Pain in thoracic spine: Secondary | ICD-10-CM

## 2021-04-27 DIAGNOSIS — M25561 Pain in right knee: Secondary | ICD-10-CM

## 2021-04-27 NOTE — Therapy (Signed)
[place]. [place], [place], [address] Phone: [phone]   Fax:  [phone]  Physical Therapy Treatment  Patient Details  Name: [name] MRN: [id number] Date of Birth: 10-10-1932 Referring Provider (PT): Elsner   Encounter Date: 04/27/2021   PT End of Session - 04/27/21 1658     Visit Number 23    Date for PT Re-Evaluation 05/14/21    PT Start Time 1608    PT Stop Time 1658    PT Time Calculation (min) 50 min    Activity Tolerance Patient tolerated treatment well    Behavior During Therapy [facility] for tasks assessed/performed             Past Medical History:  Diagnosis Date   Acalculous cholecystitis 08/24/2018   Arthritis    DVT of lower extremity (deep venous thrombosis) (HCC)    right leg - after back surgery in 2019   GERD (gastroesophageal reflux disease)    History of blood transfusion    Hypothyroidism    PONV (postoperative nausea and vomiting)    Ptosis of both eyelids    Vertigo    Wears glasses    reading    Past Surgical History:  Procedure Laterality Date   ABDOMINAL HYSTERECTOMY  8099   APPLICATION OF ROBOTIC ASSISTANCE FOR SPINAL PROCEDURE N/A 07/17/2017   Procedure: APPLICATION OF ROBOTIC ASSISTANCE FOR SPINAL PROCEDURE;  Surgeon: Kristeen Miss, MD;  Location: [place];  Service: Neurosurgery;  Laterality: N/A;   APPLICATION OF ROBOTIC ASSISTANCE FOR SPINAL PROCEDURE N/A 09/01/2017   Procedure: APPLICATION OF ROBOTIC ASSISTANCE FOR SPINAL PROCEDURE;  Surgeon: Kristeen Miss, MD;  Location: [place];  Service: Neurosurgery;  Laterality: N/A;   APPLICATION OF ROBOTIC ASSISTANCE FOR SPINAL PROCEDURE N/A 10/27/2020   Procedure: APPLICATION OF ROBOTIC ASSISTANCE FOR SPINAL PROCEDURE;  Surgeon: Kristeen Miss, MD;  Location: [place];  Service: Neurosurgery;  Laterality: N/A;   BACK SURGERY  1986   lumb lam   CARPAL TUNNEL RELEASE  2000   right   CARPAL TUNNEL RELEASE  2012   left    CHOLECYSTECTOMY N/A 08/24/2018   Procedure: LAPAROSCOPIC CHOLECYSTECTOMY WITH INTRAOPERATIVE CHOLANGIOGRAM;  Surgeon: Fanny Skates, MD;  Location: [facility] OR;  Service: General;  Laterality: N/A;   COLONOSCOPY     CYSTOCELE REPAIR  2007   sling   DISTAL INTERPHALANGEAL JOINT FUSION Right 02/04/2014   Procedure: FUSION DISTAL INTERPHALANGEAL JOINT RIGHT INDEX FINGER ;  Surgeon: Daryll Brod, MD;  Location: [place];  Service: Orthopedics;  Laterality: Right;   EYE SURGERY Bilateral    Cataract surgery with lens implant   KNEE SURGERY  2009,2011   partial knee   LAPAROSCOPY N/A 08/24/2018   Procedure: LAPAROSCOPY DIAGNOSTIC;  Surgeon: Fanny Skates, MD;  Location: [place];  Service: General;  Laterality: N/A;   LUMBAR PERCUTANEOUS PEDICLE SCREW 4 LEVEL N/A 10/27/2020   Procedure: Thoracic nine-ten Laminectomy with extension of fusion from Thoracic ten to Thoracic six with segmental fixation (cemented) with pedicle screw and mazor;  Surgeon: Kristeen Miss, MD;  Location: [place];  Service: Neurosurgery;  Laterality: N/A;   OPEN REDUCTION INTERNAL FIXATION (ORIF) PROXIMAL PHALANX Left 08/30/2013   Procedure: OPEN REDUCTION INTERNAL FIXATION (ORIF) PROXIMAL PHALANX FRACTURE LEFT SMALL FINGER; SPLINT RING FINGER;  Surgeon: Wynonia Sours, MD;  Location: [place];  Service: Orthopedics;  Laterality: Left;   POSTERIOR LUMBAR FUSION 4 LEVEL N/A 07/17/2017   Procedure: Thoracic Ten -  Lumbar Five revison of hardware with Mazor;  Surgeon: Kristeen Miss, MD;  Location: [place];  Service: Neurosurgery;  Laterality: N/A;  Thoracic/Lumbar   PTOSIS REPAIR Bilateral 07/27/2015   Procedure: PTOSIS REPAIR;  Surgeon: Cristine Polio, MD;  Location: [place];  Service: Plastics;  Laterality: Bilateral;   SHOULDER SURGERY     rt rcr,and lt   TONSILLECTOMY     TOTAL KNEE ARTHROPLASTY Right 12/23/2019   Procedure: TOTAL KNEE ARTHROPLASTY;  Surgeon: Vickey Huger, MD;  [facility];  Service: Orthopedics;  Laterality: Right;    There were no vitals filed for this visit.   Subjective Assessment - 04/27/21 1608     Subjective i was a little tired    Currently in Pain? No/denies                               [facility] Adult PT Treatment/Exercise - 04/27/21 0001       Ambulation/Gait   Gait Comments with SPC 200' x 2, 40 feet without device x2 needing CGA      Knee/Hip Exercises: Machines for Strengthening   Cybex Knee Extension 10# 2x10    Cybex Knee Flexion 35lb 2x10    Other Machine seated row 20# lats 20# 3x10, 10# atraight arm pulls for core      Knee/Hip Exercises: Standing   Other Standing Knee Exercises resisted gait 20# fwd, bkwd and side stepping      Knee/Hip Exercises: Seated   Other Seated Knee/Hip Exercises yellow tband ball b/n knees hip IR    Other Seated Knee/Hip Exercises right ankle red tband exercises , calf and HS stretch, DF is wihtout band                       PT Short Term Goals - 04/15/21 1638       PT SHORT TERM GOAL #1   Title independent with initial HEP    Status Achieved               PT Long Term Goals - 04/15/21 1607       PT LONG TERM GOAL #1   Title decrease pain 50%    Status Partially Met      PT LONG TERM GOAL #2   Title increase lumbar ROM 25% with less pain    Status Achieved      PT LONG TERM GOAL #3   Title increase LE strength to 4+/5    Status Partially Met      PT LONG TERM GOAL #4   Title go up and down stairs step over step    Status Partially Met      PT LONG TERM GOAL #5   Title decrease TUG to 18 seconds    Status Partially Met                   Plan - 04/27/21 1659     Clinical Impression Statement Patient has some fear with new activities, she really was hesitant with the resisted gait today.  She is really trying to get better, the right hip abduction is the weak spot as is the right ankle DF.  She would like to get off of the  walker and I think she can she just has fear and tends to distract easily and when distracted she really veers off and will use walls and other objects  for stability    PT Next Visit Plan balance, stairs, function    Consulted and Agree with Plan of Care Patient             Patient will benefit from skilled therapeutic intervention in order to improve the following deficits and impairments:  Abnormal gait, Decreased range of motion, Decreased balance, Decreased scar mobility, Impaired flexibility, Decreased strength, Decreased mobility, Decreased endurance, Pain, Cardiopulmonary status limiting activity, Decreased activity tolerance, Postural dysfunction, Improper body mechanics, Increased muscle spasms, Difficulty walking  Visit Diagnosis: Acute bilateral low back pain without sciatica  Difficulty in walking, not elsewhere classified  Muscle weakness (generalized)  Muscle spasm of back  Pain in thoracic spine  Acute pain of right knee     Problem List Patient Active Problem List   Diagnosis Date Noted   Myelopathy concurrent with and due to spinal stenosis of thoracic region ([place]) 10/27/2020   S/P total knee arthroplasty, right 12/23/2019   Acalculous cholecystitis 08/24/2018   Osteoarthritis of right knee    Vertigo    DDD (degenerative disc disease), cervical    Benign essential HTN    History of DVT (deep vein thrombosis)    Tachycardia    Steroid-induced hyperglycemia    Hypothyroidism    Neuropathic pain    Acute blood loss anemia    S/P lumbar fusion    Closed L5 vertebral fracture (HCC) 08/31/2017   Closed fracture of fifth lumbar vertebra ([place]) 08/30/2017   Lumbar vertebral fracture ([place]) 07/17/2017   Scoliosis 06/13/2017    Sumner Boast, PT 04/27/2021, 5:02 PM  [place]. [place], [place], [address] Phone: [phone]   Fax:  [phone]  Name: [name] MRN:  [id number] Date of Birth: [birth year]

## 2021-04-29 ENCOUNTER — Ambulatory Visit: Payer: Medicare Other | Admitting: Physical Therapy

## 2021-04-29 ENCOUNTER — Other Ambulatory Visit: Payer: Self-pay

## 2021-04-29 ENCOUNTER — Encounter: Payer: Self-pay | Admitting: Physical Therapy

## 2021-04-29 DIAGNOSIS — M546 Pain in thoracic spine: Secondary | ICD-10-CM

## 2021-04-29 DIAGNOSIS — M6281 Muscle weakness (generalized): Secondary | ICD-10-CM

## 2021-04-29 DIAGNOSIS — M545 Low back pain, unspecified: Secondary | ICD-10-CM | POA: Diagnosis not present

## 2021-04-29 DIAGNOSIS — M62830 Muscle spasm of back: Secondary | ICD-10-CM

## 2021-04-29 DIAGNOSIS — R262 Difficulty in walking, not elsewhere classified: Secondary | ICD-10-CM

## 2021-04-29 NOTE — Therapy (Signed)
[place]. [place], [place], [address] Phone: [phone]   Fax:  [phone]  Physical Therapy Treatment  Patient Details  Name: RUBA OUTEN MRN: [id number] Date of Birth: 1932-04-21 Referring Provider (PT): Elsner   Encounter Date: 04/29/2021   PT End of Session - 04/29/21 1651     Visit Number 24    Date for PT Re-Evaluation 05/14/21    PT Start Time 5631    PT Stop Time 1652    PT Time Calculation (min) 46 min    Activity Tolerance Patient tolerated treatment well    Behavior During Therapy [facility] for tasks assessed/performed             Past Medical History:  Diagnosis Date   Acalculous cholecystitis 08/24/2018   Arthritis    DVT of lower extremity (deep venous thrombosis) (HCC)    right leg - after back surgery in 2019   GERD (gastroesophageal reflux disease)    History of blood transfusion    Hypothyroidism    PONV (postoperative nausea and vomiting)    Ptosis of both eyelids    Vertigo    Wears glasses    reading    Past Surgical History:  Procedure Laterality Date   ABDOMINAL HYSTERECTOMY  4970   APPLICATION OF ROBOTIC ASSISTANCE FOR SPINAL PROCEDURE N/A 07/17/2017   Procedure: APPLICATION OF ROBOTIC ASSISTANCE FOR SPINAL PROCEDURE;  Surgeon: Kristeen Miss, MD;  Location: [place];  Service: Neurosurgery;  Laterality: N/A;   APPLICATION OF ROBOTIC ASSISTANCE FOR SPINAL PROCEDURE N/A 09/01/2017   Procedure: APPLICATION OF ROBOTIC ASSISTANCE FOR SPINAL PROCEDURE;  Surgeon: Kristeen Miss, MD;  Location: [place];  Service: Neurosurgery;  Laterality: N/A;   APPLICATION OF ROBOTIC ASSISTANCE FOR SPINAL PROCEDURE N/A 10/27/2020   Procedure: APPLICATION OF ROBOTIC ASSISTANCE FOR SPINAL PROCEDURE;  Surgeon: Kristeen Miss, MD;  Location: [place];  Service: Neurosurgery;  Laterality: N/A;   BACK SURGERY  1986   lumb lam   CARPAL TUNNEL RELEASE  2000   right   CARPAL TUNNEL RELEASE  2012   left    CHOLECYSTECTOMY N/A 08/24/2018   Procedure: LAPAROSCOPIC CHOLECYSTECTOMY WITH INTRAOPERATIVE CHOLANGIOGRAM;  Surgeon: Fanny Skates, MD;  Location: [facility] OR;  Service: General;  Laterality: N/A;   COLONOSCOPY     CYSTOCELE REPAIR  2007   sling   DISTAL INTERPHALANGEAL JOINT FUSION Right 02/04/2014   Procedure: FUSION DISTAL INTERPHALANGEAL JOINT RIGHT INDEX FINGER ;  Surgeon: Daryll Brod, MD;  Location: [place];  Service: Orthopedics;  Laterality: Right;   EYE SURGERY Bilateral    Cataract surgery with lens implant   KNEE SURGERY  2009,2011   partial knee   LAPAROSCOPY N/A 08/24/2018   Procedure: LAPAROSCOPY DIAGNOSTIC;  Surgeon: Fanny Skates, MD;  Location: [place];  Service: General;  Laterality: N/A;   LUMBAR PERCUTANEOUS PEDICLE SCREW 4 LEVEL N/A 10/27/2020   Procedure: Thoracic nine-ten Laminectomy with extension of fusion from Thoracic ten to Thoracic six with segmental fixation (cemented) with pedicle screw and mazor;  Surgeon: Kristeen Miss, MD;  Location: [place];  Service: Neurosurgery;  Laterality: N/A;   OPEN REDUCTION INTERNAL FIXATION (ORIF) PROXIMAL PHALANX Left 08/30/2013   Procedure: OPEN REDUCTION INTERNAL FIXATION (ORIF) PROXIMAL PHALANX FRACTURE LEFT SMALL FINGER; SPLINT RING FINGER;  Surgeon: Wynonia Sours, MD;  Location: [place];  Service: Orthopedics;  Laterality: Left;   POSTERIOR LUMBAR FUSION 4 LEVEL N/A 07/17/2017   Procedure: Thoracic Ten -  Lumbar Five revison of hardware with Mazor;  Surgeon: Kristeen Miss, MD;  Location: [place];  Service: Neurosurgery;  Laterality: N/A;  Thoracic/Lumbar   PTOSIS REPAIR Bilateral 07/27/2015   Procedure: PTOSIS REPAIR;  Surgeon: Cristine Polio, MD;  Location: [place];  Service: Plastics;  Laterality: Bilateral;   SHOULDER SURGERY     rt rcr,and lt   TONSILLECTOMY     TOTAL KNEE ARTHROPLASTY Right 12/23/2019   Procedure: TOTAL KNEE ARTHROPLASTY;  Surgeon: Vickey Huger, MD;  [facility]: [facility];  Service: Orthopedics;  Laterality: Right;    There were no vitals filed for this visit.   Subjective Assessment - 04/29/21 1609     Subjective Oh my I was very tired and sore all over    Limitations Sitting    Currently in Pain? Yes    Pain Score 3     Pain Location Back    Pain Orientation Lower    Aggravating Factors  the exercises                               [facility] Adult PT Treatment/Exercise - 04/29/21 0001       Ambulation/Gait   Gait Comments SPC 110' x 3 with SBA      High Level Balance   High Level Balance Comments on airex ball toss, marching, trying to do light touch      Exercises   Exercises Knee/Hip      Knee/Hip Exercises: Stretches   Gastroc Stretch Both;3 reps;20 seconds      Knee/Hip Exercises: Aerobic   Recumbent Bike level 3.5 x 5 minutes      Knee/Hip Exercises: Machines for Strengthening   Cybex Knee Extension 10# 2x10    Cybex Knee Flexion 35lb 2x10    Other Machine seated row 20# lats 20# 3x10, 5# atraight arm pulls for core      Knee/Hip Exercises: Standing   Hip Flexion Both;2 sets;10 reps    Hip Flexion Limitations 3#    Hip Abduction 10 reps;Both;2 sets    Abduction Limitations 3#    Hip Extension 10 reps;Both;2 sets    Extension Limitations 3#      Knee/Hip Exercises: Seated   Other Seated Knee/Hip Exercises right ankle red tband exercises , calf and HS stretch, DF is wihtout band      Knee/Hip Exercises: Supine   Other Supine Knee/Hip Exercises feet on on ball K2C, trunk rotation, small bridges and isometric abs                       PT Short Term Goals - 04/15/21 1638       PT SHORT TERM GOAL #1   Title independent with initial HEP    Status Achieved               PT Long Term Goals - 04/29/21 1653       PT LONG TERM GOAL #1   Title decrease pain 50%    Status Partially Met      PT LONG TERM GOAL #2   Title increase lumbar ROM 25% with less pain    Status Achieved       PT LONG TERM GOAL #3   Title increase LE strength to 4+/5    Status Partially Met      PT LONG TERM GOAL #4   Title go up and down stairs step  over step    Status Partially Met                   Plan - 04/29/21 1652     Clinical Impression Statement Patient reports that she was very sore all over with the last treatment.  I did a little more balance and then did some core work with her today.  She had no c/o pain with anything just reported feeling the mms working.  The right hip and right ankle remain weak    PT Next Visit Plan balance, stairs, function    Consulted and Agree with Plan of Care Patient             Patient will benefit from skilled therapeutic intervention in order to improve the following deficits and impairments:  Abnormal gait, Decreased range of motion, Decreased balance, Decreased scar mobility, Impaired flexibility, Decreased strength, Decreased mobility, Decreased endurance, Pain, Cardiopulmonary status limiting activity, Decreased activity tolerance, Postural dysfunction, Improper body mechanics, Increased muscle spasms, Difficulty walking  Visit Diagnosis: Acute bilateral low back pain without sciatica  Difficulty in walking, not elsewhere classified  Muscle weakness (generalized)  Muscle spasm of back  Pain in thoracic spine     Problem List Patient Active Problem List   Diagnosis Date Noted   Myelopathy concurrent with and due to spinal stenosis of thoracic region ([place]) 10/27/2020   S/P total knee arthroplasty, right 12/23/2019   Acalculous cholecystitis 08/24/2018   Osteoarthritis of right knee    Vertigo    DDD (degenerative disc disease), cervical    Benign essential HTN    History of DVT (deep vein thrombosis)    Tachycardia    Steroid-induced hyperglycemia    Hypothyroidism    Neuropathic pain    Acute blood loss anemia    S/P lumbar fusion    Closed L5 vertebral fracture (HCC) 08/31/2017   Closed fracture of fifth  lumbar vertebra ([place]) 08/30/2017   Lumbar vertebral fracture ([place]) 07/17/2017   Scoliosis 06/13/2017    Sumner Boast, PT 04/29/2021, 4:54 PM  [place]. [place], [place], [address] Phone: [phone]   Fax:  [phone]  Name: RILYN SCROGGS MRN: [id number] Date of Birth: 1932/09/07

## 2021-05-06 ENCOUNTER — Other Ambulatory Visit: Payer: Self-pay

## 2021-05-06 ENCOUNTER — Encounter: Payer: Self-pay | Admitting: Physical Therapy

## 2021-05-06 ENCOUNTER — Ambulatory Visit: Payer: Medicare Other | Attending: Neurological Surgery | Admitting: Physical Therapy

## 2021-05-06 DIAGNOSIS — R262 Difficulty in walking, not elsewhere classified: Secondary | ICD-10-CM | POA: Diagnosis present

## 2021-05-06 DIAGNOSIS — M6281 Muscle weakness (generalized): Secondary | ICD-10-CM | POA: Diagnosis present

## 2021-05-06 DIAGNOSIS — M62830 Muscle spasm of back: Secondary | ICD-10-CM | POA: Diagnosis present

## 2021-05-06 DIAGNOSIS — M546 Pain in thoracic spine: Secondary | ICD-10-CM | POA: Diagnosis present

## 2021-05-06 DIAGNOSIS — M545 Low back pain, unspecified: Secondary | ICD-10-CM | POA: Diagnosis not present

## 2021-05-06 NOTE — Therapy (Signed)
[place]. [place], [place], [address] Phone: [phone]   Fax:  [phone]  Physical Therapy Treatment  Patient Details  Name: [name] MRN: [id number] Date of Birth: [birth year] Referring Provider (PT): Elsner   Encounter Date: 05/06/2021   PT End of Session - 05/06/21 1645     Visit Number 25    Date for PT Re-Evaluation 05/14/21    PT Start Time 1600    PT Stop Time 1645    PT Time Calculation (min) 45 min    Activity Tolerance Patient tolerated treatment well    Behavior During Therapy [facility] for tasks assessed/performed             Past Medical History:  Diagnosis Date   Acalculous cholecystitis 08/24/2018   Arthritis    DVT of lower extremity (deep venous thrombosis) (HCC)    right leg - after back surgery in 2019   GERD (gastroesophageal reflux disease)    History of blood transfusion    Hypothyroidism    PONV (postoperative nausea and vomiting)    Ptosis of both eyelids    Vertigo    Wears glasses    reading    Past Surgical History:  Procedure Laterality Date   ABDOMINAL HYSTERECTOMY  8527   APPLICATION OF ROBOTIC ASSISTANCE FOR SPINAL PROCEDURE N/A 07/17/2017   Procedure: APPLICATION OF ROBOTIC ASSISTANCE FOR SPINAL PROCEDURE;  Surgeon: Kristeen Miss, MD;  Location: [place];  Service: Neurosurgery;  Laterality: N/A;   APPLICATION OF ROBOTIC ASSISTANCE FOR SPINAL PROCEDURE N/A 09/01/2017   Procedure: APPLICATION OF ROBOTIC ASSISTANCE FOR SPINAL PROCEDURE;  Surgeon: Kristeen Miss, MD;  Location: [place];  Service: Neurosurgery;  Laterality: N/A;   APPLICATION OF ROBOTIC ASSISTANCE FOR SPINAL PROCEDURE N/A 10/27/2020   Procedure: APPLICATION OF ROBOTIC ASSISTANCE FOR SPINAL PROCEDURE;  Surgeon: Kristeen Miss, MD;  Location: [place];  Service: Neurosurgery;  Laterality: N/A;   BACK SURGERY  1986   lumb lam   CARPAL TUNNEL RELEASE  2000   right   CARPAL TUNNEL RELEASE  2012   left    CHOLECYSTECTOMY N/A 08/24/2018   Procedure: LAPAROSCOPIC CHOLECYSTECTOMY WITH INTRAOPERATIVE CHOLANGIOGRAM;  Surgeon: Fanny Skates, MD;  Location: [facility] OR;  Service: General;  Laterality: N/A;   COLONOSCOPY     CYSTOCELE REPAIR  2007   sling   DISTAL INTERPHALANGEAL JOINT FUSION Right 02/04/2014   Procedure: FUSION DISTAL INTERPHALANGEAL JOINT RIGHT INDEX FINGER ;  Surgeon: Daryll Brod, MD;  Location: [place];  Service: Orthopedics;  Laterality: Right;   EYE SURGERY Bilateral    Cataract surgery with lens implant   KNEE SURGERY  2009,2011   partial knee   LAPAROSCOPY N/A 08/24/2018   Procedure: LAPAROSCOPY DIAGNOSTIC;  Surgeon: Fanny Skates, MD;  Location: [place];  Service: General;  Laterality: N/A;   LUMBAR PERCUTANEOUS PEDICLE SCREW 4 LEVEL N/A 10/27/2020   Procedure: Thoracic nine-ten Laminectomy with extension of fusion from Thoracic ten to Thoracic six with segmental fixation (cemented) with pedicle screw and mazor;  Surgeon: Kristeen Miss, MD;  Location: [place];  Service: Neurosurgery;  Laterality: N/A;   OPEN REDUCTION INTERNAL FIXATION (ORIF) PROXIMAL PHALANX Left 08/30/2013   Procedure: OPEN REDUCTION INTERNAL FIXATION (ORIF) PROXIMAL PHALANX FRACTURE LEFT SMALL FINGER; SPLINT RING FINGER;  Surgeon: Wynonia Sours, MD;  Location: [place];  Service: Orthopedics;  Laterality: Left;   POSTERIOR LUMBAR FUSION 4 LEVEL N/A 07/17/2017   Procedure: Thoracic Ten -  Lumbar Five revison of hardware with Mazor;  Surgeon: Kristeen Miss, MD;  Location: [place];  Service: Neurosurgery;  Laterality: N/A;  Thoracic/Lumbar   PTOSIS REPAIR Bilateral 07/27/2015   Procedure: PTOSIS REPAIR;  Surgeon: Cristine Polio, MD;  Location: [place];  Service: Plastics;  Laterality: Bilateral;   SHOULDER SURGERY     rt rcr,and lt   TONSILLECTOMY     TOTAL KNEE ARTHROPLASTY Right 12/23/2019   Procedure: TOTAL KNEE ARTHROPLASTY;  Surgeon: Vickey Huger, MD;  [facility];  Service: Orthopedics;  Laterality: Right;    There were no vitals filed for this visit.   Subjective Assessment - 05/06/21 1558     Subjective "Pretty good" Using cane inside the house    Currently in Pain? No/denies                               [facility] Adult PT Treatment/Exercise - 05/06/21 0001       Ambulation/Gait   Ambulation/Gait Yes    Ambulation/Gait Assistance 5: Supervision    Ambulation Distance (Feet) 225 Feet    Assistive device Straight cane    Gait Comments Gait no aid 7f x2      Knee/Hip Exercises: Aerobic   Recumbent Bike level 3 x 5 minutes      Knee/Hip Exercises: Machines for Strengthening   Cybex Knee Extension 10# 2x10    Cybex Knee Flexion 35lb 2x10    Other Machine seated row 20# lats 20# 3x10, 25# atraight arm pulls for core      Knee/Hip Exercises: Standing   Other Standing Knee Exercises On airex alt 6in box taps w/ SPC 2x5    Other Standing Knee Exercises Alt box taps 6 in 2x5 with SPC      Knee/Hip Exercises: Seated   Other Seated Knee/Hip Exercises right ankle red tband exercises -    Sit to Sand 1 set;10 reps;without UE support   LE on airex min assist                      PT Short Term Goals - 04/15/21 1638       PT SHORT TERM GOAL #1   Title independent with initial HEP    Status Achieved               PT Long Term Goals - 04/29/21 1653       PT LONG TERM GOAL #1   Title decrease pain 50%    Status Partially Met      PT LONG TERM GOAL #2   Title increase lumbar ROM 25% with less pain    Status Achieved      PT LONG TERM GOAL #3   Title increase LE strength to 4+/5    Status Partially Met      PT LONG TERM GOAL #4   Title go up and down stairs step over step    Status Partially Met                   Plan - 05/06/21 1646     Clinical Impression Statement Focus on balance, gait, and strength. Pt did well progressing to gait without AD. Fatigue with gait using SPC  after 225 feet requiring rest. Non compliant surface posed th most challenge for patient. difficulty maintain stability with sit to stands on airex. CGA needed to complete alt box taps on  airex.    Stability/Clinical Decision Making Evolving/Moderate complexity    Rehab Potential Good    PT Frequency 2x / week    PT Duration 6 weeks    PT Treatment/Interventions ADLs/Self Care Home Management;Cryotherapy;Gait training;Neuromuscular re-education;Balance training;Therapeutic exercise;Therapeutic activities;Functional mobility training;Stair training;Patient/family education;Manual techniques;Ultrasound;Moist Heat;Electrical Stimulation    PT Next Visit Plan balance, stairs, function             Patient will benefit from skilled therapeutic intervention in order to improve the following deficits and impairments:  Abnormal gait, Decreased range of motion, Decreased balance, Decreased scar mobility, Impaired flexibility, Decreased strength, Decreased mobility, Decreased endurance, Pain, Cardiopulmonary status limiting activity, Decreased activity tolerance, Postural dysfunction, Improper body mechanics, Increased muscle spasms, Difficulty walking  Visit Diagnosis: Acute bilateral low back pain without sciatica  Muscle weakness (generalized)  Difficulty in walking, not elsewhere classified  Muscle spasm of back     Problem List Patient Active Problem List   Diagnosis Date Noted   Myelopathy concurrent with and due to spinal stenosis of thoracic region ([place]) 10/27/2020   S/P total knee arthroplasty, right 12/23/2019   Acalculous cholecystitis 08/24/2018   Osteoarthritis of right knee    Vertigo    DDD (degenerative disc disease), cervical    Benign essential HTN    History of DVT (deep vein thrombosis)    Tachycardia    Steroid-induced hyperglycemia    Hypothyroidism    Neuropathic pain    Acute blood loss anemia    S/P lumbar fusion    Closed L5 vertebral fracture (HCC)  08/31/2017   Closed fracture of fifth lumbar vertebra ([place]) 08/30/2017   Lumbar vertebral fracture ([place]) 07/17/2017   Scoliosis 06/13/2017    Scot Jun, PTA 05/06/2021, 4:49 PM  [place]. [place], [place], [address] Phone: [phone]   Fax:  [phone]  Name: [name] MRN: [id number] Date of Birth: [birth year]

## 2021-05-10 ENCOUNTER — Encounter (HOSPITAL_BASED_OUTPATIENT_CLINIC_OR_DEPARTMENT_OTHER): Payer: Self-pay | Admitting: Orthopedic Surgery

## 2021-05-11 ENCOUNTER — Ambulatory Visit: Payer: Medicare Other | Admitting: Physical Therapy

## 2021-05-13 ENCOUNTER — Ambulatory Visit: Payer: Medicare Other | Admitting: Physical Therapy

## 2021-05-17 ENCOUNTER — Ambulatory Visit: Payer: Medicare Other | Admitting: Physical Therapy

## 2021-05-17 ENCOUNTER — Encounter: Payer: Self-pay | Admitting: Physical Therapy

## 2021-05-17 ENCOUNTER — Other Ambulatory Visit: Payer: Self-pay

## 2021-05-17 DIAGNOSIS — M6281 Muscle weakness (generalized): Secondary | ICD-10-CM

## 2021-05-17 DIAGNOSIS — M545 Low back pain, unspecified: Secondary | ICD-10-CM

## 2021-05-17 DIAGNOSIS — R262 Difficulty in walking, not elsewhere classified: Secondary | ICD-10-CM

## 2021-05-17 DIAGNOSIS — M62830 Muscle spasm of back: Secondary | ICD-10-CM

## 2021-05-17 DIAGNOSIS — M546 Pain in thoracic spine: Secondary | ICD-10-CM

## 2021-05-17 NOTE — Therapy (Signed)
[place]. [place], [place], [address] Phone: [phone]   Fax:  [phone]  Physical Therapy Treatment  Patient Details  Name: [name] MRN: [id number] Date of Birth: [birth year] Referring Provider (PT): Elsner   Encounter Date: 05/17/2021   PT End of Session - 05/17/21 1632     Visit Number 26    Date for PT Re-Evaluation 06/14/21    Authorization Type [employer] medicare    PT Start Time 1600    PT Stop Time 9381    PT Time Calculation (min) 46 min    Activity Tolerance Patient tolerated treatment well    Behavior During Therapy [facility] for tasks assessed/performed             Past Medical History:  Diagnosis Date   Acalculous cholecystitis 08/24/2018   Arthritis    DVT of lower extremity (deep venous thrombosis) (HCC)    right leg - after back surgery in 2019   GERD (gastroesophageal reflux disease)    History of blood transfusion    Hypothyroidism    PONV (postoperative nausea and vomiting)    Ptosis of both eyelids    Vertigo    Wears glasses    reading    Past Surgical History:  Procedure Laterality Date   ABDOMINAL HYSTERECTOMY  8299   APPLICATION OF ROBOTIC ASSISTANCE FOR SPINAL PROCEDURE N/A 07/17/2017   Procedure: APPLICATION OF ROBOTIC ASSISTANCE FOR SPINAL PROCEDURE;  Surgeon: Kristeen Miss, MD;  Location: [place];  Service: Neurosurgery;  Laterality: N/A;   APPLICATION OF ROBOTIC ASSISTANCE FOR SPINAL PROCEDURE N/A 09/01/2017   Procedure: APPLICATION OF ROBOTIC ASSISTANCE FOR SPINAL PROCEDURE;  Surgeon: Kristeen Miss, MD;  Location: [place];  Service: Neurosurgery;  Laterality: N/A;   APPLICATION OF ROBOTIC ASSISTANCE FOR SPINAL PROCEDURE N/A 10/27/2020   Procedure: APPLICATION OF ROBOTIC ASSISTANCE FOR SPINAL PROCEDURE;  Surgeon: Kristeen Miss, MD;  Location: [place];  Service: Neurosurgery;  Laterality: N/A;   BACK SURGERY  1986   lumb lam   CARPAL TUNNEL RELEASE  2000   right   CARPAL  TUNNEL RELEASE  2012   left   CHOLECYSTECTOMY N/A 08/24/2018   Procedure: LAPAROSCOPIC CHOLECYSTECTOMY WITH INTRAOPERATIVE CHOLANGIOGRAM;  Surgeon: Fanny Skates, MD;  Location: [facility];  Service: General;  Laterality: N/A;   COLONOSCOPY     CYSTOCELE REPAIR  2007   sling   DISTAL INTERPHALANGEAL JOINT FUSION Right 02/04/2014   Procedure: FUSION DISTAL INTERPHALANGEAL JOINT RIGHT INDEX FINGER ;  Surgeon: Daryll Brod, MD;  Location: [place];  Service: Orthopedics;  Laterality: Right;   EYE SURGERY Bilateral    Cataract surgery with lens implant   KNEE SURGERY  2009,2011   partial knee   LAPAROSCOPY N/A 08/24/2018   Procedure: LAPAROSCOPY DIAGNOSTIC;  Surgeon: Fanny Skates, MD;  Location: [place];  Service: General;  Laterality: N/A;   LUMBAR PERCUTANEOUS PEDICLE SCREW 4 LEVEL N/A 10/27/2020   Procedure: Thoracic nine-ten Laminectomy with extension of fusion from Thoracic ten to Thoracic six with segmental fixation (cemented) with pedicle screw and mazor;  Surgeon: Kristeen Miss, MD;  Location: [place];  Service: Neurosurgery;  Laterality: N/A;   OPEN REDUCTION INTERNAL FIXATION (ORIF) PROXIMAL PHALANX Left 08/30/2013   Procedure: OPEN REDUCTION INTERNAL FIXATION (ORIF) PROXIMAL PHALANX FRACTURE LEFT SMALL FINGER; SPLINT RING FINGER;  Surgeon: Wynonia Sours, MD;  Location: [place];  Service: Orthopedics;  Laterality: Left;   POSTERIOR LUMBAR FUSION 4 LEVEL N/A  07/17/2017   Procedure: Thoracic Ten - Lumbar Five revison of hardware with Mazor;  Surgeon: Kristeen Miss, MD;  Location: [place];  Service: Neurosurgery;  Laterality: N/A;  Thoracic/Lumbar   PTOSIS REPAIR Bilateral 07/27/2015   Procedure: PTOSIS REPAIR;  Surgeon: Cristine Polio, MD;  Location: [place];  Service: Plastics;  Laterality: Bilateral;   SHOULDER SURGERY     rt rcr,and lt   TONSILLECTOMY     TOTAL KNEE ARTHROPLASTY Right 12/23/2019   Procedure: TOTAL KNEE ARTHROPLASTY;  Surgeon:  Vickey Huger, MD;  [facility];  Service: Orthopedics;  Laterality: Right;    There were no vitals filed for this visit.   Subjective Assessment - 05/17/21 1626     Subjective Patient reports that 10 days ago she started having left knee pain, she reports it was significant, reports that she cancelled her PT appointment, went to the knee surgeon and he thought it was just tendonitis, she rpeorts that she has been resting and icing    Currently in Pain? Yes    Pain Score 6     Pain Location Knee    Pain Orientation Left    Pain Descriptors / Indicators Sore;Shooting    Pain Type Acute pain    Aggravating Factors  bending, walking    Pain Relieving Factors ice and rest                [facility] PT Assessment - 05/17/21 0001       Assessment   Medical Diagnosis Thoracic back pain with fusion 10/27/20    Referring Provider (PT) Elsner      Strength   Overall Strength Comments left knee motions increased pain    Left Knee Flexion 3+/5    Left Knee Extension 3+/5                           [facility] Adult PT Treatment/Exercise - 05/17/21 0001       Knee/Hip Exercises: Stretches   Gastroc Stretch Both;3 reps;20 seconds      Knee/Hip Exercises: Aerobic   Nustep level 3 x 4 minutes      Knee/Hip Exercises: Seated   Ball Squeeze 20    Other Seated Knee/Hip Exercises right ankle red tband exercises -    Hamstring Curl Both;2 sets;10 reps    Hamstring Limitations red tband      Modalities   Modalities Cryotherapy      Cryotherapy   Number Minutes Cryotherapy 10 Minutes    Cryotherapy Location Knee    Type of Cryotherapy Ice pack      Electrical Stimulation   Electrical Stimulation Location left knee    Electrical Stimulation Action IFC    Electrical Stimulation Parameters sitting    Electrical Stimulation Goals Pain                       PT Short Term Goals - 04/15/21 1638       PT SHORT TERM GOAL #1   Title independent with initial  HEP    Status Achieved               PT Long Term Goals - 05/17/21 1635       PT LONG TERM GOAL #1   Title decrease pain 50%    Status Partially Met      PT LONG TERM GOAL #2   Title increase lumbar ROM 25% with less pain  Status Achieved      PT LONG TERM GOAL #3   Title increase LE strength to 4+/5    Status Partially Met      PT LONG TERM GOAL #4   Title go up and down stairs step over step    Status Partially Met      PT LONG TERM GOAL #5   Title decrease TUG to 18 seconds    Status Partially Met                   Plan - 05/17/21 1633     Clinical Impression Statement Patient had a significant increase of left knee pain about 10 days ago, she is unsure of a cause, she did see the knee surgeon and felt like everything was good, felt like she has tendonitis and gave the okay to return to PT but to go slow and be careful    PT Frequency 2x / week    PT Duration 8 weeks    PT Treatment/Interventions ADLs/Self Care Home Management;Cryotherapy;Gait training;Neuromuscular re-education;Balance training;Therapeutic exercise;Therapeutic activities;Functional mobility training;Stair training;Patient/family education;Manual techniques;Ultrasound;Moist Heat;Electrical Stimulation    PT Next Visit Plan work on gait, strength address pain as needed    Consulted and Agree with Plan of Care Patient             Patient will benefit from skilled therapeutic intervention in order to improve the following deficits and impairments:  Abnormal gait, Decreased range of motion, Decreased balance, Decreased scar mobility, Impaired flexibility, Decreased strength, Decreased mobility, Decreased endurance, Pain, Cardiopulmonary status limiting activity, Decreased activity tolerance, Postural dysfunction, Improper body mechanics, Increased muscle spasms, Difficulty walking  Visit Diagnosis: Acute bilateral low back pain without sciatica - Plan: PT plan of care  cert/re-cert  Muscle weakness (generalized) - Plan: PT plan of care cert/re-cert  Difficulty in walking, not elsewhere classified - Plan: PT plan of care cert/re-cert  Muscle spasm of back - Plan: PT plan of care cert/re-cert  Pain in thoracic spine - Plan: PT plan of care cert/re-cert     Problem List Patient Active Problem List   Diagnosis Date Noted   Myelopathy concurrent with and due to spinal stenosis of thoracic region ([place]) 10/27/2020   S/P total knee arthroplasty, right 12/23/2019   Acalculous cholecystitis 08/24/2018   Osteoarthritis of right knee    Vertigo    DDD (degenerative disc disease), cervical    Benign essential HTN    History of DVT (deep vein thrombosis)    Tachycardia    Steroid-induced hyperglycemia    Hypothyroidism    Neuropathic pain    Acute blood loss anemia    S/P lumbar fusion    Closed L5 vertebral fracture (HCC) 08/31/2017   Closed fracture of fifth lumbar vertebra ([place]) 08/30/2017   Lumbar vertebral fracture ([place]) 07/17/2017   Scoliosis 06/13/2017    Sumner Boast, PT 05/17/2021, 4:38 PM  [place]. [place], [place], [address] Phone: [phone]   Fax:  [phone]  Name: HERMINE FERIA MRN: [id number] Date of Birth: September 21, 1932

## 2021-05-18 NOTE — Progress Notes (Signed)

## 2021-05-19 ENCOUNTER — Encounter: Payer: Self-pay | Admitting: Physical Therapy

## 2021-05-19 ENCOUNTER — Other Ambulatory Visit: Payer: Self-pay

## 2021-05-19 ENCOUNTER — Ambulatory Visit: Payer: Medicare Other | Admitting: Physical Therapy

## 2021-05-19 DIAGNOSIS — M545 Low back pain, unspecified: Secondary | ICD-10-CM | POA: Diagnosis not present

## 2021-05-19 DIAGNOSIS — M62830 Muscle spasm of back: Secondary | ICD-10-CM

## 2021-05-19 DIAGNOSIS — R262 Difficulty in walking, not elsewhere classified: Secondary | ICD-10-CM

## 2021-05-19 DIAGNOSIS — M6281 Muscle weakness (generalized): Secondary | ICD-10-CM

## 2021-05-19 NOTE — Therapy (Signed)
[place]. [place], [place], [address] Phone: [phone]   Fax:  [phone]  Physical Therapy Treatment  Patient Details  Name: [name] MRN: [id number] Date of Birth: [birth year] Referring Provider (PT): Elsner   Encounter Date: 05/19/2021   PT End of Session - 05/19/21 1653     Visit Number 27    Date for PT Re-Evaluation 06/14/21    Authorization Type [employer] medicare    PT Start Time 1604    PT Stop Time 1654    PT Time Calculation (min) 50 min    Activity Tolerance Patient tolerated treatment well    Behavior During Therapy [facility] for tasks assessed/performed             Past Medical History:  Diagnosis Date   Acalculous cholecystitis 08/24/2018   Arthritis    DVT of lower extremity (deep venous thrombosis) (HCC)    right leg - after back surgery in 2019   GERD (gastroesophageal reflux disease)    History of blood transfusion    Hypothyroidism    PONV (postoperative nausea and vomiting)    Ptosis of both eyelids    Vertigo    Wears glasses    reading    Past Surgical History:  Procedure Laterality Date   ABDOMINAL HYSTERECTOMY  4166   APPLICATION OF ROBOTIC ASSISTANCE FOR SPINAL PROCEDURE N/A 07/17/2017   Procedure: APPLICATION OF ROBOTIC ASSISTANCE FOR SPINAL PROCEDURE;  Surgeon: Kristeen Miss, MD;  Location: [place];  Service: Neurosurgery;  Laterality: N/A;   APPLICATION OF ROBOTIC ASSISTANCE FOR SPINAL PROCEDURE N/A 09/01/2017   Procedure: APPLICATION OF ROBOTIC ASSISTANCE FOR SPINAL PROCEDURE;  Surgeon: Kristeen Miss, MD;  Location: [place];  Service: Neurosurgery;  Laterality: N/A;   APPLICATION OF ROBOTIC ASSISTANCE FOR SPINAL PROCEDURE N/A 10/27/2020   Procedure: APPLICATION OF ROBOTIC ASSISTANCE FOR SPINAL PROCEDURE;  Surgeon: Kristeen Miss, MD;  Location: [place];  Service: Neurosurgery;  Laterality: N/A;   BACK SURGERY  1986   lumb lam   CARPAL TUNNEL RELEASE  2000   right   CARPAL  TUNNEL RELEASE  2012   left   CHOLECYSTECTOMY N/A 08/24/2018   Procedure: LAPAROSCOPIC CHOLECYSTECTOMY WITH INTRAOPERATIVE CHOLANGIOGRAM;  Surgeon: Fanny Skates, MD;  Location: [facility];  Service: General;  Laterality: N/A;   COLONOSCOPY     CYSTOCELE REPAIR  2007   sling   DISTAL INTERPHALANGEAL JOINT FUSION Right 02/04/2014   Procedure: FUSION DISTAL INTERPHALANGEAL JOINT RIGHT INDEX FINGER ;  Surgeon: Daryll Brod, MD;  Location: [place];  Service: Orthopedics;  Laterality: Right;   EYE SURGERY Bilateral    Cataract surgery with lens implant   KNEE SURGERY  2009,2011   partial knee   LAPAROSCOPY N/A 08/24/2018   Procedure: LAPAROSCOPY DIAGNOSTIC;  Surgeon: Fanny Skates, MD;  Location: [place];  Service: General;  Laterality: N/A;   LUMBAR PERCUTANEOUS PEDICLE SCREW 4 LEVEL N/A 10/27/2020   Procedure: Thoracic nine-ten Laminectomy with extension of fusion from Thoracic ten to Thoracic six with segmental fixation (cemented) with pedicle screw and mazor;  Surgeon: Kristeen Miss, MD;  Location: [place];  Service: Neurosurgery;  Laterality: N/A;   OPEN REDUCTION INTERNAL FIXATION (ORIF) PROXIMAL PHALANX Left 08/30/2013   Procedure: OPEN REDUCTION INTERNAL FIXATION (ORIF) PROXIMAL PHALANX FRACTURE LEFT SMALL FINGER; SPLINT RING FINGER;  Surgeon: Wynonia Sours, MD;  Location: [place];  Service: Orthopedics;  Laterality: Left;   POSTERIOR LUMBAR FUSION 4 LEVEL N/A  07/17/2017   Procedure: Thoracic Ten - Lumbar Five revison of hardware with Mazor;  Surgeon: Kristeen Miss, MD;  Location: [place];  Service: Neurosurgery;  Laterality: N/A;  Thoracic/Lumbar   PTOSIS REPAIR Bilateral 07/27/2015   Procedure: PTOSIS REPAIR;  Surgeon: Cristine Polio, MD;  Location: [place];  Service: Plastics;  Laterality: Bilateral;   SHOULDER SURGERY     rt rcr,and lt   TONSILLECTOMY     TOTAL KNEE ARTHROPLASTY Right 12/23/2019   Procedure: TOTAL KNEE ARTHROPLASTY;  Surgeon:  Vickey Huger, MD;  [facility];  Service: Orthopedics;  Laterality: Right;    There were no vitals filed for this visit.   Subjective Assessment - 05/19/21 1613     Subjective Patient reports that she is feeling better will have surgery next week on her hand    Currently in Pain? Yes    Pain Score 3     Pain Location Knee    Pain Orientation Left    Aggravating Factors  activity                               [facility] Adult PT Treatment/Exercise - 05/19/21 0001       Knee/Hip Exercises: Stretches   Passive Hamstring Stretch Left;3 reps;20 seconds    Gastroc Stretch Both;3 reps;20 seconds      Knee/Hip Exercises: Aerobic   Nustep level 4 x 4 minutes      Knee/Hip Exercises: Seated   Ball Squeeze 20    Other Seated Knee/Hip Exercises right ankle red tband exercises -    Hamstring Curl Both;2 sets;10 reps    Hamstring Limitations red tband      Cryotherapy   Number Minutes Cryotherapy 10 Minutes    Cryotherapy Location Knee    Type of Cryotherapy Ice pack      Electrical Stimulation   Electrical Stimulation Location left knee    Electrical Stimulation Action IFC    Electrical Stimulation Parameters sitting    Electrical Stimulation Goals Pain                       PT Short Term Goals - 04/15/21 1638       PT SHORT TERM GOAL #1   Title independent with initial HEP    Status Achieved               PT Long Term Goals - 05/19/21 1655       PT LONG TERM GOAL #1   Title decrease pain 50%    Status Partially Met                   Plan - 05/19/21 1654     Clinical Impression Statement Patient reports that the last treatment on the knee really helped, she will be having hand surgery next week and will not be in to see [place], I elected to do the same modalities and the light exericses  and then we will continue after her surgery    PT Next Visit Plan work on gait, strength address pain as needed    Consulted and Agree  with Plan of Care Patient             Patient will benefit from skilled therapeutic intervention in order to improve the following deficits and impairments:  Abnormal gait, Decreased range of motion, Decreased balance, Decreased scar mobility, Impaired flexibility, Decreased strength, Decreased  mobility, Decreased endurance, Pain, Cardiopulmonary status limiting activity, Decreased activity tolerance, Postural dysfunction, Improper body mechanics, Increased muscle spasms, Difficulty walking  Visit Diagnosis: Acute bilateral low back pain without sciatica  Muscle weakness (generalized)  Difficulty in walking, not elsewhere classified  Muscle spasm of back     Problem List Patient Active Problem List   Diagnosis Date Noted   Myelopathy concurrent with and due to spinal stenosis of thoracic region ([place]) 10/27/2020   S/P total knee arthroplasty, right 12/23/2019   Acalculous cholecystitis 08/24/2018   Osteoarthritis of right knee    Vertigo    DDD (degenerative disc disease), cervical    Benign essential HTN    History of DVT (deep vein thrombosis)    Tachycardia    Steroid-induced hyperglycemia    Hypothyroidism    Neuropathic pain    Acute blood loss anemia    S/P lumbar fusion    Closed L5 vertebral fracture ([place]) 08/31/2017   Closed fracture of fifth lumbar vertebra ([place]) 08/30/2017   Lumbar vertebral fracture ([place]) 07/17/2017   Scoliosis 06/13/2017    Sumner Boast, PT 05/19/2021, 4:56 PM  [place]. [place], [place], [address] Phone: [phone]   Fax:  [phone]  Name: FARRON LAFOND MRN: [id number] Date of Birth: [birth year]

## 2021-05-21 ENCOUNTER — Ambulatory Visit (HOSPITAL_BASED_OUTPATIENT_CLINIC_OR_DEPARTMENT_OTHER): Payer: Medicare Other | Admitting: Certified Registered"

## 2021-05-21 ENCOUNTER — Encounter (HOSPITAL_BASED_OUTPATIENT_CLINIC_OR_DEPARTMENT_OTHER): Payer: Self-pay | Admitting: Orthopedic Surgery

## 2021-05-21 ENCOUNTER — Encounter (HOSPITAL_BASED_OUTPATIENT_CLINIC_OR_DEPARTMENT_OTHER)
Admission: RE | Disposition: A | Discharge status: Home / Self Care | Payer: Self-pay | Source: Ambulatory Visit | Attending: Orthopedic Surgery

## 2021-05-21 ENCOUNTER — Other Ambulatory Visit: Payer: Self-pay

## 2021-05-21 ENCOUNTER — Ambulatory Visit (HOSPITAL_BASED_OUTPATIENT_CLINIC_OR_DEPARTMENT_OTHER)
Admission: RE | Admit: 2021-05-21 | Discharge: 2021-05-21 | Disposition: A | Discharge status: Home / Self Care | Payer: Medicare Other | Source: Ambulatory Visit | Attending: Orthopedic Surgery | Admitting: Orthopedic Surgery

## 2021-05-21 ENCOUNTER — Other Ambulatory Visit: Payer: Self-pay | Admitting: Orthopedic Surgery

## 2021-05-21 DIAGNOSIS — Z538 Procedure and treatment not carried out for other reasons: Secondary | ICD-10-CM | POA: Diagnosis not present

## 2021-05-21 DIAGNOSIS — M65332 Trigger finger, left middle finger: Secondary | ICD-10-CM | POA: Diagnosis not present

## 2021-05-21 DIAGNOSIS — X58XXXA Exposure to other specified factors, initial encounter: Secondary | ICD-10-CM | POA: Diagnosis not present

## 2021-05-21 DIAGNOSIS — S60522A Blister (nonthermal) of left hand, initial encounter: Secondary | ICD-10-CM | POA: Diagnosis not present

## 2021-05-21 HISTORY — PX: TRIGGER FINGER RELEASE: SHX641

## 2021-05-21 HISTORY — PX: TENOLYSIS: SHX396

## 2021-05-21 SURGERY — RELEASE, A1 PULLEY, FOR TRIGGER FINGER
Anesthesia: Regional | Laterality: Left

## 2021-05-21 MED ORDER — PROPOFOL 500 MG/50ML IV EMUL
INTRAVENOUS | Status: AC
  Filled 2021-05-21: qty 50

## 2021-05-21 MED ORDER — ACETAMINOPHEN 500 MG PO TABS: 1000.0000 mg | ORAL_TABLET | Freq: Once | ORAL | Status: DC

## 2021-05-21 MED ORDER — CLINDAMYCIN PHOSPHATE 900 MG/50ML IV SOLN: 900.0000 mg | INTRAVENOUS | Status: DC

## 2021-05-21 MED ORDER — LACTATED RINGERS IV SOLN: INTRAVENOUS | Status: DC

## 2021-05-21 MED ORDER — CLINDAMYCIN PHOSPHATE 900 MG/50ML IV SOLN
INTRAVENOUS | Status: AC
  Filled 2021-05-21: qty 50

## 2021-05-21 MED ORDER — ACETAMINOPHEN 500 MG PO TABS
ORAL_TABLET | ORAL | Status: AC
  Filled 2021-05-21: qty 2

## 2021-05-21 SURGICAL SUPPLY — 63 items
BAG DECANTER FOR FLEXI CONT (MISCELLANEOUS) IMPLANT
BLADE MINI RND TIP GREEN BEAV (BLADE) IMPLANT
BLADE SURG 15 STRL LF DISP TIS (BLADE) ×1 IMPLANT
BLADE SURG 15 STRL SS (BLADE) ×2
BNDG COHESIVE 2X5 TAN ST LF (GAUZE/BANDAGES/DRESSINGS) ×2 IMPLANT
BNDG COHESIVE 3X5 TAN ST LF (GAUZE/BANDAGES/DRESSINGS) ×2 IMPLANT
BNDG ESMARK 4X9 LF (GAUZE/BANDAGES/DRESSINGS) IMPLANT
BNDG GAUZE ELAST 4 BULKY (GAUZE/BANDAGES/DRESSINGS) ×2 IMPLANT
CHLORAPREP W/TINT 26 (MISCELLANEOUS) ×2 IMPLANT
CORD BIPOLAR FORCEPS 12FT (ELECTRODE) ×2 IMPLANT
COTTONBALL LRG STERILE PKG (GAUZE/BANDAGES/DRESSINGS) IMPLANT
COVER BACK TABLE 60X90IN (DRAPES) ×2 IMPLANT
COVER MAYO STAND STRL (DRAPES) ×2 IMPLANT
CUFF TOURN SGL QUICK 18X4 (TOURNIQUET CUFF) IMPLANT
DRAPE EXTREMITY T 121X128X90 (DISPOSABLE) ×2 IMPLANT
DRAPE OEC MINIVIEW 54X84 (DRAPES) IMPLANT
DRAPE SURG 17X23 STRL (DRAPES) ×2 IMPLANT
GAUZE 4X4 16PLY ~~LOC~~+RFID DBL (SPONGE) IMPLANT
GAUZE SPONGE 4X4 12PLY STRL (GAUZE/BANDAGES/DRESSINGS) ×2 IMPLANT
GAUZE XEROFORM 1X8 LF (GAUZE/BANDAGES/DRESSINGS) ×2 IMPLANT
GLOVE SURG ORTHO LTX SZ8 (GLOVE) ×2 IMPLANT
GLOVE SURG UNDER POLY LF SZ8.5 (GLOVE) ×2 IMPLANT
GOWN STRL REUS W/ TWL LRG LVL3 (GOWN DISPOSABLE) ×1 IMPLANT
GOWN STRL REUS W/TWL LRG LVL3 (GOWN DISPOSABLE) ×2
GOWN STRL REUS W/TWL XL LVL3 (GOWN DISPOSABLE) ×2 IMPLANT
LOOP VESSEL MAXI BLUE (MISCELLANEOUS) IMPLANT
NEEDLE HYPO 22GX1.5 SAFETY (NEEDLE) IMPLANT
NEEDLE HYPO 27GX1-1/4 (NEEDLE) ×2 IMPLANT
NEEDLE KEITH (NEEDLE) IMPLANT
NS IRRIG 1000ML POUR BTL (IV SOLUTION) ×2 IMPLANT
PACK BASIN DAY SURGERY FS (CUSTOM PROCEDURE TRAY) ×2 IMPLANT
PAD CAST 3X4 CTTN HI CHSV (CAST SUPPLIES) ×1 IMPLANT
PAD CAST 4YDX4 CTTN HI CHSV (CAST SUPPLIES) IMPLANT
PADDING CAST ABS 3INX4YD NS (CAST SUPPLIES)
PADDING CAST ABS 4INX4YD NS (CAST SUPPLIES) ×1
PADDING CAST ABS COTTON 3X4 (CAST SUPPLIES) IMPLANT
PADDING CAST ABS COTTON 4X4 ST (CAST SUPPLIES) ×1 IMPLANT
PADDING CAST COTTON 3X4 STRL (CAST SUPPLIES) ×2
PADDING CAST COTTON 4X4 STRL (CAST SUPPLIES)
SLEEVE SCD COMPRESS KNEE MED (STOCKING) IMPLANT
SPIKE FLUID TRANSFER (MISCELLANEOUS) IMPLANT
SPLINT PLASTER CAST XFAST 3X15 (CAST SUPPLIES) IMPLANT
SPLINT PLASTER XTRA FASTSET 3X (CAST SUPPLIES)
STOCKINETTE 4X48 STRL (DRAPES) ×2 IMPLANT
SUT CHROMIC 5 0 P 3 (SUTURE) IMPLANT
SUT ETHIBOND 3-0 V-5 (SUTURE) IMPLANT
SUT ETHILON 4 0 PS 2 18 (SUTURE) IMPLANT
SUT MERSILENE 2.0 SH NDLE (SUTURE) IMPLANT
SUT MERSILENE 4 0 P 3 (SUTURE) IMPLANT
SUT PROLENE 2 0 SH DA (SUTURE) IMPLANT
SUT SILK 4 0 PS 2 (SUTURE) IMPLANT
SUT STEEL 3 0 (SUTURE) IMPLANT
SUT VIC AB 3-0 PS1 18 (SUTURE)
SUT VIC AB 3-0 PS1 18XBRD (SUTURE) IMPLANT
SUT VIC AB 4-0 P-3 18XBRD (SUTURE) IMPLANT
SUT VIC AB 4-0 P2 18 (SUTURE) IMPLANT
SUT VIC AB 4-0 P3 18 (SUTURE)
SUT VICRYL 4-0 PS2 18IN ABS (SUTURE) IMPLANT
SYR BULB EAR ULCER 3OZ GRN STR (SYRINGE) ×2 IMPLANT
SYR CONTROL 10ML LL (SYRINGE) ×2 IMPLANT
TOWEL GREEN STERILE FF (TOWEL DISPOSABLE) ×4 IMPLANT
TUBE FEEDING ENTERAL 5FR 16IN (TUBING) IMPLANT
UNDERPAD 30X36 HEAVY ABSORB (UNDERPADS AND DIAPERS) ×2 IMPLANT

## 2021-05-21 NOTE — Anesthesia Preprocedure Evaluation (Deleted)
Anesthesia Evaluation  Patient identified by MRN, date of birth, ID band Patient awake    Reviewed: Allergy & Precautions, NPO status , Patient's Chart, lab work & pertinent test results  History of Anesthesia Complications (+) PONV and history of anesthetic complications  Airway Mallampati: II  TM Distance: >3 FB Neck ROM: Full    Dental  (+) Partial Upper, Partial Lower   Pulmonary former smoker,    Pulmonary exam normal        Cardiovascular + DVT  Normal cardiovascular exam     Neuro/Psych negative neurological ROS  negative psych ROS   GI/Hepatic Neg liver ROS, GERD  Medicated and Controlled,  Endo/Other  Hypothyroidism   Renal/GU negative Renal ROS     Musculoskeletal  (+) Arthritis ,   Abdominal   Peds  Hematology negative hematology ROS (+)   Anesthesia Other Findings TRIGGER LEFT MIDDLE FINGER  EXTENSOR POLLICIS LONGUS TENDONITIS LEFT  Reproductive/Obstetrics                            Anesthesia Physical Anesthesia Plan  ASA: 2  Anesthesia Plan: Bier Block and Bier Block-LIDOCAINE ONLY   Post-op Pain Management:    Induction: Intravenous  PONV Risk Score and Plan: 3 and Ondansetron, Dexamethasone, Propofol infusion and Treatment may vary due to age or medical condition  Airway Management Planned: Simple Face Mask  Additional Equipment:   Intra-op Plan:   Post-operative Plan:   Informed Consent: I have reviewed the patients History and Physical, chart, labs and discussed the procedure including the risks, benefits and alternatives for the proposed anesthesia with the patient or authorized representative who has indicated his/her understanding and acceptance.     Dental advisory given  Plan Discussed with: CRNA  Anesthesia Plan Comments:         Anesthesia Quick Evaluation

## 2021-05-21 NOTE — H&P (Signed)
[name] is an 88 y.o. female.   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 Is complaining of peeling of her right index finger nail distally. She states that the trigger has been going on for 6 months she is also going a mass of the dorsal aspect of the left wrist which is slowly enlarged has been present for years. She recalls no history of trauma. She is not complaining of significant pain with the triggering. It does not awaken her at night. She is not complain of any numbness or tingling. She has a history of thyroid problems arthritis no history of diabetes or gout. Family history is negative for each. She has had a fusion of the distal phalangeal joint of her right index finger. She was referred for ultrasound at that time. We had discussed the possibility of release of the A1 pulley and she elected to have examination of the mass to see if it could removed be removed at the same time. Dr. Nelia Shi has done the ultrasound revealing a tenosynovitis of the extensor carpi radialis brevis extensor pollicis longus tendons at the crossing along with a stenosing tenosynovitis of the middle finger. Complains of moderate discomfort on the dorsal aspect and continued triggering of her middle finger left hand Past Medical History:  Diagnosis Date   Acalculous cholecystitis 08/24/2018   Arthritis    DVT of lower extremity (deep venous thrombosis) (HCC)    right leg - after back surgery in 2019   GERD (gastroesophageal reflux disease)    History of blood transfusion    Hypothyroidism    PONV (postoperative nausea and vomiting)    Ptosis of both eyelids    Vertigo    Wears glasses    reading    Past Surgical History:  Procedure Laterality Date   ABDOMINAL HYSTERECTOMY  6606   APPLICATION OF ROBOTIC ASSISTANCE FOR SPINAL PROCEDURE N/A  07/17/2017   Procedure: APPLICATION OF ROBOTIC ASSISTANCE FOR SPINAL PROCEDURE;  Surgeon: Kristeen Miss, MD;  Location: [place];  Service: Neurosurgery;  Laterality: N/A;   APPLICATION OF ROBOTIC ASSISTANCE FOR SPINAL PROCEDURE N/A 09/01/2017   Procedure: APPLICATION OF ROBOTIC ASSISTANCE FOR SPINAL PROCEDURE;  Surgeon: Kristeen Miss, MD;  Location: [place];  Service: Neurosurgery;  Laterality: N/A;   APPLICATION OF ROBOTIC ASSISTANCE FOR SPINAL PROCEDURE N/A 10/27/2020   Procedure: APPLICATION OF ROBOTIC ASSISTANCE FOR SPINAL PROCEDURE;  Surgeon: Kristeen Miss, MD;  Location: [place];  Service: Neurosurgery;  Laterality: N/A;   BACK SURGERY  1986   lumb lam   CARPAL TUNNEL RELEASE  2000   right   CARPAL TUNNEL RELEASE  2012   left   CHOLECYSTECTOMY N/A 08/24/2018   Procedure: LAPAROSCOPIC CHOLECYSTECTOMY WITH INTRAOPERATIVE CHOLANGIOGRAM;  Surgeon: Fanny Skates, MD;  Location: [facility] OR;  Service: General;  Laterality: N/A;   COLONOSCOPY     CYSTOCELE REPAIR  2007   sling   DISTAL INTERPHALANGEAL JOINT FUSION Right 02/04/2014   Procedure: FUSION DISTAL INTERPHALANGEAL JOINT RIGHT INDEX FINGER ;  Surgeon: Daryll Brod, MD;  Location: [place];  Service: Orthopedics;  Laterality: Right;   EYE SURGERY Bilateral    Cataract surgery with lens implant   KNEE SURGERY  2009,2011   partial knee   LAPAROSCOPY N/A 08/24/2018   Procedure: LAPAROSCOPY DIAGNOSTIC;  Surgeon: Fanny Skates, MD;  Location:  [facility];  Service: General;  Laterality: N/A;   LUMBAR PERCUTANEOUS PEDICLE SCREW 4 LEVEL N/A 10/27/2020   Procedure: Thoracic nine-ten Laminectomy with extension of fusion from Thoracic ten to Thoracic six with segmental fixation (cemented) with pedicle screw and mazor;  Surgeon: Kristeen Miss, MD;  Location: [facility];  Service: Neurosurgery;  Laterality: N/A;   OPEN REDUCTION INTERNAL FIXATION (ORIF) PROXIMAL PHALANX Left 08/30/2013   Procedure: OPEN REDUCTION INTERNAL FIXATION (ORIF) PROXIMAL PHALANX  FRACTURE LEFT SMALL FINGER; SPLINT RING FINGER;  Surgeon: Wynonia Sours, MD;  Location: [place];  Service: Orthopedics;  Laterality: Left;   POSTERIOR LUMBAR FUSION 4 LEVEL N/A 07/17/2017   Procedure: Thoracic Ten - Lumbar Five revison of hardware with Mazor;  Surgeon: Kristeen Miss, MD;  Location: [place];  Service: Neurosurgery;  Laterality: N/A;  Thoracic/Lumbar   PTOSIS REPAIR Bilateral 07/27/2015   Procedure: PTOSIS REPAIR;  Surgeon: Cristine Polio, MD;  Location: [place];  Service: Plastics;  Laterality: Bilateral;   SHOULDER SURGERY     rt rcr,and lt   TONSILLECTOMY     TOTAL KNEE ARTHROPLASTY Right 12/23/2019   Procedure: TOTAL KNEE ARTHROPLASTY;  Surgeon: Vickey Huger, MD;  [facility]: [facility];  Service: Orthopedics;  Laterality: Right;    Family History  Problem Relation Age of Onset   Diabetes Sister    Breast cancer Neg Hx    Social History:  reports that she has quit smoking. She has never used smokeless tobacco. She reports that she does not drink alcohol and does not use drugs.  Allergies:  Allergies  Allergen Reactions   Keflex [Cephalexin] Hives    No medications prior to admission.    No results found for this or any previous visit (from the past 48 hour(s)).  No results found.   Pertinent items are noted in HPI.  Height 5\' 6"  (1.676 m), weight 77.1 kg.    Assessment/Plan Assessment:  1. Extensor intersection syndrome of left wrist   2. Trigger middle finger of left hand     Plan: She would like to proceed to have the trigger finger release would recommend is a release of the EPL tendon at the same time. This may require translocation of the tendon from Lister's tubercle. Prepare postoperative course are discussed along with risk and complications. She is aware there is no guarantee to the surgery possibility of infection recurrence injury to arteries nerves tendons incomplete relief symptoms and dystrophy. She is scheduled  for release A1 pulley and EPL tendon left hand and wrist as an outpatient under regional anesthesia.  Patient has a wound on her operative hand in line of the incision.  Occurred yesterday is open.  We will have her reschedule for a week to 10 days due to the increased possibility of infection. Daryll Brod 05/21/2021, 5:12 AM

## 2021-05-21 NOTE — Progress Notes (Signed)
Pt presents with blister on operative hand. After evaluation, Dr Fredna Dow determined it would be best to let the wound heal first and reschedule surgery after that. Dr Fredna Dow discussed with pt who verbalized understanding. Called pt's husband who will return to pick patient up. Patient given snack while waiting.

## 2021-05-24 ENCOUNTER — Encounter (HOSPITAL_BASED_OUTPATIENT_CLINIC_OR_DEPARTMENT_OTHER): Payer: Self-pay | Admitting: Orthopedic Surgery

## 2021-05-25 ENCOUNTER — Encounter: Payer: Self-pay | Admitting: Physical Therapy

## 2021-05-25 ENCOUNTER — Ambulatory Visit: Payer: Medicare Other | Admitting: Physical Therapy

## 2021-05-25 ENCOUNTER — Other Ambulatory Visit: Payer: Self-pay

## 2021-05-25 DIAGNOSIS — M545 Low back pain, unspecified: Secondary | ICD-10-CM | POA: Diagnosis not present

## 2021-05-25 DIAGNOSIS — M6281 Muscle weakness (generalized): Secondary | ICD-10-CM

## 2021-05-25 DIAGNOSIS — M546 Pain in thoracic spine: Secondary | ICD-10-CM

## 2021-05-25 DIAGNOSIS — R262 Difficulty in walking, not elsewhere classified: Secondary | ICD-10-CM

## 2021-05-25 DIAGNOSIS — M62830 Muscle spasm of back: Secondary | ICD-10-CM

## 2021-05-25 NOTE — Therapy (Signed)
[facility]. [place], [place], [address] Phone: [phone]   Fax:  [phone]  Physical Therapy Treatment  Patient Details  Name: [name] MRN: [id number] Date of Birth: [birth year] Referring Provider (PT): Elsner   Encounter Date: 05/25/2021   PT End of Session - 05/25/21 1742     Visit Number 28    Date for PT Re-Evaluation 06/14/21    Authorization Type U[employer] medicare    PT Start Time 1659    PT Stop Time 3546    PT Time Calculation (min) 44 min    Activity Tolerance Patient tolerated treatment well    Behavior During Therapy [facility] for tasks assessed/performed             Past Medical History:  Diagnosis Date   Acalculous cholecystitis 08/24/2018   Arthritis    DVT of lower extremity (deep venous thrombosis) (HCC)    right leg - after back surgery in 2019   GERD (gastroesophageal reflux disease)    History of blood transfusion    Hypothyroidism    PONV (postoperative nausea and vomiting)    Ptosis of both eyelids    Vertigo    Wears glasses    reading    Past Surgical History:  Procedure Laterality Date   ABDOMINAL HYSTERECTOMY  5681   APPLICATION OF ROBOTIC ASSISTANCE FOR SPINAL PROCEDURE N/A 07/17/2017   Procedure: APPLICATION OF ROBOTIC ASSISTANCE FOR SPINAL PROCEDURE;  Surgeon: Kristeen Miss, MD;  Location: [place];  Service: Neurosurgery;  Laterality: N/A;   APPLICATION OF ROBOTIC ASSISTANCE FOR SPINAL PROCEDURE N/A 09/01/2017   Procedure: APPLICATION OF ROBOTIC ASSISTANCE FOR SPINAL PROCEDURE;  Surgeon: Kristeen Miss, MD;  Location: [place];  Service: Neurosurgery;  Laterality: N/A;   APPLICATION OF ROBOTIC ASSISTANCE FOR SPINAL PROCEDURE N/A 10/27/2020   Procedure: APPLICATION OF ROBOTIC ASSISTANCE FOR SPINAL PROCEDURE;  Surgeon: Kristeen Miss, MD;  Location: [place];  Service: Neurosurgery;  Laterality: N/A;   BACK SURGERY  1986   lumb lam   CARPAL TUNNEL RELEASE  2000   right   CARPAL  TUNNEL RELEASE  2012   left   CHOLECYSTECTOMY N/A 08/24/2018   Procedure: LAPAROSCOPIC CHOLECYSTECTOMY WITH INTRAOPERATIVE CHOLANGIOGRAM;  Surgeon: Fanny Skates, MD;  Location: [facility] OR;  Service: General;  Laterality: N/A;   COLONOSCOPY     CYSTOCELE REPAIR  2007   sling   DISTAL INTERPHALANGEAL JOINT FUSION Right 02/04/2014   Procedure: FUSION DISTAL INTERPHALANGEAL JOINT RIGHT INDEX FINGER ;  Surgeon: Daryll Brod, MD;  Location: [place];  Service: Orthopedics;  Laterality: Right;   EYE SURGERY Bilateral    Cataract surgery with lens implant   KNEE SURGERY  2009,2011   partial knee   LAPAROSCOPY N/A 08/24/2018   Procedure: LAPAROSCOPY DIAGNOSTIC;  Surgeon: Fanny Skates, MD;  Location: [place];  Service: General;  Laterality: N/A;   LUMBAR PERCUTANEOUS PEDICLE SCREW 4 LEVEL N/A 10/27/2020   Procedure: Thoracic nine-ten Laminectomy with extension of fusion from Thoracic ten to Thoracic six with segmental fixation (cemented) with pedicle screw and mazor;  Surgeon: Kristeen Miss, MD;  Location: [place];  Service: Neurosurgery;  Laterality: N/A;   OPEN REDUCTION INTERNAL FIXATION (ORIF) PROXIMAL PHALANX Left 08/30/2013   Procedure: OPEN REDUCTION INTERNAL FIXATION (ORIF) PROXIMAL PHALANX FRACTURE LEFT SMALL FINGER; SPLINT RING FINGER;  Surgeon: Wynonia Sours, MD;  Location: [place];  Service: Orthopedics;  Laterality: Left;   POSTERIOR LUMBAR FUSION 4 LEVEL N/A  07/17/2017   Procedure: Thoracic Ten - Lumbar Five revison of hardware with Mazor;  Surgeon: Kristeen Miss, MD;  Location: [place];  Service: Neurosurgery;  Laterality: N/A;  Thoracic/Lumbar   PTOSIS REPAIR Bilateral 07/27/2015   Procedure: PTOSIS REPAIR;  Surgeon: Cristine Polio, MD;  Location: [place];  Service: Plastics;  Laterality: Bilateral;   SHOULDER SURGERY     rt rcr,and lt   TENOLYSIS Left 05/21/2021   Procedure: RELEASE EXTENSOR POLLICIS LONGUS LEFT;  Surgeon: Daryll Brod, MD;   Location: [place];  Service: Orthopedics;  Laterality: Left;   TONSILLECTOMY     TOTAL KNEE ARTHROPLASTY Right 12/23/2019   Procedure: TOTAL KNEE ARTHROPLASTY;  Surgeon: Vickey Huger, MD;  [facility];  Service: Orthopedics;  Laterality: Right;   TRIGGER FINGER RELEASE Left 05/21/2021   Procedure: RELEASE TRIGGER FINGER/A-1 PULLEY LEFT MIDDLE FINGER;  Surgeon: Daryll Brod, MD;  Location: [place];  Service: Orthopedics;  Laterality: Left;    There were no vitals filed for this visit.   Subjective Assessment - 05/25/21 1707     Subjective Patient reports that her back is hurting and the knee is hurting a little, she reports that she has been up on her feet    Currently in Pain? Yes    Pain Score 5     Pain Location Back                               [facility] Adult PT Treatment/Exercise - 05/25/21 0001       Knee/Hip Exercises: Stretches   Passive Hamstring Stretch Left;3 reps;20 seconds    Gastroc Stretch Both;3 reps;20 seconds      Knee/Hip Exercises: Aerobic   Recumbent Bike level 3 x 5 minutes    Nustep level 4 x 5 minutes      Knee/Hip Exercises: Seated   Ball Squeeze 20    Other Seated Knee/Hip Exercises scap stabilization seated row and extension, pelvic clocks    Hamstring Curl Both;2 sets;10 reps    Hamstring Limitations red tband                       PT Short Term Goals - 04/15/21 1638       PT SHORT TERM GOAL #1   Title independent with initial HEP    Status Achieved               PT Long Term Goals - 05/25/21 1747       PT LONG TERM GOAL #1   Title decrease pain 50%    Status Partially Met                   Plan - 05/25/21 1743     Clinical Impression Statement Patient with some increased LBP and some increase of left knee pain.  She reports that she has not been sleeping well because she is trying to keep her husband in his Cpap machine and she has been up doing more  things around the house, she reports using the [facility] for most ambulation.  She did not get to have her hand surgery so this has not been completed and will have to do this in the near future    PT Next Visit Plan work on gait, strength address pain as needed    Consulted and Agree with Plan of Care Patient  Patient will benefit from skilled therapeutic intervention in order to improve the following deficits and impairments:  Abnormal gait, Decreased range of motion, Decreased balance, Decreased scar mobility, Impaired flexibility, Decreased strength, Decreased mobility, Decreased endurance, Pain, Cardiopulmonary status limiting activity, Decreased activity tolerance, Postural dysfunction, Improper body mechanics, Increased muscle spasms, Difficulty walking  Visit Diagnosis: Acute bilateral low back pain without sciatica  Muscle weakness (generalized)  Difficulty in walking, not elsewhere classified  Muscle spasm of back  Pain in thoracic spine     Problem List Patient Active Problem List   Diagnosis Date Noted   Myelopathy concurrent with and due to spinal stenosis of thoracic region ([place]) 10/27/2020   S/P total knee arthroplasty, right 12/23/2019   Acalculous cholecystitis 08/24/2018   Osteoarthritis of right knee    Vertigo    DDD (degenerative disc disease), cervical    Benign essential HTN    History of DVT (deep vein thrombosis)    Tachycardia    Steroid-induced hyperglycemia    Hypothyroidism    Neuropathic pain    Acute blood loss anemia    S/P lumbar fusion    Closed L5 vertebral fracture (HCC) 08/31/2017   Closed fracture of fifth lumbar vertebra ([place]) 08/30/2017   Lumbar vertebral fracture ([place]) 07/17/2017   Scoliosis 06/13/2017    Sumner Boast, PT 05/25/2021, 5:47 PM  [address]. [place], [place], [address] Phone: [phone]   Fax:  [phone]  Name: [name] MRN: [id number] Date of Birth: 1932/08/09

## 2021-06-01 ENCOUNTER — Other Ambulatory Visit: Payer: Self-pay

## 2021-06-01 ENCOUNTER — Encounter (HOSPITAL_BASED_OUTPATIENT_CLINIC_OR_DEPARTMENT_OTHER): Payer: Self-pay | Admitting: Orthopedic Surgery

## 2021-06-01 ENCOUNTER — Ambulatory Visit: Payer: Medicare Other | Admitting: Physical Therapy

## 2021-06-01 ENCOUNTER — Encounter: Payer: Self-pay | Admitting: Physical Therapy

## 2021-06-01 DIAGNOSIS — M546 Pain in thoracic spine: Secondary | ICD-10-CM

## 2021-06-01 DIAGNOSIS — R262 Difficulty in walking, not elsewhere classified: Secondary | ICD-10-CM

## 2021-06-01 DIAGNOSIS — M62830 Muscle spasm of back: Secondary | ICD-10-CM

## 2021-06-01 DIAGNOSIS — M545 Low back pain, unspecified: Secondary | ICD-10-CM

## 2021-06-01 DIAGNOSIS — M6281 Muscle weakness (generalized): Secondary | ICD-10-CM

## 2021-06-01 NOTE — Therapy (Signed)
[address]. [place], [place], [address] Phone: [phone]   Fax:  [phone]  Physical Therapy Treatment  Patient Details  Name: [name] MRN: [id number] Date of Birth: Jul 12, 1932 Referring Provider (PT): Elsner   Encounter Date: 06/01/2021   PT End of Session - 06/01/21 1657     Visit Number 29    Date for PT Re-Evaluation 06/14/21    Authorization Type UHC medicare    PT Start Time 1605    PT Stop Time 1655    PT Time Calculation (min) 50 min    Activity Tolerance Patient tolerated treatment well    Behavior During Therapy [facility] for tasks assessed/performed             Past Medical History:  Diagnosis Date   Acalculous cholecystitis 08/24/2018   Arthritis    DVT of lower extremity (deep venous thrombosis) (HCC)    right leg - after back surgery in 2019   GERD (gastroesophageal reflux disease)    History of blood transfusion    Hypothyroidism    PONV (postoperative nausea and vomiting)    Ptosis of both eyelids    Vertigo    Wears glasses    reading    Past Surgical History:  Procedure Laterality Date   ABDOMINAL HYSTERECTOMY  7341   APPLICATION OF ROBOTIC ASSISTANCE FOR SPINAL PROCEDURE N/A 07/17/2017   Procedure: APPLICATION OF ROBOTIC ASSISTANCE FOR SPINAL PROCEDURE;  Surgeon: Kristeen Miss, MD;  Location: [place];  Service: Neurosurgery;  Laterality: N/A;   APPLICATION OF ROBOTIC ASSISTANCE FOR SPINAL PROCEDURE N/A 09/01/2017   Procedure: APPLICATION OF ROBOTIC ASSISTANCE FOR SPINAL PROCEDURE;  Surgeon: Kristeen Miss, MD;  Location: [place];  Service: Neurosurgery;  Laterality: N/A;   APPLICATION OF ROBOTIC ASSISTANCE FOR SPINAL PROCEDURE N/A 10/27/2020   Procedure: APPLICATION OF ROBOTIC ASSISTANCE FOR SPINAL PROCEDURE;  Surgeon: Kristeen Miss, MD;  Location: [place];  Service: Neurosurgery;  Laterality: N/A;   BACK SURGERY  1986   lumb lam   CARPAL TUNNEL RELEASE  2000   right   CARPAL  TUNNEL RELEASE  2012   left   CHOLECYSTECTOMY N/A 08/24/2018   Procedure: LAPAROSCOPIC CHOLECYSTECTOMY WITH INTRAOPERATIVE CHOLANGIOGRAM;  Surgeon: Fanny Skates, MD;  Location: [facility] OR;  Service: General;  Laterality: N/A;   COLONOSCOPY     CYSTOCELE REPAIR  2007   sling   DISTAL INTERPHALANGEAL JOINT FUSION Right 02/04/2014   Procedure: FUSION DISTAL INTERPHALANGEAL JOINT RIGHT INDEX FINGER ;  Surgeon: Daryll Brod, MD;  Location: [place];  Service: Orthopedics;  Laterality: Right;   EYE SURGERY Bilateral    Cataract surgery with lens implant   KNEE SURGERY  2009,2011   partial knee   LAPAROSCOPY N/A 08/24/2018   Procedure: LAPAROSCOPY DIAGNOSTIC;  Surgeon: Fanny Skates, MD;  Location: [place];  Service: General;  Laterality: N/A;   LUMBAR PERCUTANEOUS PEDICLE SCREW 4 LEVEL N/A 10/27/2020   Procedure: Thoracic nine-ten Laminectomy with extension of fusion from Thoracic ten to Thoracic six with segmental fixation (cemented) with pedicle screw and mazor;  Surgeon: Kristeen Miss, MD;  Location: [place];  Service: Neurosurgery;  Laterality: N/A;   OPEN REDUCTION INTERNAL FIXATION (ORIF) PROXIMAL PHALANX Left 08/30/2013   Procedure: OPEN REDUCTION INTERNAL FIXATION (ORIF) PROXIMAL PHALANX FRACTURE LEFT SMALL FINGER; SPLINT RING FINGER;  Surgeon: Wynonia Sours, MD;  Location: [place];  Service: Orthopedics;  Laterality: Left;   POSTERIOR LUMBAR FUSION 4 LEVEL N/A  07/17/2017   Procedure: Thoracic Ten - Lumbar Five revison of hardware with Mazor;  Surgeon: Kristeen Miss, MD;  Location: [place];  Service: Neurosurgery;  Laterality: N/A;  Thoracic/Lumbar   PTOSIS REPAIR Bilateral 07/27/2015   Procedure: PTOSIS REPAIR;  Surgeon: Cristine Polio, MD;  Location: [place];  Service: Plastics;  Laterality: Bilateral;   SHOULDER SURGERY     rt rcr,and lt   TENOLYSIS Left 05/21/2021   Procedure: RELEASE EXTENSOR POLLICIS LONGUS LEFT;  Surgeon: Daryll Brod, MD;   Location: [place];  Service: Orthopedics;  Laterality: Left;   TONSILLECTOMY     TOTAL KNEE ARTHROPLASTY Right 12/23/2019   Procedure: TOTAL KNEE ARTHROPLASTY;  Surgeon: Vickey Huger, MD;  [facility];  Service: Orthopedics;  Laterality: Right;   TRIGGER FINGER RELEASE Left 05/21/2021   Procedure: RELEASE TRIGGER FINGER/A-1 PULLEY LEFT MIDDLE FINGER;  Surgeon: Daryll Brod, MD;  Location: [place];  Service: Orthopedics;  Laterality: Left;    There were no vitals filed for this visit.   Subjective Assessment - 06/01/21 1603     Subjective Reports that she has been using the [facility] for the past week, she reports that she is very careful    Currently in Pain? Yes    Pain Score 3     Pain Location Back    Pain Orientation Left    Pain Descriptors / Indicators Sore;Tightness                               [facility] Adult PT Treatment/Exercise - 06/01/21 0001       Ambulation/Gait   Gait Comments used two SPC's and had her walk outside in the grass, up and down curbs, stairs  with CGA      High Level Balance   High Level Balance Activities Side stepping;Backward walking    High Level Balance Comments beach ball toss on and off airex, 3 cone walking around, cone toe taps with SPC      Knee/Hip Exercises: Stretches   Passive Hamstring Stretch Left;3 reps;20 seconds    Gastroc Stretch Both;3 reps;20 seconds      Knee/Hip Exercises: Aerobic   Recumbent Bike level 3 x 5 minutes    Nustep level 4 x 5 minutes      Knee/Hip Exercises: Machines for Strengthening   Cybex Knee Extension 10# 2x10    Cybex Knee Flexion 35lb 2x10      Knee/Hip Exercises: Seated   Ball Squeeze 20    Hamstring Curl Both;2 sets;10 reps                       PT Short Term Goals - 04/15/21 1638       PT SHORT TERM GOAL #1   Title independent with initial HEP    Status Achieved               PT Long Term Goals - 05/25/21 1747        PT LONG TERM GOAL #1   Title decrease pain 50%    Status Partially Met                   Plan - 06/01/21 1657     Clinical Impression Statement I am working on balance and progressing gait to a SPC, she would like to visit cemetary where her daughter is buried, reports that she cannot do  the walker due to she has to lift it.  She did very well with two SPC's today, did some higher level balance and again did well, still needs CGA    PT Next Visit Plan work on gait, strength and balance address pain as needed    Consulted and Agree with Plan of Care Patient             Patient will benefit from skilled therapeutic intervention in order to improve the following deficits and impairments:  Abnormal gait, Decreased range of motion, Decreased balance, Decreased scar mobility, Impaired flexibility, Decreased strength, Decreased mobility, Decreased endurance, Pain, Cardiopulmonary status limiting activity, Decreased activity tolerance, Postural dysfunction, Improper body mechanics, Increased muscle spasms, Difficulty walking  Visit Diagnosis: Acute bilateral low back pain without sciatica  Muscle weakness (generalized)  Difficulty in walking, not elsewhere classified  Muscle spasm of back  Pain in thoracic spine     Problem List Patient Active Problem List   Diagnosis Date Noted   Myelopathy concurrent with and due to spinal stenosis of thoracic region ([place]) 10/27/2020   S/P total knee arthroplasty, right 12/23/2019   Acalculous cholecystitis 08/24/2018   Osteoarthritis of right knee    Vertigo    DDD (degenerative disc disease), cervical    Benign essential HTN    History of DVT (deep vein thrombosis)    Tachycardia    Steroid-induced hyperglycemia    Hypothyroidism    Neuropathic pain    Acute blood loss anemia    S/P lumbar fusion    Closed L5 vertebral fracture ([place]) 08/31/2017   Closed fracture of fifth lumbar vertebra ([place]) 08/30/2017   Lumbar vertebral  fracture ([place]) 07/17/2017   Scoliosis 06/13/2017    Sumner Boast, PT 06/01/2021, 4:59 PM  [place]. [place], [place], [address] Phone: [phone]   Fax:  [phone]  Name: JILDA KRESS MRN: [id number] Date of Birth: July 16, 1932

## 2021-06-03 ENCOUNTER — Other Ambulatory Visit: Payer: Self-pay

## 2021-06-03 ENCOUNTER — Ambulatory Visit: Payer: Medicare Other | Attending: Neurological Surgery | Admitting: Physical Therapy

## 2021-06-03 ENCOUNTER — Encounter: Payer: Self-pay | Admitting: Physical Therapy

## 2021-06-03 DIAGNOSIS — M62830 Muscle spasm of back: Secondary | ICD-10-CM | POA: Diagnosis present

## 2021-06-03 DIAGNOSIS — M545 Low back pain, unspecified: Secondary | ICD-10-CM | POA: Insufficient documentation

## 2021-06-03 DIAGNOSIS — M6281 Muscle weakness (generalized): Secondary | ICD-10-CM | POA: Diagnosis present

## 2021-06-03 DIAGNOSIS — M546 Pain in thoracic spine: Secondary | ICD-10-CM | POA: Insufficient documentation

## 2021-06-03 DIAGNOSIS — R262 Difficulty in walking, not elsewhere classified: Secondary | ICD-10-CM | POA: Insufficient documentation

## 2021-06-03 NOTE — Therapy (Signed)
[address]. [place], [place], [address] Phone: [phone]   Fax:  [phone] Progress Note Reporting Period 04/27/21 to 06/03/21 for visits 21-30  See note below for Objective Data and Assessment of Progress/Goals.     Physical Therapy Treatment  Patient Details  Name: [name] MRN: [id number] Date of Birth: October 09, 1932 Referring Provider (PT): Elsner   Encounter Date: 06/03/2021   PT End of Session - 06/03/21 1651     Visit Number 30    Date for PT Re-Evaluation 06/14/21    Authorization Type UHC medicare    PT Start Time 1608    PT Stop Time 1017    PT Time Calculation (min) 45 min    Activity Tolerance Patient tolerated treatment well    Behavior During Therapy [facility] for tasks assessed/performed             Past Medical History:  Diagnosis Date   Acalculous cholecystitis 08/24/2018   Arthritis    DVT of lower extremity (deep venous thrombosis) (HCC)    right leg - after back surgery in 2019   GERD (gastroesophageal reflux disease)    History of blood transfusion    Hypothyroidism    PONV (postoperative nausea and vomiting)    Ptosis of both eyelids    Vertigo    Wears glasses    reading    Past Surgical History:  Procedure Laterality Date   ABDOMINAL HYSTERECTOMY  5102   APPLICATION OF ROBOTIC ASSISTANCE FOR SPINAL PROCEDURE N/A 07/17/2017   Procedure: APPLICATION OF ROBOTIC ASSISTANCE FOR SPINAL PROCEDURE;  Surgeon: Kristeen Miss, MD;  Location: [place];  Service: Neurosurgery;  Laterality: N/A;   APPLICATION OF ROBOTIC ASSISTANCE FOR SPINAL PROCEDURE N/A 09/01/2017   Procedure: APPLICATION OF ROBOTIC ASSISTANCE FOR SPINAL PROCEDURE;  Surgeon: Kristeen Miss, MD;  Location: [place];  Service: Neurosurgery;  Laterality: N/A;   APPLICATION OF ROBOTIC ASSISTANCE FOR SPINAL PROCEDURE N/A 10/27/2020   Procedure: APPLICATION OF ROBOTIC ASSISTANCE FOR SPINAL PROCEDURE;  Surgeon: Kristeen Miss, MD;   Location: [place];  Service: Neurosurgery;  Laterality: N/A;   BACK SURGERY  1986   lumb lam   CARPAL TUNNEL RELEASE  2000   right   CARPAL TUNNEL RELEASE  2012   left   CHOLECYSTECTOMY N/A 08/24/2018   Procedure: LAPAROSCOPIC CHOLECYSTECTOMY WITH INTRAOPERATIVE CHOLANGIOGRAM;  Surgeon: Fanny Skates, MD;  Location: [facility];  Service: General;  Laterality: N/A;   COLONOSCOPY     CYSTOCELE REPAIR  2007   sling   DISTAL INTERPHALANGEAL JOINT FUSION Right 02/04/2014   Procedure: FUSION DISTAL INTERPHALANGEAL JOINT RIGHT INDEX FINGER ;  Surgeon: Daryll Brod, MD;  Location: [place];  Service: Orthopedics;  Laterality: Right;   EYE SURGERY Bilateral    Cataract surgery with lens implant   KNEE SURGERY  2009,2011   partial knee   LAPAROSCOPY N/A 08/24/2018   Procedure: LAPAROSCOPY DIAGNOSTIC;  Surgeon: Fanny Skates, MD;  Location: [place];  Service: General;  Laterality: N/A;   LUMBAR PERCUTANEOUS PEDICLE SCREW 4 LEVEL N/A 10/27/2020   Procedure: Thoracic nine-ten Laminectomy with extension of fusion from Thoracic ten to Thoracic six with segmental fixation (cemented) with pedicle screw and mazor;  Surgeon: Kristeen Miss, MD;  Location: [place];  Service: Neurosurgery;  Laterality: N/A;   OPEN REDUCTION INTERNAL FIXATION (ORIF) PROXIMAL PHALANX Left 08/30/2013   Procedure: OPEN REDUCTION INTERNAL FIXATION (ORIF) PROXIMAL PHALANX FRACTURE LEFT SMALL FINGER; SPLINT RING FINGER;  Surgeon:  Wynonia Sours, MD;  Location: [place];  Service: Orthopedics;  Laterality: Left;   POSTERIOR LUMBAR FUSION 4 LEVEL N/A 07/17/2017   Procedure: Thoracic Ten - Lumbar Five revison of hardware with Mazor;  Surgeon: Kristeen Miss, MD;  Location: [place];  Service: Neurosurgery;  Laterality: N/A;  Thoracic/Lumbar   PTOSIS REPAIR Bilateral 07/27/2015   Procedure: PTOSIS REPAIR;  Surgeon: Cristine Polio, MD;  Location: [place];  Service: Plastics;  Laterality: Bilateral;    SHOULDER SURGERY     rt rcr,and lt   TENOLYSIS Left 05/21/2021   Procedure: RELEASE EXTENSOR POLLICIS LONGUS LEFT;  Surgeon: Daryll Brod, MD;  Location: [place];  Service: Orthopedics;  Laterality: Left;   TONSILLECTOMY     TOTAL KNEE ARTHROPLASTY Right 12/23/2019   Procedure: TOTAL KNEE ARTHROPLASTY;  Surgeon: Vickey Huger, MD;  [facility];  Service: Orthopedics;  Laterality: Right;   TRIGGER FINGER RELEASE Left 05/21/2021   Procedure: RELEASE TRIGGER FINGER/A-1 PULLEY LEFT MIDDLE FINGER;  Surgeon: Daryll Brod, MD;  Location: [place];  Service: Orthopedics;  Laterality: Left;    There were no vitals filed for this visit.   Subjective Assessment - 06/03/21 1611     Subjective Patient reports that she is very tired today, reports that she is doing clothes and has been up and down the stairs    Currently in Pain? Yes    Pain Score 4     Pain Location Back   left knee   Aggravating Factors  up and down the stairs                               [facility] Adult PT Treatment/Exercise - 06/03/21 0001       Knee/Hip Exercises: Stretches   Passive Hamstring Stretch Left;3 reps;20 seconds    Knee: Self-Stretch to increase Flexion Left;4 reps;10 seconds    ITB Stretch Left;3 reps;10 seconds    Piriformis Stretch Left;3 reps;10 seconds    Gastroc Stretch Both;3 reps;20 seconds      Knee/Hip Exercises: Aerobic   Recumbent Bike level 3 x 5 minutes    Nustep level 4 x 5 minutes      Knee/Hip Exercises: Machines for Strengthening   Cybex Knee Extension 10# 2x10    Cybex Knee Flexion 35lb 2x10    Other Machine seated row 20# lats 20# 3x10, 25# atraight arm pulls for core      Manual Therapy   Manual Therapy Soft tissue mobilization    Soft tissue mobilization left knee and patella                       PT Short Term Goals - 04/15/21 1638       PT SHORT TERM GOAL #1   Title independent with initial HEP    Status  Achieved               PT Long Term Goals - 06/03/21 1656       PT LONG TERM GOAL #1   Title decrease pain 50%    Status Partially Met      PT LONG TERM GOAL #2   Title increase lumbar ROM 25% with less pain    Status Achieved      PT LONG TERM GOAL #3   Title increase LE strength to 4+/5    Status Partially Met  PT LONG TERM GOAL #4   Title go up and down stairs step over step    Status Partially Met                   Plan - 06/03/21 1654     Clinical Impression Statement Patient very fatigued today and has a little more pain, she reports that prior to her hand surgery she is trying to get things done and went up and down the stairs too many times today, has left knee pain and some back pain today, we did some stretching    PT Next Visit Plan patient will be out the next week or so due to her having hand surgery    Consulted and Agree with Plan of Care Patient             Patient will benefit from skilled therapeutic intervention in order to improve the following deficits and impairments:  Abnormal gait, Decreased range of motion, Decreased balance, Decreased scar mobility, Impaired flexibility, Decreased strength, Decreased mobility, Decreased endurance, Pain, Cardiopulmonary status limiting activity, Decreased activity tolerance, Postural dysfunction, Improper body mechanics, Increased muscle spasms, Difficulty walking  Visit Diagnosis: Acute bilateral low back pain without sciatica  Muscle weakness (generalized)  Difficulty in walking, not elsewhere classified  Muscle spasm of back  Pain in thoracic spine     Problem List Patient Active Problem List   Diagnosis Date Noted   Myelopathy concurrent with and due to spinal stenosis of thoracic region ([place]) 10/27/2020   S/P total knee arthroplasty, right 12/23/2019   Acalculous cholecystitis 08/24/2018   Osteoarthritis of right knee    Vertigo    DDD (degenerative disc disease), cervical     Benign essential HTN    History of DVT (deep vein thrombosis)    Tachycardia    Steroid-induced hyperglycemia    Hypothyroidism    Neuropathic pain    Acute blood loss anemia    S/P lumbar fusion    Closed L5 vertebral fracture ([place]) 08/31/2017   Closed fracture of fifth lumbar vertebra ([place]) 08/30/2017   Lumbar vertebral fracture ([place]) 07/17/2017   Scoliosis 06/13/2017    Sumner Boast, PT 06/03/2021, 4:57 PM  [address]. [place], [place], [address] Phone: [phone]   Fax:  [phone]  Name: [name] MRN: [id number] Date of Birth: 26-Sep-1932

## 2021-06-07 NOTE — Progress Notes (Signed)

## 2021-06-08 ENCOUNTER — Ambulatory Visit (HOSPITAL_BASED_OUTPATIENT_CLINIC_OR_DEPARTMENT_OTHER)
Admission: RE | Admit: 2021-06-08 | Discharge: 2021-06-08 | Disposition: A | Discharge status: Home / Self Care | Payer: Medicare Other | Source: Ambulatory Visit | Attending: Orthopedic Surgery | Admitting: Orthopedic Surgery

## 2021-06-08 ENCOUNTER — Ambulatory Visit (HOSPITAL_BASED_OUTPATIENT_CLINIC_OR_DEPARTMENT_OTHER): Payer: Medicare Other | Admitting: Anesthesiology

## 2021-06-08 ENCOUNTER — Encounter (HOSPITAL_BASED_OUTPATIENT_CLINIC_OR_DEPARTMENT_OTHER)
Admission: RE | Disposition: A | Discharge status: Home / Self Care | Payer: Self-pay | Source: Ambulatory Visit | Attending: Orthopedic Surgery

## 2021-06-08 ENCOUNTER — Encounter (HOSPITAL_BASED_OUTPATIENT_CLINIC_OR_DEPARTMENT_OTHER): Payer: Self-pay | Admitting: Orthopedic Surgery

## 2021-06-08 ENCOUNTER — Ambulatory Visit: Payer: Medicare Other | Admitting: Physical Therapy

## 2021-06-08 ENCOUNTER — Other Ambulatory Visit: Payer: Self-pay

## 2021-06-08 DIAGNOSIS — R2232 Localized swelling, mass and lump, left upper limb: Secondary | ICD-10-CM | POA: Diagnosis not present

## 2021-06-08 DIAGNOSIS — M65332 Trigger finger, left middle finger: Secondary | ICD-10-CM | POA: Diagnosis not present

## 2021-06-08 DIAGNOSIS — M67432 Ganglion, left wrist: Secondary | ICD-10-CM | POA: Insufficient documentation

## 2021-06-08 DIAGNOSIS — I10 Essential (primary) hypertension: Secondary | ICD-10-CM | POA: Diagnosis not present

## 2021-06-08 DIAGNOSIS — E039 Hypothyroidism, unspecified: Secondary | ICD-10-CM | POA: Diagnosis not present

## 2021-06-08 DIAGNOSIS — Z86718 Personal history of other venous thrombosis and embolism: Secondary | ICD-10-CM | POA: Diagnosis not present

## 2021-06-08 DIAGNOSIS — K219 Gastro-esophageal reflux disease without esophagitis: Secondary | ICD-10-CM | POA: Diagnosis not present

## 2021-06-08 DIAGNOSIS — Z87891 Personal history of nicotine dependence: Secondary | ICD-10-CM | POA: Insufficient documentation

## 2021-06-08 DIAGNOSIS — M659 Synovitis and tenosynovitis, unspecified: Secondary | ICD-10-CM | POA: Diagnosis not present

## 2021-06-08 HISTORY — PX: TRIGGER FINGER RELEASE: SHX641

## 2021-06-08 HISTORY — PX: TENOLYSIS: SHX396

## 2021-06-08 SURGERY — RELEASE, A1 PULLEY, FOR TRIGGER FINGER
Anesthesia: Regional | Site: Wrist | Laterality: Left

## 2021-06-08 MED ORDER — PROPOFOL 500 MG/50ML IV EMUL
INTRAVENOUS | Status: AC
  Filled 2021-06-08: qty 50

## 2021-06-08 MED ORDER — PHENYLEPHRINE 40 MCG/ML (10ML) SYRINGE FOR IV PUSH (FOR BLOOD PRESSURE SUPPORT)
PREFILLED_SYRINGE | INTRAVENOUS | Status: AC
  Filled 2021-06-08: qty 10

## 2021-06-08 MED ORDER — ACETAMINOPHEN 500 MG PO TABS
1000.0000 mg | ORAL_TABLET | Freq: Once | ORAL | Status: AC
  Administered 2021-06-08: 1000 mg via ORAL

## 2021-06-08 MED ORDER — CLINDAMYCIN PHOSPHATE 900 MG/50ML IV SOLN
900.0000 mg | INTRAVENOUS | Status: AC
  Administered 2021-06-08: 900 mg via INTRAVENOUS

## 2021-06-08 MED ORDER — LIDOCAINE 2% (20 MG/ML) 5 ML SYRINGE
INTRAMUSCULAR | Status: AC
  Filled 2021-06-08: qty 5

## 2021-06-08 MED ORDER — ATROPINE SULFATE 0.4 MG/ML IV SOLN
INTRAVENOUS | Status: AC
  Filled 2021-06-08: qty 1

## 2021-06-08 MED ORDER — LACTATED RINGERS IV SOLN: INTRAVENOUS | Status: DC

## 2021-06-08 MED ORDER — ACETAMINOPHEN 500 MG PO TABS
ORAL_TABLET | ORAL | Status: AC
  Filled 2021-06-08: qty 2

## 2021-06-08 MED ORDER — BUPIVACAINE HCL (PF) 0.25 % IJ SOLN
INTRAMUSCULAR | Status: DC | PRN
  Administered 2021-06-08: 8.5 mL

## 2021-06-08 MED ORDER — LIDOCAINE HCL (PF) 0.5 % IJ SOLN
INTRAMUSCULAR | Status: DC | PRN
  Administered 2021-06-08: 40 mL via INTRAVENOUS

## 2021-06-08 MED ORDER — EPHEDRINE 5 MG/ML INJ
INTRAVENOUS | Status: AC
  Filled 2021-06-08: qty 5

## 2021-06-08 MED ORDER — 0.9 % SODIUM CHLORIDE (POUR BTL) OPTIME
TOPICAL | Status: DC | PRN
  Administered 2021-06-08: 120 mL

## 2021-06-08 MED ORDER — SUCCINYLCHOLINE CHLORIDE 200 MG/10ML IV SOSY
PREFILLED_SYRINGE | INTRAVENOUS | Status: AC
  Filled 2021-06-08: qty 10

## 2021-06-08 MED ORDER — OXYCODONE HCL 5 MG/5ML PO SOLN: 5.0000 mg | Freq: Once | ORAL | Status: DC | PRN

## 2021-06-08 MED ORDER — MIDAZOLAM HCL 2 MG/2ML IJ SOLN
INTRAMUSCULAR | Status: AC
  Filled 2021-06-08: qty 2

## 2021-06-08 MED ORDER — FENTANYL CITRATE (PF) 100 MCG/2ML IJ SOLN
INTRAMUSCULAR | Status: AC
  Filled 2021-06-08: qty 2

## 2021-06-08 MED ORDER — FENTANYL CITRATE (PF) 100 MCG/2ML IJ SOLN: 25.0000 ug | INTRAMUSCULAR | Status: DC | PRN

## 2021-06-08 MED ORDER — FENTANYL CITRATE (PF) 100 MCG/2ML IJ SOLN
INTRAMUSCULAR | Status: DC | PRN
  Administered 2021-06-08: 50 ug via INTRAVENOUS

## 2021-06-08 MED ORDER — OXYCODONE HCL 5 MG PO TABS: 5.0000 mg | ORAL_TABLET | Freq: Once | ORAL | Status: DC | PRN

## 2021-06-08 MED ORDER — ONDANSETRON HCL 4 MG/2ML IJ SOLN
INTRAMUSCULAR | Status: AC
  Filled 2021-06-08: qty 2

## 2021-06-08 MED ORDER — CLINDAMYCIN PHOSPHATE 900 MG/50ML IV SOLN
INTRAVENOUS | Status: AC
  Filled 2021-06-08: qty 50

## 2021-06-08 MED ORDER — PROPOFOL 500 MG/50ML IV EMUL
INTRAVENOUS | Status: DC | PRN
  Administered 2021-06-08: 55 ug/kg/min via INTRAVENOUS

## 2021-06-08 MED ORDER — TRAMADOL HCL 50 MG PO TABS: 50.0000 mg | ORAL_TABLET | Freq: Four times a day (QID) | ORAL | 0 refills | Status: DC | PRN

## 2021-06-08 MED ORDER — ONDANSETRON HCL 4 MG/2ML IJ SOLN
INTRAMUSCULAR | Status: DC | PRN
Start: 2021-06-08 — End: 2021-06-08
  Administered 2021-06-08: 4 mg via INTRAVENOUS

## 2021-06-08 MED ORDER — ONDANSETRON HCL 4 MG/2ML IJ SOLN: 4.0000 mg | Freq: Once | INTRAMUSCULAR | Status: DC | PRN

## 2021-06-08 SURGICAL SUPPLY — 43 items
APL PRP STRL LF DISP 70% ISPRP (MISCELLANEOUS) ×2
BLADE SURG 15 STRL LF DISP TIS (BLADE) ×2 IMPLANT
BLADE SURG 15 STRL SS (BLADE) ×4
BNDG CMPR 5X2 CHSV 1 LYR STRL (GAUZE/BANDAGES/DRESSINGS) ×2
BNDG CMPR 5X3 CHSV STRCH STRL (GAUZE/BANDAGES/DRESSINGS) ×2
BNDG CMPR 9X4 STRL LF SNTH (GAUZE/BANDAGES/DRESSINGS) ×2
BNDG COHESIVE 2X5 TAN ST LF (GAUZE/BANDAGES/DRESSINGS) ×4 IMPLANT
BNDG COHESIVE 3X5 TAN ST LF (GAUZE/BANDAGES/DRESSINGS) ×4 IMPLANT
BNDG ESMARK 4X9 LF (GAUZE/BANDAGES/DRESSINGS) ×2 IMPLANT
BNDG GAUZE ELAST 4 BULKY (GAUZE/BANDAGES/DRESSINGS) ×4 IMPLANT
CHLORAPREP W/TINT 26 (MISCELLANEOUS) ×4 IMPLANT
CORD BIPOLAR FORCEPS 12FT (ELECTRODE) ×4 IMPLANT
COVER BACK TABLE 60X90IN (DRAPES) ×4 IMPLANT
COVER MAYO STAND STRL (DRAPES) ×4 IMPLANT
DRAPE EXTREMITY T 121X128X90 (DISPOSABLE) ×4 IMPLANT
DRAPE SURG 17X23 STRL (DRAPES) ×4 IMPLANT
GAUZE 4X4 16PLY ~~LOC~~+RFID DBL (SPONGE) ×2 IMPLANT
GAUZE SPONGE 4X4 12PLY STRL (GAUZE/BANDAGES/DRESSINGS) ×4 IMPLANT
GAUZE XEROFORM 1X8 LF (GAUZE/BANDAGES/DRESSINGS) ×4 IMPLANT
GLOVE SURG ORTHO LTX SZ8 (GLOVE) ×4 IMPLANT
GLOVE SURG UNDER POLY LF SZ8.5 (GLOVE) ×4 IMPLANT
GOWN STRL REUS W/ TWL LRG LVL3 (GOWN DISPOSABLE) ×2 IMPLANT
GOWN STRL REUS W/TWL LRG LVL3 (GOWN DISPOSABLE) ×4
GOWN STRL REUS W/TWL XL LVL3 (GOWN DISPOSABLE) ×4 IMPLANT
NDL HYPO 27GX1-1/4 (NEEDLE) ×2 IMPLANT
NEEDLE HYPO 22GX1.5 SAFETY (NEEDLE) ×2 IMPLANT
NEEDLE HYPO 27GX1-1/4 (NEEDLE) ×4 IMPLANT
NS IRRIG 1000ML POUR BTL (IV SOLUTION) ×4 IMPLANT
PACK BASIN DAY SURGERY FS (CUSTOM PROCEDURE TRAY) ×4 IMPLANT
PAD CAST 3X4 CTTN HI CHSV (CAST SUPPLIES) ×2 IMPLANT
PADDING CAST ABS 4INX4YD NS (CAST SUPPLIES) ×2
PADDING CAST ABS COTTON 4X4 ST (CAST SUPPLIES) ×2 IMPLANT
PADDING CAST COTTON 3X4 STRL (CAST SUPPLIES) ×4
SLEEVE SCD COMPRESS KNEE MED (STOCKING) ×2 IMPLANT
SPLINT FAST PLASTER 5X30 (CAST SUPPLIES) ×10
SPLINT PLASTER CAST FAST 5X30 (CAST SUPPLIES) IMPLANT
STOCKINETTE 4X48 STRL (DRAPES) ×4 IMPLANT
SUT ETHILON 4 0 PS 2 18 (SUTURE) ×4 IMPLANT
SUT VICRYL 4-0 PS2 18IN ABS (SUTURE) ×2 IMPLANT
SYR BULB EAR ULCER 3OZ GRN STR (SYRINGE) ×4 IMPLANT
SYR CONTROL 10ML LL (SYRINGE) ×4 IMPLANT
TOWEL GREEN STERILE FF (TOWEL DISPOSABLE) ×8 IMPLANT
UNDERPAD 30X36 HEAVY ABSORB (UNDERPADS AND DIAPERS) ×4 IMPLANT

## 2021-06-08 NOTE — H&P (Signed)
?[name] is an 88 y.o. female.   ?Chief Complaint: catching left middle and mass dorsal wrist  ?HPI: [name] is an 88 yo female with triggering left middle finger and tumor dorsal aspect left hand. She was referred for ultrasound at that time. We had discussed the possibility of release of the A1 pulley and she elected to have examination of the mass to see if it could removed be removed at the same time. Dr. Nelia Shi has done the ultrasound revealing a tenosynovitis of the extensor carpi radialis brevis extensor pollicis longus tendons at the crossing along with a stenosing tenosynovitis of the middle finger. Complains of moderate discomfort on the dorsal aspect and continued triggering of her middle finger left hand ? ?Past Medical History:  ?Diagnosis Date  ? Acalculous cholecystitis 08/24/2018  ? Arthritis   ? DVT of lower extremity (deep venous thrombosis) ([place])   ? right leg - after back surgery in 2019  ? GERD (gastroesophageal reflux disease)   ? History of blood transfusion   ? Hypothyroidism   ? PONV (postoperative nausea and vomiting)   ? Ptosis of both eyelids   ? Vertigo   ? Wears glasses   ? reading  ? ? ?Past Surgical History:  ?Procedure Laterality Date  ? ABDOMINAL HYSTERECTOMY  1986  ? APPLICATION OF ROBOTIC ASSISTANCE FOR SPINAL PROCEDURE N/A 07/17/2017  ? Procedure: APPLICATION OF ROBOTIC ASSISTANCE FOR SPINAL PROCEDURE;  Surgeon: Kristeen Miss, MD;  Location: [place];  Service: Neurosurgery;  Laterality: N/A;  ? APPLICATION OF ROBOTIC ASSISTANCE FOR SPINAL PROCEDURE N/A 09/01/2017  ? Procedure: APPLICATION OF ROBOTIC ASSISTANCE FOR SPINAL PROCEDURE;  Surgeon: Kristeen Miss, MD;  Location: [place];  Service: Neurosurgery;  Laterality: N/A;  ? APPLICATION OF ROBOTIC ASSISTANCE FOR SPINAL PROCEDURE N/A 10/27/2020  ? Procedure: APPLICATION OF ROBOTIC ASSISTANCE FOR SPINAL PROCEDURE;  Surgeon: Kristeen Miss, MD;  Location: [place];  Service: Neurosurgery;  Laterality: N/A;  ? [place]  ?  lumb lam  ? CARPAL TUNNEL RELEASE  2000  ? right  ? CARPAL TUNNEL RELEASE  2012  ? left  ? CHOLECYSTECTOMY N/A 08/24/2018  ? Procedure: LAPAROSCOPIC CHOLECYSTECTOMY WITH INTRAOPERATIVE CHOLANGIOGRAM;  Surgeon: Fanny Skates, MD;  Location: [place];  Service: General;  Laterality: N/A;  ? COLONOSCOPY    ? CYSTOCELE REPAIR  2007  ? sling  ? DISTAL INTERPHALANGEAL JOINT FUSION Right 02/04/2014  ? Procedure: FUSION DISTAL INTERPHALANGEAL JOINT RIGHT INDEX FINGER ;  Surgeon: Daryll Brod, MD;  Location: [place];  Service: Orthopedics;  Laterality: Right;  ? EYE SURGERY Bilateral   ? Cataract surgery with lens implant  ? KNEE SURGERY  2009,2011  ? partial knee  ? LAPAROSCOPY N/A 08/24/2018  ? Procedure: LAPAROSCOPY DIAGNOSTIC;  Surgeon: Fanny Skates, MD;  Location: [place];  Service: General;  Laterality: N/A;  ? LUMBAR PERCUTANEOUS PEDICLE SCREW 4 LEVEL N/A 10/27/2020  ? Procedure: Thoracic nine-ten Laminectomy with extension of fusion from Thoracic ten to Thoracic six with segmental fixation (cemented) with pedicle screw and mazor;  Surgeon: Kristeen Miss, MD;  Location: [place];  Service: Neurosurgery;  Laterality: N/A;  ? OPEN REDUCTION INTERNAL FIXATION (ORIF) PROXIMAL PHALANX Left 08/30/2013  ? Procedure: OPEN REDUCTION INTERNAL FIXATION (ORIF) PROXIMAL PHALANX FRACTURE LEFT SMALL FINGER; SPLINT RING FINGER;  Surgeon: Wynonia Sours, MD;  Location: [place];  Service: Orthopedics;  Laterality: Left;  ? POSTERIOR LUMBAR FUSION 4 LEVEL N/A 07/17/2017  ? Procedure: Thoracic Ten -  Lumbar Five revison of hardware with Mazor;  Surgeon: Kristeen Miss, MD;  Location: [place];  Service: Neurosurgery;  Laterality: N/A;  Thoracic/Lumbar  ? PTOSIS REPAIR Bilateral 07/27/2015  ? Procedure: PTOSIS REPAIR;  Surgeon: Cristine Polio, MD;  Location: [place];  Service: Plastics;  Laterality: Bilateral;  ? SHOULDER SURGERY    ? rt rcr,and lt  ? TENOLYSIS Left 05/21/2021  ? Procedure: RELEASE  EXTENSOR POLLICIS LONGUS LEFT;  Surgeon: Daryll Brod, MD;  Location: [place];  Service: Orthopedics;  Laterality: Left;  ? TONSILLECTOMY    ? TOTAL KNEE ARTHROPLASTY Right 12/23/2019  ? Procedure: TOTAL KNEE ARTHROPLASTY;  Surgeon: Vickey Huger, MD;  Location: [facility];  Service: Orthopedics;  Laterality: Right;  ? TRIGGER FINGER RELEASE Left 05/21/2021  ? Procedure: RELEASE TRIGGER FINGER/A-1 PULLEY LEFT MIDDLE FINGER;  Surgeon: Daryll Brod, MD;  Location: [place];  Service: Orthopedics;  Laterality: Left;  ? ? ?Family History  ?Problem Relation Age of Onset  ? Diabetes Sister   ? Breast cancer Neg Hx   ? ?Social History:  reports that she has quit smoking. She has never used smokeless tobacco. She reports that she does not drink alcohol and does not use drugs. ? ?Allergies:  ?Allergies  ?Allergen Reactions  ? Keflex [Cephalexin] Hives  ? ? ?No medications prior to admission.  ? ? ?No results found for this or any previous visit (from the past 48 hour(s)). ? ?No results found. ? ? ?Pertinent items are noted in HPI. ? ?Height '5\' 6"'$  (1.676 m), weight 77.1 kg. ? ?General appearance: cooperative and appears stated age ?Head: Normocephalic, without obvious abnormality, asymmetric shape, atraumatic ?Neck: no JVD ?Resp: clear to auscultation bilaterally ?Cardio: regular rate and rhythm, S1, S2 normal, no murmur, click, rub or gallop ?GI: soft, non-tender; bowel sounds normal; no masses,  no organomegaly ?Extremities: catching left middle and mass dorsal wrist  ?Pulses: 2+ and symmetric ?Skin: Skin color, texture, turgor normal. No rashes or lesions ?Neurologic: Grossly normal ?Incision/Wound: ?na ? ?Assessment/Plan ?Diagnosis stenosing tenosynovitis left middle finger with tendinitis EPL left wrist ?Plan: She would like to proceed to have the trigger finger release would recommend is a release of the EPL tendon at the same time. This may require translocation of the tendon from Lister's  tubercle. Prepare postoperative course are discussed along with risk and complications. She is aware there is no guarantee to the surgery possibility of infection recurrence injury to arteries nerves tendons incomplete relief symptoms and dystrophy. She is scheduled for release A1 pulley and EPL tendon left hand and wrist as an outpatient under regional anesthesia.  ?Daryll Brod ?06/08/2021, 9:56 AM ? ?  ?

## 2021-06-08 NOTE — Brief Op Note (Signed)
06/08/2021 ? ?12:37 PM ? ?PATIENT:  [name]  88 y.o. female ? ?PRE-OPERATIVE DIAGNOSIS:  TRIGGER LEFT MIDDLE FINGER, EXTENSOR POLLICIS LONGUS TENDONITIS LEFT ? ?POST-OPERATIVE DIAGNOSIS:  TRIGGER LEFT MIDDLE FINGER, EXTENSOR POLLICIS LONGUS TENDONITIS LEFT ? ?PROCEDURE:  Procedure(s): ?RELEASE A-1 PULLEY LEFT MIDDLE FINGER (Left) ?RELEASE EXTENSOR POLLICIS LONGUS LEFT (Left) ? ?SURGEON:  Surgeon(s) and Role: ?   Daryll Brod, MD - Primary ? ?PHYSICIAN ASSISTANT:  ? ?ASSISTANTS: none  ? ?ANESTHESIA:   local, regional, and IV sedation ? ?EBL:  10 mL  ? ?BLOOD ADMINISTERED:none ? ?DRAINS: none  ? ?LOCAL MEDICATIONS USED:  BUPIVICAINE  ? ?SPECIMEN:  Excision ? ?DISPOSITION OF SPECIMEN:  PATHOLOGY ? ?COUNTS:  YES ? ?TOURNIQUET:   ?Total Tourniquet Time Documented: ?Upper Arm (Left) - 48 minutes ?Total: Upper Arm (Left) - 48 minutes ? ? ?DICTATION: .Dragon Dictation ? ?PLAN OF CARE: Discharge to home after PACU ? ?PATIENT DISPOSITION:  PACU - hemodynamically stable. ?  ? ? ?

## 2021-06-08 NOTE — Anesthesia Preprocedure Evaluation (Addendum)
Anesthesia Evaluation  ?Patient identified by MRN, date of birth, ID band ?Patient awake ? ? ? ?Reviewed: ?Allergy & Precautions, NPO status , Patient's Chart, lab work & pertinent test results ? ?History of Anesthesia Complications ?(+) PONV and history of anesthetic complications ? ?Airway ?Mallampati: II ? ?TM Distance: >3 FB ?Neck ROM: Full ? ? ? Dental ? ?(+) Partial Upper, Partial Lower ?  ?Pulmonary ?former smoker,  ?  ?Pulmonary exam normal ? ? ? ? ? ? ? Cardiovascular ?hypertension (no meds), + DVT  ?Normal cardiovascular exam ? ? ?  ?Neuro/Psych ? ?Vertigo ?B/l ptosis ? ?negative psych ROS  ? GI/Hepatic ?Neg liver ROS, GERD  Medicated and Controlled,  ?Endo/Other  ?Hypothyroidism  ? Renal/GU ?negative Renal ROS  ? ?  ?Musculoskeletal ? ?(+) Arthritis ,  ? Abdominal ?  ?Peds ? Hematology ?negative hematology ROS ?(+)   ?Anesthesia Other Findings ? ? ? Reproductive/Obstetrics ? ?  ? ? ? ? ? ? ? ? ? ? ? ? ? ?  ?  ? ? ? ? ? ? ? ?Anesthesia Physical ?Anesthesia Plan ? ?ASA: 3 ? ?Anesthesia Plan: Consulting civil engineer Block-LIDOCAINE ONLY  ? ?Post-op Pain Management: Regional block* and Tylenol PO (pre-op)*  ? ?Induction:  ? ?PONV Risk Score and Plan: 3 and Propofol infusion and Treatment may vary due to age or medical condition ? ?Airway Management Planned: Natural Airway and Simple Face Mask ? ?Additional Equipment: None ? ?Intra-op Plan:  ? ?Post-operative Plan:  ? ?Informed Consent: I have reviewed the patients History and Physical, chart, labs and discussed the procedure including the risks, benefits and alternatives for the proposed anesthesia with the patient or authorized representative who has indicated his/her understanding and acceptance.  ? ? ? ? ? ?Plan Discussed with: CRNA and Anesthesiologist ? ?Anesthesia Plan Comments:   ? ? ? ? ? ?Anesthesia Quick Evaluation ? ?

## 2021-06-08 NOTE — Discharge Instructions (Addendum)
[facility] Instructions ?Hand Surgery ? ?Wound Care: ?Keep your hand elevated above the level of your heart.  Do not allow it to dangle by your side.  Keep the dressing dry and do not remove it unless your doctor advises you to do so.  He will usually change it at the time of your post-op visit.  Moving your fingers is advised to stimulate circulation but will depend on the site of your surgery.  If you have a splint applied, your doctor will advise you regarding movement. ? ?Activity: ?Do not drive or operate machinery today.  Rest today and then you may return to your normal activity and work as indicated by your physician. ? ?Diet:  ?Drink liquids today or eat a light diet.  You may resume a regular diet tomorrow.   ? ?General expectations: ?Pain for two to three days. ?Fingers may become slightly swollen. ? ?Call your doctor if any of the following occur: ?Severe pain not relieved by pain medication. ?Elevated temperature. ?Dressing soaked with blood. ?Inability to move fingers. ?White or bluish color to fingers.  ? ?No Tylenol until 4:57 pm ? ? ?Post Anesthesia Home Care Instructions ? ?Activity: ?Get plenty of rest for the remainder of the day. A responsible individual must stay with you for 24 hours following the procedure.  ?For the next 24 hours, DO NOT: ?-Drive a car ?-Paediatric nurse ?-Drink alcoholic beverages ?-Take any medication unless instructed by your physician ?-Make any legal decisions or sign important papers. ? ?Meals: ?Start with liquid foods such as gelatin or soup. Progress to regular foods as tolerated. Avoid greasy, spicy, heavy foods. If nausea and/or vomiting occur, drink only clear liquids until the nausea and/or vomiting subsides. Call your physician if vomiting continues. ? ?Special Instructions/Symptoms: ?Your throat may feel dry or sore from the anesthesia or the breathing tube placed in your throat during surgery. If this causes discomfort, gargle with warm salt water. The  discomfort should disappear within 24 hours. ? ?If you had a scopolamine patch placed behind your ear for the management of post- operative nausea and/or vomiting: ? ?1. The medication in the patch is effective for 72 hours, after which it should be removed.  Wrap patch in a tissue and discard in the trash. Wash hands thoroughly with soap and water. ?2. You may remove the patch earlier than 72 hours if you experience unpleasant side effects which may include dry mouth, dizziness or visual disturbances. ?3. Avoid touching the patch. Wash your hands with soap and water after contact with the patch. ?    ?

## 2021-06-08 NOTE — Op Note (Signed)
NAME: [name] ?MEDICAL RECORD NO: [id number] ?DATE OF BIRTH: [birth year] ?FACILITY:  ?LOCATION: [place] ?PHYSICIAN: Wynonia Sours, MD ?  ?OPERATIVE REPORT ?  ?DATE OF PROCEDURE: 06/08/21  ?  ?PREOPERATIVE DIAGNOSIS: 1 trigger left middle finger with nodule ?2 extensor Tina synovectomy third dorsal compartment  ?POSTOPERATIVE DIAGNOSIS: Same ?  ?PROCEDURE: #1 release trigger finger with excision probable Dupuytren's nodule left middle finger ?#2  Tenosynovectomy EPL with transposition left hand ?  ?SURGEON: Daryll Brod, M.D. ?  ?ASSISTANT: none ?  ?ANESTHESIA:  Bier block with sedation and Local ?  ?INTRAVENOUS FLUIDS:  Per anesthesia flow sheet. ?  ?ESTIMATED BLOOD LOSS:  Minimal. ?  ?COMPLICATIONS:  None. ?  ?SPECIMENS: #1 mass middle finger #2 tenosynovial tissue EPL ?  ?TOURNIQUET TIME:   ? ?Total Tourniquet Time Documented: ?Upper Arm (Left) - 48 minutes ?Total: Upper Arm (Left) - 48 minutes ? ?  ?DISPOSITION:  Stable to PACU. ?  ?INDICATIONS: Patient is an 88 year old female with history of swelling of the dorsal aspect of her left wrist and triggering of the left middle finger with a nodule present.  This has not responded to conservative treatment she is elected undergo surgical release of the A1 pulley with excision of the nodule and release transposition EPL tendon left wrist.  Prepare postoperative course been discussed along with risks and complications.  She is aware there is no guarantee to the surgery the possibility of infection recurrence injury to arteries nerves tendons complete relief symptoms and dystrophy.  Preoperative area the patient is seen extremity marked by both patient and surgeon antibiotic given ? ?OPERATIVE COURSE: Patient brought to the operating room placed in the supine position with the left arm free and upper arm IV regional anesthetic was carried out without difficulty under the direction of the anesthesia department.  She was prepped with  ChloraPrep.  A 3-minute dry time was allowed timeout taken confirm patient procedure.  An oblique incision was made over the A1 pulley of the left middle finger carried down through subcutaneous tissue.  Bleeders were electrocauterized with bipolar.  Neurovascular structures identified protected a nodule was present proximally distally in the palmar fascia with extension about the A2 pulley this was isolated and removed in toto and sent to pathology.  This measured approximately 9 x 5 mm.  The A1 pulley was then released on its radial aspect a small incision made centrally and A2.  No synovial tissue was broken by placing retractors on the flexor tendons and separating these.  The finger was placed through full passive range of motion and no further triggering was noted the wound was copious irrigated with saline and closed interrupted 4 nylon sutures ? ?The dorsal wrist was then attended to next a curvilinear incision was made over the mass in line with the EPL tendon carried down through subcutaneous tissue.  Neurovascular structures were identified protected.  The dissection was carried proximally to the Lister's tubercle and the EPL tendon was found to be markedly encased in a tenosynovitis of significant proportions.  This was isolated.  The tenosynovitis no real tissue was removed blunt sharp dissection including use of rondure.  It was sent to pathology and fluid was present within it.  The EPL tendon was then released from the retinaculum proximally.  Wound was copiously irrigated with saline.  The retinaculum was then repaired maintaining the EPL tendon in a transposed position dorsally.  The wound was copiously irrigated with saline.  The subcutaneous  tissue was closed interrupted 4-0 Vicryl and the skin with interrupted 4 nylon sutures.  Local infiltration quarter percent bupivacaine without epinephrine was given to the 2 incisions approximately 8-1/2 cc was used.  A sterile compressive dressing thumb  spica splint was applied.  Deflation of the tourniquet all fingers immediately pink.  She was taken to the recovery room for observation in satisfactory condition.  She will be discharged home to return to the hand center [facility] in 1 week on Tylenol ibuprofen for pain she has Ultram for breakthrough. ? ? ?Daryll Brod, MD ?Electronically signed, 06/08/21 ? ?

## 2021-06-08 NOTE — Transfer of Care (Signed)
Immediate Anesthesia Transfer of Care Note ? ?Patient: [name] ? ?Procedure(s) Performed: RELEASE A-1 PULLEY LEFT MIDDLE FINGER (Left: Hand) ?RELEASE EXTENSOR POLLICIS LONGUS LEFT (Left: Wrist) ? ?Patient Location: PACU ? ?Anesthesia Type:MAC and Bier block ? ?Level of Consciousness: awake, alert  and oriented ? ?Airway & Oxygen Therapy: Patient Spontanous Breathing and Patient connected to face mask oxygen ? ?Post-op Assessment: Report given to RN and Post -op Vital signs reviewed and stable ? ?Post vital signs: Reviewed and stable ? ?Last Vitals:  ?Vitals Value Taken Time  ?BP    ?Temp    ?Pulse 53 06/08/21 1242  ?Resp    ?SpO2 96 % 06/08/21 1242  ?Vitals shown include unvalidated device data. ? ?Last Pain:  ?Vitals:  ? 06/08/21 1055  ?TempSrc: Oral  ?PainSc: 0-No pain  ?   ? ?  ? ?Complications: No notable events documented. ?

## 2021-06-08 NOTE — Anesthesia Postprocedure Evaluation (Signed)
Anesthesia Post Note ? ?Patient: [name] ? ?Procedure(s) Performed: RELEASE A-1 PULLEY LEFT MIDDLE FINGER (Left: Hand) ?RELEASE EXTENSOR POLLICIS LONGUS LEFT (Left: Wrist) ? ?  ? ?Patient location during evaluation: PACU ?Anesthesia Type: Bier Block ?Level of consciousness: awake and alert ?Pain management: pain level controlled ?Vital Signs Assessment: post-procedure vital signs reviewed and stable ?Respiratory status: spontaneous breathing, nonlabored ventilation and respiratory function stable ?Cardiovascular status: stable and blood pressure returned to baseline ?Anesthetic complications: no ? ? ?No notable events documented. ? ?Last Vitals:  ?Vitals:  ? 06/08/21 1315 06/08/21 1347  ?BP: (!) 148/67 (!) 156/72  ?Pulse: (!) 53 (!) 59  ?Resp: 16 14  ?Temp:  (!) 36.3 ?C  ?SpO2: 96% 98%  ?  ?Last Pain:  ?Vitals:  ? 06/08/21 1347  ?TempSrc:   ?PainSc: 3   ? ? ?  ?  ?  ?  ?  ?  ? ?Audry Pili ? ? ? ? ?

## 2021-06-09 ENCOUNTER — Encounter (HOSPITAL_BASED_OUTPATIENT_CLINIC_OR_DEPARTMENT_OTHER): Payer: Self-pay | Admitting: Orthopedic Surgery

## 2021-06-09 LAB — SURGICAL PATHOLOGY

## 2021-06-09 NOTE — Progress Notes (Signed)
Left message stating courtesy call and if any questions or concerns please call the doctors office.  

## 2021-06-10 ENCOUNTER — Ambulatory Visit: Payer: Medicare Other | Admitting: Physical Therapy

## 2021-06-17 ENCOUNTER — Ambulatory Visit: Payer: Medicare Other | Admitting: Physical Therapy

## 2021-06-22 ENCOUNTER — Encounter: Payer: Self-pay | Admitting: Physical Therapy

## 2021-06-22 ENCOUNTER — Ambulatory Visit: Payer: Medicare Other | Admitting: Physical Therapy

## 2021-06-22 ENCOUNTER — Other Ambulatory Visit: Payer: Self-pay

## 2021-06-22 DIAGNOSIS — R262 Difficulty in walking, not elsewhere classified: Secondary | ICD-10-CM

## 2021-06-22 DIAGNOSIS — M546 Pain in thoracic spine: Secondary | ICD-10-CM

## 2021-06-22 DIAGNOSIS — M545 Low back pain, unspecified: Secondary | ICD-10-CM | POA: Diagnosis not present

## 2021-06-22 DIAGNOSIS — M62830 Muscle spasm of back: Secondary | ICD-10-CM

## 2021-06-22 DIAGNOSIS — M6281 Muscle weakness (generalized): Secondary | ICD-10-CM

## 2021-06-22 NOTE — Therapy (Signed)
[place] [address] ?[place], [place], [address] ?Phone: [phone]   Fax:  [phone] ? ?Physical Therapy Treatment ? ?Patient Details  ?Name: [name] ?MRN: [id number] ?Date of Birth: [birth year] ?Referring Provider (PT): Elsner ? ? ?Encounter Date: 06/22/2021 ? ? PT End of Session - 06/22/21 1655   ? ? Visit Number 31   ? Date for PT Re-Evaluation 07/23/21   ? Authorization Type [employer] medicare   ? PT Start Time 7001   ? PT Stop Time 7494   ? PT Time Calculation (min) 45 min   ? Activity Tolerance Patient tolerated treatment well   ? Behavior During Therapy [facility] for tasks assessed/performed   ? ?  ?  ? ?  ? ? ?Past Medical History:  ?Diagnosis Date  ? Acalculous cholecystitis 08/24/2018  ? Arthritis   ? DVT of lower extremity (deep venous thrombosis) ([place])   ? right leg - after back surgery in 2019  ? GERD (gastroesophageal reflux disease)   ? History of blood transfusion   ? Hypothyroidism   ? PONV (postoperative nausea and vomiting)   ? Ptosis of both eyelids   ? Vertigo   ? Wears glasses   ? reading  ? ? ?Past Surgical History:  ?Procedure Laterality Date  ? ABDOMINAL HYSTERECTOMY  1986  ? APPLICATION OF ROBOTIC ASSISTANCE FOR SPINAL PROCEDURE N/A 07/17/2017  ? Procedure: APPLICATION OF ROBOTIC ASSISTANCE FOR SPINAL PROCEDURE;  Surgeon: Kristeen Miss, MD;  Location: [place];  Service: Neurosurgery;  Laterality: N/A;  ? APPLICATION OF ROBOTIC ASSISTANCE FOR SPINAL PROCEDURE N/A 09/01/2017  ? Procedure: APPLICATION OF ROBOTIC ASSISTANCE FOR SPINAL PROCEDURE;  Surgeon: Kristeen Miss, MD;  Location: [place];  Service: Neurosurgery;  Laterality: N/A;  ? APPLICATION OF ROBOTIC ASSISTANCE FOR SPINAL PROCEDURE N/A 10/27/2020  ? Procedure: APPLICATION OF ROBOTIC ASSISTANCE FOR SPINAL PROCEDURE;  Surgeon: Kristeen Miss, MD;  Location: [place];  Service: Neurosurgery;  Laterality: N/A;  ? [place]  ? lumb lam  ? CARPAL TUNNEL RELEASE  2000  ? right  ? CARPAL  TUNNEL RELEASE  2012  ? left  ? CHOLECYSTECTOMY N/A 08/24/2018  ? Procedure: LAPAROSCOPIC CHOLECYSTECTOMY WITH INTRAOPERATIVE CHOLANGIOGRAM;  Surgeon: Fanny Skates, MD;  Location: [place];  Service: General;  Laterality: N/A;  ? COLONOSCOPY    ? CYSTOCELE REPAIR  2007  ? sling  ? DISTAL INTERPHALANGEAL JOINT FUSION Right 02/04/2014  ? Procedure: FUSION DISTAL INTERPHALANGEAL JOINT RIGHT INDEX FINGER ;  Surgeon: Daryll Brod, MD;  Location: [place];  Service: Orthopedics;  Laterality: Right;  ? EYE SURGERY Bilateral   ? Cataract surgery with lens implant  ? KNEE SURGERY  2009,2011  ? partial knee  ? LAPAROSCOPY N/A 08/24/2018  ? Procedure: LAPAROSCOPY DIAGNOSTIC;  Surgeon: Fanny Skates, MD;  Location: [place];  Service: General;  Laterality: N/A;  ? LUMBAR PERCUTANEOUS PEDICLE SCREW 4 LEVEL N/A 10/27/2020  ? Procedure: Thoracic nine-ten Laminectomy with extension of fusion from Thoracic ten to Thoracic six with segmental fixation (cemented) with pedicle screw and mazor;  Surgeon: Kristeen Miss, MD;  Location: [place];  Service: Neurosurgery;  Laterality: N/A;  ? OPEN REDUCTION INTERNAL FIXATION (ORIF) PROXIMAL PHALANX Left 08/30/2013  ? Procedure: OPEN REDUCTION INTERNAL FIXATION (ORIF) PROXIMAL PHALANX FRACTURE LEFT SMALL FINGER; SPLINT RING FINGER;  Surgeon: Wynonia Sours, MD;  Location: [place];  Service: Orthopedics;  Laterality: Left;  ? POSTERIOR LUMBAR FUSION 4 LEVEL N/A  07/17/2017  ? Procedure: Thoracic Ten - Lumbar Five revison of hardware with Mazor;  Surgeon: Kristeen Miss, MD;  Location: [place];  Service: Neurosurgery;  Laterality: N/A;  Thoracic/Lumbar  ? PTOSIS REPAIR Bilateral 07/27/2015  ? Procedure: PTOSIS REPAIR;  Surgeon: Cristine Polio, MD;  Location: [place];  Service: Plastics;  Laterality: Bilateral;  ? SHOULDER SURGERY    ? rt rcr,and lt  ? TENOLYSIS Left 05/21/2021  ? Procedure: RELEASE EXTENSOR POLLICIS LONGUS LEFT;  Surgeon: Daryll Brod, MD;   Location: [place];  Service: Orthopedics;  Laterality: Left;  ? TENOLYSIS Left 06/08/2021  ? Procedure: RELEASE EXTENSOR POLLICIS LONGUS LEFT;  Surgeon: Daryll Brod, MD;  Location: [place];  Service: Orthopedics;  Laterality: Left;  ? TONSILLECTOMY    ? TOTAL KNEE ARTHROPLASTY Right 12/23/2019  ? Procedure: TOTAL KNEE ARTHROPLASTY;  Surgeon: Vickey Huger, MD;  Location: [facility];  Service: Orthopedics;  Laterality: Right;  ? TRIGGER FINGER RELEASE Left 05/21/2021  ? Procedure: RELEASE TRIGGER FINGER/A-1 PULLEY LEFT MIDDLE FINGER;  Surgeon: Daryll Brod, MD;  Location: [place];  Service: Orthopedics;  Laterality: Left;  ? TRIGGER FINGER RELEASE Left 06/08/2021  ? Procedure: RELEASE A-1 PULLEY LEFT MIDDLE FINGER;  Surgeon: Daryll Brod, MD;  Location: [place];  Service: Orthopedics;  Laterality: Left;  ? ? ?There were no vitals filed for this visit. ? ? Subjective Assessment - 06/22/21 1605   ? ? Subjective I had to sit in an uncomfortable chair today, my right leg is not feeling right   ? Currently in Pain? No/denies   ? ?  ?  ? ?  ? ? ? ? ? ? ? ? ? ? ? ? ? ? ? ? ? ? ? ? [place] Adult PT Treatment/Exercise - 06/22/21 0001   ? ?  ? Ambulation/Gait  ? Gait Comments gait with SPC, light CGA due to some off balance, 150' x 2   ?  ? Knee/Hip Exercises: Stretches  ? Passive Hamstring Stretch Left;3 reps;20 seconds   ? Gastroc Stretch Both;3 reps;20 seconds   ?  ? Knee/Hip Exercises: Aerobic  ? Recumbent Bike level 3 x 5 minutes   ? Nustep level 4 x 5 minutes   ?  ? Knee/Hip Exercises: Machines for Strengthening  ? Cybex Knee Extension 5# 2x10   ? Cybex Knee Flexion 25lb 2x10   ?  ? Knee/Hip Exercises: Standing  ? Hip Flexion Both;2 sets;10 reps   ? Hip Flexion Limitations 3#   ? Hip Abduction 10 reps;Both;2 sets   ? Abduction Limitations 3#   ? Hip Extension 10 reps;Both;2 sets   ? Extension Limitations 3#   ?  ? Knee/Hip Exercises: Seated  ? Other Seated Knee/Hip  Exercises seated resisted hip IR/ER   ? Other Seated Knee/Hip Exercises right ankle red tband exercises -   ?  ? Knee/Hip Exercises: Supine  ? Other Supine Knee/Hip Exercises tried some bridges this caused cramping in her legs   ? ?  ?  ? ?  ? ? ? ? ? ? ? ? ? ? ? ? PT Short Term Goals - 04/15/21 1638   ? ?  ? PT SHORT TERM GOAL #1  ? Title independent with initial HEP   ? Status Achieved   ? ?  ?  ? ?  ? ? ? ? PT Long Term Goals - 06/22/21 1701   ? ?  ? PT LONG TERM  GOAL #1  ? Title decrease pain 50%   ? Status Partially Met   ?  ? PT LONG TERM GOAL #2  ? Title increase lumbar ROM 25% with less pain   ? Status Achieved   ?  ? PT LONG TERM GOAL #3  ? Title increase LE strength to 4+/5   ? Status Partially Met   ?  ? PT LONG TERM GOAL #4  ? Title go up and down stairs step over step   ? Status Partially Met   ?  ? PT LONG TERM GOAL #5  ? Title decrease TUG to 18 seconds   ? Status Partially Met   ? ?  ?  ? ?  ? ? ? ? ? ? ? ? Plan - 06/22/21 1656   ? ? Clinical Impression Statement Patient had surgery on her left hand last week, tried to avoid using this today, she does report that she is having some pain and numbness in her rgiht hip and leg, she reports that she sat in an uncomfortable chair waiting at an eye MD appointment.  She was a little off balance today and she blamed it on the right LE. She has had some other health issues that has limited her ability to attend PT, she overall is doing well, now using a SPC for all locomotion however today she was a little more of balance.  The left hip and ankle are weaker but improving   ? PT Next Visit Plan will try to resume PT, avoid the left hand issues working on strength balance and gait   ? Consulted and Agree with Plan of Care Patient   ? ?  ?  ? ?  ? ? ?Patient will benefit from skilled therapeutic intervention in order to improve the following deficits and impairments:  Abnormal gait, Decreased range of motion, Decreased balance, Decreased scar mobility, Impaired  flexibility, Decreased strength, Decreased mobility, Decreased endurance, Pain, Cardiopulmonary status limiting activity, Decreased activity tolerance, Postural dysfunction, Improper body mechanics, Incr

## 2021-06-24 ENCOUNTER — Ambulatory Visit: Payer: Medicare Other | Admitting: Physical Therapy

## 2021-06-29 ENCOUNTER — Ambulatory Visit: Payer: Medicare Other | Admitting: Physical Therapy

## 2021-06-29 ENCOUNTER — Encounter: Payer: Self-pay | Admitting: Physical Therapy

## 2021-06-29 ENCOUNTER — Other Ambulatory Visit: Payer: Self-pay

## 2021-06-29 DIAGNOSIS — M546 Pain in thoracic spine: Secondary | ICD-10-CM

## 2021-06-29 DIAGNOSIS — M545 Low back pain, unspecified: Secondary | ICD-10-CM

## 2021-06-29 DIAGNOSIS — R262 Difficulty in walking, not elsewhere classified: Secondary | ICD-10-CM

## 2021-06-29 DIAGNOSIS — M6281 Muscle weakness (generalized): Secondary | ICD-10-CM

## 2021-06-29 DIAGNOSIS — M62830 Muscle spasm of back: Secondary | ICD-10-CM

## 2021-06-29 NOTE — Therapy (Signed)
[place] [facility] ?[place], [place], [address] ?Phone: [phone]   Fax:  [phone] ? ?Physical Therapy Treatment ? ?Patient Details  ?Name: [name] ?MRN: [id number] ?Date of Birth: 01-31-1933 ?Referring Provider (PT): Elsner ? ? ?Encounter Date: 06/29/2021 ? ? PT End of Session - 06/29/21 1650   ? ? Visit Number 32   ? Date for PT Re-Evaluation 07/23/21   ? Authorization Type [employer] medicare   ? PT Start Time 1602   ? PT Stop Time 9024   ? PT Time Calculation (min) 48 min   ? Activity Tolerance Patient tolerated treatment well   ? Behavior During Therapy [facility] for tasks assessed/performed   ? ?  ?  ? ?  ? ? ?Past Medical History:  ?Diagnosis Date  ? Acalculous cholecystitis 08/24/2018  ? Arthritis   ? DVT of lower extremity (deep venous thrombosis) ([place])   ? right leg - after back surgery in 2019  ? GERD (gastroesophageal reflux disease)   ? History of blood transfusion   ? Hypothyroidism   ? PONV (postoperative nausea and vomiting)   ? Ptosis of both eyelids   ? Vertigo   ? Wears glasses   ? reading  ? ? ?Past Surgical History:  ?Procedure Laterality Date  ? ABDOMINAL HYSTERECTOMY  1986  ? APPLICATION OF ROBOTIC ASSISTANCE FOR SPINAL PROCEDURE N/A 07/17/2017  ? Procedure: APPLICATION OF ROBOTIC ASSISTANCE FOR SPINAL PROCEDURE;  Surgeon: Kristeen Miss, MD;  Location: [place];  Service: Neurosurgery;  Laterality: N/A;  ? APPLICATION OF ROBOTIC ASSISTANCE FOR SPINAL PROCEDURE N/A 09/01/2017  ? Procedure: APPLICATION OF ROBOTIC ASSISTANCE FOR SPINAL PROCEDURE;  Surgeon: Kristeen Miss, MD;  Location: [place];  Service: Neurosurgery;  Laterality: N/A;  ? APPLICATION OF ROBOTIC ASSISTANCE FOR SPINAL PROCEDURE N/A 10/27/2020  ? Procedure: APPLICATION OF ROBOTIC ASSISTANCE FOR SPINAL PROCEDURE;  Surgeon: Kristeen Miss, MD;  Location: [place];  Service: Neurosurgery;  Laterality: N/A;  ? [place]  ? lumb lam  ? CARPAL TUNNEL RELEASE  2000  ? right  ? CARPAL  TUNNEL RELEASE  2012  ? left  ? CHOLECYSTECTOMY N/A 08/24/2018  ? Procedure: LAPAROSCOPIC CHOLECYSTECTOMY WITH INTRAOPERATIVE CHOLANGIOGRAM;  Surgeon: Fanny Skates, MD;  Location: [place];  Service: General;  Laterality: N/A;  ? COLONOSCOPY    ? CYSTOCELE REPAIR  2007  ? sling  ? DISTAL INTERPHALANGEAL JOINT FUSION Right 02/04/2014  ? Procedure: FUSION DISTAL INTERPHALANGEAL JOINT RIGHT INDEX FINGER ;  Surgeon: Daryll Brod, MD;  Location: [place];  Service: Orthopedics;  Laterality: Right;  ? EYE SURGERY Bilateral   ? Cataract surgery with lens implant  ? KNEE SURGERY  2009,2011  ? partial knee  ? LAPAROSCOPY N/A 08/24/2018  ? Procedure: LAPAROSCOPY DIAGNOSTIC;  Surgeon: Fanny Skates, MD;  Location: [place];  Service: General;  Laterality: N/A;  ? LUMBAR PERCUTANEOUS PEDICLE SCREW 4 LEVEL N/A 10/27/2020  ? Procedure: Thoracic nine-ten Laminectomy with extension of fusion from Thoracic ten to Thoracic six with segmental fixation (cemented) with pedicle screw and mazor;  Surgeon: Kristeen Miss, MD;  Location: [place];  Service: Neurosurgery;  Laterality: N/A;  ? OPEN REDUCTION INTERNAL FIXATION (ORIF) PROXIMAL PHALANX Left 08/30/2013  ? Procedure: OPEN REDUCTION INTERNAL FIXATION (ORIF) PROXIMAL PHALANX FRACTURE LEFT SMALL FINGER; SPLINT RING FINGER;  Surgeon: Wynonia Sours, MD;  Location: [place];  Service: Orthopedics;  Laterality: Left;  ? POSTERIOR LUMBAR FUSION 4 LEVEL N/A  07/17/2017  ? Procedure: Thoracic Ten - Lumbar Five revison of hardware with Mazor;  Surgeon: Kristeen Miss, MD;  Location: [place];  Service: Neurosurgery;  Laterality: N/A;  Thoracic/Lumbar  ? PTOSIS REPAIR Bilateral 07/27/2015  ? Procedure: PTOSIS REPAIR;  Surgeon: Cristine Polio, MD;  Location: [place];  Service: Plastics;  Laterality: Bilateral;  ? SHOULDER SURGERY    ? rt rcr,and lt  ? TENOLYSIS Left 05/21/2021  ? Procedure: RELEASE EXTENSOR POLLICIS LONGUS LEFT;  Surgeon: Daryll Brod, MD;   Location: [place];  Service: Orthopedics;  Laterality: Left;  ? TENOLYSIS Left 06/08/2021  ? Procedure: RELEASE EXTENSOR POLLICIS LONGUS LEFT;  Surgeon: Daryll Brod, MD;  Location: [place];  Service: Orthopedics;  Laterality: Left;  ? TONSILLECTOMY    ? TOTAL KNEE ARTHROPLASTY Right 12/23/2019  ? Procedure: TOTAL KNEE ARTHROPLASTY;  Surgeon: Vickey Huger, MD;  [facility];  Service: Orthopedics;  Laterality: Right;  ? TRIGGER FINGER RELEASE Left 05/21/2021  ? Procedure: RELEASE TRIGGER FINGER/A-1 PULLEY LEFT MIDDLE FINGER;  Surgeon: Daryll Brod, MD;  Location: [place];  Service: Orthopedics;  Laterality: Left;  ? TRIGGER FINGER RELEASE Left 06/08/2021  ? Procedure: RELEASE A-1 PULLEY LEFT MIDDLE FINGER;  Surgeon: Daryll Brod, MD;  Location: [place];  Service: Orthopedics;  Laterality: Left;  ? ? ?There were no vitals filed for this visit. ? ? Subjective Assessment - 06/29/21 1608   ? ? Subjective I am doing prett good.  Patient reports that she got an injection in the knee last week, she reports that has helped   ? Currently in Pain? No/denies   ? ?  ?  ? ?  ? ? ? ? ? ? ? ? ? ? ? ? ? ? ? ? ? ? ? ? [place] Adult PT Treatment/Exercise - 06/29/21 0001   ? ?  ? Ambulation/Gait  ? Gait Comments SPC outside slope, 6 curbs, needing Min A due to some impuliveness, then a rest and then walking in the grass around the side of the building with [facility] and CGA   ?  ? Knee/Hip Exercises: Aerobic  ? Nustep level 5 x 5 minutes   ?  ? Knee/Hip Exercises: Machines for Strengthening  ? Cybex Knee Extension 5# 2x10   ? Cybex Knee Flexion 25lb 3x10   ?  ? Knee/Hip Exercises: Standing  ? Hip Flexion Both;2 sets;10 reps   ? Hip Flexion Limitations 3#   ? Hip Abduction 10 reps;Both;2 sets   ? Abduction Limitations 3#   ? ?  ?  ? ?  ? ? ? ? ? ? ? ? ? ? ? ? PT Short Term Goals - 04/15/21 1638   ? ?  ? PT SHORT TERM GOAL #1  ? Title independent with initial HEP   ? Status  Achieved   ? ?  ?  ? ?  ? ? ? ? PT Long Term Goals - 06/22/21 1701   ? ?  ? PT LONG TERM GOAL #1  ? Title decrease pain 50%   ? Status Partially Met   ?  ? PT LONG TERM GOAL #2  ? Title increase lumbar ROM 25% with less pain   ? Status Achieved   ?  ? PT LONG TERM GOAL #3  ? Title increase LE strength to 4+/5   ? Status Partially Met   ?  ? PT LONG TERM GOAL #4  ? Title go  up and down stairs step over step   ? Status Partially Met   ?  ? PT LONG TERM GOAL #5  ? Title decrease TUG to 18 seconds   ? Status Partially Met   ? ?  ?  ? ?  ? ? ? ? ? ? ? ? Plan - 06/29/21 1650   ? ? Clinical Impression Statement Patient still recovering from surgery on the left hand, I did walking with her outside with a SPC, down a slope, negotiated 6 curbs and other slanted type asphalt and concrete and small pavers.  We then rested and walked with SPC and CGA around the side of the building in the grass.  With the curbs she demonstrated impulsiveness and had two times requiring min A to correct LOB, she did better in the grass, but at times she is a little too fast and unsafe   ? PT Next Visit Plan try to educate her to be safer   ? Consulted and Agree with Plan of Care Patient   ? ?  ?  ? ?  ? ? ?Patient will benefit from skilled therapeutic intervention in order to improve the following deficits and impairments:  Abnormal gait, Decreased range of motion, Decreased balance, Decreased scar mobility, Impaired flexibility, Decreased strength, Decreased mobility, Decreased endurance, Pain, Cardiopulmonary status limiting activity, Decreased activity tolerance, Postural dysfunction, Improper body mechanics, Increased muscle spasms, Difficulty walking ? ?Visit Diagnosis: ?Acute bilateral low back pain without sciatica ? ?Muscle weakness (generalized) ? ?Difficulty in walking, not elsewhere classified ? ?Muscle spasm of back ? ?Pain in thoracic spine ? ? ? ? ?Problem List ?Patient Active Problem List  ? Diagnosis Date Noted  ? Myelopathy  concurrent with and due to spinal stenosis of thoracic region [facility]) 10/27/2020  ? S/P total knee arthroplasty, right 12/23/2019  ? Acalculous cholecystitis 08/24/2018  ? Osteoarthritis of right knee   ? Vertigo

## 2021-07-01 ENCOUNTER — Encounter: Payer: Self-pay | Admitting: Physical Therapy

## 2021-07-01 ENCOUNTER — Ambulatory Visit: Payer: Medicare Other | Admitting: Physical Therapy

## 2021-07-01 DIAGNOSIS — M546 Pain in thoracic spine: Secondary | ICD-10-CM

## 2021-07-01 DIAGNOSIS — M545 Low back pain, unspecified: Secondary | ICD-10-CM

## 2021-07-01 DIAGNOSIS — R262 Difficulty in walking, not elsewhere classified: Secondary | ICD-10-CM

## 2021-07-01 DIAGNOSIS — M62830 Muscle spasm of back: Secondary | ICD-10-CM

## 2021-07-01 DIAGNOSIS — M6281 Muscle weakness (generalized): Secondary | ICD-10-CM

## 2021-07-01 NOTE — Therapy (Signed)
[place] [facility] ?[place], [place], [address] ?Phone: [phone]   Fax:  [phone] ? ?Physical Therapy Treatment ? ?Patient Details  ?Name: [name] ?MRN: [id number] ?Date of Birth: 1932-11-08 ?Referring Provider (PT): Elsner ? ? ?Encounter Date: 07/01/2021 ? ? PT End of Session - 07/01/21 1611   ? ? Visit Number [id number]   ? Date for PT Re-Evaluation 07/23/21   ? Authorization Type UHC medicare   ? PT Start Time 1520   ? PT Stop Time 1829   ? PT Time Calculation (min) 43 min   ? Activity Tolerance Patient tolerated treatment well   ? Behavior During Therapy [facility] for tasks assessed/performed   ? ?  ?  ? ?  ? ? ?Past Medical History:  ?Diagnosis Date  ? Acalculous cholecystitis 08/24/2018  ? Arthritis   ? DVT of lower extremity (deep venous thrombosis) ([place])   ? right leg - after back surgery in 2019  ? GERD (gastroesophageal reflux disease)   ? History of blood transfusion   ? Hypothyroidism   ? PONV (postoperative nausea and vomiting)   ? Ptosis of both eyelids   ? Vertigo   ? Wears glasses   ? reading  ? ? ?Past Surgical History:  ?Procedure Laterality Date  ? ABDOMINAL HYSTERECTOMY  1986  ? APPLICATION OF ROBOTIC ASSISTANCE FOR SPINAL PROCEDURE N/A 07/17/2017  ? Procedure: APPLICATION OF ROBOTIC ASSISTANCE FOR SPINAL PROCEDURE;  Surgeon: Kristeen Miss, MD;  Location: [place];  Service: Neurosurgery;  Laterality: N/A;  ? APPLICATION OF ROBOTIC ASSISTANCE FOR SPINAL PROCEDURE N/A 09/01/2017  ? Procedure: APPLICATION OF ROBOTIC ASSISTANCE FOR SPINAL PROCEDURE;  Surgeon: Kristeen Miss, MD;  Location: [place];  Service: Neurosurgery;  Laterality: N/A;  ? APPLICATION OF ROBOTIC ASSISTANCE FOR SPINAL PROCEDURE N/A 10/27/2020  ? Procedure: APPLICATION OF ROBOTIC ASSISTANCE FOR SPINAL PROCEDURE;  Surgeon: Kristeen Miss, MD;  Location: [place];  Service: Neurosurgery;  Laterality: N/A;  ? [place]  ? lumb lam  ? CARPAL TUNNEL RELEASE  2000  ? right  ? CARPAL  TUNNEL RELEASE  2012  ? left  ? CHOLECYSTECTOMY N/A 08/24/2018  ? Procedure: LAPAROSCOPIC CHOLECYSTECTOMY WITH INTRAOPERATIVE CHOLANGIOGRAM;  Surgeon: Fanny Skates, MD;  Location: [place];  Service: General;  Laterality: N/A;  ? COLONOSCOPY    ? CYSTOCELE REPAIR  2007  ? sling  ? DISTAL INTERPHALANGEAL JOINT FUSION Right 02/04/2014  ? Procedure: FUSION DISTAL INTERPHALANGEAL JOINT RIGHT INDEX FINGER ;  Surgeon: Daryll Brod, MD;  Location: [place];  Service: Orthopedics;  Laterality: Right;  ? EYE SURGERY Bilateral   ? Cataract surgery with lens implant  ? KNEE SURGERY  2009,2011  ? partial knee  ? LAPAROSCOPY N/A 08/24/2018  ? Procedure: LAPAROSCOPY DIAGNOSTIC;  Surgeon: Fanny Skates, MD;  Location: [place];  Service: General;  Laterality: N/A;  ? LUMBAR PERCUTANEOUS PEDICLE SCREW 4 LEVEL N/A 10/27/2020  ? Procedure: Thoracic nine-ten Laminectomy with extension of fusion from Thoracic ten to Thoracic six with segmental fixation (cemented) with pedicle screw and mazor;  Surgeon: Kristeen Miss, MD;  Location: [place];  Service: Neurosurgery;  Laterality: N/A;  ? OPEN REDUCTION INTERNAL FIXATION (ORIF) PROXIMAL PHALANX Left 08/30/2013  ? Procedure: OPEN REDUCTION INTERNAL FIXATION (ORIF) PROXIMAL PHALANX FRACTURE LEFT SMALL FINGER; SPLINT RING FINGER;  Surgeon: Wynonia Sours, MD;  Location: [place];  Service: Orthopedics;  Laterality: Left;  ? POSTERIOR LUMBAR FUSION 4 LEVEL N/A  07/17/2017  ? Procedure: Thoracic Ten - Lumbar Five revison of hardware with Mazor;  Surgeon: Kristeen Miss, MD;  Location: [place];  Service: Neurosurgery;  Laterality: N/A;  Thoracic/Lumbar  ? PTOSIS REPAIR Bilateral 07/27/2015  ? Procedure: PTOSIS REPAIR;  Surgeon: Cristine Polio, MD;  Location: [place];  Service: Plastics;  Laterality: Bilateral;  ? SHOULDER SURGERY    ? rt rcr,and lt  ? TENOLYSIS Left 05/21/2021  ? Procedure: RELEASE EXTENSOR POLLICIS LONGUS LEFT;  Surgeon: Daryll Brod, MD;   Location: [place];  Service: Orthopedics;  Laterality: Left;  ? TENOLYSIS Left 06/08/2021  ? Procedure: RELEASE EXTENSOR POLLICIS LONGUS LEFT;  Surgeon: Daryll Brod, MD;  Location: [place];  Service: Orthopedics;  Laterality: Left;  ? TONSILLECTOMY    ? TOTAL KNEE ARTHROPLASTY Right 12/23/2019  ? Procedure: TOTAL KNEE ARTHROPLASTY;  Surgeon: Vickey Huger, MD;  [facility];  Service: Orthopedics;  Laterality: Right;  ? TRIGGER FINGER RELEASE Left 05/21/2021  ? Procedure: RELEASE TRIGGER FINGER/A-1 PULLEY LEFT MIDDLE FINGER;  Surgeon: Daryll Brod, MD;  Location: [place];  Service: Orthopedics;  Laterality: Left;  ? TRIGGER FINGER RELEASE Left 06/08/2021  ? Procedure: RELEASE A-1 PULLEY LEFT MIDDLE FINGER;  Surgeon: Daryll Brod, MD;  Location: [place];  Service: Orthopedics;  Laterality: Left;  ? ? ?There were no vitals filed for this visit. ? ? Subjective Assessment - 07/01/21 1523   ? ? Subjective Doing better   ? Currently in Pain? No/denies   ? ?  ?  ? ?  ? ? ? ? ? ? ? ? ? ? ? ? ? ? ? ? ? ? ? ? [place] Adult PT Treatment/Exercise - 07/01/21 0001   ? ?  ? Ambulation/Gait  ? Gait Comments gait with SPC, and HHA in the grass and having her try to scan ahead and see the obstacles, holes tree roots, tends to hold head down and needs cues to look up and scan as she stepped in a few holes   ?  ? High Level Balance  ? High Level Balance Comments airex balance beam side stepping in pbars   ?  ? Knee/Hip Exercises: Stretches  ? Passive Hamstring Stretch Left;3 reps;20 seconds   ? Piriformis Stretch 3 reps;Both;20 seconds   ?  ? Knee/Hip Exercises: Aerobic  ? Recumbent Bike level 3 x 6 minutes   ?  ? Knee/Hip Exercises: Machines for Strengthening  ? Cybex Knee Extension 5# 2x10   ? Cybex Knee Flexion 25lb 3x10   ? Other Machine 20# resisted gait fwd/bkwd, 10# side stepping   ?  ? Knee/Hip Exercises: Supine  ? Other Supine Knee/Hip Exercises feet on ball K2C,  trunk rotation, small bridges and isometric abs   ? ?  ?  ? ?  ? ? ? ? ? ? ? ? ? ? ? ? PT Short Term Goals - 04/15/21 1638   ? ?  ? PT SHORT TERM GOAL #1  ? Title independent with initial HEP   ? Status Achieved   ? ?  ?  ? ?  ? ? ? ? PT Long Term Goals - 07/01/21 1613   ? ?  ? PT LONG TERM GOAL #1  ? Title decrease pain 50%   ? Status Achieved   ? ?  ?  ? ?  ? ? ? ? ? ? ? ? Plan - 07/01/21 1612   ? ? Clinical  Impression Statement I added the resisted gait in, we walked outside in the grass and the slopes, I added the ball exercises back, she did well, in the gym on even surfaces she is looking good without much difficulty using a SPC, her goal is to walk outside with the [facility], she is a little more unsafe with not looking up   ? PT Next Visit Plan walking outside and wafety   ? Consulted and Agree with Plan of Care Patient   ? ?  ?  ? ?  ? ? ?Patient will benefit from skilled therapeutic intervention in order to improve the following deficits and impairments:  Abnormal gait, Decreased range of motion, Decreased balance, Decreased scar mobility, Impaired flexibility, Decreased strength, Decreased mobility, Decreased endurance, Pain, Cardiopulmonary status limiting activity, Decreased activity tolerance, Postural dysfunction, Improper body mechanics, Increased muscle spasms, Difficulty walking ? ?Visit Diagnosis: ?Acute bilateral low back pain without sciatica ? ?Muscle weakness (generalized) ? ?Difficulty in walking, not elsewhere classified ? ?Muscle spasm of back ? ?Pain in thoracic spine ? ? ? ? ?Problem List ?Patient Active Problem List  ? Diagnosis Date Noted  ? Myelopathy concurrent with and due to spinal stenosis of thoracic region [facility]) 10/27/2020  ? S/P total knee arthroplasty, right 12/23/2019  ? Acalculous cholecystitis 08/24/2018  ? Osteoarthritis of right knee   ? Vertigo   ? DDD (degenerative disc disease), cervical   ? Benign essential HTN   ? History of DVT (deep vein thrombosis)   ? Tachycardia   ?  Steroid-induced hyperglycemia   ? Hypothyroidism   ? Neuropathic pain   ? Acute blood loss anemia   ? S/P lumbar fusion   ? Closed L5 vertebral fracture ([place]) 08/31/2017  ? Closed fracture of fifth lumbar

## 2021-07-07 ENCOUNTER — Encounter: Payer: Self-pay | Admitting: Physical Therapy

## 2021-07-07 ENCOUNTER — Ambulatory Visit: Payer: Medicare Other | Attending: Neurological Surgery | Admitting: Physical Therapy

## 2021-07-07 DIAGNOSIS — M6281 Muscle weakness (generalized): Secondary | ICD-10-CM

## 2021-07-07 DIAGNOSIS — M546 Pain in thoracic spine: Secondary | ICD-10-CM

## 2021-07-07 DIAGNOSIS — M545 Low back pain, unspecified: Secondary | ICD-10-CM

## 2021-07-07 DIAGNOSIS — R262 Difficulty in walking, not elsewhere classified: Secondary | ICD-10-CM | POA: Diagnosis present

## 2021-07-07 DIAGNOSIS — M62830 Muscle spasm of back: Secondary | ICD-10-CM

## 2021-07-07 NOTE — Therapy (Signed)
[place] ?[address] ?[place], [place], [address] ?Phone: [phone]   Fax:  [phone] ? ?Physical Therapy Treatment ? ?Patient Details  ?Name: [name] ?MRN: [id number] ?Date of Birth: [birth year] ?Referring Provider (PT): Elsner ? ? ?Encounter Date: 07/07/2021 ? ? PT End of Session - 07/07/21 1738   ? ? Visit Number 34   ? Date for PT Re-Evaluation 07/23/21   ? Authorization Type UHC medicare   ? PT Start Time 4081   ? PT Stop Time 1738   ? PT Time Calculation (min) 48 min   ? Activity Tolerance Patient tolerated treatment well   ? Behavior During Therapy [facility] for tasks assessed/performed   ? ?  ?  ? ?  ? ? ?Past Medical History:  ?Diagnosis Date  ? Acalculous cholecystitis 08/24/2018  ? Arthritis   ? DVT of lower extremity (deep venous thrombosis) ([place])   ? right leg - after back surgery in 2019  ? GERD (gastroesophageal reflux disease)   ? History of blood transfusion   ? Hypothyroidism   ? PONV (postoperative nausea and vomiting)   ? Ptosis of both eyelids   ? Vertigo   ? Wears glasses   ? reading  ? ? ?Past Surgical History:  ?Procedure Laterality Date  ? ABDOMINAL HYSTERECTOMY  1986  ? APPLICATION OF ROBOTIC ASSISTANCE FOR SPINAL PROCEDURE N/A 07/17/2017  ? Procedure: APPLICATION OF ROBOTIC ASSISTANCE FOR SPINAL PROCEDURE;  Surgeon: Kristeen Miss, MD;  Location: [place];  Service: Neurosurgery;  Laterality: N/A;  ? APPLICATION OF ROBOTIC ASSISTANCE FOR SPINAL PROCEDURE N/A 09/01/2017  ? Procedure: APPLICATION OF ROBOTIC ASSISTANCE FOR SPINAL PROCEDURE;  Surgeon: Kristeen Miss, MD;  Location: [place];  Service: Neurosurgery;  Laterality: N/A;  ? APPLICATION OF ROBOTIC ASSISTANCE FOR SPINAL PROCEDURE N/A 10/27/2020  ? Procedure: APPLICATION OF ROBOTIC ASSISTANCE FOR SPINAL PROCEDURE;  Surgeon: Kristeen Miss, MD;  Location: [place];  Service: Neurosurgery;  Laterality: N/A;  ? [place]  ? lumb lam  ? CARPAL TUNNEL RELEASE  2000  ? right  ? CARPAL  TUNNEL RELEASE  2012  ? left  ? CHOLECYSTECTOMY N/A 08/24/2018  ? Procedure: LAPAROSCOPIC CHOLECYSTECTOMY WITH INTRAOPERATIVE CHOLANGIOGRAM;  Surgeon: Fanny Skates, MD;  Location: [place];  Service: General;  Laterality: N/A;  ? COLONOSCOPY    ? CYSTOCELE REPAIR  2007  ? sling  ? DISTAL INTERPHALANGEAL JOINT FUSION Right 02/04/2014  ? Procedure: FUSION DISTAL INTERPHALANGEAL JOINT RIGHT INDEX FINGER ;  Surgeon: Daryll Brod, MD;  Location: [place];  Service: Orthopedics;  Laterality: Right;  ? EYE SURGERY Bilateral   ? Cataract surgery with lens implant  ? KNEE SURGERY  2009,2011  ? partial knee  ? LAPAROSCOPY N/A 08/24/2018  ? Procedure: LAPAROSCOPY DIAGNOSTIC;  Surgeon: Fanny Skates, MD;  Location: [place];  Service: General;  Laterality: N/A;  ? LUMBAR PERCUTANEOUS PEDICLE SCREW 4 LEVEL N/A 10/27/2020  ? Procedure: Thoracic nine-ten Laminectomy with extension of fusion from Thoracic ten to Thoracic six with segmental fixation (cemented) with pedicle screw and mazor;  Surgeon: Kristeen Miss, MD;  Location: [place];  Service: Neurosurgery;  Laterality: N/A;  ? OPEN REDUCTION INTERNAL FIXATION (ORIF) PROXIMAL PHALANX Left 08/30/2013  ? Procedure: OPEN REDUCTION INTERNAL FIXATION (ORIF) PROXIMAL PHALANX FRACTURE LEFT SMALL FINGER; SPLINT RING FINGER;  Surgeon: Wynonia Sours, MD;  Location: [place];  Service: Orthopedics;  Laterality: Left;  ? POSTERIOR LUMBAR FUSION 4 LEVEL N/A  07/17/2017  ? Procedure: Thoracic Ten - Lumbar Five revison of hardware with Mazor;  Surgeon: Kristeen Miss, MD;  Location: [place];  Service: Neurosurgery;  Laterality: N/A;  Thoracic/Lumbar  ? PTOSIS REPAIR Bilateral 07/27/2015  ? Procedure: PTOSIS REPAIR;  Surgeon: Cristine Polio, MD;  Location: [place];  Service: Plastics;  Laterality: Bilateral;  ? SHOULDER SURGERY    ? rt rcr,and lt  ? TENOLYSIS Left 05/21/2021  ? Procedure: RELEASE EXTENSOR POLLICIS LONGUS LEFT;  Surgeon: Daryll Brod, MD;   Location: [place];  Service: Orthopedics;  Laterality: Left;  ? TENOLYSIS Left 06/08/2021  ? Procedure: RELEASE EXTENSOR POLLICIS LONGUS LEFT;  Surgeon: Daryll Brod, MD;  Location: [place];  Service: Orthopedics;  Laterality: Left;  ? TONSILLECTOMY    ? TOTAL KNEE ARTHROPLASTY Right 12/23/2019  ? Procedure: TOTAL KNEE ARTHROPLASTY;  Surgeon: Vickey Huger, MD;  [facility];  Service: Orthopedics;  Laterality: Right;  ? TRIGGER FINGER RELEASE Left 05/21/2021  ? Procedure: RELEASE TRIGGER FINGER/A-1 PULLEY LEFT MIDDLE FINGER;  Surgeon: Daryll Brod, MD;  Location: [place];  Service: Orthopedics;  Laterality: Left;  ? TRIGGER FINGER RELEASE Left 06/08/2021  ? Procedure: RELEASE A-1 PULLEY LEFT MIDDLE FINGER;  Surgeon: Daryll Brod, MD;  Location: [place];  Service: Orthopedics;  Laterality: Left;  ? ? ?There were no vitals filed for this visit. ? ? Subjective Assessment - 07/07/21 1710   ? ? Subjective Patient reports not feeling well, report no pain just an overall malaise, no energy, i checked BP and was 142/84   ? Currently in Pain? Yes   ? ?  ?  ? ?  ? ? ? ? ? ? ? ? ? ? ? ? ? ? ? ? ? ? ? ? [place] Adult PT Treatment/Exercise - 07/07/21 0001   ? ?  ? Ambulation/Gait  ? Gait Comments gait with SPC outside around the parking island and negotiating curbs   ?  ? High Level Balance  ? High Level Balance Comments airex cone toe touches with hand holds, then standing head turns and eyes closed, this is scary for her   ?  ? Knee/Hip Exercises: Machines for Strengthening  ? Cybex Knee Extension 5# 2x10   ? Cybex Knee Flexion 25lb 3x10   ? Other Machine 20# resisted gait fwd/bkwd, 10# side stepping   ?  ? Knee/Hip Exercises: Standing  ? Hip Flexion Both;2 sets;10 reps   ? Hip Flexion Limitations 3#   ? Hip Abduction 10 reps;Both;2 sets   ? Abduction Limitations 3#   ? Hip Extension 10 reps;Both;2 sets   ? Extension Limitations 3#   ? ?  ?  ? ?   ? ? ? ? ? ? ? ? ? ? ? ? PT Short Term Goals - 04/15/21 1638   ? ?  ? PT SHORT TERM GOAL #1  ? Title independent with initial HEP   ? Status Achieved   ? ?  ?  ? ?  ? ? ? ? PT Long Term Goals - 07/07/21 1741   ? ?  ? PT LONG TERM GOAL #1  ? Title decrease pain 50%   ? Status Achieved   ?  ? PT LONG TERM GOAL #2  ? Title increase lumbar ROM 25% with less pain   ? Status Partially Met   ?  ? PT LONG TERM GOAL #3  ? Title increase LE strength to 4+/5   ?  Status Partially Met   ? ?  ?  ? ?  ? ? ? ? ? ? ? ? Plan - 07/07/21 1739   ? ? Clinical Impression Statement Patient reported not feeling well today, I checked her BP with the machine and it was 158/95, she said that was not correct, I took it manually and it was 142/84, she said that was not normal, she denies pain just reports that she feels tired, she reports that she does not take BP medication and does not have HTN.  She really has difficulty with the airex standing.  Right hip and ankle are very weak   ? PT Next Visit Plan continue to work on balance and strength   ? Consulted and Agree with Plan of Care Patient   ? ?  ?  ? ?  ? ? ?Patient will benefit from skilled therapeutic intervention in order to improve the following deficits and impairments:  Abnormal gait, Decreased range of motion, Decreased balance, Decreased scar mobility, Impaired flexibility, Decreased strength, Decreased mobility, Decreased endurance, Pain, Cardiopulmonary status limiting activity, Decreased activity tolerance, Postural dysfunction, Improper body mechanics, Increased muscle spasms, Difficulty walking ? ?Visit Diagnosis: ?Acute bilateral low back pain without sciatica ? ?Muscle weakness (generalized) ? ?Difficulty in walking, not elsewhere classified ? ?Muscle spasm of back ? ?Pain in thoracic spine ? ? ? ? ?Problem List ?Patient Active Problem List  ? Diagnosis Date Noted  ? Myelopathy concurrent with and due to spinal stenosis of thoracic region [facility]) 10/27/2020  ? S/P total knee  arthroplasty, right 12/23/2019  ? Acalculous cholecystitis 08/24/2018  ? Osteoarthritis of right knee   ? Vertigo   ? DDD (degenerative disc disease), cervical   ? Benign essential HTN   ? History of DVT (deep vein thrombos

## 2021-07-13 ENCOUNTER — Encounter: Payer: Self-pay | Admitting: Physical Therapy

## 2021-07-13 ENCOUNTER — Ambulatory Visit: Payer: Medicare Other | Admitting: Physical Therapy

## 2021-07-13 DIAGNOSIS — M6281 Muscle weakness (generalized): Secondary | ICD-10-CM

## 2021-07-13 DIAGNOSIS — M545 Low back pain, unspecified: Secondary | ICD-10-CM

## 2021-07-13 DIAGNOSIS — R262 Difficulty in walking, not elsewhere classified: Secondary | ICD-10-CM

## 2021-07-13 NOTE — Therapy (Signed)
[place] [address] ?[place], [place], [address] ?Phone: [phone]   Fax:  [phone] ? ?Physical Therapy Treatment ? ?Patient Details  ?Name: [name] ?MRN: [id number] ?Date of Birth: 06/15/1932 ?Referring Provider (PT): Elsner ? ? ?Encounter Date: 07/13/2021 ? ? PT End of Session - 07/13/21 1554   ? ? Visit Number 35   ? Date for PT Re-Evaluation 07/23/21   ? Authorization Type UHC medicare   ? PT Start Time 1511   ? PT Stop Time 4097   ? PT Time Calculation (min) 44 min   ? Activity Tolerance Patient tolerated treatment well   ? Behavior During Therapy [facility] for tasks assessed/performed   ? ?  ?  ? ?  ? ? ?Past Medical History:  ?Diagnosis Date  ? Acalculous cholecystitis 08/24/2018  ? Arthritis   ? DVT of lower extremity (deep venous thrombosis) ([place])   ? right leg - after back surgery in 2019  ? GERD (gastroesophageal reflux disease)   ? History of blood transfusion   ? Hypothyroidism   ? PONV (postoperative nausea and vomiting)   ? Ptosis of both eyelids   ? Vertigo   ? Wears glasses   ? reading  ? ? ?Past Surgical History:  ?Procedure Laterality Date  ? ABDOMINAL HYSTERECTOMY  1986  ? APPLICATION OF ROBOTIC ASSISTANCE FOR SPINAL PROCEDURE N/A 07/17/2017  ? Procedure: APPLICATION OF ROBOTIC ASSISTANCE FOR SPINAL PROCEDURE;  Surgeon: Kristeen Miss, MD;  Location: [place];  Service: Neurosurgery;  Laterality: N/A;  ? APPLICATION OF ROBOTIC ASSISTANCE FOR SPINAL PROCEDURE N/A 09/01/2017  ? Procedure: APPLICATION OF ROBOTIC ASSISTANCE FOR SPINAL PROCEDURE;  Surgeon: Kristeen Miss, MD;  Location: [place];  Service: Neurosurgery;  Laterality: N/A;  ? APPLICATION OF ROBOTIC ASSISTANCE FOR SPINAL PROCEDURE N/A 10/27/2020  ? Procedure: APPLICATION OF ROBOTIC ASSISTANCE FOR SPINAL PROCEDURE;  Surgeon: Kristeen Miss, MD;  Location: [place];  Service: Neurosurgery;  Laterality: N/A;  ? [place]  ? lumb lam  ? CARPAL TUNNEL RELEASE  2000  ? right  ? CARPAL  TUNNEL RELEASE  2012  ? left  ? CHOLECYSTECTOMY N/A 08/24/2018  ? Procedure: LAPAROSCOPIC CHOLECYSTECTOMY WITH INTRAOPERATIVE CHOLANGIOGRAM;  Surgeon: Fanny Skates, MD;  Location: [place];  Service: General;  Laterality: N/A;  ? COLONOSCOPY    ? CYSTOCELE REPAIR  2007  ? sling  ? DISTAL INTERPHALANGEAL JOINT FUSION Right 02/04/2014  ? Procedure: FUSION DISTAL INTERPHALANGEAL JOINT RIGHT INDEX FINGER ;  Surgeon: Daryll Brod, MD;  Location: [place];  Service: Orthopedics;  Laterality: Right;  ? EYE SURGERY Bilateral   ? Cataract surgery with lens implant  ? KNEE SURGERY  2009,2011  ? partial knee  ? LAPAROSCOPY N/A 08/24/2018  ? Procedure: LAPAROSCOPY DIAGNOSTIC;  Surgeon: Fanny Skates, MD;  Location: [place];  Service: General;  Laterality: N/A;  ? LUMBAR PERCUTANEOUS PEDICLE SCREW 4 LEVEL N/A 10/27/2020  ? Procedure: Thoracic nine-ten Laminectomy with extension of fusion from Thoracic ten to Thoracic six with segmental fixation (cemented) with pedicle screw and mazor;  Surgeon: Kristeen Miss, MD;  Location: [place];  Service: Neurosurgery;  Laterality: N/A;  ? OPEN REDUCTION INTERNAL FIXATION (ORIF) PROXIMAL PHALANX Left 08/30/2013  ? Procedure: OPEN REDUCTION INTERNAL FIXATION (ORIF) PROXIMAL PHALANX FRACTURE LEFT SMALL FINGER; SPLINT RING FINGER;  Surgeon: Wynonia Sours, MD;  Location: [place];  Service: Orthopedics;  Laterality: Left;  ? POSTERIOR LUMBAR FUSION 4 LEVEL N/A  07/17/2017  ? Procedure: Thoracic Ten - Lumbar Five revison of hardware with Mazor;  Surgeon: Kristeen Miss, MD;  Location: [place];  Service: Neurosurgery;  Laterality: N/A;  Thoracic/Lumbar  ? PTOSIS REPAIR Bilateral 07/27/2015  ? Procedure: PTOSIS REPAIR;  Surgeon: Cristine Polio, MD;  Location: [place];  Service: Plastics;  Laterality: Bilateral;  ? SHOULDER SURGERY    ? rt rcr,and lt  ? TENOLYSIS Left 05/21/2021  ? Procedure: RELEASE EXTENSOR POLLICIS LONGUS LEFT;  Surgeon: Daryll Brod, MD;   Location: [place];  Service: Orthopedics;  Laterality: Left;  ? TENOLYSIS Left 06/08/2021  ? Procedure: RELEASE EXTENSOR POLLICIS LONGUS LEFT;  Surgeon: Daryll Brod, MD;  Location: [place];  Service: Orthopedics;  Laterality: Left;  ? TONSILLECTOMY    ? TOTAL KNEE ARTHROPLASTY Right 12/23/2019  ? Procedure: TOTAL KNEE ARTHROPLASTY;  Surgeon: Vickey Huger, MD;  [facility];  Service: Orthopedics;  Laterality: Right;  ? TRIGGER FINGER RELEASE Left 05/21/2021  ? Procedure: RELEASE TRIGGER FINGER/A-1 PULLEY LEFT MIDDLE FINGER;  Surgeon: Daryll Brod, MD;  Location: [place];  Service: Orthopedics;  Laterality: Left;  ? TRIGGER FINGER RELEASE Left 06/08/2021  ? Procedure: RELEASE A-1 PULLEY LEFT MIDDLE FINGER;  Surgeon: Daryll Brod, MD;  Location: [place];  Service: Orthopedics;  Laterality: Left;  ? ? ?There were no vitals filed for this visit. ? ? Subjective Assessment - 07/13/21 1514   ? ? Subjective Pt reports walk with two canes outside. Feeling fine   ? Currently in Pain? No/denies   ? ?  ?  ? ?  ? ? ? ? ? ? ? ? ? ? ? ? ? ? ? ? ? ? ? ? [place] Adult PT Treatment/Exercise - 07/13/21 0001   ? ?  ? Ambulation/Gait  ? Gait Comments Gait No AD 76f x2  then 568f  ?  ? Knee/Hip Exercises: Aerobic  ? Recumbent Bike level 3.5 x 6 minutes   ?  ? Knee/Hip Exercises: Machines for Strengthening  ? Cybex Knee Extension 5# 2x10   ? Cybex Knee Flexion 25lb 3x10   ? Other Machine 20# resisted gait fwd/bkwd, 10# side stepping   CGA at times  ?  ? Knee/Hip Exercises: Standing  ? Hip Flexion Both;2 sets;10 reps   ? Hip Flexion Limitations 3#   ? Hip Abduction 10 reps;Both;2 sets   ? Abduction Limitations 3#   ? Hip Extension 10 reps;Both;2 sets   ? Extension Limitations 3#   ? Other Standing Knee Exercises Alt box taps 6 in 2x10 with SPC   ? ?  ?  ? ?  ? ? ? ? ? ? ? ? ? ? ? ? PT Short Term Goals - 04/15/21 1638   ? ?  ? PT SHORT TERM GOAL #1  ? Title independent with  initial HEP   ? Status Achieved   ? ?  ?  ? ?  ? ? ? ? PT Long Term Goals - 07/07/21 1741   ? ?  ? PT LONG TERM GOAL #1  ? Title decrease pain 50%   ? Status Achieved   ?  ? PT LONG TERM GOAL #2  ? Title increase lumbar ROM 25% with less pain   ? Status Partially Met   ?  ? PT LONG TERM GOAL #3  ? Title increase LE strength to 4+/5   ? Status Partially Met   ? ?  ?  ? ?  ? ? ? ? ? ? ? ?  Plan - 07/13/21 1554   ? ? Clinical Impression Statement Pt reports ambulating outside without with two canes. Some CGA needed with resisted gait during backwards and side steps. No reports of increase pain. Cue for eccentric control needed with leg curls and extensions. Some encouragement needed with gait without AD. Pt did demo good reciprocal gait pattern with ambulation.   ? Stability/Clinical Decision Making Evolving/Moderate complexity   ? Rehab Potential Good   ? PT Frequency 2x / week   ? PT Duration 8 weeks   ? PT Treatment/Interventions ADLs/Self Care Home Management;Cryotherapy;Gait training;Neuromuscular re-education;Balance training;Therapeutic exercise;Therapeutic activities;Functional mobility training;Stair training;Patient/family education;Manual techniques;Ultrasound;Moist Heat;Electrical Stimulation   ? PT Next Visit Plan continue to work on balance and strength   ? ?  ?  ? ?  ? ? ?Patient will benefit from skilled therapeutic intervention in order to improve the following deficits and impairments:  Abnormal gait, Decreased range of motion, Decreased balance, Decreased scar mobility, Impaired flexibility, Decreased strength, Decreased mobility, Decreased endurance, Pain, Cardiopulmonary status limiting activity, Decreased activity tolerance, Postural dysfunction, Improper body mechanics, Increased muscle spasms, Difficulty walking ? ?Visit Diagnosis: ?Acute bilateral low back pain without sciatica ? ?Muscle weakness (generalized) ? ?Difficulty in walking, not elsewhere classified ? ? ? ? ?Problem List ?Patient  Active Problem List  ? Diagnosis Date Noted  ? Myelopathy concurrent with and due to spinal stenosis of thoracic region [facility]) 10/27/2020  ? S/P total knee arthroplasty, right 12/23/2019  ? Acalculous cholecy

## 2021-07-15 ENCOUNTER — Encounter: Payer: Self-pay | Admitting: Physical Therapy

## 2021-07-15 ENCOUNTER — Ambulatory Visit: Payer: Medicare Other | Admitting: Physical Therapy

## 2021-07-15 DIAGNOSIS — R262 Difficulty in walking, not elsewhere classified: Secondary | ICD-10-CM

## 2021-07-15 DIAGNOSIS — M6281 Muscle weakness (generalized): Secondary | ICD-10-CM

## 2021-07-15 DIAGNOSIS — M546 Pain in thoracic spine: Secondary | ICD-10-CM

## 2021-07-15 DIAGNOSIS — M62830 Muscle spasm of back: Secondary | ICD-10-CM

## 2021-07-15 DIAGNOSIS — M545 Low back pain, unspecified: Secondary | ICD-10-CM | POA: Diagnosis not present

## 2021-07-15 NOTE — Therapy (Signed)
[place] ?[address] ?[place], [place], [address] ?Phone: [phone]   Fax:  [phone] ? ?Physical Therapy Treatment ? ?Patient Details  ?Name: [name] ?MRN: [id number] ?Date of Birth: 26-Apr-1932 ?Referring Provider (PT): Elsner ? ? ?Encounter Date: 07/15/2021 ? ? PT End of Session - 07/15/21 1710   ? ? Visit Number 36   ? Date for PT Re-Evaluation 07/23/21   ? Authorization Type UHC medicare   ? PT Start Time 1608   ? PT Stop Time 8550   ? PT Time Calculation (min) 46 min   ? Activity Tolerance Patient tolerated treatment well   ? Behavior During Therapy [facility] for tasks assessed/performed   ? ?  ?  ? ?  ? ? ?Past Medical History:  ?Diagnosis Date  ? Acalculous cholecystitis 08/24/2018  ? Arthritis   ? DVT of lower extremity (deep venous thrombosis) ([place])   ? right leg - after back surgery in 2019  ? GERD (gastroesophageal reflux disease)   ? History of blood transfusion   ? Hypothyroidism   ? PONV (postoperative nausea and vomiting)   ? Ptosis of both eyelids   ? Vertigo   ? Wears glasses   ? reading  ? ? ?Past Surgical History:  ?Procedure Laterality Date  ? ABDOMINAL HYSTERECTOMY  1986  ? APPLICATION OF ROBOTIC ASSISTANCE FOR SPINAL PROCEDURE N/A 07/17/2017  ? Procedure: APPLICATION OF ROBOTIC ASSISTANCE FOR SPINAL PROCEDURE;  Surgeon: Kristeen Miss, MD;  Location: [place];  Service: Neurosurgery;  Laterality: N/A;  ? APPLICATION OF ROBOTIC ASSISTANCE FOR SPINAL PROCEDURE N/A 09/01/2017  ? Procedure: APPLICATION OF ROBOTIC ASSISTANCE FOR SPINAL PROCEDURE;  Surgeon: Kristeen Miss, MD;  Location: [place];  Service: Neurosurgery;  Laterality: N/A;  ? APPLICATION OF ROBOTIC ASSISTANCE FOR SPINAL PROCEDURE N/A 10/27/2020  ? Procedure: APPLICATION OF ROBOTIC ASSISTANCE FOR SPINAL PROCEDURE;  Surgeon: Kristeen Miss, MD;  Location: [place];  Service: Neurosurgery;  Laterality: N/A;  ? [place]  ? lumb lam  ? CARPAL TUNNEL RELEASE  2000  ? right  ? CARPAL  TUNNEL RELEASE  2012  ? left  ? CHOLECYSTECTOMY N/A 08/24/2018  ? Procedure: LAPAROSCOPIC CHOLECYSTECTOMY WITH INTRAOPERATIVE CHOLANGIOGRAM;  Surgeon: Fanny Skates, MD;  Location: [place];  Service: General;  Laterality: N/A;  ? COLONOSCOPY    ? CYSTOCELE REPAIR  2007  ? sling  ? DISTAL INTERPHALANGEAL JOINT FUSION Right 02/04/2014  ? Procedure: FUSION DISTAL INTERPHALANGEAL JOINT RIGHT INDEX FINGER ;  Surgeon: Daryll Brod, MD;  Location: [place];  Service: Orthopedics;  Laterality: Right;  ? EYE SURGERY Bilateral   ? Cataract surgery with lens implant  ? KNEE SURGERY  2009,2011  ? partial knee  ? LAPAROSCOPY N/A 08/24/2018  ? Procedure: LAPAROSCOPY DIAGNOSTIC;  Surgeon: Fanny Skates, MD;  Location: [place];  Service: General;  Laterality: N/A;  ? LUMBAR PERCUTANEOUS PEDICLE SCREW 4 LEVEL N/A 10/27/2020  ? Procedure: Thoracic nine-ten Laminectomy with extension of fusion from Thoracic ten to Thoracic six with segmental fixation (cemented) with pedicle screw and mazor;  Surgeon: Kristeen Miss, MD;  Location: [place];  Service: Neurosurgery;  Laterality: N/A;  ? OPEN REDUCTION INTERNAL FIXATION (ORIF) PROXIMAL PHALANX Left 08/30/2013  ? Procedure: OPEN REDUCTION INTERNAL FIXATION (ORIF) PROXIMAL PHALANX FRACTURE LEFT SMALL FINGER; SPLINT RING FINGER;  Surgeon: Wynonia Sours, MD;  Location: [place];  Service: Orthopedics;  Laterality: Left;  ? POSTERIOR LUMBAR FUSION 4 LEVEL N/A  07/17/2017  ? Procedure: Thoracic Ten - Lumbar Five revison of hardware with Mazor;  Surgeon: Kristeen Miss, MD;  Location: [place];  Service: Neurosurgery;  Laterality: N/A;  Thoracic/Lumbar  ? PTOSIS REPAIR Bilateral 07/27/2015  ? Procedure: PTOSIS REPAIR;  Surgeon: Cristine Polio, MD;  Location: [place];  Service: Plastics;  Laterality: Bilateral;  ? SHOULDER SURGERY    ? rt rcr,and lt  ? TENOLYSIS Left 05/21/2021  ? Procedure: RELEASE EXTENSOR POLLICIS LONGUS LEFT;  Surgeon: Daryll Brod, MD;   Location: [place];  Service: Orthopedics;  Laterality: Left;  ? TENOLYSIS Left 06/08/2021  ? Procedure: RELEASE EXTENSOR POLLICIS LONGUS LEFT;  Surgeon: Daryll Brod, MD;  Location: [place];  Service: Orthopedics;  Laterality: Left;  ? TONSILLECTOMY    ? TOTAL KNEE ARTHROPLASTY Right 12/23/2019  ? Procedure: TOTAL KNEE ARTHROPLASTY;  Surgeon: Vickey Huger, MD;  [facility];  Service: Orthopedics;  Laterality: Right;  ? TRIGGER FINGER RELEASE Left 05/21/2021  ? Procedure: RELEASE TRIGGER FINGER/A-1 PULLEY LEFT MIDDLE FINGER;  Surgeon: Daryll Brod, MD;  Location: [place];  Service: Orthopedics;  Laterality: Left;  ? TRIGGER FINGER RELEASE Left 06/08/2021  ? Procedure: RELEASE A-1 PULLEY LEFT MIDDLE FINGER;  Surgeon: Daryll Brod, MD;  Location: [place];  Service: Orthopedics;  Laterality: Left;  ? ? ?There were no vitals filed for this visit. ? ? Subjective Assessment - 07/15/21 1619   ? ? Subjective Reports that she gets really tired after the PT visits, "I feel like I have a few steps backward"   ? Currently in Pain? No/denies   ? ?  ?  ? ?  ? ? ? ? ? ? ? ? ? ? ? ? ? ? ? ? ? ? ? ? [place] Adult PT Treatment/Exercise - 07/15/21 0001   ? ?  ? Ambulation/Gait  ? Gait Comments with 2 SPC's outside down slope, up and down curbs   ?  ? High Level Balance  ? High Level Balance Activities Side stepping;Backward walking;Marching forwards;Marching backwards   ? High Level Balance Comments tried her stepping up and onto then off of a 2' and 4" step, airex marching in Pbars, airex balance in p bars   ?  ? Knee/Hip Exercises: Stretches  ? Gastroc Stretch Both;3 reps;20 seconds   ?  ? Knee/Hip Exercises: Aerobic  ? Recumbent Bike level 3.5 x 5 minutes   ? Nustep level 5 x 5 minutes   ?  ? Knee/Hip Exercises: Standing  ? Walking with Sports Cord 20# fwd and backward   ? ?  ?  ? ?  ? ? ? ? ? ? ? ? ? ? ? ? PT Short Term Goals - 04/15/21 1638   ? ?  ? PT SHORT TERM  GOAL #1  ? Title independent with initial HEP   ? Status Achieved   ? ?  ?  ? ?  ? ? ? ? PT Long Term Goals - 07/15/21 1712   ? ?  ? PT LONG TERM GOAL #1  ? Title decrease pain 50%   ? Status Achieved   ?  ? PT LONG TERM GOAL #2  ? Title increase lumbar ROM 25% with less pain   ? Status Partially Met   ?  ? PT LONG TERM GOAL #3  ? Title increase LE strength to 4+/5   ? Status Partially Met   ? ?  ?  ? ?  ? ? ? ? ? ? ? ?  Plan - 07/15/21 1711   ? ? Clinical Impression Statement Patient [place] really had an issue that she froze when trying to step off a curb or step up on a curb.  She reports that she needed help, I tried to have her do a 2" step and a 4" step and she really struggles mentally with this and feeling like she can do it.  She was able to do it with two canes and with a SBA.  She does report fatigue with exercises   ? PT Next Visit Plan continue to work on balance and strength   ? Consulted and Agree with Plan of Care Patient   ? ?  ?  ? ?  ? ? ?Patient will benefit from skilled therapeutic intervention in order to improve the following deficits and impairments:  Abnormal gait, Decreased range of motion, Decreased balance, Decreased scar mobility, Impaired flexibility, Decreased strength, Decreased mobility, Decreased endurance, Pain, Cardiopulmonary status limiting activity, Decreased activity tolerance, Postural dysfunction, Improper body mechanics, Increased muscle spasms, Difficulty walking ? ?Visit Diagnosis: ?Acute bilateral low back pain without sciatica ? ?Muscle weakness (generalized) ? ?Difficulty in walking, not elsewhere classified ? ?Muscle spasm of back ? ?Pain in thoracic spine ? ? ? ? ?Problem List ?Patient Active Problem List  ? Diagnosis Date Noted  ? Myelopathy concurrent with and due to spinal stenosis of thoracic region [facility]) 10/27/2020  ? S/P total knee arthroplasty, right 12/23/2019  ? Acalculous cholecystitis 08/24/2018  ? Osteoarthritis of right  knee   ? Vertigo   ? DDD (degenerative disc disease), cervical   ? Benign essential HTN   ? History of DVT (deep vein thrombosis)   ? Tachycardia   ? Steroid-induced hyperglycemia   ? Hypothyroidism   ? Ne

## 2021-07-19 ENCOUNTER — Ambulatory Visit: Payer: Medicare Other | Admitting: Physical Therapy

## 2021-07-22 ENCOUNTER — Ambulatory Visit: Payer: Medicare Other | Admitting: Physical Therapy

## 2021-07-27 ENCOUNTER — Encounter: Payer: Self-pay | Admitting: Physical Therapy

## 2021-07-27 ENCOUNTER — Ambulatory Visit: Payer: Medicare Other | Admitting: Physical Therapy

## 2021-07-27 DIAGNOSIS — M545 Low back pain, unspecified: Secondary | ICD-10-CM

## 2021-07-27 DIAGNOSIS — R262 Difficulty in walking, not elsewhere classified: Secondary | ICD-10-CM

## 2021-07-27 DIAGNOSIS — M62830 Muscle spasm of back: Secondary | ICD-10-CM

## 2021-07-27 DIAGNOSIS — M546 Pain in thoracic spine: Secondary | ICD-10-CM

## 2021-07-27 DIAGNOSIS — M6281 Muscle weakness (generalized): Secondary | ICD-10-CM

## 2021-07-27 NOTE — Therapy (Signed)
[place] ?[place] [address] ?[place], [place], [address] ?Phone: [phone]   Fax:  [phone] ? ?Physical Therapy Treatment ? ?Patient Details  ?Name: [name] ?MRN: [id number] ?Date of Birth: 09-14-1932 ?Referring Provider (PT): Elsner ? ? ?Encounter Date: 07/27/2021 ? ? PT End of Session - 07/27/21 1638   ? ? Visit Number 61   ? Date for PT Re-Evaluation 08/26/21   ? Authorization Type UHC medicare   ? PT Start Time 3559   ? PT Stop Time 7416   ? PT Time Calculation (min) 52 min   ? Activity Tolerance Patient limited by pain   ? Behavior During Therapy [facility] for tasks assessed/performed   ? ?  ?  ? ?  ? ? ?Past Medical History:  ?Diagnosis Date  ? Acalculous cholecystitis 08/24/2018  ? Arthritis   ? DVT of lower extremity (deep venous thrombosis) ([place])   ? right leg - after back surgery in 2019  ? GERD (gastroesophageal reflux disease)   ? History of blood transfusion   ? Hypothyroidism   ? PONV (postoperative nausea and vomiting)   ? Ptosis of both eyelids   ? Vertigo   ? Wears glasses   ? reading  ? ? ?Past Surgical History:  ?Procedure Laterality Date  ? ABDOMINAL HYSTERECTOMY  1986  ? APPLICATION OF ROBOTIC ASSISTANCE FOR SPINAL PROCEDURE N/A 07/17/2017  ? Procedure: APPLICATION OF ROBOTIC ASSISTANCE FOR SPINAL PROCEDURE;  Surgeon: Kristeen Miss, MD;  Location: [place];  Service: Neurosurgery;  Laterality: N/A;  ? APPLICATION OF ROBOTIC ASSISTANCE FOR SPINAL PROCEDURE N/A 09/01/2017  ? Procedure: APPLICATION OF ROBOTIC ASSISTANCE FOR SPINAL PROCEDURE;  Surgeon: Kristeen Miss, MD;  Location: [place];  Service: Neurosurgery;  Laterality: N/A;  ? APPLICATION OF ROBOTIC ASSISTANCE FOR SPINAL PROCEDURE N/A 10/27/2020  ? Procedure: APPLICATION OF ROBOTIC ASSISTANCE FOR SPINAL PROCEDURE;  Surgeon: Kristeen Miss, MD;  Location: [place];  Service: Neurosurgery;  Laterality: N/A;  ? [place]  ? lumb lam  ? CARPAL TUNNEL RELEASE  2000  ? right  ? CARPAL TUNNEL  RELEASE  2012  ? left  ? CHOLECYSTECTOMY N/A 08/24/2018  ? Procedure: LAPAROSCOPIC CHOLECYSTECTOMY WITH INTRAOPERATIVE CHOLANGIOGRAM;  Surgeon: Fanny Skates, MD;  Location: [place];  Service: General;  Laterality: N/A;  ? COLONOSCOPY    ? CYSTOCELE REPAIR  2007  ? sling  ? DISTAL INTERPHALANGEAL JOINT FUSION Right 02/04/2014  ? Procedure: FUSION DISTAL INTERPHALANGEAL JOINT RIGHT INDEX FINGER ;  Surgeon: Daryll Brod, MD;  Location: [place];  Service: Orthopedics;  Laterality: Right;  ? EYE SURGERY Bilateral   ? Cataract surgery with lens implant  ? KNEE SURGERY  2009,2011  ? partial knee  ? LAPAROSCOPY N/A 08/24/2018  ? Procedure: LAPAROSCOPY DIAGNOSTIC;  Surgeon: Fanny Skates, MD;  Location: [place];  Service: General;  Laterality: N/A;  ? LUMBAR PERCUTANEOUS PEDICLE SCREW 4 LEVEL N/A 10/27/2020  ? Procedure: Thoracic nine-ten Laminectomy with extension of fusion from Thoracic ten to Thoracic six with segmental fixation (cemented) with pedicle screw and mazor;  Surgeon: Kristeen Miss, MD;  Location: [place];  Service: Neurosurgery;  Laterality: N/A;  ? OPEN REDUCTION INTERNAL FIXATION (ORIF) PROXIMAL PHALANX Left 08/30/2013  ? Procedure: OPEN REDUCTION INTERNAL FIXATION (ORIF) PROXIMAL PHALANX FRACTURE LEFT SMALL FINGER; SPLINT RING FINGER;  Surgeon: Wynonia Sours, MD;  Location: [place];  Service: Orthopedics;  Laterality: Left;  ? POSTERIOR LUMBAR FUSION 4 LEVEL N/A  07/17/2017  ? Procedure: Thoracic Ten - Lumbar Five revison of hardware with Mazor;  Surgeon: Kristeen Miss, MD;  Location: [place];  Service: Neurosurgery;  Laterality: N/A;  Thoracic/Lumbar  ? PTOSIS REPAIR Bilateral 07/27/2015  ? Procedure: PTOSIS REPAIR;  Surgeon: Cristine Polio, MD;  Location: [place];  Service: Plastics;  Laterality: Bilateral;  ? SHOULDER SURGERY    ? rt rcr,and lt  ? TENOLYSIS Left 05/21/2021  ? Procedure: RELEASE EXTENSOR POLLICIS LONGUS LEFT;  Surgeon: Daryll Brod, MD;  Location:  [place];  Service: Orthopedics;  Laterality: Left;  ? TENOLYSIS Left 06/08/2021  ? Procedure: RELEASE EXTENSOR POLLICIS LONGUS LEFT;  Surgeon: Daryll Brod, MD;  Location: [place];  Service: Orthopedics;  Laterality: Left;  ? TONSILLECTOMY    ? TOTAL KNEE ARTHROPLASTY Right 12/23/2019  ? Procedure: TOTAL KNEE ARTHROPLASTY;  Surgeon: Vickey Huger, MD;  [facility];  Service: Orthopedics;  Laterality: Right;  ? TRIGGER FINGER RELEASE Left 05/21/2021  ? Procedure: RELEASE TRIGGER FINGER/A-1 PULLEY LEFT MIDDLE FINGER;  Surgeon: Daryll Brod, MD;  Location: [place];  Service: Orthopedics;  Laterality: Left;  ? TRIGGER FINGER RELEASE Left 06/08/2021  ? Procedure: RELEASE A-1 PULLEY LEFT MIDDLE FINGER;  Surgeon: Daryll Brod, MD;  Location: [place];  Service: Orthopedics;  Laterality: Left;  ? ? ?There were no vitals filed for this visit. ? ? Subjective Assessment - 07/27/21 1611   ? ? Subjective Patient reports that on Thursday night she had sudden pain in the right hip buttock   ? Currently in Pain? Yes   ? Pain Score 7    ? Pain Location Hip   ? Pain Orientation Right   ? Pain Descriptors / Indicators Sore   ? Aggravating Factors  just really hurts   ? ?  ?  ? ?  ? ? ? ? ? ? ? ? ? ? ? ? ? ? ? ? ? ? ? ? [place] Adult PT Treatment/Exercise - 07/27/21 0001   ? ?  ? Knee/Hip Exercises: Stretches  ? Passive Hamstring Stretch Right;Left;3 reps;20 seconds   ? Piriformis Stretch 3 reps;Both;20 seconds   ?  ? Knee/Hip Exercises: Aerobic  ? Nustep level 5 x 5 minutes   ?  ? Modalities  ? Modalities Moist Heat   ?  ? Moist Heat Therapy  ? Number Minutes Moist Heat 10 Minutes   ? Moist Heat Location Hip;Lumbar Spine   ?  ? Electrical Stimulation  ? Electrical Stimulation Location right buttock   ? Electrical Stimulation Action IFC   ? Electrical Stimulation Parameters left side lying   ? Electrical Stimulation Goals Pain   ?  ? Manual Therapy  ? Manual Therapy Soft  tissue mobilization   ? Soft tissue mobilization to the right buttock and the low back and into the right ITB   ? ?  ?  ? ?  ? ? ? ? ? ? ? ? ? ? ? ? PT Short Term Goals - 04/15/21 1638   ? ?  ? PT SHORT TERM GOAL #1  ? Title independent with initial HEP   ? Status Achieved   ? ?  ?  ? ?  ? ? ? ? PT Long Term Goals - 07/27/21 1641   ? ?  ? PT LONG TERM GOAL #1  ? Title decrease pain 50%   ? Status Partially Met   ?  ? PT LONG TERM  GOAL #2  ? Title increase lumbar ROM 25% with less pain   ? Status Partially Met   ?  ? PT LONG TERM GOAL #3  ? Title increase LE strength to 4+/5   ? Status Partially Met   ?  ? PT LONG TERM GOAL #4  ? Title go up and down stairs step over step   ? Status Partially Met   ?  ? PT LONG TERM GOAL #5  ? Title decrease TUG to 18 seconds   ? Status Partially Met   ? ?  ?  ? ?  ? ? ? ? ? ? ? ? Plan - 07/27/21 1638   ? ? Clinical Impression Statement Patient came in with obvious limp on the right, she is struggling to walk, she is unsure of a specific cause, reports that last Thursday she felt like the mms "popped or rolled" and she went down on the couch.  She reports that she is still in pain and used heat and rested all weekend, she reports that Dr. Mancel Bale has an x-ray set up for tomorrow, we were looking to discharge this week, may need to see what the results are so I will renew to cover the next few visits and or weeks   ? PT Frequency 2x / week   ? PT Duration 4 weeks   ? PT Treatment/Interventions ADLs/Self Care Home Management;Cryotherapy;Gait training;Neuromuscular re-education;Balance training;Therapeutic exercise;Therapeutic activities;Functional mobility training;Stair training;Patient/family education;Manual techniques;Ultrasound;Moist Heat;Electrical Stimulation   ? PT Next Visit Plan look to d/c or see what new x-rays show   ? Consulted and Agree with Plan of Care Patient   ? ?  ?  ? ?  ? ? ?Patient will benefit from skilled therapeutic intervention in order to improve the  following deficits and impairments:  Abnormal gait, Decreased range of motion, Decreased balance, Decreased scar mobility, Impaired flexibility, Decreased strength, Decreased mobility, Decreased endurance, Pain,

## 2021-07-28 ENCOUNTER — Ambulatory Visit
Admission: RE | Admit: 2021-07-28 | Discharge: 2021-07-28 | Disposition: A | Discharge status: Home / Self Care | Payer: Medicare Other | Source: Ambulatory Visit | Attending: Internal Medicine | Admitting: Internal Medicine

## 2021-07-28 ENCOUNTER — Other Ambulatory Visit: Payer: Self-pay | Admitting: Internal Medicine

## 2021-07-28 DIAGNOSIS — M25551 Pain in right hip: Secondary | ICD-10-CM

## 2021-07-29 ENCOUNTER — Encounter: Payer: Self-pay | Admitting: Physical Therapy

## 2021-07-29 ENCOUNTER — Ambulatory Visit: Payer: Medicare Other | Admitting: Physical Therapy

## 2021-07-29 DIAGNOSIS — M62830 Muscle spasm of back: Secondary | ICD-10-CM

## 2021-07-29 DIAGNOSIS — M6281 Muscle weakness (generalized): Secondary | ICD-10-CM

## 2021-07-29 DIAGNOSIS — M545 Low back pain, unspecified: Secondary | ICD-10-CM

## 2021-07-29 DIAGNOSIS — R262 Difficulty in walking, not elsewhere classified: Secondary | ICD-10-CM

## 2021-07-29 DIAGNOSIS — M546 Pain in thoracic spine: Secondary | ICD-10-CM

## 2021-07-29 NOTE — Therapy (Signed)
[facility] [address] ?[place], [place], [address] ?Phone: [phone]   Fax:  [phone] ? ?Physical Therapy Treatment ? ?Patient Details  ?Name: [name] ?MRN: [id number] ?Date of Birth: 08-11-1932 ?Referring Provider (PT): Elsner ? ? ?Encounter Date: 07/29/2021 ? ? PT End of Session - 07/29/21 1640   ? ? Visit Number 38   ? Date for PT Re-Evaluation 08/26/21   ? Authorization Type [employer] medicare   ? PT Start Time 6759   ? PT Stop Time 1638   ? PT Time Calculation (min) 41 min   ? Activity Tolerance Patient limited by pain   ? Behavior During Therapy [facility] for tasks assessed/performed   ? ?  ?  ? ?  ? ? ?Past Medical History:  ?Diagnosis Date  ? Acalculous cholecystitis 08/24/2018  ? Arthritis   ? DVT of lower extremity (deep venous thrombosis) ([place])   ? right leg - after back surgery in 2019  ? GERD (gastroesophageal reflux disease)   ? History of blood transfusion   ? Hypothyroidism   ? PONV (postoperative nausea and vomiting)   ? Ptosis of both eyelids   ? Vertigo   ? Wears glasses   ? reading  ? ? ?Past Surgical History:  ?Procedure Laterality Date  ? ABDOMINAL HYSTERECTOMY  1986  ? APPLICATION OF ROBOTIC ASSISTANCE FOR SPINAL PROCEDURE N/A 07/17/2017  ? Procedure: APPLICATION OF ROBOTIC ASSISTANCE FOR SPINAL PROCEDURE;  Surgeon: Kristeen Miss, MD;  Location: [place];  Service: Neurosurgery;  Laterality: N/A;  ? APPLICATION OF ROBOTIC ASSISTANCE FOR SPINAL PROCEDURE N/A 09/01/2017  ? Procedure: APPLICATION OF ROBOTIC ASSISTANCE FOR SPINAL PROCEDURE;  Surgeon: Kristeen Miss, MD;  Location: [place];  Service: Neurosurgery;  Laterality: N/A;  ? APPLICATION OF ROBOTIC ASSISTANCE FOR SPINAL PROCEDURE N/A 10/27/2020  ? Procedure: APPLICATION OF ROBOTIC ASSISTANCE FOR SPINAL PROCEDURE;  Surgeon: Kristeen Miss, MD;  Location: [place];  Service: Neurosurgery;  Laterality: N/A;  ? [place]  ? lumb lam  ? CARPAL TUNNEL RELEASE  2000  ? right  ? CARPAL TUNNEL  RELEASE  2012  ? left  ? CHOLECYSTECTOMY N/A 08/24/2018  ? Procedure: LAPAROSCOPIC CHOLECYSTECTOMY WITH INTRAOPERATIVE CHOLANGIOGRAM;  Surgeon: Fanny Skates, MD;  Location: [place];  Service: General;  Laterality: N/A;  ? COLONOSCOPY    ? CYSTOCELE REPAIR  2007  ? sling  ? DISTAL INTERPHALANGEAL JOINT FUSION Right 02/04/2014  ? Procedure: FUSION DISTAL INTERPHALANGEAL JOINT RIGHT INDEX FINGER ;  Surgeon: Daryll Brod, MD;  Location: [place];  Service: Orthopedics;  Laterality: Right;  ? EYE SURGERY Bilateral   ? Cataract surgery with lens implant  ? KNEE SURGERY  2009,2011  ? partial knee  ? LAPAROSCOPY N/A 08/24/2018  ? Procedure: LAPAROSCOPY DIAGNOSTIC;  Surgeon: Fanny Skates, MD;  Location: [place];  Service: General;  Laterality: N/A;  ? LUMBAR PERCUTANEOUS PEDICLE SCREW 4 LEVEL N/A 10/27/2020  ? Procedure: Thoracic nine-ten Laminectomy with extension of fusion from Thoracic ten to Thoracic six with segmental fixation (cemented) with pedicle screw and mazor;  Surgeon: Kristeen Miss, MD;  Location: [place];  Service: Neurosurgery;  Laterality: N/A;  ? OPEN REDUCTION INTERNAL FIXATION (ORIF) PROXIMAL PHALANX Left 08/30/2013  ? Procedure: OPEN REDUCTION INTERNAL FIXATION (ORIF) PROXIMAL PHALANX FRACTURE LEFT SMALL FINGER; SPLINT RING FINGER;  Surgeon: Wynonia Sours, MD;  Location: [place];  Service: Orthopedics;  Laterality: Left;  ? POSTERIOR LUMBAR FUSION 4 LEVEL N/A  07/17/2017  ? Procedure: Thoracic Ten - Lumbar Five revison of hardware with Mazor;  Surgeon: Kristeen Miss, MD;  Location: [place];  Service: Neurosurgery;  Laterality: N/A;  Thoracic/Lumbar  ? PTOSIS REPAIR Bilateral 07/27/2015  ? Procedure: PTOSIS REPAIR;  Surgeon: Cristine Polio, MD;  Location: [place];  Service: Plastics;  Laterality: Bilateral;  ? SHOULDER SURGERY    ? rt rcr,and lt  ? TENOLYSIS Left 05/21/2021  ? Procedure: RELEASE EXTENSOR POLLICIS LONGUS LEFT;  Surgeon: Daryll Brod, MD;  Location:  [place];  Service: Orthopedics;  Laterality: Left;  ? TENOLYSIS Left 06/08/2021  ? Procedure: RELEASE EXTENSOR POLLICIS LONGUS LEFT;  Surgeon: Daryll Brod, MD;  Location: [place];  Service: Orthopedics;  Laterality: Left;  ? TONSILLECTOMY    ? TOTAL KNEE ARTHROPLASTY Right 12/23/2019  ? Procedure: TOTAL KNEE ARTHROPLASTY;  Surgeon: Vickey Huger, MD;  Location: [facility];  Service: Orthopedics;  Laterality: Right;  ? TRIGGER FINGER RELEASE Left 05/21/2021  ? Procedure: RELEASE TRIGGER FINGER/A-1 PULLEY LEFT MIDDLE FINGER;  Surgeon: Daryll Brod, MD;  Location: [place];  Service: Orthopedics;  Laterality: Left;  ? TRIGGER FINGER RELEASE Left 06/08/2021  ? Procedure: RELEASE A-1 PULLEY LEFT MIDDLE FINGER;  Surgeon: Daryll Brod, MD;  Location: [place];  Service: Orthopedics;  Laterality: Left;  ? ? ?There were no vitals filed for this visit. ? ? Subjective Assessment - 07/29/21 1612   ? ? Subjective Patient reports that she is still hurting in the right hip area, she had x-rays that were negative except degenerative changes in the right hip   ? Currently in Pain? Yes   ? Pain Score 7    ? Pain Location Hip   ? Pain Orientation Right   ? Pain Descriptors / Indicators Sore;Tightness   ? Aggravating Factors  just still hurts   ? Pain Relieving Factors I felt good for a few hours   ? ?  ?  ? ?  ? ? ? ? ? ? ? ? ? ? ? ? ? ? ? ? ? ? ? ? [facility] Adult PT Treatment/Exercise - 07/29/21 0001   ? ?  ? Knee/Hip Exercises: Stretches  ? Passive Hamstring Stretch Right;Left;3 reps;20 seconds   ? Piriformis Stretch 3 reps;Both;20 seconds   ?  ? Knee/Hip Exercises: Aerobic  ? Recumbent Bike level 2 x 5 minutes   ? Nustep level 5 x 5 minutes   ?  ? Moist Heat Therapy  ? Number Minutes Moist Heat 15 Minutes   ? Moist Heat Location Hip;Lumbar Spine   ?  ? Electrical Stimulation  ? Electrical Stimulation Location right buttock   ? Electrical Stimulation Action IFC   ? Electrical  Stimulation Parameters sitting   ? Electrical Stimulation Goals Pain   ?  ? Manual Therapy  ? Manual Therapy Soft tissue mobilization   ? Soft tissue mobilization to the right buttock and the low back and into the right ITB   ? ?  ?  ? ?  ? ? ? ? ? ? ? ? ? ? ? ? PT Short Term Goals - 04/15/21 1638   ? ?  ? PT SHORT TERM GOAL #1  ? Title independent with initial HEP   ? Status Achieved   ? ?  ?  ? ?  ? ? ? ? PT Long Term Goals - 07/29/21 1644   ? ?  ? PT LONG TERM GOAL #1  ?  Title decrease pain 50%   ? Status Partially Met   ?  ? PT LONG TERM GOAL #2  ? Title increase lumbar ROM 25% with less pain   ? Status Partially Met   ? ?  ?  ? ?  ? ? ? ? ? ? ? ? Plan - 07/29/21 1640   ? ? Clinical Impression Statement Patient continues to be very sore and painful in the right hip, she is very tender in this area, she is limping more, the x-ray was negative except for degenerative changes.  She reports that she will get a call from the doctor on Monday, we will decide what we need to do to continue   ? PT Next Visit Plan wait until the MD gives her idea of what is going on, see if we continue or d/c   ? Consulted and Agree with Plan of Care Patient   ? ?  ?  ? ?  ? ? ?Patient will benefit from skilled therapeutic intervention in order to improve the following deficits and impairments:  Abnormal gait, Decreased range of motion, Decreased balance, Decreased scar mobility, Impaired flexibility, Decreased strength, Decreased mobility, Decreased endurance, Pain, Cardiopulmonary status limiting activity, Decreased activity tolerance, Postural dysfunction, Improper body mechanics, Increased muscle spasms, Difficulty walking ? ?Visit Diagnosis: ?Acute bilateral low back pain without sciatica ? ?Muscle weakness (generalized) ? ?Difficulty in walking, not elsewhere classified ? ?Muscle spasm of back ? ?Pain in thoracic spine ? ? ? ? ?Problem List ?Patient Active Problem List  ? Diagnosis Date Noted  ? Myelopathy concurrent with and due  to spinal stenosis of thoracic region [facility]) 10/27/2020  ? S/P total knee arthroplasty, right 12/23/2019  ? Acalculous cholecystitis 08/24/2018  ? Osteoarthritis of right knee   ? Vertigo   ? DDD (degenerativ

## 2021-08-19 ENCOUNTER — Ambulatory Visit: Payer: Medicare Other | Admitting: Physical Therapy

## 2021-08-24 ENCOUNTER — Encounter: Payer: Self-pay | Admitting: Physical Therapy

## 2021-08-24 ENCOUNTER — Ambulatory Visit: Payer: Medicare Other | Attending: Neurological Surgery | Admitting: Physical Therapy

## 2021-08-24 DIAGNOSIS — M545 Low back pain, unspecified: Secondary | ICD-10-CM | POA: Insufficient documentation

## 2021-08-24 DIAGNOSIS — R262 Difficulty in walking, not elsewhere classified: Secondary | ICD-10-CM | POA: Insufficient documentation

## 2021-08-24 DIAGNOSIS — M6281 Muscle weakness (generalized): Secondary | ICD-10-CM | POA: Diagnosis present

## 2021-08-24 NOTE — Therapy (Signed)
[place]. [place], [place], [address] Phone: [phone]   Fax:  [phone]  Physical Therapy Treatment  Patient Details  Name: [name] MRN: [id number] Date of Birth: Jun 12, 1932 Referring Provider (PT): Elsner   Encounter Date: 08/24/2021   PT End of Session - 08/24/21 1644     Visit Number 50    Date for PT Re-Evaluation 08/26/21    Authorization Type [employer] medicare    PT Start Time 1600    PT Stop Time 1645    PT Time Calculation (min) 45 min    Activity Tolerance Patient tolerated treatment well    Behavior During Therapy [facility] for tasks assessed/performed             Past Medical History:  Diagnosis Date   Acalculous cholecystitis 08/24/2018   Arthritis    DVT of lower extremity (deep venous thrombosis) ([facility])    right leg - after back surgery in 2019   GERD (gastroesophageal reflux disease)    History of blood transfusion    Hypothyroidism    PONV (postoperative nausea and vomiting)    Ptosis of both eyelids    Vertigo    Wears glasses    reading    Past Surgical History:  Procedure Laterality Date   ABDOMINAL HYSTERECTOMY  9449   APPLICATION OF ROBOTIC ASSISTANCE FOR SPINAL PROCEDURE N/A 07/17/2017   Procedure: APPLICATION OF ROBOTIC ASSISTANCE FOR SPINAL PROCEDURE;  Surgeon: Kristeen Miss, MD;  Location: [place];  Service: Neurosurgery;  Laterality: N/A;   APPLICATION OF ROBOTIC ASSISTANCE FOR SPINAL PROCEDURE N/A 09/01/2017   Procedure: APPLICATION OF ROBOTIC ASSISTANCE FOR SPINAL PROCEDURE;  Surgeon: Kristeen Miss, MD;  Location: [place];  Service: Neurosurgery;  Laterality: N/A;   APPLICATION OF ROBOTIC ASSISTANCE FOR SPINAL PROCEDURE N/A 10/27/2020   Procedure: APPLICATION OF ROBOTIC ASSISTANCE FOR SPINAL PROCEDURE;  Surgeon: Kristeen Miss, MD;  Location: [place];  Service: Neurosurgery;  Laterality: N/A;   BACK SURGERY  1986   lumb lam   CARPAL TUNNEL RELEASE  2000   right   CARPAL  TUNNEL RELEASE  2012   left   CHOLECYSTECTOMY N/A 08/24/2018   Procedure: LAPAROSCOPIC CHOLECYSTECTOMY WITH INTRAOPERATIVE CHOLANGIOGRAM;  Surgeon: Fanny Skates, MD;  [facility];  Service: General;  Laterality: N/A;   COLONOSCOPY     CYSTOCELE REPAIR  2007   sling   DISTAL INTERPHALANGEAL JOINT FUSION Right 02/04/2014   Procedure: FUSION DISTAL INTERPHALANGEAL JOINT RIGHT INDEX FINGER ;  Surgeon: Daryll Brod, MD;  Location: [place];  Service: Orthopedics;  Laterality: Right;   EYE SURGERY Bilateral    Cataract surgery with lens implant   KNEE SURGERY  2009,2011   partial knee   LAPAROSCOPY N/A 08/24/2018   Procedure: LAPAROSCOPY DIAGNOSTIC;  Surgeon: Fanny Skates, MD;  Location: [place];  Service: General;  Laterality: N/A;   LUMBAR PERCUTANEOUS PEDICLE SCREW 4 LEVEL N/A 10/27/2020   Procedure: Thoracic nine-ten Laminectomy with extension of fusion from Thoracic ten to Thoracic six with segmental fixation (cemented) with pedicle screw and mazor;  Surgeon: Kristeen Miss, MD;  Location: [place];  Service: Neurosurgery;  Laterality: N/A;   OPEN REDUCTION INTERNAL FIXATION (ORIF) PROXIMAL PHALANX Left 08/30/2013   Procedure: OPEN REDUCTION INTERNAL FIXATION (ORIF) PROXIMAL PHALANX FRACTURE LEFT SMALL FINGER; SPLINT RING FINGER;  Surgeon: Wynonia Sours, MD;  Location: [place];  Service: Orthopedics;  Laterality: Left;   POSTERIOR LUMBAR FUSION 4 LEVEL N/A  07/17/2017   Procedure: Thoracic Ten - Lumbar Five revison of hardware with Mazor;  Surgeon: Kristeen Miss, MD;  Location: [place];  Service: Neurosurgery;  Laterality: N/A;  Thoracic/Lumbar   PTOSIS REPAIR Bilateral 07/27/2015   Procedure: PTOSIS REPAIR;  Surgeon: Cristine Polio, MD;  Location: [place];  Service: Plastics;  Laterality: Bilateral;   SHOULDER SURGERY     rt rcr,and lt   TENOLYSIS Left 05/21/2021   Procedure: RELEASE EXTENSOR POLLICIS LONGUS LEFT;  Surgeon: Daryll Brod, MD;   Location: [place];  Service: Orthopedics;  Laterality: Left;   TENOLYSIS Left 06/08/2021   Procedure: RELEASE EXTENSOR POLLICIS LONGUS LEFT;  Surgeon: Daryll Brod, MD;  Location: [place];  Service: Orthopedics;  Laterality: Left;   TONSILLECTOMY     TOTAL KNEE ARTHROPLASTY Right 12/23/2019   Procedure: TOTAL KNEE ARTHROPLASTY;  Surgeon: Vickey Huger, MD;  Location[facility];  Service: Orthopedics;  Laterality: Right;   TRIGGER FINGER RELEASE Left 05/21/2021   Procedure: RELEASE TRIGGER FINGER/A-1 PULLEY LEFT MIDDLE FINGER;  Surgeon: Daryll Brod, MD;  Location: [place];  Service: Orthopedics;  Laterality: Left;   TRIGGER FINGER RELEASE Left 06/08/2021   Procedure: RELEASE A-1 PULLEY LEFT MIDDLE FINGER;  Surgeon: Daryll Brod, MD;  Location: [place];  Service: Orthopedics;  Laterality: Left;    There were no vitals filed for this visit.   Subjective Assessment - 08/24/21 1558     Subjective Pt reports some sciatic nerve issues that started about three weeks ago. Pt received injection last week in her R arm but does not know what it was. Will go to the neurosurgeon tomorrow.    Currently in Pain? Yes    Pain Score 5     Pain Location Buttocks                               [facility] Adult PT Treatment/Exercise - 08/24/21 0001       Knee/Hip Exercises: Stretches   Passive Hamstring Stretch Right;Left;3 reps;20 seconds    Piriformis Stretch Both;20 seconds;4 reps;10 seconds    Other Knee/Hip Stretches Single K2C 2x 20''      Knee/Hip Exercises: Aerobic   Nustep level 5 x 5 minutes      Knee/Hip Exercises: Machines for Strengthening   Cybex Knee Extension 5# 3x10    Cybex Knee Flexion 25lb 3x10      Knee/Hip Exercises: Standing   Other Standing Knee Exercises Shoulder Rows & Ext blue 2x10 each    Other Standing Knee Exercises Alt box taps 6 in 2x10 with SPC      Knee/Hip Exercises: Seated   Sit to Sand  2 sets;10 reps;without UE support   slightle elevated mat     Knee/Hip Exercises: Supine   Bridges Strengthening;Left;Both                       PT Short Term Goals - 04/15/21 1638       PT SHORT TERM GOAL #1   Title independent with initial HEP    Status Achieved               PT Long Term Goals - 07/29/21 1644       PT LONG TERM GOAL #1   Title decrease pain 50%    Status Partially Met      PT LONG TERM GOAL #2  Title increase lumbar ROM 25% with less pain    Status Partially Met                   Plan - 08/24/21 1645     Clinical Impression Statement Pt enters reporting that she has been having some sciatic nerve pain on R side. Mild RLE pain reported during session. Despite reports pt did well completing all interventions. Postural cues required to aid in maintaining standing balance with rows and extensions. Cue for eccentric control needed with machine level interventions. CGA needed to complete alt box taps. Pt has very tight piriformis muscle on R side.    Stability/Clinical Decision Making Evolving/Moderate complexity    Rehab Potential Good    PT Frequency 2x / week    PT Duration 4 weeks    PT Treatment/Interventions ADLs/Self Care Home Management;Cryotherapy;Gait training;Neuromuscular re-education;Balance training;Therapeutic exercise;Therapeutic activities;Functional mobility training;Stair training;Patient/family education;Manual techniques;Ultrasound;Moist Heat;Electrical Stimulation    PT Next Visit Plan re cert. postural strength LE stretching.             Patient will benefit from skilled therapeutic intervention in order to improve the following deficits and impairments:  Abnormal gait, Decreased range of motion, Decreased balance, Decreased scar mobility, Impaired flexibility, Decreased strength, Decreased mobility, Decreased endurance, Pain, Cardiopulmonary status limiting activity, Decreased activity tolerance, Postural  dysfunction, Improper body mechanics, Increased muscle spasms, Difficulty walking  Visit Diagnosis: Acute bilateral low back pain without sciatica  Difficulty in walking, not elsewhere classified  Muscle weakness (generalized)     Problem List Patient Active Problem List   Diagnosis Date Noted   Myelopathy concurrent with and due to spinal stenosis of thoracic region ([place]) 10/27/2020   S/P total knee arthroplasty, right 12/23/2019   Acalculous cholecystitis 08/24/2018   Osteoarthritis of right knee    Vertigo    DDD (degenerative disc disease), cervical    Benign essential HTN    History of DVT (deep vein thrombosis)    Tachycardia    Steroid-induced hyperglycemia    Hypothyroidism    Neuropathic pain    Acute blood loss anemia    S/P lumbar fusion    Closed L5 vertebral fracture ([facility]) 08/31/2017   Closed fracture of fifth lumbar vertebra ([place]) 08/30/2017   Lumbar vertebral fracture ([place]) 07/17/2017   Scoliosis 06/13/2017    Scot Jun, PTA 08/24/2021, 4:48 PM  [place]. [place], [place], [address] Phone: [phone]   Fax:  [phone]  Name: [name] MRN: [id number] Date of Birth: [birth year]

## 2021-08-26 ENCOUNTER — Encounter: Payer: Self-pay | Admitting: Physical Therapy

## 2021-08-26 ENCOUNTER — Ambulatory Visit: Payer: Medicare Other | Admitting: Physical Therapy

## 2021-08-26 DIAGNOSIS — M545 Low back pain, unspecified: Secondary | ICD-10-CM | POA: Diagnosis not present

## 2021-08-26 DIAGNOSIS — R262 Difficulty in walking, not elsewhere classified: Secondary | ICD-10-CM

## 2021-08-26 DIAGNOSIS — M6281 Muscle weakness (generalized): Secondary | ICD-10-CM

## 2021-08-26 NOTE — Therapy (Signed)
[place]. [place], [place], [address] Phone: [phone]   Fax:  [phone] Progress Note Reporting Period 06/22/21 to 08/26/21 for vistis 31-40  See note below for Objective Data and Assessment of Progress/Goals.     Physical Therapy Treatment  Patient Details  Name: [name] MRN: [id number] Date of Birth: 04/21/1932 Referring Provider (PT): Elsner   Encounter Date: 08/26/2021   PT End of Session - 08/26/21 1556     Visit Number 40    Date for PT Re-Evaluation 08/26/21    Authorization Type UHC medicare    PT Start Time 0973    PT Stop Time 1600    PT Time Calculation (min) 45 min    Activity Tolerance Patient tolerated treatment well    Behavior During Therapy [employer] for tasks assessed/performed             Past Medical History:  Diagnosis Date   Acalculous cholecystitis 08/24/2018   Arthritis    DVT of lower extremity (deep venous thrombosis) (HCC)    right leg - after back surgery in 2019   GERD (gastroesophageal reflux disease)    History of blood transfusion    Hypothyroidism    PONV (postoperative nausea and vomiting)    Ptosis of both eyelids    Vertigo    Wears glasses    reading    Past Surgical History:  Procedure Laterality Date   ABDOMINAL HYSTERECTOMY  5329   APPLICATION OF ROBOTIC ASSISTANCE FOR SPINAL PROCEDURE N/A 07/17/2017   Procedure: APPLICATION OF ROBOTIC ASSISTANCE FOR SPINAL PROCEDURE;  Surgeon: Kristeen Miss, MD;  Location: [place];  Service: Neurosurgery;  Laterality: N/A;   APPLICATION OF ROBOTIC ASSISTANCE FOR SPINAL PROCEDURE N/A 09/01/2017   Procedure: APPLICATION OF ROBOTIC ASSISTANCE FOR SPINAL PROCEDURE;  Surgeon: Kristeen Miss, MD;  Location: [facility];  Service: Neurosurgery;  Laterality: N/A;   APPLICATION OF ROBOTIC ASSISTANCE FOR SPINAL PROCEDURE N/A 10/27/2020   Procedure: APPLICATION OF ROBOTIC ASSISTANCE FOR SPINAL PROCEDURE;  Surgeon: Kristeen Miss, MD;   Location: [facility];  Service: Neurosurgery;  Laterality: N/A;   BACK SURGERY  1986   lumb lam   CARPAL TUNNEL RELEASE  2000   right   CARPAL TUNNEL RELEASE  2012   left   CHOLECYSTECTOMY N/A 08/24/2018   Procedure: LAPAROSCOPIC CHOLECYSTECTOMY WITH INTRAOPERATIVE CHOLANGIOGRAM;  Surgeon: Fanny Skates, MD;  Location: [facility] OR;  Service: General;  Laterality: N/A;   COLONOSCOPY     CYSTOCELE REPAIR  2007   sling   DISTAL INTERPHALANGEAL JOINT FUSION Right 02/04/2014   Procedure: FUSION DISTAL INTERPHALANGEAL JOINT RIGHT INDEX FINGER ;  Surgeon: Daryll Brod, MD;  Location: [facility];  Service: Orthopedics;  Laterality: Right;   EYE SURGERY Bilateral    Cataract surgery with lens implant   KNEE SURGERY  2009,2011   partial knee   LAPAROSCOPY N/A 08/24/2018   Procedure: LAPAROSCOPY DIAGNOSTIC;  Surgeon: Fanny Skates, MD;  Location: [place];  Service: General;  Laterality: N/A;   LUMBAR PERCUTANEOUS PEDICLE SCREW 4 LEVEL N/A 10/27/2020   Procedure: Thoracic nine-ten Laminectomy with extension of fusion from Thoracic ten to Thoracic six with segmental fixation (cemented) with pedicle screw and mazor;  Surgeon: Kristeen Miss, MD;  Location: [facility];  Service: Neurosurgery;  Laterality: N/A;   OPEN REDUCTION INTERNAL FIXATION (ORIF) PROXIMAL PHALANX Left 08/30/2013   Procedure: OPEN REDUCTION INTERNAL FIXATION (ORIF) PROXIMAL PHALANX FRACTURE LEFT SMALL FINGER; SPLINT RING FINGER;  Surgeon:  Wynonia Sours, MD;  Location: [place];  Service: Orthopedics;  Laterality: Left;   POSTERIOR LUMBAR FUSION 4 LEVEL N/A 07/17/2017   Procedure: Thoracic Ten - Lumbar Five revison of hardware with Mazor;  Surgeon: Kristeen Miss, MD;  Location: [place];  Service: Neurosurgery;  Laterality: N/A;  Thoracic/Lumbar   PTOSIS REPAIR Bilateral 07/27/2015   Procedure: PTOSIS REPAIR;  Surgeon: Cristine Polio, MD;  Location: [place];  Service: Plastics;  Laterality: Bilateral;    SHOULDER SURGERY     rt rcr,and lt   TENOLYSIS Left 05/21/2021   Procedure: RELEASE EXTENSOR POLLICIS LONGUS LEFT;  Surgeon: Daryll Brod, MD;  Location: [place];  Service: Orthopedics;  Laterality: Left;   TENOLYSIS Left 06/08/2021   Procedure: RELEASE EXTENSOR POLLICIS LONGUS LEFT;  Surgeon: Daryll Brod, MD;  Location: [place];  Service: Orthopedics;  Laterality: Left;   TONSILLECTOMY     TOTAL KNEE ARTHROPLASTY Right 12/23/2019   Procedure: TOTAL KNEE ARTHROPLASTY;  Surgeon: Vickey Huger, MD;  Location[facility];  Service: Orthopedics;  Laterality: Right;   TRIGGER FINGER RELEASE Left 05/21/2021   Procedure: RELEASE TRIGGER FINGER/A-1 PULLEY LEFT MIDDLE FINGER;  Surgeon: Daryll Brod, MD;  Location: [facility];  Service: Orthopedics;  Laterality: Left;   TRIGGER FINGER RELEASE Left 06/08/2021   Procedure: RELEASE A-1 PULLEY LEFT MIDDLE FINGER;  Surgeon: Daryll Brod, MD;  Location: [place];  Service: Orthopedics;  Laterality: Left;    There were no vitals filed for this visit.   Subjective Assessment - 08/26/21 1519     Subjective I feel pretty good, Pt stated that she has a bursa sac problem, received injection yesterday in R hip    Currently in Pain? Yes    Pain Score 3     Pain Location Hip    Pain Orientation Right                [facility] PT Assessment - 08/26/21 0001       Strength   Right Knee Extension 4-/5    Left Knee Flexion 4-/5    Left Knee Extension 4-/5    Right Ankle Dorsiflexion 4-/5    Right Ankle Plantar Flexion 4-/5                           [facility] Adult PT Treatment/Exercise - 08/26/21 0001       Knee/Hip Exercises: Aerobic   Recumbent Bike level 2 x 5 minutes    Nustep level 5 x 5 minutes      Knee/Hip Exercises: Machines for Strengthening   Cybex Knee Extension 5# 2x15    Cybex Knee Flexion 25lb 2x15      Knee/Hip Exercises: Standing   Forward Step Up Left;2 sets;5  sets;Hand Hold: 0;Step Height: 4";Step Height: 6"   SPC   Walking with Sports Cord 20# fwd and backward x3, side step 10lb x2    Other Standing Knee Exercises Alt box taps 6 in 2x10 with SPC      Knee/Hip Exercises: Seated   Sit to Sand 2 sets;10 reps;without UE support                       PT Short Term Goals - 04/15/21 1638       PT SHORT TERM GOAL #1   Title independent with initial HEP    Status Achieved  PT Long Term Goals - 08/26/21 1556       PT LONG TERM GOAL #1   Title decrease pain 50%    Status Partially Met      PT LONG TERM GOAL #2   Title increase lumbar ROM 25% with less pain    Status Partially Met      PT LONG TERM GOAL #3   Title increase LE strength to 4+/5    Status Partially Met      PT LONG TERM GOAL #4   Title go up and down stairs step over step    Status Partially Met      PT LONG TERM GOAL #5   Title decrease TUG to 18 seconds    Status Partially Met                   Plan - 08/26/21 1558     Clinical Impression Statement Pt has progressed increasing the strength in her LE. Instability present with resisted gait moe so with side steps. CGA needed for alt box taps. Step up performed due to pt expressing that he has trouble with curbs. Cue snot to use rails and only SPC with step ups. Good control and  stability noted with sit to stands.    Stability/Clinical Decision Making Evolving/Moderate complexity    Rehab Potential Good    PT Frequency 2x / week    PT Duration 4 weeks    PT Treatment/Interventions ADLs/Self Care Home Management;Cryotherapy;Gait training;Neuromuscular re-education;Balance training;Therapeutic exercise;Therapeutic activities;Functional mobility training;Stair training;Patient/family education;Manual techniques;Ultrasound;Moist Heat;Electrical Stimulation    PT Next Visit Plan postural strength LE stretching. Possible D/C             Patient will benefit from skilled  therapeutic intervention in order to improve the following deficits and impairments:  Abnormal gait, Decreased range of motion, Decreased balance, Decreased scar mobility, Impaired flexibility, Decreased strength, Decreased mobility, Decreased endurance, Pain, Cardiopulmonary status limiting activity, Decreased activity tolerance, Postural dysfunction, Improper body mechanics, Increased muscle spasms, Difficulty walking  Visit Diagnosis: Acute bilateral low back pain without sciatica  Muscle weakness (generalized)  Difficulty in walking, not elsewhere classified     Problem List Patient Active Problem List   Diagnosis Date Noted   Myelopathy concurrent with and due to spinal stenosis of thoracic region ([place]) 10/27/2020   S/P total knee arthroplasty, right 12/23/2019   Acalculous cholecystitis 08/24/2018   Osteoarthritis of right knee    Vertigo    DDD (degenerative disc disease), cervical    Benign essential HTN    History of DVT (deep vein thrombosis)    Tachycardia    Steroid-induced hyperglycemia    Hypothyroidism    Neuropathic pain    Acute blood loss anemia    S/P lumbar fusion    Closed L5 vertebral fracture (HCC) 08/31/2017   Closed fracture of fifth lumbar vertebra ([place]) 08/30/2017   Lumbar vertebral fracture ([place]) 07/17/2017   Scoliosis 06/13/2017    Scot Jun, PTA 08/26/2021, 4:01 PM  [place]. [place], [place], [address] Phone: [phone]   Fax:  [phone]  Name: NEIMA LACROSS MRN: [id number] Date of Birth: 01/05/1933

## 2021-09-02 ENCOUNTER — Ambulatory Visit: Payer: Medicare Other | Attending: Neurological Surgery | Admitting: Physical Therapy

## 2021-09-02 ENCOUNTER — Encounter: Payer: Self-pay | Admitting: Physical Therapy

## 2021-09-02 DIAGNOSIS — M546 Pain in thoracic spine: Secondary | ICD-10-CM | POA: Insufficient documentation

## 2021-09-02 DIAGNOSIS — R262 Difficulty in walking, not elsewhere classified: Secondary | ICD-10-CM | POA: Insufficient documentation

## 2021-09-02 DIAGNOSIS — M545 Low back pain, unspecified: Secondary | ICD-10-CM | POA: Diagnosis present

## 2021-09-02 DIAGNOSIS — M6281 Muscle weakness (generalized): Secondary | ICD-10-CM | POA: Insufficient documentation

## 2021-09-02 DIAGNOSIS — M62830 Muscle spasm of back: Secondary | ICD-10-CM | POA: Diagnosis present

## 2021-09-02 NOTE — Patient Instructions (Signed)
Access Code: [email or web address] URL: [email or web address] Date: 09/02/2021 Prepared by: Lum Babe  Exercises - Seated Long Arc Quad with Ankle Weight  - 1 x daily - 7 x weekly - 3 sets - 10 reps - 3 hold - Standing Hip Abduction  - 1 x daily - 7 x weekly - 3 sets - 10 reps - 3 hold - Standing Marching  - 1 x daily - 7 x weekly - 3 sets - 10 reps - 3 hold - Standing Hip Extension with Counter Support  - 1 x daily - 7 x weekly - 3 sets - 10 reps - 3 hold - Seated Ankle Dorsiflexion AROM  - 1 x daily - 7 x weekly - 3 sets - 10 reps - 3 hold - Ankle Dorsiflexion with Resistance  - 1 x daily - 7 x weekly - 3 sets - 10 reps - 3 hold - Ankle and Toe Plantarflexion with Resistance  - 1 x daily - 7 x weekly - 3 sets - 10 reps - 3 hold

## 2021-09-02 NOTE — Therapy (Signed)
[place]. [place], [place], [address] Phone: [phone]   Fax:  [phone]  Physical Therapy Treatment  Patient Details  Name: [name] MRN: [id number] Date of Birth: 08/19/1932 Referring Provider (PT): Elsner   Encounter Date: 09/02/2021   PT End of Session - 09/02/21 1559     Visit Number 70    Authorization Type [employer] medicare    PT Start Time 5170    PT Stop Time 1600    PT Time Calculation (min) 45 min    Activity Tolerance Patient tolerated treatment well    Behavior During Therapy [facility] for tasks assessed/performed             Past Medical History:  Diagnosis Date   Acalculous cholecystitis 08/24/2018   Arthritis    DVT of lower extremity (deep venous thrombosis) (HCC)    right leg - after back surgery in 2019   GERD (gastroesophageal reflux disease)    History of blood transfusion    Hypothyroidism    PONV (postoperative nausea and vomiting)    Ptosis of both eyelids    Vertigo    Wears glasses    reading    Past Surgical History:  Procedure Laterality Date   ABDOMINAL HYSTERECTOMY  0174   APPLICATION OF ROBOTIC ASSISTANCE FOR SPINAL PROCEDURE N/A 07/17/2017   Procedure: APPLICATION OF ROBOTIC ASSISTANCE FOR SPINAL PROCEDURE;  Surgeon: Kristeen Miss, MD;  Location: [place];  Service: Neurosurgery;  Laterality: N/A;   APPLICATION OF ROBOTIC ASSISTANCE FOR SPINAL PROCEDURE N/A 09/01/2017   Procedure: APPLICATION OF ROBOTIC ASSISTANCE FOR SPINAL PROCEDURE;  Surgeon: Kristeen Miss, MD;  Location: [place];  Service: Neurosurgery;  Laterality: N/A;   APPLICATION OF ROBOTIC ASSISTANCE FOR SPINAL PROCEDURE N/A 10/27/2020   Procedure: APPLICATION OF ROBOTIC ASSISTANCE FOR SPINAL PROCEDURE;  Surgeon: Kristeen Miss, MD;  Location: [place];  Service: Neurosurgery;  Laterality: N/A;   BACK SURGERY  1986   lumb lam   CARPAL TUNNEL RELEASE  2000   right   CARPAL TUNNEL RELEASE  2012   left    CHOLECYSTECTOMY N/A 08/24/2018   Procedure: LAPAROSCOPIC CHOLECYSTECTOMY WITH INTRAOPERATIVE CHOLANGIOGRAM;  Surgeon: Fanny Skates, MD;  Location: [facility] OR;  Service: General;  Laterality: N/A;   COLONOSCOPY     CYSTOCELE REPAIR  2007   sling   DISTAL INTERPHALANGEAL JOINT FUSION Right 02/04/2014   Procedure: FUSION DISTAL INTERPHALANGEAL JOINT RIGHT INDEX FINGER ;  Surgeon: Daryll Brod, MD;  Location: [place];  Service: Orthopedics;  Laterality: Right;   EYE SURGERY Bilateral    Cataract surgery with lens implant   KNEE SURGERY  2009,2011   partial knee   LAPAROSCOPY N/A 08/24/2018   Procedure: LAPAROSCOPY DIAGNOSTIC;  Surgeon: Fanny Skates, MD;  Location: [place];  Service: General;  Laterality: N/A;   LUMBAR PERCUTANEOUS PEDICLE SCREW 4 LEVEL N/A 10/27/2020   Procedure: Thoracic nine-ten Laminectomy with extension of fusion from Thoracic ten to Thoracic six with segmental fixation (cemented) with pedicle screw and mazor;  Surgeon: Kristeen Miss, MD;  Location: [place];  Service: Neurosurgery;  Laterality: N/A;   OPEN REDUCTION INTERNAL FIXATION (ORIF) PROXIMAL PHALANX Left 08/30/2013   Procedure: OPEN REDUCTION INTERNAL FIXATION (ORIF) PROXIMAL PHALANX FRACTURE LEFT SMALL FINGER; SPLINT RING FINGER;  Surgeon: Wynonia Sours, MD;  Location: [facility];  Service: Orthopedics;  Laterality: Left;   POSTERIOR LUMBAR FUSION 4 LEVEL N/A 07/17/2017   Procedure: Thoracic Ten - Lumbar  Five revison of hardware with Mazor;  Surgeon: Kristeen Miss, MD;  Location: [place];  Service: Neurosurgery;  Laterality: N/A;  Thoracic/Lumbar   PTOSIS REPAIR Bilateral 07/27/2015   Procedure: PTOSIS REPAIR;  Surgeon: Cristine Polio, MD;  Location: [facility];  Service: Plastics;  Laterality: Bilateral;   SHOULDER SURGERY     rt rcr,and lt   TENOLYSIS Left 05/21/2021   Procedure: RELEASE EXTENSOR POLLICIS LONGUS LEFT;  Surgeon: Daryll Brod, MD;  Location: [place];   Service: Orthopedics;  Laterality: Left;   TENOLYSIS Left 06/08/2021   Procedure: RELEASE EXTENSOR POLLICIS LONGUS LEFT;  Surgeon: Daryll Brod, MD;  Location: [place];  Service: Orthopedics;  Laterality: Left;   TONSILLECTOMY     TOTAL KNEE ARTHROPLASTY Right 12/23/2019   Procedure: TOTAL KNEE ARTHROPLASTY;  Surgeon: Vickey Huger, MD;  Location[facility];  Service: Orthopedics;  Laterality: Right;   TRIGGER FINGER RELEASE Left 05/21/2021   Procedure: RELEASE TRIGGER FINGER/A-1 PULLEY LEFT MIDDLE FINGER;  Surgeon: Daryll Brod, MD;  Location: [place];  Service: Orthopedics;  Laterality: Left;   TRIGGER FINGER RELEASE Left 06/08/2021   Procedure: RELEASE A-1 PULLEY LEFT MIDDLE FINGER;  Surgeon: Daryll Brod, MD;  Location: [place];  Service: Orthopedics;  Laterality: Left;    There were no vitals filed for this visit.   Subjective Assessment - 09/02/21 1600     Subjective No falls, no pain.  Questions about what to do after PT    Currently in Pain? No/denies                [facility] PT Assessment - 09/02/21 0001       Timed Up and Go Test   Normal TUG (seconds) 15                           [facility] Adult PT Treatment/Exercise - 09/02/21 0001       Ambulation/Gait   Gait Comments gait outside with [facility] worked on curbs, she is very fearful of this, then walking in clinic without device 2x 120 feet, then gait outside in the grass and negotiated curbs                       PT Short Term Goals - 04/15/21 1638       PT SHORT TERM GOAL #1   Title independent with initial HEP    Status Achieved               PT Long Term Goals - 09/02/21 1601       PT LONG TERM GOAL #1   Title decrease pain 50%    Status Achieved      PT LONG TERM GOAL #2   Title increase lumbar ROM 25% with less pain    Status Achieved      PT LONG TERM GOAL #3   Title increase LE strength to 4+/5    Status Partially Met       PT LONG TERM GOAL #4   Title go up and down stairs step over step    Status Achieved      PT LONG TERM GOAL #5   Status Achieved                   Plan - 09/02/21 1602     Clinical Impression Statement Patient overall doing well, much less instances of  pain, she still is unstable.  and has right ankle and hip weakness, I gave her handout today for this and went over with her, I think she will be able to do most all things but may have some difficulty with the DF.  Walking with the [facility] on level surfaces she is safe, without device she tends to want to hold on some, outside in grass I prefer her to use two canes or the walker for safety.  She can go up curbs with [facility] and supervision, going down the curb she has mostly a mental issue but can go down with cane and CGA.  We went over safety    PT Next Visit Plan D/C most goals met    PT Home Exercise Plan Access Code: [email or web address]  URL: [email or web address]  Date: 09/02/2021  Prepared by: Lum Babe    Exercises  - Seated Long Arc Quad with Ankle Weight  - 1 x daily - 7 x weekly - 3 sets - 10 reps - 3 hold  - Standing Hip Abduction  - 1 x daily - 7 x weekly - 3 sets - 10 reps - 3 hold  - Standing Marching  - 1 x daily - 7 x weekly - 3 sets - 10 reps - 3 hold  - Standing Hip Extension with Counter Support  - 1 x daily - 7 x weekly - 3 sets - 10 reps - 3 hold  - Seated Ankle Dorsiflexion AROM  - 1 x daily - 7 x weekly - 3 sets - 10 reps - 3 hold  - Ankle Dorsiflexion with Resistance  - 1 x daily - 7 x weekly - 3 sets - 10 reps - 3 hold  - Ankle and Toe Plantarflexion with Resistance  - 1 x daily - 7 x weekly - 3 sets - 10 reps - 3 hold    Consulted and Agree with Plan of Care Patient             Patient will benefit from skilled therapeutic intervention in order to improve the following deficits and impairments:  Abnormal gait, Decreased range of motion, Decreased balance, Decreased scar mobility, Impaired flexibility,  Decreased strength, Decreased mobility, Decreased endurance, Pain, Cardiopulmonary status limiting activity, Decreased activity tolerance, Postural dysfunction, Improper body mechanics, Increased muscle spasms, Difficulty walking  Visit Diagnosis: Acute bilateral low back pain without sciatica  Muscle weakness (generalized)  Difficulty in walking, not elsewhere classified  Muscle spasm of back  Pain in thoracic spine     Problem List Patient Active Problem List   Diagnosis Date Noted   Myelopathy concurrent with and due to spinal stenosis of thoracic region ([place]) 10/27/2020   S/P total knee arthroplasty, right 12/23/2019   Acalculous cholecystitis 08/24/2018   Osteoarthritis of right knee    Vertigo    DDD (degenerative disc disease), cervical    Benign essential HTN    History of DVT (deep vein thrombosis)    Tachycardia    Steroid-induced hyperglycemia    Hypothyroidism    Neuropathic pain    Acute blood loss anemia    S/P lumbar fusion    Closed L5 vertebral fracture (HCC) 08/31/2017   Closed fracture of fifth lumbar vertebra ([place]) 08/30/2017   Lumbar vertebral fracture ([place]) 07/17/2017   Scoliosis 06/13/2017    Sumner Boast, PT 09/02/2021, 4:06 PM  [place]. [place], [place], [address] Phone: [phone]   Fax:  [phone]  Name: [name] MRN: [id number] Date of Birth: Mar 21, 1933

## 2022-02-08 ENCOUNTER — Other Ambulatory Visit: Payer: Self-pay | Admitting: Internal Medicine

## 2022-02-08 DIAGNOSIS — R1010 Upper abdominal pain, unspecified: Secondary | ICD-10-CM

## 2022-02-23 ENCOUNTER — Other Ambulatory Visit: Payer: Self-pay | Admitting: Internal Medicine

## 2022-02-23 DIAGNOSIS — Z1231 Encounter for screening mammogram for malignant neoplasm of breast: Secondary | ICD-10-CM

## 2022-03-19 IMAGING — US US EXTREM UP*L* LTD
1 series · 14 of 25 positions shown · non-contrast
Comparison: None.

CLINICAL DATA: Chronic dorsal and palmar wrist swelling for the
past year.

EXAM:
ULTRASOUND LEFT UPPER EXTREMITY LIMITED
TECHNIQUE: Ultrasound examination of the upper extremity soft tissues was
performed in the area of clinical concern.

[Series 1: us extrem up*left* ltd · 0.05mm/px · 14 of 38 slices shown]
[im 1/38]
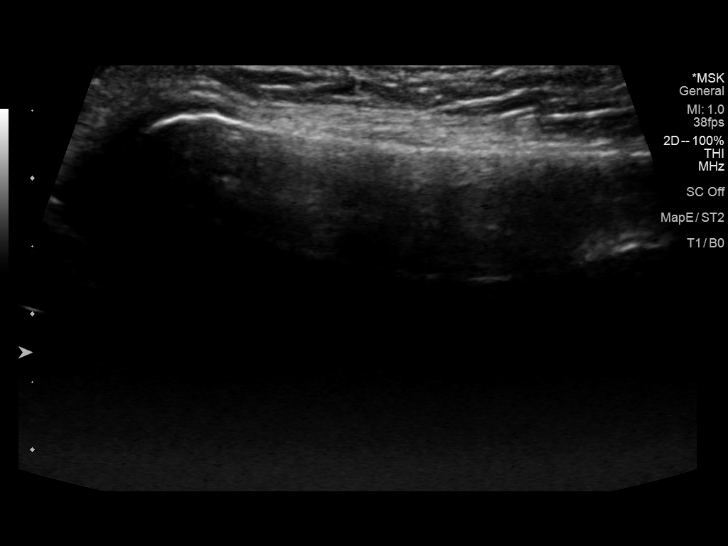
[im 4/38]
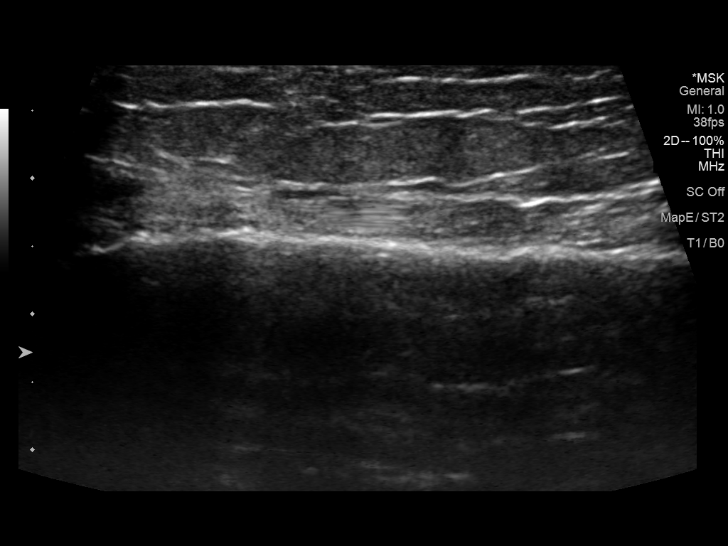
[im 7/38]
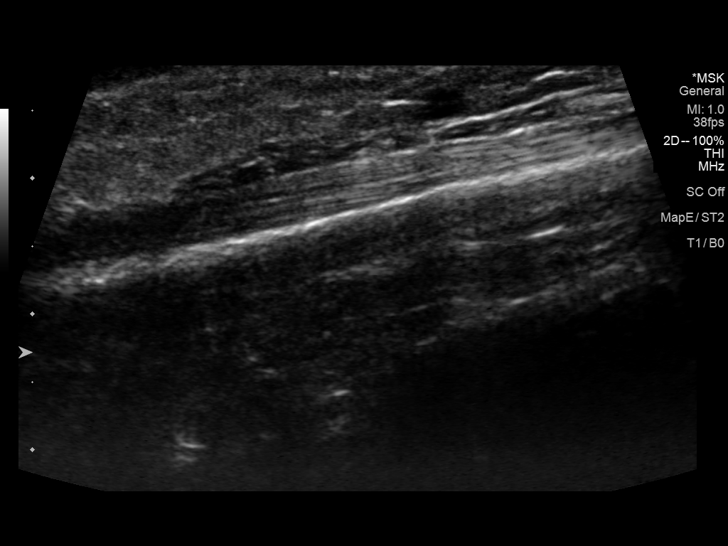
[im 10/38]
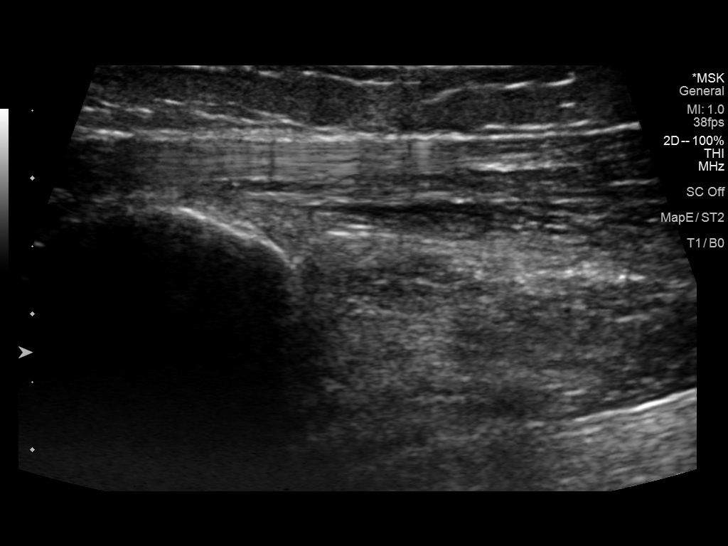
[im 13/38]
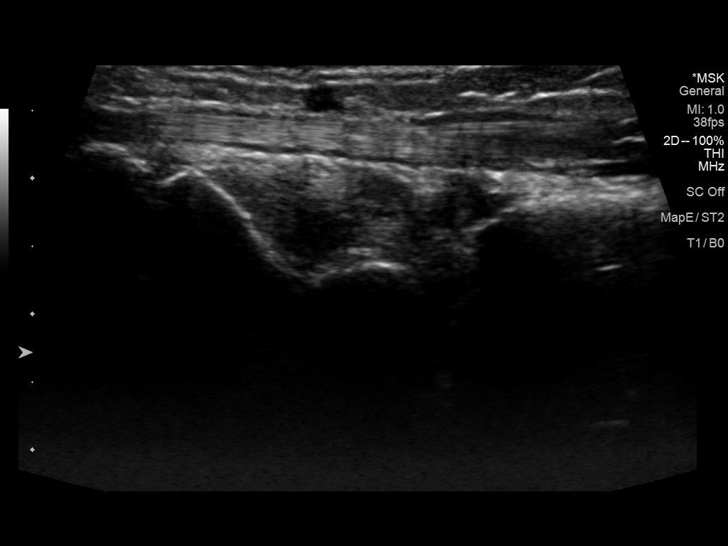
[im 14/38]
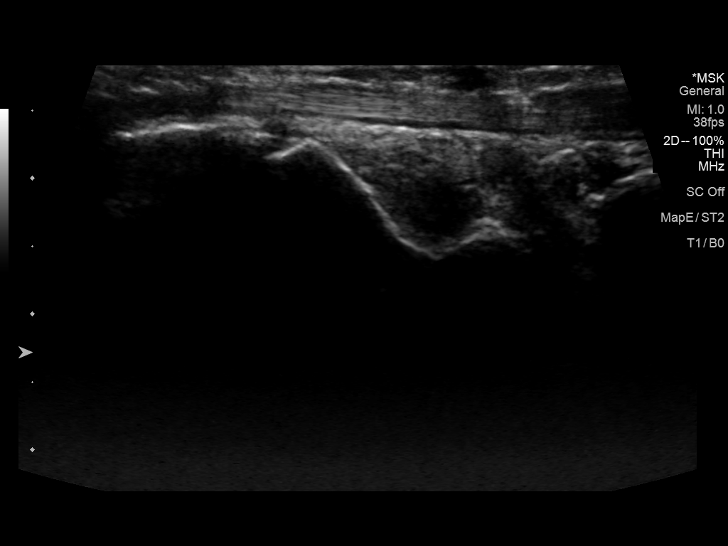
[im 17/38]
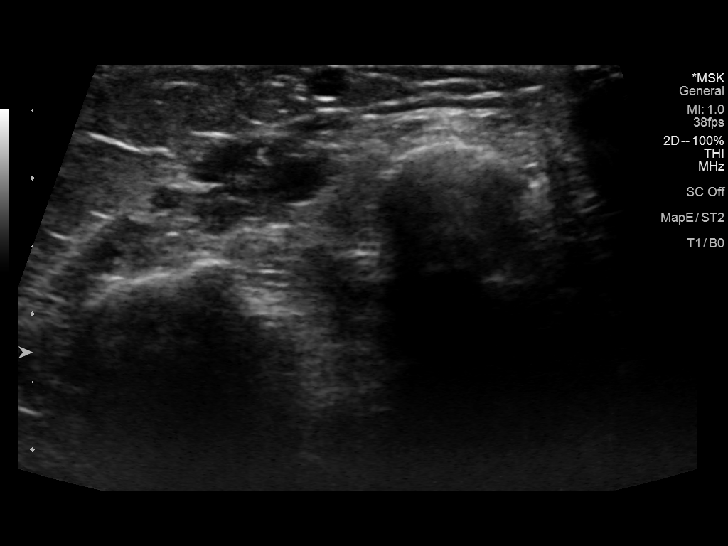
[im 21/38]
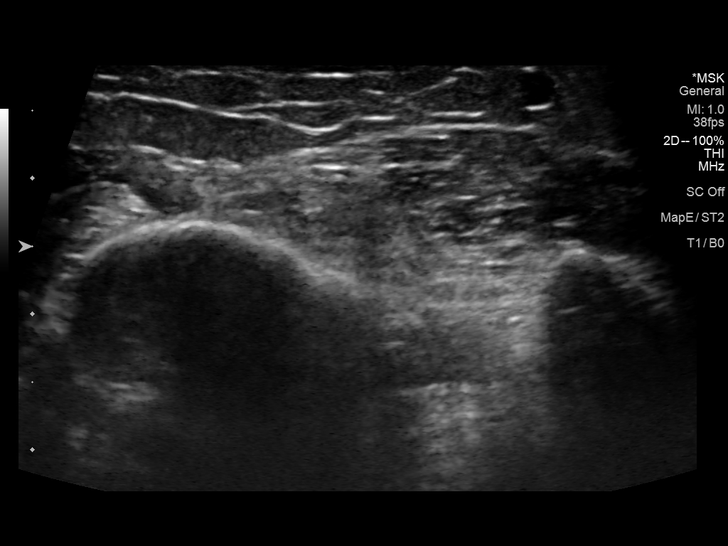
[im 24/38]
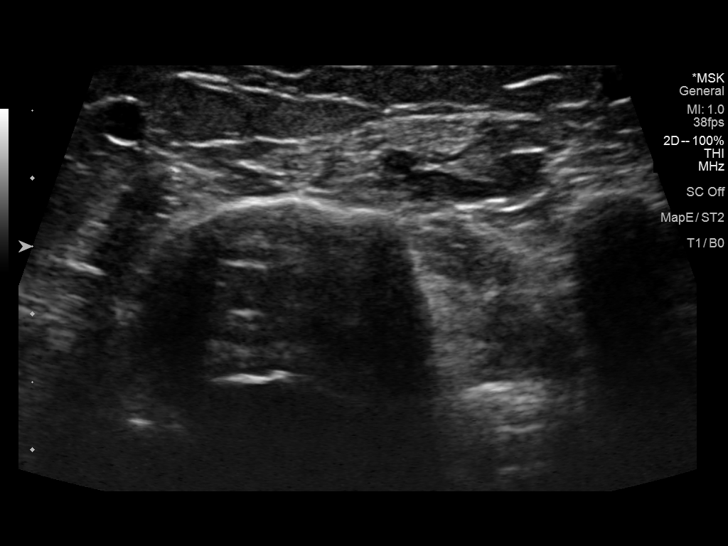
[im 25/38]
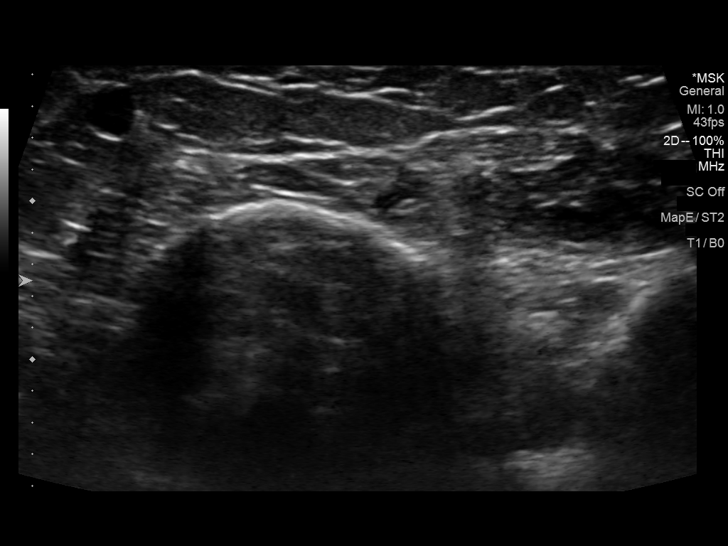
[im 28/38]
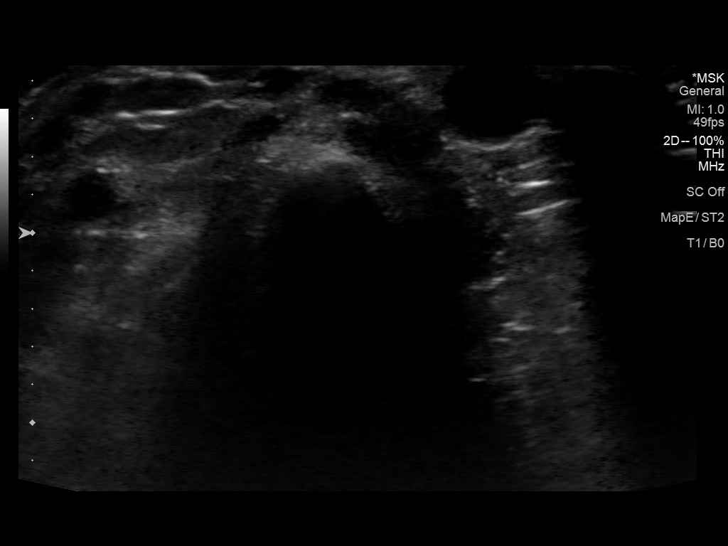
[im 31/38]
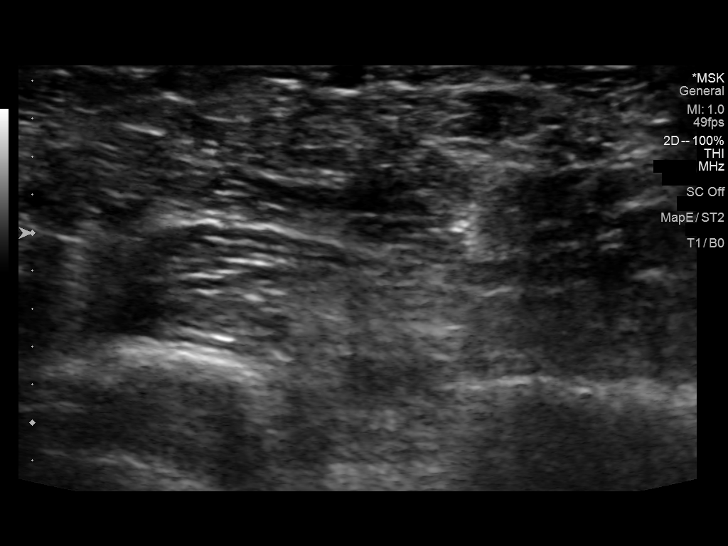
[im 34/38]
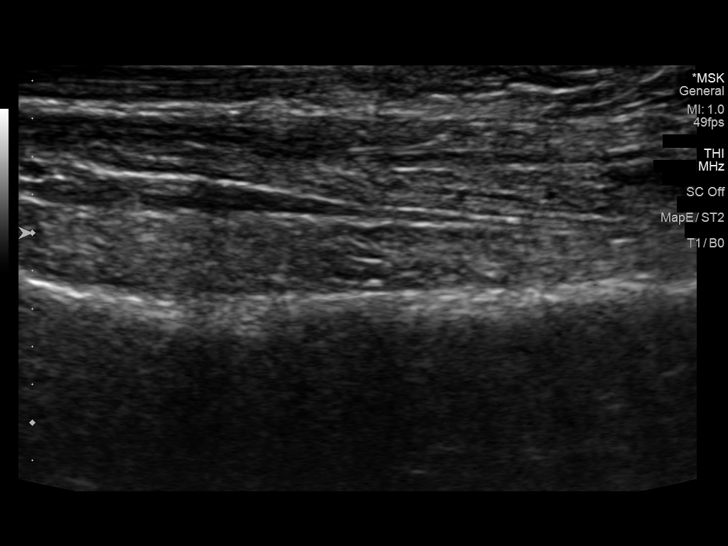
[im 38/38]
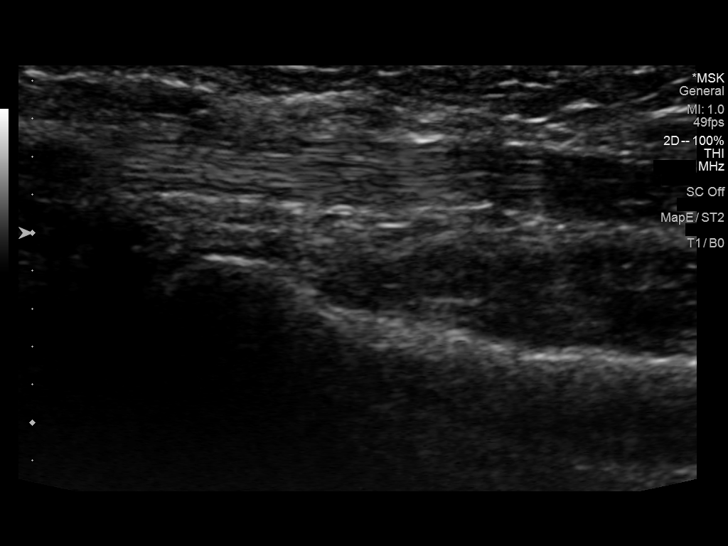

[14 of 25 positions shown; findings below may reference images not displayed]

FINDINGS: Focused ultrasound of the left wrist demonstrates no discrete soft
tissue mass or fluid collection. No tenosynovitis.
IMPRESSION: Negative. No sonographic abnormality.

## 2022-04-08 ENCOUNTER — Ambulatory Visit
Admission: RE | Admit: 2022-04-08 | Discharge: 2022-04-08 | Disposition: A | Discharge status: Home / Self Care | Payer: Medicare Other | Source: Ambulatory Visit | Attending: Internal Medicine | Admitting: Internal Medicine

## 2022-04-08 DIAGNOSIS — R1010 Upper abdominal pain, unspecified: Secondary | ICD-10-CM

## 2022-04-08 MED ORDER — IOPAMIDOL (ISOVUE-300) INJECTION 61%
100.0000 mL | Freq: Once | INTRAVENOUS | Status: AC | PRN
  Administered 2022-04-08: 100 mL via INTRAVENOUS

## 2022-04-21 ENCOUNTER — Ambulatory Visit: Payer: Medicare Other

## 2022-05-02 ENCOUNTER — Ambulatory Visit: Payer: Medicare Other

## 2022-05-05 ENCOUNTER — Other Ambulatory Visit: Payer: Self-pay | Admitting: Internal Medicine

## 2022-05-05 ENCOUNTER — Ambulatory Visit
Admission: RE | Admit: 2022-05-05 | Discharge: 2022-05-05 | Disposition: A | Discharge status: Home / Self Care | Payer: Medicare Other | Source: Ambulatory Visit | Attending: Internal Medicine | Admitting: Internal Medicine

## 2022-05-05 DIAGNOSIS — M25562 Pain in left knee: Secondary | ICD-10-CM

## 2022-06-16 ENCOUNTER — Ambulatory Visit
Admission: RE | Admit: 2022-06-16 | Discharge: 2022-06-16 | Disposition: A | Discharge status: Home / Self Care | Payer: Medicare Other | Source: Ambulatory Visit | Attending: Internal Medicine | Admitting: Internal Medicine

## 2022-06-16 DIAGNOSIS — Z1231 Encounter for screening mammogram for malignant neoplasm of breast: Secondary | ICD-10-CM

## 2022-07-30 IMAGING — MG DIGITAL SCREENING BILAT W/ TOMO W/ CAD
8 series · 8 of 24 positions shown · non-contrast
Comparison: Previous exam(s).

CLINICAL DATA: Screening.

EXAM:
DIGITAL SCREENING BILATERAL MAMMOGRAM WITH TOMO AND CAD

[L CC synth-2D]
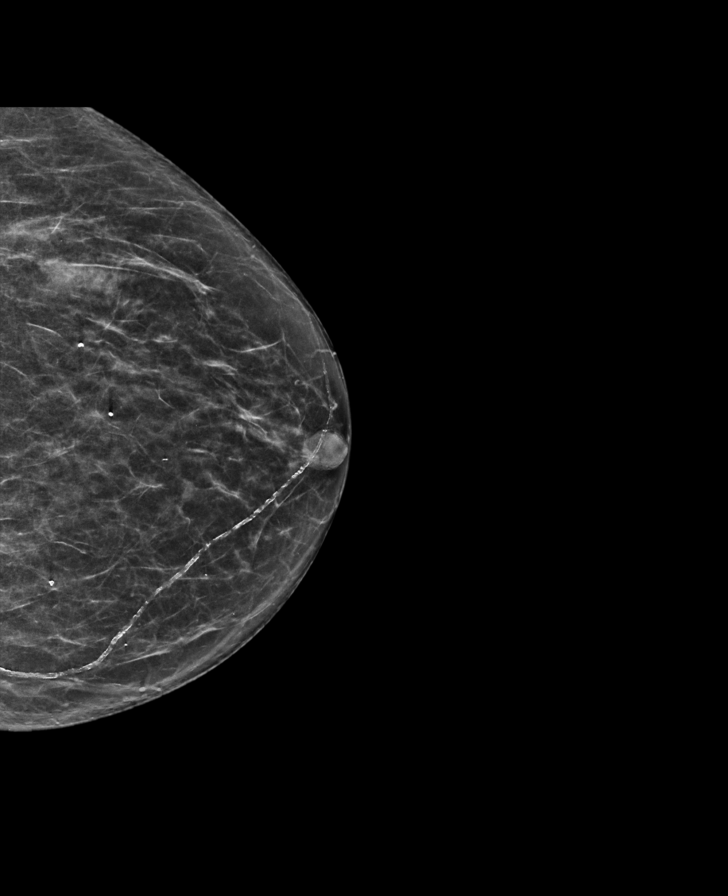

[R CC synth-2D]
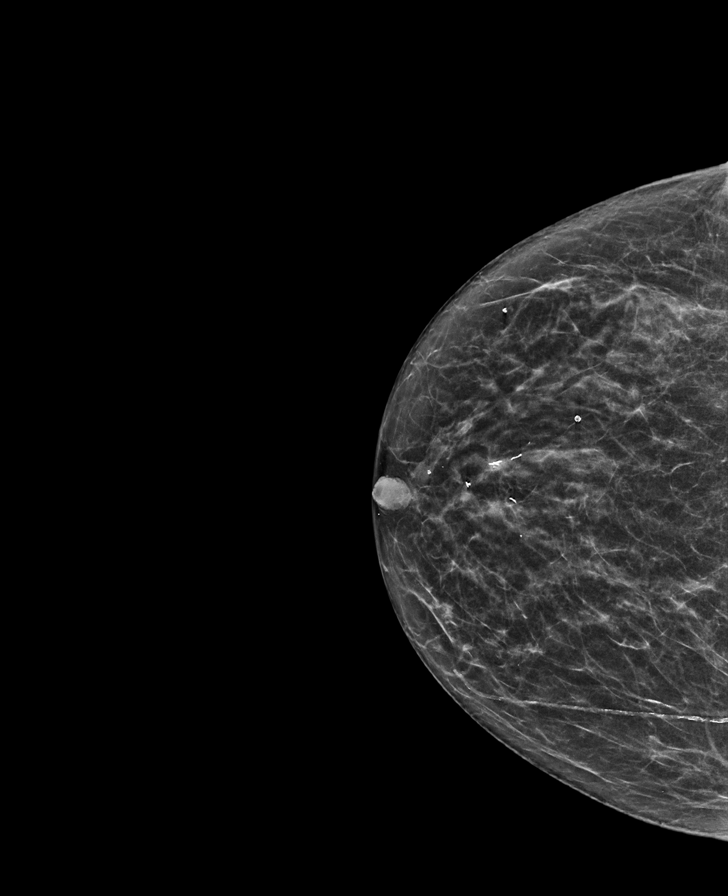

[R MLO synth-2D]
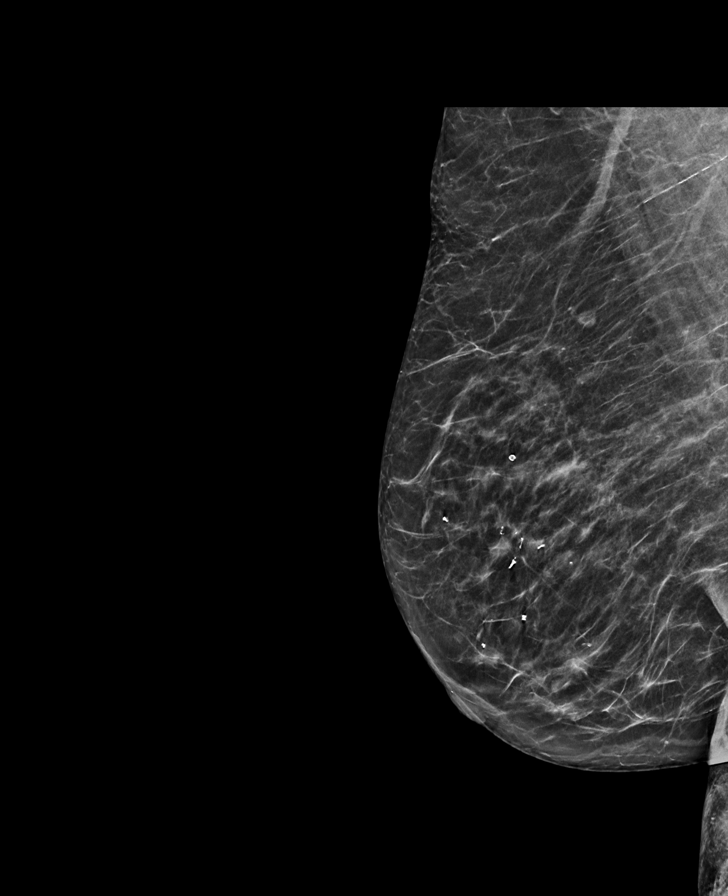

[L MLO synth-2D]
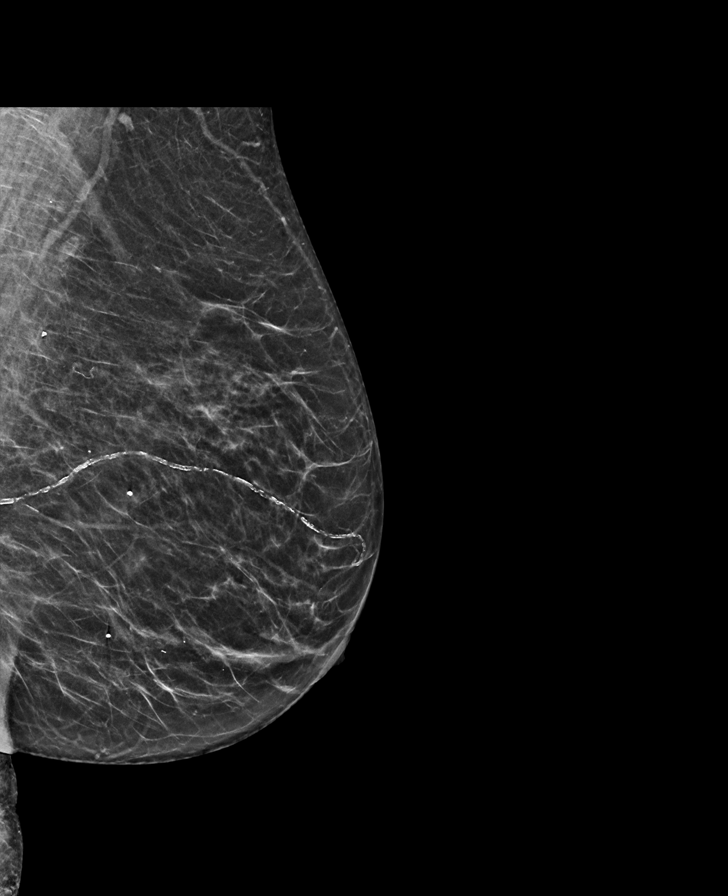

[L CC tomo · tomo slice 29/57.0]
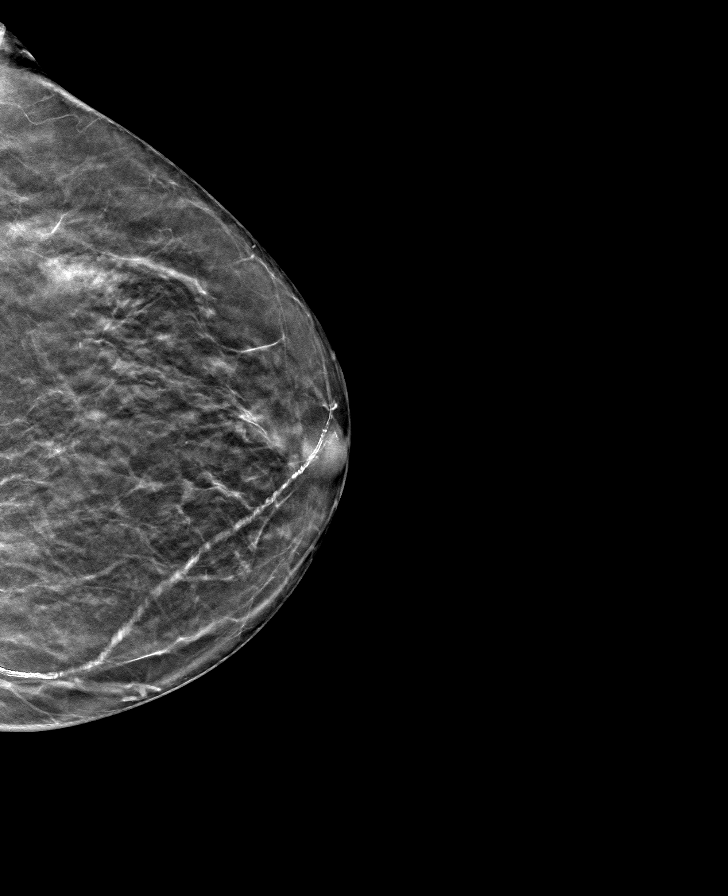

[L MLO tomo · tomo slice 29/57.0]
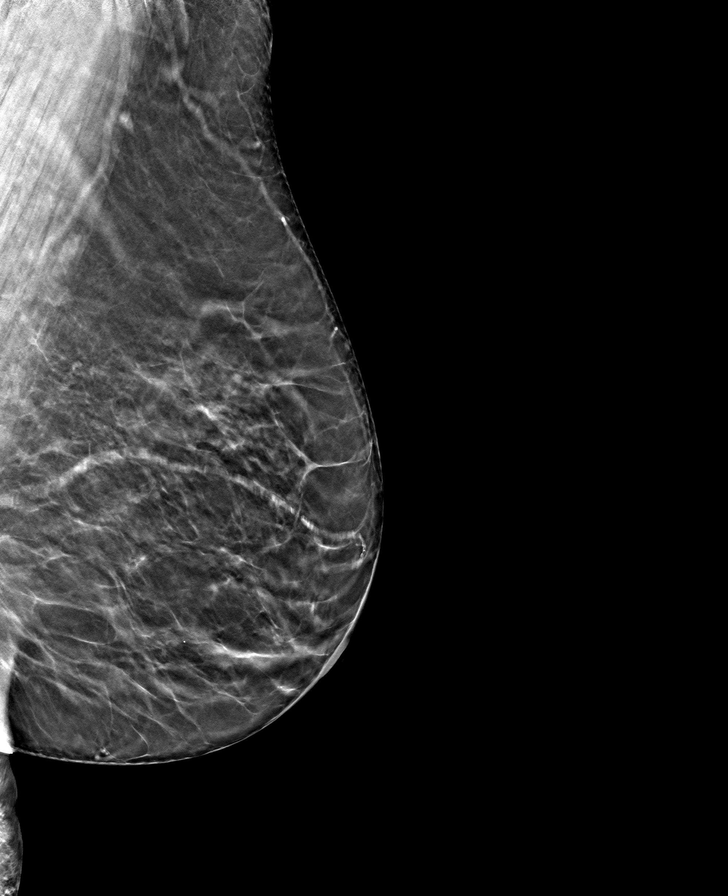

[R MLO tomo · tomo slice 31/61.0]
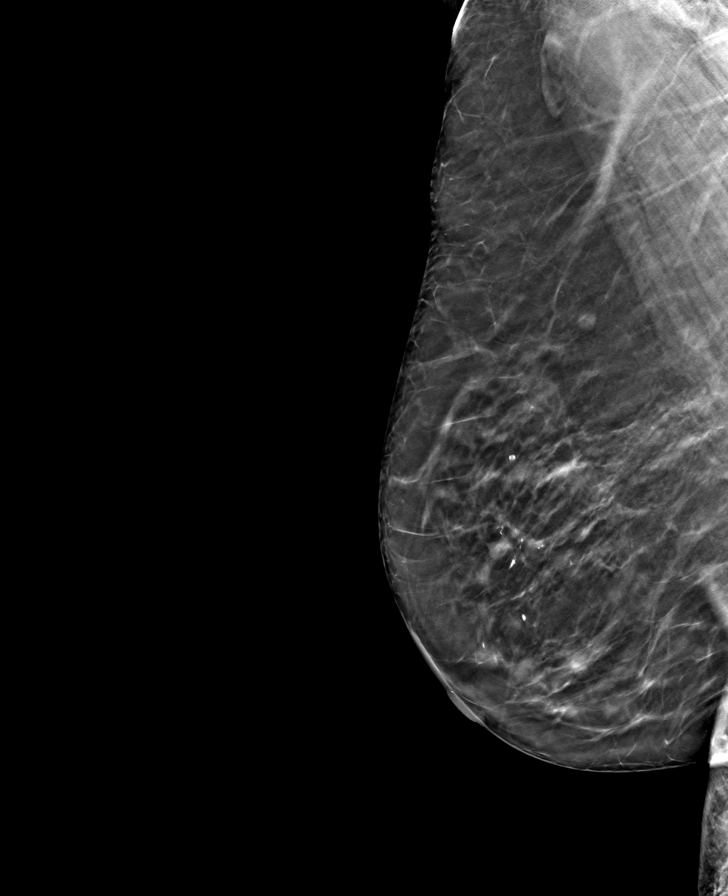

[R CC tomo · tomo slice 28/55.0]
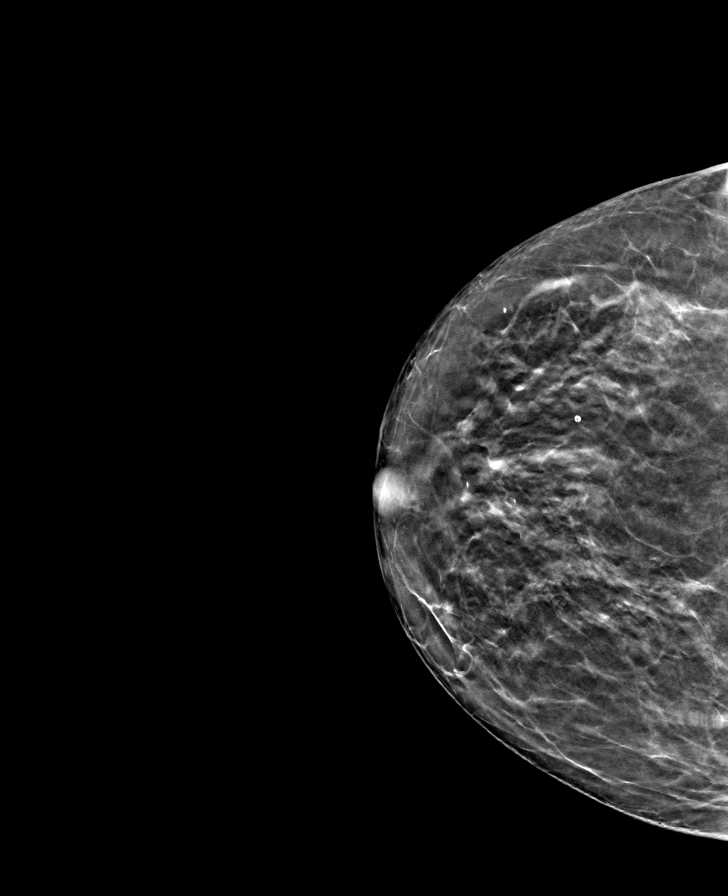

[8 of 24 positions shown; findings below may reference images not displayed]

ACR Breast Density Category b: There are scattered areas of
fibroglandular density.
FINDINGS: There are no findings suspicious for malignancy. Images were
processed with CAD.
IMPRESSION: No mammographic evidence of malignancy. A result letter of this
screening mammogram will be mailed directly to the patient.

RECOMMENDATION:
Screening mammogram in one year. (Code:[id number])

BI-RADS CATEGORY  1: Negative.

## 2022-10-27 ENCOUNTER — Encounter: Payer: Self-pay | Admitting: Physical Therapy

## 2022-10-27 ENCOUNTER — Ambulatory Visit: Payer: Medicare Other | Attending: Orthopedic Surgery | Admitting: Physical Therapy

## 2022-10-27 DIAGNOSIS — R600 Localized edema: Secondary | ICD-10-CM | POA: Diagnosis present

## 2022-10-27 DIAGNOSIS — R262 Difficulty in walking, not elsewhere classified: Secondary | ICD-10-CM

## 2022-10-27 DIAGNOSIS — M25662 Stiffness of left knee, not elsewhere classified: Secondary | ICD-10-CM | POA: Diagnosis present

## 2022-10-27 DIAGNOSIS — M25562 Pain in left knee: Secondary | ICD-10-CM | POA: Diagnosis present

## 2022-10-27 NOTE — Therapy (Signed)
OUTPATIENT PHYSICAL THERAPY LOWER EXTREMITY EVALUATION   Patient Name: [name] MRN: [id number] DOB:24-Jul-1932, 89 y.o., female Today's Date: 10/27/2022  END OF SESSION:  PT End of Session - 10/27/22 1609     Visit Number 1    Date for PT Re-Evaluation 01/27/23    Authorization Type [employer]    PT Start Time 1605    PT Stop Time 1655    PT Time Calculation (min) 50 min    Activity Tolerance Patient tolerated treatment well    Behavior During Therapy WFL for tasks assessed/performed             Past Medical History:  Diagnosis Date   Acalculous cholecystitis 08/24/2018   Arthritis    DVT of lower extremity (deep venous thrombosis) (HCC)    right leg - after back surgery in 2019   GERD (gastroesophageal reflux disease)    History of blood transfusion    Hypothyroidism    PONV (postoperative nausea and vomiting)    Ptosis of both eyelids    Vertigo    Wears glasses    reading   Past Surgical History:  Procedure Laterality Date   ABDOMINAL HYSTERECTOMY  1986   APPLICATION OF ROBOTIC ASSISTANCE FOR SPINAL PROCEDURE N/A 07/17/2017   Procedure: APPLICATION OF ROBOTIC ASSISTANCE FOR SPINAL PROCEDURE;  Surgeon: Barnett Abu, MD;  [facility];  Service: Neurosurgery;  Laterality: N/A;   APPLICATION OF ROBOTIC ASSISTANCE FOR SPINAL PROCEDURE N/A 09/01/2017   Procedure: APPLICATION OF ROBOTIC ASSISTANCE FOR SPINAL PROCEDURE;  Surgeon: Barnett Abu, MD;  [facility];  Service: Neurosurgery;  Laterality: N/A;   APPLICATION OF ROBOTIC ASSISTANCE FOR SPINAL PROCEDURE N/A 10/27/2020   Procedure: APPLICATION OF ROBOTIC ASSISTANCE FOR SPINAL PROCEDURE;  Surgeon: Barnett Abu, MD;  [facility];  Service: Neurosurgery;  Laterality: N/A;   BACK SURGERY  1986   lumb lam   BREAST BIOPSY Right 2011   CARPAL TUNNEL RELEASE  2000   right   CARPAL TUNNEL RELEASE  2012   left   CHOLECYSTECTOMY N/A 08/24/2018   Procedure: LAPAROSCOPIC CHOLECYSTECTOMY WITH  INTRAOPERATIVE CHOLANGIOGRAM;  Surgeon: Claud Kelp, MD;  Location: [facility] OR;  Service: General;  Laterality: N/A;   COLONOSCOPY     CYSTOCELE REPAIR  2007   sling   DISTAL INTERPHALANGEAL JOINT FUSION Right 02/04/2014   Procedure: FUSION DISTAL INTERPHALANGEAL JOINT RIGHT INDEX FINGER ;  Surgeon: Cindee Salt, MD;  Location: [facility];  Service: Orthopedics;  Laterality: Right;   EYE SURGERY Bilateral    Cataract surgery with lens implant   KNEE SURGERY  2009,2011   partial knee   LAPAROSCOPY N/A 08/24/2018   Procedure: LAPAROSCOPY DIAGNOSTIC;  Surgeon: Claud Kelp, MD;  Location: [facility] OR;  Service: General;  Laterality: N/A;   LUMBAR PERCUTANEOUS PEDICLE SCREW 4 LEVEL N/A 10/27/2020   Procedure: Thoracic nine-ten Laminectomy with extension of fusion from Thoracic ten to Thoracic six with segmental fixation (cemented) with pedicle screw and mazor;  Surgeon: Barnett Abu, MD;  Location: [facility] OR;  Service: Neurosurgery;  Laterality: N/A;   OPEN REDUCTION INTERNAL FIXATION (ORIF) PROXIMAL PHALANX Left 08/30/2013   Procedure: OPEN REDUCTION INTERNAL FIXATION (ORIF) PROXIMAL PHALANX FRACTURE LEFT SMALL FINGER; SPLINT RING FINGER;  Surgeon: Nicki Reaper, MD;  Location: [facility];  Service: Orthopedics;  Laterality: Left;   POSTERIOR LUMBAR FUSION 4 LEVEL N/A 07/17/2017   Procedure: Thoracic Ten - Lumbar Five revison of hardware with Mazor;  Surgeon: Barnett Abu, MD;  [facility];  Service: Neurosurgery;  Laterality: N/A;  Thoracic/Lumbar   PTOSIS REPAIR Bilateral 07/27/2015   Procedure: PTOSIS REPAIR;  Surgeon: Louisa Second, MD;  Location: [facility];  Service: Plastics;  Laterality: Bilateral;   SHOULDER SURGERY     rt rcr,and lt   TENOLYSIS Left 05/21/2021   Procedure: RELEASE EXTENSOR POLLICIS LONGUS LEFT;  Surgeon: Cindee Salt, MD;  Location: [facility];  Service: Orthopedics;  Laterality: Left;   TENOLYSIS Left 06/08/2021    Procedure: RELEASE EXTENSOR POLLICIS LONGUS LEFT;  Surgeon: Cindee Salt, MD;  Location: [facility];  Service: Orthopedics;  Laterality: Left;   TONSILLECTOMY     TOTAL KNEE ARTHROPLASTY Right 12/23/2019   Procedure: TOTAL KNEE ARTHROPLASTY;  Surgeon: Dannielle Huh, MD;  Location: [facility];  Service: Orthopedics;  Laterality: Right;   TRIGGER FINGER RELEASE Left 05/21/2021   Procedure: RELEASE TRIGGER FINGER/A-1 PULLEY LEFT MIDDLE FINGER;  Surgeon: Cindee Salt, MD;  Location: [facility];  Service: Orthopedics;  Laterality: Left;   TRIGGER FINGER RELEASE Left 06/08/2021   Procedure: RELEASE A-1 PULLEY LEFT MIDDLE FINGER;  Surgeon: Cindee Salt, MD;  Location: [facility];  Service: Orthopedics;  Laterality: Left;   Patient Active Problem List   Diagnosis Date Noted   Myelopathy concurrent with and due to spinal stenosis of thoracic region (HCC) 10/27/2020   S/P total knee arthroplasty, right 12/23/2019   Acalculous cholecystitis 08/24/2018   Osteoarthritis of right knee    Vertigo    DDD (degenerative disc disease), cervical    Benign essential HTN    History of DVT (deep vein thrombosis)    Tachycardia    Steroid-induced hyperglycemia    Hypothyroidism    Neuropathic pain    Acute blood loss anemia    S/P lumbar fusion    Closed L5 vertebral fracture (HCC) 08/31/2017   Closed fracture of fifth lumbar vertebra (HCC) 08/30/2017   Lumbar vertebral fracture (HCC) 07/17/2017   Scoliosis 06/13/2017    PCP: Burton Apley, MD  REFERRING PROVIDER: Sherlean Foot, MD  REFERRING DIAG: S/P left TKA  THERAPY DIAG:  Acute pain of left knee  Stiffness of left knee, not elsewhere classified  Localized edema  Difficulty in walking, not elsewhere classified  Rationale for Evaluation and Treatment: Rehabilitation  ONSET DATE: 10/17/22  SUBJECTIVE:   SUBJECTIVE STATEMENT: Patient underwent left TKA on 10/17/22, she reports that she has not been doing  well due to pain and swelling reports that she has to use her hands to lift the leg up.  Uses walker for gait and is very slow and timid with walking  PERTINENT HISTORY: See above PAIN:  Are you having pain? Yes: NPRS scale: 3/10 Pain location: left knee Pain description: ache, deep Aggravating factors: night, bending, being up on the leg pain a 10/10 Relieving factors: sit down, elevate, pain medication, ice pain can be 0/10  PRECAUTIONS: None  RED FLAGS: None   WEIGHT BEARING RESTRICTIONS: No  FALLS:  Has patient fallen in last 6 months? No  LIVING ENVIRONMENT: Lives with: lives with their family and lives with their spouse Lives in: House/apartment Stairs: Yes: Internal: 12 steps; can reach both Has following equipment at home: Walker - 4 wheeled, Shower bench, and bed side commode  OCCUPATION: retired  PLOF: Independent and was using a SPC, she was doing some cooking and cleaning  PATIENT GOALS: have good ROM, less pain, walk better, go up and down stairs  NEXT MD  VISIT: 11/30/22  OBJECTIVE:   DIAGNOSTIC FINDINGS: s/p left TKA  PATIENT SURVEYS:  FOTO 6.5  COGNITION: Overall cognitive status: Within functional limits for tasks assessed     SENSATION: WFL Numbness laterally  EDEMA:  Circumferential: right mid patella 46 cm, left 55 cm  PALPATION: Very swollen in the knee, the shin and the foot, very tender to palpation in the thigh, knee and shin, very sensitive where the tourniquet was  LOWER EXTREMITY ROM:  Active ROM Left AROM eval Left PROM eval  Hip flexion    Hip extension    Hip abduction    Hip adduction    Hip internal rotation    Hip external rotation    Knee flexion 80 85  Knee extension 27 20  Ankle dorsiflexion    Ankle plantarflexion    Ankle inversion    Ankle eversion     (Blank rows = not tested)  LOWER EXTREMITY MMT:  MMT Right eval Left eval  Hip flexion    Hip extension    Hip abduction    Hip adduction    Hip  internal rotation    Hip external rotation    Knee flexion  3-  Knee extension  2  Ankle dorsiflexion    Ankle plantarflexion    Ankle inversion    Ankle eversion     (Blank rows = not tested) FUNCTIONAL TESTS:  Timed up and go (TUG): 51 seconds  GAIT: Distance walked: 50 feet Assistive device utilized: Walker - 2 wheeled Level of assistance: SBA Comments: slow and guarded, small steps   TODAY'S TREATMENT:                                                                                                                              DATE:     PATIENT EDUCATION:  Education details: HEP/POC Person educated: Patient Education method: Programmer, multimedia, Facilities manager, Verbal cues, and Handouts Education comprehension: verbalized understanding  HOME EXERCISE PROGRAM: Access Code: [email or web address] URL: [email or web address] Date: 10/27/2022 Prepared by: Stacie Glaze  Exercises - Seated March  - 2 x daily - 7 x weekly - 1 sets - 10 reps - 3 hold - Seated Long Arc Quad  - 2 x daily - 7 x weekly - 1 sets - 10 reps - 3 hold - Seated Heel Raise  - 2 x daily - 7 x weekly - 1 sets - 10 reps - 3 hold - Seated Knee Flexion Stretch  - 1 x daily - 7 x weekly - 1 sets - 10 reps - 15 hold  ASSESSMENT:  CLINICAL IMPRESSION: Patient is a 89 y.o. female who was seen today for physical therapy evaluation and treatment for s/p left TKA on 10/17/22.  She is very sore, has significant swelling.  She is very slow and has a lot of difficulty getting up, has poor ROM.  OBJECTIVE IMPAIRMENTS: Abnormal gait, cardiopulmonary status limiting activity, decreased activity tolerance, decreased balance, decreased endurance, decreased  mobility, difficulty walking, decreased ROM, decreased strength, increased edema, impaired flexibility, improper body mechanics, and pain.   REHAB POTENTIAL: Good  CLINICAL DECISION MAKING: Stable/uncomplicated  EVALUATION COMPLEXITY: Low   GOALS: Goals reviewed with  patient? Yes  SHORT TERM GOALS: Target date: 11/11/22 Independent with initial HEP Goal status: INITIAL  LONG TERM GOALS: Target date: 01/27/23  Independent with advanced HEP Goal status: INITIAL  2.  Decrease pain 50% Goal status: INITIAL  3.  Increase AROM to 10-110 degrees flexion Goal status: INITIAL  4.  Go up and down stairs safely step over step with hand rails Goal status: INITIAL  5.  Decrease swelling by 4cm Goal status: INITIAL  6.  Decrease TUG to 24 seconds Goal status: INITIAL   PLAN:  PT FREQUENCY: 1-2x/week  PT DURATION: 12 weeks  PLANNED INTERVENTIONS: Therapeutic exercises, Therapeutic activity, Neuromuscular re-education, Balance training, Gait training, Patient/Family education, Self Care, Joint mobilization, Joint manipulation, Stair training, Electrical stimulation, Vasopneumatic device, and Manual therapy  PLAN FOR NEXT SESSION: slowly start activities for strength, ROM, function and gait   Maika Kaczmarek W, PT 10/27/2022, 4:10 PM

## 2022-11-01 ENCOUNTER — Encounter: Payer: Self-pay | Admitting: Physical Therapy

## 2022-11-01 ENCOUNTER — Ambulatory Visit: Payer: Medicare Other | Admitting: Physical Therapy

## 2022-11-01 DIAGNOSIS — R262 Difficulty in walking, not elsewhere classified: Secondary | ICD-10-CM

## 2022-11-01 DIAGNOSIS — R600 Localized edema: Secondary | ICD-10-CM

## 2022-11-01 DIAGNOSIS — M25662 Stiffness of left knee, not elsewhere classified: Secondary | ICD-10-CM

## 2022-11-01 DIAGNOSIS — M25562 Pain in left knee: Secondary | ICD-10-CM

## 2022-11-01 NOTE — Therapy (Signed)
OUTPATIENT PHYSICAL THERAPY LOWER EXTREMITY EVALUATION   Patient Name: [name] MRN: [id number] DOB:November 23, 1932, 89 y.o., female Today's Date: 11/01/2022  END OF SESSION:  PT End of Session - 11/01/22 1605     Visit Number 2    Date for PT Re-Evaluation 01/27/23    PT Start Time 1605    PT Stop Time 1645    PT Time Calculation (min) 40 min    Activity Tolerance Patient tolerated treatment well    Behavior During Therapy [facility] for tasks assessed/performed             Past Medical History:  Diagnosis Date   Acalculous cholecystitis 08/24/2018   Arthritis    DVT of lower extremity (deep venous thrombosis) (HCC)    right leg - after back surgery in 2019   GERD (gastroesophageal reflux disease)    History of blood transfusion    Hypothyroidism    PONV (postoperative nausea and vomiting)    Ptosis of both eyelids    Vertigo    Wears glasses    reading   Past Surgical History:  Procedure Laterality Date   ABDOMINAL HYSTERECTOMY  1986   APPLICATION OF ROBOTIC ASSISTANCE FOR SPINAL PROCEDURE N/A 07/17/2017   Procedure: APPLICATION OF ROBOTIC ASSISTANCE FOR SPINAL PROCEDURE;  Surgeon: Barnett Abu, MD;  Location: [facility];  Service: Neurosurgery;  Laterality: N/A;   APPLICATION OF ROBOTIC ASSISTANCE FOR SPINAL PROCEDURE N/A 09/01/2017   Procedure: APPLICATION OF ROBOTIC ASSISTANCE FOR SPINAL PROCEDURE;  Surgeon: Barnett Abu, MD;  [facility]: [facility];  Service: Neurosurgery;  Laterality: N/A;   APPLICATION OF ROBOTIC ASSISTANCE FOR SPINAL PROCEDURE N/A 10/27/2020   Procedure: APPLICATION OF ROBOTIC ASSISTANCE FOR SPINAL PROCEDURE;  Surgeon: Barnett Abu, MD;  [facility];  Service: Neurosurgery;  Laterality: N/A;   BACK SURGERY  1986   lumb lam   BREAST BIOPSY Right 2011   CARPAL TUNNEL RELEASE  2000   right   CARPAL TUNNEL RELEASE  2012   left   CHOLECYSTECTOMY N/A 08/24/2018   Procedure: LAPAROSCOPIC CHOLECYSTECTOMY WITH INTRAOPERATIVE CHOLANGIOGRAM;  Surgeon:  Claud Kelp, MD;  Location: [facility] OR;  Service: General;  Laterality: N/A;   COLONOSCOPY     CYSTOCELE REPAIR  2007   sling   DISTAL INTERPHALANGEAL JOINT FUSION Right 02/04/2014   Procedure: FUSION DISTAL INTERPHALANGEAL JOINT RIGHT INDEX FINGER ;  Surgeon: Cindee Salt, MD;  Location: [facility];  Service: Orthopedics;  Laterality: Right;   EYE SURGERY Bilateral    Cataract surgery with lens implant   KNEE SURGERY  2009,2011   partial knee   LAPAROSCOPY N/A 08/24/2018   Procedure: LAPAROSCOPY DIAGNOSTIC;  Surgeon: Claud Kelp, MD;  Location: [facility] OR;  Service: General;  Laterality: N/A;   LUMBAR PERCUTANEOUS PEDICLE SCREW 4 LEVEL N/A 10/27/2020   Procedure: Thoracic nine-ten Laminectomy with extension of fusion from Thoracic ten to Thoracic six with segmental fixation (cemented) with pedicle screw and mazor;  Surgeon: Barnett Abu, MD;  Location: [facility] OR;  Service: Neurosurgery;  Laterality: N/A;   OPEN REDUCTION INTERNAL FIXATION (ORIF) PROXIMAL PHALANX Left 08/30/2013   Procedure: OPEN REDUCTION INTERNAL FIXATION (ORIF) PROXIMAL PHALANX FRACTURE LEFT SMALL FINGER; SPLINT RING FINGER;  Surgeon: Nicki Reaper, MD;  Location: [facility];  Service: Orthopedics;  Laterality: Left;   POSTERIOR LUMBAR FUSION 4 LEVEL N/A 07/17/2017   Procedure: Thoracic Ten - Lumbar Five revison of hardware with Mazor;  Surgeon: Barnett Abu, MD;  Location: [facility];  Service: Neurosurgery;  Laterality: N/A;  Thoracic/Lumbar   PTOSIS REPAIR Bilateral 07/27/2015   Procedure: PTOSIS REPAIR;  Surgeon: Louisa Second, MD;  Location: [facility];  Service: Plastics;  Laterality: Bilateral;   SHOULDER SURGERY     rt rcr,and lt   TENOLYSIS Left 05/21/2021   Procedure: RELEASE EXTENSOR POLLICIS LONGUS LEFT;  Surgeon: Cindee Salt, MD;  Location: [facility];  Service: Orthopedics;  Laterality: Left;   TENOLYSIS Left 06/08/2021   Procedure: RELEASE EXTENSOR POLLICIS  LONGUS LEFT;  Surgeon: Cindee Salt, MD;  Location: [facility];  Service: Orthopedics;  Laterality: Left;   TONSILLECTOMY     TOTAL KNEE ARTHROPLASTY Right 12/23/2019   Procedure: TOTAL KNEE ARTHROPLASTY;  Surgeon: Dannielle Huh, MD;  Location: [facility];  Service: Orthopedics;  Laterality: Right;   TRIGGER FINGER RELEASE Left 05/21/2021   Procedure: RELEASE TRIGGER FINGER/A-1 PULLEY LEFT MIDDLE FINGER;  Surgeon: Cindee Salt, MD;  Location: [facility];  Service: Orthopedics;  Laterality: Left;   TRIGGER FINGER RELEASE Left 06/08/2021   Procedure: RELEASE A-1 PULLEY LEFT MIDDLE FINGER;  Surgeon: Cindee Salt, MD;  Location: [facility];  Service: Orthopedics;  Laterality: Left;   Patient Active Problem List   Diagnosis Date Noted   Myelopathy concurrent with and due to spinal stenosis of thoracic region (HCC) 10/27/2020   S/P total knee arthroplasty, right 12/23/2019   Acalculous cholecystitis 08/24/2018   Osteoarthritis of right knee    Vertigo    DDD (degenerative disc disease), cervical    Benign essential HTN    History of DVT (deep vein thrombosis)    Tachycardia    Steroid-induced hyperglycemia    Hypothyroidism    Neuropathic pain    Acute blood loss anemia    S/P lumbar fusion    Closed L5 vertebral fracture (HCC) 08/31/2017   Closed fracture of fifth lumbar vertebra (HCC) 08/30/2017   Lumbar vertebral fracture (HCC) 07/17/2017   Scoliosis 06/13/2017    PCP: Burton Apley, MD  REFERRING PROVIDER: Sherlean Foot, MD  REFERRING DIAG: S/P left TKA  THERAPY DIAG:  Acute pain of left knee  Difficulty in walking, not elsewhere classified  Localized edema  Stiffness of left knee, not elsewhere classified  Rationale for Evaluation and Treatment: Rehabilitation  ONSET DATE: 10/17/22  SUBJECTIVE:   SUBJECTIVE STATEMENT: "I feel pretty good" keep Ice on it   PERTINENT HISTORY: See above PAIN:  Are you having pain? Yes: NPRS scale:  8/10 Pain location: left knee Pain description: ache, deep Aggravating factors: night, bending, being up on the leg pain a 10/10 Relieving factors: sit down, elevate, pain medication, ice pain can be 0/10  PRECAUTIONS: None  RED FLAGS: None   WEIGHT BEARING RESTRICTIONS: No  FALLS:  Has patient fallen in last 6 months? No  LIVING ENVIRONMENT: Lives with: lives with their family and lives with their spouse Lives in: House/apartment Stairs: Yes: Internal: 12 steps; can reach both Has following equipment at home: Walker - 4 wheeled, Shower bench, and bed side commode  OCCUPATION: retired  PLOF: Independent and was using a SPC, she was doing some cooking and cleaning  PATIENT GOALS: have good ROM, less pain, walk better, go up and down stairs  NEXT MD VISIT: 11/30/22  OBJECTIVE:   DIAGNOSTIC FINDINGS: s/p left TKA  PATIENT SURVEYS:  FOTO 6.5  COGNITION: Overall cognitive status: Within functional limits for tasks assessed     SENSATION: WFL Numbness laterally  EDEMA:  Circumferential: right mid patella 46  cm, left 55 cm  PALPATION: Very swollen in the knee, the shin and the foot, very tender to palpation in the thigh, knee and shin, very sensitive where the tourniquet was  LOWER EXTREMITY ROM:  Active ROM Left AROM eval Left PROM eval  Hip flexion    Hip extension    Hip abduction    Hip adduction    Hip internal rotation    Hip external rotation    Knee flexion 80 85  Knee extension 27 20  Ankle dorsiflexion    Ankle plantarflexion    Ankle inversion    Ankle eversion     (Blank rows = not tested)  LOWER EXTREMITY MMT:  MMT Right eval Left eval  Hip flexion    Hip extension    Hip abduction    Hip adduction    Hip internal rotation    Hip external rotation    Knee flexion  3-  Knee extension  2  Ankle dorsiflexion    Ankle plantarflexion    Ankle inversion    Ankle eversion     (Blank rows = not tested) FUNCTIONAL TESTS:  Timed up  and go (TUG): 51 seconds  GAIT: Distance walked: 50 feet Assistive device utilized: Walker - 2 wheeled Level of assistance: SBA Comments: slow and guarded, small steps   TODAY'S TREATMENT:                                                                                                                              DATE:   11/01/22 NuStep L3 x 6 min L knee PROM flex and Ext LAQ 2x10 assist needed at times   HS curls red LLE 2x10 Standing march In RW  S2S slighty elevated mat HHAx2 2x5   PATIENT EDUCATION:  Education details: HEP/POC Person educated: Patient Education method: Programmer, multimedia, Facilities manager, Verbal cues, and Handouts Education comprehension: verbalized understanding  HOME EXERCISE PROGRAM: Access Code: [email or web address] URL: [email or web address] Date: 10/27/2022 Prepared by: Stacie Glaze  Exercises - Seated March  - 2 x daily - 7 x weekly - 1 sets - 10 reps - 3 hold - Seated Long Arc Quad  - 2 x daily - 7 x weekly - 1 sets - 10 reps - 3 hold - Seated Heel Raise  - 2 x daily - 7 x weekly - 1 sets - 10 reps - 3 hold - Seated Knee Flexion Stretch  - 1 x daily - 7 x weekly - 1 sets - 10 reps - 15 hold  ASSESSMENT:  CLINICAL IMPRESSION: Patient is a 89 y.o. female who was seen today for physical therapy  treatment for s/p left TKA on 10/17/22.  She is very sore, has significant swelling.  Pt tolerated an initial progression to TE well. She had good PROM considering post op time. Assist needed with LAQ due t weakness from decrease muscle activation, this improved as reps increased. Limited HS ROM initially that increased as reps progressed.  Pt did have some increase in discomfort with standing march and S2S   OBJECTIVE IMPAIRMENTS: Abnormal gait, cardiopulmonary status limiting activity, decreased activity tolerance, decreased balance, decreased endurance, decreased mobility, difficulty walking, decreased ROM, decreased strength, increased edema, impaired  flexibility, improper body mechanics, and pain.   REHAB POTENTIAL: Good  CLINICAL DECISION MAKING: Stable/uncomplicated  EVALUATION COMPLEXITY: Low   GOALS: Goals reviewed with patient? Yes  SHORT TERM GOALS: Target date: 11/11/22 Independent with initial HEP Goal status: INITIAL  LONG TERM GOALS: Target date: 01/27/23  Independent with advanced HEP Goal status: INITIAL  2.  Decrease pain 50% Goal status: INITIAL  3.  Increase AROM to 10-110 degrees flexion Goal status: INITIAL  4.  Go up and down stairs safely step over step with hand rails Goal status: INITIAL  5.  Decrease swelling by 4cm Goal status: INITIAL  6.  Decrease TUG to 24 seconds Goal status: INITIAL   PLAN:  PT FREQUENCY: 1-2x/week  PT DURATION: 12 weeks  PLANNED INTERVENTIONS: Therapeutic exercises, Therapeutic activity, Neuromuscular re-education, Balance training, Gait training, Patient/Family education, Self Care, Joint mobilization, Joint manipulation, Stair training, Electrical stimulation, Vasopneumatic device, and Manual therapy  PLAN FOR NEXT SESSION: slowly start activities for strength, ROM, function and gait   Grayce Sessions, PTA 11/01/2022, 4:05 PM

## 2022-11-03 ENCOUNTER — Ambulatory Visit: Payer: Medicare Other | Attending: Orthopedic Surgery | Admitting: Physical Therapy

## 2022-11-03 ENCOUNTER — Encounter: Payer: Self-pay | Admitting: Physical Therapy

## 2022-11-03 DIAGNOSIS — R262 Difficulty in walking, not elsewhere classified: Secondary | ICD-10-CM | POA: Diagnosis present

## 2022-11-03 DIAGNOSIS — M25662 Stiffness of left knee, not elsewhere classified: Secondary | ICD-10-CM | POA: Diagnosis present

## 2022-11-03 DIAGNOSIS — M6281 Muscle weakness (generalized): Secondary | ICD-10-CM | POA: Diagnosis present

## 2022-11-03 DIAGNOSIS — M25562 Pain in left knee: Secondary | ICD-10-CM | POA: Diagnosis present

## 2022-11-03 DIAGNOSIS — R600 Localized edema: Secondary | ICD-10-CM | POA: Insufficient documentation

## 2022-11-03 NOTE — Therapy (Signed)
OUTPATIENT PHYSICAL THERAPY LOWER EXTREMITY EVALUATION   Patient Name: [name] MRN: [id number] DOB:Jul 09, 1932, 89 y.o., female Today's Date: 11/03/2022  END OF SESSION:  PT End of Session - 11/03/22 1600     Visit Number 3    Date for PT Re-Evaluation 01/27/23    PT Start Time 1600    PT Stop Time 1645    PT Time Calculation (min) 45 min    Activity Tolerance Patient tolerated treatment well    Behavior During Therapy [facility] for tasks assessed/performed             Past Medical History:  Diagnosis Date   Acalculous cholecystitis 08/24/2018   Arthritis    DVT of lower extremity (deep venous thrombosis) (HCC)    right leg - after back surgery in 2019   GERD (gastroesophageal reflux disease)    History of blood transfusion    Hypothyroidism    PONV (postoperative nausea and vomiting)    Ptosis of both eyelids    Vertigo    Wears glasses    reading   Past Surgical History:  Procedure Laterality Date   ABDOMINAL HYSTERECTOMY  1986   APPLICATION OF ROBOTIC ASSISTANCE FOR SPINAL PROCEDURE N/A 07/17/2017   Procedure: APPLICATION OF ROBOTIC ASSISTANCE FOR SPINAL PROCEDURE;  Surgeon: Barnett Abu, MD;  Location: [facility];  Service: Neurosurgery;  Laterality: N/A;   APPLICATION OF ROBOTIC ASSISTANCE FOR SPINAL PROCEDURE N/A 09/01/2017   Procedure: APPLICATION OF ROBOTIC ASSISTANCE FOR SPINAL PROCEDURE;  Surgeon: Barnett Abu, MD;  Location: [facility] OR;  Service: Neurosurgery;  Laterality: N/A;   APPLICATION OF ROBOTIC ASSISTANCE FOR SPINAL PROCEDURE N/A 10/27/2020   Procedure: APPLICATION OF ROBOTIC ASSISTANCE FOR SPINAL PROCEDURE;  Surgeon: Barnett Abu, MD;  [facility]: [facility];  Service: Neurosurgery;  Laterality: N/A;   BACK SURGERY  1986   lumb lam   BREAST BIOPSY Right 2011   CARPAL TUNNEL RELEASE  2000   right   CARPAL TUNNEL RELEASE  2012   left   CHOLECYSTECTOMY N/A 08/24/2018   Procedure: LAPAROSCOPIC CHOLECYSTECTOMY WITH INTRAOPERATIVE CHOLANGIOGRAM;  Surgeon:  Claud Kelp, MD;  Location: [facility] OR;  Service: General;  Laterality: N/A;   COLONOSCOPY     CYSTOCELE REPAIR  2007   sling   DISTAL INTERPHALANGEAL JOINT FUSION Right 02/04/2014   Procedure: FUSION DISTAL INTERPHALANGEAL JOINT RIGHT INDEX FINGER ;  Surgeon: Cindee Salt, MD;  Location: [facility];  Service: Orthopedics;  Laterality: Right;   EYE SURGERY Bilateral    Cataract surgery with lens implant   KNEE SURGERY  2009,2011   partial knee   LAPAROSCOPY N/A 08/24/2018   Procedure: LAPAROSCOPY DIAGNOSTIC;  Surgeon: Claud Kelp, MD;  Location: [facility] OR;  Service: General;  Laterality: N/A;   LUMBAR PERCUTANEOUS PEDICLE SCREW 4 LEVEL N/A 10/27/2020   Procedure: Thoracic nine-ten Laminectomy with extension of fusion from Thoracic ten to Thoracic six with segmental fixation (cemented) with pedicle screw and mazor;  Surgeon: Barnett Abu, MD;  Location: [facility] OR;  Service: Neurosurgery;  Laterality: N/A;   OPEN REDUCTION INTERNAL FIXATION (ORIF) PROXIMAL PHALANX Left 08/30/2013   Procedure: OPEN REDUCTION INTERNAL FIXATION (ORIF) PROXIMAL PHALANX FRACTURE LEFT SMALL FINGER; SPLINT RING FINGER;  Surgeon: Nicki Reaper, MD;  Location: [facility];  Service: Orthopedics;  Laterality: Left;   POSTERIOR LUMBAR FUSION 4 LEVEL N/A 07/17/2017   Procedure: Thoracic Ten - Lumbar Five revison of hardware with Mazor;  Surgeon: Barnett Abu, MD;  [facility]: [facility];  Service: Neurosurgery;  Laterality: N/A;  Thoracic/Lumbar   PTOSIS REPAIR Bilateral 07/27/2015   Procedure: PTOSIS REPAIR;  Surgeon: Louisa Second, MD;  Location: [facility];  Service: Plastics;  Laterality: Bilateral;   SHOULDER SURGERY     rt rcr,and lt   TENOLYSIS Left 05/21/2021   Procedure: RELEASE EXTENSOR POLLICIS LONGUS LEFT;  Surgeon: Cindee Salt, MD;  Location: [facility];  Service: Orthopedics;  Laterality: Left;   TENOLYSIS Left 06/08/2021   Procedure: RELEASE EXTENSOR POLLICIS  LONGUS LEFT;  Surgeon: Cindee Salt, MD;  Location: [facility];  Service: Orthopedics;  Laterality: Left;   TONSILLECTOMY     TOTAL KNEE ARTHROPLASTY Right 12/23/2019   Procedure: TOTAL KNEE ARTHROPLASTY;  Surgeon: Dannielle Huh, MD;  Location: [facility];  Service: Orthopedics;  Laterality: Right;   TRIGGER FINGER RELEASE Left 05/21/2021   Procedure: RELEASE TRIGGER FINGER/A-1 PULLEY LEFT MIDDLE FINGER;  Surgeon: Cindee Salt, MD;  Location: [facility];  Service: Orthopedics;  Laterality: Left;   TRIGGER FINGER RELEASE Left 06/08/2021   Procedure: RELEASE A-1 PULLEY LEFT MIDDLE FINGER;  Surgeon: Cindee Salt, MD;  Location: [facility];  Service: Orthopedics;  Laterality: Left;   Patient Active Problem List   Diagnosis Date Noted   Myelopathy concurrent with and due to spinal stenosis of thoracic region (HCC) 10/27/2020   S/P total knee arthroplasty, right 12/23/2019   Acalculous cholecystitis 08/24/2018   Osteoarthritis of right knee    Vertigo    DDD (degenerative disc disease), cervical    Benign essential HTN    History of DVT (deep vein thrombosis)    Tachycardia    Steroid-induced hyperglycemia    Hypothyroidism    Neuropathic pain    Acute blood loss anemia    S/P lumbar fusion    Closed L5 vertebral fracture (HCC) 08/31/2017   Closed fracture of fifth lumbar vertebra (HCC) 08/30/2017   Lumbar vertebral fracture (HCC) 07/17/2017   Scoliosis 06/13/2017    PCP: Burton Apley, MD  REFERRING PROVIDER: Sherlean Foot, MD  REFERRING DIAG: S/P left TKA  THERAPY DIAG:  Acute pain of left knee  Difficulty in walking, not elsewhere classified  Localized edema  Stiffness of left knee, not elsewhere classified  Muscle weakness (generalized)  Rationale for Evaluation and Treatment: Rehabilitation  ONSET DATE: 10/17/22  SUBJECTIVE:   SUBJECTIVE STATEMENT: Lousy, I just don't feel good   PERTINENT HISTORY: See above PAIN:  Are you having  pain? Yes: NPRS scale: 8/10 Pain location: left knee Pain description: ache, deep Aggravating factors: night, bending, being up on the leg pain a 10/10 Relieving factors: sit down, elevate, pain medication, ice pain can be 0/10  PRECAUTIONS: None  RED FLAGS: None   WEIGHT BEARING RESTRICTIONS: No  FALLS:  Has patient fallen in last 6 months? No  LIVING ENVIRONMENT: Lives with: lives with their family and lives with their spouse Lives in: House/apartment Stairs: Yes: Internal: 12 steps; can reach both Has following equipment at home: Walker - 4 wheeled, Shower bench, and bed side commode  OCCUPATION: retired  PLOF: Independent and was using a SPC, she was doing some cooking and cleaning  PATIENT GOALS: have good ROM, less pain, walk better, go up and down stairs  NEXT MD VISIT: 11/30/22  OBJECTIVE:   DIAGNOSTIC FINDINGS: s/p left TKA  PATIENT SURVEYS:  FOTO 6.5  COGNITION: Overall cognitive status: Within functional limits for tasks assessed     SENSATION: WFL Numbness laterally  EDEMA:  Circumferential: right mid  patella 46 cm, left 55 cm  PALPATION: Very swollen in the knee, the shin and the foot, very tender to palpation in the thigh, knee and shin, very sensitive where the tourniquet was  LOWER EXTREMITY ROM:  Active ROM Left AROM eval Left PROM eval  Hip flexion    Hip extension    Hip abduction    Hip adduction    Hip internal rotation    Hip external rotation    Knee flexion 80 85  Knee extension 27 20  Ankle dorsiflexion    Ankle plantarflexion    Ankle inversion    Ankle eversion     (Blank rows = not tested)  LOWER EXTREMITY MMT:  MMT Right eval Left eval  Hip flexion    Hip extension    Hip abduction    Hip adduction    Hip internal rotation    Hip external rotation    Knee flexion  3-  Knee extension  2  Ankle dorsiflexion    Ankle plantarflexion    Ankle inversion    Ankle eversion     (Blank rows = not  tested) FUNCTIONAL TESTS:  Timed up and go (TUG): 51 seconds  GAIT: Distance walked: 50 feet Assistive device utilized: Walker - 2 wheeled Level of assistance: SBA Comments: slow and guarded, small steps   TODAY'S TREATMENT:                                                                                                                              DATE:   11/03/22 NuStep L3 x6 min L knee PROM flex and Ext HS curls red LLE 2x12 Leg Ext x10, x5 some assist with 2nd set Fitter press 1 blue LLE 2x15 S2S slighty elevated mat HHAx2 2x510 LLE SAQ 2x10  11/01/22 NuStep L3 x 6 min L knee PROM flex and Ext LAQ 2x10 assist needed at times   HS curls red LLE 2x10 Standing march In RW  S2S slighty elevated mat HHAx2 2x5   PATIENT EDUCATION:  Education details: HEP/POC Person educated: Patient Education method: Programmer, multimedia, Facilities manager, Verbal cues, and Handouts Education comprehension: verbalized understanding  HOME EXERCISE PROGRAM: Access Code: [email or web address] URL: [email or web address] Date: 10/27/2022 Prepared by: Stacie Glaze  Exercises - Seated March  - 2 x daily - 7 x weekly - 1 sets - 10 reps - 3 hold - Seated Long Arc Quad  - 2 x daily - 7 x weekly - 1 sets - 10 reps - 3 hold - Seated Heel Raise  - 2 x daily - 7 x weekly - 1 sets - 10 reps - 3 hold - Seated Knee Flexion Stretch  - 1 x daily - 7 x weekly - 1 sets - 10 reps - 15 hold  ASSESSMENT:  CLINICAL IMPRESSION: Patient is a 89 y.o. female who was seen today for physical therapy  treatment for s/p left TKA on 10/17/22.  She is very sore, has significant  swelling.  Continued with L knee PROM and light strengthening. She had good PROM considering post op time. Less assist required today with LAQ. Pt did well with SAQ.  Limited HS ROM initially that increased as reps progressed. Compensation noted with sit to stands.  OBJECTIVE IMPAIRMENTS: Abnormal gait, cardiopulmonary status limiting activity, decreased  activity tolerance, decreased balance, decreased endurance, decreased mobility, difficulty walking, decreased ROM, decreased strength, increased edema, impaired flexibility, improper body mechanics, and pain.   REHAB POTENTIAL: Good  CLINICAL DECISION MAKING: Stable/uncomplicated  EVALUATION COMPLEXITY: Low   GOALS: Goals reviewed with patient? Yes  SHORT TERM GOALS: Target date: 11/11/22 Independent with initial HEP Goal status: INITIAL  LONG TERM GOALS: Target date: 01/27/23  Independent with advanced HEP Goal status: INITIAL  2.  Decrease pain 50% Goal status: INITIAL  3.  Increase AROM to 10-110 degrees flexion Goal status: INITIAL  4.  Go up and down stairs safely step over step with hand rails Goal status: INITIAL  5.  Decrease swelling by 4cm Goal status: INITIAL  6.  Decrease TUG to 24 seconds Goal status: INITIAL   PLAN:  PT FREQUENCY: 1-2x/week  PT DURATION: 12 weeks  PLANNED INTERVENTIONS: Therapeutic exercises, Therapeutic activity, Neuromuscular re-education, Balance training, Gait training, Patient/Family education, Self Care, Joint mobilization, Joint manipulation, Stair training, Electrical stimulation, Vasopneumatic device, and Manual therapy  PLAN FOR NEXT SESSION: slowly start activities for strength, ROM, function and gait   Grayce Sessions, PTA 11/03/2022, 4:00 PM

## 2022-11-08 ENCOUNTER — Ambulatory Visit: Payer: Medicare Other | Admitting: Physical Therapy

## 2022-11-08 ENCOUNTER — Encounter: Payer: Self-pay | Admitting: Physical Therapy

## 2022-11-08 DIAGNOSIS — M25562 Pain in left knee: Secondary | ICD-10-CM | POA: Diagnosis not present

## 2022-11-08 DIAGNOSIS — R262 Difficulty in walking, not elsewhere classified: Secondary | ICD-10-CM

## 2022-11-08 DIAGNOSIS — M6281 Muscle weakness (generalized): Secondary | ICD-10-CM

## 2022-11-08 DIAGNOSIS — R600 Localized edema: Secondary | ICD-10-CM

## 2022-11-08 DIAGNOSIS — M25662 Stiffness of left knee, not elsewhere classified: Secondary | ICD-10-CM

## 2022-11-08 NOTE — Therapy (Signed)
OUTPATIENT PHYSICAL THERAPY LOWER EXTREMITY TREATMENT   Patient Name: [name] MRN: [id number] DOB:25-May-1932, 89 y.o., female Today's Date: 11/08/2022  END OF SESSION:  PT End of Session - 11/08/22 1614     Visit Number 4    Date for PT Re-Evaluation 01/27/23    Authorization Type [employer] Medicare    PT Start Time 1605    PT Stop Time 1700    PT Time Calculation (min) 55 min    Activity Tolerance Patient tolerated treatment well    Behavior During Therapy WFL for tasks assessed/performed             Past Medical History:  Diagnosis Date   Acalculous cholecystitis 08/24/2018   Arthritis    DVT of lower extremity (deep venous thrombosis) (HCC)    right leg - after back surgery in 2019   GERD (gastroesophageal reflux disease)    History of blood transfusion    Hypothyroidism    PONV (postoperative nausea and vomiting)    Ptosis of both eyelids    Vertigo    Wears glasses    reading   Past Surgical History:  Procedure Laterality Date   ABDOMINAL HYSTERECTOMY  1986   APPLICATION OF ROBOTIC ASSISTANCE FOR SPINAL PROCEDURE N/A 07/17/2017   Procedure: APPLICATION OF ROBOTIC ASSISTANCE FOR SPINAL PROCEDURE;  Surgeon: Barnett Abu, MD;  [facility];  Service: Neurosurgery;  Laterality: N/A;   APPLICATION OF ROBOTIC ASSISTANCE FOR SPINAL PROCEDURE N/A 09/01/2017   Procedure: APPLICATION OF ROBOTIC ASSISTANCE FOR SPINAL PROCEDURE;  Surgeon: Barnett Abu, MD;  [facility];  Service: Neurosurgery;  Laterality: N/A;   APPLICATION OF ROBOTIC ASSISTANCE FOR SPINAL PROCEDURE N/A 10/27/2020   Procedure: APPLICATION OF ROBOTIC ASSISTANCE FOR SPINAL PROCEDURE;  Surgeon: Barnett Abu, MD;  [facility];  Service: Neurosurgery;  Laterality: N/A;   BACK SURGERY  1986   lumb lam   BREAST BIOPSY Right 2011   CARPAL TUNNEL RELEASE  2000   right   CARPAL TUNNEL RELEASE  2012   left   CHOLECYSTECTOMY N/A 08/24/2018   Procedure: LAPAROSCOPIC CHOLECYSTECTOMY WITH  INTRAOPERATIVE CHOLANGIOGRAM;  Surgeon: Claud Kelp, MD;  Location: [facility] OR;  Service: General;  Laterality: N/A;   COLONOSCOPY     CYSTOCELE REPAIR  2007   sling   DISTAL INTERPHALANGEAL JOINT FUSION Right 02/04/2014   Procedure: FUSION DISTAL INTERPHALANGEAL JOINT RIGHT INDEX FINGER ;  Surgeon: Cindee Salt, MD;  Location: [facility];  Service: Orthopedics;  Laterality: Right;   EYE SURGERY Bilateral    Cataract surgery with lens implant   KNEE SURGERY  2009,2011   partial knee   LAPAROSCOPY N/A 08/24/2018   Procedure: LAPAROSCOPY DIAGNOSTIC;  Surgeon: Claud Kelp, MD;  Location: [facility] OR;  Service: General;  Laterality: N/A;   LUMBAR PERCUTANEOUS PEDICLE SCREW 4 LEVEL N/A 10/27/2020   Procedure: Thoracic nine-ten Laminectomy with extension of fusion from Thoracic ten to Thoracic six with segmental fixation (cemented) with pedicle screw and mazor;  Surgeon: Barnett Abu, MD;  Location: [facility] OR;  Service: Neurosurgery;  Laterality: N/A;   OPEN REDUCTION INTERNAL FIXATION (ORIF) PROXIMAL PHALANX Left 08/30/2013   Procedure: OPEN REDUCTION INTERNAL FIXATION (ORIF) PROXIMAL PHALANX FRACTURE LEFT SMALL FINGER; SPLINT RING FINGER;  Surgeon: Nicki Reaper, MD;  Location: [facility];  Service: Orthopedics;  Laterality: Left;   POSTERIOR LUMBAR FUSION 4 LEVEL N/A 07/17/2017   Procedure: Thoracic Ten - Lumbar Five revison of hardware with Mazor;  Surgeon: Barnett Abu, MD;  Location[facility];  Service: Neurosurgery;  Laterality: N/A;  Thoracic/Lumbar   PTOSIS REPAIR Bilateral 07/27/2015   Procedure: PTOSIS REPAIR;  Surgeon: Louisa Second, MD;  Location: [facility];  Service: Plastics;  Laterality: Bilateral;   SHOULDER SURGERY     rt rcr,and lt   TENOLYSIS Left 05/21/2021   Procedure: RELEASE EXTENSOR POLLICIS LONGUS LEFT;  Surgeon: Cindee Salt, MD;  Location: [facility];  Service: Orthopedics;  Laterality: Left;   TENOLYSIS Left 06/08/2021    Procedure: RELEASE EXTENSOR POLLICIS LONGUS LEFT;  Surgeon: Cindee Salt, MD;  Location: [facility];  Service: Orthopedics;  Laterality: Left;   TONSILLECTOMY     TOTAL KNEE ARTHROPLASTY Right 12/23/2019   Procedure: TOTAL KNEE ARTHROPLASTY;  Surgeon: Dannielle Huh, MD;  Location: [facility];  Service: Orthopedics;  Laterality: Right;   TRIGGER FINGER RELEASE Left 05/21/2021   Procedure: RELEASE TRIGGER FINGER/A-1 PULLEY LEFT MIDDLE FINGER;  Surgeon: Cindee Salt, MD;  Location: [facility];  Service: Orthopedics;  Laterality: Left;   TRIGGER FINGER RELEASE Left 06/08/2021   Procedure: RELEASE A-1 PULLEY LEFT MIDDLE FINGER;  Surgeon: Cindee Salt, MD;  Location:  [facility];  Service: Orthopedics;  Laterality: Left;   Patient Active Problem List   Diagnosis Date Noted   Myelopathy concurrent with and due to spinal stenosis of thoracic region (HCC) 10/27/2020   S/P total knee arthroplasty, right 12/23/2019   Acalculous cholecystitis 08/24/2018   Osteoarthritis of right knee    Vertigo    DDD (degenerative disc disease), cervical    Benign essential HTN    History of DVT (deep vein thrombosis)    Tachycardia    Steroid-induced hyperglycemia    Hypothyroidism    Neuropathic pain    Acute blood loss anemia    S/P lumbar fusion    Closed L5 vertebral fracture (HCC) 08/31/2017   Closed fracture of fifth lumbar vertebra (HCC) 08/30/2017   Lumbar vertebral fracture (HCC) 07/17/2017   Scoliosis 06/13/2017    PCP: Burton Apley, MD  REFERRING PROVIDER: Sherlean Foot, MD  REFERRING DIAG: S/P left TKA  THERAPY DIAG:  Acute pain of left knee  Difficulty in walking, not elsewhere classified  Localized edema  Stiffness of left knee, not elsewhere classified  Muscle weakness (generalized)  Rationale for Evaluation and Treatment: Rehabilitation  ONSET DATE: 10/17/22  SUBJECTIVE:   SUBJECTIVE STATEMENT: I have hurt from the waist down on both legs, I  called the doctor  PERTINENT HISTORY: See above PAIN:  Are you having pain? Yes: NPRS scale: 8/10 Pain location: left knee Pain description: ache, deep Aggravating factors: night, bending, being up on the leg pain a 10/10 Relieving factors: sit down, elevate, pain medication, ice pain can be 0/10  PRECAUTIONS: None  RED FLAGS: None   WEIGHT BEARING RESTRICTIONS: No  FALLS:  Has patient fallen in last 6 months? No  LIVING ENVIRONMENT: Lives with: lives with their family and lives with their spouse Lives in: House/apartment Stairs: Yes: Internal: 12 steps; can reach both Has following equipment at home: Walker - 4 wheeled, Shower bench, and bed side commode  OCCUPATION: retired  PLOF: Independent and was using a SPC, she was doing some cooking and cleaning  PATIENT GOALS: have good ROM, less pain, walk better, go up and down stairs  NEXT MD VISIT: 11/30/22  OBJECTIVE:   DIAGNOSTIC FINDINGS: s/p left TKA  PATIENT SURVEYS:  FOTO 6.5  COGNITION: Overall cognitive status: Within functional limits for tasks assessed  SENSATION: WFL Numbness laterally  EDEMA:  Circumferential: right mid patella 46 cm, left 55 cm  PALPATION: Very swollen in the knee, the shin and the foot, very tender to palpation in the thigh, knee and shin, very sensitive where the tourniquet was  LOWER EXTREMITY ROM:  Active ROM Left AROM eval Left PROM eval  Hip flexion    Hip extension    Hip abduction    Hip adduction    Hip internal rotation    Hip external rotation    Knee flexion 80 85  Knee extension 27 20  Ankle dorsiflexion    Ankle plantarflexion    Ankle inversion    Ankle eversion     (Blank rows = not tested)  LOWER EXTREMITY MMT:  MMT Right eval Left eval  Hip flexion    Hip extension    Hip abduction    Hip adduction    Hip internal rotation    Hip external rotation    Knee flexion  3-  Knee extension  2  Ankle dorsiflexion    Ankle plantarflexion     Ankle inversion    Ankle eversion     (Blank rows = not tested) FUNCTIONAL TESTS:  Timed up and go (TUG): 51 seconds  GAIT: Distance walked: 50 feet Assistive device utilized: Environmental consultant - 2 wheeled Level of assistance: SBA Comments: slow and guarded, small steps   TODAY'S TREATMENT:                                                                                                                              DATE:   11/08/22 Nustep level 4 x 6 minutes SAQ 2# Feet on ball K2C, trunk rotation, small bridge Stairs one at a time up and down 4" and 6" Red tband HS curls PROM of the left knee, gentle STM and scar massage left knee Ball b/n knees squeeze Red tband clamshells Vaso High pressure in elevation 36 degrees F  11/03/22 NuStep L3 x6 min L knee PROM flex and Ext HS curls red LLE 2x12 Leg Ext x10, x5 some assist with 2nd set Fitter press 1 blue LLE 2x15 S2S slighty elevated mat HHAx2 2x510 LLE SAQ 2x10  11/01/22 NuStep L3 x 6 min L knee PROM flex and Ext LAQ 2x10 assist needed at times   HS curls red LLE 2x10 Standing march In RW  S2S slighty elevated mat HHAx2 2x5   PATIENT EDUCATION:  Education details: HEP/POC Person educated: Patient Education method: Programmer, multimedia, Facilities manager, Verbal cues, and Handouts Education comprehension: verbalized understanding  HOME EXERCISE PROGRAM: Access Code: [email or web address] URL: [email or web address] Date: 10/27/2022 Prepared by: Stacie Glaze  Exercises - Seated March  - 2 x daily - 7 x weekly - 1 sets - 10 reps - 3 hold - Seated Long Arc Quad  - 2 x daily - 7 x weekly - 1 sets - 10 reps - 3 hold - Seated Heel Raise  - 2 x  daily - 7 x weekly - 1 sets - 10 reps - 3 hold - Seated Knee Flexion Stretch  - 1 x daily - 7 x weekly - 1 sets - 10 reps - 15 hold  ASSESSMENT:  CLINICAL IMPRESSION: Patient is a 89 y.o. female who was seen today for physical therapy  treatment for s/p left TKA on 10/17/22.  She continues to have  significant swelling and reports pain in the legs.  She has a great deal of difficulty getting the first rep of any exercises due to swelling and difficulty activating the mms, once she gets moving she does well.   I really tried to emphasize the elevation and ice to help the swelling.  OBJECTIVE IMPAIRMENTS: Abnormal gait, cardiopulmonary status limiting activity, decreased activity tolerance, decreased balance, decreased endurance, decreased mobility, difficulty walking, decreased ROM, decreased strength, increased edema, impaired flexibility, improper body mechanics, and pain.   REHAB POTENTIAL: Good  CLINICAL DECISION MAKING: Stable/uncomplicated  EVALUATION COMPLEXITY: Low   GOALS: Goals reviewed with patient? Yes  SHORT TERM GOALS: Target date: 11/11/22 Independent with initial HEP Goal status: met 11/08/22  LONG TERM GOALS: Target date: 01/27/23  Independent with advanced HEP Goal status: INITIAL  2.  Decrease pain 50% Goal status:ongoing 11/08/22  3.  Increase AROM to 10-110 degrees flexion Goal status: INITIAL  4.  Go up and down stairs safely step over step with hand rails Goal status: ongoing 11/08/22  5.  Decrease swelling by 4cm Goal status: INITIAL  6.  Decrease TUG to 24 seconds Goal status: INITIAL   PLAN:  PT FREQUENCY: 1-2x/week  PT DURATION: 12 weeks  PLANNED INTERVENTIONS: Therapeutic exercises, Therapeutic activity, Neuromuscular re-education, Balance training, Gait training, Patient/Family education, Self Care, Joint mobilization, Joint manipulation, Stair training, Electrical stimulation, Vasopneumatic device, and Manual therapy  PLAN FOR NEXT SESSION:  strength, ROM, function and gait   , W, PT 11/08/2022, 4:47 PM

## 2022-11-10 ENCOUNTER — Encounter: Payer: Self-pay | Admitting: Physical Therapy

## 2022-11-10 ENCOUNTER — Ambulatory Visit: Payer: Medicare Other | Admitting: Physical Therapy

## 2022-11-10 DIAGNOSIS — M6281 Muscle weakness (generalized): Secondary | ICD-10-CM

## 2022-11-10 DIAGNOSIS — R262 Difficulty in walking, not elsewhere classified: Secondary | ICD-10-CM

## 2022-11-10 DIAGNOSIS — M25562 Pain in left knee: Secondary | ICD-10-CM

## 2022-11-10 DIAGNOSIS — M25662 Stiffness of left knee, not elsewhere classified: Secondary | ICD-10-CM

## 2022-11-10 DIAGNOSIS — R600 Localized edema: Secondary | ICD-10-CM

## 2022-11-10 NOTE — Therapy (Signed)
OUTPATIENT PHYSICAL THERAPY LOWER EXTREMITY TREATMENT   Patient Name: [name] MRN: [id number] DOB:[birth year], 89 y.o., female Today's Date: 11/10/2022  END OF SESSION:  PT End of Session - 11/10/22 1557     Visit Number 5    Date for PT Re-Evaluation 01/27/23    Authorization Type [employer]    PT Start Time 1600    PT Stop Time 1645    PT Time Calculation (min) 45 min    Activity Tolerance Patient tolerated treatment well    Behavior During Therapy WFL for tasks assessed/performed             Past Medical History:  Diagnosis Date   Acalculous cholecystitis 08/24/2018   Arthritis    DVT of lower extremity (deep venous thrombosis) (HCC)    right leg - after back surgery in 2019   GERD (gastroesophageal reflux disease)    History of blood transfusion    Hypothyroidism    PONV (postoperative nausea and vomiting)    Ptosis of both eyelids    Vertigo    Wears glasses    reading   Past Surgical History:  Procedure Laterality Date   ABDOMINAL HYSTERECTOMY  1986   APPLICATION OF ROBOTIC ASSISTANCE FOR SPINAL PROCEDURE N/A 07/17/2017   Procedure: APPLICATION OF ROBOTIC ASSISTANCE FOR SPINAL PROCEDURE;  Surgeon: Barnett Abu, MD;  [facility];  Service: Neurosurgery;  Laterality: N/A;   APPLICATION OF ROBOTIC ASSISTANCE FOR SPINAL PROCEDURE N/A 09/01/2017   Procedure: APPLICATION OF ROBOTIC ASSISTANCE FOR SPINAL PROCEDURE;  Surgeon: Barnett Abu, MD;  [facility];  Service: Neurosurgery;  Laterality: N/A;   APPLICATION OF ROBOTIC ASSISTANCE FOR SPINAL PROCEDURE N/A 10/27/2020   Procedure: APPLICATION OF ROBOTIC ASSISTANCE FOR SPINAL PROCEDURE;  Surgeon: Barnett Abu, MD;  [facility];  Service: Neurosurgery;  Laterality: N/A;   BACK SURGERY  1986   lumb lam   BREAST BIOPSY Right 2011   CARPAL TUNNEL RELEASE  2000   right   CARPAL TUNNEL RELEASE  2012   left   CHOLECYSTECTOMY N/A 08/24/2018   Procedure: LAPAROSCOPIC CHOLECYSTECTOMY WITH  INTRAOPERATIVE CHOLANGIOGRAM;  Surgeon: Claud Kelp, MD;  Location: [facility] OR;  Service: General;  Laterality: N/A;   COLONOSCOPY     CYSTOCELE REPAIR  2007   sling   DISTAL INTERPHALANGEAL JOINT FUSION Right 02/04/2014   Procedure: FUSION DISTAL INTERPHALANGEAL JOINT RIGHT INDEX FINGER ;  Surgeon: Cindee Salt, MD;  Location: [facility];  Service: Orthopedics;  Laterality: Right;   EYE SURGERY Bilateral    Cataract surgery with lens implant   KNEE SURGERY  2009,2011   partial knee   LAPAROSCOPY N/A 08/24/2018   Procedure: LAPAROSCOPY DIAGNOSTIC;  Surgeon: Claud Kelp, MD;  Location: [facility] OR;  Service: General;  Laterality: N/A;   LUMBAR PERCUTANEOUS PEDICLE SCREW 4 LEVEL N/A 10/27/2020   Procedure: Thoracic nine-ten Laminectomy with extension of fusion from Thoracic ten to Thoracic six with segmental fixation (cemented) with pedicle screw and mazor;  Surgeon: Barnett Abu, MD;  Location: [facility] OR;  Service: Neurosurgery;  Laterality: N/A;   OPEN REDUCTION INTERNAL FIXATION (ORIF) PROXIMAL PHALANX Left 08/30/2013   Procedure: OPEN REDUCTION INTERNAL FIXATION (ORIF) PROXIMAL PHALANX FRACTURE LEFT SMALL FINGER; SPLINT RING FINGER;  Surgeon: Nicki Reaper, MD;  Location: [facility];  Service: Orthopedics;  Laterality: Left;   POSTERIOR LUMBAR FUSION 4 LEVEL N/A 07/17/2017   Procedure: Thoracic Ten - Lumbar Five revison of hardware with Mazor;  Surgeon: Barnett Abu, MD;  [facility];  Service: Neurosurgery;  Laterality: N/A;  Thoracic/Lumbar   PTOSIS REPAIR Bilateral 07/27/2015   Procedure: PTOSIS REPAIR;  Surgeon: Louisa Second, MD;  Location: [facility];  Service: Plastics;  Laterality: Bilateral;   SHOULDER SURGERY     rt rcr,and lt   TENOLYSIS Left 05/21/2021   Procedure: RELEASE EXTENSOR POLLICIS LONGUS LEFT;  Surgeon: Cindee Salt, MD;  Location: [facility];  Service: Orthopedics;  Laterality: Left;   TENOLYSIS Left 06/08/2021    Procedure: RELEASE EXTENSOR POLLICIS LONGUS LEFT;  Surgeon: Cindee Salt, MD;  Location: [facility];  Service: Orthopedics;  Laterality: Left;   TONSILLECTOMY     TOTAL KNEE ARTHROPLASTY Right 12/23/2019   Procedure: TOTAL KNEE ARTHROPLASTY;  Surgeon: Dannielle Huh, MD;  Location: [facility];  Service: Orthopedics;  Laterality: Right;   TRIGGER FINGER RELEASE Left 05/21/2021   Procedure: RELEASE TRIGGER FINGER/A-1 PULLEY LEFT MIDDLE FINGER;  Surgeon: Cindee Salt, MD;  Location: [facility];  Service: Orthopedics;  Laterality: Left;   TRIGGER FINGER RELEASE Left 06/08/2021   Procedure: RELEASE A-1 PULLEY LEFT MIDDLE FINGER;  Surgeon: Cindee Salt, MD;  Location: [facility];  Service: Orthopedics;  Laterality: Left;   Patient Active Problem List   Diagnosis Date Noted   Myelopathy concurrent with and due to spinal stenosis of thoracic region (HCC) 10/27/2020   S/P total knee arthroplasty, right 12/23/2019   Acalculous cholecystitis 08/24/2018   Osteoarthritis of right knee    Vertigo    DDD (degenerative disc disease), cervical    Benign essential HTN    History of DVT (deep vein thrombosis)    Tachycardia    Steroid-induced hyperglycemia    Hypothyroidism    Neuropathic pain    Acute blood loss anemia    S/P lumbar fusion    Closed L5 vertebral fracture (HCC) 08/31/2017   Closed fracture of fifth lumbar vertebra (HCC) 08/30/2017   Lumbar vertebral fracture (HCC) 07/17/2017   Scoliosis 06/13/2017    PCP: Burton Apley, MD  REFERRING PROVIDER: Sherlean Foot, MD  REFERRING DIAG: S/P left TKA  THERAPY DIAG:  Acute pain of left knee  Difficulty in walking, not elsewhere classified  Localized edema  Muscle weakness (generalized)  Stiffness of left knee, not elsewhere classified  Rationale for Evaluation and Treatment: Rehabilitation  ONSET DATE: 10/17/22  SUBJECTIVE:   SUBJECTIVE STATEMENT: Doing well today, stay up walking a  lot  PERTINENT HISTORY: See above PAIN:  Are you having pain? Yes: NPRS scale: 7/10 Pain location: left knee Pain description: ache, deep Aggravating factors: night, bending, being up on the leg pain a 10/10 Relieving factors: sit down, elevate, pain medication, ice pain can be 0/10  PRECAUTIONS: None  RED FLAGS: None   WEIGHT BEARING RESTRICTIONS: No  FALLS:  Has patient fallen in last 6 months? No  LIVING ENVIRONMENT: Lives with: lives with their family and lives with their spouse Lives in: House/apartment Stairs: Yes: Internal: 12 steps; can reach both Has following equipment at home: Walker - 4 wheeled, Shower bench, and bed side commode  OCCUPATION: retired  PLOF: Independent and was using a SPC, she was doing some cooking and cleaning  PATIENT GOALS: have good ROM, less pain, walk better, go up and down stairs  NEXT MD VISIT: 11/30/22  OBJECTIVE:   DIAGNOSTIC FINDINGS: s/p left TKA  PATIENT SURVEYS:  FOTO 6.5  COGNITION: Overall cognitive status: Within functional limits for tasks assessed     SENSATION: [facility]  Numbness laterally  EDEMA:  Circumferential: right mid patella 46 cm, left 55 cm  PALPATION: Very swollen in the knee, the shin and the foot, very tender to palpation in the thigh, knee and shin, very sensitive where the tourniquet was  LOWER EXTREMITY ROM:  Active ROM Left AROM eval Left PROM eval  Hip flexion    Hip extension    Hip abduction    Hip adduction    Hip internal rotation    Hip external rotation    Knee flexion 80 85  Knee extension 27 20  Ankle dorsiflexion    Ankle plantarflexion    Ankle inversion    Ankle eversion     (Blank rows = not tested)  LOWER EXTREMITY MMT:  MMT Right eval Left eval  Hip flexion    Hip extension    Hip abduction    Hip adduction    Hip internal rotation    Hip external rotation    Knee flexion  3-  Knee extension  2  Ankle dorsiflexion    Ankle plantarflexion    Ankle  inversion    Ankle eversion     (Blank rows = not tested) FUNCTIONAL TESTS:  Timed up and go (TUG): 51 seconds  GAIT: Distance walked: 50 feet Assistive device utilized: Walker - 2 wheeled Level of assistance: SBA Comments: slow and guarded, small steps   TODAY'S TREATMENT:                                                                                                                              DATE:   11/10/22 Nustep level 4 x 6 minutes HS curls green LLE 2x12 LAQ 2x10 LLE  PROM of the left knee, gentle STM and scar massage left knee S2S HHA x2 2x10 SAQ 2lb 2x10 Vaso High pressure in elevation 36 degrees F  11/08/22 Nustep level 4 x 6 minutes SAQ 2# Feet on ball K2C, trunk rotation, small bridge Stairs one at a time up and down 4" and 6" Red tband HS curls PROM of the left knee, gentle STM and scar massage left knee Ball b/n knees squeeze Red tband clamshells Vaso High pressure in elevation 36 degrees F  11/03/22 NuStep L3 x6 min L knee PROM flex and Ext HS curls red LLE 2x12 Leg Ext x10, x5 some assist with 2nd set Fitter press 1 blue LLE 2x15 S2S slighty elevated mat HHAx2 2x510 LLE SAQ 2x10  11/01/22 NuStep L3 x 6 min L knee PROM flex and Ext LAQ 2x10 assist needed at times   HS curls red LLE 2x10 Standing march In RW  S2S slighty elevated mat HHAx2 2x5   PATIENT EDUCATION:  Education details: HEP/POC Person educated: Patient Education method: Programmer, multimedia, Facilities manager, Verbal cues, and Handouts Education comprehension: verbalized understanding  HOME EXERCISE PROGRAM: Access Code: [email or web address] URL: [email or web address] Date: 10/27/2022 Prepared by: Stacie Glaze  Exercises - Seated March  - 2 x daily - 7  x weekly - 1 sets - 10 reps - 3 hold - Seated Long Arc Quad  - 2 x daily - 7 x weekly - 1 sets - 10 reps - 3 hold - Seated Heel Raise  - 2 x daily - 7 x weekly - 1 sets - 10 reps - 3 hold - Seated Knee Flexion Stretch  - 1 x daily - 7  x weekly - 1 sets - 10 reps - 15 hold  ASSESSMENT:  CLINICAL IMPRESSION: Patient is a 89 y.o. female who was seen today for physical therapy  treatment for s/p left TKA on 10/17/22.  She continues to have significant swelling and reports pain in the legs.  She has a great deal of difficulty getting the first rep of any exercises due to swelling and difficulty activating the mms, once she gets moving she does well. Pt reports a painful catch with Long and sort arc quads. Continues with ice and elevation to aid with swelling.  OBJECTIVE IMPAIRMENTS: Abnormal gait, cardiopulmonary status limiting activity, decreased activity tolerance, decreased balance, decreased endurance, decreased mobility, difficulty walking, decreased ROM, decreased strength, increased edema, impaired flexibility, improper body mechanics, and pain.   REHAB POTENTIAL: Good  CLINICAL DECISION MAKING: Stable/uncomplicated  EVALUATION COMPLEXITY: Low   GOALS: Goals reviewed with patient? Yes  SHORT TERM GOALS: Target date: 11/11/22 Independent with initial HEP Goal status: met 11/08/22  LONG TERM GOALS: Target date: 01/27/23  Independent with advanced HEP Goal status: INITIAL  2.  Decrease pain 50% Goal status:ongoing 11/08/22  3.  Increase AROM to 10-110 degrees flexion Goal status: INITIAL  4.  Go up and down stairs safely step over step with hand rails Goal status: ongoing 11/08/22  5.  Decrease swelling by 4cm Goal status: INITIAL  6.  Decrease TUG to 24 seconds Goal status: INITIAL   PLAN:  PT FREQUENCY: 1-2x/week  PT DURATION: 12 weeks  PLANNED INTERVENTIONS: Therapeutic exercises, Therapeutic activity, Neuromuscular re-education, Balance training, Gait training, Patient/Family education, Self Care, Joint mobilization, Joint manipulation, Stair training, Electrical stimulation, Vasopneumatic device, and Manual therapy  PLAN FOR NEXT SESSION:  strength, ROM, function and gait   Grayce Sessions,  PTA 11/10/2022, 3:57 PM

## 2022-11-15 ENCOUNTER — Ambulatory Visit: Payer: Medicare Other | Admitting: Physical Therapy

## 2022-11-15 ENCOUNTER — Encounter: Payer: Self-pay | Admitting: Physical Therapy

## 2022-11-15 DIAGNOSIS — M25562 Pain in left knee: Secondary | ICD-10-CM | POA: Diagnosis not present

## 2022-11-15 DIAGNOSIS — M6281 Muscle weakness (generalized): Secondary | ICD-10-CM

## 2022-11-15 DIAGNOSIS — R262 Difficulty in walking, not elsewhere classified: Secondary | ICD-10-CM

## 2022-11-15 DIAGNOSIS — R600 Localized edema: Secondary | ICD-10-CM

## 2022-11-15 DIAGNOSIS — M25662 Stiffness of left knee, not elsewhere classified: Secondary | ICD-10-CM

## 2022-11-15 NOTE — Therapy (Signed)
OUTPATIENT PHYSICAL THERAPY LOWER EXTREMITY TREATMENT   Patient Name: [name] MRN: [id number] DOB:03-14-1933, 89 y.o., female Today's Date: 11/15/2022  END OF SESSION:  PT End of Session - 11/15/22 1554     Visit Number 6    Date for PT Re-Evaluation 01/27/23    PT Start Time 1555    PT Stop Time 1649    PT Time Calculation (min) 54 min    Behavior During Therapy [facility] for tasks assessed/performed             Past Medical History:  Diagnosis Date   Acalculous cholecystitis 08/24/2018   Arthritis    DVT of lower extremity (deep venous thrombosis) (HCC)    right leg - after back surgery in 2019   GERD (gastroesophageal reflux disease)    History of blood transfusion    Hypothyroidism    PONV (postoperative nausea and vomiting)    Ptosis of both eyelids    Vertigo    Wears glasses    reading   Past Surgical History:  Procedure Laterality Date   ABDOMINAL HYSTERECTOMY  1986   APPLICATION OF ROBOTIC ASSISTANCE FOR SPINAL PROCEDURE N/A 07/17/2017   Procedure: APPLICATION OF ROBOTIC ASSISTANCE FOR SPINAL PROCEDURE;  Surgeon: Barnett Abu, MD;  [facility]: [facility];  Service: Neurosurgery;  Laterality: N/A;   APPLICATION OF ROBOTIC ASSISTANCE FOR SPINAL PROCEDURE N/A 09/01/2017   Procedure: APPLICATION OF ROBOTIC ASSISTANCE FOR SPINAL PROCEDURE;  Surgeon: Barnett Abu, MD;  [facility]: [facility];  Service: Neurosurgery;  Laterality: N/A;   APPLICATION OF ROBOTIC ASSISTANCE FOR SPINAL PROCEDURE N/A 10/27/2020   Procedure: APPLICATION OF ROBOTIC ASSISTANCE FOR SPINAL PROCEDURE;  Surgeon: Barnett Abu, MD;  [facility];  Service: Neurosurgery;  Laterality: N/A;   BACK SURGERY  1986   lumb lam   BREAST BIOPSY Right 2011   CARPAL TUNNEL RELEASE  2000   right   CARPAL TUNNEL RELEASE  2012   left   CHOLECYSTECTOMY N/A 08/24/2018   Procedure: LAPAROSCOPIC CHOLECYSTECTOMY WITH INTRAOPERATIVE CHOLANGIOGRAM;  Surgeon: Claud Kelp, MD;  Location: [facility] OR;  Service: General;   Laterality: N/A;   COLONOSCOPY     CYSTOCELE REPAIR  2007   sling   DISTAL INTERPHALANGEAL JOINT FUSION Right 02/04/2014   Procedure: FUSION DISTAL INTERPHALANGEAL JOINT RIGHT INDEX FINGER ;  Surgeon: Cindee Salt, MD;  Location: [facility];  Service: Orthopedics;  Laterality: Right;   EYE SURGERY Bilateral    Cataract surgery with lens implant   KNEE SURGERY  2009,2011   partial knee   LAPAROSCOPY N/A 08/24/2018   Procedure: LAPAROSCOPY DIAGNOSTIC;  Surgeon: Claud Kelp, MD;  Location: [facility] OR;  Service: General;  Laterality: N/A;   LUMBAR PERCUTANEOUS PEDICLE SCREW 4 LEVEL N/A 10/27/2020   Procedure: Thoracic nine-ten Laminectomy with extension of fusion from Thoracic ten to Thoracic six with segmental fixation (cemented) with pedicle screw and mazor;  Surgeon: Barnett Abu, MD;  Location: [facility] OR;  Service: Neurosurgery;  Laterality: N/A;   OPEN REDUCTION INTERNAL FIXATION (ORIF) PROXIMAL PHALANX Left 08/30/2013   Procedure: OPEN REDUCTION INTERNAL FIXATION (ORIF) PROXIMAL PHALANX FRACTURE LEFT SMALL FINGER; SPLINT RING FINGER;  Surgeon: Nicki Reaper, MD;  Location: [facility];  Service: Orthopedics;  Laterality: Left;   POSTERIOR LUMBAR FUSION 4 LEVEL N/A 07/17/2017   Procedure: Thoracic Ten - Lumbar Five revison of hardware with Mazor;  Surgeon: Barnett Abu, MD;  Location: [facility];  Service: Neurosurgery;  Laterality: N/A;  Thoracic/Lumbar   PTOSIS REPAIR  Bilateral 07/27/2015   Procedure: PTOSIS REPAIR;  Surgeon: Louisa Second, MD;  Location: [facility];  Service: Plastics;  Laterality: Bilateral;   SHOULDER SURGERY     rt rcr,and lt   TENOLYSIS Left 05/21/2021   Procedure: RELEASE EXTENSOR POLLICIS LONGUS LEFT;  Surgeon: Cindee Salt, MD;  Location: [facility];  Service: Orthopedics;  Laterality: Left;   TENOLYSIS Left 06/08/2021   Procedure: RELEASE EXTENSOR POLLICIS LONGUS LEFT;  Surgeon: Cindee Salt, MD;  Location: [facility];  Service: Orthopedics;  Laterality: Left;   TONSILLECTOMY     TOTAL KNEE ARTHROPLASTY Right 12/23/2019   Procedure: TOTAL KNEE ARTHROPLASTY;  Surgeon: Dannielle Huh, MD;  Location: [facility];  Service: Orthopedics;  Laterality: Right;   TRIGGER FINGER RELEASE Left 05/21/2021   Procedure: RELEASE TRIGGER FINGER/A-1 PULLEY LEFT MIDDLE FINGER;  Surgeon: Cindee Salt, MD;  Location: [facility];  Service: Orthopedics;  Laterality: Left;   TRIGGER FINGER RELEASE Left 06/08/2021   Procedure: RELEASE A-1 PULLEY LEFT MIDDLE FINGER;  Surgeon: Cindee Salt, MD;  Location: [facility];  Service: Orthopedics;  Laterality: Left;   Patient Active Problem List   Diagnosis Date Noted   Myelopathy concurrent with and due to spinal stenosis of thoracic region (HCC) 10/27/2020   S/P total knee arthroplasty, right 12/23/2019   Acalculous cholecystitis 08/24/2018   Osteoarthritis of right knee    Vertigo    DDD (degenerative disc disease), cervical    Benign essential HTN    History of DVT (deep vein thrombosis)    Tachycardia    Steroid-induced hyperglycemia    Hypothyroidism    Neuropathic pain    Acute blood loss anemia    S/P lumbar fusion    Closed L5 vertebral fracture (HCC) 08/31/2017   Closed fracture of fifth lumbar vertebra (HCC) 08/30/2017   Lumbar vertebral fracture (HCC) 07/17/2017   Scoliosis 06/13/2017    PCP: Burton Apley, MD  REFERRING PROVIDER: Sherlean Foot, MD  REFERRING DIAG: S/P left TKA  THERAPY DIAG:  Acute pain of left knee  Localized edema  Difficulty in walking, not elsewhere classified  Stiffness of left knee, not elsewhere classified  Muscle weakness (generalized)  Rationale for Evaluation and Treatment: Rehabilitation  ONSET DATE: 10/17/22  SUBJECTIVE:   SUBJECTIVE STATEMENT: "Im tired" Knee is tender and swollen  PERTINENT HISTORY: See above PAIN:  Are you having pain? Yes: NPRS scale: 6/10 Pain location: left  knee Pain description: ache, deep Aggravating factors: night, bending, being up on the leg pain a 10/10 Relieving factors: sit down, elevate, pain medication, ice pain can be 0/10  PRECAUTIONS: None  RED FLAGS: None   WEIGHT BEARING RESTRICTIONS: No  FALLS:  Has patient fallen in last 6 months? No  LIVING ENVIRONMENT: Lives with: lives with their family and lives with their spouse Lives in: House/apartment Stairs: Yes: Internal: 12 steps; can reach both Has following equipment at home: Environmental consultant - 4 wheeled, Shower bench, and bed side commode  OCCUPATION: retired  PLOF: Independent and was using a SPC, she was doing some cooking and cleaning  PATIENT GOALS: have good ROM, less pain, walk better, go up and down stairs  NEXT MD VISIT: 11/30/22  OBJECTIVE:   DIAGNOSTIC FINDINGS: s/p left TKA  PATIENT SURVEYS:  FOTO 6.5  COGNITION: Overall cognitive status: Within functional limits for tasks assessed     SENSATION: WFL Numbness laterally  EDEMA:  Circumferential: right mid patella 46 cm, left 55 cm  PALPATION:  Very swollen in the knee, the shin and the foot, very tender to palpation in the thigh, knee and shin, very sensitive where the tourniquet was  LOWER EXTREMITY ROM:  Active ROM Left AROM eval Left PROM eval  Hip flexion    Hip extension    Hip abduction    Hip adduction    Hip internal rotation    Hip external rotation    Knee flexion 80 85  Knee extension 27 20  Ankle dorsiflexion    Ankle plantarflexion    Ankle inversion    Ankle eversion     (Blank rows = not tested)  LOWER EXTREMITY MMT:  MMT Right eval Left eval  Hip flexion    Hip extension    Hip abduction    Hip adduction    Hip internal rotation    Hip external rotation    Knee flexion  3-  Knee extension  2  Ankle dorsiflexion    Ankle plantarflexion    Ankle inversion    Ankle eversion     (Blank rows = not tested) FUNCTIONAL TESTS:  Timed up and go (TUG): 51  seconds  GAIT: Distance walked: 50 feet Assistive device utilized: Walker - 2 wheeled Level of assistance: SBA Comments: slow and guarded, small steps   TODAY'S TREATMENT:                                                                                                                              DATE:   11/15/22 Nustep level 4 x 6 minutes, Then Level 3 LE only x3 min PROM of the left knee, gentle STM and scar massage and  STM L quad LAQ LLE 2x10 Hamstring curls green 2x10 Steps 4in alt pattern 2 rails 15 steps  Fitter press 2 blue 2x15 LLE  Vaso High pressure in elevation 36 degrees F  11/10/22 Nustep level 4 x 6 minutes HS curls green LLE 2x12 LAQ 2x10 LLE  PROM of the left knee, gentle STM and scar massage left knee S2S HHA x2 2x10 SAQ 2lb 2x10 Vaso High pressure in elevation 36 degrees F  11/08/22 Nustep level 4 x 6 minutes SAQ 2# Feet on ball K2C, trunk rotation, small bridge Stairs one at a time up and down 4" and 6" Red tband HS curls PROM of the left knee, gentle STM and scar massage left knee Ball b/n knees squeeze Red tband clamshells Vaso High pressure in elevation 36 degrees F  11/03/22 NuStep L3 x6 min L knee PROM flex and Ext HS curls red LLE 2x12 Leg Ext x10, x5 some assist with 2nd set Fitter press 1 blue LLE 2x15 S2S slighty elevated mat HHAx2 2x510 LLE SAQ 2x10  11/01/22 NuStep L3 x 6 min L knee PROM flex and Ext LAQ 2x10 assist needed at times   HS curls red LLE 2x10 Standing march In RW  S2S slighty elevated mat HHAx2 2x5   PATIENT EDUCATION:  Education details: HEP/POC Person educated: Patient Education method: Programmer, multimedia, Facilities manager, Verbal cues, and Handouts Education comprehension: verbalized understanding  HOME EXERCISE PROGRAM: Access Code: [email or web address] URL: [email or web address] Date: 10/27/2022 Prepared by: Stacie Glaze  Exercises - Seated March  - 2 x daily - 7 x weekly - 1 sets - 10 reps - 3 hold - Seated  Long Arc Quad  - 2 x daily - 7 x weekly - 1 sets - 10 reps - 3 hold - Seated Heel Raise  - 2 x daily - 7 x weekly - 1 sets - 10 reps - 3 hold - Seated Knee Flexion Stretch  - 1 x daily - 7 x weekly - 1 sets - 10 reps - 15 hold  ASSESSMENT:  CLINICAL IMPRESSION: Patient is a 89 y.o. female who was seen today for physical therapy treatment for s/p left TKA on 10/17/22.  She continues to have significant swelling and reports pain in the legs.  She has a great deal of difficulty getting the first rep of any exercises due to swelling and difficulty activating the mms, once she gets moving she does well. Pt reports a painful catch with Long and sort arc quads near tiba tuberosity. No pain when progressing to stairs with alternating pattern. Continues with ice and elevation to aid with swelling.  OBJECTIVE IMPAIRMENTS: Abnormal gait, cardiopulmonary status limiting activity, decreased activity tolerance, decreased balance, decreased endurance, decreased mobility, difficulty walking, decreased ROM, decreased strength, increased edema, impaired flexibility, improper body mechanics, and pain.   REHAB POTENTIAL: Good  CLINICAL DECISION MAKING: Stable/uncomplicated  EVALUATION COMPLEXITY: Low   GOALS: Goals reviewed with patient? Yes  SHORT TERM GOALS: Target date: 11/11/22 Independent with initial HEP Goal status: met 11/08/22  LONG TERM GOALS: Target date: 01/27/23  Independent with advanced HEP Goal status: INITIAL  2.  Decrease pain 50% Goal status:ongoing 11/08/22  3.  Increase AROM to 10-110 degrees flexion Goal status: INITIAL  4.  Go up and down stairs safely step over step with hand rails Goal status: ongoing 11/08/22  5.  Decrease swelling by 4cm Goal status: INITIAL  6.  Decrease TUG to 24 seconds Goal status: INITIAL   PLAN:  PT FREQUENCY: 1-2x/week  PT DURATION: 12 weeks  PLANNED INTERVENTIONS: Therapeutic exercises, Therapeutic activity, Neuromuscular re-education,  Balance training, Gait training, Patient/Family education, Self Care, Joint mobilization, Joint manipulation, Stair training, Electrical stimulation, Vasopneumatic device, and Manual therapy  PLAN FOR NEXT SESSION:  strength, ROM, function and gait   Grayce Sessions, PTA 11/15/2022, 4:39 PM

## 2022-11-17 ENCOUNTER — Ambulatory Visit: Payer: Medicare Other | Admitting: Physical Therapy

## 2022-11-17 DIAGNOSIS — M25562 Pain in left knee: Secondary | ICD-10-CM | POA: Diagnosis not present

## 2022-11-17 DIAGNOSIS — R262 Difficulty in walking, not elsewhere classified: Secondary | ICD-10-CM

## 2022-11-17 DIAGNOSIS — R600 Localized edema: Secondary | ICD-10-CM

## 2022-11-17 DIAGNOSIS — M6281 Muscle weakness (generalized): Secondary | ICD-10-CM

## 2022-11-17 DIAGNOSIS — M25662 Stiffness of left knee, not elsewhere classified: Secondary | ICD-10-CM

## 2022-11-17 NOTE — Therapy (Signed)
OUTPATIENT PHYSICAL THERAPY LOWER EXTREMITY TREATMENT   Patient Name: [name] MRN: [id number] DOB:01/19/1933, 89 y.o., female Today's Date: 11/17/2022  END OF SESSION:    Past Medical History:  Diagnosis Date   Acalculous cholecystitis 08/24/2018   Arthritis    DVT of lower extremity (deep venous thrombosis) (HCC)    right leg - after back surgery in 2019   GERD (gastroesophageal reflux disease)    History of blood transfusion    Hypothyroidism    PONV (postoperative nausea and vomiting)    Ptosis of both eyelids    Vertigo    Wears glasses    reading   Past Surgical History:  Procedure Laterality Date   ABDOMINAL HYSTERECTOMY  1986   APPLICATION OF ROBOTIC ASSISTANCE FOR SPINAL PROCEDURE N/A 07/17/2017   Procedure: APPLICATION OF ROBOTIC ASSISTANCE FOR SPINAL PROCEDURE;  Surgeon: Barnett Abu, MD;  Location: [facility] OR;  Service: Neurosurgery;  Laterality: N/A;   APPLICATION OF ROBOTIC ASSISTANCE FOR SPINAL PROCEDURE N/A 09/01/2017   Procedure: APPLICATION OF ROBOTIC ASSISTANCE FOR SPINAL PROCEDURE;  Surgeon: Barnett Abu, MD;  Location: [facility] OR;  Service: Neurosurgery;  Laterality: N/A;   APPLICATION OF ROBOTIC ASSISTANCE FOR SPINAL PROCEDURE N/A 10/27/2020   Procedure: APPLICATION OF ROBOTIC ASSISTANCE FOR SPINAL PROCEDURE;  Surgeon: Barnett Abu, MD;  Location: [facility] OR;  Service: Neurosurgery;  Laterality: N/A;   BACK SURGERY  1986   lumb lam   BREAST BIOPSY Right 2011   CARPAL TUNNEL RELEASE  2000   right   CARPAL TUNNEL RELEASE  2012   left   CHOLECYSTECTOMY N/A 08/24/2018   Procedure: LAPAROSCOPIC CHOLECYSTECTOMY WITH INTRAOPERATIVE CHOLANGIOGRAM;  Surgeon: Claud Kelp, MD;  Location: [facility] OR;  Service: General;  Laterality: N/A;   COLONOSCOPY     CYSTOCELE REPAIR  2007   sling   DISTAL INTERPHALANGEAL JOINT FUSION Right 02/04/2014   Procedure: FUSION DISTAL INTERPHALANGEAL JOINT RIGHT INDEX FINGER ;  Surgeon: Cindee Salt, MD;  Location: [facility];  Service: Orthopedics;  Laterality: Right;   EYE SURGERY Bilateral    Cataract surgery with lens implant   KNEE SURGERY  2009,2011   partial knee   LAPAROSCOPY N/A 08/24/2018   Procedure: LAPAROSCOPY DIAGNOSTIC;  Surgeon: Claud Kelp, MD;  Location: [facility] OR;  Service: General;  Laterality: N/A;   LUMBAR PERCUTANEOUS PEDICLE SCREW 4 LEVEL N/A 10/27/2020   Procedure: Thoracic nine-ten Laminectomy with extension of fusion from Thoracic ten to Thoracic six with segmental fixation (cemented) with pedicle screw and mazor;  Surgeon: Barnett Abu, MD;  Location: [facility] OR;  Service: Neurosurgery;  Laterality: N/A;   OPEN REDUCTION INTERNAL FIXATION (ORIF) PROXIMAL PHALANX Left 08/30/2013   Procedure: OPEN REDUCTION INTERNAL FIXATION (ORIF) PROXIMAL PHALANX FRACTURE LEFT SMALL FINGER; SPLINT RING FINGER;  Surgeon: Nicki Reaper, MD;  Location: [facility];  Service: Orthopedics;  Laterality: Left;   POSTERIOR LUMBAR FUSION 4 LEVEL N/A 07/17/2017   Procedure: Thoracic Ten - Lumbar Five revison of hardware with Mazor;  Surgeon: Barnett Abu, MD;  Location: [facility] OR;  Service: Neurosurgery;  Laterality: N/A;  Thoracic/Lumbar   PTOSIS REPAIR Bilateral 07/27/2015   Procedure: PTOSIS REPAIR;  Surgeon: Louisa Second, MD;  Location: [facility];  Service: Plastics;  Laterality: Bilateral;   SHOULDER SURGERY     rt rcr,and lt   TENOLYSIS Left 05/21/2021   Procedure: RELEASE EXTENSOR POLLICIS LONGUS LEFT;  Surgeon: Cindee Salt, MD;  Location: [facility];  Service: Orthopedics;  Laterality: Left;  TENOLYSIS Left 06/08/2021   Procedure: RELEASE EXTENSOR POLLICIS LONGUS LEFT;  Surgeon: Cindee Salt, MD;  Location: [facility];  Service: Orthopedics;  Laterality: Left;   TONSILLECTOMY     TOTAL KNEE ARTHROPLASTY Right 12/23/2019   Procedure: TOTAL KNEE ARTHROPLASTY;  Surgeon: Dannielle Huh, MD;  Location: [facility];  Service: Orthopedics;  Laterality:  Right;   TRIGGER FINGER RELEASE Left 05/21/2021   Procedure: RELEASE TRIGGER FINGER/A-1 PULLEY LEFT MIDDLE FINGER;  Surgeon: Cindee Salt, MD;  Location: [facility];  Service: Orthopedics;  Laterality: Left;   TRIGGER FINGER RELEASE Left 06/08/2021   Procedure: RELEASE A-1 PULLEY LEFT MIDDLE FINGER;  Surgeon: Cindee Salt, MD;  Location:  SURGERY CENTER;  Service: Orthopedics;  Laterality: Left;   Patient Active Problem List   Diagnosis Date Noted   Myelopathy concurrent with and due to spinal stenosis of thoracic region (HCC) 10/27/2020   S/P total knee arthroplasty, right 12/23/2019   Acalculous cholecystitis 08/24/2018   Osteoarthritis of right knee    Vertigo    DDD (degenerative disc disease), cervical    Benign essential HTN    History of DVT (deep vein thrombosis)    Tachycardia    Steroid-induced hyperglycemia    Hypothyroidism    Neuropathic pain    Acute blood loss anemia    S/P lumbar fusion    Closed L5 vertebral fracture (HCC) 08/31/2017   Closed fracture of fifth lumbar vertebra (HCC) 08/30/2017   Lumbar vertebral fracture (HCC) 07/17/2017   Scoliosis 06/13/2017    PCP: Burton Apley, MD  REFERRING PROVIDER: Sherlean Foot, MD  REFERRING DIAG: S/P left TKA  THERAPY DIAG:  No diagnosis found.  Rationale for Evaluation and Treatment: Rehabilitation  ONSET DATE: 10/17/22  SUBJECTIVE:   SUBJECTIVE STATEMENT: "Been on my feet all day" sore from last session.  PERTINENT HISTORY: See above PAIN:  Are you having pain? Yes: NPRS scale: 3/10 Pain location: left knee Pain description: ache, deep Aggravating factors: night, bending, being up on the leg pain a 10/10 Relieving factors: sit down, elevate, pain medication, ice pain can be 0/10  PRECAUTIONS: None  RED FLAGS: None   WEIGHT BEARING RESTRICTIONS: No  FALLS:  Has patient fallen in last 6 months? No  LIVING ENVIRONMENT: Lives with: lives with their family and lives with their  spouse Lives in: House/apartment Stairs: Yes: Internal: 12 steps; can reach both Has following equipment at home: Environmental consultant - 4 wheeled, Shower bench, and bed side commode  OCCUPATION: retired  PLOF: Independent and was using a SPC, she was doing some cooking and cleaning  PATIENT GOALS: have good ROM, less pain, walk better, go up and down stairs  NEXT MD VISIT: 11/30/22  OBJECTIVE:   DIAGNOSTIC FINDINGS: s/p left TKA  PATIENT SURVEYS:  FOTO 6.5  COGNITION: Overall cognitive status: Within functional limits for tasks assessed     SENSATION: WFL Numbness laterally  EDEMA:  Circumferential: right mid patella 46 cm, left 55 cm  PALPATION: Very swollen in the knee, the shin and the foot, very tender to palpation in the thigh, knee and shin, very sensitive where the tourniquet was  LOWER EXTREMITY ROM:  Active ROM Left AROM eval Left PROM eval Left AROM  Sitting 11/17/22  Hip flexion     Hip extension     Hip abduction     Hip adduction     Hip internal rotation     Hip external rotation     Knee flexion 80 85 120  Knee extension 27 20 5   Ankle dorsiflexion     Ankle plantarflexion     Ankle inversion     Ankle eversion      (Blank rows = not tested)  LOWER EXTREMITY MMT:  MMT Right eval Left eval  Hip flexion    Hip extension    Hip abduction    Hip adduction    Hip internal rotation    Hip external rotation    Knee flexion  3-  Knee extension  2  Ankle dorsiflexion    Ankle plantarflexion    Ankle inversion    Ankle eversion     (Blank rows = not tested) FUNCTIONAL TESTS:  Timed up and go (TUG): 51 seconds  GAIT: Distance walked: 50 feet Assistive device utilized: Walker - 2 wheeled Level of assistance: SBA Comments: slow and guarded, small steps   TODAY'S TREATMENT:                                                                                                                              DATE:    11/17/22 AROM sitting 5-120 STW to left  knee and quad for swelling and pain relief TKE with manual resistance 2 sets 10 LAQ with PTA keeping thigh down to not use hip flexor 2 sets 10. Hold 3 sec TKE Nustep L 3 LE only 4 inch LLE step up 10 x fwd and latrally- CG A with walker HS curl  20 # 10 x BIL. 15# LLE only Knee ext 2 sets 5 up with 2 eccentric LLE lowers Amb with SPC CG-minA 100 feet,BIL hip weakness and positive trendelumberg VASO after for swelling with again educ on need ot elevate throughout the day for swelling   11/15/22 Nustep level 4 x 6 minutes, Then Level 3 LE only x3 min PROM of the left knee, gentle STM and scar massage and  STM L quad LAQ LLE 2x10 Hamstring curls green 2x10 Steps 4in alt pattern 2 rails 15 steps  Fitter press 2 blue 2x15 LLE  Vaso High pressure in elevation 36 degrees F  11/10/22 Nustep level 4 x 6 minutes HS curls green LLE 2x12 LAQ 2x10 LLE  PROM of the left knee, gentle STM and scar massage left knee S2S HHA x2 2x10 SAQ 2lb 2x10 Vaso High pressure in elevation 36 degrees F  11/08/22 Nustep level 4 x 6 minutes SAQ 2# Feet on ball K2C, trunk rotation, small bridge Stairs one at a time up and down 4" and 6" Red tband HS curls PROM of the left knee, gentle STM and scar massage left knee Ball b/n knees squeeze Red tband clamshells Vaso High pressure in elevation 36 degrees F  11/03/22 NuStep L3 x6 min L knee PROM flex and Ext HS curls red LLE 2x12 Leg Ext x10, x5 some assist with 2nd set Fitter press 1 blue LLE 2x15 S2S slighty elevated mat HHAx2 2x510 LLE SAQ 2x10  11/01/22  NuStep L3 x 6 min L knee PROM flex and Ext LAQ 2x10 assist needed at times   HS curls red LLE 2x10 Standing march In RW  S2S slighty elevated mat HHAx2 2x5   PATIENT EDUCATION:  Education details: HEP/POC Person educated: Patient Education method: Programmer, multimedia, Facilities manager, Verbal cues, and Handouts Education comprehension: verbalized understanding  HOME EXERCISE PROGRAM: Access Code:  [email or web address] URL: [email or web address] Date: 10/27/2022 Prepared by: Stacie Glaze  Exercises - Seated March  - 2 x daily - 7 x weekly - 1 sets - 10 reps - 3 hold - Seated Long Arc Quad  - 2 x daily - 7 x weekly - 1 sets - 10 reps - 3 hold - Seated Heel Raise  - 2 x daily - 7 x weekly - 1 sets - 10 reps - 3 hold - Seated Knee Flexion Stretch  - 1 x daily - 7 x weekly - 1 sets - 10 reps - 15 hold  ASSESSMENT:  CLINICAL IMPRESSION: Pt arrived stating she was sore but been up and down all day. Stressed need to elevate above heart throughout day as she is very swollen and this is increasing pain. Worked on quad strength today with cuing. Tried SPC but CG-min A needed with poor gait ,postitive trendelenburg and hip weakness noted. ROM LTG met OBJECTIVE IMPAIRMENTS: Abnormal gait, cardiopulmonary status limiting activity, decreased activity tolerance, decreased balance, decreased endurance, decreased mobility, difficulty walking, decreased ROM, decreased strength, increased edema, impaired flexibility, improper body mechanics, and pain.   REHAB POTENTIAL: Good  CLINICAL DECISION MAKING: Stable/uncomplicated  EVALUATION COMPLEXITY: Low   GOALS: Goals reviewed with patient? Yes  SHORT TERM GOALS: Target date: 11/11/22 Independent with initial HEP Goal status: met 11/08/22  LONG TERM GOALS: Target date: 01/27/23  Independent with advanced HEP Goal status: INITIAL  2.  Decrease pain 50% Goal status:ongoing 11/08/22  3.  Increase AROM to 10-110 degrees flexion Goal status: 11/17/22 MET 5-120  4.  Go up and down stairs safely step over step with hand rails Goal status: ongoing 11/08/22  5.  Decrease swelling by 4cm Goal status: INITIAL  6.  Decrease TUG to 24 seconds Goal status: INITIAL   PLAN:  PT FREQUENCY: 1-2x/week  PT DURATION: 12 weeks  PLANNED INTERVENTIONS: Therapeutic exercises, Therapeutic activity, Neuromuscular re-education, Balance training, Gait  training, Patient/Family education, Self Care, Joint mobilization, Joint manipulation, Stair training, Electrical stimulation, Vasopneumatic device, and Manual therapy  PLAN FOR NEXT SESSION:  strength QUAD and hips, ROM for TKE. function and gait   ,ANGIE, PTA 11/17/2022, 4:22 PM [facility] [address] [facility] [place], [place], [address] Phone: [phone]   Fax:  [phone]  Patient Details  Name: [name] MRN: [id number] Date of Birth: 08/12/1932 Referring Provider:  Dannielle Huh, MD

## 2022-11-22 ENCOUNTER — Ambulatory Visit: Payer: Medicare Other | Admitting: Physical Therapy

## 2022-11-24 ENCOUNTER — Ambulatory Visit: Payer: Medicare Other | Admitting: Physical Therapy

## 2022-11-24 ENCOUNTER — Encounter: Payer: Self-pay | Admitting: Physical Therapy

## 2022-11-24 DIAGNOSIS — M25562 Pain in left knee: Secondary | ICD-10-CM

## 2022-11-24 DIAGNOSIS — M25662 Stiffness of left knee, not elsewhere classified: Secondary | ICD-10-CM

## 2022-11-24 DIAGNOSIS — M6281 Muscle weakness (generalized): Secondary | ICD-10-CM

## 2022-11-24 DIAGNOSIS — R600 Localized edema: Secondary | ICD-10-CM

## 2022-11-24 DIAGNOSIS — R262 Difficulty in walking, not elsewhere classified: Secondary | ICD-10-CM

## 2022-11-24 NOTE — Therapy (Signed)
OUTPATIENT PHYSICAL THERAPY LOWER EXTREMITY TREATMENT   Patient Name: [name] MRN: [id number] DOB:February 09, 1933, 89 y.o., female Today's Date: 11/24/2022  END OF SESSION:  PT End of Session - 11/24/22 1618     Visit Number 8    Date for PT Re-Evaluation 01/27/23    Authorization Type [employer] Medicare    PT Start Time 1610    PT Stop Time 1700    PT Time Calculation (min) 50 min    Activity Tolerance Patient tolerated treatment well    Behavior During Therapy [facility] for tasks assessed/performed              Past Medical History:  Diagnosis Date   Acalculous cholecystitis 08/24/2018   Arthritis    DVT of lower extremity (deep venous thrombosis) (HCC)    right leg - after back surgery in 2019   GERD (gastroesophageal reflux disease)    History of blood transfusion    Hypothyroidism    PONV (postoperative nausea and vomiting)    Ptosis of both eyelids    Vertigo    Wears glasses    reading   Past Surgical History:  Procedure Laterality Date   ABDOMINAL HYSTERECTOMY  1986   APPLICATION OF ROBOTIC ASSISTANCE FOR SPINAL PROCEDURE N/A 07/17/2017   Procedure: APPLICATION OF ROBOTIC ASSISTANCE FOR SPINAL PROCEDURE;  Surgeon: Barnett Abu, MD;  Location: [facility] OR;  Service: Neurosurgery;  Laterality: N/A;   APPLICATION OF ROBOTIC ASSISTANCE FOR SPINAL PROCEDURE N/A 09/01/2017   Procedure: APPLICATION OF ROBOTIC ASSISTANCE FOR SPINAL PROCEDURE;  Surgeon: Barnett Abu, MD;  Location: [facility] OR;  Service: Neurosurgery;  Laterality: N/A;   APPLICATION OF ROBOTIC ASSISTANCE FOR SPINAL PROCEDURE N/A 10/27/2020   Procedure: APPLICATION OF ROBOTIC ASSISTANCE FOR SPINAL PROCEDURE;  Surgeon: Barnett Abu, MD;  Location: [facility] OR;  Service: Neurosurgery;  Laterality: N/A;   BACK SURGERY  1986   lumb lam   BREAST BIOPSY Right 2011   CARPAL TUNNEL RELEASE  2000   right   CARPAL TUNNEL RELEASE  2012   left   CHOLECYSTECTOMY N/A 08/24/2018   Procedure: LAPAROSCOPIC CHOLECYSTECTOMY WITH  INTRAOPERATIVE CHOLANGIOGRAM;  Surgeon: Claud Kelp, MD;  [facility] OR;  Service: General;  Laterality: N/A;   COLONOSCOPY     CYSTOCELE REPAIR  2007   sling   DISTAL INTERPHALANGEAL JOINT FUSION Right 02/04/2014   Procedure: FUSION DISTAL INTERPHALANGEAL JOINT RIGHT INDEX FINGER ;  Surgeon: Cindee Salt, MD;  Location: [facility];  Service: Orthopedics;  Laterality: Right;   EYE SURGERY Bilateral    Cataract surgery with lens implant   KNEE SURGERY  2009,2011   partial knee   LAPAROSCOPY N/A 08/24/2018   Procedure: LAPAROSCOPY DIAGNOSTIC;  Surgeon: Claud Kelp, MD;  Location: [facility] OR;  Service: General;  Laterality: N/A;   LUMBAR PERCUTANEOUS PEDICLE SCREW 4 LEVEL N/A 10/27/2020   Procedure: Thoracic nine-ten Laminectomy with extension of fusion from Thoracic ten to Thoracic six with segmental fixation (cemented) with pedicle screw and mazor;  Surgeon: Barnett Abu, MD;  Location: [facility] OR;  Service: Neurosurgery;  Laterality: N/A;   OPEN REDUCTION INTERNAL FIXATION (ORIF) PROXIMAL PHALANX Left 08/30/2013   Procedure: OPEN REDUCTION INTERNAL FIXATION (ORIF) PROXIMAL PHALANX FRACTURE LEFT SMALL FINGER; SPLINT RING FINGER;  Surgeon: Nicki Reaper, MD;  Location: [facility];  Service: Orthopedics;  Laterality: Left;   POSTERIOR LUMBAR FUSION 4 LEVEL N/A 07/17/2017   Procedure: Thoracic Ten - Lumbar Five revison of hardware with Mazor;  Surgeon: Barnett Abu,  MD;  Location: [facility] OR;  Service: Neurosurgery;  Laterality: N/A;  Thoracic/Lumbar   PTOSIS REPAIR Bilateral 07/27/2015   Procedure: PTOSIS REPAIR;  Surgeon: Louisa Second, MD;  Location: [facility];  Service: Plastics;  Laterality: Bilateral;   SHOULDER SURGERY     rt rcr,and lt   TENOLYSIS Left 05/21/2021   Procedure: RELEASE EXTENSOR POLLICIS LONGUS LEFT;  Surgeon: Cindee Salt, MD;  Location: [facility];  Service: Orthopedics;  Laterality: Left;   TENOLYSIS Left 06/08/2021    Procedure: RELEASE EXTENSOR POLLICIS LONGUS LEFT;  Surgeon: Cindee Salt, MD;  Location: [facility];  Service: Orthopedics;  Laterality: Left;   TONSILLECTOMY     TOTAL KNEE ARTHROPLASTY Right 12/23/2019   Procedure: TOTAL KNEE ARTHROPLASTY;  Surgeon: Dannielle Huh, MD;  Location: [facility];  Service: Orthopedics;  Laterality: Right;   TRIGGER FINGER RELEASE Left 05/21/2021   Procedure: RELEASE TRIGGER FINGER/A-1 PULLEY LEFT MIDDLE FINGER;  Surgeon: Cindee Salt, MD;  Location: [facility];  Service: Orthopedics;  Laterality: Left;   TRIGGER FINGER RELEASE Left 06/08/2021   Procedure: RELEASE A-1 PULLEY LEFT MIDDLE FINGER;  Surgeon: Cindee Salt, MD;  Location: [facility];  Service: Orthopedics;  Laterality: Left;   Patient Active Problem List   Diagnosis Date Noted   Myelopathy concurrent with and due to spinal stenosis of thoracic region (HCC) 10/27/2020   S/P total knee arthroplasty, right 12/23/2019   Acalculous cholecystitis 08/24/2018   Osteoarthritis of right knee    Vertigo    DDD (degenerative disc disease), cervical    Benign essential HTN    History of DVT (deep vein thrombosis)    Tachycardia    Steroid-induced hyperglycemia    Hypothyroidism    Neuropathic pain    Acute blood loss anemia    S/P lumbar fusion    Closed L5 vertebral fracture (HCC) 08/31/2017   Closed fracture of fifth lumbar vertebra (HCC) 08/30/2017   Lumbar vertebral fracture (HCC) 07/17/2017   Scoliosis 06/13/2017    PCP: Burton Apley, MD  REFERRING PROVIDER: Sherlean Foot, MD  REFERRING DIAG: S/P left TKA  THERAPY DIAG:  Acute pain of left knee  Localized edema  Difficulty in walking, not elsewhere classified  Stiffness of left knee, not elsewhere classified  Muscle weakness (generalized)  Rationale for Evaluation and Treatment: Rehabilitation  ONSET DATE: 10/17/22  SUBJECTIVE:   SUBJECTIVE STATEMENT: Patient reports that she had a fall on  Monday  PERTINENT HISTORY: See above PAIN:  Are you having pain? Yes: NPRS scale: 3/10 Pain location: left knee Pain description: ache, deep Aggravating factors: night, bending, being up on the leg pain a 10/10 Relieving factors: sit down, elevate, pain medication, ice pain can be 0/10  PRECAUTIONS: None  RED FLAGS: None   WEIGHT BEARING RESTRICTIONS: No  FALLS:  Has patient fallen in last 6 months? No  LIVING ENVIRONMENT: Lives with: lives with their family and lives with their spouse Lives in: House/apartment Stairs: Yes: Internal: 12 steps; can reach both Has following equipment at home: Walker - 4 wheeled, Shower bench, and bed side commode  OCCUPATION: retired  PLOF: Independent and was using a SPC, she was doing some cooking and cleaning  PATIENT GOALS: have good ROM, less pain, walk better, go up and down stairs  NEXT MD VISIT: 11/30/22  OBJECTIVE:   DIAGNOSTIC FINDINGS: s/p left TKA  PATIENT SURVEYS:  FOTO 6.5  COGNITION: Overall cognitive status: Within functional limits for tasks assessed  SENSATION: WFL Numbness laterally  EDEMA:  Circumferential: right mid patella 46 cm, left 55 cm  PALPATION: Very swollen in the knee, the shin and the foot, very tender to palpation in the thigh, knee and shin, very sensitive where the tourniquet was  LOWER EXTREMITY ROM:  Active ROM Left AROM eval Left PROM eval Left AROM  Sitting 11/17/22  Hip flexion     Hip extension     Hip abduction     Hip adduction     Hip internal rotation     Hip external rotation     Knee flexion 80 85 120  Knee extension 27 20 5   Ankle dorsiflexion     Ankle plantarflexion     Ankle inversion     Ankle eversion      (Blank rows = not tested)  LOWER EXTREMITY MMT:  MMT Right eval Left eval  Hip flexion    Hip extension    Hip abduction    Hip adduction    Hip internal rotation    Hip external rotation    Knee flexion  3-  Knee extension  2  Ankle  dorsiflexion    Ankle plantarflexion    Ankle inversion    Ankle eversion     (Blank rows = not tested) FUNCTIONAL TESTS:  Timed up and go (TUG): 51 seconds  GAIT: Distance walked: 50 feet Assistive device utilized: Walker - 2 wheeled Level of assistance: SBA Comments: slow and guarded, small steps   TODAY'S TREATMENT:                                                                                                                              DATE:   11/24/22 Nustep level 4 x 7 minutes Gentle STM to bilateral knees where she fell PAROM to bilateral knees Red tband HS curls both 3# LAQ with cues for TKE Ball b/n knees squeeze Red tband clamshells Gait with SPC x 200 feet CGA Gait with SPC and CGA outs the back door, negotiated 3 curbs up and down Vaso 10 mins high pressure 36 degrees F in elevation  11/17/22 AROM sitting 5-120 STW to left knee and quad for swelling and pain relief TKE with manual resistance 2 sets 10 LAQ with PTA keeping thigh down to not use hip flexor 2 sets 10. Hold 3 sec TKE Nustep L 3 LE only 4 inch LLE step up 10 x fwd and latrally- CG A with walker HS curl  20 # 10 x BIL. 15# LLE only Knee ext 2 sets 5 up with 2 eccentric LLE lowers Amb with SPC CG-minA 100 feet,BIL hip weakness and positive trendelumberg VASO after for swelling with again educ on need ot elevate throughout the day for swelling   11/15/22 Nustep level 4 x 6 minutes, Then Level 3 LE only x3 min PROM of the left knee, gentle STM and scar massage and  STM L quad LAQ LLE  2x10 Hamstring curls green 2x10 Steps 4in alt pattern 2 rails 15 steps  Fitter press 2 blue 2x15 LLE  Vaso High pressure in elevation 36 degrees F  11/10/22 Nustep level 4 x 6 minutes HS curls green LLE 2x12 LAQ 2x10 LLE  PROM of the left knee, gentle STM and scar massage left knee S2S HHA x2 2x10 SAQ 2lb 2x10 Vaso High pressure in elevation 36 degrees F  11/08/22 Nustep level 4 x 6 minutes SAQ 2# Feet  on ball K2C, trunk rotation, small bridge Stairs one at a time up and down 4" and 6" Red tband HS curls PROM of the left knee, gentle STM and scar massage left knee Ball b/n knees squeeze Red tband clamshells Vaso High pressure in elevation 36 degrees F  11/03/22 NuStep L3 x6 min L knee PROM flex and Ext HS curls red LLE 2x12 Leg Ext x10, x5 some assist with 2nd set Fitter press 1 blue LLE 2x15 S2S slighty elevated mat HHAx2 2x510 LLE SAQ 2x10  11/01/22 NuStep L3 x 6 min L knee PROM flex and Ext LAQ 2x10 assist needed at times   HS curls red LLE 2x10 Standing march In RW  S2S slighty elevated mat HHAx2 2x5   PATIENT EDUCATION:  Education details: HEP/POC Person educated: Patient Education method: Programmer, multimedia, Facilities manager, Verbal cues, and Handouts Education comprehension: verbalized understanding  HOME EXERCISE PROGRAM: Access Code: [email or web address] URL: [email or web address] Date: 10/27/2022 Prepared by: Stacie Glaze  Exercises - Seated March  - 2 x daily - 7 x weekly - 1 sets - 10 reps - 3 hold - Seated Long Arc Quad  - 2 x daily - 7 x weekly - 1 sets - 10 reps - 3 hold - Seated Heel Raise  - 2 x daily - 7 x weekly - 1 sets - 10 reps - 3 hold - Seated Knee Flexion Stretch  - 1 x daily - 7 x weekly - 1 sets - 10 reps - 15 hold  ASSESSMENT:  CLINICAL IMPRESSION: Pt with a fall Monday night reports that she stepped out of the walker to the side and was bending over to pick up something and the walker turned over as she was pushing on one side of it, landed on both knees she went to the MD on Tuesday and reports everything is okay but she is very sore, we worked on curbs and walking with cane, did well inside but outside she is much more unsure of her self and truly needs CGA OBJECTIVE IMPAIRMENTS: Abnormal gait, cardiopulmonary status limiting activity, decreased activity tolerance, decreased balance, decreased endurance, decreased mobility, difficulty walking,  decreased ROM, decreased strength, increased edema, impaired flexibility, improper body mechanics, and pain.   REHAB POTENTIAL: Good  CLINICAL DECISION MAKING: Stable/uncomplicated  EVALUATION COMPLEXITY: Low   GOALS: Goals reviewed with patient? Yes  SHORT TERM GOALS: Target date: 11/11/22 Independent with initial HEP Goal status: met 11/08/22  LONG TERM GOALS: Target date: 01/27/23  Independent with advanced HEP Goal status: INITIAL  2.  Decrease pain 50% Goal status:ongoing 11/08/22  3.  Increase AROM to 10-110 degrees flexion Goal status: 11/17/22 MET 5-120  4.  Go up and down stairs safely step over step with hand rails Goal status: ongoing 11/24/22  5.  Decrease swelling by 4cm Goal status: INITIAL  6.  Decrease TUG to 24 seconds Goal status: ongoing 11/24/22   PLAN:  PT FREQUENCY: 1-2x/week  PT DURATION: 12 weeks  PLANNED INTERVENTIONS: Therapeutic exercises, Therapeutic activity, Neuromuscular  re-education, Balance training, Gait training, Patient/Family education, Self Care, Joint mobilization, Joint manipulation, Stair training, Electrical stimulation, Vasopneumatic device, and Manual therapy  PLAN FOR NEXT SESSION:  strength QUAD and hips, ROM for TKE. function and gait   Jearld Lesch, PT 11/24/2022, 4:20 PM [facility]. [place], [place], [address] Phone: [phone]   Fax:  [phone]  Patient Details  Name: [name] MRN: [id number] Date of Birth: 07-18-1932 Referring Provider:  Dannielle Huh, MD

## 2022-11-28 ENCOUNTER — Ambulatory Visit: Payer: Medicare Other | Admitting: Physical Therapy

## 2022-12-01 ENCOUNTER — Ambulatory Visit: Payer: Medicare Other | Admitting: Physical Therapy

## 2022-12-06 ENCOUNTER — Encounter: Payer: Self-pay | Admitting: Physical Therapy

## 2022-12-06 ENCOUNTER — Ambulatory Visit: Payer: Medicare Other | Attending: Orthopedic Surgery | Admitting: Physical Therapy

## 2022-12-06 DIAGNOSIS — M25562 Pain in left knee: Secondary | ICD-10-CM | POA: Diagnosis present

## 2022-12-06 DIAGNOSIS — M25662 Stiffness of left knee, not elsewhere classified: Secondary | ICD-10-CM | POA: Insufficient documentation

## 2022-12-06 DIAGNOSIS — M6281 Muscle weakness (generalized): Secondary | ICD-10-CM | POA: Diagnosis present

## 2022-12-06 DIAGNOSIS — R600 Localized edema: Secondary | ICD-10-CM | POA: Diagnosis present

## 2022-12-06 DIAGNOSIS — R262 Difficulty in walking, not elsewhere classified: Secondary | ICD-10-CM | POA: Insufficient documentation

## 2022-12-06 NOTE — Therapy (Signed)
OUTPATIENT PHYSICAL THERAPY LOWER EXTREMITY TREATMENT   Patient Name: [name] MRN: [id number] DOB:11-Dec-1932, 89 y.o., female Today's Date: 12/06/2022  END OF SESSION:  PT End of Session - 12/06/22 1555     Visit Number 9    Date for PT Re-Evaluation 01/27/23    PT Start Time 1600    PT Stop Time 1645    PT Time Calculation (min) 45 min    Activity Tolerance Patient tolerated treatment well    Behavior During Therapy [facility] for tasks assessed/performed              Past Medical History:  Diagnosis Date   Acalculous cholecystitis 08/24/2018   Arthritis    DVT of lower extremity (deep venous thrombosis) (HCC)    right leg - after back surgery in 2019   GERD (gastroesophageal reflux disease)    History of blood transfusion    Hypothyroidism    PONV (postoperative nausea and vomiting)    Ptosis of both eyelids    Vertigo    Wears glasses    reading   Past Surgical History:  Procedure Laterality Date   ABDOMINAL HYSTERECTOMY  1986   APPLICATION OF ROBOTIC ASSISTANCE FOR SPINAL PROCEDURE N/A 07/17/2017   Procedure: APPLICATION OF ROBOTIC ASSISTANCE FOR SPINAL PROCEDURE;  Surgeon: Barnett Abu, MD;  Location: [facility] OR;  Service: Neurosurgery;  Laterality: N/A;   APPLICATION OF ROBOTIC ASSISTANCE FOR SPINAL PROCEDURE N/A 09/01/2017   Procedure: APPLICATION OF ROBOTIC ASSISTANCE FOR SPINAL PROCEDURE;  Surgeon: Barnett Abu, MD;  Location: [facility] OR;  Service: Neurosurgery;  Laterality: N/A;   APPLICATION OF ROBOTIC ASSISTANCE FOR SPINAL PROCEDURE N/A 10/27/2020   Procedure: APPLICATION OF ROBOTIC ASSISTANCE FOR SPINAL PROCEDURE;  Surgeon: Barnett Abu, MD;  Location: [facility] OR;  Service: Neurosurgery;  Laterality: N/A;   BACK SURGERY  1986   lumb lam   BREAST BIOPSY Right 2011   CARPAL TUNNEL RELEASE  2000   right   CARPAL TUNNEL RELEASE  2012   left   CHOLECYSTECTOMY N/A 08/24/2018   Procedure: LAPAROSCOPIC CHOLECYSTECTOMY WITH INTRAOPERATIVE CHOLANGIOGRAM;  Surgeon:  Claud Kelp, MD;  Location: [facility] OR;  Service: General;  Laterality: N/A;   COLONOSCOPY     CYSTOCELE REPAIR  2007   sling   DISTAL INTERPHALANGEAL JOINT FUSION Right 02/04/2014   Procedure: FUSION DISTAL INTERPHALANGEAL JOINT RIGHT INDEX FINGER ;  Surgeon: Cindee Salt, MD;  Location: [facility];  Service: Orthopedics;  Laterality: Right;   EYE SURGERY Bilateral    Cataract surgery with lens implant   KNEE SURGERY  2009,2011   partial knee   LAPAROSCOPY N/A 08/24/2018   Procedure: LAPAROSCOPY DIAGNOSTIC;  Surgeon: Claud Kelp, MD;  Location: [facility] OR;  Service: General;  Laterality: N/A;   LUMBAR PERCUTANEOUS PEDICLE SCREW 4 LEVEL N/A 10/27/2020   Procedure: Thoracic nine-ten Laminectomy with extension of fusion from Thoracic ten to Thoracic six with segmental fixation (cemented) with pedicle screw and mazor;  Surgeon: Barnett Abu, MD;  Location: [facility] OR;  Service: Neurosurgery;  Laterality: N/A;   OPEN REDUCTION INTERNAL FIXATION (ORIF) PROXIMAL PHALANX Left 08/30/2013   Procedure: OPEN REDUCTION INTERNAL FIXATION (ORIF) PROXIMAL PHALANX FRACTURE LEFT SMALL FINGER; SPLINT RING FINGER;  Surgeon: Nicki Reaper, MD;  Location: [facility];  Service: Orthopedics;  Laterality: Left;   POSTERIOR LUMBAR FUSION 4 LEVEL N/A 07/17/2017   Procedure: Thoracic Ten - Lumbar Five revison of hardware with Mazor;  Surgeon: Barnett Abu, MD;  Location: [facility] OR;  Service:  Neurosurgery;  Laterality: N/A;  Thoracic/Lumbar   PTOSIS REPAIR Bilateral 07/27/2015   Procedure: PTOSIS REPAIR;  Surgeon: Louisa Second, MD;  Location: [facility];  Service: Plastics;  Laterality: Bilateral;   SHOULDER SURGERY     rt rcr,and lt   TENOLYSIS Left 05/21/2021   Procedure: RELEASE EXTENSOR POLLICIS LONGUS LEFT;  Surgeon: Cindee Salt, MD;  Location: [facility];  Service: Orthopedics;  Laterality: Left;   TENOLYSIS Left 06/08/2021   Procedure: RELEASE EXTENSOR POLLICIS  LONGUS LEFT;  Surgeon: Cindee Salt, MD;  Location: [facility];  Service: Orthopedics;  Laterality: Left;   TONSILLECTOMY     TOTAL KNEE ARTHROPLASTY Right 12/23/2019   Procedure: TOTAL KNEE ARTHROPLASTY;  Surgeon: Dannielle Huh, MD;  Location: [facility];  Service: Orthopedics;  Laterality: Right;   TRIGGER FINGER RELEASE Left 05/21/2021   Procedure: RELEASE TRIGGER FINGER/A-1 PULLEY LEFT MIDDLE FINGER;  Surgeon: Cindee Salt, MD;  Location: [facility];  Service: Orthopedics;  Laterality: Left;   TRIGGER FINGER RELEASE Left 06/08/2021   Procedure: RELEASE A-1 PULLEY LEFT MIDDLE FINGER;  Surgeon: Cindee Salt, MD;  Location:  SURGERY CENTER;  Service: Orthopedics;  Laterality: Left;   Patient Active Problem List   Diagnosis Date Noted   Myelopathy concurrent with and due to spinal stenosis of thoracic region (HCC) 10/27/2020   S/P total knee arthroplasty, right 12/23/2019   Acalculous cholecystitis 08/24/2018   Osteoarthritis of right knee    Vertigo    DDD (degenerative disc disease), cervical    Benign essential HTN    History of DVT (deep vein thrombosis)    Tachycardia    Steroid-induced hyperglycemia    Hypothyroidism    Neuropathic pain    Acute blood loss anemia    S/P lumbar fusion    Closed L5 vertebral fracture (HCC) 08/31/2017   Closed fracture of fifth lumbar vertebra (HCC) 08/30/2017   Lumbar vertebral fracture (HCC) 07/17/2017   Scoliosis 06/13/2017    PCP: Burton Apley, MD  REFERRING PROVIDER: Sherlean Foot, MD  REFERRING DIAG: S/P left TKA  THERAPY DIAG:  Acute pain of left knee  Localized edema  Difficulty in walking, not elsewhere classified  Stiffness of left knee, not elsewhere classified  Muscle weakness (generalized)  Rationale for Evaluation and Treatment: Rehabilitation  ONSET DATE: 10/17/22  SUBJECTIVE:   SUBJECTIVE STATEMENT: "Im sore, Ive been walking a lot"   PERTINENT HISTORY: See above PAIN:  Are you  having pain? Yes: NPRS scale: 5/10 Pain location: left knee Pain description: ache, deep Aggravating factors: night, bending, being up on the leg pain a 10/10 Relieving factors: sit down, elevate, pain medication, ice pain can be 0/10  PRECAUTIONS: None  RED FLAGS: None   WEIGHT BEARING RESTRICTIONS: No  FALLS:  Has patient fallen in last 6 months? No  LIVING ENVIRONMENT: Lives with: lives with their family and lives with their spouse Lives in: House/apartment Stairs: Yes: Internal: 12 steps; can reach both Has following equipment at home: Walker - 4 wheeled, Shower bench, and bed side commode  OCCUPATION: retired  PLOF: Independent and was using a SPC, she was doing some cooking and cleaning  PATIENT GOALS: have good ROM, less pain, walk better, go up and down stairs  NEXT MD VISIT: 11/30/22  OBJECTIVE:   DIAGNOSTIC FINDINGS: s/p left TKA  PATIENT SURVEYS:  FOTO 6.5  COGNITION: Overall cognitive status: Within functional limits for tasks assessed     SENSATION: WFL Numbness laterally  EDEMA:  Circumferential: right mid patella 46 cm, left 55 cm  PALPATION: Very swollen in the knee, the shin and the foot, very tender to palpation in the thigh, knee and shin, very sensitive where the tourniquet was  LOWER EXTREMITY ROM:  Active ROM Left AROM eval Left PROM eval Left AROM  Sitting 11/17/22  Hip flexion     Hip extension     Hip abduction     Hip adduction     Hip internal rotation     Hip external rotation     Knee flexion 80 85 120  Knee extension 27 20 5   Ankle dorsiflexion     Ankle plantarflexion     Ankle inversion     Ankle eversion      (Blank rows = not tested)  LOWER EXTREMITY MMT:  MMT Right eval Left eval  Hip flexion    Hip extension    Hip abduction    Hip adduction    Hip internal rotation    Hip external rotation    Knee flexion  3-  Knee extension  2  Ankle dorsiflexion    Ankle plantarflexion    Ankle inversion     Ankle eversion     (Blank rows = not tested) FUNCTIONAL TESTS:  Timed up and go (TUG): 51 seconds  GAIT: Distance walked: 50 feet Assistive device utilized: Walker - 2 wheeled Level of assistance: SBA Comments: slow and guarded, small steps   TODAY'S TREATMENT:                                                                                                                              DATE:   12/06/22 L knee PROM flexion and extensions NuStep L only L 5 x 6 min S2S holding red ball x10 then x5  LAQ LLE 3lb 2x10 HS curls green LLE 2x12 Step up 4in box in RW x10 then 6in box x10 Vaso 10 mins high pressure 36 degrees F in elevation    11/24/22 Nustep level 4 x 7 minutes Gentle STM to bilateral knees where she fell PAROM to bilateral knees Red tband HS curls both 3# LAQ with cues for TKE Ball b/n knees squeeze Red tband clamshells Gait with SPC x 200 feet CGA Gait with SPC and CGA outs the back door, negotiated 3 curbs up and down Vaso 10 mins high pressure 36 degrees F in elevation  11/17/22 AROM sitting 5-120 STW to left knee and quad for swelling and pain relief TKE with manual resistance 2 sets 10 LAQ with PTA keeping thigh down to not use hip flexor 2 sets 10. Hold 3 sec TKE Nustep L 3 LE only 4 inch LLE step up 10 x fwd and latrally- CG A with walker HS curl  20 # 10 x BIL. 15# LLE only Knee ext 2 sets 5 up with 2 eccentric LLE lowers Amb with SPC CG-minA 100 feet,BIL hip weakness and positive trendelumberg  VASO after for swelling with again educ on need ot elevate throughout the day for swelling   11/15/22 Nustep level 4 x 6 minutes, Then Level 3 LE only x3 min PROM of the left knee, gentle STM and scar massage and  STM L quad LAQ LLE 2x10 Hamstring curls green 2x10 Steps 4in alt pattern 2 rails 15 steps  Fitter press 2 blue 2x15 LLE  Vaso High pressure in elevation 36 degrees F  11/10/22 Nustep level 4 x 6 minutes HS curls green LLE 2x12 LAQ 2x10 LLE   PROM of the left knee, gentle STM and scar massage left knee S2S HHA x2 2x10 SAQ 2lb 2x10 Vaso High pressure in elevation 36 degrees F  PATIENT EDUCATION:  Education details: HEP/POC Person educated: Patient Education method: Programmer, multimedia, Facilities manager, Verbal cues, and Handouts Education comprehension: verbalized understanding  HOME EXERCISE PROGRAM: Access Code: [email or web address] URL: [email or web address] Date: 10/27/2022 Prepared by: Stacie Glaze  Exercises - Seated March  - 2 x daily - 7 x weekly - 1 sets - 10 reps - 3 hold - Seated Long Arc Quad  - 2 x daily - 7 x weekly - 1 sets - 10 reps - 3 hold - Seated Heel Raise  - 2 x daily - 7 x weekly - 1 sets - 10 reps - 3 hold - Seated Knee Flexion Stretch  - 1 x daily - 7 x weekly - 1 sets - 10 reps - 15 hold  ASSESSMENT:  CLINICAL IMPRESSION: Pt enters doing well. Session consisted of L knee ROM and strength. She has good passive flexion and extensions ROM, pt unable to achieve her full available active L knee flexion. Cues for anterior weight shifts needed with sit to stands to prevent posterior leaning. Pt appears to maintain a better upright posture with LAQ. Pt did well with step ups, cue given to rely less on UE.   OBJECTIVE IMPAIRMENTS: Abnormal gait, cardiopulmonary status limiting activity, decreased activity tolerance, decreased balance, decreased endurance, decreased mobility, difficulty walking, decreased ROM, decreased strength, increased edema, impaired flexibility, improper body mechanics, and pain.   REHAB POTENTIAL: Good  CLINICAL DECISION MAKING: Stable/uncomplicated  EVALUATION COMPLEXITY: Low   GOALS: Goals reviewed with patient? Yes  SHORT TERM GOALS: Target date: 11/11/22 Independent with initial HEP Goal status: met 11/08/22  LONG TERM GOALS: Target date: 01/27/23  Independent with advanced HEP Goal status: INITIAL  2.  Decrease pain 50% Goal status:ongoing 11/08/22  3.  Increase AROM  to 10-110 degrees flexion Goal status: 11/17/22 MET 5-120  4.  Go up and down stairs safely step over step with hand rails Goal status: ongoing 11/24/22  5.  Decrease swelling by 4cm Goal status: INITIAL  6.  Decrease TUG to 24 seconds Goal status: ongoing 11/24/22   PLAN:  PT FREQUENCY: 1-2x/week  PT DURATION: 12 weeks  PLANNED INTERVENTIONS: Therapeutic exercises, Therapeutic activity, Neuromuscular re-education, Balance training, Gait training, Patient/Family education, Self Care, Joint mobilization, Joint manipulation, Stair training, Electrical stimulation, Vasopneumatic device, and Manual therapy  PLAN FOR NEXT SESSION:  strength QUAD and hips, ROM for TKE. function and gait   Grayce Sessions, PTA 12/06/2022, 3:55 PM [facility] [address] [facility]. [place], [place], [address] Phone: [phone]   Fax:  [phone]

## 2022-12-08 ENCOUNTER — Encounter: Payer: Self-pay | Admitting: Physical Therapy

## 2022-12-08 ENCOUNTER — Ambulatory Visit: Payer: Medicare Other | Admitting: Physical Therapy

## 2022-12-08 DIAGNOSIS — R262 Difficulty in walking, not elsewhere classified: Secondary | ICD-10-CM

## 2022-12-08 DIAGNOSIS — M25662 Stiffness of left knee, not elsewhere classified: Secondary | ICD-10-CM

## 2022-12-08 DIAGNOSIS — M25562 Pain in left knee: Secondary | ICD-10-CM

## 2022-12-08 DIAGNOSIS — M6281 Muscle weakness (generalized): Secondary | ICD-10-CM

## 2022-12-08 DIAGNOSIS — R600 Localized edema: Secondary | ICD-10-CM

## 2022-12-08 NOTE — Therapy (Signed)
OUTPATIENT PHYSICAL THERAPY LOWER EXTREMITY TREATMENT Progress Note Reporting Period 10/27/22 to 12/08/22  See note below for Objective Data and Assessment of Progress/Goals.      Patient Name: [name] MRN: [id number] DOB:[birth year], 89 y.o., female Today's Date: 12/08/2022  END OF SESSION:  PT End of Session - 12/08/22 1542     Visit Number 10    Date for PT Re-Evaluation 01/27/23    PT Start Time 1545    PT Stop Time 1630    PT Time Calculation (min) 45 min    Activity Tolerance Patient tolerated treatment well    Behavior During Therapy [facility] for tasks assessed/performed              Past Medical History:  Diagnosis Date   Acalculous cholecystitis 08/24/2018   Arthritis    DVT of lower extremity (deep venous thrombosis) (HCC)    right leg - after back surgery in 2019   GERD (gastroesophageal reflux disease)    History of blood transfusion    Hypothyroidism    PONV (postoperative nausea and vomiting)    Ptosis of both eyelids    Vertigo    Wears glasses    reading   Past Surgical History:  Procedure Laterality Date   ABDOMINAL HYSTERECTOMY  1986   APPLICATION OF ROBOTIC ASSISTANCE FOR SPINAL PROCEDURE N/A 07/17/2017   Procedure: APPLICATION OF ROBOTIC ASSISTANCE FOR SPINAL PROCEDURE;  Surgeon: Barnett Abu, MD;  Location: [facility] OR;  Service: Neurosurgery;  Laterality: N/A;   APPLICATION OF ROBOTIC ASSISTANCE FOR SPINAL PROCEDURE N/A 09/01/2017   Procedure: APPLICATION OF ROBOTIC ASSISTANCE FOR SPINAL PROCEDURE;  Surgeon: Barnett Abu, MD;  Location: [facility] OR;  Service: Neurosurgery;  Laterality: N/A;   APPLICATION OF ROBOTIC ASSISTANCE FOR SPINAL PROCEDURE N/A 10/27/2020   Procedure: APPLICATION OF ROBOTIC ASSISTANCE FOR SPINAL PROCEDURE;  Surgeon: Barnett Abu, MD;  Location: [facility] OR;  Service: Neurosurgery;  Laterality: N/A;   BACK SURGERY  1986   lumb lam   BREAST BIOPSY Right 2011   CARPAL TUNNEL RELEASE  2000   right   CARPAL TUNNEL RELEASE  2012    left   CHOLECYSTECTOMY N/A 08/24/2018   Procedure: LAPAROSCOPIC CHOLECYSTECTOMY WITH INTRAOPERATIVE CHOLANGIOGRAM;  Surgeon: Claud Kelp, MD;  Location: [facility] OR;  Service: General;  Laterality: N/A;   COLONOSCOPY     CYSTOCELE REPAIR  2007   sling   DISTAL INTERPHALANGEAL JOINT FUSION Right 02/04/2014   Procedure: FUSION DISTAL INTERPHALANGEAL JOINT RIGHT INDEX FINGER ;  Surgeon: Cindee Salt, MD;  Location: [facility];  Service: Orthopedics;  Laterality: Right;   EYE SURGERY Bilateral    Cataract surgery with lens implant   KNEE SURGERY  2009,2011   partial knee   LAPAROSCOPY N/A 08/24/2018   Procedure: LAPAROSCOPY DIAGNOSTIC;  Surgeon: Claud Kelp, MD;  Location: [facility] OR;  Service: General;  Laterality: N/A;   LUMBAR PERCUTANEOUS PEDICLE SCREW 4 LEVEL N/A 10/27/2020   Procedure: Thoracic nine-ten Laminectomy with extension of fusion from Thoracic ten to Thoracic six with segmental fixation (cemented) with pedicle screw and mazor;  Surgeon: Barnett Abu, MD;  Location: [facility] OR;  Service: Neurosurgery;  Laterality: N/A;   OPEN REDUCTION INTERNAL FIXATION (ORIF) PROXIMAL PHALANX Left 08/30/2013   Procedure: OPEN REDUCTION INTERNAL FIXATION (ORIF) PROXIMAL PHALANX FRACTURE LEFT SMALL FINGER; SPLINT RING FINGER;  Surgeon: Nicki Reaper, MD;  Location: [facility];  Service: Orthopedics;  Laterality: Left;   POSTERIOR LUMBAR FUSION 4 LEVEL N/A 07/17/2017   Procedure:  Thoracic Ten - Lumbar Five revison of hardware with Mazor;  Surgeon: Barnett Abu, MD;  Location: [facility] OR;  Service: Neurosurgery;  Laterality: N/A;  Thoracic/Lumbar   PTOSIS REPAIR Bilateral 07/27/2015   Procedure: PTOSIS REPAIR;  Surgeon: Louisa Second, MD;  Location: [facility];  Service: Plastics;  Laterality: Bilateral;   SHOULDER SURGERY     rt rcr,and lt   TENOLYSIS Left 05/21/2021   Procedure: RELEASE EXTENSOR POLLICIS LONGUS LEFT;  Surgeon: Cindee Salt, MD;  Location: [facility];  Service: Orthopedics;  Laterality: Left;   TENOLYSIS Left 06/08/2021   Procedure: RELEASE EXTENSOR POLLICIS LONGUS LEFT;  Surgeon: Cindee Salt, MD;  Location: [facility];  Service: Orthopedics;  Laterality: Left;   TONSILLECTOMY     TOTAL KNEE ARTHROPLASTY Right 12/23/2019   Procedure: TOTAL KNEE ARTHROPLASTY;  Surgeon: Dannielle Huh, MD;  Location: [facility];  Service: Orthopedics;  Laterality: Right;   TRIGGER FINGER RELEASE Left 05/21/2021   Procedure: RELEASE TRIGGER FINGER/A-1 PULLEY LEFT MIDDLE FINGER;  Surgeon: Cindee Salt, MD;  Location: [facility];  Service: Orthopedics;  Laterality: Left;   TRIGGER FINGER RELEASE Left 06/08/2021   Procedure: RELEASE A-1 PULLEY LEFT MIDDLE FINGER;  Surgeon: Cindee Salt, MD;  Location: [facility];  Service: Orthopedics;  Laterality: Left;   Patient Active Problem List   Diagnosis Date Noted   Myelopathy concurrent with and due to spinal stenosis of thoracic region (HCC) 10/27/2020   S/P total knee arthroplasty, right 12/23/2019   Acalculous cholecystitis 08/24/2018   Osteoarthritis of right knee    Vertigo    DDD (degenerative disc disease), cervical    Benign essential HTN    History of DVT (deep vein thrombosis)    Tachycardia    Steroid-induced hyperglycemia    Hypothyroidism    Neuropathic pain    Acute blood loss anemia    S/P lumbar fusion    Closed L5 vertebral fracture (HCC) 08/31/2017   Closed fracture of fifth lumbar vertebra (HCC) 08/30/2017   Lumbar vertebral fracture (HCC) 07/17/2017   Scoliosis 06/13/2017    PCP: Burton Apley, MD  REFERRING PROVIDER: Sherlean Foot, MD  REFERRING DIAG: S/P left TKA  THERAPY DIAG:  No diagnosis found.  Rationale for Evaluation and Treatment: Rehabilitation  ONSET DATE: 10/17/22  SUBJECTIVE:   SUBJECTIVE STATEMENT: "Going good" Knee woke her up last night for some reason  PERTINENT HISTORY: See above PAIN:  Are you having pain?  Yes: NPRS scale: 0/10 Pain location: left knee Pain description: ache, deep Aggravating factors: night, bending, being up on the leg pain a 10/10 Relieving factors: sit down, elevate, pain medication, ice pain can be 0/10  PRECAUTIONS: None  RED FLAGS: None   WEIGHT BEARING RESTRICTIONS: No  FALLS:  Has patient fallen in last 6 months? No  LIVING ENVIRONMENT: Lives with: lives with their family and lives with their spouse Lives in: House/apartment Stairs: Yes: Internal: 12 steps; can reach both Has following equipment at home: Walker - 4 wheeled, Shower bench, and bed side commode  OCCUPATION: retired  PLOF: Independent and was using a SPC, she was doing some cooking and cleaning  PATIENT GOALS: have good ROM, less pain, walk better, go up and down stairs  NEXT MD VISIT: 11/30/22  OBJECTIVE:   DIAGNOSTIC FINDINGS: s/p left TKA  PATIENT SURVEYS:  FOTO 6.5  COGNITION: Overall cognitive status: Within functional limits for tasks assessed     SENSATION: WFL Numbness laterally  EDEMA:  Circumferential: right mid patella 46 cm, left 55 cm  PALPATION: Very swollen in the knee, the shin and the foot, very tender to palpation in the thigh, knee and shin, very sensitive where the tourniquet was  LOWER EXTREMITY ROM:  Active ROM Left AROM eval Left PROM eval Left AROM  Sitting 11/17/22 L knee AROM 12/08/22  Hip flexion      Hip extension      Hip abduction      Hip adduction      Hip internal rotation      Hip external rotation      Knee flexion 80 85 120 125  Knee extension 27 20 5 8   Ankle dorsiflexion      Ankle plantarflexion      Ankle inversion      Ankle eversion       (Blank rows = not tested)  LOWER EXTREMITY MMT:  MMT Right eval Left eval  Hip flexion    Hip extension    Hip abduction    Hip adduction    Hip internal rotation    Hip external rotation    Knee flexion  3-  Knee extension  2  Ankle dorsiflexion    Ankle plantarflexion     Ankle inversion    Ankle eversion     (Blank rows = not tested) FUNCTIONAL TESTS:  Timed up and go (TUG): 51 seconds  GAIT: Distance walked: 50 feet Assistive device utilized: Walker - 2 wheeled Level of assistance: SBA Comments: slow and guarded, small steps   TODAY'S TREATMENT:                                                                                                                              DATE:   12/08/22 NuStep L only L 5 x 6 min Checked Goals  ROM  TUG  FOTO  Stairs 4in two rails, cues for alternating pattern,12 steps,  Gait w/ SPC 68ft x 2 In RW hip abd & marching 2lb cuff 2x10  2lb cuff alt 4in box taps 2x10 LLE LAQ 2lb 2x10 Vaso 10 mins high pressure 36 degrees F in elevation  12/06/22 L knee PROM flexion and extensions NuStep L only L 5 x 6 min S2S holding red ball x10 then x5  LAQ LLE 3lb 2x10 HS curls green LLE 2x12 Step up 4in box in RW x10 then 6in box x10 Vaso 10 mins high pressure 36 degrees F in elevation    11/24/22 Nustep level 4 x 7 minutes Gentle STM to bilateral knees where she fell PAROM to bilateral knees Red tband HS curls both 3# LAQ with cues for TKE Ball b/n knees squeeze Red tband clamshells Gait with SPC x 200 feet CGA Gait with SPC and CGA outs the back door, negotiated 3 curbs up and down Vaso 10 mins high pressure 36 degrees F in elevation  11/17/22 AROM sitting 5-120 STW to left knee and quad for  swelling and pain relief TKE with manual resistance 2 sets 10 LAQ with PTA keeping thigh down to not use hip flexor 2 sets 10. Hold 3 sec TKE Nustep L 3 LE only 4 inch LLE step up 10 x fwd and latrally- CG A with walker HS curl  20 # 10 x BIL. 15# LLE only Knee ext 2 sets 5 up with 2 eccentric LLE lowers Amb with SPC CG-minA 100 feet,BIL hip weakness and positive trendelumberg VASO after for swelling with again educ on need ot elevate throughout the day for swelling   11/15/22 Nustep level 4 x 6 minutes, Then Level 3  LE only x3 min PROM of the left knee, gentle STM and scar massage and  STM L quad LAQ LLE 2x10 Hamstring curls green 2x10 Steps 4in alt pattern 2 rails 15 steps  Fitter press 2 blue 2x15 LLE  Vaso High pressure in elevation 36 degrees F  11/10/22 Nustep level 4 x 6 minutes HS curls green LLE 2x12 LAQ 2x10 LLE  PROM of the left knee, gentle STM and scar massage left knee S2S HHA x2 2x10 SAQ 2lb 2x10 Vaso High pressure in elevation 36 degrees F  PATIENT EDUCATION:  Education details: HEP/POC Person educated: Patient Education method: Programmer, multimedia, Facilities manager, Verbal cues, and Handouts Education comprehension: verbalized understanding  HOME EXERCISE PROGRAM: Access Code: [email or web address] URL: [email or web address] Date: 10/27/2022 Prepared by: Stacie Glaze  Exercises - Seated March  - 2 x daily - 7 x weekly - 1 sets - 10 reps - 3 hold - Seated Long Arc Quad  - 2 x daily - 7 x weekly - 1 sets - 10 reps - 3 hold - Seated Heel Raise  - 2 x daily - 7 x weekly - 1 sets - 10 reps - 3 hold - Seated Knee Flexion Stretch  - 1 x daily - 7 x weekly - 1 sets - 10 reps - 15 hold  ASSESSMENT:  CLINICAL IMPRESSION: Pt enters doing well. Session consisted of L knee ROM and strength. She has progressed towards both long and short term goals. She is most limited with L quad strength and L knee extension ROM. She has good passive extensions ROM. Trendelenburg gait on R hip when ambulating with SPC.  Cue not tot to use UE so much with stairs, and rely on LLE. Pt would benefit for continues skilled PT services to address deficits.     OBJECTIVE IMPAIRMENTS: Abnormal gait, cardiopulmonary status limiting activity, decreased activity tolerance, decreased balance, decreased endurance, decreased mobility, difficulty walking, decreased ROM, decreased strength, increased edema, impaired flexibility, improper body mechanics, and pain.   REHAB POTENTIAL: Good  CLINICAL DECISION MAKING:  Stable/uncomplicated  EVALUATION COMPLEXITY: Low   GOALS: Goals reviewed with patient? Yes  SHORT TERM GOALS: Target date: 11/11/22 Independent with initial HEP Goal status: met 11/08/22  LONG TERM GOALS: Target date: 01/27/23  Independent with advanced HEP Goal status: INITIAL  2.  Decrease pain 50% Goal status:ongoing 11/08/22  3.  Increase AROM to 10-110 degrees flexion Goal status: 11/17/22 MET 5-120  4.  Go up and down stairs safely step over step with hand rails Goal status: Progressing 12/08/22  5.  Decrease swelling by 4cm Goal status: INITIAL  6.  Decrease TUG to 24 seconds Goal status: Met  17.57 12/08/22   PLAN:  PT FREQUENCY: 1-2x/week  PT DURATION: 12 weeks  PLANNED INTERVENTIONS: Therapeutic exercises, Therapeutic activity, Neuromuscular re-education, Balance training, Gait training, Patient/Family education, Self Care, Joint mobilization,  Joint manipulation, Stair training, Electrical stimulation, Vasopneumatic device, and Manual therapy  PLAN FOR NEXT SESSION:  strength QUAD and hips, ROM for TKE. function and gait   Grayce Sessions, PTA

## 2022-12-13 ENCOUNTER — Encounter: Payer: Self-pay | Admitting: Physical Therapy

## 2022-12-13 ENCOUNTER — Ambulatory Visit: Payer: Medicare Other | Admitting: Physical Therapy

## 2022-12-13 DIAGNOSIS — R600 Localized edema: Secondary | ICD-10-CM

## 2022-12-13 DIAGNOSIS — R262 Difficulty in walking, not elsewhere classified: Secondary | ICD-10-CM

## 2022-12-13 DIAGNOSIS — M25562 Pain in left knee: Secondary | ICD-10-CM | POA: Diagnosis not present

## 2022-12-13 DIAGNOSIS — M25662 Stiffness of left knee, not elsewhere classified: Secondary | ICD-10-CM

## 2022-12-13 DIAGNOSIS — M6281 Muscle weakness (generalized): Secondary | ICD-10-CM

## 2022-12-13 NOTE — Therapy (Signed)
OUTPATIENT PHYSICAL THERAPY LOWER EXTREMITY TREATMENT    Patient Name: [name] MRN: [id number] DOB:Oct 01, 1932, 89 y.o., female Today's Date: 12/13/2022  END OF SESSION:  PT End of Session - 12/13/22 1557     Visit Number 11    Date for PT Re-Evaluation 01/27/23    PT Start Time 1600    PT Stop Time 1645    PT Time Calculation (min) 45 min    Activity Tolerance Patient tolerated treatment well    Behavior During Therapy [facility] for tasks assessed/performed              Past Medical History:  Diagnosis Date   Acalculous cholecystitis 08/24/2018   Arthritis    DVT of lower extremity (deep venous thrombosis) (HCC)    right leg - after back surgery in 2019   GERD (gastroesophageal reflux disease)    History of blood transfusion    Hypothyroidism    PONV (postoperative nausea and vomiting)    Ptosis of both eyelids    Vertigo    Wears glasses    reading   Past Surgical History:  Procedure Laterality Date   ABDOMINAL HYSTERECTOMY  1986   APPLICATION OF ROBOTIC ASSISTANCE FOR SPINAL PROCEDURE N/A 07/17/2017   Procedure: APPLICATION OF ROBOTIC ASSISTANCE FOR SPINAL PROCEDURE;  Surgeon: Barnett Abu, MD;  Location: [facility] OR;  Service: Neurosurgery;  Laterality: N/A;   APPLICATION OF ROBOTIC ASSISTANCE FOR SPINAL PROCEDURE N/A 09/01/2017   Procedure: APPLICATION OF ROBOTIC ASSISTANCE FOR SPINAL PROCEDURE;  Surgeon: Barnett Abu, MD;  Location: [facility] OR;  Service: Neurosurgery;  Laterality: N/A;   APPLICATION OF ROBOTIC ASSISTANCE FOR SPINAL PROCEDURE N/A 10/27/2020   Procedure: APPLICATION OF ROBOTIC ASSISTANCE FOR SPINAL PROCEDURE;  Surgeon: Barnett Abu, MD;  Location: [facility] OR;  Service: Neurosurgery;  Laterality: N/A;   BACK SURGERY  1986   lumb lam   BREAST BIOPSY Right 2011   CARPAL TUNNEL RELEASE  2000   right   CARPAL TUNNEL RELEASE  2012   left   CHOLECYSTECTOMY N/A 08/24/2018   Procedure: LAPAROSCOPIC CHOLECYSTECTOMY WITH INTRAOPERATIVE CHOLANGIOGRAM;   Surgeon: Claud Kelp, MD;  Location: [facility] OR;  Service: General;  Laterality: N/A;   COLONOSCOPY     CYSTOCELE REPAIR  2007   sling   DISTAL INTERPHALANGEAL JOINT FUSION Right 02/04/2014   Procedure: FUSION DISTAL INTERPHALANGEAL JOINT RIGHT INDEX FINGER ;  Surgeon: Cindee Salt, MD;  Location: [facility];  Service: Orthopedics;  Laterality: Right;   EYE SURGERY Bilateral    Cataract surgery with lens implant   KNEE SURGERY  2009,2011   partial knee   LAPAROSCOPY N/A 08/24/2018   Procedure: LAPAROSCOPY DIAGNOSTIC;  Surgeon: Claud Kelp, MD;  Location: [facility] OR;  Service: General;  Laterality: N/A;   LUMBAR PERCUTANEOUS PEDICLE SCREW 4 LEVEL N/A 10/27/2020   Procedure: Thoracic nine-ten Laminectomy with extension of fusion from Thoracic ten to Thoracic six with segmental fixation (cemented) with pedicle screw and mazor;  Surgeon: Barnett Abu, MD;  Location: [facility] OR;  Service: Neurosurgery;  Laterality: N/A;   OPEN REDUCTION INTERNAL FIXATION (ORIF) PROXIMAL PHALANX Left 08/30/2013   Procedure: OPEN REDUCTION INTERNAL FIXATION (ORIF) PROXIMAL PHALANX FRACTURE LEFT SMALL FINGER; SPLINT RING FINGER;  Surgeon: Nicki Reaper, MD;  Location: [facility];  Service: Orthopedics;  Laterality: Left;   POSTERIOR LUMBAR FUSION 4 LEVEL N/A 07/17/2017   Procedure: Thoracic Ten - Lumbar Five revison of hardware with Mazor;  Surgeon: Barnett Abu, MD;  Location: [facility] OR;  Service: Neurosurgery;  Laterality: N/A;  Thoracic/Lumbar   PTOSIS REPAIR Bilateral 07/27/2015   Procedure: PTOSIS REPAIR;  Surgeon: Louisa Second, MD;  Location: [facility];  Service: Plastics;  Laterality: Bilateral;   SHOULDER SURGERY     rt rcr,and lt   TENOLYSIS Left 05/21/2021   Procedure: RELEASE EXTENSOR POLLICIS LONGUS LEFT;  Surgeon: Cindee Salt, MD;  Location: [facility];  Service: Orthopedics;  Laterality: Left;   TENOLYSIS Left 06/08/2021   Procedure: RELEASE EXTENSOR  POLLICIS LONGUS LEFT;  Surgeon: Cindee Salt, MD;  Location: [facility];  Service: Orthopedics;  Laterality: Left;   TONSILLECTOMY     TOTAL KNEE ARTHROPLASTY Right 12/23/2019   Procedure: TOTAL KNEE ARTHROPLASTY;  Surgeon: Dannielle Huh, MD;  Location: [facility];  Service: Orthopedics;  Laterality: Right;   TRIGGER FINGER RELEASE Left 05/21/2021   Procedure: RELEASE TRIGGER FINGER/A-1 PULLEY LEFT MIDDLE FINGER;  Surgeon: Cindee Salt, MD;  Location: [facility];  Service: Orthopedics;  Laterality: Left;   TRIGGER FINGER RELEASE Left 06/08/2021   Procedure: RELEASE A-1 PULLEY LEFT MIDDLE FINGER;  Surgeon: Cindee Salt, MD;  Location:  SURGERY CENTER;  Service: Orthopedics;  Laterality: Left;   Patient Active Problem List   Diagnosis Date Noted   Myelopathy concurrent with and due to spinal stenosis of thoracic region (HCC) 10/27/2020   S/P total knee arthroplasty, right 12/23/2019   Acalculous cholecystitis 08/24/2018   Osteoarthritis of right knee    Vertigo    DDD (degenerative disc disease), cervical    Benign essential HTN    History of DVT (deep vein thrombosis)    Tachycardia    Steroid-induced hyperglycemia    Hypothyroidism    Neuropathic pain    Acute blood loss anemia    S/P lumbar fusion    Closed L5 vertebral fracture (HCC) 08/31/2017   Closed fracture of fifth lumbar vertebra (HCC) 08/30/2017   Lumbar vertebral fracture (HCC) 07/17/2017   Scoliosis 06/13/2017    PCP: Burton Apley, MD  REFERRING PROVIDER: Sherlean Foot, MD  REFERRING DIAG: S/P left TKA  THERAPY DIAG:  Acute pain of left knee  Localized edema  Difficulty in walking, not elsewhere classified  Stiffness of left knee, not elsewhere classified  Muscle weakness (generalized)  Rationale for Evaluation and Treatment: Rehabilitation  ONSET DATE: 10/17/22  SUBJECTIVE:   SUBJECTIVE STATEMENT: Feel pretty good PERTINENT HISTORY: See above PAIN:  Are you having pain?  Yes: NPRS scale: 4/10 Pain location: left knee Pain description: ache, deep Aggravating factors: night, bending, being up on the leg pain a 10/10 Relieving factors: sit down, elevate, pain medication, ice pain can be 0/10  PRECAUTIONS: None  RED FLAGS: None   WEIGHT BEARING RESTRICTIONS: No  FALLS:  Has patient fallen in last 6 months? No  LIVING ENVIRONMENT: Lives with: lives with their family and lives with their spouse Lives in: House/apartment Stairs: Yes: Internal: 12 steps; can reach both Has following equipment at home: Walker - 4 wheeled, Shower bench, and bed side commode  OCCUPATION: retired  PLOF: Independent and was using a SPC, she was doing some cooking and cleaning  PATIENT GOALS: have good ROM, less pain, walk better, go up and down stairs  NEXT MD VISIT: 11/30/22  OBJECTIVE:   DIAGNOSTIC FINDINGS: s/p left TKA  PATIENT SURVEYS:  FOTO 6.5  COGNITION: Overall cognitive status: Within functional limits for tasks assessed     SENSATION: WFL Numbness laterally  EDEMA:  Circumferential: right mid patella 46  cm, left 55 cm  PALPATION: Very swollen in the knee, the shin and the foot, very tender to palpation in the thigh, knee and shin, very sensitive where the tourniquet was  LOWER EXTREMITY ROM:  Active ROM Left AROM eval Left PROM eval Left AROM  Sitting 11/17/22 L knee AROM 12/08/22  Hip flexion      Hip extension      Hip abduction      Hip adduction      Hip internal rotation      Hip external rotation      Knee flexion 80 85 120 125  Knee extension 27 20 5 8   Ankle dorsiflexion      Ankle plantarflexion      Ankle inversion      Ankle eversion       (Blank rows = not tested)  LOWER EXTREMITY MMT:  MMT Right eval Left eval  Hip flexion    Hip extension    Hip abduction    Hip adduction    Hip internal rotation    Hip external rotation    Knee flexion  3-  Knee extension  2  Ankle dorsiflexion    Ankle plantarflexion     Ankle inversion    Ankle eversion     (Blank rows = not tested) FUNCTIONAL TESTS:  Timed up and go (TUG): 51 seconds  GAIT: Distance walked: 50 feet Assistive device utilized: Walker - 2 wheeled Level of assistance: SBA Comments: slow and guarded, small steps   TODAY'S TREATMENT:                                                                                                                              DATE:   12/13/22 NuStep L only L 5 x 6 min Leg press 20lb 2x10 HS curls 35lb 2x10, LLE 15lb x10 Leg Ext 10lb 2x10, LLE 5lb eccentric x10 S2S holding red ball 2x0 with progressing lowering Alt 2in box taps HHA x 1 2x5 each Gati 72ft w/ [facility]   12/08/22 NuStep L only L 5 x 6 min Checked Goals  ROM  TUG  FOTO  Stairs 4in two rails, cues for alternating pattern,12 steps,  Gait w/ SPC 83ft x 2 In RW hip abd & marching 2lb cuff 2x10  2lb cuff alt 4in box taps 2x10 LLE LAQ 2lb 2x10 Vaso 10 mins high pressure 36 degrees F in elevation  12/06/22 L knee PROM flexion and extensions NuStep L only L 5 x 6 min S2S holding red ball x10 then x5  LAQ LLE 3lb 2x10 HS curls green LLE 2x12 Step up 4in box in RW x10 then 6in box x10 Vaso 10 mins high pressure 36 degrees F in elevation    11/24/22 Nustep level 4 x 7 minutes Gentle STM to bilateral knees where she fell PAROM to bilateral knees Red tband HS curls both 3# LAQ with cues for TKE Ball b/n knees squeeze Red  tband clamshells Gait with SPC x 200 feet CGA Gait with SPC and CGA outs the back door, negotiated 3 curbs up and down Vaso 10 mins high pressure 36 degrees F in elevation  11/17/22 AROM sitting 5-120 STW to left knee and quad for swelling and pain relief TKE with manual resistance 2 sets 10 LAQ with PTA keeping thigh down to not use hip flexor 2 sets 10. Hold 3 sec TKE Nustep L 3 LE only 4 inch LLE step up 10 x fwd and latrally- CG A with walker HS curl  20 # 10 x BIL. 15# LLE only Knee ext 2 sets 5 up with 2  eccentric LLE lowers Amb with SPC CG-minA 100 feet,BIL hip weakness and positive trendelumberg VASO after for swelling with again educ on need ot elevate throughout the day for swelling   11/15/22 Nustep level 4 x 6 minutes, Then Level 3 LE only x3 min PROM of the left knee, gentle STM and scar massage and  STM L quad LAQ LLE 2x10 Hamstring curls green 2x10 Steps 4in alt pattern 2 rails 15 steps  Fitter press 2 blue 2x15 LLE  Vaso High pressure in elevation 36 degrees F  11/10/22 Nustep level 4 x 6 minutes HS curls green LLE 2x12 LAQ 2x10 LLE  PROM of the left knee, gentle STM and scar massage left knee S2S HHA x2 2x10 SAQ 2lb 2x10 Vaso High pressure in elevation 36 degrees F  PATIENT EDUCATION:  Education details: HEP/POC Person educated: Patient Education method: Programmer, multimedia, Facilities manager, Verbal cues, and Handouts Education comprehension: verbalized understanding  HOME EXERCISE PROGRAM: Access Code: [email or web address] URL: [email or web address] Date: 10/27/2022 Prepared by: Stacie Glaze  Exercises - Seated March  - 2 x daily - 7 x weekly - 1 sets - 10 reps - 3 hold - Seated Long Arc Quad  - 2 x daily - 7 x weekly - 1 sets - 10 reps - 3 hold - Seated Heel Raise  - 2 x daily - 7 x weekly - 1 sets - 10 reps - 3 hold - Seated Knee Flexion Stretch  - 1 x daily - 7 x weekly - 1 sets - 10 reps - 15 hold  ASSESSMENT:  CLINICAL IMPRESSION: Pt enters doing well. Session consisted of L knee ROM and strength. Added machine level interventions without issue. Cur to control decent more with eccentric leg extensions. She is most limited with L quad strength and L knee extension ROM.  Trendelenburg gait on R hip when ambulating with SPC.  Cue not to allow LE's to push against mat able with sit to stands.. Pt would benefit for continues skilled PT services to address deficits.     OBJECTIVE IMPAIRMENTS: Abnormal gait, cardiopulmonary status limiting activity, decreased activity  tolerance, decreased balance, decreased endurance, decreased mobility, difficulty walking, decreased ROM, decreased strength, increased edema, impaired flexibility, improper body mechanics, and pain.   REHAB POTENTIAL: Good  CLINICAL DECISION MAKING: Stable/uncomplicated  EVALUATION COMPLEXITY: Low   GOALS: Goals reviewed with patient? Yes  SHORT TERM GOALS: Target date: 11/11/22 Independent with initial HEP Goal status: met 11/08/22  LONG TERM GOALS: Target date: 01/27/23  Independent with advanced HEP Goal status: INITIAL  2.  Decrease pain 50% Goal status:ongoing 11/08/22  3.  Increase AROM to 10-110 degrees flexion Goal status: 11/17/22 MET 5-120  4.  Go up and down stairs safely step over step with hand rails Goal status: Progressing 12/08/22  5.  Decrease swelling by 4cm Goal status:  INITIAL  6.  Decrease TUG to 24 seconds Goal status: Met  17.57 12/08/22   PLAN:  PT FREQUENCY: 1-2x/week  PT DURATION: 12 weeks  PLANNED INTERVENTIONS: Therapeutic exercises, Therapeutic activity, Neuromuscular re-education, Balance training, Gait training, Patient/Family education, Self Care, Joint mobilization, Joint manipulation, Stair training, Electrical stimulation, Vasopneumatic device, and Manual therapy  PLAN FOR NEXT SESSION:  strength QUAD and hips, ROM for TKE. function and gait   Grayce Sessions, PTA

## 2022-12-15 ENCOUNTER — Ambulatory Visit: Payer: Medicare Other | Admitting: Physical Therapy

## 2022-12-15 ENCOUNTER — Encounter: Payer: Self-pay | Admitting: Physical Therapy

## 2022-12-15 DIAGNOSIS — R262 Difficulty in walking, not elsewhere classified: Secondary | ICD-10-CM

## 2022-12-15 DIAGNOSIS — M25662 Stiffness of left knee, not elsewhere classified: Secondary | ICD-10-CM

## 2022-12-15 DIAGNOSIS — R600 Localized edema: Secondary | ICD-10-CM

## 2022-12-15 DIAGNOSIS — M6281 Muscle weakness (generalized): Secondary | ICD-10-CM

## 2022-12-15 DIAGNOSIS — M25562 Pain in left knee: Secondary | ICD-10-CM

## 2022-12-15 NOTE — Therapy (Signed)
OUTPATIENT PHYSICAL THERAPY LOWER EXTREMITY TREATMENT    Patient Name: [name] MRN: [id number] DOB:1932-07-23, 89 y.o., female Today's Date: 12/15/2022  END OF SESSION:  PT End of Session - 12/15/22 1618     Visit Number 12    Date for PT Re-Evaluation 01/27/23    Authorization Type [employer] Medicare    PT Start Time 1609    PT Stop Time 1700    PT Time Calculation (min) 51 min    Activity Tolerance Patient tolerated treatment well    Behavior During Therapy WFL for tasks assessed/performed              Past Medical History:  Diagnosis Date   Acalculous cholecystitis 08/24/2018   Arthritis    DVT of lower extremity (deep venous thrombosis) (HCC)    right leg - after back surgery in 2019   GERD (gastroesophageal reflux disease)    History of blood transfusion    Hypothyroidism    PONV (postoperative nausea and vomiting)    Ptosis of both eyelids    Vertigo    Wears glasses    reading   Past Surgical History:  Procedure Laterality Date   ABDOMINAL HYSTERECTOMY  1986   APPLICATION OF ROBOTIC ASSISTANCE FOR SPINAL PROCEDURE N/A 07/17/2017   Procedure: APPLICATION OF ROBOTIC ASSISTANCE FOR SPINAL PROCEDURE;  Surgeon: Barnett Abu, MD;  Location: [facility] OR;  Service: Neurosurgery;  Laterality: N/A;   APPLICATION OF ROBOTIC ASSISTANCE FOR SPINAL PROCEDURE N/A 09/01/2017   Procedure: APPLICATION OF ROBOTIC ASSISTANCE FOR SPINAL PROCEDURE;  Surgeon: Barnett Abu, MD;  Location: [facility] OR;  Service: Neurosurgery;  Laterality: N/A;   APPLICATION OF ROBOTIC ASSISTANCE FOR SPINAL PROCEDURE N/A 10/27/2020   Procedure: APPLICATION OF ROBOTIC ASSISTANCE FOR SPINAL PROCEDURE;  Surgeon: Barnett Abu, MD;  Location: [facility] OR;  Service: Neurosurgery;  Laterality: N/A;   BACK SURGERY  1986   lumb lam   BREAST BIOPSY Right 2011   CARPAL TUNNEL RELEASE  2000   right   CARPAL TUNNEL RELEASE  2012   left   CHOLECYSTECTOMY N/A 08/24/2018   Procedure: LAPAROSCOPIC CHOLECYSTECTOMY WITH  INTRAOPERATIVE CHOLANGIOGRAM;  Surgeon: Claud Kelp, MD;  Location: [facility] OR;  Service: General;  Laterality: N/A;   COLONOSCOPY     CYSTOCELE REPAIR  2007   sling   DISTAL INTERPHALANGEAL JOINT FUSION Right 02/04/2014   Procedure: FUSION DISTAL INTERPHALANGEAL JOINT RIGHT INDEX FINGER ;  Surgeon: Cindee Salt, MD;  Location: [facility];  Service: Orthopedics;  Laterality: Right;   EYE SURGERY Bilateral    Cataract surgery with lens implant   KNEE SURGERY  2009,2011   partial knee   LAPAROSCOPY N/A 08/24/2018   Procedure: LAPAROSCOPY DIAGNOSTIC;  Surgeon: Claud Kelp, MD;  Location: [facility] OR;  Service: General;  Laterality: N/A;   LUMBAR PERCUTANEOUS PEDICLE SCREW 4 LEVEL N/A 10/27/2020   Procedure: Thoracic nine-ten Laminectomy with extension of fusion from Thoracic ten to Thoracic six with segmental fixation (cemented) with pedicle screw and mazor;  Surgeon: Barnett Abu, MD;  Location: [facility] OR;  Service: Neurosurgery;  Laterality: N/A;   OPEN REDUCTION INTERNAL FIXATION (ORIF) PROXIMAL PHALANX Left 08/30/2013   Procedure: OPEN REDUCTION INTERNAL FIXATION (ORIF) PROXIMAL PHALANX FRACTURE LEFT SMALL FINGER; SPLINT RING FINGER;  Surgeon: Nicki Reaper, MD;  Location: [facility];  Service: Orthopedics;  Laterality: Left;   POSTERIOR LUMBAR FUSION 4 LEVEL N/A 07/17/2017   Procedure: Thoracic Ten - Lumbar Five revison of hardware with Mazor;  Surgeon: Danielle Dess,  Sherilyn Cooter, MD;  Location: [facility] OR;  Service: Neurosurgery;  Laterality: N/A;  Thoracic/Lumbar   PTOSIS REPAIR Bilateral 07/27/2015   Procedure: PTOSIS REPAIR;  Surgeon: Louisa Second, MD;  Location: [facility];  Service: Plastics;  Laterality: Bilateral;   SHOULDER SURGERY     rt rcr,and lt   TENOLYSIS Left 05/21/2021   Procedure: RELEASE EXTENSOR POLLICIS LONGUS LEFT;  Surgeon: Cindee Salt, MD;  Location: [facility];  Service: Orthopedics;  Laterality: Left;   TENOLYSIS Left 06/08/2021    Procedure: RELEASE EXTENSOR POLLICIS LONGUS LEFT;  Surgeon: Cindee Salt, MD;  Location: [facility];  Service: Orthopedics;  Laterality: Left;   TONSILLECTOMY     TOTAL KNEE ARTHROPLASTY Right 12/23/2019   Procedure: TOTAL KNEE ARTHROPLASTY;  Surgeon: Dannielle Huh, MD;  Location: [facility];  Service: Orthopedics;  Laterality: Right;   TRIGGER FINGER RELEASE Left 05/21/2021   Procedure: RELEASE TRIGGER FINGER/A-1 PULLEY LEFT MIDDLE FINGER;  Surgeon: Cindee Salt, MD;  Location: [facility];  Service: Orthopedics;  Laterality: Left;   TRIGGER FINGER RELEASE Left 06/08/2021   Procedure: RELEASE A-1 PULLEY LEFT MIDDLE FINGER;  Surgeon: Cindee Salt, MD;  Location:  SURGERY CENTER;  Service: Orthopedics;  Laterality: Left;   Patient Active Problem List   Diagnosis Date Noted   Myelopathy concurrent with and due to spinal stenosis of thoracic region (HCC) 10/27/2020   S/P total knee arthroplasty, right 12/23/2019   Acalculous cholecystitis 08/24/2018   Osteoarthritis of right knee    Vertigo    DDD (degenerative disc disease), cervical    Benign essential HTN    History of DVT (deep vein thrombosis)    Tachycardia    Steroid-induced hyperglycemia    Hypothyroidism    Neuropathic pain    Acute blood loss anemia    S/P lumbar fusion    Closed L5 vertebral fracture (HCC) 08/31/2017   Closed fracture of fifth lumbar vertebra (HCC) 08/30/2017   Lumbar vertebral fracture (HCC) 07/17/2017   Scoliosis 06/13/2017    PCP: Burton Apley, MD  REFERRING PROVIDER: Sherlean Foot, MD  REFERRING DIAG: S/P left TKA  THERAPY DIAG:  Acute pain of left knee  Localized edema  Difficulty in walking, not elsewhere classified  Stiffness of left knee, not elsewhere classified  Muscle weakness (generalized)  Rationale for Evaluation and Treatment: Rehabilitation  ONSET DATE: 10/17/22  SUBJECTIVE:   SUBJECTIVE STATEMENT: Legs are weak, I drove today PERTINENT  HISTORY: See above PAIN:  Are you having pain? Yes: NPRS scale: 3/10 Pain location: left knee Pain description: ache, deep Aggravating factors: night, bending, being up on the leg pain a 10/10 Relieving factors: sit down, elevate, pain medication, ice pain can be 0/10  PRECAUTIONS: None  RED FLAGS: None   WEIGHT BEARING RESTRICTIONS: No  FALLS:  Has patient fallen in last 6 months? No  LIVING ENVIRONMENT: Lives with: lives with their family and lives with their spouse Lives in: House/apartment Stairs: Yes: Internal: 12 steps; can reach both Has following equipment at home: Walker - 4 wheeled, Shower bench, and bed side commode  OCCUPATION: retired  PLOF: Independent and was using a SPC, she was doing some cooking and cleaning  PATIENT GOALS: have good ROM, less pain, walk better, go up and down stairs  NEXT MD VISIT: 11/30/22  OBJECTIVE:   DIAGNOSTIC FINDINGS: s/p left TKA  PATIENT SURVEYS:  FOTO 6.5  COGNITION: Overall cognitive status: Within functional limits for tasks assessed     SENSATION: [facility]  Numbness laterally  EDEMA:  Circumferential: right mid patella 46 cm, left 55 cm  PALPATION: Very swollen in the knee, the shin and the foot, very tender to palpation in the thigh, knee and shin, very sensitive where the tourniquet was  LOWER EXTREMITY ROM:  Active ROM Left AROM eval Left PROM eval Left AROM  Sitting 11/17/22 L knee AROM 12/08/22  Hip flexion      Hip extension      Hip abduction      Hip adduction      Hip internal rotation      Hip external rotation      Knee flexion 80 85 120 125  Knee extension 27 20 5 8   Ankle dorsiflexion      Ankle plantarflexion      Ankle inversion      Ankle eversion       (Blank rows = not tested)  LOWER EXTREMITY MMT:  MMT Right eval Left eval  Hip flexion    Hip extension    Hip abduction    Hip adduction    Hip internal rotation    Hip external rotation    Knee flexion  3-  Knee extension  2   Ankle dorsiflexion    Ankle plantarflexion    Ankle inversion    Ankle eversion     (Blank rows = not tested) FUNCTIONAL TESTS:  Timed up and go (TUG): 51 seconds  GAIT: Distance walked: 50 feet Assistive device utilized: Walker - 2 wheeled Level of assistance: SBA Comments: slow and guarded, small steps   TODAY'S TREATMENT:                                                                                                                              DATE:   12/15/22 Nustep level 5 x 6 minutes Leg curls 25# 2x10, left only 15# 2x10 Leg extension 10# 2x10, left only 5# 2x10 with ankle weight, could not do machine Gait outside with walker CGA for curbs 1/2 of parking [employer] 2.5# marches and hip abduction Leg press 20# 2x10 Bridges Vaso left knee medium pressure   12/13/22 NuStep L only L 5 x 6 min Leg press 20lb 2x10 HS curls 35lb 2x10, LLE 15lb x10 Leg Ext 10lb 2x10, LLE 5lb eccentric x10 S2S holding red ball 2x0 with progressing lowering Alt 2in box taps HHA x 1 2x5 each Gati 13ft w/ [facility]   12/08/22 NuStep L only L 5 x 6 min Checked Goals  ROM  TUG  FOTO  Stairs 4in two rails, cues for alternating pattern,12 steps,  Gait w/ SPC 40ft x 2 In RW hip abd & marching 2lb cuff 2x10  2lb cuff alt 4in box taps 2x10 LLE LAQ 2lb 2x10 Vaso 10 mins high pressure 36 degrees F in elevation  12/06/22 L knee PROM flexion and extensions NuStep L only L 5 x 6 min S2S holding red ball x10 then x5  LAQ LLE 3lb 2x10 HS curls green LLE 2x12 Step up 4in box in RW x10 then 6in box x10 Vaso 10 mins high pressure 36 degrees F in elevation    11/24/22 Nustep level 4 x 7 minutes Gentle STM to bilateral knees where she fell PAROM to bilateral knees Red tband HS curls both 3# LAQ with cues for TKE Ball b/n knees squeeze Red tband clamshells Gait with SPC x 200 feet CGA Gait with SPC and CGA outs the back door, negotiated 3 curbs up and down Vaso 10 mins high pressure 36 degrees F in  elevation  11/17/22 AROM sitting 5-120 STW to left knee and quad for swelling and pain relief TKE with manual resistance 2 sets 10 LAQ with PTA keeping thigh down to not use hip flexor 2 sets 10. Hold 3 sec TKE Nustep L 3 LE only 4 inch LLE step up 10 x fwd and latrally- CG A with walker HS curl  20 # 10 x BIL. 15# LLE only Knee ext 2 sets 5 up with 2 eccentric LLE lowers Amb with SPC CG-minA 100 feet,BIL hip weakness and positive trendelumberg VASO after for swelling with again educ on need ot elevate throughout the day for swelling   11/15/22 Nustep level 4 x 6 minutes, Then Level 3 LE only x3 min PROM of the left knee, gentle STM and scar massage and  STM L quad LAQ LLE 2x10 Hamstring curls green 2x10 Steps 4in alt pattern 2 rails 15 steps  Fitter press 2 blue 2x15 LLE  Vaso High pressure in elevation 36 degrees F  11/10/22 Nustep level 4 x 6 minutes HS curls green LLE 2x12 LAQ 2x10 LLE  PROM of the left knee, gentle STM and scar massage left knee S2S HHA x2 2x10 SAQ 2lb 2x10 Vaso High pressure in elevation 36 degrees F  PATIENT EDUCATION:  Education details: HEP/POC Person educated: Patient Education method: Programmer, multimedia, Facilities manager, Verbal cues, and Handouts Education comprehension: verbalized understanding  HOME EXERCISE PROGRAM: Access Code: [email or web address] URL: [email or web address] Date: 10/27/2022 Prepared by: Stacie Glaze  Exercises - Seated March  - 2 x daily - 7 x weekly - 1 sets - 10 reps - 3 hold - Seated Long Arc Quad  - 2 x daily - 7 x weekly - 1 sets - 10 reps - 3 hold - Seated Heel Raise  - 2 x daily - 7 x weekly - 1 sets - 10 reps - 3 hold - Seated Knee Flexion Stretch  - 1 x daily - 7 x weekly - 1 sets - 10 reps - 15 hold  ASSESSMENT:  CLINICAL IMPRESSION: Patient reports that she is weak in her legs, reports some pain in the right hip, she was very weak in the right quad.  We walked outside and she at times is very unsafe as  she is a little impulsive,  OBJECTIVE IMPAIRMENTS: Abnormal gait, cardiopulmonary status limiting activity, decreased activity tolerance, decreased balance, decreased endurance, decreased mobility, difficulty walking, decreased ROM, decreased strength, increased edema, impaired flexibility, improper body mechanics, and pain.   REHAB POTENTIAL: Good  CLINICAL DECISION MAKING: Stable/uncomplicated  EVALUATION COMPLEXITY: Low   GOALS: Goals reviewed with patient? Yes  SHORT TERM GOALS: Target date: 11/11/22 Independent with initial HEP Goal status: met 11/08/22  LONG TERM GOALS: Target date: 01/27/23  Independent with advanced HEP Goal status: INITIAL  2.  Decrease pain 50% Goal status:progressing 12/15/22  3.  Increase AROM to 10-110 degrees flexion Goal status: 11/17/22  MET 5-120  4.  Go up and down stairs safely step over step with hand rails Goal status: Progressing 12/08/22  5.  Decrease swelling by 4cm Goal status: INITIAL  6.  Decrease TUG to 24 seconds Goal status: Met  17.57 12/08/22   PLAN:  PT FREQUENCY: 1-2x/week  PT DURATION: 12 weeks  PLANNED INTERVENTIONS: Therapeutic exercises, Therapeutic activity, Neuromuscular re-education, Balance training, Gait training, Patient/Family education, Self Care, Joint mobilization, Joint manipulation, Stair training, Electrical stimulation, Vasopneumatic device, and Manual therapy  PLAN FOR NEXT SESSION:  strength QUAD and hips, ROM for TKE. function and gait   Jearld Lesch, PT

## 2022-12-20 ENCOUNTER — Ambulatory Visit: Payer: Medicare Other | Admitting: Physical Therapy

## 2022-12-20 ENCOUNTER — Encounter: Payer: Self-pay | Admitting: Physical Therapy

## 2022-12-20 DIAGNOSIS — M25562 Pain in left knee: Secondary | ICD-10-CM | POA: Diagnosis not present

## 2022-12-20 DIAGNOSIS — M25662 Stiffness of left knee, not elsewhere classified: Secondary | ICD-10-CM

## 2022-12-20 DIAGNOSIS — R600 Localized edema: Secondary | ICD-10-CM

## 2022-12-20 DIAGNOSIS — M6281 Muscle weakness (generalized): Secondary | ICD-10-CM

## 2022-12-20 DIAGNOSIS — R262 Difficulty in walking, not elsewhere classified: Secondary | ICD-10-CM

## 2022-12-20 NOTE — Therapy (Signed)
OUTPATIENT PHYSICAL THERAPY LOWER EXTREMITY TREATMENT    Patient Name: KAMINI NIST MRN: [id number] DOB:03/07/1933, 89 y.o., female Today's Date: 12/20/2022  END OF SESSION:  PT End of Session - 12/20/22 1622     Visit Number 13    Date for PT Re-Evaluation 01/27/23    Authorization Type [employer] Medicare    PT Start Time 1613    PT Stop Time 1700    PT Time Calculation (min) 47 min    Activity Tolerance Patient tolerated treatment well    Behavior During Therapy WFL for tasks assessed/performed              Past Medical History:  Diagnosis Date   Acalculous cholecystitis 08/24/2018   Arthritis    DVT of lower extremity (deep venous thrombosis) (HCC)    right leg - after back surgery in 2019   GERD (gastroesophageal reflux disease)    History of blood transfusion    Hypothyroidism    PONV (postoperative nausea and vomiting)    Ptosis of both eyelids    Vertigo    Wears glasses    reading   Past Surgical History:  Procedure Laterality Date   ABDOMINAL HYSTERECTOMY  1986   APPLICATION OF ROBOTIC ASSISTANCE FOR SPINAL PROCEDURE N/A 07/17/2017   Procedure: APPLICATION OF ROBOTIC ASSISTANCE FOR SPINAL PROCEDURE;  Surgeon: Barnett Abu, MD;  Location: [facility] OR;  Service: Neurosurgery;  Laterality: N/A;   APPLICATION OF ROBOTIC ASSISTANCE FOR SPINAL PROCEDURE N/A 09/01/2017   Procedure: APPLICATION OF ROBOTIC ASSISTANCE FOR SPINAL PROCEDURE;  Surgeon: Barnett Abu, MD;  Location: [facility] OR;  Service: Neurosurgery;  Laterality: N/A;   APPLICATION OF ROBOTIC ASSISTANCE FOR SPINAL PROCEDURE N/A 10/27/2020   Procedure: APPLICATION OF ROBOTIC ASSISTANCE FOR SPINAL PROCEDURE;  Surgeon: Barnett Abu, MD;  Location: [facility] OR;  Service: Neurosurgery;  Laterality: N/A;   BACK SURGERY  1986   lumb lam   BREAST BIOPSY Right 2011   CARPAL TUNNEL RELEASE  2000   right   CARPAL TUNNEL RELEASE  2012   left   CHOLECYSTECTOMY N/A 08/24/2018   Procedure: LAPAROSCOPIC CHOLECYSTECTOMY WITH  INTRAOPERATIVE CHOLANGIOGRAM;  Surgeon: Claud Kelp, MD;  Location: [facility] OR;  Service: General;  Laterality: N/A;   COLONOSCOPY     CYSTOCELE REPAIR  2007   sling   DISTAL INTERPHALANGEAL JOINT FUSION Right 02/04/2014   Procedure: FUSION DISTAL INTERPHALANGEAL JOINT RIGHT INDEX FINGER ;  Surgeon: Cindee Salt, MD;  Location: [facility];  Service: Orthopedics;  Laterality: Right;   EYE SURGERY Bilateral    Cataract surgery with lens implant   KNEE SURGERY  2009,2011   partial knee   LAPAROSCOPY N/A 08/24/2018   Procedure: LAPAROSCOPY DIAGNOSTIC;  Surgeon: Claud Kelp, MD;  Location: [facility] OR;  Service: General;  Laterality: N/A;   LUMBAR PERCUTANEOUS PEDICLE SCREW 4 LEVEL N/A 10/27/2020   Procedure: Thoracic nine-ten Laminectomy with extension of fusion from Thoracic ten to Thoracic six with segmental fixation (cemented) with pedicle screw and mazor;  Surgeon: Barnett Abu, MD;  Location: [facility] OR;  Service: Neurosurgery;  Laterality: N/A;   OPEN REDUCTION INTERNAL FIXATION (ORIF) PROXIMAL PHALANX Left 08/30/2013   Procedure: OPEN REDUCTION INTERNAL FIXATION (ORIF) PROXIMAL PHALANX FRACTURE LEFT SMALL FINGER; SPLINT RING FINGER;  Surgeon: Nicki Reaper, MD;  Location: [facility];  Service: Orthopedics;  Laterality: Left;   POSTERIOR LUMBAR FUSION 4 LEVEL N/A 07/17/2017   Procedure: Thoracic Ten - Lumbar Five revison of hardware with Mazor;  Surgeon: Danielle Dess,  Sherilyn Cooter, MD;  Location: [facility] OR;  Service: Neurosurgery;  Laterality: N/A;  Thoracic/Lumbar   PTOSIS REPAIR Bilateral 07/27/2015   Procedure: PTOSIS REPAIR;  Surgeon: Louisa Second, MD;  Location: [facility];  Service: Plastics;  Laterality: Bilateral;   SHOULDER SURGERY     rt rcr,and lt   TENOLYSIS Left 05/21/2021   Procedure: RELEASE EXTENSOR POLLICIS LONGUS LEFT;  Surgeon: Cindee Salt, MD;  Location: [facility];  Service: Orthopedics;  Laterality: Left;   TENOLYSIS Left 06/08/2021    Procedure: RELEASE EXTENSOR POLLICIS LONGUS LEFT;  Surgeon: Cindee Salt, MD;  Location: [facility];  Service: Orthopedics;  Laterality: Left;   TONSILLECTOMY     TOTAL KNEE ARTHROPLASTY Right 12/23/2019   Procedure: TOTAL KNEE ARTHROPLASTY;  Surgeon: Dannielle Huh, MD;  Location: [facility];  Service: Orthopedics;  Laterality: Right;   TRIGGER FINGER RELEASE Left 05/21/2021   Procedure: RELEASE TRIGGER FINGER/A-1 PULLEY LEFT MIDDLE FINGER;  Surgeon: Cindee Salt, MD;  Location: [facility];  Service: Orthopedics;  Laterality: Left;   TRIGGER FINGER RELEASE Left 06/08/2021   Procedure: RELEASE A-1 PULLEY LEFT MIDDLE FINGER;  Surgeon: Cindee Salt, MD;  Location: [facility];  Service: Orthopedics;  Laterality: Left;   Patient Active Problem List   Diagnosis Date Noted   Myelopathy concurrent with and due to spinal stenosis of thoracic region (HCC) 10/27/2020   S/P total knee arthroplasty, right 12/23/2019   Acalculous cholecystitis 08/24/2018   Osteoarthritis of right knee    Vertigo    DDD (degenerative disc disease), cervical    Benign essential HTN    History of DVT (deep vein thrombosis)    Tachycardia    Steroid-induced hyperglycemia    Hypothyroidism    Neuropathic pain    Acute blood loss anemia    S/P lumbar fusion    Closed L5 vertebral fracture (HCC) 08/31/2017   Closed fracture of fifth lumbar vertebra (HCC) 08/30/2017   Lumbar vertebral fracture (HCC) 07/17/2017   Scoliosis 06/13/2017    PCP: Burton Apley, MD  REFERRING PROVIDER: Sherlean Foot, MD  REFERRING DIAG: S/P left TKA  THERAPY DIAG:  Acute pain of left knee  Localized edema  Difficulty in walking, not elsewhere classified  Stiffness of left knee, not elsewhere classified  Muscle weakness (generalized)  Rationale for Evaluation and Treatment: Rehabilitation  ONSET DATE: 10/17/22  SUBJECTIVE:   SUBJECTIVE STATEMENT: Still just weak, need to get stronger, still  driving some PERTINENT HISTORY: See above PAIN:  Are you having pain? Yes: NPRS scale: 3/10 Pain location: left knee Pain description: ache, deep Aggravating factors: night, bending, being up on the leg pain a 10/10 Relieving factors: sit down, elevate, pain medication, ice pain can be 0/10  PRECAUTIONS: None  RED FLAGS: None   WEIGHT BEARING RESTRICTIONS: No  FALLS:  Has patient fallen in last 6 months? No  LIVING ENVIRONMENT: Lives with: lives with their family and lives with their spouse Lives in: House/apartment Stairs: Yes: Internal: 12 steps; can reach both Has following equipment at home: Walker - 4 wheeled, Shower bench, and bed side commode  OCCUPATION: retired  PLOF: Independent and was using a SPC, she was doing some cooking and cleaning  PATIENT GOALS: have good ROM, less pain, walk better, go up and down stairs  NEXT MD VISIT: 11/30/22  OBJECTIVE:   DIAGNOSTIC FINDINGS: s/p left TKA  PATIENT SURVEYS:  FOTO 6.5  COGNITION: Overall cognitive status: Within functional limits for tasks assessed  SENSATION: WFL Numbness laterally  EDEMA:  Circumferential: right mid patella 46 cm, left 55 cm  PALPATION: Very swollen in the knee, the shin and the foot, very tender to palpation in the thigh, knee and shin, very sensitive where the tourniquet was  LOWER EXTREMITY ROM:  Active ROM Left AROM eval Left PROM eval Left AROM  Sitting 11/17/22 L knee AROM 12/08/22  Hip flexion      Hip extension      Hip abduction      Hip adduction      Hip internal rotation      Hip external rotation      Knee flexion 80 85 120 125  Knee extension 27 20 5 8   Ankle dorsiflexion      Ankle plantarflexion      Ankle inversion      Ankle eversion       (Blank rows = not tested)  LOWER EXTREMITY MMT:  MMT Right eval Left eval  Hip flexion    Hip extension    Hip abduction    Hip adduction    Hip internal rotation    Hip external rotation    Knee flexion   3-  Knee extension  2  Ankle dorsiflexion    Ankle plantarflexion    Ankle inversion    Ankle eversion     (Blank rows = not tested) FUNCTIONAL TESTS:  Timed up and go (TUG): 51 seconds  GAIT: Distance walked: 50 feet Assistive device utilized: Walker - 2 wheeled Level of assistance: SBA Comments: slow and guarded, small steps   TODAY'S TREATMENT:                                                                                                                              DATE:   12/20/22 Nustep level 5 x 7 minutes 25# HS curls 3x10 5# leg extension 2x10 Gait with SPC and CGA x 220 feet Red tband ankle exercises LEg press 20# 3x10 Standing hip abduction, marching and extension 3# On airex balance beam side stepping with use of the Pbars Gait with SPC x 200 feet CGA  12/15/22 Nustep level 5 x 6 minutes Leg curls 25# 2x10, left only 15# 2x10 Leg extension 10# 2x10, left only 5# 2x10 with ankle weight, could not do machine Gait outside with walker CGA for curbs 1/2 of parking [employer] 2.5# marches and hip abduction Leg press 20# 2x10 Bridges Vaso left knee medium pressure   12/13/22 NuStep L only L 5 x 6 min Leg press 20lb 2x10 HS curls 35lb 2x10, LLE 15lb x10 Leg Ext 10lb 2x10, LLE 5lb eccentric x10 S2S holding red ball 2x0 with progressing lowering Alt 2in box taps HHA x 1 2x5 each Gati 39ft w/ [facility]   12/08/22 NuStep L only L 5 x 6 min Checked Goals  ROM  TUG  FOTO  Stairs 4in two rails, cues for alternating pattern,12 steps,  Gait w/  SPC 48ft x 2 In RW hip abd & marching 2lb cuff 2x10  2lb cuff alt 4in box taps 2x10 LLE LAQ 2lb 2x10 Vaso 10 mins high pressure 36 degrees F in elevation  12/06/22 L knee PROM flexion and extensions NuStep L only L 5 x 6 min S2S holding red ball x10 then x5  LAQ LLE 3lb 2x10 HS curls green LLE 2x12 Step up 4in box in RW x10 then 6in box x10 Vaso 10 mins high pressure 36 degrees F in elevation    11/24/22 Nustep level 4 x 7  minutes Gentle STM to bilateral knees where she fell PAROM to bilateral knees Red tband HS curls both 3# LAQ with cues for TKE Ball b/n knees squeeze Red tband clamshells Gait with SPC x 200 feet CGA Gait with SPC and CGA outs the back door, negotiated 3 curbs up and down Vaso 10 mins high pressure 36 degrees F in elevation  11/17/22 AROM sitting 5-120 STW to left knee and quad for swelling and pain relief TKE with manual resistance 2 sets 10 LAQ with PTA keeping thigh down to not use hip flexor 2 sets 10. Hold 3 sec TKE Nustep L 3 LE only 4 inch LLE step up 10 x fwd and latrally- CG A with walker HS curl  20 # 10 x BIL. 15# LLE only Knee ext 2 sets 5 up with 2 eccentric LLE lowers Amb with SPC CG-minA 100 feet,BIL hip weakness and positive trendelumberg VASO after for swelling with again educ on need ot elevate throughout the day for swelling   11/15/22 Nustep level 4 x 6 minutes, Then Level 3 LE only x3 min PROM of the left knee, gentle STM and scar massage and  STM L quad LAQ LLE 2x10 Hamstring curls green 2x10 Steps 4in alt pattern 2 rails 15 steps  Fitter press 2 blue 2x15 LLE  Vaso High pressure in elevation 36 degrees F  11/10/22 Nustep level 4 x 6 minutes HS curls green LLE 2x12 LAQ 2x10 LLE  PROM of the left knee, gentle STM and scar massage left knee S2S HHA x2 2x10 SAQ 2lb 2x10 Vaso High pressure in elevation 36 degrees F  PATIENT EDUCATION:  Education details: HEP/POC Person educated: Patient Education method: Programmer, multimedia, Facilities manager, Verbal cues, and Handouts Education comprehension: verbalized understanding  HOME EXERCISE PROGRAM: Access Code: [email or web address] URL: [email or web address] Date: 10/27/2022 Prepared by: Stacie Glaze  Exercises - Seated March  - 2 x daily - 7 x weekly - 1 sets - 10 reps - 3 hold - Seated Long Arc Quad  - 2 x daily - 7 x weekly - 1 sets - 10 reps - 3 hold - Seated Heel Raise  - 2 x daily - 7 x weekly - 1  sets - 10 reps - 3 hold - Seated Knee Flexion Stretch  - 1 x daily - 7 x weekly - 1 sets - 10 reps - 15 hold  ASSESSMENT:  CLINICAL IMPRESSION: Patient with very weak right ankle right hip area's with walking with the [facility] with any distraction she seems to veer off and lose balance, she will usually grab the wall or furniture to help with balance  OBJECTIVE IMPAIRMENTS: Abnormal gait, cardiopulmonary status limiting activity, decreased activity tolerance, decreased balance, decreased endurance, decreased mobility, difficulty walking, decreased ROM, decreased strength, increased edema, impaired flexibility, improper body mechanics, and pain.   REHAB POTENTIAL: Good  CLINICAL DECISION MAKING: Stable/uncomplicated  EVALUATION COMPLEXITY: Low   GOALS: Goals  reviewed with patient? Yes  SHORT TERM GOALS: Target date: 11/11/22 Independent with initial HEP Goal status: met 11/08/22  LONG TERM GOALS: Target date: 01/27/23  Independent with advanced HEP Goal status: INITIAL  2.  Decrease pain 50% Goal status:progressing 12/15/22  3.  Increase AROM to 10-110 degrees flexion Goal status: 11/17/22 MET 5-120  4.  Go up and down stairs safely step over step with hand rails Goal status: Progressing 12/08/22  5.  Decrease swelling by 4cm Goal status: progressing 12/20/22  6.  Decrease TUG to 24 seconds Goal status: Met  17.57 12/08/22   PLAN:  PT FREQUENCY: 1-2x/week  PT DURATION: 12 weeks  PLANNED INTERVENTIONS: Therapeutic exercises, Therapeutic activity, Neuromuscular re-education, Balance training, Gait training, Patient/Family education, Self Care, Joint mobilization, Joint manipulation, Stair training, Electrical stimulation, Vasopneumatic device, and Manual therapy  PLAN FOR NEXT SESSION:  strength QUAD and hips, ROM for TKE. function and gait   Jearld Lesch, PT

## 2022-12-22 ENCOUNTER — Ambulatory Visit: Payer: Medicare Other | Admitting: Physical Therapy

## 2022-12-22 ENCOUNTER — Encounter: Payer: Self-pay | Admitting: Physical Therapy

## 2022-12-22 DIAGNOSIS — M25562 Pain in left knee: Secondary | ICD-10-CM

## 2022-12-22 DIAGNOSIS — R262 Difficulty in walking, not elsewhere classified: Secondary | ICD-10-CM

## 2022-12-22 DIAGNOSIS — M25662 Stiffness of left knee, not elsewhere classified: Secondary | ICD-10-CM

## 2022-12-22 DIAGNOSIS — M6281 Muscle weakness (generalized): Secondary | ICD-10-CM

## 2022-12-22 DIAGNOSIS — R600 Localized edema: Secondary | ICD-10-CM

## 2022-12-22 NOTE — Therapy (Signed)
OUTPATIENT PHYSICAL THERAPY LOWER EXTREMITY TREATMENT    Patient Name: [name] MRN: [id number] DOB:[birth year], 89 y.o., female Today's Date: 12/22/2022  END OF SESSION:  PT End of Session - 12/22/22 1605     Visit Number 14    Date for PT Re-Evaluation 01/27/23    Authorization Type [employer] Medicare    PT Start Time 1606    PT Stop Time 1657    PT Time Calculation (min) 51 min    Activity Tolerance Patient tolerated treatment well    Behavior During Therapy WFL for tasks assessed/performed              Past Medical History:  Diagnosis Date   Acalculous cholecystitis 08/24/2018   Arthritis    DVT of lower extremity (deep venous thrombosis) (HCC)    right leg - after back surgery in 2019   GERD (gastroesophageal reflux disease)    History of blood transfusion    Hypothyroidism    PONV (postoperative nausea and vomiting)    Ptosis of both eyelids    Vertigo    Wears glasses    reading   Past Surgical History:  Procedure Laterality Date   ABDOMINAL HYSTERECTOMY  1986   APPLICATION OF ROBOTIC ASSISTANCE FOR SPINAL PROCEDURE N/A 07/17/2017   Procedure: APPLICATION OF ROBOTIC ASSISTANCE FOR SPINAL PROCEDURE;  Surgeon: Barnett Abu, MD;  Location: [facility] OR;  Service: Neurosurgery;  Laterality: N/A;   APPLICATION OF ROBOTIC ASSISTANCE FOR SPINAL PROCEDURE N/A 09/01/2017   Procedure: APPLICATION OF ROBOTIC ASSISTANCE FOR SPINAL PROCEDURE;  Surgeon: Barnett Abu, MD;  Location: [facility] OR;  Service: Neurosurgery;  Laterality: N/A;   APPLICATION OF ROBOTIC ASSISTANCE FOR SPINAL PROCEDURE N/A 10/27/2020   Procedure: APPLICATION OF ROBOTIC ASSISTANCE FOR SPINAL PROCEDURE;  Surgeon: Barnett Abu, MD;  Location: [facility] OR;  Service: Neurosurgery;  Laterality: N/A;   BACK SURGERY  1986   lumb lam   BREAST BIOPSY Right 2011   CARPAL TUNNEL RELEASE  2000   right   CARPAL TUNNEL RELEASE  2012   left   CHOLECYSTECTOMY N/A 08/24/2018   Procedure: LAPAROSCOPIC CHOLECYSTECTOMY WITH  INTRAOPERATIVE CHOLANGIOGRAM;  Surgeon: Claud Kelp, MD;  Location: [facility] OR;  Service: General;  Laterality: N/A;   COLONOSCOPY     CYSTOCELE REPAIR  2007   sling   DISTAL INTERPHALANGEAL JOINT FUSION Right 02/04/2014   Procedure: FUSION DISTAL INTERPHALANGEAL JOINT RIGHT INDEX FINGER ;  Surgeon: Cindee Salt, MD;  Location: [facility];  Service: Orthopedics;  Laterality: Right;   EYE SURGERY Bilateral    Cataract surgery with lens implant   KNEE SURGERY  2009,2011   partial knee   LAPAROSCOPY N/A 08/24/2018   Procedure: LAPAROSCOPY DIAGNOSTIC;  Surgeon: Claud Kelp, MD;  Location: [facility] OR;  Service: General;  Laterality: N/A;   LUMBAR PERCUTANEOUS PEDICLE SCREW 4 LEVEL N/A 10/27/2020   Procedure: Thoracic nine-ten Laminectomy with extension of fusion from Thoracic ten to Thoracic six with segmental fixation (cemented) with pedicle screw and mazor;  Surgeon: Barnett Abu, MD;  Location[facility] OR;  Service: Neurosurgery;  Laterality: N/A;   OPEN REDUCTION INTERNAL FIXATION (ORIF) PROXIMAL PHALANX Left 08/30/2013   Procedure: OPEN REDUCTION INTERNAL FIXATION (ORIF) PROXIMAL PHALANX FRACTURE LEFT SMALL FINGER; SPLINT RING FINGER;  Surgeon: Nicki Reaper, MD;  Location: [facility];  Service: Orthopedics;  Laterality: Left;   POSTERIOR LUMBAR FUSION 4 LEVEL N/A 07/17/2017   Procedure: Thoracic Ten - Lumbar Five revison of hardware with Mazor;  Surgeon: Danielle Dess,  Sherilyn Cooter, MD;  Location: [facility] OR;  Service: Neurosurgery;  Laterality: N/A;  Thoracic/Lumbar   PTOSIS REPAIR Bilateral 07/27/2015   Procedure: PTOSIS REPAIR;  Surgeon: Louisa Second, MD;  Location: [facility];  Service: Plastics;  Laterality: Bilateral;   SHOULDER SURGERY     rt rcr,and lt   TENOLYSIS Left 05/21/2021   Procedure: RELEASE EXTENSOR POLLICIS LONGUS LEFT;  Surgeon: Cindee Salt, MD;  Location: [facility];  Service: Orthopedics;  Laterality: Left;   TENOLYSIS Left 06/08/2021    Procedure: RELEASE EXTENSOR POLLICIS LONGUS LEFT;  Surgeon: Cindee Salt, MD;  Location: [facility];  Service: Orthopedics;  Laterality: Left;   TONSILLECTOMY     TOTAL KNEE ARTHROPLASTY Right 12/23/2019   Procedure: TOTAL KNEE ARTHROPLASTY;  Surgeon: Dannielle Huh, MD;  Location: [facility];  Service: Orthopedics;  Laterality: Right;   TRIGGER FINGER RELEASE Left 05/21/2021   Procedure: RELEASE TRIGGER FINGER/A-1 PULLEY LEFT MIDDLE FINGER;  Surgeon: Cindee Salt, MD;  Location: [facility];  Service: Orthopedics;  Laterality: Left;   TRIGGER FINGER RELEASE Left 06/08/2021   Procedure: RELEASE A-1 PULLEY LEFT MIDDLE FINGER;  Surgeon: Cindee Salt, MD;  Location: [facility];  Service: Orthopedics;  Laterality: Left;   Patient Active Problem List   Diagnosis Date Noted   Myelopathy concurrent with and due to spinal stenosis of thoracic region (HCC) 10/27/2020   S/P total knee arthroplasty, right 12/23/2019   Acalculous cholecystitis 08/24/2018   Osteoarthritis of right knee    Vertigo    DDD (degenerative disc disease), cervical    Benign essential HTN    History of DVT (deep vein thrombosis)    Tachycardia    Steroid-induced hyperglycemia    Hypothyroidism    Neuropathic pain    Acute blood loss anemia    S/P lumbar fusion    Closed L5 vertebral fracture (HCC) 08/31/2017   Closed fracture of fifth lumbar vertebra (HCC) 08/30/2017   Lumbar vertebral fracture (HCC) 07/17/2017   Scoliosis 06/13/2017    PCP: Burton Apley, MD  REFERRING PROVIDER: Sherlean Foot, MD  REFERRING DIAG: S/P left TKA  THERAPY DIAG:  Acute pain of left knee  Localized edema  Difficulty in walking, not elsewhere classified  Stiffness of left knee, not elsewhere classified  Muscle weakness (generalized)  Rationale for Evaluation and Treatment: Rehabilitation  ONSET DATE: 10/17/22  SUBJECTIVE:   SUBJECTIVE STATEMENT: Patient reports that she was really sore  yesterday, mostly in the hips  PERTINENT HISTORY: See above PAIN:  Are you having pain? Yes: NPRS scale: 4/10 Pain location: left knee Pain description: ache, deep Aggravating factors: night, bending, being up on the leg pain a 10/10 Relieving factors: sit down, elevate, pain medication, ice pain can be 0/10  PRECAUTIONS: None  RED FLAGS: None   WEIGHT BEARING RESTRICTIONS: No  FALLS:  Has patient fallen in last 6 months? No  LIVING ENVIRONMENT: Lives with: lives with their family and lives with their spouse Lives in: House/apartment Stairs: Yes: Internal: 12 steps; can reach both Has following equipment at home: Walker - 4 wheeled, Shower bench, and bed side commode  OCCUPATION: retired  PLOF: Independent and was using a SPC, she was doing some cooking and cleaning  PATIENT GOALS: have good ROM, less pain, walk better, go up and down stairs  NEXT MD VISIT: 11/30/22  OBJECTIVE:   DIAGNOSTIC FINDINGS: s/p left TKA  PATIENT SURVEYS:  FOTO 6.5  COGNITION: Overall cognitive status: Within functional limits for tasks  assessed     SENSATION: WFL Numbness laterally  EDEMA:  Circumferential: right mid patella 46 cm, left 55 cm  PALPATION: Very swollen in the knee, the shin and the foot, very tender to palpation in the thigh, knee and shin, very sensitive where the tourniquet was  LOWER EXTREMITY ROM:  Active ROM Left AROM eval Left PROM eval Left AROM  Sitting 11/17/22 L knee AROM 12/08/22  Hip flexion      Hip extension      Hip abduction      Hip adduction      Hip internal rotation      Hip external rotation      Knee flexion 80 85 120 125  Knee extension 27 20 5 8   Ankle dorsiflexion      Ankle plantarflexion      Ankle inversion      Ankle eversion       (Blank rows = not tested)  LOWER EXTREMITY MMT:  MMT Right eval Left eval  Hip flexion    Hip extension    Hip abduction    Hip adduction    Hip internal rotation    Hip external rotation     Knee flexion  3-  Knee extension  2  Ankle dorsiflexion    Ankle plantarflexion    Ankle inversion    Ankle eversion     (Blank rows = not tested) FUNCTIONAL TESTS:  Timed up and go (TUG): 51 seconds  GAIT: Distance walked: 50 feet Assistive device utilized: Walker - 2 wheeled Level of assistance: SBA Comments: slow and guarded, small steps   TODAY'S TREATMENT:                                                                                                                              DATE:   12/22/22 Nustep level 5 x 6 mintues 25# HS curls 3x10 5# leg extension 2x10 Supine feet on ball K2C, rotation, small bridge, isometric abs Ball b/n knees squeeze Green tband clamshells Gait with SPC 2x200 feet SBA at times uses the walls Vaso Medium pressure in elevation left knee  12/20/22 Nustep level 5 x 7 minutes 25# HS curls 3x10 5# leg extension 2x10 Gait with SPC and CGA x 220 feet Red tband ankle exercises LEg press 20# 3x10 Standing hip abduction, marching and extension 3# On airex balance beam side stepping with use of the Pbars Gait with SPC x 200 feet CGA  12/15/22 Nustep level 5 x 6 minutes Leg curls 25# 2x10, left only 15# 2x10 Leg extension 10# 2x10, left only 5# 2x10 with ankle weight, could not do machine Gait outside with walker CGA for curbs 1/2 of parking [employer] 2.5# marches and hip abduction Leg press 20# 2x10 Bridges Vaso left knee medium pressure   12/13/22 NuStep L only L 5 x 6 min Leg press 20lb 2x10 HS curls 35lb 2x10, LLE 15lb x10 Leg Ext 10lb 2x10, LLE  5lb eccentric x10 S2S holding red ball 2x0 with progressing lowering Alt 2in box taps HHA x 1 2x5 each Gati 10ft w/ [facility]   12/08/22 NuStep L only L 5 x 6 min Checked Goals  ROM  TUG  FOTO  Stairs 4in two rails, cues for alternating pattern,12 steps,  Gait w/ SPC 23ft x 2 In RW hip abd & marching 2lb cuff 2x10  2lb cuff alt 4in box taps 2x10 LLE LAQ 2lb 2x10 Vaso 10 mins high pressure 36  degrees F in elevation  12/06/22 L knee PROM flexion and extensions NuStep L only L 5 x 6 min S2S holding red ball x10 then x5  LAQ LLE 3lb 2x10 HS curls green LLE 2x12 Step up 4in box in RW x10 then 6in box x10 Vaso 10 mins high pressure 36 degrees F in elevation    11/24/22 Nustep level 4 x 7 minutes Gentle STM to bilateral knees where she fell PAROM to bilateral knees Red tband HS curls both 3# LAQ with cues for TKE Ball b/n knees squeeze Red tband clamshells Gait with SPC x 200 feet CGA Gait with SPC and CGA outs the back door, negotiated 3 curbs up and down Vaso 10 mins high pressure 36 degrees F in elevation  11/17/22 AROM sitting 5-120 STW to left knee and quad for swelling and pain relief TKE with manual resistance 2 sets 10 LAQ with PTA keeping thigh down to not use hip flexor 2 sets 10. Hold 3 sec TKE Nustep L 3 LE only 4 inch LLE step up 10 x fwd and latrally- CG A with walker HS curl  20 # 10 x BIL. 15# LLE only Knee ext 2 sets 5 up with 2 eccentric LLE lowers Amb with SPC CG-minA 100 feet,BIL hip weakness and positive trendelumberg VASO after for swelling with again educ on need ot elevate throughout the day for swelling  PATIENT EDUCATION:  Education details: HEP/POC Person educated: Patient Education method: Programmer, multimedia, Facilities manager, Verbal cues, and Handouts Education comprehension: verbalized understanding  HOME EXERCISE PROGRAM: Access Code: [email or web address] URL: [email or web address] Date: 10/27/2022 Prepared by: Stacie Glaze  Exercises - Seated March  - 2 x daily - 7 x weekly - 1 sets - 10 reps - 3 hold - Seated Long Arc Quad  - 2 x daily - 7 x weekly - 1 sets - 10 reps - 3 hold - Seated Heel Raise  - 2 x daily - 7 x weekly - 1 sets - 10 reps - 3 hold - Seated Knee Flexion Stretch  - 1 x daily - 7 x weekly - 1 sets - 10 reps - 15 hold  ASSESSMENT:  CLINICAL IMPRESSION: Patient comes in and reports that her hips have been very  sore and tired and weak since the last visit she felt like it may be the leg press we did.  I backed off on some exercises, to see if we can help the hips.  Still has difficulty with her balance when using a SPC,   OBJECTIVE IMPAIRMENTS: Abnormal gait, cardiopulmonary status limiting activity, decreased activity tolerance, decreased balance, decreased endurance, decreased mobility, difficulty walking, decreased ROM, decreased strength, increased edema, impaired flexibility, improper body mechanics, and pain.   REHAB POTENTIAL: Good  CLINICAL DECISION MAKING: Stable/uncomplicated  EVALUATION COMPLEXITY: Low   GOALS: Goals reviewed with patient? Yes  SHORT TERM GOALS: Target date: 11/11/22 Independent with initial HEP Goal status: met 11/08/22  LONG TERM GOALS: Target date: 01/27/23  Independent with  advanced HEP Goal status: INITIAL  2.  Decrease pain 50% Goal status:progressing 12/15/22  3.  Increase AROM to 10-110 degrees flexion Goal status: 11/17/22 MET 5-120  4.  Go up and down stairs safely step over step with hand rails Goal status: Progressing 12/08/22  5.  Decrease swelling by 4cm Goal status: progressing 12/20/22  6.  Decrease TUG to 24 seconds Goal status: Met  17.57 12/08/22   PLAN:  PT FREQUENCY: 1-2x/week  PT DURATION: 12 weeks  PLANNED INTERVENTIONS: Therapeutic exercises, Therapeutic activity, Neuromuscular re-education, Balance training, Gait training, Patient/Family education, Self Care, Joint mobilization, Joint manipulation, Stair training, Electrical stimulation, Vasopneumatic device, and Manual therapy  PLAN FOR NEXT SESSION:  strength QUAD and hips, ROM for TKE. function and gait   Jearld Lesch, PT

## 2022-12-27 ENCOUNTER — Encounter: Payer: Self-pay | Admitting: Physical Therapy

## 2022-12-27 ENCOUNTER — Ambulatory Visit: Payer: Medicare Other | Admitting: Physical Therapy

## 2022-12-27 DIAGNOSIS — M25562 Pain in left knee: Secondary | ICD-10-CM

## 2022-12-27 DIAGNOSIS — M6281 Muscle weakness (generalized): Secondary | ICD-10-CM

## 2022-12-27 DIAGNOSIS — R262 Difficulty in walking, not elsewhere classified: Secondary | ICD-10-CM

## 2022-12-27 DIAGNOSIS — R600 Localized edema: Secondary | ICD-10-CM

## 2022-12-27 DIAGNOSIS — M25662 Stiffness of left knee, not elsewhere classified: Secondary | ICD-10-CM

## 2022-12-27 NOTE — Therapy (Signed)
OUTPATIENT PHYSICAL THERAPY LOWER EXTREMITY TREATMENT    Patient Name: EPIMENIA LIBERA MRN: [id number] DOB:1932-10-19, 89 y.o., female Today's Date: 12/27/2022  END OF SESSION:  PT End of Session - 12/27/22 1612     Visit Number 15    Date for PT Re-Evaluation 01/27/23    Authorization Type [employer] Medicare    PT Start Time 1612    PT Stop Time 1712    PT Time Calculation (min) 60 min    Activity Tolerance Patient tolerated treatment well    Behavior During Therapy [facility] for tasks assessed/performed              Past Medical History:  Diagnosis Date   Acalculous cholecystitis 08/24/2018   Arthritis    DVT of lower extremity (deep venous thrombosis) (HCC)    right leg - after back surgery in 2019   GERD (gastroesophageal reflux disease)    History of blood transfusion    Hypothyroidism    PONV (postoperative nausea and vomiting)    Ptosis of both eyelids    Vertigo    Wears glasses    reading   Past Surgical History:  Procedure Laterality Date   ABDOMINAL HYSTERECTOMY  1986   APPLICATION OF ROBOTIC ASSISTANCE FOR SPINAL PROCEDURE N/A 07/17/2017   Procedure: APPLICATION OF ROBOTIC ASSISTANCE FOR SPINAL PROCEDURE;  Surgeon: Barnett Abu, MD;  Location: [facility] OR;  Service: Neurosurgery;  Laterality: N/A;   APPLICATION OF ROBOTIC ASSISTANCE FOR SPINAL PROCEDURE N/A 09/01/2017   Procedure: APPLICATION OF ROBOTIC ASSISTANCE FOR SPINAL PROCEDURE;  Surgeon: Barnett Abu, MD;  Location: [facility] OR;  Service: Neurosurgery;  Laterality: N/A;   APPLICATION OF ROBOTIC ASSISTANCE FOR SPINAL PROCEDURE N/A 10/27/2020   Procedure: APPLICATION OF ROBOTIC ASSISTANCE FOR SPINAL PROCEDURE;  Surgeon: Barnett Abu, MD;  Location: [facility] OR;  Service: Neurosurgery;  Laterality: N/A;   BACK SURGERY  1986   lumb lam   BREAST BIOPSY Right 2011   CARPAL TUNNEL RELEASE  2000   right   CARPAL TUNNEL RELEASE  2012   left   CHOLECYSTECTOMY N/A 08/24/2018   Procedure: LAPAROSCOPIC CHOLECYSTECTOMY WITH  INTRAOPERATIVE CHOLANGIOGRAM;  Surgeon: Claud Kelp, MD;  Location: [facility] OR;  Service: General;  Laterality: N/A;   COLONOSCOPY     CYSTOCELE REPAIR  2007   sling   DISTAL INTERPHALANGEAL JOINT FUSION Right 02/04/2014   Procedure: FUSION DISTAL INTERPHALANGEAL JOINT RIGHT INDEX FINGER ;  Surgeon: Cindee Salt, MD;  Location: [facility];  Service: Orthopedics;  Laterality: Right;   EYE SURGERY Bilateral    Cataract surgery with lens implant   KNEE SURGERY  2009,2011   partial knee   LAPAROSCOPY N/A 08/24/2018   Procedure: LAPAROSCOPY DIAGNOSTIC;  Surgeon: Claud Kelp, MD;  Location: [facility] OR;  Service: General;  Laterality: N/A;   LUMBAR PERCUTANEOUS PEDICLE SCREW 4 LEVEL N/A 10/27/2020   Procedure: Thoracic nine-ten Laminectomy with extension of fusion from Thoracic ten to Thoracic six with segmental fixation (cemented) with pedicle screw and mazor;  Surgeon: Barnett Abu, MD;  Location: [facility] OR;  Service: Neurosurgery;  Laterality: N/A;   OPEN REDUCTION INTERNAL FIXATION (ORIF) PROXIMAL PHALANX Left 08/30/2013   Procedure: OPEN REDUCTION INTERNAL FIXATION (ORIF) PROXIMAL PHALANX FRACTURE LEFT SMALL FINGER; SPLINT RING FINGER;  Surgeon: Nicki Reaper, MD;  Location: [facility];  Service: Orthopedics;  Laterality: Left;   POSTERIOR LUMBAR FUSION 4 LEVEL N/A 07/17/2017   Procedure: Thoracic Ten - Lumbar Five revison of hardware with Mazor;  Surgeon: Danielle Dess,  Sherilyn Cooter, MD;  Location: [facility] OR;  Service: Neurosurgery;  Laterality: N/A;  Thoracic/Lumbar   PTOSIS REPAIR Bilateral 07/27/2015   Procedure: PTOSIS REPAIR;  Surgeon: Louisa Second, MD;  Location: [facility];  Service: Plastics;  Laterality: Bilateral;   SHOULDER SURGERY     rt rcr,and lt   TENOLYSIS Left 05/21/2021   Procedure: RELEASE EXTENSOR POLLICIS LONGUS LEFT;  Surgeon: Cindee Salt, MD;  Location: [facility];  Service: Orthopedics;  Laterality: Left;   TENOLYSIS Left 06/08/2021    Procedure: RELEASE EXTENSOR POLLICIS LONGUS LEFT;  Surgeon: Cindee Salt, MD;  Location: [facility];  Service: Orthopedics;  Laterality: Left;   TONSILLECTOMY     TOTAL KNEE ARTHROPLASTY Right 12/23/2019   Procedure: TOTAL KNEE ARTHROPLASTY;  Surgeon: Dannielle Huh, MD;  Location: [facility];  Service: Orthopedics;  Laterality: Right;   TRIGGER FINGER RELEASE Left 05/21/2021   Procedure: RELEASE TRIGGER FINGER/A-1 PULLEY LEFT MIDDLE FINGER;  Surgeon: Cindee Salt, MD;  Location: [facility];  Service: Orthopedics;  Laterality: Left;   TRIGGER FINGER RELEASE Left 06/08/2021   Procedure: RELEASE A-1 PULLEY LEFT MIDDLE FINGER;  Surgeon: Cindee Salt, MD;  Location: [facility];  Service: Orthopedics;  Laterality: Left;   Patient Active Problem List   Diagnosis Date Noted   Myelopathy concurrent with and due to spinal stenosis of thoracic region (HCC) 10/27/2020   S/P total knee arthroplasty, right 12/23/2019   Acalculous cholecystitis 08/24/2018   Osteoarthritis of right knee    Vertigo    DDD (degenerative disc disease), cervical    Benign essential HTN    History of DVT (deep vein thrombosis)    Tachycardia    Steroid-induced hyperglycemia    Hypothyroidism    Neuropathic pain    Acute blood loss anemia    S/P lumbar fusion    Closed L5 vertebral fracture (HCC) 08/31/2017   Closed fracture of fifth lumbar vertebra (HCC) 08/30/2017   Lumbar vertebral fracture (HCC) 07/17/2017   Scoliosis 06/13/2017    PCP: Burton Apley, MD  REFERRING PROVIDER: Sherlean Foot, MD  REFERRING DIAG: S/P left TKA  THERAPY DIAG:  Acute pain of left knee  Localized edema  Difficulty in walking, not elsewhere classified  Stiffness of left knee, not elsewhere classified  Muscle weakness (generalized)  Rationale for Evaluation and Treatment: Rehabilitation  ONSET DATE: 10/17/22  SUBJECTIVE:   SUBJECTIVE STATEMENT: Patient continues to report soreness in the  right hip reports that she called the MD and will see him next Tuesday PERTINENT HISTORY: See above PAIN:  Are you having pain? Yes: NPRS scale: 4/10 Pain location: left knee Pain description: ache, deep Aggravating factors: night, bending, being up on the leg pain a 10/10 Relieving factors: sit down, elevate, pain medication, ice pain can be 0/10  PRECAUTIONS: None  RED FLAGS: None   WEIGHT BEARING RESTRICTIONS: No  FALLS:  Has patient fallen in last 6 months? No  LIVING ENVIRONMENT: Lives with: lives with their family and lives with their spouse Lives in: House/apartment Stairs: Yes: Internal: 12 steps; can reach both Has following equipment at home: Walker - 4 wheeled, Shower bench, and bed side commode  OCCUPATION: retired  PLOF: Independent and was using a SPC, she was doing some cooking and cleaning  PATIENT GOALS: have good ROM, less pain, walk better, go up and down stairs  NEXT MD VISIT: 11/30/22  OBJECTIVE:   DIAGNOSTIC FINDINGS: s/p left TKA  PATIENT SURVEYS:  FOTO 6.5  COGNITION:  Overall cognitive status: Within functional limits for tasks assessed     SENSATION: WFL Numbness laterally  EDEMA:  Circumferential: right mid patella 46 cm, left 55 cm  PALPATION: Very swollen in the knee, the shin and the foot, very tender to palpation in the thigh, knee and shin, very sensitive where the tourniquet was  LOWER EXTREMITY ROM:  Active ROM Left AROM eval Left PROM eval Left AROM  Sitting 11/17/22 L knee AROM 12/08/22  Hip flexion      Hip extension      Hip abduction      Hip adduction      Hip internal rotation      Hip external rotation      Knee flexion 80 85 120 125  Knee extension 27 20 5 8   Ankle dorsiflexion      Ankle plantarflexion      Ankle inversion      Ankle eversion       (Blank rows = not tested)  LOWER EXTREMITY MMT:  MMT Right eval Left eval  Hip flexion    Hip extension    Hip abduction    Hip adduction    Hip  internal rotation    Hip external rotation    Knee flexion  3-  Knee extension  2  Ankle dorsiflexion    Ankle plantarflexion    Ankle inversion    Ankle eversion     (Blank rows = not tested) FUNCTIONAL TESTS:  Timed up and go (TUG): 51 seconds  GAIT: Distance walked: 50 feet Assistive device utilized: Environmental consultant - 2 wheeled Level of assistance: SBA Comments: slow and guarded, small steps   TODAY'S TREATMENT:                                                                                                                              DATE:   12/27/22 Nustep level level 5 x 6 minutes Gait outside negotiating curbs with SPC and CGA/HHA Leg curls 25# 2x12 5# leg extension 2x12 SAQ with cues for TKE Ball squeeze Red tband clamshells Gait with SPC x200 feet 2.5# marches, hip abduction, extension Red tband ankle exercises  12/22/22 Nustep level 5 x 6 mintues 25# HS curls 3x10 5# leg extension 2x10 Supine feet on ball K2C, rotation, small bridge, isometric abs Ball b/n knees squeeze Green tband clamshells Gait with SPC 2x200 feet SBA at times uses the walls Vaso Medium pressure in elevation left knee  12/20/22 Nustep level 5 x 7 minutes 25# HS curls 3x10 5# leg extension 2x10 Gait with SPC and CGA x 220 feet Red tband ankle exercises LEg press 20# 3x10 Standing hip abduction, marching and extension 3# On airex balance beam side stepping with use of the Pbars Gait with SPC x 200 feet CGA  12/15/22 Nustep level 5 x 6 minutes Leg curls 25# 2x10, left only 15# 2x10 Leg extension 10# 2x10, left only 5# 2x10 with ankle weight,  could not do machine Gait outside with walker CGA for curbs 1/2 of parking island 2.5# marches and hip abduction Leg press 20# 2x10 Bridges Vaso left knee medium pressure   12/13/22 NuStep L only L 5 x 6 min Leg press 20lb 2x10 HS curls 35lb 2x10, LLE 15lb x10 Leg Ext 10lb 2x10, LLE 5lb eccentric x10 S2S holding red ball 2x0 with progressing  lowering Alt 2in box taps HHA x 1 2x5 each Gati 66ft w/ SPC   12/08/22 NuStep L only L 5 x 6 min Checked Goals  ROM  TUG  FOTO  Stairs 4in two rails, cues for alternating pattern,12 steps,  Gait w/ SPC 90ft x 2 In RW hip abd & marching 2lb cuff 2x10  2lb cuff alt 4in box taps 2x10 LLE LAQ 2lb 2x10 Vaso 10 mins high pressure 36 degrees F in elevation  12/06/22 L knee PROM flexion and extensions NuStep L only L 5 x 6 min S2S holding red ball x10 then x5  LAQ LLE 3lb 2x10 HS curls green LLE 2x12 Step up 4in box in RW x10 then 6in box x10 Vaso 10 mins high pressure 36 degrees F in elevation    11/24/22 Nustep level 4 x 7 minutes Gentle STM to bilateral knees where she fell PAROM to bilateral knees Red tband HS curls both 3# LAQ with cues for TKE Ball b/n knees squeeze Red tband clamshells Gait with SPC x 200 feet CGA Gait with SPC and CGA outs the back door, negotiated 3 curbs up and down Vaso 10 mins high pressure 36 degrees F in elevation  PATIENT EDUCATION:  Education details: HEP/POC Person educated: Patient Education method: Programmer, multimedia, Facilities manager, Verbal cues, and Handouts Education comprehension: verbalized understanding  HOME EXERCISE PROGRAM: Access Code: [email or web address] URL: [email or web address] Date: 10/27/2022 Prepared by: Stacie Glaze  Exercises - Seated March  - 2 x daily - 7 x weekly - 1 sets - 10 reps - 3 hold - Seated Long Arc Quad  - 2 x daily - 7 x weekly - 1 sets - 10 reps - 3 hold - Seated Heel Raise  - 2 x daily - 7 x weekly - 1 sets - 10 reps - 3 hold - Seated Knee Flexion Stretch  - 1 x daily - 7 x weekly - 1 sets - 10 reps - 15 hold  ASSESSMENT:  CLINICAL IMPRESSION: Patient still with hip pain and issues.  Still very weak to TKE, has balance issues with sPC, really does not trust the leg to step up onto curb, requires CGA and some assist  OBJECTIVE IMPAIRMENTS: Abnormal gait, cardiopulmonary status limiting activity,  decreased activity tolerance, decreased balance, decreased endurance, decreased mobility, difficulty walking, decreased ROM, decreased strength, increased edema, impaired flexibility, improper body mechanics, and pain.   REHAB POTENTIAL: Good  CLINICAL DECISION MAKING: Stable/uncomplicated  EVALUATION COMPLEXITY: Low   GOALS: Goals reviewed with patient? Yes  SHORT TERM GOALS: Target date: 11/11/22 Independent with initial HEP Goal status: met 11/08/22  LONG TERM GOALS: Target date: 01/27/23  Independent with advanced HEP Goal status: INITIAL  2.  Decrease pain 50% Goal status:progressing 12/15/22  3.  Increase AROM to 10-110 degrees flexion Goal status: 11/17/22 MET 5-120  4.  Go up and down stairs safely step over step with hand rails Goal status: Progressing 12/08/22  5.  Decrease swelling by 4cm Goal status: progressing 12/20/22  6.  Decrease TUG to 24 seconds Goal status: Met  17.57 12/08/22   PLAN:  PT  FREQUENCY: 1-2x/week  PT DURATION: 12 weeks  PLANNED INTERVENTIONS: Therapeutic exercises, Therapeutic activity, Neuromuscular re-education, Balance training, Gait training, Patient/Family education, Self Care, Joint mobilization, Joint manipulation, Stair training, Electrical stimulation, Vasopneumatic device, and Manual therapy  PLAN FOR NEXT SESSION:  strength QUAD and hips, ROM for TKE. function and gait   Jearld Lesch, PT

## 2022-12-29 ENCOUNTER — Encounter: Payer: Self-pay | Admitting: Physical Therapy

## 2022-12-29 ENCOUNTER — Ambulatory Visit: Payer: Medicare Other | Admitting: Physical Therapy

## 2022-12-29 DIAGNOSIS — M25562 Pain in left knee: Secondary | ICD-10-CM | POA: Diagnosis not present

## 2022-12-29 DIAGNOSIS — R600 Localized edema: Secondary | ICD-10-CM

## 2022-12-29 DIAGNOSIS — R262 Difficulty in walking, not elsewhere classified: Secondary | ICD-10-CM

## 2022-12-29 DIAGNOSIS — M25662 Stiffness of left knee, not elsewhere classified: Secondary | ICD-10-CM

## 2022-12-29 DIAGNOSIS — M6281 Muscle weakness (generalized): Secondary | ICD-10-CM

## 2022-12-29 NOTE — Therapy (Signed)
OUTPATIENT PHYSICAL THERAPY LOWER EXTREMITY TREATMENT    Patient Name: [name] MRN: [id number] DOB:1933-02-02, 89 y.o., female Today's Date: 12/29/2022  END OF SESSION:  PT End of Session - 12/29/22 1609     Visit Number 16    Date for PT Re-Evaluation 01/27/23    Authorization Type [employer] Medicare    PT Start Time 1605    PT Stop Time 1650    PT Time Calculation (min) 45 min    Activity Tolerance Patient tolerated treatment well    Behavior During Therapy WFL for tasks assessed/performed              Past Medical History:  Diagnosis Date   Acalculous cholecystitis 08/24/2018   Arthritis    DVT of lower extremity (deep venous thrombosis) (HCC)    right leg - after back surgery in 2019   GERD (gastroesophageal reflux disease)    History of blood transfusion    Hypothyroidism    PONV (postoperative nausea and vomiting)    Ptosis of both eyelids    Vertigo    Wears glasses    reading   Past Surgical History:  Procedure Laterality Date   ABDOMINAL HYSTERECTOMY  1986   APPLICATION OF ROBOTIC ASSISTANCE FOR SPINAL PROCEDURE N/A 07/17/2017   Procedure: APPLICATION OF ROBOTIC ASSISTANCE FOR SPINAL PROCEDURE;  Surgeon: Barnett Abu, MD;  Location: [facility] OR;  Service: Neurosurgery;  Laterality: N/A;   APPLICATION OF ROBOTIC ASSISTANCE FOR SPINAL PROCEDURE N/A 09/01/2017   Procedure: APPLICATION OF ROBOTIC ASSISTANCE FOR SPINAL PROCEDURE;  Surgeon: Barnett Abu, MD;  Location: [facility] OR;  Service: Neurosurgery;  Laterality: N/A;   APPLICATION OF ROBOTIC ASSISTANCE FOR SPINAL PROCEDURE N/A 10/27/2020   Procedure: APPLICATION OF ROBOTIC ASSISTANCE FOR SPINAL PROCEDURE;  Surgeon: Barnett Abu, MD;  Location: [facility] OR;  Service: Neurosurgery;  Laterality: N/A;   BACK SURGERY  1986   lumb lam   BREAST BIOPSY Right 2011   CARPAL TUNNEL RELEASE  2000   right   CARPAL TUNNEL RELEASE  2012   left   CHOLECYSTECTOMY N/A 08/24/2018   Procedure: LAPAROSCOPIC CHOLECYSTECTOMY WITH  INTRAOPERATIVE CHOLANGIOGRAM;  Surgeon: Claud Kelp, MD;  Location: [facility] OR;  Service: General;  Laterality: N/A;   COLONOSCOPY     CYSTOCELE REPAIR  2007   sling   DISTAL INTERPHALANGEAL JOINT FUSION Right 02/04/2014   Procedure: FUSION DISTAL INTERPHALANGEAL JOINT RIGHT INDEX FINGER ;  Surgeon: Cindee Salt, MD;  Location: [facility];  Service: Orthopedics;  Laterality: Right;   EYE SURGERY Bilateral    Cataract surgery with lens implant   KNEE SURGERY  2009,2011   partial knee   LAPAROSCOPY N/A 08/24/2018   Procedure: LAPAROSCOPY DIAGNOSTIC;  Surgeon: Claud Kelp, MD;  Location: [facility] OR;  Service: General;  Laterality: N/A;   LUMBAR PERCUTANEOUS PEDICLE SCREW 4 LEVEL N/A 10/27/2020   Procedure: Thoracic nine-ten Laminectomy with extension of fusion from Thoracic ten to Thoracic six with segmental fixation (cemented) with pedicle screw and mazor;  Surgeon: Barnett Abu, MD;  Location: [facility] OR;  Service: Neurosurgery;  Laterality: N/A;   OPEN REDUCTION INTERNAL FIXATION (ORIF) PROXIMAL PHALANX Left 08/30/2013   Procedure: OPEN REDUCTION INTERNAL FIXATION (ORIF) PROXIMAL PHALANX FRACTURE LEFT SMALL FINGER; SPLINT RING FINGER;  Surgeon: Nicki Reaper, MD;  Location: [facility];  Service: Orthopedics;  Laterality: Left;   POSTERIOR LUMBAR FUSION 4 LEVEL N/A 07/17/2017   Procedure: Thoracic Ten - Lumbar Five revison of hardware with Mazor;  Surgeon: Danielle Dess,  Sherilyn Cooter, MD;  Location: [facility] OR;  Service: Neurosurgery;  Laterality: N/A;  Thoracic/Lumbar   PTOSIS REPAIR Bilateral 07/27/2015   Procedure: PTOSIS REPAIR;  Surgeon: Louisa Second, MD;  Location: [facility];  Service: Plastics;  Laterality: Bilateral;   SHOULDER SURGERY     rt rcr,and lt   TENOLYSIS Left 05/21/2021   Procedure: RELEASE EXTENSOR POLLICIS LONGUS LEFT;  Surgeon: Cindee Salt, MD;  Location: [facility];  Service: Orthopedics;  Laterality: Left;   TENOLYSIS Left 06/08/2021    Procedure: RELEASE EXTENSOR POLLICIS LONGUS LEFT;  Surgeon: Cindee Salt, MD;  Location: [facility];  Service: Orthopedics;  Laterality: Left;   TONSILLECTOMY     TOTAL KNEE ARTHROPLASTY Right 12/23/2019   Procedure: TOTAL KNEE ARTHROPLASTY;  Surgeon: Dannielle Huh, MD;  Location: [facility];  Service: Orthopedics;  Laterality: Right;   TRIGGER FINGER RELEASE Left 05/21/2021   Procedure: RELEASE TRIGGER FINGER/A-1 PULLEY LEFT MIDDLE FINGER;  Surgeon: Cindee Salt, MD;  Location: [facility];  Service: Orthopedics;  Laterality: Left;   TRIGGER FINGER RELEASE Left 06/08/2021   Procedure: RELEASE A-1 PULLEY LEFT MIDDLE FINGER;  Surgeon: Cindee Salt, MD;  Location:  SURGERY CENTER;  Service: Orthopedics;  Laterality: Left;   Patient Active Problem List   Diagnosis Date Noted   Myelopathy concurrent with and due to spinal stenosis of thoracic region (HCC) 10/27/2020   S/P total knee arthroplasty, right 12/23/2019   Acalculous cholecystitis 08/24/2018   Osteoarthritis of right knee    Vertigo    DDD (degenerative disc disease), cervical    Benign essential HTN    History of DVT (deep vein thrombosis)    Tachycardia    Steroid-induced hyperglycemia    Hypothyroidism    Neuropathic pain    Acute blood loss anemia    S/P lumbar fusion    Closed L5 vertebral fracture (HCC) 08/31/2017   Closed fracture of fifth lumbar vertebra (HCC) 08/30/2017   Lumbar vertebral fracture (HCC) 07/17/2017   Scoliosis 06/13/2017    PCP: Burton Apley, MD  REFERRING PROVIDER: Sherlean Foot, MD  REFERRING DIAG: S/P left TKA  THERAPY DIAG:  Acute pain of left knee  Localized edema  Difficulty in walking, not elsewhere classified  Stiffness of left knee, not elsewhere classified  Muscle weakness (generalized)  Rationale for Evaluation and Treatment: Rehabilitation  ONSET DATE: 10/17/22  SUBJECTIVE:   SUBJECTIVE STATEMENT: Still with c/o right hip pain PERTINENT  HISTORY: See above PAIN:  Are you having pain? Yes: NPRS scale: 4/10 Pain location: left knee Pain description: ache, deep Aggravating factors: night, bending, being up on the leg pain a 10/10 Relieving factors: sit down, elevate, pain medication, ice pain can be 0/10  PRECAUTIONS: None  RED FLAGS: None   WEIGHT BEARING RESTRICTIONS: No  FALLS:  Has patient fallen in last 6 months? No  LIVING ENVIRONMENT: Lives with: lives with their family and lives with their spouse Lives in: House/apartment Stairs: Yes: Internal: 12 steps; can reach both Has following equipment at home: Walker - 4 wheeled, Shower bench, and bed side commode  OCCUPATION: retired  PLOF: Independent and was using a SPC, she was doing some cooking and cleaning  PATIENT GOALS: have good ROM, less pain, walk better, go up and down stairs  NEXT MD VISIT: 11/30/22  OBJECTIVE:   DIAGNOSTIC FINDINGS: s/p left TKA  PATIENT SURVEYS:  FOTO 6.5  COGNITION: Overall cognitive status: Within functional limits for tasks assessed     SENSATION: [facility]  Numbness laterally  EDEMA:  Circumferential: right mid patella 46 cm, left 55 cm  PALPATION: Very swollen in the knee, the shin and the foot, very tender to palpation in the thigh, knee and shin, very sensitive where the tourniquet was  LOWER EXTREMITY ROM:  Active ROM Left AROM eval Left PROM eval Left AROM  Sitting 11/17/22 L knee AROM 12/08/22  Hip flexion      Hip extension      Hip abduction      Hip adduction      Hip internal rotation      Hip external rotation      Knee flexion 80 85 120 125  Knee extension 27 20 5 8   Ankle dorsiflexion      Ankle plantarflexion      Ankle inversion      Ankle eversion       (Blank rows = not tested)  LOWER EXTREMITY MMT:  MMT Right eval Left eval  Hip flexion    Hip extension    Hip abduction    Hip adduction    Hip internal rotation    Hip external rotation    Knee flexion  3-  Knee extension  2   Ankle dorsiflexion    Ankle plantarflexion    Ankle inversion    Ankle eversion     (Blank rows = not tested) FUNCTIONAL TESTS:  Timed up and go (TUG): 51 seconds, 12/29/22 = 17 seconds  GAIT: Distance walked: 50 feet Assistive device utilized: Environmental consultant - 2 wheeled Level of assistance: SBA Comments: slow and guarded, small steps   TODAY'S TREATMENT:                                                                                                                              DATE:   12/29/22 Nustep level 5 x 6 minutes Leg curls 25# 3x10 5# leg extension 3x10 PROM left knee, scar and patellar mobs Ball b/n knee squeeze Green tband clamshells Standing 2# hip abduction, flexion and extension Gait in clinic with SPC stairs step over step up and down, 3x100' STM to the right glute and ITB  12/27/22 Nustep level level 5 x 6 minutes Gait outside negotiating curbs with SPC and CGA/HHA Leg curls 25# 2x12 5# leg extension 2x12 SAQ with cues for TKE Ball squeeze Red tband clamshells Gait with SPC x200 feet 2.5# marches, hip abduction, extension Red tband ankle exercises  12/22/22 Nustep level 5 x 6 mintues 25# HS curls 3x10 5# leg extension 2x10 Supine feet on ball K2C, rotation, small bridge, isometric abs Ball b/n knees squeeze Green tband clamshells Gait with SPC 2x200 feet SBA at times uses the walls Vaso Medium pressure in elevation left knee  12/20/22 Nustep level 5 x 7 minutes 25# HS curls 3x10 5# leg extension 2x10 Gait with SPC and CGA x 220 feet Red tband ankle exercises LEg press 20# 3x10 Standing hip abduction, marching and extension  3# On airex balance beam side stepping with use of the Pbars Gait with SPC x 200 feet CGA  12/15/22 Nustep level 5 x 6 minutes Leg curls 25# 2x10, left only 15# 2x10 Leg extension 10# 2x10, left only 5# 2x10 with ankle weight, could not do machine Gait outside with walker CGA for curbs 1/2 of parking [employer] 2.5# marches and hip  abduction Leg press 20# 2x10 Bridges Vaso left knee medium pressure   12/13/22 NuStep L only L 5 x 6 min Leg press 20lb 2x10 HS curls 35lb 2x10, LLE 15lb x10 Leg Ext 10lb 2x10, LLE 5lb eccentric x10 S2S holding red ball 2x0 with progressing lowering Alt 2in box taps HHA x 1 2x5 each Gati 12ft w/ SPC   12/08/22 NuStep L only L 5 x 6 min Checked Goals  ROM  TUG  FOTO  Stairs 4in two rails, cues for alternating pattern,12 steps,  Gait w/ SPC 49ft x 2 In RW hip abd & marching 2lb cuff 2x10  2lb cuff alt 4in box taps 2x10 LLE LAQ 2lb 2x10 Vaso 10 mins high pressure 36 degrees F in elevation  12/06/22 L knee PROM flexion and extensions NuStep L only L 5 x 6 min S2S holding red ball x10 then x5  LAQ LLE 3lb 2x10 HS curls green LLE 2x12 Step up 4in box in RW x10 then 6in box x10 Vaso 10 mins high pressure 36 degrees F in elevation    11/24/22 Nustep level 4 x 7 minutes Gentle STM to bilateral knees where she fell PAROM to bilateral knees Red tband HS curls both 3# LAQ with cues for TKE Ball b/n knees squeeze Red tband clamshells Gait with SPC x 200 feet CGA Gait with SPC and CGA outs the back door, negotiated 3 curbs up and down Vaso 10 mins high pressure 36 degrees F in elevation  PATIENT EDUCATION:  Education details: HEP/POC Person educated: Patient Education method: Programmer, multimedia, Facilities manager, Verbal cues, and Handouts Education comprehension: verbalized understanding  HOME EXERCISE PROGRAM: Access Code: [email or web address] URL: [email or web address] Date: 10/27/2022 Prepared by: Stacie Glaze  Exercises - Seated March  - 2 x daily - 7 x weekly - 1 sets - 10 reps - 3 hold - Seated Long Arc Quad  - 2 x daily - 7 x weekly - 1 sets - 10 reps - 3 hold - Seated Heel Raise  - 2 x daily - 7 x weekly - 1 sets - 10 reps - 3 hold - Seated Knee Flexion Stretch  - 1 x daily - 7 x weekly - 1 sets - 10 reps - 15 hold  ASSESSMENT:  CLINICAL IMPRESSION: Patient  still with hip pain and issues.  She is very tender in the right hip and ITB, I added some STM to this today to see if it helps, she is very fearful of walking with the [facility]  OBJECTIVE IMPAIRMENTS: Abnormal gait, cardiopulmonary status limiting activity, decreased activity tolerance, decreased balance, decreased endurance, decreased mobility, difficulty walking, decreased ROM, decreased strength, increased edema, impaired flexibility, improper body mechanics, and pain.   REHAB POTENTIAL: Good  CLINICAL DECISION MAKING: Stable/uncomplicated  EVALUATION COMPLEXITY: Low   GOALS: Goals reviewed with patient? Yes  SHORT TERM GOALS: Target date: 11/11/22 Independent with initial HEP Goal status: met 11/08/22  LONG TERM GOALS: Target date: 01/27/23  Independent with advanced HEP Goal status: progressing 12/29/22  2.  Decrease pain 50% Goal status:progressing 12/29/22  3.  Increase AROM to 10-110 degrees flexion Goal status:  11/17/22 MET 5-120  4.  Go up and down stairs safely step over step with hand rails Goal status: Progressing 12/29/22  5.  Decrease swelling by 4cm Goal status: progressing 12/29/22  6.  Decrease TUG to 24 seconds Goal status: Met  17.57 12/08/22   PLAN:  PT FREQUENCY: 1-2x/week  PT DURATION: 12 weeks  PLANNED INTERVENTIONS: Therapeutic exercises, Therapeutic activity, Neuromuscular re-education, Balance training, Gait training, Patient/Family education, Self Care, Joint mobilization, Joint manipulation, Stair training, Electrical stimulation, Vasopneumatic device, and Manual therapy  PLAN FOR NEXT SESSION:  strength QUAD and hips, ROM for TKE. function and gait   Jearld Lesch, PT

## 2023-01-03 ENCOUNTER — Encounter: Payer: Self-pay | Admitting: Physical Therapy

## 2023-01-03 ENCOUNTER — Ambulatory Visit: Payer: Medicare Other | Attending: Orthopedic Surgery | Admitting: Physical Therapy

## 2023-01-03 DIAGNOSIS — M6281 Muscle weakness (generalized): Secondary | ICD-10-CM | POA: Diagnosis present

## 2023-01-03 DIAGNOSIS — R600 Localized edema: Secondary | ICD-10-CM | POA: Insufficient documentation

## 2023-01-03 DIAGNOSIS — R262 Difficulty in walking, not elsewhere classified: Secondary | ICD-10-CM | POA: Diagnosis present

## 2023-01-03 DIAGNOSIS — M25662 Stiffness of left knee, not elsewhere classified: Secondary | ICD-10-CM | POA: Diagnosis present

## 2023-01-03 DIAGNOSIS — M5459 Other low back pain: Secondary | ICD-10-CM | POA: Diagnosis present

## 2023-01-03 DIAGNOSIS — M25562 Pain in left knee: Secondary | ICD-10-CM | POA: Insufficient documentation

## 2023-01-03 NOTE — Therapy (Signed)
OUTPATIENT PHYSICAL THERAPY LOWER EXTREMITY TREATMENT    Patient Name: ROLANDO GREGER MRN: [id number] DOB:[birth year], 89 y.o., female Today's Date: 01/03/2023  END OF SESSION:  PT End of Session - 01/03/23 1701     Visit Number 17    Date for PT Re-Evaluation 01/27/23    Authorization Type [employer]    PT Start Time 1653    PT Stop Time 1740    PT Time Calculation (min) 47 min    Activity Tolerance Patient tolerated treatment well    Behavior During Therapy WFL for tasks assessed/performed              Past Medical History:  Diagnosis Date   Acalculous cholecystitis 08/24/2018   Arthritis    DVT of lower extremity (deep venous thrombosis) (HCC)    right leg - after back surgery in 2019   GERD (gastroesophageal reflux disease)    History of blood transfusion    Hypothyroidism    PONV (postoperative nausea and vomiting)    Ptosis of both eyelids    Vertigo    Wears glasses    reading   Past Surgical History:  Procedure Laterality Date   ABDOMINAL HYSTERECTOMY  1986   APPLICATION OF ROBOTIC ASSISTANCE FOR SPINAL PROCEDURE N/A 07/17/2017   Procedure: APPLICATION OF ROBOTIC ASSISTANCE FOR SPINAL PROCEDURE;  Surgeon: Barnett Abu, MD;  Location: [facility] OR;  Service: Neurosurgery;  Laterality: N/A;   APPLICATION OF ROBOTIC ASSISTANCE FOR SPINAL PROCEDURE N/A 09/01/2017   Procedure: APPLICATION OF ROBOTIC ASSISTANCE FOR SPINAL PROCEDURE;  Surgeon: Barnett Abu, MD;  Location: [facility] OR;  Service: Neurosurgery;  Laterality: N/A;   APPLICATION OF ROBOTIC ASSISTANCE FOR SPINAL PROCEDURE N/A 10/27/2020   Procedure: APPLICATION OF ROBOTIC ASSISTANCE FOR SPINAL PROCEDURE;  Surgeon: Barnett Abu, MD;  Location: [facility] OR;  Service: Neurosurgery;  Laterality: N/A;   BACK SURGERY  1986   lumb lam   BREAST BIOPSY Right 2011   CARPAL TUNNEL RELEASE  2000   right   CARPAL TUNNEL RELEASE  2012   left   CHOLECYSTECTOMY N/A 08/24/2018   Procedure: LAPAROSCOPIC CHOLECYSTECTOMY WITH  INTRAOPERATIVE CHOLANGIOGRAM;  Surgeon: Claud Kelp, MD;  Location[facility] OR;  Service: General;  Laterality: N/A;   COLONOSCOPY     CYSTOCELE REPAIR  2007   sling   DISTAL INTERPHALANGEAL JOINT FUSION Right 02/04/2014   Procedure: FUSION DISTAL INTERPHALANGEAL JOINT RIGHT INDEX FINGER ;  Surgeon: Cindee Salt, MD;  Location: [facility];  Service: Orthopedics;  Laterality: Right;   EYE SURGERY Bilateral    Cataract surgery with lens implant   KNEE SURGERY  2009,2011   partial knee   LAPAROSCOPY N/A 08/24/2018   Procedure: LAPAROSCOPY DIAGNOSTIC;  Surgeon: Claud Kelp, MD;  Location: [facility] OR;  Service: General;  Laterality: N/A;   LUMBAR PERCUTANEOUS PEDICLE SCREW 4 LEVEL N/A 10/27/2020   Procedure: Thoracic nine-ten Laminectomy with extension of fusion from Thoracic ten to Thoracic six with segmental fixation (cemented) with pedicle screw and mazor;  Surgeon: Barnett Abu, MD;  Location: [facility] OR;  Service: Neurosurgery;  Laterality: N/A;   OPEN REDUCTION INTERNAL FIXATION (ORIF) PROXIMAL PHALANX Left 08/30/2013   Procedure: OPEN REDUCTION INTERNAL FIXATION (ORIF) PROXIMAL PHALANX FRACTURE LEFT SMALL FINGER; SPLINT RING FINGER;  Surgeon: Nicki Reaper, MD;  Location: [facility];  Service: Orthopedics;  Laterality: Left;   POSTERIOR LUMBAR FUSION 4 LEVEL N/A 07/17/2017   Procedure: Thoracic Ten - Lumbar Five revison of hardware with Mazor;  Surgeon: Danielle Dess,  Sherilyn Cooter, MD;  Location: [facility] OR;  Service: Neurosurgery;  Laterality: N/A;  Thoracic/Lumbar   PTOSIS REPAIR Bilateral 07/27/2015   Procedure: PTOSIS REPAIR;  Surgeon: Louisa Second, MD;  Location: [facility];  Service: Plastics;  Laterality: Bilateral;   SHOULDER SURGERY     rt rcr,and lt   TENOLYSIS Left 05/21/2021   Procedure: RELEASE EXTENSOR POLLICIS LONGUS LEFT;  Surgeon: Cindee Salt, MD;  Location: [facility];  Service: Orthopedics;  Laterality: Left;   TENOLYSIS Left 06/08/2021    Procedure: RELEASE EXTENSOR POLLICIS LONGUS LEFT;  Surgeon: Cindee Salt, MD;  Location: [facility];  Service: Orthopedics;  Laterality: Left;   TONSILLECTOMY     TOTAL KNEE ARTHROPLASTY Right 12/23/2019   Procedure: TOTAL KNEE ARTHROPLASTY;  Surgeon: Dannielle Huh, MD;  Location: [facility];  Service: Orthopedics;  Laterality: Right;   TRIGGER FINGER RELEASE Left 05/21/2021   Procedure: RELEASE TRIGGER FINGER/A-1 PULLEY LEFT MIDDLE FINGER;  Surgeon: Cindee Salt, MD;  Location: [facility];  Service: Orthopedics;  Laterality: Left;   TRIGGER FINGER RELEASE Left 06/08/2021   Procedure: RELEASE A-1 PULLEY LEFT MIDDLE FINGER;  Surgeon: Cindee Salt, MD;  Location:  SURGERY CENTER;  Service: Orthopedics;  Laterality: Left;   Patient Active Problem List   Diagnosis Date Noted   Myelopathy concurrent with and due to spinal stenosis of thoracic region (HCC) 10/27/2020   S/P total knee arthroplasty, right 12/23/2019   Acalculous cholecystitis 08/24/2018   Osteoarthritis of right knee    Vertigo    DDD (degenerative disc disease), cervical    Benign essential HTN    History of DVT (deep vein thrombosis)    Tachycardia    Steroid-induced hyperglycemia    Hypothyroidism    Neuropathic pain    Acute blood loss anemia    S/P lumbar fusion    Closed L5 vertebral fracture (HCC) 08/31/2017   Closed fracture of fifth lumbar vertebra (HCC) 08/30/2017   Lumbar vertebral fracture (HCC) 07/17/2017   Scoliosis 06/13/2017    PCP: Burton Apley, MD  REFERRING PROVIDER: Sherlean Foot, MD  REFERRING DIAG: S/P left TKA  THERAPY DIAG:  Acute pain of left knee  Localized edema  Difficulty in walking, not elsewhere classified  Stiffness of left knee, not elsewhere classified  Muscle weakness (generalized)  Rationale for Evaluation and Treatment: Rehabilitation  ONSET DATE: 10/17/22  SUBJECTIVE:   SUBJECTIVE STATEMENT: Hip is still hurting PERTINENT HISTORY: See  above PAIN:  Are you having pain? Yes: NPRS scale: 4/10 Pain location: left knee Pain description: ache, deep Aggravating factors: night, bending, being up on the leg pain a 10/10 Relieving factors: sit down, elevate, pain medication, ice pain can be 0/10  PRECAUTIONS: None  RED FLAGS: None   WEIGHT BEARING RESTRICTIONS: No  FALLS:  Has patient fallen in last 6 months? No  LIVING ENVIRONMENT: Lives with: lives with their family and lives with their spouse Lives in: House/apartment Stairs: Yes: Internal: 12 steps; can reach both Has following equipment at home: Walker - 4 wheeled, Shower bench, and bed side commode  OCCUPATION: retired  PLOF: Independent and was using a SPC, she was doing some cooking and cleaning  PATIENT GOALS: have good ROM, less pain, walk better, go up and down stairs  NEXT MD VISIT: 11/30/22  OBJECTIVE:   DIAGNOSTIC FINDINGS: s/p left TKA  PATIENT SURVEYS:  FOTO 6.5  COGNITION: Overall cognitive status: Within functional limits for tasks assessed     SENSATION: WFL Numbness laterally  EDEMA:  Circumferential: right mid patella 46 cm, left 55 cm  PALPATION: Very swollen in the knee, the shin and the foot, very tender to palpation in the thigh, knee and shin, very sensitive where the tourniquet was  LOWER EXTREMITY ROM:  Active ROM Left AROM eval Left PROM eval Left AROM  Sitting 11/17/22 L knee AROM 12/08/22  Hip flexion      Hip extension      Hip abduction      Hip adduction      Hip internal rotation      Hip external rotation      Knee flexion 80 85 120 125  Knee extension 27 20 5 8   Ankle dorsiflexion      Ankle plantarflexion      Ankle inversion      Ankle eversion       (Blank rows = not tested)  LOWER EXTREMITY MMT:  MMT Right eval Left eval  Hip flexion    Hip extension    Hip abduction    Hip adduction    Hip internal rotation    Hip external rotation    Knee flexion  3-  Knee extension  2  Ankle  dorsiflexion    Ankle plantarflexion    Ankle inversion    Ankle eversion     (Blank rows = not tested) FUNCTIONAL TESTS:  Timed up and go (TUG): 51 seconds, 12/29/22 = 17 seconds  GAIT: Distance walked: 50 feet Assistive device utilized: Environmental consultant - 2 wheeled Level of assistance: SBA Comments: slow and guarded, small steps   TODAY'S TREATMENT:                                                                                                                              DATE:   01/03/23 Nustep level 5 x 6 minutes Leg extension 5# 2x10 Leg curls 25# 2x10 Seated ball b/n knees squeeze Gait with SPC CGA 200 feet x 2, occasional LOB with distraction and will use the walls for stability Green tband clamshells in sitting Red tband ankle exercises SAQ 3# Sidelying clamshells Sidelying right hip STM with the theragun  12/29/22 Nustep level 5 x 6 minutes Leg curls 25# 3x10 5# leg extension 3x10 PROM left knee, scar and patellar mobs Ball b/n knee squeeze Green tband clamshells Standing 2# hip abduction, flexion and extension Gait in clinic with SPC stairs step over step up and down, 3x100' STM to the right glute and ITB  12/27/22 Nustep level level 5 x 6 minutes Gait outside negotiating curbs with SPC and CGA/HHA Leg curls 25# 2x12 5# leg extension 2x12 SAQ with cues for TKE Ball squeeze Red tband clamshells Gait with SPC x200 feet 2.5# marches, hip abduction, extension Red tband ankle exercises  12/22/22 Nustep level 5 x 6 mintues 25# HS curls 3x10 5# leg extension 2x10 Supine feet on ball K2C, rotation, small bridge, isometric abs Ball b/n knees squeeze Green tband  clamshells Gait with SPC 2x200 feet SBA at times uses the walls Vaso Medium pressure in elevation left knee  12/20/22 Nustep level 5 x 7 minutes 25# HS curls 3x10 5# leg extension 2x10 Gait with SPC and CGA x 220 feet Red tband ankle exercises LEg press 20# 3x10 Standing hip abduction, marching and  extension 3# On airex balance beam side stepping with use of the Pbars Gait with SPC x 200 feet CGA  12/15/22 Nustep level 5 x 6 minutes Leg curls 25# 2x10, left only 15# 2x10 Leg extension 10# 2x10, left only 5# 2x10 with ankle weight, could not do machine Gait outside with walker CGA for curbs 1/2 of parking [employer] 2.5# marches and hip abduction Leg press 20# 2x10 Bridges Vaso left knee medium pressure   12/13/22 NuStep L only L 5 x 6 min Leg press 20lb 2x10 HS curls 35lb 2x10, LLE 15lb x10 Leg Ext 10lb 2x10, LLE 5lb eccentric x10 S2S holding red ball 2x0 with progressing lowering Alt 2in box taps HHA x 1 2x5 each Gati 45ft w/ SPC   12/08/22 NuStep L only L 5 x 6 min Checked Goals  ROM  TUG  FOTO  Stairs 4in two rails, cues for alternating pattern,12 steps,  Gait w/ SPC 46ft x 2 In RW hip abd & marching 2lb cuff 2x10  2lb cuff alt 4in box taps 2x10 LLE LAQ 2lb 2x10 Vaso 10 mins high pressure 36 degrees F in elevation  12/06/22 L knee PROM flexion and extensions NuStep L only L 5 x 6 min S2S holding red ball x10 then x5  LAQ LLE 3lb 2x10 HS curls green LLE 2x12 Step up 4in box in RW x10 then 6in box x10 Vaso 10 mins high pressure 36 degrees F in elevation   PATIENT EDUCATION:  Education details: HEP/POC Person educated: Patient Education method: Programmer, multimedia, Facilities manager, Verbal cues, and Handouts Education comprehension: verbalized understanding  HOME EXERCISE PROGRAM: Access Code: [email or web address] URL: [email or web address] Date: 10/27/2022 Prepared by: Stacie Glaze  Exercises - Seated March  - 2 x daily - 7 x weekly - 1 sets - 10 reps - 3 hold - Seated Long Arc Quad  - 2 x daily - 7 x weekly - 1 sets - 10 reps - 3 hold - Seated Heel Raise  - 2 x daily - 7 x weekly - 1 sets - 10 reps - 3 hold - Seated Knee Flexion Stretch  - 1 x daily - 7 x weekly - 1 sets - 10 reps - 15 hold  ASSESSMENT:  CLINICAL IMPRESSION: Patient continues to have the  right hip pain.  I did work on this again when she left she was moving easier and reported no pain, we may need to add some flexibility.  With the walking with the cane she again is unsafe at times usually with a distraction she will lose her balance, also uses walls for stability  OBJECTIVE IMPAIRMENTS: Abnormal gait, cardiopulmonary status limiting activity, decreased activity tolerance, decreased balance, decreased endurance, decreased mobility, difficulty walking, decreased ROM, decreased strength, increased edema, impaired flexibility, improper body mechanics, and pain.   REHAB POTENTIAL: Good  CLINICAL DECISION MAKING: Stable/uncomplicated  EVALUATION COMPLEXITY: Low   GOALS: Goals reviewed with patient? Yes  SHORT TERM GOALS: Target date: 11/11/22 Independent with initial HEP Goal status: met 11/08/22  LONG TERM GOALS: Target date: 01/27/23  Independent with advanced HEP Goal status: progressing 12/29/22  2.  Decrease pain 50% Goal status:progressing 12/29/22  3.  Increase  AROM to 10-110 degrees flexion Goal status: 11/17/22 MET 5-120  4.  Go up and down stairs safely step over step with hand rails Goal status: Progressing 12/29/22  5.  Decrease swelling by 4cm Goal status: progressing 12/29/22  6.  Decrease TUG to 24 seconds Goal status: Met  17.57 12/08/22   PLAN:  PT FREQUENCY: 1-2x/week  PT DURATION: 12 weeks  PLANNED INTERVENTIONS: Therapeutic exercises, Therapeutic activity, Neuromuscular re-education, Balance training, Gait training, Patient/Family education, Self Care, Joint mobilization, Joint manipulation, Stair training, Electrical stimulation, Vasopneumatic device, and Manual therapy  PLAN FOR NEXT SESSION:  strength QUAD and hips, ROM for TKE. function and gait with SPC if we can get her safe   Jearld Lesch, PT

## 2023-01-05 ENCOUNTER — Encounter: Payer: Self-pay | Admitting: Physical Therapy

## 2023-01-05 ENCOUNTER — Ambulatory Visit: Payer: Medicare Other | Admitting: Physical Therapy

## 2023-01-05 DIAGNOSIS — R262 Difficulty in walking, not elsewhere classified: Secondary | ICD-10-CM

## 2023-01-05 DIAGNOSIS — R600 Localized edema: Secondary | ICD-10-CM

## 2023-01-05 DIAGNOSIS — M25662 Stiffness of left knee, not elsewhere classified: Secondary | ICD-10-CM

## 2023-01-05 DIAGNOSIS — M6281 Muscle weakness (generalized): Secondary | ICD-10-CM

## 2023-01-05 DIAGNOSIS — M25562 Pain in left knee: Secondary | ICD-10-CM | POA: Diagnosis not present

## 2023-01-05 NOTE — Therapy (Signed)
OUTPATIENT PHYSICAL THERAPY LOWER EXTREMITY TREATMENT    Patient Name: [name] MRN: [id number] DOB:[birth year], 89 y.o., female Today's Date: 01/05/2023  END OF SESSION:  PT End of Session - 01/05/23 1551     Visit Number 18    Date for PT Re-Evaluation 01/27/23    PT Start Time 1555    PT Stop Time 1640    PT Time Calculation (min) 45 min    Activity Tolerance Patient tolerated treatment well    Behavior During Therapy [facility] for tasks assessed/performed              Past Medical History:  Diagnosis Date   Acalculous cholecystitis 08/24/2018   Arthritis    DVT of lower extremity (deep venous thrombosis) (HCC)    right leg - after back surgery in 2019   GERD (gastroesophageal reflux disease)    History of blood transfusion    Hypothyroidism    PONV (postoperative nausea and vomiting)    Ptosis of both eyelids    Vertigo    Wears glasses    reading   Past Surgical History:  Procedure Laterality Date   ABDOMINAL HYSTERECTOMY  1986   APPLICATION OF ROBOTIC ASSISTANCE FOR SPINAL PROCEDURE N/A 07/17/2017   Procedure: APPLICATION OF ROBOTIC ASSISTANCE FOR SPINAL PROCEDURE;  Surgeon: Barnett Abu, MD;  Location: [facility] OR;  Service: Neurosurgery;  Laterality: N/A;   APPLICATION OF ROBOTIC ASSISTANCE FOR SPINAL PROCEDURE N/A 09/01/2017   Procedure: APPLICATION OF ROBOTIC ASSISTANCE FOR SPINAL PROCEDURE;  Surgeon: Barnett Abu, MD;  Location: [facility] OR;  Service: Neurosurgery;  Laterality: N/A;   APPLICATION OF ROBOTIC ASSISTANCE FOR SPINAL PROCEDURE N/A 10/27/2020   Procedure: APPLICATION OF ROBOTIC ASSISTANCE FOR SPINAL PROCEDURE;  Surgeon: Barnett Abu, MD;  Location: [facility] OR;  Service: Neurosurgery;  Laterality: N/A;   BACK SURGERY  1986   lumb lam   BREAST BIOPSY Right 2011   CARPAL TUNNEL RELEASE  2000   right   CARPAL TUNNEL RELEASE  2012   left   CHOLECYSTECTOMY N/A 08/24/2018   Procedure: LAPAROSCOPIC CHOLECYSTECTOMY WITH INTRAOPERATIVE CHOLANGIOGRAM;   Surgeon: Claud Kelp, MD;  Location: [facility] OR;  Service: General;  Laterality: N/A;   COLONOSCOPY     CYSTOCELE REPAIR  2007   sling   DISTAL INTERPHALANGEAL JOINT FUSION Right 02/04/2014   Procedure: FUSION DISTAL INTERPHALANGEAL JOINT RIGHT INDEX FINGER ;  Surgeon: Cindee Salt, MD;  Location: [facility];  Service: Orthopedics;  Laterality: Right;   EYE SURGERY Bilateral    Cataract surgery with lens implant   KNEE SURGERY  2009,2011   partial knee   LAPAROSCOPY N/A 08/24/2018   Procedure: LAPAROSCOPY DIAGNOSTIC;  Surgeon: Claud Kelp, MD;  Location: [facility] OR;  Service: General;  Laterality: N/A;   LUMBAR PERCUTANEOUS PEDICLE SCREW 4 LEVEL N/A 10/27/2020   Procedure: Thoracic nine-ten Laminectomy with extension of fusion from Thoracic ten to Thoracic six with segmental fixation (cemented) with pedicle screw and mazor;  Surgeon: Barnett Abu, MD;  Location: [facility] OR;  Service: Neurosurgery;  Laterality: N/A;   OPEN REDUCTION INTERNAL FIXATION (ORIF) PROXIMAL PHALANX Left 08/30/2013   Procedure: OPEN REDUCTION INTERNAL FIXATION (ORIF) PROXIMAL PHALANX FRACTURE LEFT SMALL FINGER; SPLINT RING FINGER;  Surgeon: Nicki Reaper, MD;  Location: [facility];  Service: Orthopedics;  Laterality: Left;   POSTERIOR LUMBAR FUSION 4 LEVEL N/A 07/17/2017   Procedure: Thoracic Ten - Lumbar Five revison of hardware with Mazor;  Surgeon: Barnett Abu, MD;  Location: [facility] OR;  Service: Neurosurgery;  Laterality: N/A;  Thoracic/Lumbar   PTOSIS REPAIR Bilateral 07/27/2015   Procedure: PTOSIS REPAIR;  Surgeon: Louisa Second, MD;  Location: [facility];  Service: Plastics;  Laterality: Bilateral;   SHOULDER SURGERY     rt rcr,and lt   TENOLYSIS Left 05/21/2021   Procedure: RELEASE EXTENSOR POLLICIS LONGUS LEFT;  Surgeon: Cindee Salt, MD;  Location: [facility];  Service: Orthopedics;  Laterality: Left;   TENOLYSIS Left 06/08/2021   Procedure: RELEASE EXTENSOR  POLLICIS LONGUS LEFT;  Surgeon: Cindee Salt, MD;  Location: [facility];  Service: Orthopedics;  Laterality: Left;   TONSILLECTOMY     TOTAL KNEE ARTHROPLASTY Right 12/23/2019   Procedure: TOTAL KNEE ARTHROPLASTY;  Surgeon: Dannielle Huh, MD;  Location: [facility];  Service: Orthopedics;  Laterality: Right;   TRIGGER FINGER RELEASE Left 05/21/2021   Procedure: RELEASE TRIGGER FINGER/A-1 PULLEY LEFT MIDDLE FINGER;  Surgeon: Cindee Salt, MD;  Location: [facility];  Service: Orthopedics;  Laterality: Left;   TRIGGER FINGER RELEASE Left 06/08/2021   Procedure: RELEASE A-1 PULLEY LEFT MIDDLE FINGER;  Surgeon: Cindee Salt, MD;  Location:  SUR[facility]ERY [facility];  Service: Orthopedics;  Laterality: Left;   Patient Active Problem List   Diagnosis Date Noted   Myelopathy concurrent with and due to spinal stenosis of thoracic region (HCC) 10/27/2020   S/P total knee arthroplasty, right 12/23/2019   Acalculous cholecystitis 08/24/2018   Osteoarthritis of right knee    Vertigo    DDD (degenerative disc disease), cervical    Benign essential HTN    History of DVT (deep vein thrombosis)    Tachycardia    Steroid-induced hyperglycemia    Hypothyroidism    Neuropathic pain    Acute blood loss anemia    S/P lumbar fusion    Closed L5 vertebral fracture (HCC) 08/31/2017   Closed fracture of fifth lumbar vertebra (HCC) 08/30/2017   Lumbar vertebral fracture (HCC) 07/17/2017   Scoliosis 06/13/2017    PCP: Burton Apley, MD  REFERRING PROVIDER: Sherlean Foot, MD  REFERRING DIAG: S/P left TKA  THERAPY DIAG:  Localized edema  Stiffness of left knee, not elsewhere classified  Difficulty in walking, not elsewhere classified  Muscle weakness (generalized)  Rationale for Evaluation and Treatment: Rehabilitation  ONSET DATE: 10/17/22  SUBJECTIVE:   SUBJECTIVE STATEMENT: L knee is still bothering her   PERTINENT HISTORY: See above PAIN:  Are you having pain? Yes: NPRS  scale: 2/10 Pain location: left knee Pain description: ache, deep Aggravating factors: night, bending, being up on the leg pain a 10/10 Relieving factors: sit down, elevate, pain medication, ice pain can be 0/10  PRECAUTIONS: None  RED FLAGS: None   WEIGHT BEARING RESTRICTIONS: No  FALLS:  Has patient fallen in last 6 months? No  LIVING ENVIRONMENT: Lives with: lives with their family and lives with their spouse Lives in: House/apartment Stairs: Yes: Internal: 12 steps; can reach both Has following equipment at home: Environmental consultant - 4 wheeled, Shower bench, and bed side commode  OCCUPATION: retired  PLOF: Independent and was using a SPC, she was doing some cooking and cleaning  PATIENT GOALS: have good ROM, less pain, walk better, go up and down stairs  NEXT MD VISIT: 11/30/22  OBJECTIVE:   DIAGNOSTIC FINDINGS: s/p left TKA  PATIENT SURVEYS:  FOTO 6.5  COGNITION: Overall cognitive status: Within functional limits for tasks assessed     SENSATION: WFL Numbness laterally  EDEMA:  Circumferential: right mid patella 46 cm,  left 55 cm  PALPATION: Very swollen in the knee, the shin and the foot, very tender to palpation in the thigh, knee and shin, very sensitive where the tourniquet was  LOWER EXTREMITY ROM:  Active ROM Left AROM eval Left PROM eval Left AROM  Sitting 11/17/22 L knee AROM 12/08/22  Hip flexion      Hip extension      Hip abduction      Hip adduction      Hip internal rotation      Hip external rotation      Knee flexion 80 85 120 125  Knee extension 27 20 5 8   Ankle dorsiflexion      Ankle plantarflexion      Ankle inversion      Ankle eversion       (Blank rows = not tested)  LOWER EXTREMITY MMT:  MMT Right eval Left eval  Hip flexion    Hip extension    Hip abduction    Hip adduction    Hip internal rotation    Hip external rotation    Knee flexion  3-  Knee extension  2  Ankle dorsiflexion    Ankle plantarflexion    Ankle  inversion    Ankle eversion     (Blank rows = not tested) FUNCTIONAL TESTS:  Timed up and go (TUG): 51 seconds, 12/29/22 = 17 seconds  GAIT: Distance walked: 50 feet Assistive device utilized: Walker - 2 wheeled Level of assistance: SBA Comments: slow and guarded, small steps   TODAY'S TREATMENT:                                                                                                                              DATE:   01/05/23 Nustep level 5 x 6 minutes LE only  Leg curls 25# 2x12, LLE 15lb 2x10 Leg extension 5# 2x10 LLE 10lb eccentrics 2x5 Gait with SPC CGA 200 feet x 2, occasional LOB with distraction and will use the walls for stability Gait 162ft first 75 w/ SPC second 75 HHA x1 Green tband clamshells in sitting Hip Add ball squeeze 2x10 Alt 6in box taps 3lb cuff 2x10 Step up one rail 4inx10, 6 in x5 each  01/03/23 Nustep level 5 x 6 minutes Leg extension 5# 2x10 Leg curls 25# 2x10 Seated ball b/n knees squeeze Gait with SPC CGA 200 feet x 2, occasional LOB with distraction and will use the walls for stability Green tband clamshells in sitting Red tband ankle exercises SAQ 3# Sidelying clamshells Sidelying right hip STM with the theragun  12/29/22 Nustep level 5 x 6 minutes Leg curls 25# 3x10 5# leg extension 3x10 PROM left knee, scar and patellar mobs Ball b/n knee squeeze Green tband clamshells Standing 2# hip abduction, flexion and extension Gait in clinic with SPC stairs step over step up and down, 3x100' STM to the right glute and ITB  12/27/22 Nustep level level 5 x 6  minutes Gait outside negotiating curbs with SPC and CGA/HHA Leg curls 25# 2x12 5# leg extension 2x12 SAQ with cues for TKE Ball squeeze Red tband clamshells Gait with SPC x200 feet 2.5# marches, hip abduction, extension Red tband ankle exercises  12/22/22 Nustep level 5 x 6 mintues 25# HS curls 3x10 5# leg extension 2x10 Supine feet on ball K2C, rotation, small bridge,  isometric abs Ball b/n knees squeeze Green tband clamshells Gait with SPC 2x200 feet SBA at times uses the walls Vaso Medium pressure in elevation left knee  12/20/22 Nustep level 5 x 7 minutes 25# HS curls 3x10 5# leg extension 2x10 Gait with SPC and CGA x 220 feet Red tband ankle exercises LEg press 20# 3x10 Standing hip abduction, marching and extension 3# On airex balance beam side stepping with use of the Pbars Gait with SPC x 200 feet CGA  12/15/22 Nustep level 5 x 6 minutes Leg curls 25# 2x10, left only 15# 2x10 Leg extension 10# 2x10, left only 5# 2x10 with ankle weight, could not do machine Gait outside with walker CGA for curbs 1/2 of parking [employer] 2.5# marches and hip abduction Leg press 20# 2x10 Bridges Vaso left knee medium pressure   12/13/22 NuStep L only L 5 x 6 min Leg press 20lb 2x10 HS curls 35lb 2x10, LLE 15lb x10 Leg Ext 10lb 2x10, LLE 5lb eccentric x10 S2S holding red ball 2x0 with progressing lowering Alt 2in box taps HHA x 1 2x5 each Gati 88ft w/ SPC   12/08/22 NuStep L only L 5 x 6 min Checked Goals  ROM  TUG  FOTO  Stairs 4in two rails, cues for alternating pattern,12 steps,  Gait w/ SPC 74ft x 2 In RW hip abd & marching 2lb cuff 2x10  2lb cuff alt 4in box taps 2x10 LLE LAQ 2lb 2x10 Vaso 10 mins high pressure 36 degrees F in elevation    PATIENT EDUCATION:  Education details: HEP/POC Person educated: Patient Education method: Programmer, multimedia, Facilities manager, Verbal cues, and Handouts Education comprehension: verbalized understanding  HOME EXERCISE PROGRAM: Access Code: [email or web address] URL: [email or web address] Date: 10/27/2022 Prepared by: Stacie Glaze  Exercises - Seated March  - 2 x daily - 7 x weekly - 1 sets - 10 reps - 3 hold - Seated Long Arc Quad  - 2 x daily - 7 x weekly - 1 sets - 10 reps - 3 hold - Seated Heel Raise  - 2 x daily - 7 x weekly - 1 sets - 10 reps - 3 hold - Seated Knee Flexion Stretch  - 1 x daily  - 7 x weekly - 1 sets - 10 reps - 15 hold  ASSESSMENT:  CLINICAL IMPRESSION: Patient with less R hip pain today. With the walking with the cane she again is unsafe at times usually with a distraction she will lose her balance but recovered on her own. CGA needed at times with alt box taps. Bilateral LE weakness present with step ups. Al interventions completed well without reports of increase pain.  OBJECTIVE IMPAIRMENTS: Abnormal gait, cardiopulmonary status limiting activity, decreased activity tolerance, decreased balance, decreased endurance, decreased mobility, difficulty walking, decreased ROM, decreased strength, increased edema, impaired flexibility, improper body mechanics, and pain.   REHAB POTENTIAL: Good  CLINICAL DECISION MAKING: Stable/uncomplicated  EVALUATION COMPLEXITY: Low   GOALS: Goals reviewed with patient? Yes  SHORT TERM GOALS: Target date: 11/11/22 Independent with initial HEP Goal status: met 11/08/22  LONG TERM GOALS: Target date: 01/27/23  Independent with advanced HEP  Goal status: progressing 12/29/22  2.  Decrease pain 50% Goal status:progressing 12/29/22  3.  Increase AROM to 10-110 degrees flexion Goal status: 11/17/22 MET 5-120  4.  Go up and down stairs safely step over step with hand rails Goal status: Progressing 12/29/22  5.  Decrease swelling by 4cm Goal status: progressing 12/29/22  6.  Decrease TUG to 24 seconds Goal status: Met  17.57 12/08/22   PLAN:  PT FREQUENCY: 1-2x/week  PT DURATION: 12 weeks  PLANNED INTERVENTIONS: Therapeutic exercises, Therapeutic activity, Neuromuscular re-education, Balance training, Gait training, Patient/Family education, Self Care, Joint mobilization, Joint manipulation, Stair training, Electrical stimulation, Vasopneumatic device, and Manual therapy  PLAN FOR NEXT SESSION:  strength QUAD and hips, ROM for TKE. function and gait with SPC if we can get her safe   Grayce Sessions, PTA

## 2023-01-10 ENCOUNTER — Encounter: Payer: Self-pay | Admitting: Physical Therapy

## 2023-01-10 ENCOUNTER — Ambulatory Visit: Payer: Medicare Other | Admitting: Physical Therapy

## 2023-01-10 DIAGNOSIS — M6281 Muscle weakness (generalized): Secondary | ICD-10-CM

## 2023-01-10 DIAGNOSIS — R262 Difficulty in walking, not elsewhere classified: Secondary | ICD-10-CM

## 2023-01-10 DIAGNOSIS — M5459 Other low back pain: Secondary | ICD-10-CM

## 2023-01-10 DIAGNOSIS — R600 Localized edema: Secondary | ICD-10-CM

## 2023-01-10 DIAGNOSIS — M25562 Pain in left knee: Secondary | ICD-10-CM | POA: Diagnosis not present

## 2023-01-10 DIAGNOSIS — M25662 Stiffness of left knee, not elsewhere classified: Secondary | ICD-10-CM

## 2023-01-10 NOTE — Therapy (Signed)
OUTPATIENT PHYSICAL THERAPY LOWER EXTREMITY TREATMENT    Patient Name: [name] MRN: [id number] DOB:December 02, 1932, 89 y.o., female Today's Date: 01/10/2023  END OF SESSION:  PT End of Session - 01/10/23 1604     Visit Number 19    Date for PT Re-Evaluation 01/27/23    Authorization Type [employer] Medicare    PT Start Time 1600    PT Stop Time 1645    PT Time Calculation (min) 45 min    Activity Tolerance Patient tolerated treatment well    Behavior During Therapy WFL for tasks assessed/performed              Past Medical History:  Diagnosis Date   Acalculous cholecystitis 08/24/2018   Arthritis    DVT of lower extremity (deep venous thrombosis) (HCC)    right leg - after back surgery in 2019   GERD (gastroesophageal reflux disease)    History of blood transfusion    Hypothyroidism    PONV (postoperative nausea and vomiting)    Ptosis of both eyelids    Vertigo    Wears glasses    reading   Past Surgical History:  Procedure Laterality Date   ABDOMINAL HYSTERECTOMY  1986   APPLICATION OF ROBOTIC ASSISTANCE FOR SPINAL PROCEDURE N/A 07/17/2017   Procedure: APPLICATION OF ROBOTIC ASSISTANCE FOR SPINAL PROCEDURE;  Surgeon: Barnett Abu, MD;  Location: [facility] OR;  Service: Neurosurgery;  Laterality: N/A;   APPLICATION OF ROBOTIC ASSISTANCE FOR SPINAL PROCEDURE N/A 09/01/2017   Procedure: APPLICATION OF ROBOTIC ASSISTANCE FOR SPINAL PROCEDURE;  Surgeon: Barnett Abu, MD;  Location: [facility] OR;  Service: Neurosurgery;  Laterality: N/A;   APPLICATION OF ROBOTIC ASSISTANCE FOR SPINAL PROCEDURE N/A 10/27/2020   Procedure: APPLICATION OF ROBOTIC ASSISTANCE FOR SPINAL PROCEDURE;  Surgeon: Barnett Abu, MD;  Location: [facility] OR;  Service: Neurosurgery;  Laterality: N/A;   BACK SURGERY  1986   lumb lam   BREAST BIOPSY Right 2011   CARPAL TUNNEL RELEASE  2000   right   CARPAL TUNNEL RELEASE  2012   left   CHOLECYSTECTOMY N/A 08/24/2018   Procedure: LAPAROSCOPIC CHOLECYSTECTOMY WITH  INTRAOPERATIVE CHOLANGIOGRAM;  Surgeon: Claud Kelp, MD;  Location: [facility] OR;  Service: General;  Laterality: N/A;   COLONOSCOPY     CYSTOCELE REPAIR  2007   sling   DISTAL INTERPHALANGEAL JOINT FUSION Right 02/04/2014   Procedure: FUSION DISTAL INTERPHALANGEAL JOINT RIGHT INDEX FINGER ;  Surgeon: Cindee Salt, MD;  Location: [facility];  Service: Orthopedics;  Laterality: Right;   EYE SURGERY Bilateral    Cataract surgery with lens implant   KNEE SURGERY  2009,2011   partial knee   LAPAROSCOPY N/A 08/24/2018   Procedure: LAPAROSCOPY DIAGNOSTIC;  Surgeon: Claud Kelp, MD;  Location: [facility] OR;  Service: General;  Laterality: N/A;   LUMBAR PERCUTANEOUS PEDICLE SCREW 4 LEVEL N/A 10/27/2020   Procedure: Thoracic nine-ten Laminectomy with extension of fusion from Thoracic ten to Thoracic six with segmental fixation (cemented) with pedicle screw and mazor;  Surgeon: Barnett Abu, MD;  Location: [facility] OR;  Service: Neurosurgery;  Laterality: N/A;   OPEN REDUCTION INTERNAL FIXATION (ORIF) PROXIMAL PHALANX Left 08/30/2013   Procedure: OPEN REDUCTION INTERNAL FIXATION (ORIF) PROXIMAL PHALANX FRACTURE LEFT SMALL FINGER; SPLINT RING FINGER;  Surgeon: Nicki Reaper, MD;  Location: [facility];  Service: Orthopedics;  Laterality: Left;   POSTERIOR LUMBAR FUSION 4 LEVEL N/A 07/17/2017   Procedure: Thoracic Ten - Lumbar Five revison of hardware with Mazor;  Surgeon: Danielle Dess,  Sherilyn Cooter, MD;  Location: [facility] OR;  Service: Neurosurgery;  Laterality: N/A;  Thoracic/Lumbar   PTOSIS REPAIR Bilateral 07/27/2015   Procedure: PTOSIS REPAIR;  Surgeon: Louisa Second, MD;  Location: [facility];  Service: Plastics;  Laterality: Bilateral;   SHOULDER SURGERY     rt rcr,and lt   TENOLYSIS Left 05/21/2021   Procedure: RELEASE EXTENSOR POLLICIS LONGUS LEFT;  Surgeon: Cindee Salt, MD;  Location: [facility];  Service: Orthopedics;  Laterality: Left;   TENOLYSIS Left 06/08/2021    Procedure: RELEASE EXTENSOR POLLICIS LONGUS LEFT;  Surgeon: Cindee Salt, MD;  Location: [facility];  Service: Orthopedics;  Laterality: Left;   TONSILLECTOMY     TOTAL KNEE ARTHROPLASTY Right 12/23/2019   Procedure: TOTAL KNEE ARTHROPLASTY;  Surgeon: Dannielle Huh, MD;  Location: [facility];  Service: Orthopedics;  Laterality: Right;   TRIGGER FINGER RELEASE Left 05/21/2021   Procedure: RELEASE TRIGGER FINGER/A-1 PULLEY LEFT MIDDLE FINGER;  Surgeon: Cindee Salt, MD;  Location: [facility];  Service: Orthopedics;  Laterality: Left;   TRIGGER FINGER RELEASE Left 06/08/2021   Procedure: RELEASE A-1 PULLEY LEFT MIDDLE FINGER;  Surgeon: Cindee Salt, MD;  Location: [facility];  Service: Orthopedics;  Laterality: Left;   Patient Active Problem List   Diagnosis Date Noted   Myelopathy concurrent with and due to spinal stenosis of thoracic region (HCC) 10/27/2020   S/P total knee arthroplasty, right 12/23/2019   Acalculous cholecystitis 08/24/2018   Osteoarthritis of right knee    Vertigo    DDD (degenerative disc disease), cervical    Benign essential HTN    History of DVT (deep vein thrombosis)    Tachycardia    Steroid-induced hyperglycemia    Hypothyroidism    Neuropathic pain    Acute blood loss anemia    S/P lumbar fusion    Closed L5 vertebral fracture (HCC) 08/31/2017   Closed fracture of fifth lumbar vertebra (HCC) 08/30/2017   Lumbar vertebral fracture (HCC) 07/17/2017   Scoliosis 06/13/2017    PCP: Burton Apley, MD  REFERRING PROVIDER: Sherlean Foot, MD  REFERRING DIAG: S/P left TKA  THERAPY DIAG:  Localized edema  Stiffness of left knee, not elsewhere classified  Difficulty in walking, not elsewhere classified  Muscle weakness (generalized)  Acute pain of left knee  Other low back pain  Rationale for Evaluation and Treatment: Rehabilitation  ONSET DATE: 10/17/22  SUBJECTIVE:   SUBJECTIVE STATEMENT: Patient with c/o  significant right hip pain today, she reports that she has had some pain for a number of months but is c/o more pain today and reports that it is significant  PERTINENT HISTORY: See above PAIN:  Are you having pain? Yes: NPRS scale: 7-8/10 Pain location: right hip Pain description: ache,sore deep Aggravating factors: unsure why the increased pain Relieving factors: massage helps, tylenol  PRECAUTIONS: None  RED FLAGS: None   WEIGHT BEARING RESTRICTIONS: No  FALLS:  Has patient fallen in last 6 months? No  LIVING ENVIRONMENT: Lives with: lives with their family and lives with their spouse Lives in: House/apartment Stairs: Yes: Internal: 12 steps; can reach both Has following equipment at home: Walker - 4 wheeled, Shower bench, and bed side commode  OCCUPATION: retired  PLOF: Independent and was using a SPC, she was doing some cooking and cleaning  PATIENT GOALS: have good ROM, less pain, walk better, go up and down stairs  NEXT MD VISIT: 11/30/22  OBJECTIVE:   DIAGNOSTIC FINDINGS: s/p left TKA  PATIENT  SURVEYS:  FOTO 6.5  COGNITION: Overall cognitive status: Within functional limits for tasks assessed     SENSATION: WFL Numbness laterally  EDEMA:  Circumferential: right mid patella 46 cm, left 55 cm  PALPATION: Very swollen in the knee, the shin and the foot, very tender to palpation in the thigh, knee and shin, very sensitive where the tourniquet was  LOWER EXTREMITY ROM:  Active ROM Left AROM eval Left PROM eval Left AROM  Sitting 11/17/22 L knee AROM 12/08/22  Hip flexion      Hip extension      Hip abduction      Hip adduction      Hip internal rotation      Hip external rotation      Knee flexion 80 85 120 125  Knee extension 27 20 5 8   Ankle dorsiflexion      Ankle plantarflexion      Ankle inversion      Ankle eversion       (Blank rows = not tested)  LOWER EXTREMITY MMT:  MMT Right eval Left eval  Hip flexion    Hip extension    Hip  abduction    Hip adduction    Hip internal rotation    Hip external rotation    Knee flexion  3-  Knee extension  2  Ankle dorsiflexion    Ankle plantarflexion    Ankle inversion    Ankle eversion     (Blank rows = not tested) FUNCTIONAL TESTS:  Timed up and go (TUG): 51 seconds, 12/29/22 = 17 seconds  GAIT: Distance walked: 50 feet Assistive device utilized: Environmental consultant - 2 wheeled Level of assistance: SBA Comments: slow and guarded, small steps   TODAY'S TREATMENT:                                                                                                                              DATE:   01/10/23 Nustep level 5 x 6 minutes Passive stretch to the right hip SAQ 3# Clamshells red tband STM to the right glutes, using the t-gun  01/05/23 Nustep level 5 x 6 minutes LE only  Leg curls 25# 2x12, LLE 15lb 2x10 Leg extension 5# 2x10 LLE 10lb eccentrics 2x5 Gait with SPC CGA 200 feet x 2, occasional LOB with distraction and will use the walls for stability Gait 129ft first 75 w/ SPC second 75 HHA x1 Green tband clamshells in sitting Hip Add ball squeeze 2x10 Alt 6in box taps 3lb cuff 2x10 Step up one rail 4inx10, 6 in x5 each  01/03/23 Nustep level 5 x 6 minutes Leg extension 5# 2x10 Leg curls 25# 2x10 Seated ball b/n knees squeeze Gait with SPC CGA 200 feet x 2, occasional LOB with distraction and will use the walls for stability Green tband clamshells in sitting Red tband ankle exercises SAQ 3# Sidelying clamshells Sidelying right hip STM with the theragun  12/29/22 Nustep level 5 x  6 minutes Leg curls 25# 3x10 5# leg extension 3x10 PROM left knee, scar and patellar mobs Ball b/n knee squeeze Green tband clamshells Standing 2# hip abduction, flexion and extension Gait in clinic with SPC stairs step over step up and down, 3x100' STM to the right glute and ITB  12/27/22 Nustep level level 5 x 6 minutes Gait outside negotiating curbs with SPC and CGA/HHA Leg  curls 25# 2x12 5# leg extension 2x12 SAQ with cues for TKE Ball squeeze Red tband clamshells Gait with SPC x200 feet 2.5# marches, hip abduction, extension Red tband ankle exercises  12/22/22 Nustep level 5 x 6 mintues 25# HS curls 3x10 5# leg extension 2x10 Supine feet on ball K2C, rotation, small bridge, isometric abs Ball b/n knees squeeze Green tband clamshells Gait with SPC 2x200 feet SBA at times uses the walls Vaso Medium pressure in elevation left knee  12/20/22 Nustep level 5 x 7 minutes 25# HS curls 3x10 5# leg extension 2x10 Gait with SPC and CGA x 220 feet Red tband ankle exercises LEg press 20# 3x10 Standing hip abduction, marching and extension 3# On airex balance beam side stepping with use of the Pbars Gait with SPC x 200 feet CGA  12/15/22 Nustep level 5 x 6 minutes Leg curls 25# 2x10, left only 15# 2x10 Leg extension 10# 2x10, left only 5# 2x10 with ankle weight, could not do machine Gait outside with walker CGA for curbs 1/2 of parking [employer] 2.5# marches and hip abduction Leg press 20# 2x10 Bridges Vaso left knee medium pressure   12/13/22 NuStep L only L 5 x 6 min Leg press 20lb 2x10 HS curls 35lb 2x10, LLE 15lb x10 Leg Ext 10lb 2x10, LLE 5lb eccentric x10 S2S holding red ball 2x0 with progressing lowering Alt 2in box taps HHA x 1 2x5 each Gati 47ft w/ SPC   12/08/22 NuStep L only L 5 x 6 min Checked Goals  ROM  TUG  FOTO  Stairs 4in two rails, cues for alternating pattern,12 steps,  Gait w/ SPC 47ft x 2 In RW hip abd & marching 2lb cuff 2x10  2lb cuff alt 4in box taps 2x10 LLE LAQ 2lb 2x10 Vaso 10 mins high pressure 36 degrees F in elevation    PATIENT EDUCATION:  Education details: HEP/POC Person educated: Patient Education method: Programmer, multimedia, Facilities manager, Verbal cues, and Handouts Education comprehension: verbalized understanding  HOME EXERCISE PROGRAM: Access Code: [email or web address] URL: [email or web address] Date:  10/27/2022 Prepared by: Stacie Glaze  Exercises - Seated March  - 2 x daily - 7 x weekly - 1 sets - 10 reps - 3 hold - Seated Long Arc Quad  - 2 x daily - 7 x weekly - 1 sets - 10 reps - 3 hold - Seated Heel Raise  - 2 x daily - 7 x weekly - 1 sets - 10 reps - 3 hold - Seated Knee Flexion Stretch  - 1 x daily - 7 x weekly - 1 sets - 10 reps - 15 hold  ASSESSMENT:  CLINICAL IMPRESSION: Patient again with c/o significant right hip and buttock pain, she is very tender in the buttocks.  She is reporting pain when walking, she is unsure of a cause for the recent exacerbation.  She is taking tylenol and has called her back Careers adviser.  OBJECTIVE IMPAIRMENTS: Abnormal gait, cardiopulmonary status limiting activity, decreased activity tolerance, decreased balance, decreased endurance, decreased mobility, difficulty walking, decreased ROM, decreased strength, increased edema, impaired flexibility, improper body mechanics, and pain.  REHAB POTENTIAL: Good  CLINICAL DECISION MAKING: Stable/uncomplicated  EVALUATION COMPLEXITY: Low   GOALS: Goals reviewed with patient? Yes  SHORT TERM GOALS: Target date: 11/11/22 Independent with initial HEP Goal status: met 11/08/22  LONG TERM GOALS: Target date: 01/27/23  Independent with advanced HEP Goal status: progressing 12/29/22  2.  Decrease pain 50% Goal status: ongoing 01/10/23 exacerbation of right hip pain  3.  Increase AROM to 10-110 degrees flexion Goal status: 11/17/22 MET 5-120  4.  Go up and down stairs safely step over step with hand rails Goal status: Progressing 12/29/22  5.  Decrease swelling by 4cm Goal status: progressing 12/29/22  6.  Decrease TUG to 24 seconds Goal status: Met  17.57 12/08/22   PLAN:  PT FREQUENCY: 1-2x/week  PT DURATION: 12 weeks  PLANNED INTERVENTIONS: Therapeutic exercises, Therapeutic activity, Neuromuscular re-education, Balance training, Gait training, Patient/Family education, Self Care, Joint  mobilization, Joint manipulation, Stair training, Electrical stimulation, Vasopneumatic device, and Manual therapy  PLAN FOR NEXT SESSION:  see if the right hip is having less pain and progress if tolerated   Jearld Lesch, PT

## 2023-01-12 ENCOUNTER — Encounter: Payer: Self-pay | Admitting: Physical Therapy

## 2023-01-12 ENCOUNTER — Ambulatory Visit: Payer: Medicare Other | Admitting: Physical Therapy

## 2023-01-12 DIAGNOSIS — R262 Difficulty in walking, not elsewhere classified: Secondary | ICD-10-CM

## 2023-01-12 DIAGNOSIS — M5459 Other low back pain: Secondary | ICD-10-CM

## 2023-01-12 DIAGNOSIS — M25562 Pain in left knee: Secondary | ICD-10-CM

## 2023-01-12 DIAGNOSIS — M25662 Stiffness of left knee, not elsewhere classified: Secondary | ICD-10-CM

## 2023-01-12 DIAGNOSIS — M6281 Muscle weakness (generalized): Secondary | ICD-10-CM

## 2023-01-12 DIAGNOSIS — R600 Localized edema: Secondary | ICD-10-CM

## 2023-01-12 NOTE — Therapy (Signed)
OUTPATIENT PHYSICAL THERAPY LOWER EXTREMITY TREATMENT Progress Note Reporting Period 12/13/22 to 01/12/23 for visits 11-20  See note below for Objective Data and Assessment of Progress/Goals.       Patient Name: [name] MRN: [id number] DOB:1932/07/20, 89 y.o., female Today's Date: 01/12/2023  END OF SESSION:  PT End of Session - 01/12/23 1700     Visit Number 20    Date for PT Re-Evaluation 01/27/23    Authorization Type [employer] Medicare    PT Start Time 1650    PT Stop Time 1735    PT Time Calculation (min) 45 min    Activity Tolerance Patient tolerated treatment well    Behavior During Therapy [facility] for tasks assessed/performed              Past Medical History:  Diagnosis Date   Acalculous cholecystitis 08/24/2018   Arthritis    DVT of lower extremity (deep venous thrombosis) (HCC)    right leg - after back surgery in 2019   GERD (gastroesophageal reflux disease)    History of blood transfusion    Hypothyroidism    PONV (postoperative nausea and vomiting)    Ptosis of both eyelids    Vertigo    Wears glasses    reading   Past Surgical History:  Procedure Laterality Date   ABDOMINAL HYSTERECTOMY  1986   APPLICATION OF ROBOTIC ASSISTANCE FOR SPINAL PROCEDURE N/A 07/17/2017   Procedure: APPLICATION OF ROBOTIC ASSISTANCE FOR SPINAL PROCEDURE;  Surgeon: Barnett Abu, MD;  Location: [facility] OR;  Service: Neurosurgery;  Laterality: N/A;   APPLICATION OF ROBOTIC ASSISTANCE FOR SPINAL PROCEDURE N/A 09/01/2017   Procedure: APPLICATION OF ROBOTIC ASSISTANCE FOR SPINAL PROCEDURE;  Surgeon: Barnett Abu, MD;  Location: [facility] OR;  Service: Neurosurgery;  Laterality: N/A;   APPLICATION OF ROBOTIC ASSISTANCE FOR SPINAL PROCEDURE N/A 10/27/2020   Procedure: APPLICATION OF ROBOTIC ASSISTANCE FOR SPINAL PROCEDURE;  Surgeon: Barnett Abu, MD;  Location: [facility] OR;  Service: Neurosurgery;  Laterality: N/A;   BACK SURGERY  1986   lumb lam   BREAST BIOPSY Right 2011   CARPAL TUNNEL  RELEASE  2000   right   CARPAL TUNNEL RELEASE  2012   left   CHOLECYSTECTOMY N/A 08/24/2018   Procedure: LAPAROSCOPIC CHOLECYSTECTOMY WITH INTRAOPERATIVE CHOLANGIOGRAM;  Surgeon: Claud Kelp, MD;  Location: [facility] OR;  Service: General;  Laterality: N/A;   COLONOSCOPY     CYSTOCELE REPAIR  2007   sling   DISTAL INTERPHALANGEAL JOINT FUSION Right 02/04/2014   Procedure: FUSION DISTAL INTERPHALANGEAL JOINT RIGHT INDEX FINGER ;  Surgeon: Cindee Salt, MD;  Location: [facility];  Service: Orthopedics;  Laterality: Right;   EYE SURGERY Bilateral    Cataract surgery with lens implant   KNEE SURGERY  2009,2011   partial knee   LAPAROSCOPY N/A 08/24/2018   Procedure: LAPAROSCOPY DIAGNOSTIC;  Surgeon: Claud Kelp, MD;  Location: [facility] OR;  Service: General;  Laterality: N/A;   LUMBAR PERCUTANEOUS PEDICLE SCREW 4 LEVEL N/A 10/27/2020   Procedure: Thoracic nine-ten Laminectomy with extension of fusion from Thoracic ten to Thoracic six with segmental fixation (cemented) with pedicle screw and mazor;  Surgeon: Barnett Abu, MD;  Location: [facility] OR;  Service: Neurosurgery;  Laterality: N/A;   OPEN REDUCTION INTERNAL FIXATION (ORIF) PROXIMAL PHALANX Left 08/30/2013   Procedure: OPEN REDUCTION INTERNAL FIXATION (ORIF) PROXIMAL PHALANX FRACTURE LEFT SMALL FINGER; SPLINT RING FINGER;  Surgeon: Nicki Reaper, MD;  Location: [facility];  Service: Orthopedics;  Laterality: Left;  POSTERIOR LUMBAR FUSION 4 LEVEL N/A 07/17/2017   Procedure: Thoracic Ten - Lumbar Five revison of hardware with Mazor;  Surgeon: Barnett Abu, MD;  Location: [facility] OR;  Service: Neurosurgery;  Laterality: N/A;  Thoracic/Lumbar   PTOSIS REPAIR Bilateral 07/27/2015   Procedure: PTOSIS REPAIR;  Surgeon: Louisa Second, MD;  Location: [facility];  Service: Plastics;  Laterality: Bilateral;   SHOULDER SURGERY     rt rcr,and lt   TENOLYSIS Left 05/21/2021   Procedure: RELEASE EXTENSOR POLLICIS  LONGUS LEFT;  Surgeon: Cindee Salt, MD;  Location: [facility];  Service: Orthopedics;  Laterality: Left;   TENOLYSIS Left 06/08/2021   Procedure: RELEASE EXTENSOR POLLICIS LONGUS LEFT;  Surgeon: Cindee Salt, MD;  Location: [facility];  Service: Orthopedics;  Laterality: Left;   TONSILLECTOMY     TOTAL KNEE ARTHROPLASTY Right 12/23/2019   Procedure: TOTAL KNEE ARTHROPLASTY;  Surgeon: Dannielle Huh, MD;  Location: [facility];  Service: Orthopedics;  Laterality: Right;   TRIGGER FINGER RELEASE Left 05/21/2021   Procedure: RELEASE TRIGGER FINGER/A-1 PULLEY LEFT MIDDLE FINGER;  Surgeon: Cindee Salt, MD;  Location: [facility];  Service: Orthopedics;  Laterality: Left;   TRIGGER FINGER RELEASE Left 06/08/2021   Procedure: RELEASE A-1 PULLEY LEFT MIDDLE FINGER;  Surgeon: Cindee Salt, MD;  Location: [facility];  Service: Orthopedics;  Laterality: Left;   Patient Active Problem List   Diagnosis Date Noted   Myelopathy concurrent with and due to spinal stenosis of thoracic region (HCC) 10/27/2020   S/P total knee arthroplasty, right 12/23/2019   Acalculous cholecystitis 08/24/2018   Osteoarthritis of right knee    Vertigo    DDD (degenerative disc disease), cervical    Benign essential HTN    History of DVT (deep vein thrombosis)    Tachycardia    Steroid-induced hyperglycemia    Hypothyroidism    Neuropathic pain    Acute blood loss anemia    S/P lumbar fusion    Closed L5 vertebral fracture (HCC) 08/31/2017   Closed fracture of fifth lumbar vertebra (HCC) 08/30/2017   Lumbar vertebral fracture (HCC) 07/17/2017   Scoliosis 06/13/2017    PCP: Burton Apley, MD  REFERRING PROVIDER: Sherlean Foot, MD  REFERRING DIAG: S/P left TKA  THERAPY DIAG:  Localized edema  Stiffness of left knee, not elsewhere classified  Difficulty in walking, not elsewhere classified  Muscle weakness (generalized)  Acute pain of left knee  Other low back  pain  Rationale for Evaluation and Treatment: Rehabilitation  ONSET DATE: 10/17/22  SUBJECTIVE:   SUBJECTIVE STATEMENT: Patient saw Dr. Sherlean Foot yesterday, knee was looking good, she asked about the hip pain and the MD felt like it was bursitis  PERTINENT HISTORY: See above PAIN:  Are you having pain? Yes: NPRS scale: 7-8/10 Pain location: right hip Pain description: ache,sore deep Aggravating factors: unsure why the increased pain Relieving factors: massage helps, tylenol  PRECAUTIONS: None  RED FLAGS: None   WEIGHT BEARING RESTRICTIONS: No  FALLS:  Has patient fallen in last 6 months? No  LIVING ENVIRONMENT: Lives with: lives with their family and lives with their spouse Lives in: House/apartment Stairs: Yes: Internal: 12 steps; can reach both Has following equipment at home: Walker - 4 wheeled, Shower bench, and bed side commode  OCCUPATION: retired  PLOF: Independent and was using a SPC, she was doing some cooking and cleaning  PATIENT GOALS: have good ROM, less pain, walk better, go up and down stairs  NEXT MD  VISIT: 11/30/22  OBJECTIVE:   DIAGNOSTIC FINDINGS: s/p left TKA  PATIENT SURVEYS:  FOTO 6.5  COGNITION: Overall cognitive status: Within functional limits for tasks assessed     SENSATION: WFL Numbness laterally  EDEMA:  Circumferential: right mid patella 46 cm, left 55 cm  PALPATION: Very swollen in the knee, the shin and the foot, very tender to palpation in the thigh, knee and shin, very sensitive where the tourniquet was  LOWER EXTREMITY ROM:  Active ROM Left AROM eval Left PROM eval Left AROM  Sitting 11/17/22 L knee AROM 12/08/22  Hip flexion      Hip extension      Hip abduction      Hip adduction      Hip internal rotation      Hip external rotation      Knee flexion 80 85 120 125  Knee extension 27 20 5 8   Ankle dorsiflexion      Ankle plantarflexion      Ankle inversion      Ankle eversion       (Blank rows = not  tested)  LOWER EXTREMITY MMT:  MMT Right eval Left eval  Hip flexion    Hip extension    Hip abduction    Hip adduction    Hip internal rotation    Hip external rotation    Knee flexion  3-  Knee extension  2  Ankle dorsiflexion    Ankle plantarflexion    Ankle inversion    Ankle eversion     (Blank rows = not tested) FUNCTIONAL TESTS:  Timed up and go (TUG): 51 seconds, 12/29/22 = 17 seconds  GAIT: Distance walked: 50 feet Assistive device utilized: Environmental consultant - 2 wheeled Level of assistance: SBA Comments: slow and guarded, small steps   TODAY'S TREATMENT:                                                                                                                              DATE:   01/12/23 Nustep level 5 x 6 minutes Left knee green tband HS curls 3# left leg LAQ 3# marches Green tband [employer] b/n knees squeeze 20# leg curls  5# leg extension Passive hip stretches  01/10/23 Nustep level 5 x 6 minutes Passive stretch to the right hip SAQ 3# Clamshells red tband STM to the right glutes, using the t-gun  01/05/23 Nustep level 5 x 6 minutes LE only  Leg curls 25# 2x12, LLE 15lb 2x10 Leg extension 5# 2x10 LLE 10lb eccentrics 2x5 Gait with SPC CGA 200 feet x 2, occasional LOB with distraction and will use the walls for stability Gait 144ft first 75 w/ SPC second 75 HHA x1 Green tband clamshells in sitting Hip Add ball squeeze 2x10 Alt 6in box taps 3lb cuff 2x10 Step up one rail 4inx10, 6 in x5 each  01/03/23 Nustep level 5 x 6 minutes Leg extension 5# 2x10 Leg curls 25# 2x10  Seated ball b/n knees squeeze Gait with SPC CGA 200 feet x 2, occasional LOB with distraction and will use the walls for stability Green tband clamshells in sitting Red tband ankle exercises SAQ 3# Sidelying clamshells Sidelying right hip STM with the theragun  12/29/22 Nustep level 5 x 6 minutes Leg curls 25# 3x10 5# leg extension 3x10 PROM left knee, scar and patellar  mobs Ball b/n knee squeeze Green tband clamshells Standing 2# hip abduction, flexion and extension Gait in clinic with SPC stairs step over step up and down, 3x100' STM to the right glute and ITB  12/27/22 Nustep level level 5 x 6 minutes Gait outside negotiating curbs with SPC and CGA/HHA Leg curls 25# 2x12 5# leg extension 2x12 SAQ with cues for TKE Ball squeeze Red tband clamshells Gait with SPC x200 feet 2.5# marches, hip abduction, extension Red tband ankle exercises  12/22/22 Nustep level 5 x 6 mintues 25# HS curls 3x10 5# leg extension 2x10 Supine feet on ball K2C, rotation, small bridge, isometric abs Ball b/n knees squeeze Green tband clamshells Gait with SPC 2x200 feet SBA at times uses the walls Vaso Medium pressure in elevation left knee  12/20/22 Nustep level 5 x 7 minutes 25# HS curls 3x10 5# leg extension 2x10 Gait with SPC and CGA x 220 feet Red tband ankle exercises LEg press 20# 3x10 Standing hip abduction, marching and extension 3# On airex balance beam side stepping with use of the Pbars Gait with SPC x 200 feet CGA  12/15/22 Nustep level 5 x 6 minutes Leg curls 25# 2x10, left only 15# 2x10 Leg extension 10# 2x10, left only 5# 2x10 with ankle weight, could not do machine Gait outside with walker CGA for curbs 1/2 of parking [employer] 2.5# marches and hip abduction Leg press 20# 2x10 Bridges Vaso left knee medium pressure   12/13/22 NuStep L only L 5 x 6 min Leg press 20lb 2x10 HS curls 35lb 2x10, LLE 15lb x10 Leg Ext 10lb 2x10, LLE 5lb eccentric x10 S2S holding red ball 2x0 with progressing lowering Alt 2in box taps HHA x 1 2x5 each Gati 85ft w/ SPC   12/08/22 NuStep L only L 5 x 6 min Checked Goals  ROM  TUG  FOTO  Stairs 4in two rails, cues for alternating pattern,12 steps,  Gait w/ SPC 42ft x 2 In RW hip abd & marching 2lb cuff 2x10  2lb cuff alt 4in box taps 2x10 LLE LAQ 2lb 2x10 Vaso 10 mins high pressure 36 degrees F in  elevation    PATIENT EDUCATION:  Education details: HEP/POC Person educated: Patient Education method: Programmer, multimedia, Facilities manager, Verbal cues, and Handouts Education comprehension: verbalized understanding  HOME EXERCISE PROGRAM: Access Code: [email or web address] URL: [email or web address] Date: 10/27/2022 Prepared by: Stacie Glaze  Exercises - Seated March  - 2 x daily - 7 x weekly - 1 sets - 10 reps - 3 hold - Seated Long Arc Quad  - 2 x daily - 7 x weekly - 1 sets - 10 reps - 3 hold - Seated Heel Raise  - 2 x daily - 7 x weekly - 1 sets - 10 reps - 3 hold - Seated Knee Flexion Stretch  - 1 x daily - 7 x weekly - 1 sets - 10 reps - 15 hold  ASSESSMENT:  CLINICAL IMPRESSION: Patient again with c/o significant right hip and buttock pain, she is very tender in the buttocks.  MD feels it is bursitis, talked with her about using the ionto  on this, she sais she will think about it and see if that is something she wants to do  OBJECTIVE IMPAIRMENTS: Abnormal gait, cardiopulmonary status limiting activity, decreased activity tolerance, decreased balance, decreased endurance, decreased mobility, difficulty walking, decreased ROM, decreased strength, increased edema, impaired flexibility, improper body mechanics, and pain.   REHAB POTENTIAL: Good  CLINICAL DECISION MAKING: Stable/uncomplicated  EVALUATION COMPLEXITY: Low   GOALS: Goals reviewed with patient? Yes  SHORT TERM GOALS: Target date: 11/11/22 Independent with initial HEP Goal status: met 11/08/22  LONG TERM GOALS: Target date: 01/27/23  Independent with advanced HEP Goal status: progressing 12/29/22  2.  Decrease pain 50% Goal status: ongoing 01/10/23 exacerbation of right hip pain  3.  Increase AROM to 10-110 degrees flexion Goal status: 11/17/22 MET 5-120  4.  Go up and down stairs safely step over step with hand rails Goal status: Progressing 12/29/22  5.  Decrease swelling by 4cm Goal status: met  01/12/23  6.  Decrease TUG to 24 seconds Goal status: Met  17.57 12/08/22   PLAN:  PT FREQUENCY: 1-2x/week  PT DURATION: 12 weeks  PLANNED INTERVENTIONS: Therapeutic exercises, Therapeutic activity, Neuromuscular re-education, Balance training, Gait training, Patient/Family education, Self Care, Joint mobilization, Joint manipulation, Stair training, Electrical stimulation, Vasopneumatic device, and Manual therapy  PLAN FOR NEXT SESSION:  see if the right hip is having less pain and progress if tolerated   Jearld Lesch, PT

## 2023-01-17 ENCOUNTER — Ambulatory Visit: Payer: Medicare Other | Admitting: Physical Therapy

## 2023-01-17 ENCOUNTER — Encounter: Payer: Self-pay | Admitting: Physical Therapy

## 2023-01-17 DIAGNOSIS — M25562 Pain in left knee: Secondary | ICD-10-CM | POA: Diagnosis not present

## 2023-01-17 DIAGNOSIS — M6281 Muscle weakness (generalized): Secondary | ICD-10-CM

## 2023-01-17 DIAGNOSIS — R262 Difficulty in walking, not elsewhere classified: Secondary | ICD-10-CM

## 2023-01-17 DIAGNOSIS — M25662 Stiffness of left knee, not elsewhere classified: Secondary | ICD-10-CM

## 2023-01-17 DIAGNOSIS — R600 Localized edema: Secondary | ICD-10-CM

## 2023-01-17 NOTE — Therapy (Signed)
OUTPATIENT PHYSICAL THERAPY LOWER EXTREMITY TREATMENT    Patient Name: [name] MRN: [id number] DOB:12-05-1932, 89 y.o., female Today's Date: 01/17/2023  END OF SESSION:  PT End of Session - 01/17/23 1618     Visit Number 21    Date for PT Re-Evaluation 01/27/23    Authorization Type [employer] Medicare    PT Start Time 1605    PT Stop Time 1655    PT Time Calculation (min) 50 min    Activity Tolerance Patient tolerated treatment well    Behavior During Therapy WFL for tasks assessed/performed              Past Medical History:  Diagnosis Date   Acalculous cholecystitis 08/24/2018   Arthritis    DVT of lower extremity (deep venous thrombosis) (HCC)    right leg - after back surgery in 2019   GERD (gastroesophageal reflux disease)    History of blood transfusion    Hypothyroidism    PONV (postoperative nausea and vomiting)    Ptosis of both eyelids    Vertigo    Wears glasses    reading   Past Surgical History:  Procedure Laterality Date   ABDOMINAL HYSTERECTOMY  1986   APPLICATION OF ROBOTIC ASSISTANCE FOR SPINAL PROCEDURE N/A 07/17/2017   Procedure: APPLICATION OF ROBOTIC ASSISTANCE FOR SPINAL PROCEDURE;  Surgeon: Barnett Abu, MD;  Location: [facility] OR;  Service: Neurosurgery;  Laterality: N/A;   APPLICATION OF ROBOTIC ASSISTANCE FOR SPINAL PROCEDURE N/A 09/01/2017   Procedure: APPLICATION OF ROBOTIC ASSISTANCE FOR SPINAL PROCEDURE;  Surgeon: Barnett Abu, MD;  Location: [facility] OR;  Service: Neurosurgery;  Laterality: N/A;   APPLICATION OF ROBOTIC ASSISTANCE FOR SPINAL PROCEDURE N/A 10/27/2020   Procedure: APPLICATION OF ROBOTIC ASSISTANCE FOR SPINAL PROCEDURE;  Surgeon: Barnett Abu, MD;  Location: [facility] OR;  Service: Neurosurgery;  Laterality: N/A;   BACK SURGERY  1986   lumb lam   BREAST BIOPSY Right 2011   CARPAL TUNNEL RELEASE  2000   right   CARPAL TUNNEL RELEASE  2012   left   CHOLECYSTECTOMY N/A 08/24/2018   Procedure: LAPAROSCOPIC CHOLECYSTECTOMY WITH  INTRAOPERATIVE CHOLANGIOGRAM;  Surgeon: Claud Kelp, MD;  Location: [facility] OR;  Service: General;  Laterality: N/A;   COLONOSCOPY     CYSTOCELE REPAIR  2007   sling   DISTAL INTERPHALANGEAL JOINT FUSION Right 02/04/2014   Procedure: FUSION DISTAL INTERPHALANGEAL JOINT RIGHT INDEX FINGER ;  Surgeon: Cindee Salt, MD;  Location: [facility];  Service: Orthopedics;  Laterality: Right;   EYE SURGERY Bilateral    Cataract surgery with lens implant   KNEE SURGERY  2009,2011   partial knee   LAPAROSCOPY N/A 08/24/2018   Procedure: LAPAROSCOPY DIAGNOSTIC;  Surgeon: Claud Kelp, MD;  Location: [facility] OR;  Service: General;  Laterality: N/A;   LUMBAR PERCUTANEOUS PEDICLE SCREW 4 LEVEL N/A 10/27/2020   Procedure: Thoracic nine-ten Laminectomy with extension of fusion from Thoracic ten to Thoracic six with segmental fixation (cemented) with pedicle screw and mazor;  Surgeon: Barnett Abu, MD;  Location: [facility] OR;  Service: Neurosurgery;  Laterality: N/A;   OPEN REDUCTION INTERNAL FIXATION (ORIF) PROXIMAL PHALANX Left 08/30/2013   Procedure: OPEN REDUCTION INTERNAL FIXATION (ORIF) PROXIMAL PHALANX FRACTURE LEFT SMALL FINGER; SPLINT RING FINGER;  Surgeon: Nicki Reaper, MD;  Location: [facility];  Service: Orthopedics;  Laterality: Left;   POSTERIOR LUMBAR FUSION 4 LEVEL N/A 07/17/2017   Procedure: Thoracic Ten - Lumbar Five revison of hardware with Mazor;  Surgeon: Danielle Dess,  Sherilyn Cooter, MD;  Location: [facility] OR;  Service: Neurosurgery;  Laterality: N/A;  Thoracic/Lumbar   PTOSIS REPAIR Bilateral 07/27/2015   Procedure: PTOSIS REPAIR;  Surgeon: Louisa Second, MD;  Location: [facility];  Service: Plastics;  Laterality: Bilateral;   SHOULDER SURGERY     rt rcr,and lt   TENOLYSIS Left 05/21/2021   Procedure: RELEASE EXTENSOR POLLICIS LONGUS LEFT;  Surgeon: Cindee Salt, MD;  Location: [facility];  Service: Orthopedics;  Laterality: Left;   TENOLYSIS Left 06/08/2021    Procedure: RELEASE EXTENSOR POLLICIS LONGUS LEFT;  Surgeon: Cindee Salt, MD;  Location: [facility];  Service: Orthopedics;  Laterality: Left;   TONSILLECTOMY     TOTAL KNEE ARTHROPLASTY Right 12/23/2019   Procedure: TOTAL KNEE ARTHROPLASTY;  Surgeon: Dannielle Huh, MD;  Location: [facility];  Service: Orthopedics;  Laterality: Right;   TRIGGER FINGER RELEASE Left 05/21/2021   Procedure: RELEASE TRIGGER FINGER/A-1 PULLEY LEFT MIDDLE FINGER;  Surgeon: Cindee Salt, MD;  Location: [facility];  Service: Orthopedics;  Laterality: Left;   TRIGGER FINGER RELEASE Left 06/08/2021   Procedure: RELEASE A-1 PULLEY LEFT MIDDLE FINGER;  Surgeon: Cindee Salt, MD;  Location: [facility];  Service: Orthopedics;  Laterality: Left;   Patient Active Problem List   Diagnosis Date Noted   Myelopathy concurrent with and due to spinal stenosis of thoracic region (HCC) 10/27/2020   S/P total knee arthroplasty, right 12/23/2019   Acalculous cholecystitis 08/24/2018   Osteoarthritis of right knee    Vertigo    DDD (degenerative disc disease), cervical    Benign essential HTN    History of DVT (deep vein thrombosis)    Tachycardia    Steroid-induced hyperglycemia    Hypothyroidism    Neuropathic pain    Acute blood loss anemia    S/P lumbar fusion    Closed L5 vertebral fracture (HCC) 08/31/2017   Closed fracture of fifth lumbar vertebra (HCC) 08/30/2017   Lumbar vertebral fracture (HCC) 07/17/2017   Scoliosis 06/13/2017    PCP: Burton Apley, MD  REFERRING PROVIDER: Sherlean Foot, MD  REFERRING DIAG: S/P left TKA  THERAPY DIAG:  Localized edema  Stiffness of left knee, not elsewhere classified  Difficulty in walking, not elsewhere classified  Muscle weakness (generalized)  Acute pain of left knee  Rationale for Evaluation and Treatment: Rehabilitation  ONSET DATE: 10/17/22  SUBJECTIVE:   SUBJECTIVE STATEMENT: Patient reports that she was in the kitchen on  Sunday and bent over to pick something up and fell onto her right side, now with rib pain, has a black eye  PERTINENT HISTORY: See above PAIN:  Are you having pain? Yes: NPRS scale: 5/10 Pain location: right hip Pain description: ache,sore deep Aggravating factors: unsure why the increased pain Relieving factors: massage helps, tylenol  PRECAUTIONS: None  RED FLAGS: None   WEIGHT BEARING RESTRICTIONS: No  FALLS:  Has patient fallen in last 6 months? No  LIVING ENVIRONMENT: Lives with: lives with their family and lives with their spouse Lives in: House/apartment Stairs: Yes: Internal: 12 steps; can reach both Has following equipment at home: Walker - 4 wheeled, Shower bench, and bed side commode  OCCUPATION: retired  PLOF: Independent and was using a SPC, she was doing some cooking and cleaning  PATIENT GOALS: have good ROM, less pain, walk better, go up and down stairs  NEXT MD VISIT: 11/30/22  OBJECTIVE:   DIAGNOSTIC FINDINGS: s/p left TKA  PATIENT SURVEYS:  FOTO 6.5  COGNITION: Overall  cognitive status: Within functional limits for tasks assessed     SENSATION: WFL Numbness laterally  EDEMA:  Circumferential: right mid patella 46 cm, left 55 cm  PALPATION: Very swollen in the knee, the shin and the foot, very tender to palpation in the thigh, knee and shin, very sensitive where the tourniquet was  LOWER EXTREMITY ROM:  Active ROM Left AROM eval Left PROM eval Left AROM  Sitting 11/17/22 L knee AROM 12/08/22  Hip flexion      Hip extension      Hip abduction      Hip adduction      Hip internal rotation      Hip external rotation      Knee flexion 80 85 120 125  Knee extension 27 20 5 8   Ankle dorsiflexion      Ankle plantarflexion      Ankle inversion      Ankle eversion       (Blank rows = not tested)  LOWER EXTREMITY MMT:  MMT Right eval Left eval  Hip flexion    Hip extension    Hip abduction    Hip adduction    Hip internal  rotation    Hip external rotation    Knee flexion  3-  Knee extension  2  Ankle dorsiflexion    Ankle plantarflexion    Ankle inversion    Ankle eversion     (Blank rows = not tested) FUNCTIONAL TESTS:  Timed up and go (TUG): 51 seconds, 12/29/22 = 17 seconds  GAIT: Distance walked: 50 feet Assistive device utilized: Environmental consultant - 2 wheeled Level of assistance: SBA Comments: slow and guarded, small steps   TODAY'S TREATMENT:                                                                                                                              DATE:   01/17/23 Nustep legs only level 5 x 6 minutes Leg curls 20# 2x10 Leg extension 5# 3 x10 Green tband clamshells Green tband ankle PF, red band DF 3# marches Passive hip stretches Active ankle exercises HHA side stepping  01/12/23 Nustep level 5 x 6 minutes Left knee green tband HS curls 3# left leg LAQ 3# marches Green tband [employer] b/n knees squeeze 20# leg curls  5# leg extension Passive hip stretches  01/10/23 Nustep level 5 x 6 minutes Passive stretch to the right hip SAQ 3# Clamshells red tband STM to the right glutes, using the t-gun  01/05/23 Nustep level 5 x 6 minutes LE only  Leg curls 25# 2x12, LLE 15lb 2x10 Leg extension 5# 2x10 LLE 10lb eccentrics 2x5 Gait with SPC CGA 200 feet x 2, occasional LOB with distraction and will use the walls for stability Gait 147ft first 75 w/ SPC second 75 HHA x1 Green tband clamshells in sitting Hip Add ball squeeze 2x10 Alt 6in box taps 3lb cuff 2x10 Step up one rail 4inx10,  6 in x5 each  01/03/23 Nustep level 5 x 6 minutes Leg extension 5# 2x10 Leg curls 25# 2x10 Seated ball b/n knees squeeze Gait with SPC CGA 200 feet x 2, occasional LOB with distraction and will use the walls for stability Green tband clamshells in sitting Red tband ankle exercises SAQ 3# Sidelying clamshells Sidelying right hip STM with the theragun  12/29/22 Nustep level 5 x 6  minutes Leg curls 25# 3x10 5# leg extension 3x10 PROM left knee, scar and patellar mobs Ball b/n knee squeeze Green tband clamshells Standing 2# hip abduction, flexion and extension Gait in clinic with SPC stairs step over step up and down, 3x100' STM to the right glute and ITB  12/27/22 Nustep level level 5 x 6 minutes Gait outside negotiating curbs with SPC and CGA/HHA Leg curls 25# 2x12 5# leg extension 2x12 SAQ with cues for TKE Ball squeeze Red tband clamshells Gait with SPC x200 feet 2.5# marches, hip abduction, extension Red tband ankle exercises  12/22/22 Nustep level 5 x 6 mintues 25# HS curls 3x10 5# leg extension 2x10 Supine feet on ball K2C, rotation, small bridge, isometric abs Ball b/n knees squeeze Green tband clamshells Gait with SPC 2x200 feet SBA at times uses the walls Vaso Medium pressure in elevation left knee  12/20/22 Nustep level 5 x 7 minutes 25# HS curls 3x10 5# leg extension 2x10 Gait with SPC and CGA x 220 feet Red tband ankle exercises LEg press 20# 3x10 Standing hip abduction, marching and extension 3# On airex balance beam side stepping with use of the Pbars Gait with SPC x 200 feet CGA  12/15/22 Nustep level 5 x 6 minutes Leg curls 25# 2x10, left only 15# 2x10 Leg extension 10# 2x10, left only 5# 2x10 with ankle weight, could not do machine Gait outside with walker CGA for curbs 1/2 of parking [employer] 2.5# marches and hip abduction Leg press 20# 2x10 Bridges Vaso left knee medium pressure   PATIENT EDUCATION:  Education details: HEP/POC Person educated: Patient Education method: Programmer, multimedia, Facilities manager, Verbal cues, and Handouts Education comprehension: verbalized understanding  HOME EXERCISE PROGRAM: Access Code: [email or web address] URL: [email or web address] Date: 10/27/2022 Prepared by: Stacie Glaze  Exercises - Seated March  - 2 x daily - 7 x weekly - 1 sets - 10 reps - 3 hold - Seated Long Arc Quad  - 2 x  daily - 7 x weekly - 1 sets - 10 reps - 3 hold - Seated Heel Raise  - 2 x daily - 7 x weekly - 1 sets - 10 reps - 3 hold - Seated Knee Flexion Stretch  - 1 x daily - 7 x weekly - 1 sets - 10 reps - 15 hold  ASSESSMENT:  CLINICAL IMPRESSION: Patient had a fall on Sunday, she has a black eye, and very sore right ribs, we did not do as much walking today, we tried to continue some activities but tried to go easier to help with her rib pain.  We may need to look at her balance when she feels better  OBJECTIVE IMPAIRMENTS: Abnormal gait, cardiopulmonary status limiting activity, decreased activity tolerance, decreased balance, decreased endurance, decreased mobility, difficulty walking, decreased ROM, decreased strength, increased edema, impaired flexibility, improper body mechanics, and pain.   REHAB POTENTIAL: Good  CLINICAL DECISION MAKING: Stable/uncomplicated  EVALUATION COMPLEXITY: Low   GOALS: Goals reviewed with patient? Yes  SHORT TERM GOALS: Target date: 11/11/22 Independent with initial HEP Goal status: met 11/08/22  LONG TERM GOALS: Target  date: 01/27/23  Independent with advanced HEP Goal status: progressing 12/29/22  2.  Decrease pain 50% Goal status: ongoing 01/10/23 exacerbation of right hip pain  3.  Increase AROM to 10-110 degrees flexion Goal status: 11/17/22 MET 5-120  4.  Go up and down stairs safely step over step with hand rails Goal status: Progressing 12/29/22  5.  Decrease swelling by 4cm Goal status: met 01/12/23  6.  Decrease TUG to 24 seconds Goal status: Met  17.57 12/08/22   PLAN:  PT FREQUENCY: 1-2x/week  PT DURATION: 12 weeks  PLANNED INTERVENTIONS: Therapeutic exercises, Therapeutic activity, Neuromuscular re-education, Balance training, Gait training, Patient/Family education, Self Care, Joint mobilization, Joint manipulation, Stair training, Electrical stimulation, Vasopneumatic device, and Manual therapy  PLAN FOR NEXT SESSION:  see if the  right hip is having less pain and progress if tolerated, see if her ribs are feeling better   Jearld Lesch, PT

## 2023-01-19 ENCOUNTER — Ambulatory Visit: Payer: Medicare Other | Admitting: Physical Therapy

## 2023-01-24 ENCOUNTER — Ambulatory Visit: Payer: Medicare Other | Admitting: Physical Therapy

## 2023-01-26 ENCOUNTER — Ambulatory Visit: Payer: Medicare Other | Admitting: Physical Therapy

## 2023-01-26 ENCOUNTER — Encounter: Payer: Self-pay | Admitting: Physical Therapy

## 2023-01-26 DIAGNOSIS — M25662 Stiffness of left knee, not elsewhere classified: Secondary | ICD-10-CM

## 2023-01-26 DIAGNOSIS — M6281 Muscle weakness (generalized): Secondary | ICD-10-CM

## 2023-01-26 DIAGNOSIS — M25562 Pain in left knee: Secondary | ICD-10-CM

## 2023-01-26 DIAGNOSIS — R600 Localized edema: Secondary | ICD-10-CM

## 2023-01-26 DIAGNOSIS — R262 Difficulty in walking, not elsewhere classified: Secondary | ICD-10-CM

## 2023-01-26 NOTE — Therapy (Signed)
OUTPATIENT PHYSICAL THERAPY LOWER EXTREMITY TREATMENT    Patient Name: ANJELICIA KNABB MRN: [id number] DOB:1932-12-13, 89 y.o., female Today's Date: 01/26/2023  END OF SESSION:  PT End of Session - 01/26/23 1607     Visit Number 22    Date for PT Re-Evaluation 02/01/23    Authorization Type [employer] Medicare  5/8    PT Start Time 1608    PT Stop Time 1656    PT Time Calculation (min) 48 min    Activity Tolerance Patient tolerated treatment well    Behavior During Therapy [facility] for tasks assessed/performed              Past Medical History:  Diagnosis Date   Acalculous cholecystitis 08/24/2018   Arthritis    DVT of lower extremity (deep venous thrombosis) (HCC)    right leg - after back surgery in 2019   GERD (gastroesophageal reflux disease)    History of blood transfusion    Hypothyroidism    PONV (postoperative nausea and vomiting)    Ptosis of both eyelids    Vertigo    Wears glasses    reading   Past Surgical History:  Procedure Laterality Date   ABDOMINAL HYSTERECTOMY  1986   APPLICATION OF ROBOTIC ASSISTANCE FOR SPINAL PROCEDURE N/A 07/17/2017   Procedure: APPLICATION OF ROBOTIC ASSISTANCE FOR SPINAL PROCEDURE;  Surgeon: Barnett Abu, MD;  Location: [facility] OR;  Service: Neurosurgery;  Laterality: N/A;   APPLICATION OF ROBOTIC ASSISTANCE FOR SPINAL PROCEDURE N/A 09/01/2017   Procedure: APPLICATION OF ROBOTIC ASSISTANCE FOR SPINAL PROCEDURE;  Surgeon: Barnett Abu, MD;  Location: [facility] OR;  Service: Neurosurgery;  Laterality: N/A;   APPLICATION OF ROBOTIC ASSISTANCE FOR SPINAL PROCEDURE N/A 10/27/2020   Procedure: APPLICATION OF ROBOTIC ASSISTANCE FOR SPINAL PROCEDURE;  Surgeon: Barnett Abu, MD;  Location: [facility] OR;  Service: Neurosurgery;  Laterality: N/A;   BACK SURGERY  1986   lumb lam   BREAST BIOPSY Right 2011   CARPAL TUNNEL RELEASE  2000   right   CARPAL TUNNEL RELEASE  2012   left   CHOLECYSTECTOMY N/A 08/24/2018   Procedure: LAPAROSCOPIC CHOLECYSTECTOMY  WITH INTRAOPERATIVE CHOLANGIOGRAM;  Surgeon: Claud Kelp, MD;  Location: [facility] OR;  Service: General;  Laterality: N/A;   COLONOSCOPY     CYSTOCELE REPAIR  2007   sling   DISTAL INTERPHALANGEAL JOINT FUSION Right 02/04/2014   Procedure: FUSION DISTAL INTERPHALANGEAL JOINT RIGHT INDEX FINGER ;  Surgeon: Cindee Salt, MD;  Location: [facility];  Service: Orthopedics;  Laterality: Right;   EYE SURGERY Bilateral    Cataract surgery with lens implant   KNEE SURGERY  2009,2011   partial knee   LAPAROSCOPY N/A 08/24/2018   Procedure: LAPAROSCOPY DIAGNOSTIC;  Surgeon: Claud Kelp, MD;  Location: [facility] OR;  Service: General;  Laterality: N/A;   LUMBAR PERCUTANEOUS PEDICLE SCREW 4 LEVEL N/A 10/27/2020   Procedure: Thoracic nine-ten Laminectomy with extension of fusion from Thoracic ten to Thoracic six with segmental fixation (cemented) with pedicle screw and mazor;  Surgeon: Barnett Abu, MD;  Location: [facility] OR;  Service: Neurosurgery;  Laterality: N/A;   OPEN REDUCTION INTERNAL FIXATION (ORIF) PROXIMAL PHALANX Left 08/30/2013   Procedure: OPEN REDUCTION INTERNAL FIXATION (ORIF) PROXIMAL PHALANX FRACTURE LEFT SMALL FINGER; SPLINT RING FINGER;  Surgeon: Nicki Reaper, MD;  Location: [facility];  Service: Orthopedics;  Laterality: Left;   POSTERIOR LUMBAR FUSION 4 LEVEL N/A 07/17/2017   Procedure: Thoracic Ten - Lumbar Five revison of hardware with Mazor;  Surgeon: Barnett Abu, MD;  Location: [facility] OR;  Service: Neurosurgery;  Laterality: N/A;  Thoracic/Lumbar   PTOSIS REPAIR Bilateral 07/27/2015   Procedure: PTOSIS REPAIR;  Surgeon: Louisa Second, MD;  Location: [facility];  Service: Plastics;  Laterality: Bilateral;   SHOULDER SURGERY     rt rcr,and lt   TENOLYSIS Left 05/21/2021   Procedure: RELEASE EXTENSOR POLLICIS LONGUS LEFT;  Surgeon: Cindee Salt, MD;  Location: [facility];  Service: Orthopedics;  Laterality: Left;   TENOLYSIS Left  06/08/2021   Procedure: RELEASE EXTENSOR POLLICIS LONGUS LEFT;  Surgeon: Cindee Salt, MD;  Location: [facility];  Service: Orthopedics;  Laterality: Left;   TONSILLECTOMY     TOTAL KNEE ARTHROPLASTY Right 12/23/2019   Procedure: TOTAL KNEE ARTHROPLASTY;  Surgeon: Dannielle Huh, MD;  Location: [facility];  Service: Orthopedics;  Laterality: Right;   TRIGGER FINGER RELEASE Left 05/21/2021   Procedure: RELEASE TRIGGER FINGER/A-1 PULLEY LEFT MIDDLE FINGER;  Surgeon: Cindee Salt, MD;  Location: [facility];  Service: Orthopedics;  Laterality: Left;   TRIGGER FINGER RELEASE Left 06/08/2021   Procedure: RELEASE A-1 PULLEY LEFT MIDDLE FINGER;  Surgeon: Cindee Salt, MD;  Location: [facility];  Service: Orthopedics;  Laterality: Left;   Patient Active Problem List   Diagnosis Date Noted   Myelopathy concurrent with and due to spinal stenosis of thoracic region (HCC) 10/27/2020   S/P total knee arthroplasty, right 12/23/2019   Acalculous cholecystitis 08/24/2018   Osteoarthritis of right knee    Vertigo    DDD (degenerative disc disease), cervical    Benign essential HTN    History of DVT (deep vein thrombosis)    Tachycardia    Steroid-induced hyperglycemia    Hypothyroidism    Neuropathic pain    Acute blood loss anemia    S/P lumbar fusion    Closed L5 vertebral fracture (HCC) 08/31/2017   Closed fracture of fifth lumbar vertebra (HCC) 08/30/2017   Lumbar vertebral fracture (HCC) 07/17/2017   Scoliosis 06/13/2017    PCP: Burton Apley, MD  REFERRING PROVIDER: Sherlean Foot, MD  REFERRING DIAG: S/P left TKA  THERAPY DIAG:  Localized edema  Stiffness of left knee, not elsewhere classified  Difficulty in walking, not elsewhere classified  Muscle weakness (generalized)  Acute pain of left knee  Rationale for Evaluation and Treatment: Rehabilitation  ONSET DATE: 10/17/22  SUBJECTIVE:   SUBJECTIVE STATEMENT: Patient had a fall last week, she is  still recovering from this, had an injection in the back last week, she is reporting that there is no real changes, she is saying she may have to go see if they can inject the hip  PERTINENT HISTORY: See above PAIN:  Are you having pain? Yes: NPRS scale: 5/10 Pain location: right hip Pain description: ache,sore deep Aggravating factors: unsure why the increased pain Relieving factors: massage helps, tylenol  PRECAUTIONS: None  RED FLAGS: None   WEIGHT BEARING RESTRICTIONS: No  FALLS:  Has patient fallen in last 6 months? No  LIVING ENVIRONMENT: Lives with: lives with their family and lives with their spouse Lives in: House/apartment Stairs: Yes: Internal: 12 steps; can reach both Has following equipment at home: Walker - 4 wheeled, Shower bench, and bed side commode  OCCUPATION: retired  PLOF: Independent and was using a SPC, she was doing some cooking and cleaning  PATIENT GOALS: have good ROM, less pain, walk better, go up and down stairs  NEXT MD VISIT: 11/30/22  OBJECTIVE:  DIAGNOSTIC FINDINGS: s/p left TKA  PATIENT SURVEYS:  FOTO 6.5  COGNITION: Overall cognitive status: Within functional limits for tasks assessed     SENSATION: WFL Numbness laterally  EDEMA:  Circumferential: right mid patella 46 cm, left 55 cm  PALPATION: Very swollen in the knee, the shin and the foot, very tender to palpation in the thigh, knee and shin, very sensitive where the tourniquet was  LOWER EXTREMITY ROM:  Active ROM Left AROM eval Left PROM eval Left AROM  Sitting 11/17/22 L knee AROM 12/08/22 Left knee AROM 01/26/23  Hip flexion       Hip extension       Hip abduction       Hip adduction       Hip internal rotation       Hip external rotation       Knee flexion 80 85 120 125 126  Knee extension 27 20 5 8 2   Ankle dorsiflexion       Ankle plantarflexion       Ankle inversion       Ankle eversion        (Blank rows = not tested)  LOWER EXTREMITY MMT:  MMT  Right eval Left eval Left  01/26/23  Hip flexion     Hip extension     Hip abduction     Hip adduction     Hip internal rotation     Hip external rotation     Knee flexion  3- 4-  Knee extension  2 3+  Ankle dorsiflexion     Ankle plantarflexion     Ankle inversion     Ankle eversion      (Blank rows = not tested) FUNCTIONAL TESTS:  Timed up and go (TUG): 51 seconds, 12/29/22 = 17 seconds, 01/26/23 18 seconds but is now limited by the right hip pain  GAIT: Distance walked: 50 feet Assistive device utilized: Walker - 2 wheeled Level of assistance: SBA Comments: slow and guarded, small steps   TODAY'S TREATMENT:                                                                                                                              DATE:   01/26/23 Nustep level 5 x 5 minutes MMT/ROM/TUG tests Step over step on 4" stairs, biggest issue is the right hip pain and weakness Gait with SPC and CGA/SBA in the clinic 110', then outside slopes and curbs with CGA and minA for curbs, also very unsure with thresholds LEg curls 20# 2x10, single legs 15# x10 each Leg extension both legs 5# x10, single legs 5# 2x5  Supine bridges SAQ 3# 3x10 Ball b/n knees squeeze Green tbnd clamshells  01/17/23 Nustep legs only level 5 x 6 minutes Leg curls 20# 2x10 Leg extension 5# 3 x10 Green tband clamshells Green tband ankle PF, red band DF 3# marches Passive hip stretches Active ankle exercises HHA side stepping  01/12/23  Nustep level 5 x 6 minutes Left knee green tband HS curls 3# left leg LAQ 3# marches Green tband [employer] b/n knees squeeze 20# leg curls  5# leg extension Passive hip stretches  01/10/23 Nustep level 5 x 6 minutes Passive stretch to the right hip SAQ 3# Clamshells red tband STM to the right glutes, using the t-gun  01/05/23 Nustep level 5 x 6 minutes LE only  Leg curls 25# 2x12, LLE 15lb 2x10 Leg extension 5# 2x10 LLE 10lb eccentrics 2x5 Gait  with SPC CGA 200 feet x 2, occasional LOB with distraction and will use the walls for stability Gait 172ft first 75 w/ SPC second 75 HHA x1 Green tband clamshells in sitting Hip Add ball squeeze 2x10 Alt 6in box taps 3lb cuff 2x10 Step up one rail 4inx10, 6 in x5 each  01/03/23 Nustep level 5 x 6 minutes Leg extension 5# 2x10 Leg curls 25# 2x10 Seated ball b/n knees squeeze Gait with SPC CGA 200 feet x 2, occasional LOB with distraction and will use the walls for stability Green tband clamshells in sitting Red tband ankle exercises SAQ 3# Sidelying clamshells Sidelying right hip STM with the theragun  12/29/22 Nustep level 5 x 6 minutes Leg curls 25# 3x10 5# leg extension 3x10 PROM left knee, scar and patellar mobs Ball b/n knee squeeze Green tband clamshells Standing 2# hip abduction, flexion and extension Gait in clinic with SPC stairs step over step up and down, 3x100' STM to the right glute and ITB  12/27/22 Nustep level level 5 x 6 minutes Gait outside negotiating curbs with SPC and CGA/HHA Leg curls 25# 2x12 5# leg extension 2x12 SAQ with cues for TKE Ball squeeze Red tband clamshells Gait with SPC x200 feet 2.5# marches, hip abduction, extension Red tband ankle exercises  12/22/22 Nustep level 5 x 6 mintues 25# HS curls 3x10 5# leg extension 2x10 Supine feet on ball K2C, rotation, small bridge, isometric abs Ball b/n knees squeeze Green tband clamshells Gait with SPC 2x200 feet SBA at times uses the walls Vaso Medium pressure in elevation left knee  12/20/22 Nustep level 5 x 7 minutes 25# HS curls 3x10 5# leg extension 2x10 Gait with SPC and CGA x 220 feet Red tband ankle exercises LEg press 20# 3x10 Standing hip abduction, marching and extension 3# On airex balance beam side stepping with use of the Pbars Gait with SPC x 200 feet CGA  12/15/22 Nustep level 5 x 6 minutes Leg curls 25# 2x10, left only 15# 2x10 Leg extension 10# 2x10, left only 5# 2x10  with ankle weight, could not do machine Gait outside with walker CGA for curbs 1/2 of parking [employer] 2.5# marches and hip abduction Leg press 20# 2x10 Bridges Vaso left knee medium pressure   PATIENT EDUCATION:  Education details: HEP/POC Person educated: Patient Education method: Programmer, multimedia, Facilities manager, Verbal cues, and Handouts Education comprehension: verbalized understanding  HOME EXERCISE PROGRAM: Access Code: [email or web address] URL: [email or web address] Date: 10/27/2022 Prepared by: Stacie Glaze  Exercises - Seated March  - 2 x daily - 7 x weekly - 1 sets - 10 reps - 3 hold - Seated Long Arc Quad  - 2 x daily - 7 x weekly - 1 sets - 10 reps - 3 hold - Seated Heel Raise  - 2 x daily - 7 x weekly - 1 sets - 10 reps - 3 hold - Seated Knee Flexion Stretch  - 1 x daily - 7 x weekly - 1 sets -  10 reps - 15 hold  ASSESSMENT:  CLINICAL IMPRESSION: Patient reports that she cancelled last week due to being sore, from the fall, she feels like her right hip is now the biggest issue.  She reports pain had an injection in the back last week but no relief, reports that she may call the MD about the hip and get an injection.  She has made improvements in strength and ROM, she is walking with the FWW, with a SPC she still is unsteady enough to need CGA and needs some times min A for the curb, she is very hesitant going through doorways    OBJECTIVE IMPAIRMENTS: Abnormal gait, cardiopulmonary status limiting activity, decreased activity tolerance, decreased balance, decreased endurance, decreased mobility, difficulty walking, decreased ROM, decreased strength, increased edema, impaired flexibility, improper body mechanics, and pain.   REHAB POTENTIAL: Good  CLINICAL DECISION MAKING: Stable/uncomplicated  EVALUATION COMPLEXITY: Low   GOALS: Goals reviewed with patient? Yes  SHORT TERM GOALS: Target date: 11/11/22 Independent with initial HEP Goal status: met 11/08/22  LONG  TERM GOALS: Target date: 01/27/23  Independent with advanced HEP Goal status: progressing 12/29/22  2.  Decrease pain 50% Goal status: ongoing 01/26/23 exacerbation of right hip pain  3.  Increase AROM to 10-110 degrees flexion Goal status: 11/17/22 MET 5-120  4.  Go up and down stairs safely step over step with hand rails Goal status: Progressing 01/26/23, still struggles with this  5.  Decrease swelling by 4cm Goal status: met 01/12/23  6.  Decrease TUG to 24 seconds Goal status: Met  17.57 12/08/22   PLAN:  PT FREQUENCY: 1-2x/week  PT DURATION: 12 weeks  PLANNED INTERVENTIONS: Therapeutic exercises, Therapeutic activity, Neuromuscular re-education, Balance training, Gait training, Patient/Family education, Self Care, Joint mobilization, Joint manipulation, Stair training, Electrical stimulation, Vasopneumatic device, and Manual therapy  PLAN FOR NEXT SESSION:  may need to look at renewal and ask for more visits, she is improving but hte hip is a hinderance to progressing   Jearld Lesch, PT

## 2023-01-31 ENCOUNTER — Ambulatory Visit: Payer: Medicare Other | Admitting: Physical Therapy

## 2023-01-31 DIAGNOSIS — M25562 Pain in left knee: Secondary | ICD-10-CM

## 2023-01-31 DIAGNOSIS — R262 Difficulty in walking, not elsewhere classified: Secondary | ICD-10-CM

## 2023-01-31 DIAGNOSIS — M5459 Other low back pain: Secondary | ICD-10-CM

## 2023-01-31 DIAGNOSIS — M6281 Muscle weakness (generalized): Secondary | ICD-10-CM

## 2023-01-31 DIAGNOSIS — M25662 Stiffness of left knee, not elsewhere classified: Secondary | ICD-10-CM

## 2023-01-31 NOTE — Therapy (Signed)
OUTPATIENT PHYSICAL THERAPY LOWER EXTREMITY TREATMENT    Patient Name: [name] MRN: [id number] DOB:17-Jan-1933, 89 y.o., female Today's Date: 01/31/2023  END OF SESSION:  PT End of Session - 01/31/23 1321     Visit Number 23    Date for PT Re-Evaluation 02/01/23    Authorization Type [employer] Medicare  5/8    PT Start Time 1315    PT Stop Time 1400    PT Time Calculation (min) 45 min              Past Medical History:  Diagnosis Date   Acalculous cholecystitis 08/24/2018   Arthritis    DVT of lower extremity (deep venous thrombosis) (HCC)    right leg - after back surgery in 2019   GERD (gastroesophageal reflux disease)    History of blood transfusion    Hypothyroidism    PONV (postoperative nausea and vomiting)    Ptosis of both eyelids    Vertigo    Wears glasses    reading   Past Surgical History:  Procedure Laterality Date   ABDOMINAL HYSTERECTOMY  1986   APPLICATION OF ROBOTIC ASSISTANCE FOR SPINAL PROCEDURE N/A 07/17/2017   Procedure: APPLICATION OF ROBOTIC ASSISTANCE FOR SPINAL PROCEDURE;  Surgeon: Barnett Abu, MD;  Location: [facility] OR;  Service: Neurosurgery;  Laterality: N/A;   APPLICATION OF ROBOTIC ASSISTANCE FOR SPINAL PROCEDURE N/A 09/01/2017   Procedure: APPLICATION OF ROBOTIC ASSISTANCE FOR SPINAL PROCEDURE;  Surgeon: Barnett Abu, MD;  Location: [facility] OR;  Service: Neurosurgery;  Laterality: N/A;   APPLICATION OF ROBOTIC ASSISTANCE FOR SPINAL PROCEDURE N/A 10/27/2020   Procedure: APPLICATION OF ROBOTIC ASSISTANCE FOR SPINAL PROCEDURE;  Surgeon: Barnett Abu, MD;  Location: [facility] OR;  Service: Neurosurgery;  Laterality: N/A;   BACK SURGERY  1986   lumb lam   BREAST BIOPSY Right 2011   CARPAL TUNNEL RELEASE  2000   right   CARPAL TUNNEL RELEASE  2012   left   CHOLECYSTECTOMY N/A 08/24/2018   Procedure: LAPAROSCOPIC CHOLECYSTECTOMY WITH INTRAOPERATIVE CHOLANGIOGRAM;  Surgeon: Claud Kelp, MD;  Location: [facility] OR;  Service: General;  Laterality:  N/A;   COLONOSCOPY     CYSTOCELE REPAIR  2007   sling   DISTAL INTERPHALANGEAL JOINT FUSION Right 02/04/2014   Procedure: FUSION DISTAL INTERPHALANGEAL JOINT RIGHT INDEX FINGER ;  Surgeon: Cindee Salt, MD;  Location: [facility];  Service: Orthopedics;  Laterality: Right;   EYE SURGERY Bilateral    Cataract surgery with lens implant   KNEE SURGERY  2009,2011   partial knee   LAPAROSCOPY N/A 08/24/2018   Procedure: LAPAROSCOPY DIAGNOSTIC;  Surgeon: Claud Kelp, MD;  Location: [facility] OR;  Service: General;  Laterality: N/A;   LUMBAR PERCUTANEOUS PEDICLE SCREW 4 LEVEL N/A 10/27/2020   Procedure: Thoracic nine-ten Laminectomy with extension of fusion from Thoracic ten to Thoracic six with segmental fixation (cemented) with pedicle screw and mazor;  Surgeon: Barnett Abu, MD;  Location: [facility] OR;  Service: Neurosurgery;  Laterality: N/A;   OPEN REDUCTION INTERNAL FIXATION (ORIF) PROXIMAL PHALANX Left 08/30/2013   Procedure: OPEN REDUCTION INTERNAL FIXATION (ORIF) PROXIMAL PHALANX FRACTURE LEFT SMALL FINGER; SPLINT RING FINGER;  Surgeon: Nicki Reaper, MD;  Location: [facility];  Service: Orthopedics;  Laterality: Left;   POSTERIOR LUMBAR FUSION 4 LEVEL N/A 07/17/2017   Procedure: Thoracic Ten - Lumbar Five revison of hardware with Mazor;  Surgeon: Barnett Abu, MD;  Location: [facility] OR;  Service: Neurosurgery;  Laterality: N/A;  Thoracic/Lumbar   PTOSIS  Seated Heel Raise  - 2 x daily - 7 x weekly - 1 sets - 10 reps - 3 hold - Seated Knee Flexion Stretch  - 1 x daily - 7 x weekly - 1 sets - 10 reps - 15 hold  ASSESSMENT:  CLINICAL IMPRESSION: Patient reports that she is getting [place] in hip Thursday from [place]. Tolerated there ex well for strengthening and gait. Still struggles with balance.  She had a fall about 3 weeks ago and really hurt her face and has some back and hip pain now, she is now having some fear of falling and hesitant.  When we walk with the [facility] she uses it on the left side and does okay still creeps with furniture and walls, if she uses the cane in the right hand she really has the right hip go out into a trendelenberg pattern that is significant and she is much more unsteady.  Her TUG time was worse after the fall after she had a nice improvement.  She also had an increase in knee strength.    OBJECTIVE IMPAIRMENTS: Abnormal gait, cardiopulmonary status limiting activity, decreased activity tolerance, decreased balance, decreased endurance, decreased mobility, difficulty walking, decreased ROM,  decreased strength, increased edema, impaired flexibility, improper body mechanics, and pain.   REHAB POTENTIAL: Good  CLINICAL DECISION MAKING: Stable/uncomplicated  EVALUATION COMPLEXITY: Low   GOALS: Goals reviewed with patient? Yes  SHORT TERM GOALS: Target date: 11/11/22 Independent with initial HEP Goal status: met 11/08/22  LONG TERM GOALS: Target date: 01/27/23  Independent with advanced HEP Goal status: progressing 12/29/22  2.  Decrease pain 50% Goal status: ongoing 01/26/23 exacerbation of right hip pain  3.  Increase AROM to 10-110 degrees flexion Goal status: 11/17/22 MET 5-120  4.  Go up and down stairs safely step over step with hand rails Goal status: Progressing 01/26/23, still struggles with this  5.  Decrease swelling by 4cm Goal status: met 01/12/23  6.  Decrease TUG to 24 seconds Goal status: Met  17.57 12/08/22   PLAN:  PT FREQUENCY: 1-2x/week  PT DURATION: 12 weeks  PLANNED INTERVENTIONS: Therapeutic exercises, Therapeutic activity, Neuromuscular re-education, Balance training, Gait training, Patient/Family education, Self Care, Joint mobilization, Joint manipulation, Stair training, Electrical stimulation, Vasopneumatic device, and Manual therapy  PLAN FOR NEXT SESSION:  may need to look at renewal and ask for more visits, she is improving but the hip is a hinderance to progressing and her having a recent fall has set her back due to fear and some pain   Joshawn Crissman,ANGIE, PTA  [facility]. [place], [place], [address] Phone: [phone]   Fax:  [phone]  Patient Details  Name: YSAMAR KONO MRN: [id number] Date of Birth: 1932-04-30 Referring Provider:  Dannielle Huh, MD  Encounter Date: 01/31/2023   Suanne Marker, PTA 01/31/2023, 1:21 PM  [place] [address] [place], [place], [address] Phone:  [phone]   Fax:  [phone]  OUTPATIENT PHYSICAL THERAPY LOWER EXTREMITY TREATMENT    Patient Name: [name] MRN: [id number] DOB:17-Jan-1933, 89 y.o., female Today's Date: 01/31/2023  END OF SESSION:  PT End of Session - 01/31/23 1321     Visit Number 23    Date for PT Re-Evaluation 02/01/23    Authorization Type [employer] Medicare  5/8    PT Start Time 1315    PT Stop Time 1400    PT Time Calculation (min) 45 min              Past Medical History:  Diagnosis Date   Acalculous cholecystitis 08/24/2018   Arthritis    DVT of lower extremity (deep venous thrombosis) (HCC)    right leg - after back surgery in 2019   GERD (gastroesophageal reflux disease)    History of blood transfusion    Hypothyroidism    PONV (postoperative nausea and vomiting)    Ptosis of both eyelids    Vertigo    Wears glasses    reading   Past Surgical History:  Procedure Laterality Date   ABDOMINAL HYSTERECTOMY  1986   APPLICATION OF ROBOTIC ASSISTANCE FOR SPINAL PROCEDURE N/A 07/17/2017   Procedure: APPLICATION OF ROBOTIC ASSISTANCE FOR SPINAL PROCEDURE;  Surgeon: Barnett Abu, MD;  Location: [facility] OR;  Service: Neurosurgery;  Laterality: N/A;   APPLICATION OF ROBOTIC ASSISTANCE FOR SPINAL PROCEDURE N/A 09/01/2017   Procedure: APPLICATION OF ROBOTIC ASSISTANCE FOR SPINAL PROCEDURE;  Surgeon: Barnett Abu, MD;  Location: [facility] OR;  Service: Neurosurgery;  Laterality: N/A;   APPLICATION OF ROBOTIC ASSISTANCE FOR SPINAL PROCEDURE N/A 10/27/2020   Procedure: APPLICATION OF ROBOTIC ASSISTANCE FOR SPINAL PROCEDURE;  Surgeon: Barnett Abu, MD;  Location: [facility];  Service: Neurosurgery;  Laterality: N/A;   BACK SURGERY  1986   lumb lam   BREAST BIOPSY Right 2011   CARPAL TUNNEL RELEASE  2000   right   CARPAL TUNNEL RELEASE  2012   left   CHOLECYSTECTOMY N/A 08/24/2018   Procedure: LAPAROSCOPIC CHOLECYSTECTOMY WITH INTRAOPERATIVE CHOLANGIOGRAM;  Surgeon: Claud Kelp, MD;  Location: [facility] OR;  Service: General;  Laterality:  N/A;   COLONOSCOPY     CYSTOCELE REPAIR  2007   sling   DISTAL INTERPHALANGEAL JOINT FUSION Right 02/04/2014   Procedure: FUSION DISTAL INTERPHALANGEAL JOINT RIGHT INDEX FINGER ;  Surgeon: Cindee Salt, MD;  Location: [facility];  Service: Orthopedics;  Laterality: Right;   EYE SURGERY Bilateral    Cataract surgery with lens implant   KNEE SURGERY  2009,2011   partial knee   LAPAROSCOPY N/A 08/24/2018   Procedure: LAPAROSCOPY DIAGNOSTIC;  Surgeon: Claud Kelp, MD;  Location: [facility] OR;  Service: General;  Laterality: N/A;   LUMBAR PERCUTANEOUS PEDICLE SCREW 4 LEVEL N/A 10/27/2020   Procedure: Thoracic nine-ten Laminectomy with extension of fusion from Thoracic ten to Thoracic six with segmental fixation (cemented) with pedicle screw and mazor;  Surgeon: Barnett Abu, MD;  Location: [facility] OR;  Service: Neurosurgery;  Laterality: N/A;   OPEN REDUCTION INTERNAL FIXATION (ORIF) PROXIMAL PHALANX Left 08/30/2013   Procedure: OPEN REDUCTION INTERNAL FIXATION (ORIF) PROXIMAL PHALANX FRACTURE LEFT SMALL FINGER; SPLINT RING FINGER;  Surgeon: Nicki Reaper, MD;  Location: [facility];  Service: Orthopedics;  Laterality: Left;   POSTERIOR LUMBAR FUSION 4 LEVEL N/A 07/17/2017   Procedure: Thoracic Ten - Lumbar Five revison of hardware with Mazor;  Surgeon: Barnett Abu, MD;  Location: [facility];  Service: Neurosurgery;  Laterality: N/A;  Thoracic/Lumbar   PTOSIS  Seated Heel Raise  - 2 x daily - 7 x weekly - 1 sets - 10 reps - 3 hold - Seated Knee Flexion Stretch  - 1 x daily - 7 x weekly - 1 sets - 10 reps - 15 hold  ASSESSMENT:  CLINICAL IMPRESSION: Patient reports that she is getting injection in hip Thursday from [place]. Tolerated there ex well for strengthening and gait. Still struggles with balance.  She had a fall about 3 weeks ago and really hurt her face and has some back and hip pain now, she is now having some fear of falling and hesitant.  When we walk with the [facility] she uses it on the left side and does okay still creeps with furniture and walls, if she uses the cane in the right hand she really has the right hip go out into a trendelenberg pattern that is significant and she is much more unsteady.  Her TUG time was worse after the fall after she had a nice improvement.  She also had an increase in knee strength.    OBJECTIVE IMPAIRMENTS: Abnormal gait, cardiopulmonary status limiting activity, decreased activity tolerance, decreased balance, decreased endurance, decreased mobility, difficulty walking, decreased ROM,  decreased strength, increased edema, impaired flexibility, improper body mechanics, and pain.   REHAB POTENTIAL: Good  CLINICAL DECISION MAKING: Stable/uncomplicated  EVALUATION COMPLEXITY: Low   GOALS: Goals reviewed with patient? Yes  SHORT TERM GOALS: Target date: 11/11/22 Independent with initial HEP Goal status: met 11/08/22  LONG TERM GOALS: Target date: 01/27/23  Independent with advanced HEP Goal status: progressing 12/29/22  2.  Decrease pain 50% Goal status: ongoing 01/26/23 exacerbation of right hip pain  3.  Increase AROM to 10-110 degrees flexion Goal status: 11/17/22 MET 5-120  4.  Go up and down stairs safely step over step with hand rails Goal status: Progressing 01/26/23, still struggles with this  5.  Decrease swelling by 4cm Goal status: met 01/12/23  6.  Decrease TUG to 24 seconds Goal status: Met  17.57 12/08/22   PLAN:  PT FREQUENCY: 1-2x/week  PT DURATION: 12 weeks  PLANNED INTERVENTIONS: Therapeutic exercises, Therapeutic activity, Neuromuscular re-education, Balance training, Gait training, Patient/Family education, Self Care, Joint mobilization, Joint manipulation, Stair training, Electrical stimulation, Vasopneumatic device, and Manual therapy  PLAN FOR NEXT SESSION:  may need to look at renewal and ask for more visits, she is improving but the hip is a hinderance to progressing and her having a recent fall has set her back due to fear and some pain   Joshawn Crissman,ANGIE, PTA  [facility] [place], [place], [address] Phone: [phone]   Fax:  [phone]  Patient Details  Name: YSAMAR KONO MRN: [id number] Date of Birth: 1932-04-30 Referring Provider:  Dannielle Huh, MD  Encounter Date: 01/31/2023   Suanne Marker, PTA 01/31/2023, 1:21 PM  [facility] [address] [place], [place], [address] Phone:  [phone]   Fax:  [phone]  Seated Heel Raise  - 2 x daily - 7 x weekly - 1 sets - 10 reps - 3 hold - Seated Knee Flexion Stretch  - 1 x daily - 7 x weekly - 1 sets - 10 reps - 15 hold  ASSESSMENT:  CLINICAL IMPRESSION: Patient reports that she is getting injection in hip Thursday from [place]. Tolerated there ex well for strengthening and gait. Still struggles with balance.  She had a fall about 3 weeks ago and really hurt her face and has some back and hip pain now, she is now having some fear of falling and hesitant.  When we walk with the [facility] she uses it on the left side and does okay still creeps with furniture and walls, if she uses the cane in the right hand she really has the right hip go out into a trendelenberg pattern that is significant and she is much more unsteady.  Her TUG time was worse after the fall after she had a nice improvement.  She also had an increase in knee strength.    OBJECTIVE IMPAIRMENTS: Abnormal gait, cardiopulmonary status limiting activity, decreased activity tolerance, decreased balance, decreased endurance, decreased mobility, difficulty walking, decreased ROM,  decreased strength, increased edema, impaired flexibility, improper body mechanics, and pain.   REHAB POTENTIAL: Good  CLINICAL DECISION MAKING: Stable/uncomplicated  EVALUATION COMPLEXITY: Low   GOALS: Goals reviewed with patient? Yes  SHORT TERM GOALS: Target date: 11/11/22 Independent with initial HEP Goal status: met 11/08/22  LONG TERM GOALS: Target date: 01/27/23  Independent with advanced HEP Goal status: progressing 12/29/22  2.  Decrease pain 50% Goal status: ongoing 01/26/23 exacerbation of right hip pain  3.  Increase AROM to 10-110 degrees flexion Goal status: 11/17/22 MET 5-120  4.  Go up and down stairs safely step over step with hand rails Goal status: Progressing 01/26/23, still struggles with this  5.  Decrease swelling by 4cm Goal status: met 01/12/23  6.  Decrease TUG to 24 seconds Goal status: Met  17.57 12/08/22   PLAN:  PT FREQUENCY: 1-2x/week  PT DURATION: 12 weeks  PLANNED INTERVENTIONS: Therapeutic exercises, Therapeutic activity, Neuromuscular re-education, Balance training, Gait training, Patient/Family education, Self Care, Joint mobilization, Joint manipulation, Stair training, Electrical stimulation, Vasopneumatic device, and Manual therapy  PLAN FOR NEXT SESSION:  may need to look at renewal and ask for more visits, she is improving but the hip is a hinderance to progressing and her having a recent fall has set her back due to fear and some pain   Joshawn Crissman,ANGIE, PTA  [facility] [place], [place], [address] Phone: [phone]   Fax:  [phone]  Patient Details  Name: YSAMAR KONO MRN: [id number] Date of Birth: 1932-04-30 Referring Provider:  Dannielle Huh, MD  Encounter Date: 01/31/2023   Suanne Marker, PTA 01/31/2023, 1:21 PM  [place] [address] [place], [place], [address] Phone:  [phone]   Fax:  [phone]

## 2023-02-07 ENCOUNTER — Encounter: Payer: Self-pay | Admitting: Physical Therapy

## 2023-02-07 ENCOUNTER — Ambulatory Visit: Payer: Medicare Other | Attending: Orthopedic Surgery | Admitting: Physical Therapy

## 2023-02-07 DIAGNOSIS — M6281 Muscle weakness (generalized): Secondary | ICD-10-CM | POA: Diagnosis present

## 2023-02-07 DIAGNOSIS — M5459 Other low back pain: Secondary | ICD-10-CM | POA: Insufficient documentation

## 2023-02-07 DIAGNOSIS — M25562 Pain in left knee: Secondary | ICD-10-CM | POA: Diagnosis present

## 2023-02-07 DIAGNOSIS — M25662 Stiffness of left knee, not elsewhere classified: Secondary | ICD-10-CM | POA: Insufficient documentation

## 2023-02-07 DIAGNOSIS — R262 Difficulty in walking, not elsewhere classified: Secondary | ICD-10-CM | POA: Insufficient documentation

## 2023-02-07 DIAGNOSIS — R600 Localized edema: Secondary | ICD-10-CM | POA: Insufficient documentation

## 2023-02-07 NOTE — Therapy (Signed)
OUTPATIENT PHYSICAL THERAPY LOWER EXTREMITY TREATMENT    Patient Name: [name] MRN: [id number] DOB:08-28-1932, 89 y.o., female Today's Date: 02/07/2023  END OF SESSION:  PT End of Session - 02/07/23 1557     Visit Number 24    Date for PT Re-Evaluation 02/01/23    PT Start Time 1600    PT Stop Time 1645    PT Time Calculation (min) 45 min    Activity Tolerance Patient tolerated treatment well    Behavior During Therapy [facility] for tasks assessed/performed              Past Medical History:  Diagnosis Date   Acalculous cholecystitis 08/24/2018   Arthritis    DVT of lower extremity (deep venous thrombosis) (HCC)    right leg - after back surgery in 2019   GERD (gastroesophageal reflux disease)    History of blood transfusion    Hypothyroidism    PONV (postoperative nausea and vomiting)    Ptosis of both eyelids    Vertigo    Wears glasses    reading   Past Surgical History:  Procedure Laterality Date   ABDOMINAL HYSTERECTOMY  1986   APPLICATION OF ROBOTIC ASSISTANCE FOR SPINAL PROCEDURE N/A 07/17/2017   Procedure: APPLICATION OF ROBOTIC ASSISTANCE FOR SPINAL PROCEDURE;  Surgeon: Barnett Abu, MD;  Location: [facility] OR;  Service: Neurosurgery;  Laterality: N/A;   APPLICATION OF ROBOTIC ASSISTANCE FOR SPINAL PROCEDURE N/A 09/01/2017   Procedure: APPLICATION OF ROBOTIC ASSISTANCE FOR SPINAL PROCEDURE;  Surgeon: Barnett Abu, MD;  Location: [facility] OR;  Service: Neurosurgery;  Laterality: N/A;   APPLICATION OF ROBOTIC ASSISTANCE FOR SPINAL PROCEDURE N/A 10/27/2020   Procedure: APPLICATION OF ROBOTIC ASSISTANCE FOR SPINAL PROCEDURE;  Surgeon: Barnett Abu, MD;  Location: [facility] OR;  Service: Neurosurgery;  Laterality: N/A;   BACK SURGERY  1986   lumb lam   BREAST BIOPSY Right 2011   CARPAL TUNNEL RELEASE  2000   right   CARPAL TUNNEL RELEASE  2012   left   CHOLECYSTECTOMY N/A 08/24/2018   Procedure: LAPAROSCOPIC CHOLECYSTECTOMY WITH INTRAOPERATIVE CHOLANGIOGRAM;   Surgeon: Claud Kelp, MD;  Location: [facility] OR;  Service: General;  Laterality: N/A;   COLONOSCOPY     CYSTOCELE REPAIR  2007   sling   DISTAL INTERPHALANGEAL JOINT FUSION Right 02/04/2014   Procedure: FUSION DISTAL INTERPHALANGEAL JOINT RIGHT INDEX FINGER ;  Surgeon: Cindee Salt, MD;  Location: [facility];  Service: Orthopedics;  Laterality: Right;   EYE SURGERY Bilateral    Cataract surgery with lens implant   KNEE SURGERY  2009,2011   partial knee   LAPAROSCOPY N/A 08/24/2018   Procedure: LAPAROSCOPY DIAGNOSTIC;  Surgeon: Claud Kelp, MD;  Location: [facility] OR;  Service: General;  Laterality: N/A;   LUMBAR PERCUTANEOUS PEDICLE SCREW 4 LEVEL N/A 10/27/2020   Procedure: Thoracic nine-ten Laminectomy with extension of fusion from Thoracic ten to Thoracic six with segmental fixation (cemented) with pedicle screw and mazor;  Surgeon: Barnett Abu, MD;  Location: [facility] OR;  Service: Neurosurgery;  Laterality: N/A;   OPEN REDUCTION INTERNAL FIXATION (ORIF) PROXIMAL PHALANX Left 08/30/2013   Procedure: OPEN REDUCTION INTERNAL FIXATION (ORIF) PROXIMAL PHALANX FRACTURE LEFT SMALL FINGER; SPLINT RING FINGER;  Surgeon: Nicki Reaper, MD;  Location: [facility];  Service: Orthopedics;  Laterality: Left;   POSTERIOR LUMBAR FUSION 4 LEVEL N/A 07/17/2017   Procedure: Thoracic Ten - Lumbar Five revison of hardware with Mazor;  Surgeon: Barnett Abu, MD;  Location: [facility] OR;  Service: Neurosurgery;  Laterality: N/A;  Thoracic/Lumbar   PTOSIS REPAIR Bilateral 07/27/2015   Procedure: PTOSIS REPAIR;  Surgeon: Louisa Second, MD;  Location: [facility];  Service: Plastics;  Laterality: Bilateral;   SHOULDER SURGERY     rt rcr,and lt   TENOLYSIS Left 05/21/2021   Procedure: RELEASE EXTENSOR POLLICIS LONGUS LEFT;  Surgeon: Cindee Salt, MD;  Location: [facility];  Service: Orthopedics;  Laterality: Left;   TENOLYSIS Left 06/08/2021   Procedure: RELEASE EXTENSOR  POLLICIS LONGUS LEFT;  Surgeon: Cindee Salt, MD;  Location: [facility];  Service: Orthopedics;  Laterality: Left;   TONSILLECTOMY     TOTAL KNEE ARTHROPLASTY Right 12/23/2019   Procedure: TOTAL KNEE ARTHROPLASTY;  Surgeon: Dannielle Huh, MD;  Location: [facility];  Service: Orthopedics;  Laterality: Right;   TRIGGER FINGER RELEASE Left 05/21/2021   Procedure: RELEASE TRIGGER FINGER/A-1 PULLEY LEFT MIDDLE FINGER;  Surgeon: Cindee Salt, MD;  Location: [facility];  Service: Orthopedics;  Laterality: Left;   TRIGGER FINGER RELEASE Left 06/08/2021   Procedure: RELEASE A-1 PULLEY LEFT MIDDLE FINGER;  Surgeon: Cindee Salt, MD;  Location: [facility];  Service: Orthopedics;  Laterality: Left;   Patient Active Problem List   Diagnosis Date Noted   Myelopathy concurrent with and due to spinal stenosis of thoracic region (HCC) 10/27/2020   S/P total knee arthroplasty, right 12/23/2019   Acalculous cholecystitis 08/24/2018   Osteoarthritis of right knee    Vertigo    DDD (degenerative disc disease), cervical    Benign essential HTN    History of DVT (deep vein thrombosis)    Tachycardia    Steroid-induced hyperglycemia    Hypothyroidism    Neuropathic pain    Acute blood loss anemia    S/P lumbar fusion    Closed L5 vertebral fracture (HCC) 08/31/2017   Closed fracture of fifth lumbar vertebra (HCC) 08/30/2017   Lumbar vertebral fracture (HCC) 07/17/2017   Scoliosis 06/13/2017    PCP: Burton Apley, MD  REFERRING PROVIDER: Sherlean Foot, MD  REFERRING DIAG: S/P left TKA  THERAPY DIAG:  Stiffness of left knee, not elsewhere classified  Difficulty in walking, not elsewhere classified  Muscle weakness (generalized)  Acute pain of left knee  Localized edema  Rationale for Evaluation and Treatment: Rehabilitation  ONSET DATE: 10/17/22  SUBJECTIVE:   SUBJECTIVE STATEMENT: Has a little inner ear earlier, leg is doing better PERTINENT HISTORY: See  above PAIN:  Are you having pain? Yes: NPRS scale: 0/10 Pain location: right hip Pain description: ache,sore deep Aggravating factors: unsure why the increased pain Relieving factors: massage helps, tylenol  PRECAUTIONS: None  RED FLAGS: None   WEIGHT BEARING RESTRICTIONS: No  FALLS:  Has patient fallen in last 6 months?yes had a fall 3 weeks ago, now with fear of walking  LIVING ENVIRONMENT: Lives with: lives with their family and lives with their spouse Lives in: House/apartment Stairs: Yes: Internal: 12 steps; can reach both Has following equipment at home: Environmental consultant - 4 wheeled, Shower bench, and bed side commode  OCCUPATION: retired  PLOF: Independent and was using a SPC, she was doing some cooking and cleaning  PATIENT GOALS: have good ROM, less pain, walk better, go up and down stairs  NEXT MD VISIT: 11/30/22  OBJECTIVE:   DIAGNOSTIC FINDINGS: s/p left TKA  PATIENT SURVEYS:  FOTO 6.5  COGNITION: Overall cognitive status: Within functional limits for tasks assessed     SENSATION: WFL Numbness laterally  EDEMA:  Circumferential: right mid patella 46 cm, left 55 cm  PALPATION: Very swollen in the knee, the shin and the foot, very tender to palpation in the thigh, knee and shin, very sensitive where the tourniquet was  LOWER EXTREMITY ROM:  Active ROM Left AROM eval Left PROM eval Left AROM  Sitting 11/17/22 L knee AROM 12/08/22 Left knee AROM 01/26/23  Hip flexion       Hip extension       Hip abduction       Hip adduction       Hip internal rotation       Hip external rotation       Knee flexion 80 85 120 125 126  Knee extension 27 20 5 8 2   Ankle dorsiflexion       Ankle plantarflexion       Ankle inversion       Ankle eversion        (Blank rows = not tested)  LOWER EXTREMITY MMT:  MMT Right eval Left eval Left  01/26/23  Hip flexion     Hip extension     Hip abduction     Hip adduction     Hip internal rotation     Hip external  rotation     Knee flexion  3- 4-  Knee extension  2 3+  Ankle dorsiflexion     Ankle plantarflexion     Ankle inversion     Ankle eversion      (Blank rows = not tested) FUNCTIONAL TESTS:  Timed up and go (TUG): 51 seconds, 12/29/22 = 17 seconds, 01/26/23 18 seconds but is now limited by the right hip pain  GAIT: Distance walked: 50 feet Assistive device utilized: Walker - 2 wheeled Level of assistance: SBA Comments: slow and guarded, small steps   TODAY'S TREATMENT:                                                                                                                              DATE:  02/07/23 Nustep L 5 HS curls 25lb 2x15, Single leg 15lb x12 each Leg extension both legs 10# 2x10, single legs 5# 2x5  Ball b/n knees squeeze 20x S2S elevated mat table  2lb hip abd and ext w/ RW x12 each 2lb 6in alt taps with SPC and HHA 2x5, then without weight Gait SPC 13ft x2  01/31/23 Nustep L 5 LEg curls 25# 2x10, single legs 15# x10 each Leg extension both legs 10# x10, single legs 5# 2x5  Ball b/n knees squeeze 20x Green tband clamshells 2 sets 10 Green tband hip flexion 2 sets 10 Gait with SPC 6 inch box taps with [facility]   01/26/23 Nustep level 5 x 5 minutes MMT/ROM/TUG tests Step over step on 4" stairs, biggest issue is the right hip pain and weakness Gait with SPC and CGA/SBA in the clinic 110', then outside slopes and curbs with CGA and minA  for curbs, also very unsure with thresholds LEg curls 20# 2x10, single legs 15# x10 each Leg extension both legs 5# x10, single legs 5# 2x5  Supine bridges SAQ 3# 3x10 Ball b/n knees squeeze Green tbnd clamshells  01/17/23 Nustep legs only level 5 x 6 minutes Leg curls 20# 2x10 Leg extension 5# 3 x10 Green tband clamshells Green tband ankle PF, red band DF 3# marches Passive hip stretches Active ankle exercises HHA side stepping  01/12/23 Nustep level 5 x 6 minutes Left knee green tband HS curls 3# left  leg LAQ 3# marches Green tband Investment banker, corporate b/n knees squeeze 20# leg curls  5# leg extension Passive hip stretches    PATIENT EDUCATION:  Education details: HEP/POC Person educated: Patient Education method: Programmer, multimedia, Facilities manager, Verbal cues, and Handouts Education comprehension: verbalized understanding  HOME EXERCISE PROGRAM: Access Code: [email or web address] URL: [email or web address] Date: 10/27/2022 Prepared by: Stacie Glaze  Exercises - Seated March  - 2 x daily - 7 x weekly - 1 sets - 10 reps - 3 hold - Seated Long Arc Quad  - 2 x daily - 7 x weekly - 1 sets - 10 reps - 3 hold - Seated Heel Raise  - 2 x daily - 7 x weekly - 1 sets - 10 reps - 3 hold - Seated Knee Flexion Stretch  - 1 x daily - 7 x weekly - 1 sets - 10 reps - 15 hold  ASSESSMENT:  CLINICAL IMPRESSION:  Tolerated there ex well for strengthening and gait. Still struggles with balance. She continues to have a  fear of falling and hesitant to try some activities such as alt box taps without HHA.  When we walk with the [facility] she uses it on the left side and does okay still creeps with furniture and walls, if she uses the cane in the right hand she really has the right hip go out into a trendelenberg pattern that is significant and she is much more unsteady.  Pt received a script for her hip but has to finish PT for her knee first..    OBJECTIVE IMPAIRMENTS: Abnormal gait, cardiopulmonary status limiting activity, decreased activity tolerance, decreased balance, decreased endurance, decreased mobility, difficulty walking, decreased ROM, decreased strength, increased edema, impaired flexibility, improper body mechanics, and pain.   REHAB POTENTIAL: Good  CLINICAL DECISION MAKING: Stable/uncomplicated  EVALUATION COMPLEXITY: Low   GOALS: Goals reviewed with patient? Yes  SHORT TERM GOALS: Target date: 11/11/22 Independent with initial HEP Goal status: met 11/08/22  LONG TERM GOALS: Target  date: 01/27/23  Independent with advanced HEP Goal status: progressing 12/29/22  2.  Decrease pain 50% Goal status: ongoing 01/26/23 exacerbation of right hip pain  3.  Increase AROM to 10-110 degrees flexion Goal status: 11/17/22 MET 5-120  4.  Go up and down stairs safely step over step with hand rails Goal status: Progressing 01/26/23, still struggles with this  5.  Decrease swelling by 4cm Goal status: met 01/12/23  6.  Decrease TUG to 24 seconds Goal status: Met  17.57 12/08/22   PLAN:  PT FREQUENCY: 1-2x/week  PT DURATION: 12 weeks  PLANNED INTERVENTIONS: Therapeutic exercises, Therapeutic activity, Neuromuscular re-education, Balance training, Gait training, Patient/Family education, Self Care, Joint mobilization, Joint manipulation, Stair training, Electrical stimulation, Vasopneumatic device, and Manual therapy  PLAN FOR NEXT SESSION:  may need to look at renewal and ask for more visits, she is improving but the hip is a hinderance to progressing and her having a recent fall has  set her back due to fear and some pain   Grayce Sessions, PTA

## 2023-02-14 ENCOUNTER — Ambulatory Visit: Payer: Medicare Other | Admitting: Physical Therapy

## 2023-02-16 ENCOUNTER — Encounter: Payer: Medicare Other | Admitting: Physical Therapy

## 2023-02-20 ENCOUNTER — Ambulatory Visit: Payer: Medicare Other | Admitting: Physical Therapy

## 2023-02-20 ENCOUNTER — Encounter: Payer: Self-pay | Admitting: Physical Therapy

## 2023-02-20 DIAGNOSIS — R600 Localized edema: Secondary | ICD-10-CM

## 2023-02-20 DIAGNOSIS — M25562 Pain in left knee: Secondary | ICD-10-CM

## 2023-02-20 DIAGNOSIS — M6281 Muscle weakness (generalized): Secondary | ICD-10-CM

## 2023-02-20 DIAGNOSIS — M25662 Stiffness of left knee, not elsewhere classified: Secondary | ICD-10-CM

## 2023-02-20 DIAGNOSIS — R262 Difficulty in walking, not elsewhere classified: Secondary | ICD-10-CM

## 2023-02-20 DIAGNOSIS — M5459 Other low back pain: Secondary | ICD-10-CM

## 2023-02-20 NOTE — Therapy (Signed)
OUTPATIENT PHYSICAL THERAPY LOWER EXTREMITY TREATMENT    Patient Name: [name] MRN: [id number] DOB:[birth year], 89 y.o., female Today's Date: 02/20/2023  END OF SESSION:  PT End of Session - 02/20/23 1546     Visit Number 25    Date for PT Re-Evaluation 03/22/23    Authorization Type [employer] Medicare  6/8    PT Start Time 1524    PT Stop Time 1614    PT Time Calculation (min) 50 min    Activity Tolerance Patient tolerated treatment well    Behavior During Therapy [facility] for tasks assessed/performed              Past Medical History:  Diagnosis Date   Acalculous cholecystitis 08/24/2018   Arthritis    DVT of lower extremity (deep venous thrombosis) (HCC)    right leg - after back surgery in 2019   GERD (gastroesophageal reflux disease)    History of blood transfusion    Hypothyroidism    PONV (postoperative nausea and vomiting)    Ptosis of both eyelids    Vertigo    Wears glasses    reading   Past Surgical History:  Procedure Laterality Date   ABDOMINAL HYSTERECTOMY  1986   APPLICATION OF ROBOTIC ASSISTANCE FOR SPINAL PROCEDURE N/A 07/17/2017   Procedure: APPLICATION OF ROBOTIC ASSISTANCE FOR SPINAL PROCEDURE;  Surgeon: Barnett Abu, MD;  Location: [facility] OR;  Service: Neurosurgery;  Laterality: N/A;   APPLICATION OF ROBOTIC ASSISTANCE FOR SPINAL PROCEDURE N/A 09/01/2017   Procedure: APPLICATION OF ROBOTIC ASSISTANCE FOR SPINAL PROCEDURE;  Surgeon: Barnett Abu, MD;  Location: [facility] OR;  Service: Neurosurgery;  Laterality: N/A;   APPLICATION OF ROBOTIC ASSISTANCE FOR SPINAL PROCEDURE N/A 10/27/2020   Procedure: APPLICATION OF ROBOTIC ASSISTANCE FOR SPINAL PROCEDURE;  Surgeon: Barnett Abu, MD;  Location: [facility] OR;  Service: Neurosurgery;  Laterality: N/A;   BACK SURGERY  1986   lumb lam   BREAST BIOPSY Right 2011   CARPAL TUNNEL RELEASE  2000   right   CARPAL TUNNEL RELEASE  2012   left   CHOLECYSTECTOMY N/A 08/24/2018   Procedure: LAPAROSCOPIC CHOLECYSTECTOMY  WITH INTRAOPERATIVE CHOLANGIOGRAM;  Surgeon: Claud Kelp, MD;  Location: [facility] OR;  Service: General;  Laterality: N/A;   COLONOSCOPY     CYSTOCELE REPAIR  2007   sling   DISTAL INTERPHALANGEAL JOINT FUSION Right 02/04/2014   Procedure: FUSION DISTAL INTERPHALANGEAL JOINT RIGHT INDEX FINGER ;  Surgeon: Cindee Salt, MD;  Location: [facility];  Service: Orthopedics;  Laterality: Right;   EYE SURGERY Bilateral    Cataract surgery with lens implant   KNEE SURGERY  2009,2011   partial knee   LAPAROSCOPY N/A 08/24/2018   Procedure: LAPAROSCOPY DIAGNOSTIC;  Surgeon: Claud Kelp, MD;  Location: [facility] OR;  Service: General;  Laterality: N/A;   LUMBAR PERCUTANEOUS PEDICLE SCREW 4 LEVEL N/A 10/27/2020   Procedure: Thoracic nine-ten Laminectomy with extension of fusion from Thoracic ten to Thoracic six with segmental fixation (cemented) with pedicle screw and mazor;  Surgeon: Barnett Abu, MD;  Location: [facility] OR;  Service: Neurosurgery;  Laterality: N/A;   OPEN REDUCTION INTERNAL FIXATION (ORIF) PROXIMAL PHALANX Left 08/30/2013   Procedure: OPEN REDUCTION INTERNAL FIXATION (ORIF) PROXIMAL PHALANX FRACTURE LEFT SMALL FINGER; SPLINT RING FINGER;  Surgeon: Nicki Reaper, MD;  Location:  SURGERY CENTER;  Service: Orthopedics;  Laterality: Left;   POSTERIOR LUMBAR FUSION 4 LEVEL N/A 07/17/2017   Procedure: Thoracic Ten - Lumbar Five revison of hardware with Mazor;  Surgeon: Barnett Abu, MD;  [facility];  Service: Neurosurgery;  Laterality: N/A;  Thoracic/Lumbar   PTOSIS REPAIR Bilateral 07/27/2015   Procedure: PTOSIS REPAIR;  Surgeon: Louisa Second, MD;  Location: [facility];  Service: Plastics;  Laterality: Bilateral;   SHOULDER SURGERY     rt rcr,and lt   TENOLYSIS Left 05/21/2021   Procedure: RELEASE EXTENSOR POLLICIS LONGUS LEFT;  Surgeon: Cindee Salt, MD;  Location: [facility];  Service: Orthopedics;  Laterality: Left;   TENOLYSIS Left  06/08/2021   Procedure: RELEASE EXTENSOR POLLICIS LONGUS LEFT;  Surgeon: Cindee Salt, MD;  Location: [facility];  Service: Orthopedics;  Laterality: Left;   TONSILLECTOMY     TOTAL KNEE ARTHROPLASTY Right 12/23/2019   Procedure: TOTAL KNEE ARTHROPLASTY;  Surgeon: Dannielle Huh, MD;  Location: [facility];  Service: Orthopedics;  Laterality: Right;   TRIGGER FINGER RELEASE Left 05/21/2021   Procedure: RELEASE TRIGGER FINGER/A-1 PULLEY LEFT MIDDLE FINGER;  Surgeon: Cindee Salt, MD;  Location: [facility];  Service: Orthopedics;  Laterality: Left;   TRIGGER FINGER RELEASE Left 06/08/2021   Procedure: RELEASE A-1 PULLEY LEFT MIDDLE FINGER;  Surgeon: Cindee Salt, MD;  Location: [facility];  Service: Orthopedics;  Laterality: Left;   Patient Active Problem List   Diagnosis Date Noted   Myelopathy concurrent with and due to spinal stenosis of thoracic region (HCC) 10/27/2020   S/P total knee arthroplasty, right 12/23/2019   Acalculous cholecystitis 08/24/2018   Osteoarthritis of right knee    Vertigo    DDD (degenerative disc disease), cervical    Benign essential HTN    History of DVT (deep vein thrombosis)    Tachycardia    Steroid-induced hyperglycemia    Hypothyroidism    Neuropathic pain    Acute blood loss anemia    S/P lumbar fusion    Closed L5 vertebral fracture (HCC) 08/31/2017   Closed fracture of fifth lumbar vertebra (HCC) 08/30/2017   Lumbar vertebral fracture (HCC) 07/17/2017   Scoliosis 06/13/2017    PCP: Burton Apley, MD  REFERRING PROVIDER: Sherlean Foot, MD  REFERRING DIAG: S/P left TKA  THERAPY DIAG:  Stiffness of left knee, not elsewhere classified  Difficulty in walking, not elsewhere classified  Muscle weakness (generalized)  Acute pain of left knee  Localized edema  Other low back pain  Rationale for Evaluation and Treatment: Rehabilitation  ONSET DATE: 10/17/22  SUBJECTIVE:   SUBJECTIVE STATEMENT: I want to walk  better, I got an injection in the right hip 2 weeks ago and it helped but not a lot PERTINENT HISTORY: See above PAIN:  Are you having pain? Yes: NPRS scale: 0/10 Pain location: right hip Pain description: ache,sore deep Aggravating factors: unsure why the increased pain Relieving factors: massage helps, tylenol  PRECAUTIONS: None  RED FLAGS: None   WEIGHT BEARING RESTRICTIONS: No  FALLS:  Has patient fallen in last 6 months?yes had a fall 3 weeks ago, now with fear of walking  LIVING ENVIRONMENT: Lives with: lives with their family and lives with their spouse Lives in: House/apartment Stairs: Yes: Internal: 12 steps; can reach both Has following equipment at home: Environmental consultant - 4 wheeled, Shower bench, and bed side commode  OCCUPATION: retired  PLOF: Independent and was using a SPC, she was doing some cooking and cleaning  PATIENT GOALS: have good ROM, less pain, walk better, go up and down stairs  NEXT MD VISIT: 11/30/22  OBJECTIVE:   DIAGNOSTIC FINDINGS: s/p left TKA  PATIENT SURVEYS:  FOTO 6.5  COGNITION: Overall cognitive status: Within functional limits for tasks assessed     SENSATION: WFL Numbness laterally  EDEMA:  Circumferential: right mid patella 46 cm, left 55 cm  PALPATION: Very swollen in the knee, the shin and the foot, very tender to palpation in the thigh, knee and shin, very sensitive where the tourniquet was  LOWER EXTREMITY ROM:  Active ROM Left AROM eval Left PROM eval Left AROM  Sitting 11/17/22 L knee AROM 12/08/22 Left knee AROM 01/26/23  Hip flexion       Hip extension       Hip abduction       Hip adduction       Hip internal rotation       Hip external rotation       Knee flexion 80 85 120 125 126  Knee extension 27 20 5 8 2   Ankle dorsiflexion       Ankle plantarflexion       Ankle inversion       Ankle eversion        (Blank rows = not tested)  LOWER EXTREMITY MMT:  MMT Right eval Left eval Left  01/26/23  Hip  flexion     Hip extension     Hip abduction     Hip adduction     Hip internal rotation     Hip external rotation     Knee flexion  3- 4-  Knee extension  2 3+  Ankle dorsiflexion     Ankle plantarflexion     Ankle inversion     Ankle eversion      (Blank rows = not tested) FUNCTIONAL TESTS:  Timed up and go (TUG): 51 seconds, 12/29/22 = 17 seconds, 01/26/23 18 seconds but is now limited by the right hip pain  GAIT: Distance walked: 50 feet Assistive device utilized: Environmental consultant - 2 wheeled Level of assistance: SBA Comments: slow and guarded, small steps   TODAY'S TREATMENT:                                                                                                                              DATE:  02/20/23 Gait with two SPC outside around the back parking [place], a few standing rest breaks and CGA for curbs 2.5# marches, hip abduction and hip extension Green tband right hip adduction Green tband right hip abduction HS curls green tband Yellow tband around the ankles with ball b/n knees IR of the hips Gait in clinic 2 SPC's x 200 feet, tried a little walking with SPC in the left hand and she does well, she does not like using the left hand, but in the right hand has the hip jutting out  02/07/23 Nustep L 5 HS curls 25lb 2x15, Single leg 15lb x12 each Leg extension both legs 10# 2x10, single legs 5# 2x5  Ball b/n knees squeeze 20x S2S elevated mat table  2lb hip abd and ext w/ RW x12 each 2lb 6in alt taps with SPC and HHA 2x5, then without weight Gait SPC 59ft x2  01/31/23 Nustep L 5 LEg curls 25# 2x10, single legs 15# x10 each Leg extension both legs 10# x10, single legs 5# 2x5  Ball b/n knees squeeze 20x Green tband clamshells 2 sets 10 Green tband hip flexion 2 sets 10 Gait with SPC 6 inch box taps with [facility]   01/26/23 Nustep level 5 x 5 minutes MMT/ROM/TUG tests Step over step on 4" stairs, biggest issue is the right hip pain and weakness Gait with  SPC and CGA/SBA in the clinic 110', then outside slopes and curbs with CGA and minA for curbs, also very unsure with thresholds LEg curls 20# 2x10, single legs 15# x10 each Leg extension both legs 5# x10, single legs 5# 2x5  Supine bridges SAQ 3# 3x10 Ball b/n knees squeeze Green tbnd clamshells  01/17/23 Nustep legs only level 5 x 6 minutes Leg curls 20# 2x10 Leg extension 5# 3 x10 Green tband clamshells Green tband ankle PF, red band DF 3# marches Passive hip stretches Active ankle exercises HHA side stepping  01/12/23 Nustep level 5 x 6 minutes Left knee green tband HS curls 3# left leg LAQ 3# marches Green tband Investment banker, corporate b/n knees squeeze 20# leg curls  5# leg extension Passive hip stretches    PATIENT EDUCATION:  Education details: HEP/POC Person educated: Patient Education method: Programmer, multimedia, Facilities manager, Verbal cues, and Handouts Education comprehension: verbalized understanding  HOME EXERCISE PROGRAM: Access Code: [email or web address] URL: [email or web address] Date: 10/27/2022 Prepared by: Stacie Glaze  Exercises - Seated March  - 2 x daily - 7 x weekly - 1 sets - 10 reps - 3 hold - Seated Long Arc Quad  - 2 x daily - 7 x weekly - 1 sets - 10 reps - 3 hold - Seated Heel Raise  - 2 x daily - 7 x weekly - 1 sets - 10 reps - 3 hold - Seated Knee Flexion Stretch  - 1 x daily - 7 x weekly - 1 sets - 10 reps - 15 hold  ASSESSMENT:  CLINICAL IMPRESSION:  Patient had an injection in the right hip two weeks ago, she repors some help, biggest issue is the weakness, I have seen her over the years and feel that this is residual from the back issues, but I feel that it has gotten worse.  When we walk with the [facility] she uses it on the left side and does okay still creeps with furniture and walls, if she uses the cane in the right hand she really has the right hip go out into a trendelenberg pattern that is significant and she is much more unsteady.  Pt  received a script for her hip but has to finish PT for her knee first..    OBJECTIVE IMPAIRMENTS: Abnormal gait, cardiopulmonary status limiting activity, decreased activity tolerance, decreased balance, decreased endurance, decreased mobility, difficulty walking, decreased ROM, decreased strength, increased edema, impaired flexibility, improper body mechanics, and pain.   REHAB POTENTIAL: Good  CLINICAL DECISION MAKING: Stable/uncomplicated  EVALUATION COMPLEXITY: Low   GOALS: Goals reviewed with patient? Yes  SHORT TERM GOALS: Target date: 11/11/22 Independent with initial HEP Goal status: met 11/08/22  LONG TERM GOALS: Target date: 01/27/23  Independent with advanced HEP Goal status: progressing 12/29/22  2.  Decrease pain 50% Goal status: ongoing 01/26/23 exacerbation of right hip pain  3.  Increase AROM to  10-110 degrees flexion Goal status: 11/17/22 MET 5-120  4.  Go up and down stairs safely step over step with hand rails Goal status: Progressing 01/26/23, still struggles with this  5.  Decrease swelling by 4cm Goal status: met 01/12/23  6.  Decrease TUG to 24 seconds Goal status: Met  17.57 12/08/22   PLAN:  PT FREQUENCY: 1-2x/week  PT DURATION: 12 weeks  PLANNED INTERVENTIONS: Therapeutic exercises, Therapeutic activity, Neuromuscular re-education, Balance training, Gait training, Patient/Family education, Self Care, Joint mobilization, Joint manipulation, Stair training, Electrical stimulation, Vasopneumatic device, and Manual therapy  PLAN FOR NEXT SESSION:  may start hip work in the next month   Jearld Lesch, PT

## 2023-02-21 ENCOUNTER — Ambulatory Visit: Payer: Medicare Other | Admitting: Physical Therapy

## 2023-02-28 ENCOUNTER — Encounter: Payer: Self-pay | Admitting: Physical Therapy

## 2023-02-28 ENCOUNTER — Ambulatory Visit: Payer: Medicare Other | Admitting: Physical Therapy

## 2023-02-28 DIAGNOSIS — M25662 Stiffness of left knee, not elsewhere classified: Secondary | ICD-10-CM

## 2023-02-28 DIAGNOSIS — R600 Localized edema: Secondary | ICD-10-CM

## 2023-02-28 DIAGNOSIS — M25562 Pain in left knee: Secondary | ICD-10-CM

## 2023-02-28 DIAGNOSIS — M6281 Muscle weakness (generalized): Secondary | ICD-10-CM

## 2023-02-28 DIAGNOSIS — R262 Difficulty in walking, not elsewhere classified: Secondary | ICD-10-CM

## 2023-02-28 NOTE — Therapy (Signed)
OUTPATIENT PHYSICAL THERAPY LOWER EXTREMITY TREATMENT    Patient Name: [name] MRN: [id number] DOB:07/12/1932, 89 y.o., female Today's Date: 02/28/2023  END OF SESSION:  PT End of Session - 02/28/23 1506     Visit Number 26    Date for PT Re-Evaluation 03/22/23    PT Start Time 1505    PT Stop Time 1550    PT Time Calculation (min) 45 min    Activity Tolerance Patient tolerated treatment well    Behavior During Therapy [facility] for tasks assessed/performed              Past Medical History:  Diagnosis Date   Acalculous cholecystitis 08/24/2018   Arthritis    DVT of lower extremity (deep venous thrombosis) (HCC)    right leg - after back surgery in 2019   GERD (gastroesophageal reflux disease)    History of blood transfusion    Hypothyroidism    PONV (postoperative nausea and vomiting)    Ptosis of both eyelids    Vertigo    Wears glasses    reading   Past Surgical History:  Procedure Laterality Date   ABDOMINAL HYSTERECTOMY  1986   APPLICATION OF ROBOTIC ASSISTANCE FOR SPINAL PROCEDURE N/A 07/17/2017   Procedure: APPLICATION OF ROBOTIC ASSISTANCE FOR SPINAL PROCEDURE;  Surgeon: Barnett Abu, MD;  Location: [facility] OR;  Service: Neurosurgery;  Laterality: N/A;   APPLICATION OF ROBOTIC ASSISTANCE FOR SPINAL PROCEDURE N/A 09/01/2017   Procedure: APPLICATION OF ROBOTIC ASSISTANCE FOR SPINAL PROCEDURE;  Surgeon: Barnett Abu, MD;  Location: [facility] OR;  Service: Neurosurgery;  Laterality: N/A;   APPLICATION OF ROBOTIC ASSISTANCE FOR SPINAL PROCEDURE N/A 10/27/2020   Procedure: APPLICATION OF ROBOTIC ASSISTANCE FOR SPINAL PROCEDURE;  Surgeon: Barnett Abu, MD;  Location: [facility] OR;  Service: Neurosurgery;  Laterality: N/A;   BACK SURGERY  1986   lumb lam   BREAST BIOPSY Right 2011   CARPAL TUNNEL RELEASE  2000   right   CARPAL TUNNEL RELEASE  2012   left   CHOLECYSTECTOMY N/A 08/24/2018   Procedure: LAPAROSCOPIC CHOLECYSTECTOMY WITH INTRAOPERATIVE CHOLANGIOGRAM;   Surgeon: Claud Kelp, MD;  Location: [facility] OR;  Service: General;  Laterality: N/A;   COLONOSCOPY     CYSTOCELE REPAIR  2007   sling   DISTAL INTERPHALANGEAL JOINT FUSION Right 02/04/2014   Procedure: FUSION DISTAL INTERPHALANGEAL JOINT RIGHT INDEX FINGER ;  Surgeon: Cindee Salt, MD;  Location: [facility];  Service: Orthopedics;  Laterality: Right;   EYE SURGERY Bilateral    Cataract surgery with lens implant   KNEE SURGERY  2009,2011   partial knee   LAPAROSCOPY N/A 08/24/2018   Procedure: LAPAROSCOPY DIAGNOSTIC;  Surgeon: Claud Kelp, MD;  Location: [facility] OR;  Service: General;  Laterality: N/A;   LUMBAR PERCUTANEOUS PEDICLE SCREW 4 LEVEL N/A 10/27/2020   Procedure: Thoracic nine-ten Laminectomy with extension of fusion from Thoracic ten to Thoracic six with segmental fixation (cemented) with pedicle screw and mazor;  Surgeon: Barnett Abu, MD;  Location: [facility] OR;  Service: Neurosurgery;  Laterality: N/A;   OPEN REDUCTION INTERNAL FIXATION (ORIF) PROXIMAL PHALANX Left 08/30/2013   Procedure: OPEN REDUCTION INTERNAL FIXATION (ORIF) PROXIMAL PHALANX FRACTURE LEFT SMALL FINGER; SPLINT RING FINGER;  Surgeon: Nicki Reaper, MD;  Location: [facility];  Service: Orthopedics;  Laterality: Left;   POSTERIOR LUMBAR FUSION 4 LEVEL N/A 07/17/2017   Procedure: Thoracic Ten - Lumbar Five revison of hardware with Mazor;  Surgeon: Barnett Abu, MD;  Location: [facility] OR;  Service: Neurosurgery;  Laterality: N/A;  Thoracic/Lumbar   PTOSIS REPAIR Bilateral 07/27/2015   Procedure: PTOSIS REPAIR;  Surgeon: Louisa Second, MD;  Location: [facility];  Service: Plastics;  Laterality: Bilateral;   SHOULDER SURGERY     rt rcr,and lt   TENOLYSIS Left 05/21/2021   Procedure: RELEASE EXTENSOR POLLICIS LONGUS LEFT;  Surgeon: Cindee Salt, MD;  Location: [facility];  Service: Orthopedics;  Laterality: Left;   TENOLYSIS Left 06/08/2021   Procedure: RELEASE EXTENSOR  POLLICIS LONGUS LEFT;  Surgeon: Cindee Salt, MD;  Location: [facility];  Service: Orthopedics;  Laterality: Left;   TONSILLECTOMY     TOTAL KNEE ARTHROPLASTY Right 12/23/2019   Procedure: TOTAL KNEE ARTHROPLASTY;  Surgeon: Dannielle Huh, MD;  Location: [facility];  Service: Orthopedics;  Laterality: Right;   TRIGGER FINGER RELEASE Left 05/21/2021   Procedure: RELEASE TRIGGER FINGER/A-1 PULLEY LEFT MIDDLE FINGER;  Surgeon: Cindee Salt, MD;  Location: [facility];  Service: Orthopedics;  Laterality: Left;   TRIGGER FINGER RELEASE Left 06/08/2021   Procedure: RELEASE A-1 PULLEY LEFT MIDDLE FINGER;  Surgeon: Cindee Salt, MD;  Location:  SURGERY CENTER;  Service: Orthopedics;  Laterality: Left;   Patient Active Problem List   Diagnosis Date Noted   Myelopathy concurrent with and due to spinal stenosis of thoracic region (HCC) 10/27/2020   S/P total knee arthroplasty, right 12/23/2019   Acalculous cholecystitis 08/24/2018   Osteoarthritis of right knee    Vertigo    DDD (degenerative disc disease), cervical    Benign essential HTN    History of DVT (deep vein thrombosis)    Tachycardia    Steroid-induced hyperglycemia    Hypothyroidism    Neuropathic pain    Acute blood loss anemia    S/P lumbar fusion    Closed L5 vertebral fracture (HCC) 08/31/2017   Closed fracture of fifth lumbar vertebra (HCC) 08/30/2017   Lumbar vertebral fracture (HCC) 07/17/2017   Scoliosis 06/13/2017    PCP: Burton Apley, MD  REFERRING PROVIDER: Sherlean Foot, MD  REFERRING DIAG: S/P left TKA  THERAPY DIAG:  Stiffness of left knee, not elsewhere classified  Difficulty in walking, not elsewhere classified  Acute pain of left knee  Localized edema  Muscle weakness (generalized)  Rationale for Evaluation and Treatment: Rehabilitation  ONSET DATE: 10/17/22  SUBJECTIVE:   SUBJECTIVE STATEMENT: "I got a cold" "I feel pretty good, My R side so weak" PERTINENT HISTORY: See  above PAIN:  Are you having pain? Yes: NPRS scale: 4/10 Pain location: right hip Pain description: ache,sore deep Aggravating factors: unsure why the increased pain Relieving factors: massage helps, tylenol  PRECAUTIONS: None  RED FLAGS: None   WEIGHT BEARING RESTRICTIONS: No  FALLS:  Has patient fallen in last 6 months?yes had a fall 3 weeks ago, now with fear of walking  LIVING ENVIRONMENT: Lives with: lives with their family and lives with their spouse Lives in: House/apartment Stairs: Yes: Internal: 12 steps; can reach both Has following equipment at home: Environmental consultant - 4 wheeled, Shower bench, and bed side commode  OCCUPATION: retired  PLOF: Independent and was using a SPC, she was doing some cooking and cleaning  PATIENT GOALS: have good ROM, less pain, walk better, go up and down stairs  NEXT MD VISIT: 11/30/22  OBJECTIVE:   DIAGNOSTIC FINDINGS: s/p left TKA  PATIENT SURVEYS:  FOTO 6.5  COGNITION: Overall cognitive status: Within functional limits for tasks assessed     SENSATION: WFL Numbness laterally  EDEMA:  Circumferential: right mid patella 46 cm, left 55 cm  PALPATION: Very swollen in the knee, the shin and the foot, very tender to palpation in the thigh, knee and shin, very sensitive where the tourniquet was  LOWER EXTREMITY ROM:  Active ROM Left AROM eval Left PROM eval Left AROM  Sitting 11/17/22 L knee AROM 12/08/22 Left knee AROM 01/26/23  Hip flexion       Hip extension       Hip abduction       Hip adduction       Hip internal rotation       Hip external rotation       Knee flexion 80 85 120 125 126  Knee extension 27 20 5 8 2   Ankle dorsiflexion       Ankle plantarflexion       Ankle inversion       Ankle eversion        (Blank rows = not tested)  LOWER EXTREMITY MMT:  MMT Right eval Left eval Left  01/26/23  Hip flexion     Hip extension     Hip abduction     Hip adduction     Hip internal rotation     Hip external  rotation     Knee flexion  3- 4-  Knee extension  2 3+  Ankle dorsiflexion     Ankle plantarflexion     Ankle inversion     Ankle eversion      (Blank rows = not tested) FUNCTIONAL TESTS:  Timed up and go (TUG): 51 seconds, 12/29/22 = 17 seconds, 01/26/23 18 seconds but is now limited by the right hip pain  GAIT: Distance walked: 50 feet Assistive device utilized: Environmental consultant - 2 wheeled Level of assistance: SBA Comments: slow and guarded, small steps   TODAY'S TREATMENT:                                                                                                                              DATE:  02/28/23 Gait with two SPC outside around half the back parking [place], CGA for curbs 2lb alt box taps 8in box two SPC 2x10 Gait in clinic 2 SPC's x 100 feet x2, tried a little walking with SPC in the left hand and she does well, she does not like using the left hand, but in the right hand has the hip jutting out HS curls green tband S2S 2x10 slightly elevated mat  Side step lenfht of Mat table some HHA x1  Gait no AD 19ft x4 SBA to CGA Backwards walking no AD x10 ft   02/20/23 Gait with two SPC outside around the back parking [place], a few standing rest breaks and CGA for curbs 2.5# marches, hip abduction and hip extension Green tband right hip adduction Green tband right hip abduction HS curls green tband Yellow tband around the ankles with ball b/n knees IR of the hips  Gait in clinic 2 SPC's x 200 feet, tried a little walking with SPC in the left hand and she does well, she does not like using the left hand, but in the right hand has the hip jutting out  02/07/23 Nustep L 5 HS curls 25lb 2x15, Single leg 15lb x12 each Leg extension both legs 10# 2x10, single legs 5# 2x5  Ball b/n knees squeeze 20x S2S elevated mat table  2lb hip abd and ext w/ RW x12 each 2lb 6in alt taps with SPC and HHA 2x5, then without weight Gait SPC 42ft x2  01/31/23 Nustep L 5 LEg curls  25# 2x10, single legs 15# x10 each Leg extension both legs 10# x10, single legs 5# 2x5  Ball b/n knees squeeze 20x Green tband clamshells 2 sets 10 Green tband hip flexion 2 sets 10 Gait with SPC 6 inch box taps with [facility]   01/26/23 Nustep level 5 x 5 minutes MMT/ROM/TUG tests Step over step on 4" stairs, biggest issue is the right hip pain and weakness Gait with SPC and CGA/SBA in the clinic 110', then outside slopes and curbs with CGA and minA for curbs, also very unsure with thresholds LEg curls 20# 2x10, single legs 15# x10 each Leg extension both legs 5# x10, single legs 5# 2x5  Supine bridges SAQ 3# 3x10 Ball b/n knees squeeze Green tbnd clamshells  01/17/23 Nustep legs only level 5 x 6 minutes Leg curls 20# 2x10 Leg extension 5# 3 x10 Green tband clamshells Green tband ankle PF, red band DF 3# marches Passive hip stretches Active ankle exercises HHA side stepping  01/12/23 Nustep level 5 x 6 minutes Left knee green tband HS curls 3# left leg LAQ 3# marches Green tband Investment banker, corporate b/n knees squeeze 20# leg curls  5# leg extension Passive hip stretches    PATIENT EDUCATION:  Education details: HEP/POC Person educated: Patient Education method: Programmer, multimedia, Facilities manager, Verbal cues, and Handouts Education comprehension: verbalized understanding  HOME EXERCISE PROGRAM: Access Code: [email or web address] URL: [email or web address] Date: 10/27/2022 Prepared by: Stacie Glaze  Exercises - Seated March  - 2 x daily - 7 x weekly - 1 sets - 10 reps - 3 hold - Seated Long Arc Quad  - 2 x daily - 7 x weekly - 1 sets - 10 reps - 3 hold - Seated Heel Raise  - 2 x daily - 7 x weekly - 1 sets - 10 reps - 3 hold - Seated Knee Flexion Stretch  - 1 x daily - 7 x weekly - 1 sets - 10 reps - 15 hold  ASSESSMENT:  CLINICAL IMPRESSION:  When we walk with the [facility] she uses it on the left side and does okay still creeps with furniture and walls, if she uses the  cane in the right hand she really has the right hip go out into a trendelenberg pattern that is significant and she is much more unsteady. Interventions focused on functional strength and ability. Encouragement needed but pt able to complete some functional interventions without the use of AD. R hip wakens noted throughout session. Cue not to drag LE's with side steps.  OBJECTIVE IMPAIRMENTS: Abnormal gait, cardiopulmonary status limiting activity, decreased activity tolerance, decreased balance, decreased endurance, decreased mobility, difficulty walking, decreased ROM, decreased strength, increased edema, impaired flexibility, improper body mechanics, and pain.   REHAB POTENTIAL: Good  CLINICAL DECISION MAKING: Stable/uncomplicated  EVALUATION COMPLEXITY: Low   GOALS: Goals reviewed with patient? Yes  SHORT TERM GOALS: Target  date: 11/11/22 Independent with initial HEP Goal status: met 11/08/22  LONG TERM GOALS: Target date: 01/27/23  Independent with advanced HEP Goal status: progressing 12/29/22  2.  Decrease pain 50% Goal status: ongoing 01/26/23 exacerbation of right hip pain  3.  Increase AROM to 10-110 degrees flexion Goal status: 11/17/22 MET 5-120  4.  Go up and down stairs safely step over step with hand rails Goal status: Progressing 01/26/23, still struggles with this  5.  Decrease swelling by 4cm Goal status: met 01/12/23  6.  Decrease TUG to 24 seconds Goal status: Met  17.57 12/08/22   PLAN:  PT FREQUENCY: 1-2x/week  PT DURATION: 12 weeks  PLANNED INTERVENTIONS: Therapeutic exercises, Therapeutic activity, Neuromuscular re-education, Balance training, Gait training, Patient/Family education, Self Care, Joint mobilization, Joint manipulation, Stair training, Electrical stimulation, Vasopneumatic device, and Manual therapy  PLAN FOR NEXT SESSION:  may start hip work in the next month   Grayce Sessions, PTA

## 2023-03-07 ENCOUNTER — Encounter: Payer: Self-pay | Admitting: Physical Therapy

## 2023-03-07 ENCOUNTER — Ambulatory Visit: Payer: Medicare Other | Attending: Orthopedic Surgery | Admitting: Physical Therapy

## 2023-03-07 DIAGNOSIS — R600 Localized edema: Secondary | ICD-10-CM | POA: Diagnosis present

## 2023-03-07 DIAGNOSIS — M25662 Stiffness of left knee, not elsewhere classified: Secondary | ICD-10-CM | POA: Insufficient documentation

## 2023-03-07 DIAGNOSIS — M25562 Pain in left knee: Secondary | ICD-10-CM | POA: Insufficient documentation

## 2023-03-07 DIAGNOSIS — M6281 Muscle weakness (generalized): Secondary | ICD-10-CM | POA: Diagnosis present

## 2023-03-07 DIAGNOSIS — R262 Difficulty in walking, not elsewhere classified: Secondary | ICD-10-CM | POA: Diagnosis present

## 2023-03-07 DIAGNOSIS — M5459 Other low back pain: Secondary | ICD-10-CM | POA: Insufficient documentation

## 2023-03-07 NOTE — Therapy (Signed)
OUTPATIENT PHYSICAL THERAPY LOWER EXTREMITY TREATMENT    Patient Name: ENGRID VIGGIANO MRN: [id number] DOB:07/22/1932, 89 y.o., female Today's Date: 03/07/2023  END OF SESSION:  PT End of Session - 03/07/23 1531     Visit Number 27    Date for PT Re-Evaluation 03/22/23    Authorization Type [employer] Medicare  5/6    PT Start Time 1528    PT Stop Time 1610    PT Time Calculation (min) 42 min    Activity Tolerance Patient tolerated treatment well    Behavior During Therapy WFL for tasks assessed/performed              Past Medical History:  Diagnosis Date   Acalculous cholecystitis 08/24/2018   Arthritis    DVT of lower extremity (deep venous thrombosis) (HCC)    right leg - after back surgery in 2019   GERD (gastroesophageal reflux disease)    History of blood transfusion    Hypothyroidism    PONV (postoperative nausea and vomiting)    Ptosis of both eyelids    Vertigo    Wears glasses    reading   Past Surgical History:  Procedure Laterality Date   ABDOMINAL HYSTERECTOMY  1986   APPLICATION OF ROBOTIC ASSISTANCE FOR SPINAL PROCEDURE N/A 07/17/2017   Procedure: APPLICATION OF ROBOTIC ASSISTANCE FOR SPINAL PROCEDURE;  Surgeon: Barnett Abu, MD;  Location: [facility] OR;  Service: Neurosurgery;  Laterality: N/A;   APPLICATION OF ROBOTIC ASSISTANCE FOR SPINAL PROCEDURE N/A 09/01/2017   Procedure: APPLICATION OF ROBOTIC ASSISTANCE FOR SPINAL PROCEDURE;  Surgeon: Barnett Abu, MD;  Location: [facility] OR;  Service: Neurosurgery;  Laterality: N/A;   APPLICATION OF ROBOTIC ASSISTANCE FOR SPINAL PROCEDURE N/A 10/27/2020   Procedure: APPLICATION OF ROBOTIC ASSISTANCE FOR SPINAL PROCEDURE;  Surgeon: Barnett Abu, MD;  Location: [facility] OR;  Service: Neurosurgery;  Laterality: N/A;   BACK SURGERY  1986   lumb lam   BREAST BIOPSY Right 2011   CARPAL TUNNEL RELEASE  2000   right   CARPAL TUNNEL RELEASE  2012   left   CHOLECYSTECTOMY N/A 08/24/2018   Procedure: LAPAROSCOPIC CHOLECYSTECTOMY  WITH INTRAOPERATIVE CHOLANGIOGRAM;  Surgeon: Claud Kelp, MD;  Location: [facility] OR;  Service: General;  Laterality: N/A;   COLONOSCOPY     CYSTOCELE REPAIR  2007   sling   DISTAL INTERPHALANGEAL JOINT FUSION Right 02/04/2014   Procedure: FUSION DISTAL INTERPHALANGEAL JOINT RIGHT INDEX FINGER ;  Surgeon: Cindee Salt, MD;  Location: [facility];  Service: Orthopedics;  Laterality: Right;   EYE SURGERY Bilateral    Cataract surgery with lens implant   KNEE SURGERY  2009,2011   partial knee   LAPAROSCOPY N/A 08/24/2018   Procedure: LAPAROSCOPY DIAGNOSTIC;  Surgeon: Claud Kelp, MD;  Location: [facility] OR;  Service: General;  Laterality: N/A;   LUMBAR PERCUTANEOUS PEDICLE SCREW 4 LEVEL N/A 10/27/2020   Procedure: Thoracic nine-ten Laminectomy with extension of fusion from Thoracic ten to Thoracic six with segmental fixation (cemented) with pedicle screw and mazor;  Surgeon: Barnett Abu, MD;  Location: [facility] OR;  Service: Neurosurgery;  Laterality: N/A;   OPEN REDUCTION INTERNAL FIXATION (ORIF) PROXIMAL PHALANX Left 08/30/2013   Procedure: OPEN REDUCTION INTERNAL FIXATION (ORIF) PROXIMAL PHALANX FRACTURE LEFT SMALL FINGER; SPLINT RING FINGER;  Surgeon: Nicki Reaper, MD;  Location: [facility];  Service: Orthopedics;  Laterality: Left;   POSTERIOR LUMBAR FUSION 4 LEVEL N/A 07/17/2017   Procedure: Thoracic Ten - Lumbar Five revison of hardware with Mazor;  Surgeon: Barnett Abu, MD;  Location: [facility] OR;  Service: Neurosurgery;  Laterality: N/A;  Thoracic/Lumbar   PTOSIS REPAIR Bilateral 07/27/2015   Procedure: PTOSIS REPAIR;  Surgeon: Louisa Second, MD;  Location: [facility];  Service: Plastics;  Laterality: Bilateral;   SHOULDER SURGERY     rt rcr,and lt   TENOLYSIS Left 05/21/2021   Procedure: RELEASE EXTENSOR POLLICIS LONGUS LEFT;  Surgeon: Cindee Salt, MD;  Location: [facility];  Service: Orthopedics;  Laterality: Left;   TENOLYSIS Left  06/08/2021   Procedure: RELEASE EXTENSOR POLLICIS LONGUS LEFT;  Surgeon: Cindee Salt, MD;  Location: [facility];  Service: Orthopedics;  Laterality: Left;   TONSILLECTOMY     TOTAL KNEE ARTHROPLASTY Right 12/23/2019   Procedure: TOTAL KNEE ARTHROPLASTY;  Surgeon: Dannielle Huh, MD;  Location: [facility];  Service: Orthopedics;  Laterality: Right;   TRIGGER FINGER RELEASE Left 05/21/2021   Procedure: RELEASE TRIGGER FINGER/A-1 PULLEY LEFT MIDDLE FINGER;  Surgeon: Cindee Salt, MD;  Location: [facility];  Service: Orthopedics;  Laterality: Left;   TRIGGER FINGER RELEASE Left 06/08/2021   Procedure: RELEASE A-1 PULLEY LEFT MIDDLE FINGER;  Surgeon: Cindee Salt, MD;  Location: [facility];  Service: Orthopedics;  Laterality: Left;   Patient Active Problem List   Diagnosis Date Noted   Myelopathy concurrent with and due to spinal stenosis of thoracic region (HCC) 10/27/2020   S/P total knee arthroplasty, right 12/23/2019   Acalculous cholecystitis 08/24/2018   Osteoarthritis of right knee    Vertigo    DDD (degenerative disc disease), cervical    Benign essential HTN    History of DVT (deep vein thrombosis)    Tachycardia    Steroid-induced hyperglycemia    Hypothyroidism    Neuropathic pain    Acute blood loss anemia    S/P lumbar fusion    Closed L5 vertebral fracture (HCC) 08/31/2017   Closed fracture of fifth lumbar vertebra (HCC) 08/30/2017   Lumbar vertebral fracture (HCC) 07/17/2017   Scoliosis 06/13/2017    PCP: Burton Apley, MD  REFERRING PROVIDER: Sherlean Foot, MD  REFERRING DIAG: S/P left TKA  THERAPY DIAG:  Stiffness of left knee, not elsewhere classified  Difficulty in walking, not elsewhere classified  Acute pain of left knee  Localized edema  Muscle weakness (generalized)  Other low back pain  Rationale for Evaluation and Treatment: Rehabilitation  ONSET DATE: 10/17/22  SUBJECTIVE:   SUBJECTIVE STATEMENT: No falls, I am  trying to do some more walking, sees Dr. Danielle Dess in January PERTINENT HISTORY: See above PAIN:  Are you having pain? Yes: NPRS scale: 4/10 Pain location: right hip Pain description: ache,sore deep Aggravating factors: unsure why the increased pain Relieving factors: massage helps, tylenol  PRECAUTIONS: None  RED FLAGS: None   WEIGHT BEARING RESTRICTIONS: No  FALLS:  Has patient fallen in last 6 months?yes had a fall 3 weeks ago, now with fear of walking  LIVING ENVIRONMENT: Lives with: lives with their family and lives with their spouse Lives in: House/apartment Stairs: Yes: Internal: 12 steps; can reach both Has following equipment at home: Environmental consultant - 4 wheeled, Shower bench, and bed side commode  OCCUPATION: retired  PLOF: Independent and was using a SPC, she was doing some cooking and cleaning  PATIENT GOALS: have good ROM, less pain, walk better, go up and down stairs  NEXT MD VISIT: 11/30/22  OBJECTIVE:   DIAGNOSTIC FINDINGS: s/p left TKA  PATIENT SURVEYS:  FOTO 6.5  COGNITION: Overall  cognitive status: Within functional limits for tasks assessed     SENSATION: WFL Numbness laterally  EDEMA:  Circumferential: right mid patella 46 cm, left 55 cm  PALPATION: Very swollen in the knee, the shin and the foot, very tender to palpation in the thigh, knee and shin, very sensitive where the tourniquet was  LOWER EXTREMITY ROM:  Active ROM Left AROM eval Left PROM eval Left AROM  Sitting 11/17/22 L knee AROM 12/08/22 Left knee AROM 01/26/23  Hip flexion       Hip extension       Hip abduction       Hip adduction       Hip internal rotation       Hip external rotation       Knee flexion 80 85 120 125 126  Knee extension 27 20 5 8 2   Ankle dorsiflexion       Ankle plantarflexion       Ankle inversion       Ankle eversion        (Blank rows = not tested)  LOWER EXTREMITY MMT:  MMT Right eval Left eval Left  01/26/23  Hip flexion     Hip extension      Hip abduction     Hip adduction     Hip internal rotation     Hip external rotation     Knee flexion  3- 4-  Knee extension  2 3+  Ankle dorsiflexion     Ankle plantarflexion     Ankle inversion     Ankle eversion      (Blank rows = not tested) FUNCTIONAL TESTS:  Timed up and go (TUG): 51 seconds, 12/29/22 = 17 seconds, 01/26/23 18 seconds but is now limited by the right hip pain  GAIT: Distance walked: 50 feet Assistive device utilized: Walker - 2 wheeled Level of assistance: SBA Comments: slow and guarded, small steps   TODAY'S TREATMENT:                                                                                                                              DATE:  03/07/23 Nustep level 5 x 6 minutes Leg curls 20# 2x10 Leg extension 20# 2x10 Gait 2 SPC's and SBA 200 feet Gait SPC 100' SBA x 3 Standing 5# straight arm pulls Standing ball toss On airex reaching and touching On airex head turns Red tband seated left hip abduction Ball b/n knees squeeze Side stepping  02/28/23 Gait with two SPC outside around half the back parking [place], CGA for curbs 2lb alt box taps 8in box two SPC 2x10 Gait in clinic 2 SPC's x 100 feet x2, tried a little walking with SPC in the left hand and she does well, she does not like using the left hand, but in the right hand has the hip jutting out HS curls green tband S2S 2x10 slightly elevated mat  Side step lenfht of Mat  table some HHA x1  Gait no AD 28ft x4 SBA to CGA Backwards walking no AD x10 ft   02/20/23 Gait with two SPC outside around the back parking [place], a few standing rest breaks and CGA for curbs 2.5# marches, hip abduction and hip extension Green tband right hip adduction Green tband right hip abduction HS curls green tband Yellow tband around the ankles with ball b/n knees IR of the hips Gait in clinic 2 SPC's x 200 feet, tried a little walking with SPC in the left hand and she does well, she does not like using  the left hand, but in the right hand has the hip jutting out  02/07/23 Nustep L 5 HS curls 25lb 2x15, Single leg 15lb x12 each Leg extension both legs 10# 2x10, single legs 5# 2x5  Ball b/n knees squeeze 20x S2S elevated mat table  2lb hip abd and ext w/ RW x12 each 2lb 6in alt taps with SPC and HHA 2x5, then without weight Gait SPC 14ft x2  01/31/23 Nustep L 5 LEg curls 25# 2x10, single legs 15# x10 each Leg extension both legs 10# x10, single legs 5# 2x5  Ball b/n knees squeeze 20x Green tband clamshells 2 sets 10 Green tband hip flexion 2 sets 10 Gait with SPC 6 inch box taps with [facility]   01/26/23 Nustep level 5 x 5 minutes MMT/ROM/TUG tests Step over step on 4" stairs, biggest issue is the right hip pain and weakness Gait with SPC and CGA/SBA in the clinic 110', then outside slopes and curbs with CGA and minA for curbs, also very unsure with thresholds LEg curls 20# 2x10, single legs 15# x10 each Leg extension both legs 5# x10, single legs 5# 2x5  Supine bridges SAQ 3# 3x10 Ball b/n knees squeeze Green tbnd clamshells  01/17/23 Nustep legs only level 5 x 6 minutes Leg curls 20# 2x10 Leg extension 5# 3 x10 Green tband clamshells Green tband ankle PF, red band DF 3# marches Passive hip stretches Active ankle exercises HHA side stepping  01/12/23 Nustep level 5 x 6 minutes Left knee green tband HS curls 3# left leg LAQ 3# marches Green tband Investment banker, corporate b/n knees squeeze 20# leg curls  5# leg extension Passive hip stretches    PATIENT EDUCATION:  Education details: HEP/POC Person educated: Patient Education method: Programmer, multimedia, Facilities manager, Verbal cues, and Handouts Education comprehension: verbalized understanding  HOME EXERCISE PROGRAM: Access Code: [email or web address] URL: [email or web address] Date: 10/27/2022 Prepared by: Stacie Glaze  Exercises - Seated March  - 2 x daily - 7 x weekly - 1 sets - 10 reps - 3 hold -  Seated Long Arc Quad  - 2 x daily - 7 x weekly - 1 sets - 10 reps - 3 hold - Seated Heel Raise  - 2 x daily - 7 x weekly - 1 sets - 10 reps - 3 hold - Seated Knee Flexion Stretch  - 1 x daily - 7 x weekly - 1 sets - 10 reps - 15 hold  ASSESSMENT:  CLINICAL IMPRESSION: Patient continues to struggle with the cane When we walk with the [facility] she uses it on the left side and does okay still creeps with furniture and walls, if she uses the cane in the right hand she really has the right hip go out into a trendelenberg pattern that is significant and she is much more unsteady. We went over HEP to help with hip strength.  She just will not use  it in her left hand as she says she does not feel right due to being right handed  OBJECTIVE IMPAIRMENTS: Abnormal gait, cardiopulmonary status limiting activity, decreased activity tolerance, decreased balance, decreased endurance, decreased mobility, difficulty walking, decreased ROM, decreased strength, increased edema, impaired flexibility, improper body mechanics, and pain.   REHAB POTENTIAL: Good  CLINICAL DECISION MAKING: Stable/uncomplicated  EVALUATION COMPLEXITY: Low   GOALS: Goals reviewed with patient? Yes  SHORT TERM GOALS: Target date: 11/11/22 Independent with initial HEP Goal status: met 11/08/22  LONG TERM GOALS: Target date: 01/27/23  Independent with advanced HEP Goal status: progressing 12/29/22  2.  Decrease pain 50% Goal status:progressing 03/07/23 exacerbation of right hip pain  3.  Increase AROM to 10-110 degrees flexion Goal status: 11/17/22 MET 5-120  4.  Go up and down stairs safely step over step with hand rails Goal status: Progressing 03/07/23, still struggles with this  5.  Decrease swelling by 4cm Goal status: met 01/12/23  6.  Decrease TUG to 24 seconds Goal status: Met  17.57 12/08/22   PLAN:  PT FREQUENCY: 1-2x/week  PT DURATION: 12 weeks  PLANNED INTERVENTIONS: Therapeutic exercises, Therapeutic activity,  Neuromuscular re-education, Balance training, Gait training, Patient/Family education, Self Care, Joint mobilization, Joint manipulation, Stair training, Electrical stimulation, Vasopneumatic device, and Manual therapy  PLAN FOR NEXT SESSION:  adding more hip strength   Jearld Lesch, PT

## 2023-03-14 ENCOUNTER — Encounter: Payer: Self-pay | Admitting: Physical Therapy

## 2023-03-14 ENCOUNTER — Ambulatory Visit: Payer: Medicare Other | Admitting: Physical Therapy

## 2023-03-14 DIAGNOSIS — R600 Localized edema: Secondary | ICD-10-CM

## 2023-03-14 DIAGNOSIS — M25562 Pain in left knee: Secondary | ICD-10-CM

## 2023-03-14 DIAGNOSIS — R262 Difficulty in walking, not elsewhere classified: Secondary | ICD-10-CM

## 2023-03-14 DIAGNOSIS — M25662 Stiffness of left knee, not elsewhere classified: Secondary | ICD-10-CM

## 2023-03-14 DIAGNOSIS — M6281 Muscle weakness (generalized): Secondary | ICD-10-CM

## 2023-03-14 NOTE — Therapy (Signed)
OUTPATIENT PHYSICAL THERAPY LOWER EXTREMITY TREATMENT    Patient Name: [name] MRN: [id number] DOB:1932/06/10, 89 y.o., female Today's Date: 03/14/2023  END OF SESSION:  PT End of Session - 03/14/23 1519     Visit Number 28    Date for PT Re-Evaluation 03/22/23    Authorization Type [employer] Medicare  6/6    PT Start Time 1520    PT Stop Time 1609    PT Time Calculation (min) 49 min    Activity Tolerance Patient tolerated treatment well    Behavior During Therapy WFL for tasks assessed/performed              Past Medical History:  Diagnosis Date   Acalculous cholecystitis 08/24/2018   Arthritis    DVT of lower extremity (deep venous thrombosis) (HCC)    right leg - after back surgery in 2019   GERD (gastroesophageal reflux disease)    History of blood transfusion    Hypothyroidism    PONV (postoperative nausea and vomiting)    Ptosis of both eyelids    Vertigo    Wears glasses    reading   Past Surgical History:  Procedure Laterality Date   ABDOMINAL HYSTERECTOMY  1986   APPLICATION OF ROBOTIC ASSISTANCE FOR SPINAL PROCEDURE N/A 07/17/2017   Procedure: APPLICATION OF ROBOTIC ASSISTANCE FOR SPINAL PROCEDURE;  Surgeon: Barnett Abu, MD;  Location: [facility] OR;  Service: Neurosurgery;  Laterality: N/A;   APPLICATION OF ROBOTIC ASSISTANCE FOR SPINAL PROCEDURE N/A 09/01/2017   Procedure: APPLICATION OF ROBOTIC ASSISTANCE FOR SPINAL PROCEDURE;  Surgeon: Barnett Abu, MD;  Location: [facility] OR;  Service: Neurosurgery;  Laterality: N/A;   APPLICATION OF ROBOTIC ASSISTANCE FOR SPINAL PROCEDURE N/A 10/27/2020   Procedure: APPLICATION OF ROBOTIC ASSISTANCE FOR SPINAL PROCEDURE;  Surgeon: Barnett Abu, MD;  Location: [facility] OR;  Service: Neurosurgery;  Laterality: N/A;   BACK SURGERY  1986   lumb lam   BREAST BIOPSY Right 2011   CARPAL TUNNEL RELEASE  2000   right   CARPAL TUNNEL RELEASE  2012   left   CHOLECYSTECTOMY N/A 08/24/2018   Procedure: LAPAROSCOPIC CHOLECYSTECTOMY  WITH INTRAOPERATIVE CHOLANGIOGRAM;  Surgeon: Claud Kelp, MD;  Location: [facility] OR;  Service: General;  Laterality: N/A;   COLONOSCOPY     CYSTOCELE REPAIR  2007   sling   DISTAL INTERPHALANGEAL JOINT FUSION Right 02/04/2014   Procedure: FUSION DISTAL INTERPHALANGEAL JOINT RIGHT INDEX FINGER ;  Surgeon: Cindee Salt, MD;  Location: [facility];  Service: Orthopedics;  Laterality: Right;   EYE SURGERY Bilateral    Cataract surgery with lens implant   KNEE SURGERY  2009,2011   partial knee   LAPAROSCOPY N/A 08/24/2018   Procedure: LAPAROSCOPY DIAGNOSTIC;  Surgeon: Claud Kelp, MD;  Location: [facility] OR;  Service: General;  Laterality: N/A;   LUMBAR PERCUTANEOUS PEDICLE SCREW 4 LEVEL N/A 10/27/2020   Procedure: Thoracic nine-ten Laminectomy with extension of fusion from Thoracic ten to Thoracic six with segmental fixation (cemented) with pedicle screw and mazor;  Surgeon: Barnett Abu, MD;  Location: [facility] OR;  Service: Neurosurgery;  Laterality: N/A;   OPEN REDUCTION INTERNAL FIXATION (ORIF) PROXIMAL PHALANX Left 08/30/2013   Procedure: OPEN REDUCTION INTERNAL FIXATION (ORIF) PROXIMAL PHALANX FRACTURE LEFT SMALL FINGER; SPLINT RING FINGER;  Surgeon: Nicki Reaper, MD;  Location: [facility];  Service: Orthopedics;  Laterality: Left;   POSTERIOR LUMBAR FUSION 4 LEVEL N/A 07/17/2017   Procedure: Thoracic Ten - Lumbar Five revison of hardware with Mazor;  Surgeon: Barnett Abu, MD;  Location: [facility] OR;  Service: Neurosurgery;  Laterality: N/A;  Thoracic/Lumbar   PTOSIS REPAIR Bilateral 07/27/2015   Procedure: PTOSIS REPAIR;  Surgeon: Louisa Second, MD;  Location: [facility];  Service: Plastics;  Laterality: Bilateral;   SHOULDER SURGERY     rt rcr,and lt   TENOLYSIS Left 05/21/2021   Procedure: RELEASE EXTENSOR POLLICIS LONGUS LEFT;  Surgeon: Cindee Salt, MD;  Location: [facility];  Service: Orthopedics;  Laterality: Left;   TENOLYSIS Left  06/08/2021   Procedure: RELEASE EXTENSOR POLLICIS LONGUS LEFT;  Surgeon: Cindee Salt, MD;  Location: [facility];  Service: Orthopedics;  Laterality: Left;   TONSILLECTOMY     TOTAL KNEE ARTHROPLASTY Right 12/23/2019   Procedure: TOTAL KNEE ARTHROPLASTY;  Surgeon: Dannielle Huh, MD;  Location: [facility];  Service: Orthopedics;  Laterality: Right;   TRIGGER FINGER RELEASE Left 05/21/2021   Procedure: RELEASE TRIGGER FINGER/A-1 PULLEY LEFT MIDDLE FINGER;  Surgeon: Cindee Salt, MD;  Location: [facility];  Service: Orthopedics;  Laterality: Left;   TRIGGER FINGER RELEASE Left 06/08/2021   Procedure: RELEASE A-1 PULLEY LEFT MIDDLE FINGER;  Surgeon: Cindee Salt, MD;  Location: [facility];  Service: Orthopedics;  Laterality: Left;   Patient Active Problem List   Diagnosis Date Noted   Myelopathy concurrent with and due to spinal stenosis of thoracic region (HCC) 10/27/2020   S/P total knee arthroplasty, right 12/23/2019   Acalculous cholecystitis 08/24/2018   Osteoarthritis of right knee    Vertigo    DDD (degenerative disc disease), cervical    Benign essential HTN    History of DVT (deep vein thrombosis)    Tachycardia    Steroid-induced hyperglycemia    Hypothyroidism    Neuropathic pain    Acute blood loss anemia    S/P lumbar fusion    Closed L5 vertebral fracture (HCC) 08/31/2017   Closed fracture of fifth lumbar vertebra (HCC) 08/30/2017   Lumbar vertebral fracture (HCC) 07/17/2017   Scoliosis 06/13/2017    PCP: Burton Apley, MD  REFERRING PROVIDER: Sherlean Foot, MD  REFERRING DIAG: S/P left TKA  THERAPY DIAG:  Stiffness of left knee, not elsewhere classified  Difficulty in walking, not elsewhere classified  Acute pain of left knee  Localized edema  Muscle weakness (generalized)  Rationale for Evaluation and Treatment: Rehabilitation  ONSET DATE: 10/17/22  SUBJECTIVE:   SUBJECTIVE STATEMENT: Patient reports that she is trying to  do more at home, sees MD in January, we have one more approved visit PERTINENT HISTORY: See above PAIN:  Are you having pain? Yes: NPRS scale: 4/10 Pain location: right hip Pain description: ache,sore deep Aggravating factors: unsure why the increased pain Relieving factors: massage helps, tylenol  PRECAUTIONS: None  RED FLAGS: None   WEIGHT BEARING RESTRICTIONS: No  FALLS:  Has patient fallen in last 6 months?yes had a fall 3 weeks ago, now with fear of walking  LIVING ENVIRONMENT: Lives with: lives with their family and lives with their spouse Lives in: House/apartment Stairs: Yes: Internal: 12 steps; can reach both Has following equipment at home: Environmental consultant - 4 wheeled, Shower bench, and bed side commode  OCCUPATION: retired  PLOF: Independent and was using a SPC, she was doing some cooking and cleaning  PATIENT GOALS: have good ROM, less pain, walk better, go up and down stairs  NEXT MD VISIT: 11/30/22  OBJECTIVE:   DIAGNOSTIC FINDINGS: s/p left TKA  PATIENT SURVEYS:  FOTO 6.5  COGNITION:  Overall cognitive status: Within functional limits for tasks assessed     SENSATION: WFL Numbness laterally  EDEMA:  Circumferential: right mid patella 46 cm, left 55 cm  PALPATION: Very swollen in the knee, the shin and the foot, very tender to palpation in the thigh, knee and shin, very sensitive where the tourniquet was  LOWER EXTREMITY ROM:  Active ROM Left AROM eval Left PROM eval Left AROM  Sitting 11/17/22 L knee AROM 12/08/22 Left knee AROM 01/26/23  Hip flexion       Hip extension       Hip abduction       Hip adduction       Hip internal rotation       Hip external rotation       Knee flexion 80 85 120 125 126  Knee extension 27 20 5 8 2   Ankle dorsiflexion       Ankle plantarflexion       Ankle inversion       Ankle eversion        (Blank rows = not tested)  LOWER EXTREMITY MMT:  MMT Right eval Left eval Left  01/26/23  Hip flexion     Hip  extension     Hip abduction     Hip adduction     Hip internal rotation     Hip external rotation     Knee flexion  3- 4-  Knee extension  2 3+  Ankle dorsiflexion     Ankle plantarflexion     Ankle inversion     Ankle eversion      (Blank rows = not tested) FUNCTIONAL TESTS:  Timed up and go (TUG): 51 seconds, 12/29/22 = 17 seconds, 01/26/23 18 seconds but is now limited by the right hip pain  GAIT: Distance walked: 50 feet Assistive device utilized: Walker - 2 wheeled Level of assistance: SBA Comments: slow and guarded, small steps   TODAY'S TREATMENT:                                                                                                                              DATE:  03/14/23 Nustep level 6 x 6 minutes Leg curls 25# 2x10 Leg extension 10# 2x10 Standing 5# straight arm pulls 5# hip extension 5# hip abduction very difficult did sets of 5 Gait with CGA using 2SPC x 200 feet Gait with one SPC x 200 feet CGA On airex weight shift, small marches with HHA Stnading reaching and head turns Green tband clamshells  03/07/23 Nustep level 5 x 6 minutes Leg curls 20# 2x10 Leg extension 20# 2x10 Gait 2 SPC's and SBA 200 feet Gait SPC 100' SBA x 3 Standing 5# straight arm pulls Standing ball toss On airex reaching and touching On airex head turns Red tband seated left hip abduction Ball b/n knees squeeze Side stepping  02/28/23 Gait with two SPC outside around half the back parking [place], [place] for  curbs 2lb alt box taps 8in box two SPC 2x10 Gait in clinic 2 SPC's x 100 feet x2, tried a little walking with SPC in the left hand and she does well, she does not like using the left hand, but in the right hand has the hip jutting out HS curls green tband S2S 2x10 slightly elevated mat  Side step lenfht of Mat table some HHA x1  Gait no AD 23ft x4 SBA to CGA Backwards walking no AD x10 ft   02/20/23 Gait with two SPC outside around the back parking [place], a few  standing rest breaks and CGA for curbs 2.5# marches, hip abduction and hip extension Green tband right hip adduction Green tband right hip abduction HS curls green tband Yellow tband around the ankles with ball b/n knees IR of the hips Gait in clinic 2 SPC's x 200 feet, tried a little walking with SPC in the left hand and she does well, she does not like using the left hand, but in the right hand has the hip jutting out  02/07/23 Nustep L 5 HS curls 25lb 2x15, Single leg 15lb x12 each Leg extension both legs 10# 2x10, single legs 5# 2x5  Ball b/n knees squeeze 20x S2S elevated mat table  2lb hip abd and ext w/ RW x12 each 2lb 6in alt taps with SPC and HHA 2x5, then without weight Gait SPC 65ft x2  01/31/23 Nustep L 5 LEg curls 25# 2x10, single legs 15# x10 each Leg extension both legs 10# x10, single legs 5# 2x5  Ball b/n knees squeeze 20x Green tband clamshells 2 sets 10 Green tband hip flexion 2 sets 10 Gait with SPC 6 inch box taps with [facility]   01/26/23 Nustep level 5 x 5 minutes MMT/ROM/TUG tests Step over step on 4" stairs, biggest issue is the right hip pain and weakness Gait with SPC and CGA/SBA in the clinic 110', then outside slopes and curbs with CGA and minA for curbs, also very unsure with thresholds LEg curls 20# 2x10, single legs 15# x10 each Leg extension both legs 5# x10, single legs 5# 2x5  Supine bridges SAQ 3# 3x10 Ball b/n knees squeeze Green tbnd clamshells  01/17/23 Nustep legs only level 5 x 6 minutes Leg curls 20# 2x10 Leg extension 5# 3 x10 Green tband clamshells Green tband ankle PF, red band DF 3# marches Passive hip stretches Active ankle exercises HHA side stepping  01/12/23 Nustep level 5 x 6 minutes Left knee green tband HS curls 3# left leg LAQ 3# marches Green tband Investment banker, corporate b/n knees squeeze 20# leg curls  5# leg extension Passive hip stretches    PATIENT EDUCATION:  Education details: HEP/POC Person  educated: Patient Education method: Programmer, multimedia, Facilities manager, Verbal cues, and Handouts Education comprehension: verbalized understanding  HOME EXERCISE PROGRAM: Access Code: [email or web address] URL: [email or web address] Date: 10/27/2022 Prepared by: Stacie Glaze  Exercises - Seated March  - 2 x daily - 7 x weekly - 1 sets - 10 reps - 3 hold - Seated Long Arc Quad  - 2 x daily - 7 x weekly - 1 sets - 10 reps - 3 hold - Seated Heel Raise  - 2 x daily - 7 x weekly - 1 sets - 10 reps - 3 hold - Seated Knee Flexion Stretch  - 1 x daily - 7 x weekly - 1 sets - 10 reps - 15 hold  ASSESSMENT:  CLINICAL IMPRESSION: Patient reports that she is doing some stairs  at home with the hand rail, she reports that she is doing well overall just very weak in the hips nad cannot do without the walker.  I have been trying to do some walking with canes, she tells me she has to use it in the right, but I got her doing it with the left today and is does well, just does not trust the left hand.  I did add some hip exercises today without issue but very weak  She just will not use it in her left hand as she says she does not feel right due to being right handed  OBJECTIVE IMPAIRMENTS: Abnormal gait, cardiopulmonary status limiting activity, decreased activity tolerance, decreased balance, decreased endurance, decreased mobility, difficulty walking, decreased ROM, decreased strength, increased edema, impaired flexibility, improper body mechanics, and pain.   REHAB POTENTIAL: Good  CLINICAL DECISION MAKING: Stable/uncomplicated  EVALUATION COMPLEXITY: Low   GOALS: Goals reviewed with patient? Yes  SHORT TERM GOALS: Target date: 11/11/22 Independent with initial HEP Goal status: met 11/08/22  LONG TERM GOALS: Target date: 01/27/23  Independent with advanced HEP Goal status: progressing 12/29/22  2.  Decrease pain 50% Goal status:progressing 03/07/23 exacerbation of right hip pain  3.  Increase  AROM to 10-110 degrees flexion Goal status: 11/17/22 MET 5-120  4.  Go up and down stairs safely step over step with hand rails Goal status: Progressing 03/07/23, still struggles with this  5.  Decrease swelling by 4cm Goal status: met 01/12/23  6.  Decrease TUG to 24 seconds Goal status: Met  17.57 12/08/22   PLAN:  PT FREQUENCY: 1-2x/week  PT DURATION: 12 weeks  PLANNED INTERVENTIONS: Therapeutic exercises, Therapeutic activity, Neuromuscular re-education, Balance training, Gait training, Patient/Family education, Self Care, Joint mobilization, Joint manipulation, Stair training, Electrical stimulation, Vasopneumatic device, and Manual therapy  PLAN FOR NEXT SESSION:  will need to assess and decide whether to d=continue next week   Jearld Lesch, PT

## 2023-03-21 ENCOUNTER — Ambulatory Visit: Payer: Medicare Other | Admitting: Physical Therapy

## 2023-03-21 ENCOUNTER — Encounter: Payer: Medicare Other | Admitting: Physical Therapy

## 2023-03-23 ENCOUNTER — Ambulatory Visit: Payer: Medicare Other | Admitting: Physical Therapy

## 2023-04-11 ENCOUNTER — Ambulatory Visit: Payer: Medicare Other | Admitting: Physical Therapy

## 2023-04-24 ENCOUNTER — Ambulatory Visit: Payer: Medicare Other | Admitting: Physical Therapy

## 2023-05-03 ENCOUNTER — Encounter: Payer: Self-pay | Admitting: Physical Therapy

## 2023-05-03 ENCOUNTER — Ambulatory Visit: Payer: Medicare Other | Attending: Orthopedic Surgery | Admitting: Physical Therapy

## 2023-05-03 DIAGNOSIS — R2681 Unsteadiness on feet: Secondary | ICD-10-CM | POA: Insufficient documentation

## 2023-05-03 DIAGNOSIS — M6281 Muscle weakness (generalized): Secondary | ICD-10-CM | POA: Diagnosis present

## 2023-05-03 DIAGNOSIS — M5459 Other low back pain: Secondary | ICD-10-CM | POA: Diagnosis present

## 2023-05-03 NOTE — Therapy (Signed)
OUTPATIENT PHYSICAL THERAPY LOWER EXTREMITY EVALUATION   Patient Name: [name] MRN: [id number] DOB:Oct 16, 1932, 89 y.o., female Today's Date: 05/03/2023  END OF SESSION:    Past Medical History:  Diagnosis Date   Acalculous cholecystitis 08/24/2018   Arthritis    DVT of lower extremity (deep venous thrombosis) (HCC)    right leg - after back surgery in 2019   GERD (gastroesophageal reflux disease)    History of blood transfusion    Hypothyroidism    PONV (postoperative nausea and vomiting)    Ptosis of both eyelids    Vertigo    Wears glasses    reading   Past Surgical History:  Procedure Laterality Date   ABDOMINAL HYSTERECTOMY  1986   APPLICATION OF ROBOTIC ASSISTANCE FOR SPINAL PROCEDURE N/A 07/17/2017   Procedure: APPLICATION OF ROBOTIC ASSISTANCE FOR SPINAL PROCEDURE;  Surgeon: Barnett Abu, MD;  [facility]: [facility];  Service: Neurosurgery;  Laterality: N/A;   APPLICATION OF ROBOTIC ASSISTANCE FOR SPINAL PROCEDURE N/A 09/01/2017   Procedure: APPLICATION OF ROBOTIC ASSISTANCE FOR SPINAL PROCEDURE;  Surgeon: Barnett Abu, MD;  [facility];  Service: Neurosurgery;  Laterality: N/A;   APPLICATION OF ROBOTIC ASSISTANCE FOR SPINAL PROCEDURE N/A 10/27/2020   Procedure: APPLICATION OF ROBOTIC ASSISTANCE FOR SPINAL PROCEDURE;  Surgeon: Barnett Abu, MD;  [facility];  Service: Neurosurgery;  Laterality: N/A;   BACK SURGERY  1986   lumb lam   BREAST BIOPSY Right 2011   CARPAL TUNNEL RELEASE  2000   right   CARPAL TUNNEL RELEASE  2012   left   CHOLECYSTECTOMY N/A 08/24/2018   Procedure: LAPAROSCOPIC CHOLECYSTECTOMY WITH INTRAOPERATIVE CHOLANGIOGRAM;  Surgeon: Claud Kelp, MD;  Location: [facility] OR;  Service: General;  Laterality: N/A;   COLONOSCOPY     CYSTOCELE REPAIR  2007   sling   DISTAL INTERPHALANGEAL JOINT FUSION Right 02/04/2014   Procedure: FUSION DISTAL INTERPHALANGEAL JOINT RIGHT INDEX FINGER ;  Surgeon: Cindee Salt, MD;  Location: [facility];  Service: Orthopedics;  Laterality: Right;   EYE SURGERY Bilateral    Cataract surgery with lens implant   KNEE SURGERY  2009,2011   partial knee   LAPAROSCOPY N/A 08/24/2018   Procedure: LAPAROSCOPY DIAGNOSTIC;  Surgeon: Claud Kelp, MD;  Location: [facility] OR;  Service: General;  Laterality: N/A;   LUMBAR PERCUTANEOUS PEDICLE SCREW 4 LEVEL N/A 10/27/2020   Procedure: Thoracic nine-ten Laminectomy with extension of fusion from Thoracic ten to Thoracic six with segmental fixation (cemented) with pedicle screw and mazor;  Surgeon: Barnett Abu, MD;  Location: [facility];  Service: Neurosurgery;  Laterality: N/A;   OPEN REDUCTION INTERNAL FIXATION (ORIF) PROXIMAL PHALANX Left 08/30/2013   Procedure: OPEN REDUCTION INTERNAL FIXATION (ORIF) PROXIMAL PHALANX FRACTURE LEFT SMALL FINGER; SPLINT RING FINGER;  Surgeon: Nicki Reaper, MD;  Location: [facility];  Service: Orthopedics;  Laterality: Left;   POSTERIOR LUMBAR FUSION 4 LEVEL N/A 07/17/2017   Procedure: Thoracic Ten - Lumbar Five revison of hardware with Mazor;  Surgeon: Barnett Abu, MD;  [facility];  Service: Neurosurgery;  Laterality: N/A;  Thoracic/Lumbar   PTOSIS REPAIR Bilateral 07/27/2015   Procedure: PTOSIS REPAIR;  Surgeon: Louisa Second, MD;  Location: [facility];  Service: Plastics;  Laterality: Bilateral;   SHOULDER SURGERY     rt rcr,and lt   TENOLYSIS Left 05/21/2021   Procedure: RELEASE EXTENSOR POLLICIS LONGUS LEFT;  Surgeon: Cindee Salt, MD;  Location:  SURGERY CENTER;  Service: Orthopedics;  Laterality: Left;  TENOLYSIS Left 06/08/2021   Procedure: RELEASE EXTENSOR POLLICIS LONGUS LEFT;  Surgeon: Cindee Salt, MD;  Location: [facility];  Service: Orthopedics;  Laterality: Left;   TONSILLECTOMY     TOTAL KNEE ARTHROPLASTY Right 12/23/2019   Procedure: TOTAL KNEE ARTHROPLASTY;  Surgeon: Dannielle Huh, MD;  Location: [facility];  Service: Orthopedics;   Laterality: Right;   TRIGGER FINGER RELEASE Left 05/21/2021   Procedure: RELEASE TRIGGER FINGER/A-1 PULLEY LEFT MIDDLE FINGER;  Surgeon: Cindee Salt, MD;  Location: [facility];  Service: Orthopedics;  Laterality: Left;   TRIGGER FINGER RELEASE Left 06/08/2021   Procedure: RELEASE A-1 PULLEY LEFT MIDDLE FINGER;  Surgeon: Cindee Salt, MD;  Location: [facility];  Service: Orthopedics;  Laterality: Left;   Patient Active Problem List   Diagnosis Date Noted   Myelopathy concurrent with and due to spinal stenosis of thoracic region (HCC) 10/27/2020   S/P total knee arthroplasty, right 12/23/2019   Acalculous cholecystitis 08/24/2018   Osteoarthritis of right knee    Vertigo    DDD (degenerative disc disease), cervical    Benign essential HTN    History of DVT (deep vein thrombosis)    Tachycardia    Steroid-induced hyperglycemia    Hypothyroidism    Neuropathic pain    Acute blood loss anemia    S/P lumbar fusion    Closed L5 vertebral fracture (HCC) 08/31/2017   Closed fracture of fifth lumbar vertebra (HCC) 08/30/2017   Lumbar vertebral fracture (HCC) 07/17/2017   Scoliosis 06/13/2017    PCP: Burton Apley, MD  REFERRING PROVIDER: Sherlean Foot, MD  REFERRING DIAG: S/P left TKA  THERAPY DIAG:  No diagnosis found.  Rationale for Evaluation and Treatment: Rehabilitation  ONSET DATE: 10/17/22  SUBJECTIVE:   SUBJECTIVE STATEMENT: Patient has had some ups and downs, had a left TKA in July 2024, she did great as far as the knee, she has had extensive surgical history of her back, fused from the thoracic to the pelvic area.  She reports that she had an episode of severe right hip pain that she could not walk, MD note to me reports he feels there was some nerve impingement, recent radiographic studies are negative, feels may need nerve flossing and strength and balance  PERTINENT HISTORY: See above PAIN:  Are you having pain? Yes: NPRS scale: 3/10 Pain  location: back and right leg pain Pain description: ache, deep, feels like a nerve Aggravating factors: night, bending, being up on the leg pain a 10/10 Relieving factors: sit down, elevate, pain medication, ice pain can be 0/10  PRECAUTIONS: None  RED FLAGS: None   WEIGHT BEARING RESTRICTIONS: No  FALLS:  Has patient fallen in last 6 months? Yes, 1 falls onto bed  LIVING ENVIRONMENT: Lives with: lives with their family and lives with their spouse Lives in: House/apartment Stairs: Yes: Internal: 12 steps; can reach both Has following equipment at home: Environmental consultant - 2 wheeled, Shower bench, and bed side commode  OCCUPATION: retired  PLOF: Independent and was using a SPC, she was doing some cooking and cleaning, has stairs that she goes up and down to do laundry  PATIENT GOALS: have good ROM, less pain, walk better, go up and down stairs, feel sturdier  NEXT MD VISIT: none  OBJECTIVE:   DIAGNOSTIC FINDINGS: MD feels like the back is normal after surgery  COGNITION: Overall cognitive status: Within functional limits for tasks assessed     SENSATION: WFL  PALPATION: Very tender in the right  hip and lateral ITB, some tightness and tenderness in the low back  LOWER EXTREMITY ROM:  Active ROM Right AROM eval Left  AROM eval  Hip flexion 70 70  Hip extension 5 5  Hip abduction 5 5  Hip adduction    Hip internal rotation    Hip external rotation    Knee flexion    Knee extension    Ankle dorsiflexion 3   Ankle plantarflexion 40   Ankle inversion 10   Ankle eversion 4    (Blank rows = not tested)  LOWER EXTREMITY MMT:  MMT Right eval Left eval  Hip flexion 3+ 4-  Hip extension    Hip abduction 4- 4-  Hip adduction 4- 4-  Hip internal rotation    Hip external rotation    Knee flexion 4 4  Knee extension 4 4  Ankle dorsiflexion 3+in ROM 4-  Ankle plantarflexion 4in ROM 4  Ankle inversion 3+in ROM 4-  Ankle eversion 3+in ROM 4-   (Blank rows = not  tested) FUNCTIONAL TESTS:  5 times sit to stand: 36 seconds really struggled needed to start with hands but then was able to do with the backs of the legs on the chair Timed up and go (TUG): 21 seconds with FWW 3 minute walk test: with FWW 280 feet fatigue  GAIT: Distance walked: 280 feet Assistive device utilized: Walker - 2 wheeled Level of assistance: SBA Comments: slow and guarded, small steps   TODAY'S TREATMENT:                                                                                                                              DATE:     PATIENT EDUCATION:  Education details: HEP/POC Person educated: Patient Education method: Programmer, multimedia, Facilities manager, Verbal cues, and Handouts Education comprehension: verbalized understanding  HOME EXERCISE PROGRAM: Access Code: [email or web address] URL: [email or web address] Date: 05/03/2023 Prepared by: Stacie Glaze  Exercises - Seated Calf Towel Stretch  - 2 x daily - 7 x weekly - 1 sets - 5 reps - 30 hold - Seated March  - 2 x daily - 7 x weekly - 1 sets - 10 reps - 3 hold - Seated Long Arc Quad  - 2 x daily - 7 x weekly - 1 sets - 10 reps - 3 hold - Seated Heel Raise  - 2 x daily - 7 x weekly - 1 sets - 10 reps - 3 hold - Seated Toe Raise  - 2 x daily - 7 x weekly - 1 sets - 10 reps - 3 hold  ASSESSMENT:  CLINICAL IMPRESSION: Patient is a 89 y.o. female who was seen today for physical therapy evaluation and treatment for LBP, right LE radiculopathy, weakness in the legs, difficulty walking and unsteady gait.  Functional test scores above demonstrate difficulty with mobility and is a fall risk.  She has lost strength from when I saw her last year.    OBJECTIVE  IMPAIRMENTS: Abnormal gait, cardiopulmonary status limiting activity, decreased activity tolerance, decreased balance, decreased endurance, decreased mobility, difficulty walking, decreased ROM, decreased strength, increased edema, impaired flexibility, improper  body mechanics, and pain.   REHAB POTENTIAL: Good  CLINICAL DECISION MAKING: Stable/uncomplicated  EVALUATION COMPLEXITY: Low   GOALS: Goals reviewed with patient? Yes  SHORT TERM GOALS: Target date: 05/31/23 Independent with initial HEP Goal status: INITIAL  LONG TERM GOALS: Target date: 08/01/23  Independent with advanced HEP Goal status: INITIAL  2.  Decrease pain 50% Goal status: INITIAL  3.  Increase AROM of the left hip to 90 degrees in standing Goal status: INITIAL  4.  Go up and down stairs safely step over step with hand rails Goal status: INITIAL at evaluation reported that she has really struggled and does one at a time and has had to get some help due to laundry in basement  5.  Decrease TUG time to 15 seconds Goal status: INITIAL at evaluation 21 seconds  6.  Decrease 5XSTS to 20 seconds Goal status: INITIAL at evaluation 36 seconds   PLAN:  PT FREQUENCY: 1-2x/week  PT DURATION: 12 weeks  PLANNED INTERVENTIONS: Therapeutic exercises, Therapeutic activity, Neuromuscular re-education, Balance training, Gait training, Patient/Family education, Self Care, Joint mobilization, Joint manipulation, Stair training, Electrical stimulation, Vasopneumatic device, and Manual therapy  PLAN FOR NEXT SESSION: slowly start activities for strength, ROM, function and gait, balance   Ryn Peine W, PT 05/03/2023, 1:02 PM

## 2023-05-10 ENCOUNTER — Ambulatory Visit: Payer: Medicare Other | Attending: Orthopedic Surgery | Admitting: Physical Therapy

## 2023-05-10 ENCOUNTER — Encounter: Payer: Self-pay | Admitting: Physical Therapy

## 2023-05-10 DIAGNOSIS — M62830 Muscle spasm of back: Secondary | ICD-10-CM | POA: Insufficient documentation

## 2023-05-10 DIAGNOSIS — R2681 Unsteadiness on feet: Secondary | ICD-10-CM | POA: Diagnosis present

## 2023-05-10 DIAGNOSIS — R262 Difficulty in walking, not elsewhere classified: Secondary | ICD-10-CM | POA: Insufficient documentation

## 2023-05-10 DIAGNOSIS — M6281 Muscle weakness (generalized): Secondary | ICD-10-CM | POA: Insufficient documentation

## 2023-05-10 DIAGNOSIS — M5459 Other low back pain: Secondary | ICD-10-CM | POA: Diagnosis present

## 2023-05-10 NOTE — Therapy (Signed)
 OUTPATIENT PHYSICAL THERAPY LOWER EXTREMITY TREATMENT   Patient Name: [name] MRN: [id number] DOB:06/04/1932, 89 y.o., female Today's Date: 05/10/2023  END OF SESSION:  PT End of Session - 05/10/23 1143     Visit Number 2    Date for PT Re-Evaluation 08/01/23    PT Start Time 1145    PT Stop Time 1230    PT Time Calculation (min) 45 min    Activity Tolerance Patient tolerated treatment well    Behavior During Therapy [facility] for tasks assessed/performed              Past Medical History:  Diagnosis Date   Acalculous cholecystitis 08/24/2018   Arthritis    DVT of lower extremity (deep venous thrombosis) (HCC)    right leg - after back surgery in 2019   GERD (gastroesophageal reflux disease)    History of blood transfusion    Hypothyroidism    PONV (postoperative nausea and vomiting)    Ptosis of both eyelids    Vertigo    Wears glasses    reading   Past Surgical History:  Procedure Laterality Date   ABDOMINAL HYSTERECTOMY  1986   APPLICATION OF ROBOTIC ASSISTANCE FOR SPINAL PROCEDURE N/A 07/17/2017   Procedure: APPLICATION OF ROBOTIC ASSISTANCE FOR SPINAL PROCEDURE;  Surgeon: Colon Shove, MD;  [facility];  Service: Neurosurgery;  Laterality: N/A;   APPLICATION OF ROBOTIC ASSISTANCE FOR SPINAL PROCEDURE N/A 09/01/2017   Procedure: APPLICATION OF ROBOTIC ASSISTANCE FOR SPINAL PROCEDURE;  Surgeon: Colon Shove, MD;  [facility];  Service: Neurosurgery;  Laterality: N/A;   APPLICATION OF ROBOTIC ASSISTANCE FOR SPINAL PROCEDURE N/A 10/27/2020   Procedure: APPLICATION OF ROBOTIC ASSISTANCE FOR SPINAL PROCEDURE;  Surgeon: Colon Shove, MD;  [facility];  Service: Neurosurgery;  Laterality: N/A;   BACK SURGERY  1986   lumb lam   BREAST BIOPSY Right 2011   CARPAL TUNNEL RELEASE  2000   right   CARPAL TUNNEL RELEASE  2012   left   CHOLECYSTECTOMY N/A 08/24/2018   Procedure: LAPAROSCOPIC CHOLECYSTECTOMY WITH INTRAOPERATIVE CHOLANGIOGRAM;  Surgeon:  Gail Favorite, MD;  Location: [facility] OR;  Service: General;  Laterality: N/A;   COLONOSCOPY     CYSTOCELE REPAIR  2007   sling   DISTAL INTERPHALANGEAL JOINT FUSION Right 02/04/2014   Procedure: FUSION DISTAL INTERPHALANGEAL JOINT RIGHT INDEX FINGER ;  Surgeon: Arley Curia, MD;  Location: [facility];  Service: Orthopedics;  Laterality: Right;   EYE SURGERY Bilateral    Cataract surgery with lens implant   KNEE SURGERY  2009,2011   partial knee   LAPAROSCOPY N/A 08/24/2018   Procedure: LAPAROSCOPY DIAGNOSTIC;  Surgeon: Gail Favorite, MD;  Location: [facility] OR;  Service: General;  Laterality: N/A;   LUMBAR PERCUTANEOUS PEDICLE SCREW 4 LEVEL N/A 10/27/2020   Procedure: Thoracic nine-ten Laminectomy with extension of fusion from Thoracic ten to Thoracic six with segmental fixation (cemented) with pedicle screw and mazor;  Surgeon: Colon Shove, MD;  Location: [facility] OR;  Service: Neurosurgery;  Laterality: N/A;   OPEN REDUCTION INTERNAL FIXATION (ORIF) PROXIMAL PHALANX Left 08/30/2013   Procedure: OPEN REDUCTION INTERNAL FIXATION (ORIF) PROXIMAL PHALANX FRACTURE LEFT SMALL FINGER; SPLINT RING FINGER;  Surgeon: Arley JONELLE Curia, MD;  Location: [facility];  Service: Orthopedics;  Laterality: Left;   POSTERIOR LUMBAR FUSION 4 LEVEL N/A 07/17/2017   Procedure: Thoracic Ten - Lumbar Five revison of hardware with Mazor;  Surgeon: Colon Shove, MD;  Location: [facility];  Service:  Neurosurgery;  Laterality: N/A;  Thoracic/Lumbar   PTOSIS REPAIR Bilateral 07/27/2015   Procedure: PTOSIS REPAIR;  Surgeon: Elna Pick, MD;  Location: [facility];  Service: Plastics;  Laterality: Bilateral;   SHOULDER SURGERY     rt rcr,and lt   TENOLYSIS Left 05/21/2021   Procedure: RELEASE EXTENSOR POLLICIS LONGUS LEFT;  Surgeon: Murrell Kuba, MD;  Location: [facility];  Service: Orthopedics;  Laterality: Left;   TENOLYSIS Left 06/08/2021   Procedure: RELEASE EXTENSOR POLLICIS  LONGUS LEFT;  Surgeon: Murrell Kuba, MD;  Location: [facility];  Service: Orthopedics;  Laterality: Left;   TONSILLECTOMY     TOTAL KNEE ARTHROPLASTY Right 12/23/2019   Procedure: TOTAL KNEE ARTHROPLASTY;  Surgeon: Rubie Kemps, MD;  Location: [facility];  Service: Orthopedics;  Laterality: Right;   TRIGGER FINGER RELEASE Left 05/21/2021   Procedure: RELEASE TRIGGER FINGER/A-1 PULLEY LEFT MIDDLE FINGER;  Surgeon: Murrell Kuba, MD;  Location: [facility];  Service: Orthopedics;  Laterality: Left;   TRIGGER FINGER RELEASE Left 06/08/2021   Procedure: RELEASE A-1 PULLEY LEFT MIDDLE FINGER;  Surgeon: Murrell Kuba, MD;  Location: [facility];  Service: Orthopedics;  Laterality: Left;   Patient Active Problem List   Diagnosis Date Noted   Myelopathy concurrent with and due to spinal stenosis of thoracic region (HCC) 10/27/2020   S/P total knee arthroplasty, right 12/23/2019   Acalculous cholecystitis 08/24/2018   Osteoarthritis of right knee    Vertigo    DDD (degenerative disc disease), cervical    Benign essential HTN    History of DVT (deep vein thrombosis)    Tachycardia    Steroid-induced hyperglycemia    Hypothyroidism    Neuropathic pain    Acute blood loss anemia    S/P lumbar fusion    Closed L5 vertebral fracture (HCC) 08/31/2017   Closed fracture of fifth lumbar vertebra (HCC) 08/30/2017   Lumbar vertebral fracture (HCC) 07/17/2017   Scoliosis 06/13/2017    PCP: Tanda Rummer, MD  REFERRING PROVIDER: Rubie, MD  REFERRING DIAG: S/P left TKA  THERAPY DIAG:  Other low back pain  Muscle weakness (generalized)  Unsteady gait  Difficulty in walking, not elsewhere classified  Rationale for Evaluation and Treatment: Rehabilitation  ONSET DATE: 10/17/22  SUBJECTIVE:   SUBJECTIVE STATEMENT: Pretty good, hip is better, legs form the knee down feel stiff and weak  PERTINENT HISTORY: See above PAIN:  Are you having pain? Yes: NPRS  scale: 2/10 Pain location: back and right leg pain Pain description: ache, deep, feels like a nerve Aggravating factors: night, bending, being up on the leg pain a 10/10 Relieving factors: sit down, elevate, pain medication, ice pain can be 0/10  PRECAUTIONS: None  RED FLAGS: None   WEIGHT BEARING RESTRICTIONS: No  FALLS:  Has patient fallen in last 6 months? Yes, 1 falls onto bed  LIVING ENVIRONMENT: Lives with: lives with their family and lives with their spouse Lives in: House/apartment Stairs: Yes: Internal: 12 steps; can reach both Has following equipment at home: Walker - 2 wheeled, Shower bench, and bed side commode  OCCUPATION: retired  PLOF: Independent and was using a SPC, she was doing some cooking and cleaning, has stairs that she goes up and down to do laundry  PATIENT GOALS: have good ROM, less pain, walk better, go up and down stairs, feel sturdier  NEXT MD VISIT: none  OBJECTIVE:   DIAGNOSTIC FINDINGS: MD feels like the back is normal after surgery  COGNITION: Overall cognitive status: Within functional limits for tasks assessed     SENSATION: WFL  PALPATION: Very tender in the right hip and lateral ITB, some tightness and tenderness in the low back  LOWER EXTREMITY ROM:  Active ROM Right AROM eval Left  AROM eval  Hip flexion 70 70  Hip extension 5 5  Hip abduction 5 5  Hip adduction    Hip internal rotation    Hip external rotation    Knee flexion    Knee extension    Ankle dorsiflexion 3   Ankle plantarflexion 40   Ankle inversion 10   Ankle eversion 4    (Blank rows = not tested)  LOWER EXTREMITY MMT:  MMT Right eval Left eval  Hip flexion 3+ 4-  Hip extension    Hip abduction 4- 4-  Hip adduction 4- 4-  Hip internal rotation    Hip external rotation    Knee flexion 4 4  Knee extension 4 4  Ankle dorsiflexion 3+in ROM 4-  Ankle plantarflexion 4in ROM 4  Ankle inversion 3+in ROM 4-  Ankle eversion 3+in ROM 4-   (Blank  rows = not tested) FUNCTIONAL TESTS:  5 times sit to stand: 36 seconds really struggled needed to start with hands but then was able to do with the backs of the legs on the chair Timed up and go (TUG): 21 seconds with FWW 3 minute walk test: with FWW 280 feet fatigue  GAIT: Distance walked: 280 feet Assistive device utilized: Walker - 2 wheeled Level of assistance: SBA Comments: slow and guarded, small steps   TODAY'S TREATMENT:                                                                                                                              DATE:   05/10/23 NuStep L 4 x 6 min LE only  Hip add ball squeeze 2x10 Hip abd green Tband 2x10 Heel raises in RW 2x10 Resisted ankle PF/DF  red 2x10 each S2S 2x10 Bridges x10   R hip PROM with end range holds for HS, piriformis, Single K2C, ITB Standing hip Abd ans Ext w/ RW 2x10  PATIENT EDUCATION:  Education details: HEP/POC Person educated: Patient Education method: Programmer, Multimedia, Facilities Manager, Verbal cues, and Handouts Education comprehension: verbalized understanding  HOME EXERCISE PROGRAM: Access Code: [email or web address] URL: [email or web address] Date: 05/03/2023 Prepared by: Ozell Mainland  Exercises - Seated Calf Towel Stretch  - 2 x daily - 7 x weekly - 1 sets - 5 reps - 30 hold - Seated March  - 2 x daily - 7 x weekly - 1 sets - 10 reps - 3 hold - Seated Long Arc Quad  - 2 x daily - 7 x weekly - 1 sets - 10 reps - 3 hold - Seated Heel Raise  - 2 x daily - 7 x weekly - 1 sets - 10 reps - 3 hold - Seated Toe Raise  -  2 x daily - 7 x weekly - 1 sets - 10 reps - 3 hold  ASSESSMENT:  CLINICAL IMPRESSION: Patient is a 89 y.o. female who was seen today for physical therapy d treatment for LBP, right LE radiculopathy, weakness in the legs, difficulty walking and unsteady gait.  Pt tolerated an initial progression to TE well evident by no subjective reports of increase pain. All interventions completed well. She doe  demonstrate some functional weakness with sit to stands. Weakness also present with L ankle DF. Pt had a difficult time relaxing with stretching. She is very thight in the R piriformis muscle.  OBJECTIVE IMPAIRMENTS: Abnormal gait, cardiopulmonary status limiting activity, decreased activity tolerance, decreased balance, decreased endurance, decreased mobility, difficulty walking, decreased ROM, decreased strength, increased edema, impaired flexibility, improper body mechanics, and pain.   REHAB POTENTIAL: Good  CLINICAL DECISION MAKING: Stable/uncomplicated  EVALUATION COMPLEXITY: Low   GOALS: Goals reviewed with patient? Yes  SHORT TERM GOALS: Target date: 05/31/23 Independent with initial HEP Goal status: INITIAL  LONG TERM GOALS: Target date: 08/01/23  Independent with advanced HEP Goal status: INITIAL  2.  Decrease pain 50% Goal status: INITIAL  3.  Increase AROM of the left hip to 90 degrees in standing Goal status: INITIAL  4.  Go up and down stairs safely step over step with hand rails Goal status: INITIAL at evaluation reported that she has really struggled and does one at a time and has had to get some help due to laundry in basement  5.  Decrease TUG time to 15 seconds Goal status: INITIAL at evaluation 21 seconds  6.  Decrease 5XSTS to 20 seconds Goal status: INITIAL at evaluation 36 seconds   PLAN:  PT FREQUENCY: 1-2x/week  PT DURATION: 12 weeks  PLANNED INTERVENTIONS: Therapeutic exercises, Therapeutic activity, Neuromuscular re-education, Balance training, Gait training, Patient/Family education, Self Care, Joint mobilization, Joint manipulation, Stair training, Electrical stimulation, Vasopneumatic device, and Manual therapy  PLAN FOR NEXT SESSION: slowly start activities for strength, ROM, function and gait, balance   Tanda KANDICE Sorrow, PTA 05/10/2023, 11:44 AM

## 2023-05-17 ENCOUNTER — Ambulatory Visit: Payer: Medicare Other | Admitting: Physical Therapy

## 2023-05-17 ENCOUNTER — Encounter: Payer: Self-pay | Admitting: Physical Therapy

## 2023-05-17 DIAGNOSIS — R262 Difficulty in walking, not elsewhere classified: Secondary | ICD-10-CM

## 2023-05-17 DIAGNOSIS — M5459 Other low back pain: Secondary | ICD-10-CM

## 2023-05-17 DIAGNOSIS — M6281 Muscle weakness (generalized): Secondary | ICD-10-CM

## 2023-05-17 DIAGNOSIS — R2681 Unsteadiness on feet: Secondary | ICD-10-CM

## 2023-05-17 NOTE — Therapy (Signed)
OUTPATIENT PHYSICAL THERAPY LOWER EXTREMITY TREATMENT   Patient Name: [name] MRN: [id number] DOB:1932/11/13, 89 y.o., female Today's Date: 05/17/2023  END OF SESSION:  PT End of Session - 05/17/23 1518     Visit Number 3    Date for PT Re-Evaluation 08/01/23    PT Start Time 1515    PT Stop Time 1600    PT Time Calculation (min) 45 min    Activity Tolerance Patient tolerated treatment well    Behavior During Therapy [facility] for tasks assessed/performed              Past Medical History:  Diagnosis Date   Acalculous cholecystitis 08/24/2018   Arthritis    DVT of lower extremity (deep venous thrombosis) (HCC)    right leg - after back surgery in 2019   GERD (gastroesophageal reflux disease)    History of blood transfusion    Hypothyroidism    PONV (postoperative nausea and vomiting)    Ptosis of both eyelids    Vertigo    Wears glasses    reading   Past Surgical History:  Procedure Laterality Date   ABDOMINAL HYSTERECTOMY  1986   APPLICATION OF ROBOTIC ASSISTANCE FOR SPINAL PROCEDURE N/A 07/17/2017   Procedure: APPLICATION OF ROBOTIC ASSISTANCE FOR SPINAL PROCEDURE;  Surgeon: Barnett Abu, MD;  Location: [facility] OR;  Service: Neurosurgery;  Laterality: N/A;   APPLICATION OF ROBOTIC ASSISTANCE FOR SPINAL PROCEDURE N/A 09/01/2017   Procedure: APPLICATION OF ROBOTIC ASSISTANCE FOR SPINAL PROCEDURE;  Surgeon: Barnett Abu, MD;  Location: [facility] OR;  Service: Neurosurgery;  Laterality: N/A;   APPLICATION OF ROBOTIC ASSISTANCE FOR SPINAL PROCEDURE N/A 10/27/2020   Procedure: APPLICATION OF ROBOTIC ASSISTANCE FOR SPINAL PROCEDURE;  Surgeon: Barnett Abu, MD;  [facility];  Service: Neurosurgery;  Laterality: N/A;   BACK SURGERY  1986   lumb lam   BREAST BIOPSY Right 2011   CARPAL TUNNEL RELEASE  2000   right   CARPAL TUNNEL RELEASE  2012   left   CHOLECYSTECTOMY N/A 08/24/2018   Procedure: LAPAROSCOPIC CHOLECYSTECTOMY WITH INTRAOPERATIVE CHOLANGIOGRAM;  Surgeon:  Claud Kelp, MD;  Location[facility] OR;  Service: General;  Laterality: N/A;   COLONOSCOPY     CYSTOCELE REPAIR  2007   sling   DISTAL INTERPHALANGEAL JOINT FUSION Right 02/04/2014   Procedure: FUSION DISTAL INTERPHALANGEAL JOINT RIGHT INDEX FINGER ;  Surgeon: Cindee Salt, MD;  Location: [facility];  Service: Orthopedics;  Laterality: Right;   EYE SURGERY Bilateral    Cataract surgery with lens implant   KNEE SURGERY  2009,2011   partial knee   LAPAROSCOPY N/A 08/24/2018   Procedure: LAPAROSCOPY DIAGNOSTIC;  Surgeon: Claud Kelp, MD;  Location: [facility] OR;  Service: General;  Laterality: N/A;   LUMBAR PERCUTANEOUS PEDICLE SCREW 4 LEVEL N/A 10/27/2020   Procedure: Thoracic nine-ten Laminectomy with extension of fusion from Thoracic ten to Thoracic six with segmental fixation (cemented) with pedicle screw and mazor;  Surgeon: Barnett Abu, MD;  Location: [facility] OR;  Service: Neurosurgery;  Laterality: N/A;   OPEN REDUCTION INTERNAL FIXATION (ORIF) PROXIMAL PHALANX Left 08/30/2013   Procedure: OPEN REDUCTION INTERNAL FIXATION (ORIF) PROXIMAL PHALANX FRACTURE LEFT SMALL FINGER; SPLINT RING FINGER;  Surgeon: Nicki Reaper, MD;  Location: [facility];  Service: Orthopedics;  Laterality: Left;   POSTERIOR LUMBAR FUSION 4 LEVEL N/A 07/17/2017   Procedure: Thoracic Ten - Lumbar Five revison of hardware with Mazor;  Surgeon: Barnett Abu, MD;  [facility]: [facility];  Service:  Neurosurgery;  Laterality: N/A;  Thoracic/Lumbar   PTOSIS REPAIR Bilateral 07/27/2015   Procedure: PTOSIS REPAIR;  Surgeon: Louisa Second, MD;  Location: [facility];  Service: Plastics;  Laterality: Bilateral;   SHOULDER SURGERY     rt rcr,and lt   TENOLYSIS Left 05/21/2021   Procedure: RELEASE EXTENSOR POLLICIS LONGUS LEFT;  Surgeon: Cindee Salt, MD;  Location: [facility];  Service: Orthopedics;  Laterality: Left;   TENOLYSIS Left 06/08/2021   Procedure: RELEASE EXTENSOR POLLICIS  LONGUS LEFT;  Surgeon: Cindee Salt, MD;  Location: [facility];  Service: Orthopedics;  Laterality: Left;   TONSILLECTOMY     TOTAL KNEE ARTHROPLASTY Right 12/23/2019   Procedure: TOTAL KNEE ARTHROPLASTY;  Surgeon: Dannielle Huh, MD;  Location: [facility];  Service: Orthopedics;  Laterality: Right;   TRIGGER FINGER RELEASE Left 05/21/2021   Procedure: RELEASE TRIGGER FINGER/A-1 PULLEY LEFT MIDDLE FINGER;  Surgeon: Cindee Salt, MD;  Location: [facility];  Service: Orthopedics;  Laterality: Left;   TRIGGER FINGER RELEASE Left 06/08/2021   Procedure: RELEASE A-1 PULLEY LEFT MIDDLE FINGER;  Surgeon: Cindee Salt, MD;  Location: [facility];  Service: Orthopedics;  Laterality: Left;   Patient Active Problem List   Diagnosis Date Noted   Myelopathy concurrent with and due to spinal stenosis of thoracic region (HCC) 10/27/2020   S/P total knee arthroplasty, right 12/23/2019   Acalculous cholecystitis 08/24/2018   Osteoarthritis of right knee    Vertigo    DDD (degenerative disc disease), cervical    Benign essential HTN    History of DVT (deep vein thrombosis)    Tachycardia    Steroid-induced hyperglycemia    Hypothyroidism    Neuropathic pain    Acute blood loss anemia    S/P lumbar fusion    Closed L5 vertebral fracture (HCC) 08/31/2017   Closed fracture of fifth lumbar vertebra (HCC) 08/30/2017   Lumbar vertebral fracture (HCC) 07/17/2017   Scoliosis 06/13/2017    PCP: Burton Apley, MD  REFERRING PROVIDER: Sherlean Foot, MD  REFERRING DIAG: S/P left TKA  THERAPY DIAG:  Other low back pain  Muscle weakness (generalized)  Unsteady gait  Difficulty in walking, not elsewhere classified  Rationale for Evaluation and Treatment: Rehabilitation  ONSET DATE: 10/17/22  SUBJECTIVE:   SUBJECTIVE STATEMENT: " Feel pretty good" "Been up doing a lot of work"  PERTINENT HISTORY: See above PAIN:  Are you having pain? Yes: NPRS scale: 0/10 Pain  location: back and right leg pain Pain description: ache, deep, feels like a nerve Aggravating factors: night, bending, being up on the leg pain a 10/10 Relieving factors: sit down, elevate, pain medication, ice pain can be 0/10  PRECAUTIONS: None  RED FLAGS: None   WEIGHT BEARING RESTRICTIONS: No  FALLS:  Has patient fallen in last 6 months? Yes, 1 falls onto bed  LIVING ENVIRONMENT: Lives with: lives with their family and lives with their spouse Lives in: House/apartment Stairs: Yes: Internal: 12 steps; can reach both Has following equipment at home: Walker - 2 wheeled, Shower bench, and bed side commode  OCCUPATION: retired  PLOF: Independent and was using a SPC, she was doing some cooking and cleaning, has stairs that she goes up and down to do laundry  PATIENT GOALS: have good ROM, less pain, walk better, go up and down stairs, feel sturdier  NEXT MD VISIT: none  OBJECTIVE:   DIAGNOSTIC FINDINGS: MD feels like the back is normal after surgery  COGNITION: Overall cognitive  status: Within functional limits for tasks assessed     SENSATION: WFL  PALPATION: Very tender in the right hip and lateral ITB, some tightness and tenderness in the low back  LOWER EXTREMITY ROM:  Active ROM Right AROM eval Left  AROM eval  Hip flexion 70 70  Hip extension 5 5  Hip abduction 5 5  Hip adduction    Hip internal rotation    Hip external rotation    Knee flexion    Knee extension    Ankle dorsiflexion 3   Ankle plantarflexion 40   Ankle inversion 10   Ankle eversion 4    (Blank rows = not tested)  LOWER EXTREMITY MMT:  MMT Right eval Left eval  Hip flexion 3+ 4-  Hip extension    Hip abduction 4- 4-  Hip adduction 4- 4-  Hip internal rotation    Hip external rotation    Knee flexion 4 4  Knee extension 4 4  Ankle dorsiflexion 3+in ROM 4-  Ankle plantarflexion 4in ROM 4  Ankle inversion 3+in ROM 4-  Ankle eversion 3+in ROM 4-   (Blank rows = not  tested) FUNCTIONAL TESTS:  5 times sit to stand: 36 seconds really struggled needed to start with hands but then was able to do with the backs of the legs on the chair Timed up and go (TUG): 21 seconds with FWW 3 minute walk test: with FWW 280 feet fatigue  GAIT: Distance walked: 280 feet Assistive device utilized: Walker - 2 wheeled Level of assistance: SBA Comments: slow and guarded, small steps   TODAY'S TREATMENT:                                                                                                                              DATE:   05/17/23 Bike L 3 x 2 min NuStep L 5 x4 min LE only Hip abd standing RW 3lb 2x10 Seated March 3lb 2x10 S2S min assist at times 2x10 HS curls 5lb 2x19 Leg Ext 10lb 2x10 LE on pball bridges, oblq, K2C  R hip PROM with end range holds for HS, piriformis, Single K2C, ITB  05/10/23 NuStep L 4 x 6 min LE only  Hip add ball squeeze 2x10 Hip abd green Tband 2x10 Heel raises in RW 2x10 Resisted ankle PF/DF  red 2x10 each S2S 2x10 Bridges x10   R hip PROM with end range holds for HS, piriformis, Single K2C, ITB Standing hip Abd ans Ext w/ RW 2x10  PATIENT EDUCATION:  Education details: HEP/POC Person educated: Patient Education method: Programmer, multimedia, Facilities manager, Verbal cues, and Handouts Education comprehension: verbalized understanding  HOME EXERCISE PROGRAM: Access Code: [email or web address] URL: [email or web address] Date: 05/03/2023 Prepared by: Stacie Glaze  Exercises - Seated Calf Towel Stretch  - 2 x daily - 7 x weekly - 1 sets - 5 reps - 30 hold - Seated March  - 2 x daily - 7 x weekly - 1 sets -  10 reps - 3 hold - Seated Long Arc Quad  - 2 x daily - 7 x weekly - 1 sets - 10 reps - 3 hold - Seated Heel Raise  - 2 x daily - 7 x weekly - 1 sets - 10 reps - 3 hold - Seated Toe Raise  - 2 x daily - 7 x weekly - 1 sets - 10 reps - 3 hold  ASSESSMENT:  CLINICAL IMPRESSION: Patient is a 89 y.o. female who was seen  today for physical therapy d treatment for LBP, right LE radiculopathy, weakness in the legs, difficulty walking and unsteady gait.  She enters reporting that she felt really good after last session.  All interventions completed well. Cues for full ROM and eccentric control needed with leg curls and extensions. She continues to demonstrate some functional weakness with sit to stands, cues needed for glute activations. Pt had a difficult time relaxing with stretching. She is very thight in the R piriformis muscle.  OBJECTIVE IMPAIRMENTS: Abnormal gait, cardiopulmonary status limiting activity, decreased activity tolerance, decreased balance, decreased endurance, decreased mobility, difficulty walking, decreased ROM, decreased strength, increased edema, impaired flexibility, improper body mechanics, and pain.   REHAB POTENTIAL: Good  CLINICAL DECISION MAKING: Stable/uncomplicated  EVALUATION COMPLEXITY: Low   GOALS: Goals reviewed with patient? Yes  SHORT TERM GOALS: Target date: 05/31/23 Independent with initial HEP Goal status: Met 05/17/23  LONG TERM GOALS: Target date: 08/01/23  Independent with advanced HEP Goal status: INITIAL  2.  Decrease pain 50% Goal status: INITIAL  3.  Increase AROM of the left hip to 90 degrees in standing Goal status: INITIAL  4.  Go up and down stairs safely step over step with hand rails Goal status: INITIAL at evaluation reported that she has really struggled and does one at a time and has had to get some help due to laundry in basement  5.  Decrease TUG time to 15 seconds Goal status: INITIAL at evaluation 21 seconds  6.  Decrease 5XSTS to 20 seconds Goal status: INITIAL at evaluation 36 seconds   PLAN:  PT FREQUENCY: 1-2x/week  PT DURATION: 12 weeks  PLANNED INTERVENTIONS: Therapeutic exercises, Therapeutic activity, Neuromuscular re-education, Balance training, Gait training, Patient/Family education, Self Care, Joint mobilization, Joint  manipulation, Stair training, Electrical stimulation, Vasopneumatic device, and Manual therapy  PLAN FOR NEXT SESSION: slowly start activities for strength, ROM, function and gait, balance   Grayce Sessions, PTA 05/17/2023, 3:19 PM

## 2023-05-24 ENCOUNTER — Ambulatory Visit: Payer: Medicare Other | Admitting: Physical Therapy

## 2023-05-31 ENCOUNTER — Ambulatory Visit: Payer: Medicare Other | Admitting: Physical Therapy

## 2023-05-31 ENCOUNTER — Encounter: Payer: Self-pay | Admitting: Physical Therapy

## 2023-05-31 DIAGNOSIS — M5459 Other low back pain: Secondary | ICD-10-CM

## 2023-05-31 DIAGNOSIS — R2681 Unsteadiness on feet: Secondary | ICD-10-CM

## 2023-05-31 DIAGNOSIS — M62830 Muscle spasm of back: Secondary | ICD-10-CM

## 2023-05-31 DIAGNOSIS — M6281 Muscle weakness (generalized): Secondary | ICD-10-CM

## 2023-05-31 DIAGNOSIS — R262 Difficulty in walking, not elsewhere classified: Secondary | ICD-10-CM

## 2023-05-31 NOTE — Therapy (Signed)
 OUTPATIENT PHYSICAL THERAPY LOWER EXTREMITY TREATMENT   Patient Name: [name] MRN: [id number] DOB:[birth year], 89 y.o., female Today's Date: 05/31/2023  END OF SESSION:  PT End of Session - 05/31/23 1608     Visit Number 4    Date for PT Re-Evaluation 08/01/23    Authorization Type [employer] Medicare    PT Start Time 1608    PT Stop Time 1655    PT Time Calculation (min) 47 min    Activity Tolerance Patient tolerated treatment well    Behavior During Therapy WFL for tasks assessed/performed              Past Medical History:  Diagnosis Date   Acalculous cholecystitis 08/24/2018   Arthritis    DVT of lower extremity (deep venous thrombosis) (HCC)    right leg - after back surgery in 2019   GERD (gastroesophageal reflux disease)    History of blood transfusion    Hypothyroidism    PONV (postoperative nausea and vomiting)    Ptosis of both eyelids    Vertigo    Wears glasses    reading   Past Surgical History:  Procedure Laterality Date   ABDOMINAL HYSTERECTOMY  1986   APPLICATION OF ROBOTIC ASSISTANCE FOR SPINAL PROCEDURE N/A 07/17/2017   Procedure: APPLICATION OF ROBOTIC ASSISTANCE FOR SPINAL PROCEDURE;  Surgeon: Barnett Abu, MD;  Location: [facility];  Service: Neurosurgery;  Laterality: N/A;   APPLICATION OF ROBOTIC ASSISTANCE FOR SPINAL PROCEDURE N/A 09/01/2017   Procedure: APPLICATION OF ROBOTIC ASSISTANCE FOR SPINAL PROCEDURE;  Surgeon: Barnett Abu, MD;  [facility]: [facility];  Service: Neurosurgery;  Laterality: N/A;   APPLICATION OF ROBOTIC ASSISTANCE FOR SPINAL PROCEDURE N/A 10/27/2020   Procedure: APPLICATION OF ROBOTIC ASSISTANCE FOR SPINAL PROCEDURE;  Surgeon: Barnett Abu, MD;  [facility];  Service: Neurosurgery;  Laterality: N/A;   BACK SURGERY  1986   lumb lam   BREAST BIOPSY Right 2011   CARPAL TUNNEL RELEASE  2000   right   CARPAL TUNNEL RELEASE  2012   left   CHOLECYSTECTOMY N/A 08/24/2018   Procedure: LAPAROSCOPIC CHOLECYSTECTOMY WITH  INTRAOPERATIVE CHOLANGIOGRAM;  Surgeon: Claud Kelp, MD;  Location: [facility] OR;  Service: General;  Laterality: N/A;   COLONOSCOPY     CYSTOCELE REPAIR  2007   sling   DISTAL INTERPHALANGEAL JOINT FUSION Right 02/04/2014   Procedure: FUSION DISTAL INTERPHALANGEAL JOINT RIGHT INDEX FINGER ;  Surgeon: Cindee Salt, MD;  Location: [facility];  Service: Orthopedics;  Laterality: Right;   EYE SURGERY Bilateral    Cataract surgery with lens implant   KNEE SURGERY  2009,2011   partial knee   LAPAROSCOPY N/A 08/24/2018   Procedure: LAPAROSCOPY DIAGNOSTIC;  Surgeon: Claud Kelp, MD;  Location: [facility] OR;  Service: General;  Laterality: N/A;   LUMBAR PERCUTANEOUS PEDICLE SCREW 4 LEVEL N/A 10/27/2020   Procedure: Thoracic nine-ten Laminectomy with extension of fusion from Thoracic ten to Thoracic six with segmental fixation (cemented) with pedicle screw and mazor;  Surgeon: Barnett Abu, MD;  Location: [facility] OR;  Service: Neurosurgery;  Laterality: N/A;   OPEN REDUCTION INTERNAL FIXATION (ORIF) PROXIMAL PHALANX Left 08/30/2013   Procedure: OPEN REDUCTION INTERNAL FIXATION (ORIF) PROXIMAL PHALANX FRACTURE LEFT SMALL FINGER; SPLINT RING FINGER;  Surgeon: Nicki Reaper, MD;  Location: [facility];  Service: Orthopedics;  Laterality: Left;   POSTERIOR LUMBAR FUSION 4 LEVEL N/A 07/17/2017   Procedure: Thoracic Ten - Lumbar Five revison of hardware with Mazor;  Surgeon: Barnett Abu,  MD;  [facility]: [facility];  Service: Neurosurgery;  Laterality: N/A;  Thoracic/Lumbar   PTOSIS REPAIR Bilateral 07/27/2015   Procedure: PTOSIS REPAIR;  Surgeon: Louisa Second, MD;  Location: [facility];  Service: Plastics;  Laterality: Bilateral;   SHOULDER SURGERY     rt rcr,and lt   TENOLYSIS Left 05/21/2021   Procedure: RELEASE EXTENSOR POLLICIS LONGUS LEFT;  Surgeon: Cindee Salt, MD;  Location: [facility];  Service: Orthopedics;  Laterality: Left;   TENOLYSIS Left 06/08/2021    Procedure: RELEASE EXTENSOR POLLICIS LONGUS LEFT;  Surgeon: Cindee Salt, MD;  Location: [facility];  Service: Orthopedics;  Laterality: Left;   TONSILLECTOMY     TOTAL KNEE ARTHROPLASTY Right 12/23/2019   Procedure: TOTAL KNEE ARTHROPLASTY;  Surgeon: Dannielle Huh, MD;  Location: [facility];  Service: Orthopedics;  Laterality: Right;   TRIGGER FINGER RELEASE Left 05/21/2021   Procedure: RELEASE TRIGGER FINGER/A-1 PULLEY LEFT MIDDLE FINGER;  Surgeon: Cindee Salt, MD;  Location: [facility];  Service: Orthopedics;  Laterality: Left;   TRIGGER FINGER RELEASE Left 06/08/2021   Procedure: RELEASE A-1 PULLEY LEFT MIDDLE FINGER;  Surgeon: Cindee Salt, MD;  Location: [facility];  Service: Orthopedics;  Laterality: Left;   Patient Active Problem List   Diagnosis Date Noted   Myelopathy concurrent with and due to spinal stenosis of thoracic region (HCC) 10/27/2020   S/P total knee arthroplasty, right 12/23/2019   Acalculous cholecystitis 08/24/2018   Osteoarthritis of right knee    Vertigo    DDD (degenerative disc disease), cervical    Benign essential HTN    History of DVT (deep vein thrombosis)    Tachycardia    Steroid-induced hyperglycemia    Hypothyroidism    Neuropathic pain    Acute blood loss anemia    S/P lumbar fusion    Closed L5 vertebral fracture (HCC) 08/31/2017   Closed fracture of fifth lumbar vertebra (HCC) 08/30/2017   Lumbar vertebral fracture (HCC) 07/17/2017   Scoliosis 06/13/2017    PCP: Burton Apley, MD  REFERRING PROVIDER: Sherlean Foot, MD  REFERRING DIAG: S/P left TKA  THERAPY DIAG:  Other low back pain  Muscle weakness (generalized)  Difficulty in walking, not elsewhere classified  Unsteady gait  Muscle spasm of back  Rationale for Evaluation and Treatment: Rehabilitation  ONSET DATE: 10/17/22  SUBJECTIVE:   SUBJECTIVE STATEMENT: I am doing okay, I am sore in the left knee.  I have still been doing a  lot  PERTINENT HISTORY: See above PAIN:  Are you having pain? Yes: NPRS scale: 0/10 Pain location: back and right leg pain Pain description: ache, deep, feels like a nerve Aggravating factors: night, bending, being up on the leg pain a 10/10 Relieving factors: sit down, elevate, pain medication, ice pain can be 0/10  PRECAUTIONS: None  RED FLAGS: None   WEIGHT BEARING RESTRICTIONS: No  FALLS:  Has patient fallen in last 6 months? Yes, 1 falls onto bed  LIVING ENVIRONMENT: Lives with: lives with their family and lives with their spouse Lives in: House/apartment Stairs: Yes: Internal: 12 steps; can reach both Has following equipment at home: Walker - 2 wheeled, Shower bench, and bed side commode  OCCUPATION: retired  PLOF: Independent and was using a SPC, she was doing some cooking and cleaning, has stairs that she goes up and down to do laundry  PATIENT GOALS: have good ROM, less pain, walk better, go up and down stairs, feel sturdier  NEXT MD VISIT:  none  OBJECTIVE:   DIAGNOSTIC FINDINGS: MD feels like the back is normal after surgery  COGNITION: Overall cognitive status: Within functional limits for tasks assessed     SENSATION: WFL  PALPATION: Very tender in the right hip and lateral ITB, some tightness and tenderness in the low back  LOWER EXTREMITY ROM:  Active ROM Right AROM eval Left  AROM eval  Hip flexion 70 70  Hip extension 5 5  Hip abduction 5 5  Hip adduction    Hip internal rotation    Hip external rotation    Knee flexion    Knee extension    Ankle dorsiflexion 3   Ankle plantarflexion 40   Ankle inversion 10   Ankle eversion 4    (Blank rows = not tested)  LOWER EXTREMITY MMT:  MMT Right eval Left eval  Hip flexion 3+ 4-  Hip extension    Hip abduction 4- 4-  Hip adduction 4- 4-  Hip internal rotation    Hip external rotation    Knee flexion 4 4  Knee extension 4 4  Ankle dorsiflexion 3+in ROM 4-  Ankle plantarflexion  4in ROM 4  Ankle inversion 3+in ROM 4-  Ankle eversion 3+in ROM 4-   (Blank rows = not tested) FUNCTIONAL TESTS:  5 times sit to stand: 36 seconds really struggled needed to start with hands but then was able to do with the backs of the legs on the chair Timed up and go (TUG): 21 seconds with FWW 3 minute walk test: with FWW 280 feet fatigue  GAIT: Distance walked: 280 feet Assistive device utilized: Walker - 2 wheeled Level of assistance: SBA Comments: slow and guarded, small steps   TODAY'S TREATMENT:                                                                                                                              DATE:   05/31/23 Nustep level 5 x 5 minutes Bike level 4 x 5 minutes Gait with 2 SPC's outside around 1/2 the parking [place], rest and then some walk in the grass Red tband left ankle PF/DF,  25# HS curls 2.5# LAQ 2.5# marches 2 SPC's cone toe touches   05/17/23 Bike L 3 x 2 min NuStep L 5 x4 min LE only Hip abd standing RW 3lb 2x10 Seated March 3lb 2x10 S2S min assist at times 2x10 HS curls 5lb 2x19 Leg Ext 10lb 2x10 LE on pball bridges, oblq, K2C  R hip PROM with end range holds for HS, piriformis, Single K2C, ITB  05/10/23 NuStep L 4 x 6 min LE only  Hip add ball squeeze 2x10 Hip abd green Tband 2x10 Heel raises in RW 2x10 Resisted ankle PF/DF  red 2x10 each S2S 2x10 Bridges x10   R hip PROM with end range holds for HS, piriformis, Single K2C, ITB Standing hip Abd ans Ext w/ RW 2x10  PATIENT EDUCATION:  Education details: HEP/POC  Person educated: Patient Education method: Explanation, Demonstration, Verbal cues, and Handouts Education comprehension: verbalized understanding  HOME EXERCISE PROGRAM: Access Code: [email or web address] URL: [email or web address] Date: 05/03/2023 Prepared by: Stacie Glaze  Exercises - Seated Calf Towel Stretch  - 2 x daily - 7 x weekly - 1 sets - 5 reps - 30 hold - Seated March  - 2 x daily - 7 x  weekly - 1 sets - 10 reps - 3 hold - Seated Long Arc Quad  - 2 x daily - 7 x weekly - 1 sets - 10 reps - 3 hold - Seated Heel Raise  - 2 x daily - 7 x weekly - 1 sets - 10 reps - 3 hold - Seated Toe Raise  - 2 x daily - 7 x weekly - 1 sets - 10 reps - 3 hold  ASSESSMENT:  CLINICAL IMPRESSION: Patient is a 89 y.o. female who was seen today for physical therapy d treatment for LBP, right LE radiculopathy, weakness in the legs, difficulty walking and unsteady gait.  Patient reports that she is trying to stay active, reports a little mild pain in the left knee.  She reports a little tender.  She did well with the 2 SPC's with CGA outside, no real loss of balance, just needed CGA with curbs  OBJECTIVE IMPAIRMENTS: Abnormal gait, cardiopulmonary status limiting activity, decreased activity tolerance, decreased balance, decreased endurance, decreased mobility, difficulty walking, decreased ROM, decreased strength, increased edema, impaired flexibility, improper body mechanics, and pain.   REHAB POTENTIAL: Good  CLINICAL DECISION MAKING: Stable/uncomplicated  EVALUATION COMPLEXITY: Low   GOALS: Goals reviewed with patient? Yes  SHORT TERM GOALS: Target date: 05/31/23 Independent with initial HEP Goal status: Met 05/17/23  LONG TERM GOALS: Target date: 08/01/23  Independent with advanced HEP Goal status: INITIAL  2.  Decrease pain 50% Goal status: progressing 05/31/23  3.  Increase AROM of the left hip to 90 degrees in standing Goal status: progressing 05/31/23  4.  Go up and down stairs safely step over step with hand rails Goal status: INITIAL at evaluation reported that she has really struggled and does one at a time and has had to get some help due to laundry in basement  5.  Decrease TUG time to 15 seconds Goal status: INITIAL at evaluation 21 seconds  6.  Decrease 5XSTS to 20 seconds Goal status: INITIAL at evaluation 36 seconds   PLAN:  PT FREQUENCY: 1-2x/week  PT  DURATION: 12 weeks  PLANNED INTERVENTIONS: Therapeutic exercises, Therapeutic activity, Neuromuscular re-education, Balance training, Gait training, Patient/Family education, Self Care, Joint mobilization, Joint manipulation, Stair training, Electrical stimulation, Vasopneumatic device, and Manual therapy  PLAN FOR NEXT SESSION: strength, ROM, function and gait, balance   Imo Cumbie W, PT 05/31/2023, 4:09 PM

## 2023-06-06 ENCOUNTER — Other Ambulatory Visit: Payer: Self-pay | Admitting: Internal Medicine

## 2023-06-06 DIAGNOSIS — Z0000 Encounter for general adult medical examination without abnormal findings: Secondary | ICD-10-CM

## 2023-06-07 ENCOUNTER — Ambulatory Visit: Payer: Medicare Other | Admitting: Physical Therapy

## 2023-06-14 ENCOUNTER — Encounter: Payer: Self-pay | Admitting: Physical Therapy

## 2023-06-14 ENCOUNTER — Ambulatory Visit: Payer: Medicare Other | Attending: Orthopedic Surgery | Admitting: Physical Therapy

## 2023-06-14 DIAGNOSIS — M5459 Other low back pain: Secondary | ICD-10-CM | POA: Diagnosis present

## 2023-06-14 DIAGNOSIS — R262 Difficulty in walking, not elsewhere classified: Secondary | ICD-10-CM | POA: Insufficient documentation

## 2023-06-14 DIAGNOSIS — M6281 Muscle weakness (generalized): Secondary | ICD-10-CM | POA: Insufficient documentation

## 2023-06-14 DIAGNOSIS — R2681 Unsteadiness on feet: Secondary | ICD-10-CM | POA: Insufficient documentation

## 2023-06-14 NOTE — Therapy (Signed)
 OUTPATIENT PHYSICAL THERAPY LOWER EXTREMITY TREATMENT   Patient Name: [name] MRN: [id number] DOB:1933-02-26, 89 y.o., female Today's Date: 06/14/2023  END OF SESSION:  PT End of Session - 06/14/23 1612     Visit Number 5    Date for PT Re-Evaluation 08/01/23    Authorization Type [employer] Medicare 5/12    PT Start Time 1606    PT Stop Time 1650    PT Time Calculation (min) 44 min    Activity Tolerance Patient tolerated treatment well    Behavior During Therapy [facility] for tasks assessed/performed              Past Medical History:  Diagnosis Date   Acalculous cholecystitis 08/24/2018   Arthritis    DVT of lower extremity (deep venous thrombosis) (HCC)    right leg - after back surgery in 2019   GERD (gastroesophageal reflux disease)    History of blood transfusion    Hypothyroidism    PONV (postoperative nausea and vomiting)    Ptosis of both eyelids    Vertigo    Wears glasses    reading   Past Surgical History:  Procedure Laterality Date   ABDOMINAL HYSTERECTOMY  1986   APPLICATION OF ROBOTIC ASSISTANCE FOR SPINAL PROCEDURE N/A 07/17/2017   Procedure: APPLICATION OF ROBOTIC ASSISTANCE FOR SPINAL PROCEDURE;  Surgeon: Barnett Abu, MD;  Location: [facility] OR;  Service: Neurosurgery;  Laterality: N/A;   APPLICATION OF ROBOTIC ASSISTANCE FOR SPINAL PROCEDURE N/A 09/01/2017   Procedure: APPLICATION OF ROBOTIC ASSISTANCE FOR SPINAL PROCEDURE;  Surgeon: Barnett Abu, MD;  Location: [facility] OR;  Service: Neurosurgery;  Laterality: N/A;   APPLICATION OF ROBOTIC ASSISTANCE FOR SPINAL PROCEDURE N/A 10/27/2020   Procedure: APPLICATION OF ROBOTIC ASSISTANCE FOR SPINAL PROCEDURE;  Surgeon: Barnett Abu, MD;  Location: [facility] OR;  Service: Neurosurgery;  Laterality: N/A;   BACK SURGERY  1986   lumb lam   BREAST BIOPSY Right 2011   CARPAL TUNNEL RELEASE  2000   right   CARPAL TUNNEL RELEASE  2012   left   CHOLECYSTECTOMY N/A 08/24/2018   Procedure: LAPAROSCOPIC CHOLECYSTECTOMY WITH  INTRAOPERATIVE CHOLANGIOGRAM;  Surgeon: Claud Kelp, MD;  Location[facility] OR;  Service: General;  Laterality: N/A;   COLONOSCOPY     CYSTOCELE REPAIR  2007   sling   DISTAL INTERPHALANGEAL JOINT FUSION Right 02/04/2014   Procedure: FUSION DISTAL INTERPHALANGEAL JOINT RIGHT INDEX FINGER ;  Surgeon: Cindee Salt, MD;  Location: [facility];  Service: Orthopedics;  Laterality: Right;   EYE SURGERY Bilateral    Cataract surgery with lens implant   KNEE SURGERY  2009,2011   partial knee   LAPAROSCOPY N/A 08/24/2018   Procedure: LAPAROSCOPY DIAGNOSTIC;  Surgeon: Claud Kelp, MD;  Location[facility];  Service: General;  Laterality: N/A;   LUMBAR PERCUTANEOUS PEDICLE SCREW 4 LEVEL N/A 10/27/2020   Procedure: Thoracic nine-ten Laminectomy with extension of fusion from Thoracic ten to Thoracic six with segmental fixation (cemented) with pedicle screw and mazor;  Surgeon: Barnett Abu, MD;  Location: [facility] OR;  Service: Neurosurgery;  Laterality: N/A;   OPEN REDUCTION INTERNAL FIXATION (ORIF) PROXIMAL PHALANX Left 08/30/2013   Procedure: OPEN REDUCTION INTERNAL FIXATION (ORIF) PROXIMAL PHALANX FRACTURE LEFT SMALL FINGER; SPLINT RING FINGER;  Surgeon: Nicki Reaper, MD;  Location: [facility];  Service: Orthopedics;  Laterality: Left;   POSTERIOR LUMBAR FUSION 4 LEVEL N/A 07/17/2017   Procedure: Thoracic Ten - Lumbar Five revison of hardware with Mazor;  Surgeon: Danielle Dess,  Sherilyn Cooter, MD;  Location: [facility] OR;  Service: Neurosurgery;  Laterality: N/A;  Thoracic/Lumbar   PTOSIS REPAIR Bilateral 07/27/2015   Procedure: PTOSIS REPAIR;  Surgeon: Louisa Second, MD;  Location: [facility];  Service: Plastics;  Laterality: Bilateral;   SHOULDER SURGERY     rt rcr,and lt   TENOLYSIS Left 05/21/2021   Procedure: RELEASE EXTENSOR POLLICIS LONGUS LEFT;  Surgeon: Cindee Salt, MD;  Location: [facility];  Service: Orthopedics;  Laterality: Left;   TENOLYSIS Left 06/08/2021    Procedure: RELEASE EXTENSOR POLLICIS LONGUS LEFT;  Surgeon: Cindee Salt, MD;  Location: [facility];  Service: Orthopedics;  Laterality: Left;   TONSILLECTOMY     TOTAL KNEE ARTHROPLASTY Right 12/23/2019   Procedure: TOTAL KNEE ARTHROPLASTY;  Surgeon: Dannielle Huh, MD;  Location: [facility];  Service: Orthopedics;  Laterality: Right;   TRIGGER FINGER RELEASE Left 05/21/2021   Procedure: RELEASE TRIGGER FINGER/A-1 PULLEY LEFT MIDDLE FINGER;  Surgeon: Cindee Salt, MD;  Location: [facility];  Service: Orthopedics;  Laterality: Left;   TRIGGER FINGER RELEASE Left 06/08/2021   Procedure: RELEASE A-1 PULLEY LEFT MIDDLE FINGER;  Surgeon: Cindee Salt, MD;  Location: [facility];  Service: Orthopedics;  Laterality: Left;   Patient Active Problem List   Diagnosis Date Noted   Myelopathy concurrent with and due to spinal stenosis of thoracic region (HCC) 10/27/2020   S/P total knee arthroplasty, right 12/23/2019   Acalculous cholecystitis 08/24/2018   Osteoarthritis of right knee    Vertigo    DDD (degenerative disc disease), cervical    Benign essential HTN    History of DVT (deep vein thrombosis)    Tachycardia    Steroid-induced hyperglycemia    Hypothyroidism    Neuropathic pain    Acute blood loss anemia    S/P lumbar fusion    Closed L5 vertebral fracture (HCC) 08/31/2017   Closed fracture of fifth lumbar vertebra (HCC) 08/30/2017   Lumbar vertebral fracture (HCC) 07/17/2017   Scoliosis 06/13/2017    PCP: Burton Apley, MD  REFERRING PROVIDER: Sherlean Foot, MD  REFERRING DIAG: S/P left TKA  THERAPY DIAG:  Other low back pain  Muscle weakness (generalized)  Difficulty in walking, not elsewhere classified  Unsteady gait  Rationale for Evaluation and Treatment: Rehabilitation  ONSET DATE: 10/17/22  SUBJECTIVE:   SUBJECTIVE STATEMENT: Reports that she went to [employer] on Sunday and that when leaving she reports feeling some numbness in the  right leg, felt like she had some weakness, and numbness in the heel and some tingling in the foot.  She has called Dr. Danielle Dess and has an appointment April 23 with him  PERTINENT HISTORY: See above PAIN:  Are you having pain? Yes: NPRS scale: 0/10 Pain location: back and right leg pain Pain description: ache, deep, feels like a nerve Aggravating factors: night, bending, being up on the leg pain a 10/10 Relieving factors: sit down, elevate, pain medication, ice pain can be 0/10  PRECAUTIONS: None  RED FLAGS: None   WEIGHT BEARING RESTRICTIONS: No  FALLS:  Has patient fallen in last 6 months? Yes, 1 falls onto bed  LIVING ENVIRONMENT: Lives with: lives with their family and lives with their spouse Lives in: House/apartment Stairs: Yes: Internal: 12 steps; can reach both Has following equipment at home: Walker - 2 wheeled, Shower bench, and bed side commode  OCCUPATION: retired  PLOF: Independent and was using a SPC, she was doing some cooking and cleaning, has stairs that  she goes up and down to do laundry  PATIENT GOALS: have good ROM, less pain, walk better, go up and down stairs, feel sturdier  NEXT MD VISIT: none  OBJECTIVE:   DIAGNOSTIC FINDINGS: MD feels like the back is normal after surgery  COGNITION: Overall cognitive status: Within functional limits for tasks assessed     SENSATION: WFL  PALPATION: Very tender in the right hip and lateral ITB, some tightness and tenderness in the low back  LOWER EXTREMITY ROM:  Active ROM Right AROM eval Left  AROM eval  Hip flexion 70 70  Hip extension 5 5  Hip abduction 5 5  Hip adduction    Hip internal rotation    Hip external rotation    Knee flexion    Knee extension    Ankle dorsiflexion 3   Ankle plantarflexion 40   Ankle inversion 10   Ankle eversion 4    (Blank rows = not tested)  LOWER EXTREMITY MMT:  MMT Right eval Left eval  Hip flexion 3+ 4-  Hip extension    Hip abduction 4- 4-  Hip  adduction 4- 4-  Hip internal rotation    Hip external rotation    Knee flexion 4 4  Knee extension 4 4  Ankle dorsiflexion 3+in ROM 4-  Ankle plantarflexion 4in ROM 4  Ankle inversion 3+in ROM 4-  Ankle eversion 3+in ROM 4-   (Blank rows = not tested) FUNCTIONAL TESTS:  5 times sit to stand: 36 seconds really struggled needed to start with hands but then was able to do with the backs of the legs on the chair Timed up and go (TUG): 21 seconds with FWW 3 minute walk test: with FWW 280 feet fatigue  GAIT: Distance walked: 280 feet Assistive device utilized: Walker - 2 wheeled Level of assistance: SBA Comments: slow and guarded, small steps   TODAY'S TREATMENT:                                                                                                                              DATE:   06/14/23 Nustep level 5 x 6 minutes Feet on ball K2C, rotation, small bridge, iso abs Clamshells green tband Ball b/n knees squeeze Passive stretch LE's some manual hip distraction Discussed HEP  05/31/23 Nustep level 5 x 5 minutes Bike level 4 x 5 minutes Gait with 2 SPC's outside around 1/2 the parking [place], rest and then some walk in the grass Red tband left ankle PF/DF,  25# HS curls 2.5# LAQ 2.5# marches 2 SPC's cone toe touches   05/17/23 Bike L 3 x 2 min NuStep L 5 x4 min LE only Hip abd standing RW 3lb 2x10 Seated March 3lb 2x10 S2S min assist at times 2x10 HS curls 5lb 2x19 Leg Ext 10lb 2x10 LE on pball bridges, oblq, K2C  R hip PROM with end range holds for HS, piriformis, Single K2C, ITB  05/10/23 NuStep L 4  x 6 min LE only  Hip add ball squeeze 2x10 Hip abd green Tband 2x10 Heel raises in RW 2x10 Resisted ankle PF/DF  red 2x10 each S2S 2x10 Bridges x10   R hip PROM with end range holds for HS, piriformis, Single K2C, ITB Standing hip Abd ans Ext w/ RW 2x10  PATIENT EDUCATION:  Education details: HEP/POC Person educated: Patient Education method:  Programmer, multimedia, Facilities manager, Verbal cues, and Handouts Education comprehension: verbalized understanding  HOME EXERCISE PROGRAM: Access Code: [email or web address] URL: [email or web address] Date: 05/03/2023 Prepared by: Stacie Glaze  Exercises - Seated Calf Towel Stretch  - 2 x daily - 7 x weekly - 1 sets - 5 reps - 30 hold - Seated March  - 2 x daily - 7 x weekly - 1 sets - 10 reps - 3 hold - Seated Long Arc Quad  - 2 x daily - 7 x weekly - 1 sets - 10 reps - 3 hold - Seated Heel Raise  - 2 x daily - 7 x weekly - 1 sets - 10 reps - 3 hold - Seated Toe Raise  - 2 x daily - 7 x weekly - 1 sets - 10 reps - 3 hold  ASSESSMENT:  CLINICAL IMPRESSION: Patient is a 89 y.o. female who was seen today for physical therapy d treatment for LBP, right LE radiculopathy, weakness in the legs, difficulty walking and unsteady gait. Patient has not been in to PT in two weeks, she reports that the past week she had an episode of the right LE hurting, numbness and tingling in the foot and the heel on the right.  She reports that she has an appointment to see Dr. Danielle Dess in April, she feels it is a pinched nerve, she also reports a possible appointment with an ankle specialist in the future.  I was gentle today due to the new pain and N/T  OBJECTIVE IMPAIRMENTS: Abnormal gait, cardiopulmonary status limiting activity, decreased activity tolerance, decreased balance, decreased endurance, decreased mobility, difficulty walking, decreased ROM, decreased strength, increased edema, impaired flexibility, improper body mechanics, and pain.   REHAB POTENTIAL: Good  CLINICAL DECISION MAKING: Stable/uncomplicated  EVALUATION COMPLEXITY: Low   GOALS: Goals reviewed with patient? Yes  SHORT TERM GOALS: Target date: 05/31/23 Independent with initial HEP Goal status: Met 05/17/23  LONG TERM GOALS: Target date: 08/01/23  Independent with advanced HEP Goal status: INITIAL  2.  Decrease pain 50% Goal status:  progressing 05/31/23  3.  Increase AROM of the left hip to 90 degrees in standing Goal status: progressing 05/31/23  4.  Go up and down stairs safely step over step with hand rails Goal status: INITIAL at evaluation reported that she has really struggled and does one at a time and has had to get some help due to laundry in basement  5.  Decrease TUG time to 15 seconds Goal status: INITIAL at evaluation 21 seconds  6.  Decrease 5XSTS to 20 seconds Goal status: INITIAL at evaluation 36 seconds   PLAN:  PT FREQUENCY: 1-2x/week  PT DURATION: 12 weeks  PLANNED INTERVENTIONS: Therapeutic exercises, Therapeutic activity, Neuromuscular re-education, Balance training, Gait training, Patient/Family education, Self Care, Joint mobilization, Joint manipulation, Stair training, Electrical stimulation, Vasopneumatic device, and Manual therapy  PLAN FOR NEXT SESSION: may hold until she sees the MD   Jearld Lesch, PT 06/14/2023, 4:13 PM

## 2023-06-21 ENCOUNTER — Ambulatory Visit: Payer: Medicare Other | Admitting: Physical Therapy

## 2023-06-28 ENCOUNTER — Ambulatory Visit: Payer: Medicare Other | Admitting: Physical Therapy

## 2023-07-04 ENCOUNTER — Ambulatory Visit
Admission: RE | Admit: 2023-07-04 | Discharge: 2023-07-04 | Disposition: A | Discharge status: Home / Self Care | Source: Ambulatory Visit | Attending: Internal Medicine | Admitting: Internal Medicine

## 2023-07-04 DIAGNOSIS — Z0000 Encounter for general adult medical examination without abnormal findings: Secondary | ICD-10-CM

## 2023-07-10 ENCOUNTER — Other Ambulatory Visit (HOSPITAL_COMMUNITY): Payer: Self-pay | Admitting: Physician Assistant

## 2023-07-10 DIAGNOSIS — Z96659 Presence of unspecified artificial knee joint: Secondary | ICD-10-CM

## 2023-07-13 ENCOUNTER — Ambulatory Visit (HOSPITAL_COMMUNITY)

## 2023-07-18 ENCOUNTER — Other Ambulatory Visit (HOSPITAL_COMMUNITY): Payer: Self-pay | Admitting: Physician Assistant

## 2023-07-18 DIAGNOSIS — M76899 Other specified enthesopathies of unspecified lower limb, excluding foot: Secondary | ICD-10-CM

## 2023-07-18 DIAGNOSIS — Z96659 Presence of unspecified artificial knee joint: Secondary | ICD-10-CM

## 2023-07-19 ENCOUNTER — Ambulatory Visit (HOSPITAL_COMMUNITY): Admission: RE | Admit: 2023-07-19 | Source: Ambulatory Visit

## 2023-07-19 ENCOUNTER — Encounter (HOSPITAL_COMMUNITY): Payer: Self-pay

## 2023-07-21 ENCOUNTER — Ambulatory Visit
Admission: RE | Admit: 2023-07-21 | Discharge: 2023-07-21 | Disposition: A | Discharge status: Home / Self Care | Source: Ambulatory Visit | Attending: Physician Assistant | Admitting: Physician Assistant

## 2023-07-21 DIAGNOSIS — T84038A Mechanical loosening of other internal prosthetic joint, initial encounter: Secondary | ICD-10-CM | POA: Insufficient documentation

## 2023-07-21 DIAGNOSIS — M76899 Other specified enthesopathies of unspecified lower limb, excluding foot: Secondary | ICD-10-CM | POA: Diagnosis present

## 2023-07-21 DIAGNOSIS — Z96659 Presence of unspecified artificial knee joint: Secondary | ICD-10-CM | POA: Insufficient documentation

## 2023-08-16 ENCOUNTER — Ambulatory Visit: Attending: Orthopedic Surgery

## 2023-08-16 DIAGNOSIS — R2689 Other abnormalities of gait and mobility: Secondary | ICD-10-CM | POA: Insufficient documentation

## 2023-08-16 DIAGNOSIS — M25662 Stiffness of left knee, not elsewhere classified: Secondary | ICD-10-CM | POA: Diagnosis present

## 2023-08-16 DIAGNOSIS — R2681 Unsteadiness on feet: Secondary | ICD-10-CM | POA: Insufficient documentation

## 2023-08-16 DIAGNOSIS — R262 Difficulty in walking, not elsewhere classified: Secondary | ICD-10-CM | POA: Diagnosis present

## 2023-08-16 DIAGNOSIS — M5417 Radiculopathy, lumbosacral region: Secondary | ICD-10-CM | POA: Diagnosis present

## 2023-08-16 DIAGNOSIS — M6281 Muscle weakness (generalized): Secondary | ICD-10-CM | POA: Diagnosis present

## 2023-08-16 DIAGNOSIS — M62830 Muscle spasm of back: Secondary | ICD-10-CM | POA: Insufficient documentation

## 2023-08-16 DIAGNOSIS — M5459 Other low back pain: Secondary | ICD-10-CM | POA: Diagnosis present

## 2023-08-16 NOTE — Therapy (Addendum)
 OUTPATIENT PHYSICAL THERAPY LOWER EXTREMITY EVALUATION   Patient Name: [name] MRN: [id number] DOB:November 23, 1932, 89 y.o., female Today's Date: 08/16/2023  END OF SESSION:  PT End of Session - 08/16/23 1357     Visit Number 1    Date for PT Re-Evaluation 11/08/23    Authorization Type [employer]    PT Start Time 1400    PT Stop Time 1445    PT Time Calculation (min) 45 min             Past Medical History:  Diagnosis Date   Acalculous cholecystitis 08/24/2018   Arthritis    DVT of lower extremity (deep venous thrombosis) (HCC)    right leg - after back surgery in 2019   GERD (gastroesophageal reflux disease)    History of blood transfusion    Hypothyroidism    PONV (postoperative nausea and vomiting)    Ptosis of both eyelids    Vertigo    Wears glasses    reading   Past Surgical History:  Procedure Laterality Date   ABDOMINAL HYSTERECTOMY  1986   APPLICATION OF ROBOTIC ASSISTANCE FOR SPINAL PROCEDURE N/A 07/17/2017   Procedure: APPLICATION OF ROBOTIC ASSISTANCE FOR SPINAL PROCEDURE;  Surgeon: Elna Haggis, MD;  [facility];  Service: Neurosurgery;  Laterality: N/A;   APPLICATION OF ROBOTIC ASSISTANCE FOR SPINAL PROCEDURE N/A 09/01/2017   Procedure: APPLICATION OF ROBOTIC ASSISTANCE FOR SPINAL PROCEDURE;  Surgeon: Elna Haggis, MD;  [facility];  Service: Neurosurgery;  Laterality: N/A;   APPLICATION OF ROBOTIC ASSISTANCE FOR SPINAL PROCEDURE N/A 10/27/2020   Procedure: APPLICATION OF ROBOTIC ASSISTANCE FOR SPINAL PROCEDURE;  Surgeon: Elna Haggis, MD;  [facility];  Service: Neurosurgery;  Laterality: N/A;   BACK SURGERY  1986   lumb lam   BREAST BIOPSY Right 2011   CARPAL TUNNEL RELEASE  2000   right   CARPAL TUNNEL RELEASE  2012   left   CHOLECYSTECTOMY N/A 08/24/2018   Procedure: LAPAROSCOPIC CHOLECYSTECTOMY WITH INTRAOPERATIVE CHOLANGIOGRAM;  Surgeon: Boyce Byes, MD;  Location: [facility] OR;  Service: General;  Laterality: N/A;   COLONOSCOPY      CYSTOCELE REPAIR  2007   sling   DISTAL INTERPHALANGEAL JOINT FUSION Right 02/04/2014   Procedure: FUSION DISTAL INTERPHALANGEAL JOINT RIGHT INDEX FINGER ;  Surgeon: Lyanne Sample, MD;  Location: [facility];  Service: Orthopedics;  Laterality: Right;   EYE SURGERY Bilateral    Cataract surgery with lens implant   KNEE SURGERY  2009,2011   partial knee   LAPAROSCOPY N/A 08/24/2018   Procedure: LAPAROSCOPY DIAGNOSTIC;  Surgeon: Boyce Byes, MD;  Location: [facility] OR;  Service: General;  Laterality: N/A;   LUMBAR PERCUTANEOUS PEDICLE SCREW 4 LEVEL N/A 10/27/2020   Procedure: Thoracic nine-ten Laminectomy with extension of fusion from Thoracic ten to Thoracic six with segmental fixation (cemented) with pedicle screw and mazor;  Surgeon: Elna Haggis, MD;  Location: [facility] OR;  Service: Neurosurgery;  Laterality: N/A;   OPEN REDUCTION INTERNAL FIXATION (ORIF) PROXIMAL PHALANX Left 08/30/2013   Procedure: OPEN REDUCTION INTERNAL FIXATION (ORIF) PROXIMAL PHALANX FRACTURE LEFT SMALL FINGER; SPLINT RING FINGER;  Surgeon: Kemp Patter, MD;  Location: [facility];  Service: Orthopedics;  Laterality: Left;   POSTERIOR LUMBAR FUSION 4 LEVEL N/A 07/17/2017   Procedure: Thoracic Ten - Lumbar Five revison of hardware with Mazor;  Surgeon: Elna Haggis, MD;  [facility];  Service: Neurosurgery;  Laterality: N/A;  Thoracic/Lumbar   PTOSIS REPAIR Bilateral 07/27/2015  Procedure: PTOSIS REPAIR;  Surgeon: Phyllis Breeze, MD;  Location: [facility];  Service: Plastics;  Laterality: Bilateral;   SHOULDER SURGERY     rt rcr,and lt   TENOLYSIS Left 05/21/2021   Procedure: RELEASE EXTENSOR POLLICIS LONGUS LEFT;  Surgeon: Lyanne Sample, MD;  Location: [facility];  Service: Orthopedics;  Laterality: Left;   TENOLYSIS Left 06/08/2021   Procedure: RELEASE EXTENSOR POLLICIS LONGUS LEFT;  Surgeon: Lyanne Sample, MD;  Location: [facility];  Service:  Orthopedics;  Laterality: Left;   TONSILLECTOMY     TOTAL KNEE ARTHROPLASTY Right 12/23/2019   Procedure: TOTAL KNEE ARTHROPLASTY;  Surgeon: Christie Cox, MD;  Location: [facility];  Service: Orthopedics;  Laterality: Right;   TRIGGER FINGER RELEASE Left 05/21/2021   Procedure: RELEASE TRIGGER FINGER/A-1 PULLEY LEFT MIDDLE FINGER;  Surgeon: Lyanne Sample, MD;  Location: [facility];  Service: Orthopedics;  Laterality: Left;   TRIGGER FINGER RELEASE Left 06/08/2021   Procedure: RELEASE A-1 PULLEY LEFT MIDDLE FINGER;  Surgeon: Lyanne Sample, MD;  Location: [facility];  Service: Orthopedics;  Laterality: Left;   Patient Active Problem List   Diagnosis Date Noted   Myelopathy concurrent with and due to spinal stenosis of thoracic region (HCC) 10/27/2020   S/P total knee arthroplasty, right 12/23/2019   Acalculous cholecystitis 08/24/2018   Osteoarthritis of right knee    Vertigo    DDD (degenerative disc disease), cervical    Benign essential HTN    History of DVT (deep vein thrombosis)    Tachycardia    Steroid-induced hyperglycemia    Hypothyroidism    Neuropathic pain    Acute blood loss anemia    S/P lumbar fusion    Closed L5 vertebral fracture (HCC) 08/31/2017   Closed fracture of fifth lumbar vertebra (HCC) 08/30/2017   Lumbar vertebral fracture (HCC) 07/17/2017   Scoliosis 06/13/2017    PCP: Dudley Ghee   REFERRING PROVIDER: Christie Cox  REFERRING DIAG:  [phone] (ICD-10-CM) - Left knee pain      THERAPY DIAG:  No diagnosis found.  Rationale for Evaluation and Treatment: Rehabilitation  ONSET DATE: 10/17/22  SUBJECTIVE:   SUBJECTIVE STATEMENT: The knee feels pretty good, it is still sore but not like it was. I think it is coming from my back.   PERTINENT HISTORY: See above   PAIN:  Are you having pain? Yes: NPRS scale: 3-4/10 Pain location: L leg Pain description: dull, weakness Aggravating factors: laying in bed, standing for too  long, stairs Relieving factors: tylenol    PRECAUTIONS: Fall  RED FLAGS: None   WEIGHT BEARING RESTRICTIONS: No  FALLS:  Has patient fallen in last 6 months? Yes. Number of falls 1, just lost my balance and my knee buckled   LIVING ENVIRONMENT: Lives with: lives with their spouse Lives in: House/apartment Stairs: Yes: Internal: 12 steps; can reach both Has following equipment at home: Otho Blitz - 2 wheeled and Tour manager  OCCUPATION: N/A  PLOF: Independent with basic ADLs and Independent with household mobility with device  PATIENT GOALS: to get back on my feet and get back on my cane   NEXT MD VISIT: middle of June  OBJECTIVE:  Note: Objective measures were completed at Evaluation unless otherwise noted.  DIAGNOSTIC FINDINGS: X-rays are updated and independent reviewed and shows status post total knee arthroplasty hardware well aligned. She does have what appears to be a valgus deformity of the left knee however the x-rays look okay.   COGNITION:  Overall cognitive status: Within functional limits for tasks assessed     SENSATION: WFL   MUSCLE LENGTH: Hamstrings: tight in BLE, more tight in LLE  POSTURE: rounded shoulders, forward head, increased thoracic kyphosis, and flexed trunk   PALPATION: Tender in L glute and down the side of leg   LOWER EXTREMITY ROM:  Active ROM Right eval Left eval  Hip flexion    Hip extension    Hip abduction    Hip adduction    Hip internal rotation    Hip external rotation    Knee flexion  120  Knee extension  -15  Ankle dorsiflexion    Ankle plantarflexion    Ankle inversion    Ankle eversion     (Blank rows = not tested)  LOWER EXTREMITY MMT:  MMT Right eval Left eval  Hip flexion 5 4-  Hip extension    Hip abduction    Hip adduction    Hip internal rotation    Hip external rotation    Knee flexion 5 4-  Knee extension 5 4-  Ankle dorsiflexion    Ankle plantarflexion    Ankle inversion    Ankle eversion      (Blank rows = not tested)  LOWER EXTREMITY SPECIAL TESTS:  Positive slump test  LUMBAR ROM: is very limited especially with extension, she reports she is unable to do flexion per her doctor's orders   FUNCTIONAL TESTS:  5 times sit to stand: 29s needing UE to push off  Timed up and go (TUG): 22s   GAIT: Distance walked: in clinic distances Assistive device utilized: Environmental consultant - 2 wheeled Level of assistance: Complete Independence Comments: using RW, decreased knee ext on L knee                                                                                                                                TREATMENT DATE: 08/16/23- EVAL    PATIENT EDUCATION:  Education details: POC, anatomy education  Person educated: Patient Education method: Explanation Education comprehension: verbalized understanding  HOME EXERCISE PROGRAM: TBD  ASSESSMENT:  CLINICAL IMPRESSION: Patient is a 89 y.o. female who was seen today for physical therapy evaluation and treatment for LBP and L leg weakness. She states her doctor told her she has a pinched nerve. Pt has a positive slump test and has radiating symptoms down her lateral left leg. She is still ambulating with a rolling walker and reports she likes to keep it close because her L knee tends to buckle at times. However, her goal is to be able to get back to walking with a cane. Her balance is also compromised and without the walker she is unsteady. Patient has very limited range in her back and avoids bending and extending as much as possible. She has a history of back surgeries and bilateral knees. She is missing TKE on her L knee. She will benefit from PT to address  her gait and balance abnormalities as well as her lumbar radiculopathy to increase her functional independence.   OBJECTIVE IMPAIRMENTS: Abnormal gait, decreased activity tolerance, decreased balance, difficulty walking, decreased ROM, decreased strength, impaired flexibility,  improper body mechanics, postural dysfunction, and pain.   ACTIVITY LIMITATIONS: carrying, lifting, bending, squatting, stairs, and locomotion level  PARTICIPATION LIMITATIONS: shopping and yard work  PERSONAL FACTORS: Age, Past/current experiences, and Time since onset of injury/illness/exacerbation are also affecting patient's functional outcome.   REHAB POTENTIAL: Good  CLINICAL DECISION MAKING: Stable/uncomplicated  EVALUATION COMPLEXITY: Low   GOALS: Goals reviewed with patient? Yes  SHORT TERM GOALS: Target date: 09/27/23  Patient will be independent with initial HEP.  Baseline:  Goal status: INITIAL  2.  Patient will complete TUG with rolling walker <15s  Baseline: 22s Goal status: INITIAL   LONG TERM GOALS: Target date: 11/08/23  Patient will be independent with advanced/ongoing HEP to improve outcomes and carryover.  Baseline:  Goal status: INITIAL  2.  Patient will report 50-75% improvement in low back pain to improve QOL. (<2/10) Baseline: 4/10 Goal status: INITIAL  3.  Patient will report centralization of radicular symptoms.  Baseline: radicular sx in LLE Goal status: INITIAL  4.  Patient will demonstrate improved functional strength as demonstrated by 5xSTS <15s without UE use. Baseline: 29s using chair to push up Goal status: INITIAL  5.  Patient will complete TUG <20s with SPC Baseline: 22s with walker  Goal status: INITIAL   6.  Patient will be able to walk at least 346ft with [facility]  Baseline: not using cane unless short distances in home Goal status: INITIAL  PLAN:  PT FREQUENCY: 2x/week  PT DURATION: 12 weeks  PLANNED INTERVENTIONS: 97110-Therapeutic exercises, 97530- Therapeutic activity, 97112- Neuromuscular re-education, 97535- Self Care, 40981- Manual therapy, [phone]- Gait training, [phone]- Electrical stimulation (unattended), [phone]- Traction (mechanical), Patient/Family education, Balance training, Stair training, Taping, Dry Needling,  Joint mobilization, Spinal mobilization, Cryotherapy, and Moist heat  PLAN FOR NEXT SESSION: stretching for low back and LE's, balance training, strengthening LLE, HEP initiation    [employer], PT 08/16/2023, 3:02 PM

## 2023-08-21 ENCOUNTER — Encounter: Payer: Self-pay | Admitting: Physical Therapy

## 2023-08-21 ENCOUNTER — Ambulatory Visit: Admitting: Physical Therapy

## 2023-08-21 DIAGNOSIS — R2681 Unsteadiness on feet: Secondary | ICD-10-CM

## 2023-08-21 DIAGNOSIS — M5459 Other low back pain: Secondary | ICD-10-CM

## 2023-08-21 DIAGNOSIS — M6281 Muscle weakness (generalized): Secondary | ICD-10-CM

## 2023-08-21 DIAGNOSIS — M5417 Radiculopathy, lumbosacral region: Secondary | ICD-10-CM | POA: Diagnosis not present

## 2023-08-21 DIAGNOSIS — R262 Difficulty in walking, not elsewhere classified: Secondary | ICD-10-CM

## 2023-08-21 NOTE — Therapy (Signed)
 OUTPATIENT PHYSICAL THERAPY LOWER EXTREMITY EVALUATION   Patient Name: [name] MRN: [id number] DOB:[birth year], 89 y.o., female Today's Date: 08/21/2023  END OF SESSION:  PT End of Session - 08/21/23 1512     Visit Number 2    Date for PT Re-Evaluation 11/08/23    PT Start Time 1515    PT Stop Time 1600    PT Time Calculation (min) 45 min    Activity Tolerance Patient tolerated treatment well    Behavior During Therapy [facility] for tasks assessed/performed             Past Medical History:  Diagnosis Date   Acalculous cholecystitis 08/24/2018   Arthritis    DVT of lower extremity (deep venous thrombosis) (HCC)    right leg - after back surgery in 2019   GERD (gastroesophageal reflux disease)    History of blood transfusion    Hypothyroidism    PONV (postoperative nausea and vomiting)    Ptosis of both eyelids    Vertigo    Wears glasses    reading   Past Surgical History:  Procedure Laterality Date   ABDOMINAL HYSTERECTOMY  1986   APPLICATION OF ROBOTIC ASSISTANCE FOR SPINAL PROCEDURE N/A 07/17/2017   Procedure: APPLICATION OF ROBOTIC ASSISTANCE FOR SPINAL PROCEDURE;  Surgeon: Elna Haggis, MD;  [facility];  Service: Neurosurgery;  Laterality: N/A;   APPLICATION OF ROBOTIC ASSISTANCE FOR SPINAL PROCEDURE N/A 09/01/2017   Procedure: APPLICATION OF ROBOTIC ASSISTANCE FOR SPINAL PROCEDURE;  Surgeon: Elna Haggis, MD;  [facility];  Service: Neurosurgery;  Laterality: N/A;   APPLICATION OF ROBOTIC ASSISTANCE FOR SPINAL PROCEDURE N/A 10/27/2020   Procedure: APPLICATION OF ROBOTIC ASSISTANCE FOR SPINAL PROCEDURE;  Surgeon: Elna Haggis, MD;  [facility];  Service: Neurosurgery;  Laterality: N/A;   BACK SURGERY  1986   lumb lam   BREAST BIOPSY Right 2011   CARPAL TUNNEL RELEASE  2000   right   CARPAL TUNNEL RELEASE  2012   left   CHOLECYSTECTOMY N/A 08/24/2018   Procedure: LAPAROSCOPIC CHOLECYSTECTOMY WITH INTRAOPERATIVE CHOLANGIOGRAM;  Surgeon:  Boyce Byes, MD;  Location: [facility] OR;  Service: General;  Laterality: N/A;   COLONOSCOPY     CYSTOCELE REPAIR  2007   sling   DISTAL INTERPHALANGEAL JOINT FUSION Right 02/04/2014   Procedure: FUSION DISTAL INTERPHALANGEAL JOINT RIGHT INDEX FINGER ;  Surgeon: Lyanne Sample, MD;  Location: [facility];  Service: Orthopedics;  Laterality: Right;   EYE SURGERY Bilateral    Cataract surgery with lens implant   KNEE SURGERY  2009,2011   partial knee   LAPAROSCOPY N/A 08/24/2018   Procedure: LAPAROSCOPY DIAGNOSTIC;  Surgeon: Boyce Byes, MD;  Location: [facility] OR;  Service: General;  Laterality: N/A;   LUMBAR PERCUTANEOUS PEDICLE SCREW 4 LEVEL N/A 10/27/2020   Procedure: Thoracic nine-ten Laminectomy with extension of fusion from Thoracic ten to Thoracic six with segmental fixation (cemented) with pedicle screw and mazor;  Surgeon: Elna Haggis, MD;  Location: [facility] OR;  Service: Neurosurgery;  Laterality: N/A;   OPEN REDUCTION INTERNAL FIXATION (ORIF) PROXIMAL PHALANX Left 08/30/2013   Procedure: OPEN REDUCTION INTERNAL FIXATION (ORIF) PROXIMAL PHALANX FRACTURE LEFT SMALL FINGER; SPLINT RING FINGER;  Surgeon: Kemp Patter, MD;  Location: [facility];  Service: Orthopedics;  Laterality: Left;   POSTERIOR LUMBAR FUSION 4 LEVEL N/A 07/17/2017   Procedure: Thoracic Ten - Lumbar Five revison of hardware with Mazor;  Surgeon: Elna Haggis, MD;  [facility];  Service: Neurosurgery;  Laterality: N/A;  Thoracic/Lumbar   PTOSIS REPAIR Bilateral 07/27/2015   Procedure: PTOSIS REPAIR;  Surgeon: Phyllis Breeze, MD;  Location: [facility];  Service: Plastics;  Laterality: Bilateral;   SHOULDER SURGERY     rt rcr,and lt   TENOLYSIS Left 05/21/2021   Procedure: RELEASE EXTENSOR POLLICIS LONGUS LEFT;  Surgeon: Lyanne Sample, MD;  Location: [facility];  Service: Orthopedics;  Laterality: Left;   TENOLYSIS Left 06/08/2021   Procedure: RELEASE EXTENSOR POLLICIS  LONGUS LEFT;  Surgeon: Lyanne Sample, MD;  Location: [facility];  Service: Orthopedics;  Laterality: Left;   TONSILLECTOMY     TOTAL KNEE ARTHROPLASTY Right 12/23/2019   Procedure: TOTAL KNEE ARTHROPLASTY;  Surgeon: Christie Cox, MD;  Location: [facility];  Service: Orthopedics;  Laterality: Right;   TRIGGER FINGER RELEASE Left 05/21/2021   Procedure: RELEASE TRIGGER FINGER/A-1 PULLEY LEFT MIDDLE FINGER;  Surgeon: Lyanne Sample, MD;  Location: [facility];  Service: Orthopedics;  Laterality: Left;   TRIGGER FINGER RELEASE Left 06/08/2021   Procedure: RELEASE A-1 PULLEY LEFT MIDDLE FINGER;  Surgeon: Lyanne Sample, MD;  Location: [facility];  Service: Orthopedics;  Laterality: Left;   Patient Active Problem List   Diagnosis Date Noted   Myelopathy concurrent with and due to spinal stenosis of thoracic region (HCC) 10/27/2020   S/P total knee arthroplasty, right 12/23/2019   Acalculous cholecystitis 08/24/2018   Osteoarthritis of right knee    Vertigo    DDD (degenerative disc disease), cervical    Benign essential HTN    History of DVT (deep vein thrombosis)    Tachycardia    Steroid-induced hyperglycemia    Hypothyroidism    Neuropathic pain    Acute blood loss anemia    S/P lumbar fusion    Closed L5 vertebral fracture (HCC) 08/31/2017   Closed fracture of fifth lumbar vertebra (HCC) 08/30/2017   Lumbar vertebral fracture (HCC) 07/17/2017   Scoliosis 06/13/2017    PCP: Dudley Ghee   REFERRING PROVIDER: Christie Cox  REFERRING DIAG:  [phone] (ICD-10-CM) - Left knee pain      THERAPY DIAG:  Other low back pain  Muscle weakness (generalized)  Difficulty in walking, not elsewhere classified  Unsteady gait  Rationale for Evaluation and Treatment: Rehabilitation  ONSET DATE: 10/17/22  SUBJECTIVE:   SUBJECTIVE STATEMENT: "I got a pinched nerve in my hip, down the L leg"  PERTINENT HISTORY: See above   PAIN:  Are you having  pain? Yes: NPRS scale: 6/10 Pain location: L leg Pain description: dull, weakness Aggravating factors: laying in bed, standing for too long, stairs Relieving factors: tylenol    PRECAUTIONS: Fall  RED FLAGS: None   WEIGHT BEARING RESTRICTIONS: No  FALLS:  Has patient fallen in last 6 months? Yes. Number of falls 1, just lost my balance and my knee buckled   LIVING ENVIRONMENT: Lives with: lives with their spouse Lives in: House/apartment Stairs: Yes: Internal: 12 steps; can reach both Has following equipment at home: Otho Blitz - 2 wheeled and Tour manager  OCCUPATION: N/A  PLOF: Independent with basic ADLs and Independent with household mobility with device  PATIENT GOALS: to get back on my feet and get back on my cane   NEXT MD VISIT: middle of June  OBJECTIVE:  Note: Objective measures were completed at Evaluation unless otherwise noted.  DIAGNOSTIC FINDINGS: X-rays are updated and independent reviewed and shows status post total knee arthroplasty hardware well aligned. She does have what appears to  be a valgus deformity of the left knee however the x-rays look okay.   COGNITION: Overall cognitive status: Within functional limits for tasks assessed     SENSATION: WFL   MUSCLE LENGTH: Hamstrings: tight in BLE, more tight in LLE  POSTURE: rounded shoulders, forward head, increased thoracic kyphosis, and flexed trunk   PALPATION: Tender in L glute and down the side of leg   LOWER EXTREMITY ROM:  Active ROM Right eval Left eval  Hip flexion    Hip extension    Hip abduction    Hip adduction    Hip internal rotation    Hip external rotation    Knee flexion  120  Knee extension  -15  Ankle dorsiflexion    Ankle plantarflexion    Ankle inversion    Ankle eversion     (Blank rows = not tested)  LOWER EXTREMITY MMT:  MMT Right eval Left eval  Hip flexion 5 4-  Hip extension    Hip abduction    Hip adduction    Hip internal rotation    Hip external  rotation    Knee flexion 5 4-  Knee extension 5 4-  Ankle dorsiflexion    Ankle plantarflexion    Ankle inversion    Ankle eversion     (Blank rows = not tested)  LOWER EXTREMITY SPECIAL TESTS:  Positive slump test  LUMBAR ROM: is very limited especially with extension, she reports she is unable to do flexion per her doctor's orders   FUNCTIONAL TESTS:  5 times sit to stand: 29s needing UE to push off  Timed up and go (TUG): 22s   GAIT: Distance walked: in clinic distances Assistive device utilized: Environmental consultant - 2 wheeled Level of assistance: Complete Independence Comments: using RW, decreased knee ext on L knee                                                                                                                                TREATMENT DATE: . 08/21/23 NuStep L 5 x 6 min Rows green 2x10 Standing Shoulder Ext green 2x10  Hip abd green 2x10  Hip add ball squeeze 2x10  Side steps at mat HHA x2 then x1 Standing hip Abd w/ RW 2lb 2x10  LAQ 2lb 2x15 HS curls green 2x10   08/16/23- EVAL    PATIENT EDUCATION:  Education details: POC, anatomy education  Person educated: Patient Education method: Explanation Education comprehension: verbalized understanding  HOME EXERCISE PROGRAM: TBD  ASSESSMENT:  CLINICAL IMPRESSION: Patient is a 89 y.o. female who was seen today for physical therapy treatment for LBP and L leg weakness.  Skilled therapy session focused on core strength and balance. Lle weakness noetd throughout session, but did seem stronger than R with HS curls. Postural cue seeded to maintain erect posture with rows and extensions Cue needed not to deag LLW with side steps .She will benefit from PT to address her gait  and balance abnormalities as well as her lumbar radiculopathy to increase her functional independence.   OBJECTIVE IMPAIRMENTS: Abnormal gait, decreased activity tolerance, decreased balance, difficulty walking, decreased ROM, decreased strength,  impaired flexibility, improper body mechanics, postural dysfunction, and pain.   ACTIVITY LIMITATIONS: carrying, lifting, bending, squatting, stairs, and locomotion level  PARTICIPATION LIMITATIONS: shopping and yard work  PERSONAL FACTORS: Age, Past/current experiences, and Time since onset of injury/illness/exacerbation are also affecting patient's functional outcome.   REHAB POTENTIAL: Good  CLINICAL DECISION MAKING: Stable/uncomplicated  EVALUATION COMPLEXITY: Low   GOALS: Goals reviewed with patient? Yes  SHORT TERM GOALS: Target date: 09/27/23  Patient will be independent with initial HEP.  Baseline:  Goal status: INITIAL  2.  Patient will complete TUG with rolling walker <15s  Baseline: 22s Goal status: INITIAL   LONG TERM GOALS: Target date: 11/08/23  Patient will be independent with advanced/ongoing HEP to improve outcomes and carryover.  Baseline:  Goal status: INITIAL  2.  Patient will report 50-75% improvement in low back pain to improve QOL. (<2/10) Baseline: 4/10 Goal status: INITIAL  3.  Patient will report centralization of radicular symptoms.  Baseline: radicular sx in LLE Goal status: INITIAL  4.  Patient will demonstrate improved functional strength as demonstrated by 5xSTS <15s without UE use. Baseline: 29s using chair to push up Goal status: INITIAL  5.  Patient will complete TUG <20s with SPC Baseline: 22s with walker  Goal status: INITIAL   6.  Patient will be able to walk at least 338ft with [facility]  Baseline: not using cane unless short distances in home Goal status: INITIAL  PLAN:  PT FREQUENCY: 2x/week  PT DURATION: 12 weeks  PLANNED INTERVENTIONS: 97110-Therapeutic exercises, 97530- Therapeutic activity, 97112- Neuromuscular re-education, 97535- Self Care, 16109- Manual therapy, [phone]- Gait training, [phone]- Electrical stimulation (unattended), [phone]- Traction (mechanical), Patient/Family education, Balance training, Stair training,  Taping, Dry Needling, Joint mobilization, Spinal mobilization, Cryotherapy, and Moist heat  PLAN FOR NEXT SESSION: stretching for low back and LE's, balance training, strengthening LLE, HEP initiation    Ollen Beverage, PTA 08/21/2023, 3:17 PM

## 2023-08-23 NOTE — Therapy (Signed)
 OUTPATIENT PHYSICAL THERAPY LOWER EXTREMITY TREATMENT   Patient Name: [name] MRN: [id number] DOB:[birth year], 89 y.o., female Today's Date: 08/24/2023  END OF SESSION:  PT End of Session - 08/24/23 1459     Visit Number 3    Date for PT Re-Evaluation 11/08/23    PT Start Time 1500    PT Stop Time 1545    PT Time Calculation (min) 45 min    Activity Tolerance Patient tolerated treatment well    Behavior During Therapy [facility] for tasks assessed/performed              Past Medical History:  Diagnosis Date   Acalculous cholecystitis 08/24/2018   Arthritis    DVT of lower extremity (deep venous thrombosis) (HCC)    right leg - after back surgery in 2019   GERD (gastroesophageal reflux disease)    History of blood transfusion    Hypothyroidism    PONV (postoperative nausea and vomiting)    Ptosis of both eyelids    Vertigo    Wears glasses    reading   Past Surgical History:  Procedure Laterality Date   ABDOMINAL HYSTERECTOMY  1986   APPLICATION OF ROBOTIC ASSISTANCE FOR SPINAL PROCEDURE N/A 07/17/2017   Procedure: APPLICATION OF ROBOTIC ASSISTANCE FOR SPINAL PROCEDURE;  Surgeon: Elna Haggis, MD;  Location: [facility] OR;  Service: Neurosurgery;  Laterality: N/A;   APPLICATION OF ROBOTIC ASSISTANCE FOR SPINAL PROCEDURE N/A 09/01/2017   Procedure: APPLICATION OF ROBOTIC ASSISTANCE FOR SPINAL PROCEDURE;  Surgeon: Elna Haggis, MD;  Location: [facility] OR;  Service: Neurosurgery;  Laterality: N/A;   APPLICATION OF ROBOTIC ASSISTANCE FOR SPINAL PROCEDURE N/A 10/27/2020   Procedure: APPLICATION OF ROBOTIC ASSISTANCE FOR SPINAL PROCEDURE;  Surgeon: Elna Haggis, MD;  Location: [facility] OR;  Service: Neurosurgery;  Laterality: N/A;   BACK SURGERY  1986   lumb lam   BREAST BIOPSY Right 2011   CARPAL TUNNEL RELEASE  2000   right   CARPAL TUNNEL RELEASE  2012   left   CHOLECYSTECTOMY N/A 08/24/2018   Procedure: LAPAROSCOPIC CHOLECYSTECTOMY WITH INTRAOPERATIVE CHOLANGIOGRAM;  Surgeon:  Boyce Byes, MD;  Location: [facility] OR;  Service: General;  Laterality: N/A;   COLONOSCOPY     CYSTOCELE REPAIR  2007   sling   DISTAL INTERPHALANGEAL JOINT FUSION Right 02/04/2014   Procedure: FUSION DISTAL INTERPHALANGEAL JOINT RIGHT INDEX FINGER ;  Surgeon: Lyanne Sample, MD;  Location: [facility];  Service: Orthopedics;  Laterality: Right;   EYE SURGERY Bilateral    Cataract surgery with lens implant   KNEE SURGERY  2009,2011   partial knee   LAPAROSCOPY N/A 08/24/2018   Procedure: LAPAROSCOPY DIAGNOSTIC;  Surgeon: Boyce Byes, MD;  Location: [facility] OR;  Service: General;  Laterality: N/A;   LUMBAR PERCUTANEOUS PEDICLE SCREW 4 LEVEL N/A 10/27/2020   Procedure: Thoracic nine-ten Laminectomy with extension of fusion from Thoracic ten to Thoracic six with segmental fixation (cemented) with pedicle screw and mazor;  Surgeon: Elna Haggis, MD;  Location: [facility] OR;  Service: Neurosurgery;  Laterality: N/A;   OPEN REDUCTION INTERNAL FIXATION (ORIF) PROXIMAL PHALANX Left 08/30/2013   Procedure: OPEN REDUCTION INTERNAL FIXATION (ORIF) PROXIMAL PHALANX FRACTURE LEFT SMALL FINGER; SPLINT RING FINGER;  Surgeon: Kemp Patter, MD;  Location: [facility];  Service: Orthopedics;  Laterality: Left;   POSTERIOR LUMBAR FUSION 4 LEVEL N/A 07/17/2017   Procedure: Thoracic Ten - Lumbar Five revison of hardware with Mazor;  Surgeon: Elna Haggis, MD;  Location: [facility] OR;  Service:  Neurosurgery;  Laterality: N/A;  Thoracic/Lumbar   PTOSIS REPAIR Bilateral 07/27/2015   Procedure: PTOSIS REPAIR;  Surgeon: Phyllis Breeze, MD;  Location: [facility];  Service: Plastics;  Laterality: Bilateral;   SHOULDER SURGERY     rt rcr,and lt   TENOLYSIS Left 05/21/2021   Procedure: RELEASE EXTENSOR POLLICIS LONGUS LEFT;  Surgeon: Lyanne Sample, MD;  Location: [facility];  Service: Orthopedics;  Laterality: Left;   TENOLYSIS Left 06/08/2021   Procedure: RELEASE EXTENSOR POLLICIS  LONGUS LEFT;  Surgeon: Lyanne Sample, MD;  Location: [facility];  Service: Orthopedics;  Laterality: Left;   TONSILLECTOMY     TOTAL KNEE ARTHROPLASTY Right 12/23/2019   Procedure: TOTAL KNEE ARTHROPLASTY;  Surgeon: Christie Cox, MD;  Location: [facility];  Service: Orthopedics;  Laterality: Right;   TRIGGER FINGER RELEASE Left 05/21/2021   Procedure: RELEASE TRIGGER FINGER/A-1 PULLEY LEFT MIDDLE FINGER;  Surgeon: Lyanne Sample, MD;  Location: [facility];  Service: Orthopedics;  Laterality: Left;   TRIGGER FINGER RELEASE Left 06/08/2021   Procedure: RELEASE A-1 PULLEY LEFT MIDDLE FINGER;  Surgeon: Lyanne Sample, MD;  Location: [facility];  Service: Orthopedics;  Laterality: Left;   Patient Active Problem List   Diagnosis Date Noted   Myelopathy concurrent with and due to spinal stenosis of thoracic region (HCC) 10/27/2020   S/P total knee arthroplasty, right 12/23/2019   Acalculous cholecystitis 08/24/2018   Osteoarthritis of right knee    Vertigo    DDD (degenerative disc disease), cervical    Benign essential HTN    History of DVT (deep vein thrombosis)    Tachycardia    Steroid-induced hyperglycemia    Hypothyroidism    Neuropathic pain    Acute blood loss anemia    S/P lumbar fusion    Closed L5 vertebral fracture (HCC) 08/31/2017   Closed fracture of fifth lumbar vertebra (HCC) 08/30/2017   Lumbar vertebral fracture (HCC) 07/17/2017   Scoliosis 06/13/2017    PCP: Dudley Ghee   REFERRING PROVIDER: Christie Cox  REFERRING DIAG:  [phone] (ICD-10-CM) - Left knee pain      THERAPY DIAG:  Other low back pain  Muscle weakness (generalized)  Difficulty in walking, not elsewhere classified  Unsteady gait  Radiculopathy, lumbosacral region  Other abnormalities of gait and mobility  Muscle spasm of back  Stiffness of left knee, not elsewhere classified  Rationale for Evaluation and Treatment: Rehabilitation  ONSET DATE:  10/17/22  SUBJECTIVE:   SUBJECTIVE STATEMENT: "I got a pinched nerve in my hip, down the L leg"  PERTINENT HISTORY: See above   PAIN:  Are you having pain? Yes: NPRS scale: 6/10 Pain location: L leg Pain description: dull, weakness Aggravating factors: laying in bed, standing for too long, stairs Relieving factors: tylenol    PRECAUTIONS: Fall  RED FLAGS: None   WEIGHT BEARING RESTRICTIONS: No  FALLS:  Has patient fallen in last 6 months? Yes. Number of falls 1, just lost my balance and my knee buckled   LIVING ENVIRONMENT: Lives with: lives with their spouse Lives in: House/apartment Stairs: Yes: Internal: 12 steps; can reach both Has following equipment at home: Otho Blitz - 2 wheeled and Tour manager  OCCUPATION: N/A  PLOF: Independent with basic ADLs and Independent with household mobility with device  PATIENT GOALS: to get back on my feet and get back on my cane   NEXT MD VISIT: middle of June  OBJECTIVE:  Note: Objective measures were completed at Evaluation unless otherwise  noted.  DIAGNOSTIC FINDINGS: X-rays are updated and independent reviewed and shows status post total knee arthroplasty hardware well aligned. She does have what appears to be a valgus deformity of the left knee however the x-rays look okay.   COGNITION: Overall cognitive status: Within functional limits for tasks assessed     SENSATION: WFL   MUSCLE LENGTH: Hamstrings: tight in BLE, more tight in LLE  POSTURE: rounded shoulders, forward head, increased thoracic kyphosis, and flexed trunk   PALPATION: Tender in L glute and down the side of leg   LOWER EXTREMITY ROM:  Active ROM Right eval Left eval  Hip flexion    Hip extension    Hip abduction    Hip adduction    Hip internal rotation    Hip external rotation    Knee flexion  120  Knee extension  -15  Ankle dorsiflexion    Ankle plantarflexion    Ankle inversion    Ankle eversion     (Blank rows = not tested)  LOWER  EXTREMITY MMT:  MMT Right eval Left eval  Hip flexion 5 4-  Hip extension    Hip abduction    Hip adduction    Hip internal rotation    Hip external rotation    Knee flexion 5 4-  Knee extension 5 4-  Ankle dorsiflexion    Ankle plantarflexion    Ankle inversion    Ankle eversion     (Blank rows = not tested)  LOWER EXTREMITY SPECIAL TESTS:  Positive slump test  LUMBAR ROM: is very limited especially with extension, she reports she is unable to do flexion per her doctor's orders   FUNCTIONAL TESTS:  5 times sit to stand: 29s needing UE to push off  Timed up and go (TUG): 22s   GAIT: Distance walked: in clinic distances Assistive device utilized: Environmental consultant - 2 wheeled Level of assistance: Complete Independence Comments: using RW, decreased knee ext on L knee                                                                                                                                TREATMENT DATE:  08/24/23 NuStep L5x21mins  Hip abduction green 2x10 STS 2x10 2# marching 20 reps alt x2  2# hip abd and ext with RW 2x10 Passive supine LE stretches HS, LTR, SKTC, glutes, figure 4   Supine feet on pball rotations and knees to chest x10   08/21/23 NuStep L 5 x 6 min Rows green 2x10 Standing Shoulder Ext green 2x10  Hip abd green 2x10  Hip add ball squeeze 2x10  Side steps at mat HHA x2 then x1 Standing hip Abd w/ RW 2lb 2x10  LAQ 2lb 2x15 HS curls green 2x10   08/16/23- EVAL    PATIENT EDUCATION:  Education details: POC, anatomy education  Person educated: Patient Education method: Explanation Education comprehension: verbalized understanding  HOME EXERCISE PROGRAM: TBD  ASSESSMENT:  CLINICAL IMPRESSION: Patient is a 89 y.o. female who was seen today for physical therapy treatment for LBP and L leg weakness.  Skilled therapy session focused on LE strength and flexibility. Pt reports stretching feels good. She is very tight with figure 4 stretch on both  sides. She will benefit from PT to address her gait and balance abnormalities as well as her lumbar radiculopathy to increase her functional independence.   OBJECTIVE IMPAIRMENTS: Abnormal gait, decreased activity tolerance, decreased balance, difficulty walking, decreased ROM, decreased strength, impaired flexibility, improper body mechanics, postural dysfunction, and pain.   ACTIVITY LIMITATIONS: carrying, lifting, bending, squatting, stairs, and locomotion level  PARTICIPATION LIMITATIONS: shopping and yard work  PERSONAL FACTORS: Age, Past/current experiences, and Time since onset of injury/illness/exacerbation are also affecting patient's functional outcome.   REHAB POTENTIAL: Good  CLINICAL DECISION MAKING: Stable/uncomplicated  EVALUATION COMPLEXITY: Low   GOALS: Goals reviewed with patient? Yes  SHORT TERM GOALS: Target date: 09/27/23  Patient will be independent with initial HEP.  Baseline:  Goal status: INITIAL  2.  Patient will complete TUG with rolling walker <15s  Baseline: 22s Goal status: INITIAL   LONG TERM GOALS: Target date: 11/08/23  Patient will be independent with advanced/ongoing HEP to improve outcomes and carryover.  Baseline:  Goal status: INITIAL  2.  Patient will report 50-75% improvement in low back pain to improve QOL. (<2/10) Baseline: 4/10 Goal status: INITIAL  3.  Patient will report centralization of radicular symptoms.  Baseline: radicular sx in LLE Goal status: INITIAL  4.  Patient will demonstrate improved functional strength as demonstrated by 5xSTS <15s without UE use. Baseline: 29s using chair to push up Goal status: INITIAL  5.  Patient will complete TUG <20s with SPC Baseline: 22s with walker  Goal status: INITIAL   6.  Patient will be able to walk at least 377ft with [facility]  Baseline: not using cane unless short distances in home Goal status: INITIAL  PLAN:  PT FREQUENCY: 2x/week  PT DURATION: 12 weeks  PLANNED  INTERVENTIONS: 97110-Therapeutic exercises, 97530- Therapeutic activity, 97112- Neuromuscular re-education, 97535- Self Care, 16109- Manual therapy, [phone]- Gait training, [phone]- Electrical stimulation (unattended), [phone]- Traction (mechanical), Patient/Family education, Balance training, Stair training, Taping, Dry Needling, Joint mobilization, Spinal mobilization, Cryotherapy, and Moist heat  PLAN FOR NEXT SESSION: stretching for low back and LE's, balance training, strengthening LLE, HEP initiation    Donavon Fudge, PT 08/24/2023, 3:42 PM

## 2023-08-24 ENCOUNTER — Ambulatory Visit

## 2023-08-24 DIAGNOSIS — R2689 Other abnormalities of gait and mobility: Secondary | ICD-10-CM

## 2023-08-24 DIAGNOSIS — M5459 Other low back pain: Secondary | ICD-10-CM

## 2023-08-24 DIAGNOSIS — M25662 Stiffness of left knee, not elsewhere classified: Secondary | ICD-10-CM

## 2023-08-24 DIAGNOSIS — R2681 Unsteadiness on feet: Secondary | ICD-10-CM

## 2023-08-24 DIAGNOSIS — M6281 Muscle weakness (generalized): Secondary | ICD-10-CM

## 2023-08-24 DIAGNOSIS — R262 Difficulty in walking, not elsewhere classified: Secondary | ICD-10-CM

## 2023-08-24 DIAGNOSIS — M62830 Muscle spasm of back: Secondary | ICD-10-CM

## 2023-08-24 DIAGNOSIS — M5417 Radiculopathy, lumbosacral region: Secondary | ICD-10-CM | POA: Diagnosis not present

## 2023-08-29 ENCOUNTER — Ambulatory Visit: Admitting: Physical Therapy

## 2023-08-31 ENCOUNTER — Ambulatory Visit

## 2023-08-31 DIAGNOSIS — R2689 Other abnormalities of gait and mobility: Secondary | ICD-10-CM

## 2023-08-31 DIAGNOSIS — R262 Difficulty in walking, not elsewhere classified: Secondary | ICD-10-CM

## 2023-08-31 DIAGNOSIS — M5417 Radiculopathy, lumbosacral region: Secondary | ICD-10-CM | POA: Diagnosis not present

## 2023-08-31 DIAGNOSIS — R2681 Unsteadiness on feet: Secondary | ICD-10-CM

## 2023-08-31 DIAGNOSIS — M6281 Muscle weakness (generalized): Secondary | ICD-10-CM

## 2023-08-31 DIAGNOSIS — M5459 Other low back pain: Secondary | ICD-10-CM

## 2023-08-31 NOTE — Therapy (Signed)
 OUTPATIENT PHYSICAL THERAPY LOWER EXTREMITY TREATMENT   Patient Name: [name] MRN: [id number] DOB:10/16/1932, 89 y.o., female Today's Date: 08/31/2023  END OF SESSION:  PT End of Session - 08/31/23 1458     Visit Number 4    Date for PT Re-Evaluation 11/08/23    PT Start Time 1500    PT Stop Time 1545    PT Time Calculation (min) 45 min    Activity Tolerance Patient tolerated treatment well    Behavior During Therapy [facility] for tasks assessed/performed               Past Medical History:  Diagnosis Date   Acalculous cholecystitis 08/24/2018   Arthritis    DVT of lower extremity (deep venous thrombosis) (HCC)    right leg - after back surgery in 2019   GERD (gastroesophageal reflux disease)    History of blood transfusion    Hypothyroidism    PONV (postoperative nausea and vomiting)    Ptosis of both eyelids    Vertigo    Wears glasses    reading   Past Surgical History:  Procedure Laterality Date   ABDOMINAL HYSTERECTOMY  1986   APPLICATION OF ROBOTIC ASSISTANCE FOR SPINAL PROCEDURE N/A 07/17/2017   Procedure: APPLICATION OF ROBOTIC ASSISTANCE FOR SPINAL PROCEDURE;  Surgeon: Elna Haggis, MD;  Location: [facility] OR;  Service: Neurosurgery;  Laterality: N/A;   APPLICATION OF ROBOTIC ASSISTANCE FOR SPINAL PROCEDURE N/A 09/01/2017   Procedure: APPLICATION OF ROBOTIC ASSISTANCE FOR SPINAL PROCEDURE;  Surgeon: Elna Haggis, MD;  Location: [facility] OR;  Service: Neurosurgery;  Laterality: N/A;   APPLICATION OF ROBOTIC ASSISTANCE FOR SPINAL PROCEDURE N/A 10/27/2020   Procedure: APPLICATION OF ROBOTIC ASSISTANCE FOR SPINAL PROCEDURE;  Surgeon: Elna Haggis, MD;  Location: [facility] OR;  Service: Neurosurgery;  Laterality: N/A;   BACK SURGERY  1986   lumb lam   BREAST BIOPSY Right 2011   CARPAL TUNNEL RELEASE  2000   right   CARPAL TUNNEL RELEASE  2012   left   CHOLECYSTECTOMY N/A 08/24/2018   Procedure: LAPAROSCOPIC CHOLECYSTECTOMY WITH INTRAOPERATIVE CHOLANGIOGRAM;   Surgeon: Boyce Byes, MD;  Location: [facility] OR;  Service: General;  Laterality: N/A;   COLONOSCOPY     CYSTOCELE REPAIR  2007   sling   DISTAL INTERPHALANGEAL JOINT FUSION Right 02/04/2014   Procedure: FUSION DISTAL INTERPHALANGEAL JOINT RIGHT INDEX FINGER ;  Surgeon: Lyanne Sample, MD;  Location: [facility];  Service: Orthopedics;  Laterality: Right;   EYE SURGERY Bilateral    Cataract surgery with lens implant   KNEE SURGERY  2009,2011   partial knee   LAPAROSCOPY N/A 08/24/2018   Procedure: LAPAROSCOPY DIAGNOSTIC;  Surgeon: Boyce Byes, MD;  Location: [facility] OR;  Service: General;  Laterality: N/A;   LUMBAR PERCUTANEOUS PEDICLE SCREW 4 LEVEL N/A 10/27/2020   Procedure: Thoracic nine-ten Laminectomy with extension of fusion from Thoracic ten to Thoracic six with segmental fixation (cemented) with pedicle screw and mazor;  Surgeon: Elna Haggis, MD;  Location: [facility] OR;  Service: Neurosurgery;  Laterality: N/A;   OPEN REDUCTION INTERNAL FIXATION (ORIF) PROXIMAL PHALANX Left 08/30/2013   Procedure: OPEN REDUCTION INTERNAL FIXATION (ORIF) PROXIMAL PHALANX FRACTURE LEFT SMALL FINGER; SPLINT RING FINGER;  Surgeon: Kemp Patter, MD;  Location: [facility];  Service: Orthopedics;  Laterality: Left;   POSTERIOR LUMBAR FUSION 4 LEVEL N/A 07/17/2017   Procedure: Thoracic Ten - Lumbar Five revison of hardware with Mazor;  Surgeon: Elna Haggis, MD;  Location: [facility] OR;  Service: Neurosurgery;  Laterality: N/A;  Thoracic/Lumbar   PTOSIS REPAIR Bilateral 07/27/2015   Procedure: PTOSIS REPAIR;  Surgeon: Phyllis Breeze, MD;  Location: [facility];  Service: Plastics;  Laterality: Bilateral;   SHOULDER SURGERY     rt rcr,and lt   TENOLYSIS Left 05/21/2021   Procedure: RELEASE EXTENSOR POLLICIS LONGUS LEFT;  Surgeon: Lyanne Sample, MD;  Location: [facility];  Service: Orthopedics;  Laterality: Left;   TENOLYSIS Left 06/08/2021   Procedure: RELEASE EXTENSOR  POLLICIS LONGUS LEFT;  Surgeon: Lyanne Sample, MD;  Location: [facility];  Service: Orthopedics;  Laterality: Left;   TONSILLECTOMY     TOTAL KNEE ARTHROPLASTY Right 12/23/2019   Procedure: TOTAL KNEE ARTHROPLASTY;  Surgeon: Christie Cox, MD;  Location: [facility];  Service: Orthopedics;  Laterality: Right;   TRIGGER FINGER RELEASE Left 05/21/2021   Procedure: RELEASE TRIGGER FINGER/A-1 PULLEY LEFT MIDDLE FINGER;  Surgeon: Lyanne Sample, MD;  Location: [facility];  Service: Orthopedics;  Laterality: Left;   TRIGGER FINGER RELEASE Left 06/08/2021   Procedure: RELEASE A-1 PULLEY LEFT MIDDLE FINGER;  Surgeon: Lyanne Sample, MD;  Location: [facility];  Service: Orthopedics;  Laterality: Left;   Patient Active Problem List   Diagnosis Date Noted   Myelopathy concurrent with and due to spinal stenosis of thoracic region (HCC) 10/27/2020   S/P total knee arthroplasty, right 12/23/2019   Acalculous cholecystitis 08/24/2018   Osteoarthritis of right knee    Vertigo    DDD (degenerative disc disease), cervical    Benign essential HTN    History of DVT (deep vein thrombosis)    Tachycardia    Steroid-induced hyperglycemia    Hypothyroidism    Neuropathic pain    Acute blood loss anemia    S/P lumbar fusion    Closed L5 vertebral fracture (HCC) 08/31/2017   Closed fracture of fifth lumbar vertebra (HCC) 08/30/2017   Lumbar vertebral fracture (HCC) 07/17/2017   Scoliosis 06/13/2017    PCP: Dudley Ghee   REFERRING PROVIDER: Christie Cox  REFERRING DIAG:  [phone] (ICD-10-CM) - Left knee pain      THERAPY DIAG:  Other low back pain  Muscle weakness (generalized)  Difficulty in walking, not elsewhere classified  Unsteady gait  Radiculopathy, lumbosacral region  Other abnormalities of gait and mobility  Rationale for Evaluation and Treatment: Rehabilitation  ONSET DATE: 10/17/22  SUBJECTIVE:   SUBJECTIVE STATEMENT: I am not any better.  Last night it woke me up because it was hurting.   PERTINENT HISTORY: See above   PAIN:  Are you having pain? Yes: NPRS scale: 6/10 Pain location: L leg Pain description: dull, weakness Aggravating factors: laying in bed, standing for too long, stairs Relieving factors: tylenol    PRECAUTIONS: Fall  RED FLAGS: None   WEIGHT BEARING RESTRICTIONS: No  FALLS:  Has patient fallen in last 6 months? Yes. Number of falls 1, just lost my balance and my knee buckled   LIVING ENVIRONMENT: Lives with: lives with their spouse Lives in: House/apartment Stairs: Yes: Internal: 12 steps; can reach both Has following equipment at home: Otho Blitz - 2 wheeled and Tour manager  OCCUPATION: N/A  PLOF: Independent with basic ADLs and Independent with household mobility with device  PATIENT GOALS: to get back on my feet and get back on my cane   NEXT MD VISIT: middle of June  OBJECTIVE:  Note: Objective measures were completed at Evaluation unless otherwise noted.  DIAGNOSTIC FINDINGS: X-rays are updated and  independent reviewed and shows status post total knee arthroplasty hardware well aligned. She does have what appears to be a valgus deformity of the left knee however the x-rays look okay.   COGNITION: Overall cognitive status: Within functional limits for tasks assessed     SENSATION: WFL   MUSCLE LENGTH: Hamstrings: tight in BLE, more tight in LLE  POSTURE: rounded shoulders, forward head, increased thoracic kyphosis, and flexed trunk   PALPATION: Tender in L glute and down the side of leg   LOWER EXTREMITY ROM:  Active ROM Right eval Left eval  Hip flexion    Hip extension    Hip abduction    Hip adduction    Hip internal rotation    Hip external rotation    Knee flexion  120  Knee extension  -15  Ankle dorsiflexion    Ankle plantarflexion    Ankle inversion    Ankle eversion     (Blank rows = not tested)  LOWER EXTREMITY MMT:  MMT Right eval Left eval   Hip flexion 5 4-  Hip extension    Hip abduction    Hip adduction    Hip internal rotation    Hip external rotation    Knee flexion 5 4-  Knee extension 5 4-  Ankle dorsiflexion    Ankle plantarflexion    Ankle inversion    Ankle eversion     (Blank rows = not tested)  LOWER EXTREMITY SPECIAL TESTS:  Positive slump test  LUMBAR ROM: is very limited especially with extension, she reports she is unable to do flexion per her doctor's orders   FUNCTIONAL TESTS:  5 times sit to stand: 29s needing UE to push off  Timed up and go (TUG): 22s   GAIT: Distance walked: in clinic distances Assistive device utilized: Environmental consultant - 2 wheeled Level of assistance: Complete Independence Comments: using RW, decreased knee ext on L knee                                                                                                                                TREATMENT DATE:  08/31/23 NuStep L5 x10mins  Rows and ext green band 2x10 STS 2x10  Horizontal abd red band 2x10 Passive LE stretches  Bridges 2x10 SLR 2x10  08/24/23 NuStep L5x91mins  Hip abduction green 2x10 STS 2x10 2# marching 20 reps alt x2  2# hip abd and ext with RW 2x10 Passive supine LE stretches HS, LTR, SKTC, glutes, figure 4   Supine feet on pball rotations and knees to chest x10   08/21/23 NuStep L 5 x 6 min Rows green 2x10 Standing Shoulder Ext green 2x10  Hip abd green 2x10  Hip add ball squeeze 2x10  Side steps at mat HHA x2 then x1 Standing hip Abd w/ RW 2lb 2x10  LAQ 2lb 2x15 HS curls green 2x10   08/16/23- EVAL    PATIENT EDUCATION:  Education details: POC, anatomy  education  Person educated: Patient Education method: Explanation Education comprehension: verbalized understanding  HOME EXERCISE PROGRAM: TBD  ASSESSMENT:  CLINICAL IMPRESSION: Patient is a 89 y.o. female who was seen today for physical therapy treatment for LBP and L leg weakness. Skilled therapy session focused on LE strength  and flexibility. Pt reports stretching feels good. However, her pain is still consistent and she feels that therapy so far has not been helping. She will benefit from PT to address her gait and balance abnormalities as well as her lumbar radiculopathy to increase her functional independence.   OBJECTIVE IMPAIRMENTS: Abnormal gait, decreased activity tolerance, decreased balance, difficulty walking, decreased ROM, decreased strength, impaired flexibility, improper body mechanics, postural dysfunction, and pain.   ACTIVITY LIMITATIONS: carrying, lifting, bending, squatting, stairs, and locomotion level  PARTICIPATION LIMITATIONS: shopping and yard work  PERSONAL FACTORS: Age, Past/current experiences, and Time since onset of injury/illness/exacerbation are also affecting patient's functional outcome.   REHAB POTENTIAL: Good  CLINICAL DECISION MAKING: Stable/uncomplicated  EVALUATION COMPLEXITY: Low   GOALS: Goals reviewed with patient? Yes  SHORT TERM GOALS: Target date: 09/27/23  Patient will be independent with initial HEP.  Baseline:  Goal status: INITIAL  2.  Patient will complete TUG with rolling walker <15s  Baseline: 22s Goal status: INITIAL   LONG TERM GOALS: Target date: 11/08/23  Patient will be independent with advanced/ongoing HEP to improve outcomes and carryover.  Baseline:  Goal status: INITIAL  2.  Patient will report 50-75% improvement in low back pain to improve QOL. (<2/10) Baseline: 4/10 Goal status: INITIAL  3.  Patient will report centralization of radicular symptoms.  Baseline: radicular sx in LLE Goal status: INITIAL  4.  Patient will demonstrate improved functional strength as demonstrated by 5xSTS <15s without UE use. Baseline: 29s using chair to push up Goal status: INITIAL  5.  Patient will complete TUG <20s with SPC Baseline: 22s with walker  Goal status: INITIAL   6.  Patient will be able to walk at least 362ft with [facility]  Baseline: not  using cane unless short distances in home Goal status: INITIAL  PLAN:  PT FREQUENCY: 2x/week  PT DURATION: 12 weeks  PLANNED INTERVENTIONS: 97110-Therapeutic exercises, 97530- Therapeutic activity, 97112- Neuromuscular re-education, 97535- Self Care, [id number]- Manual therapy, [phone]- Gait training, [phone]- Electrical stimulation (unattended), [phone]- Traction (mechanical), Patient/Family education, Balance training, Stair training, Taping, Dry Needling, Joint mobilization, Spinal mobilization, Cryotherapy, and Moist heat  PLAN FOR NEXT SESSION: stretching for low back and LE's, balance training, strengthening LLE, HEP initiation    Donavon Fudge, PT 08/31/2023, 3:45 PM

## 2023-09-04 ENCOUNTER — Ambulatory Visit

## 2023-09-06 ENCOUNTER — Encounter: Payer: Self-pay | Admitting: Physical Therapy

## 2023-09-06 ENCOUNTER — Ambulatory Visit: Attending: Orthopedic Surgery | Admitting: Physical Therapy

## 2023-09-06 DIAGNOSIS — R262 Difficulty in walking, not elsewhere classified: Secondary | ICD-10-CM

## 2023-09-06 DIAGNOSIS — M25662 Stiffness of left knee, not elsewhere classified: Secondary | ICD-10-CM

## 2023-09-06 DIAGNOSIS — M6281 Muscle weakness (generalized): Secondary | ICD-10-CM

## 2023-09-06 DIAGNOSIS — M5459 Other low back pain: Secondary | ICD-10-CM | POA: Diagnosis present

## 2023-09-06 DIAGNOSIS — R2681 Unsteadiness on feet: Secondary | ICD-10-CM | POA: Diagnosis present

## 2023-09-06 DIAGNOSIS — R2689 Other abnormalities of gait and mobility: Secondary | ICD-10-CM

## 2023-09-06 DIAGNOSIS — M62830 Muscle spasm of back: Secondary | ICD-10-CM

## 2023-09-06 DIAGNOSIS — M5417 Radiculopathy, lumbosacral region: Secondary | ICD-10-CM | POA: Diagnosis present

## 2023-09-06 DIAGNOSIS — M25562 Pain in left knee: Secondary | ICD-10-CM

## 2023-09-06 NOTE — Therapy (Signed)
 OUTPATIENT PHYSICAL THERAPY LOWER EXTREMITY TREATMENT   Patient Name: [name] MRN: [id number] DOB:10/08/1932, 89 y.o., female Today's Date: 09/06/2023  END OF SESSION:  PT End of Session - 09/06/23 1735     Visit Number 5    Date for PT Re-Evaluation 11/08/23    Authorization Type [employer]    PT Start Time 1732    PT Stop Time 1821    PT Time Calculation (min) 49 min    Activity Tolerance Patient tolerated treatment well    Behavior During Therapy [facility] for tasks assessed/performed               Past Medical History:  Diagnosis Date   Acalculous cholecystitis 08/24/2018   Arthritis    DVT of lower extremity (deep venous thrombosis) (HCC)    right leg - after back surgery in 2019   GERD (gastroesophageal reflux disease)    History of blood transfusion    Hypothyroidism    PONV (postoperative nausea and vomiting)    Ptosis of both eyelids    Vertigo    Wears glasses    reading   Past Surgical History:  Procedure Laterality Date   ABDOMINAL HYSTERECTOMY  1986   APPLICATION OF ROBOTIC ASSISTANCE FOR SPINAL PROCEDURE N/A 07/17/2017   Procedure: APPLICATION OF ROBOTIC ASSISTANCE FOR SPINAL PROCEDURE;  Surgeon: Elna Haggis, MD;  Location: [facility] OR;  Service: Neurosurgery;  Laterality: N/A;   APPLICATION OF ROBOTIC ASSISTANCE FOR SPINAL PROCEDURE N/A 09/01/2017   Procedure: APPLICATION OF ROBOTIC ASSISTANCE FOR SPINAL PROCEDURE;  Surgeon: Elna Haggis, MD;  Location: [facility] OR;  Service: Neurosurgery;  Laterality: N/A;   APPLICATION OF ROBOTIC ASSISTANCE FOR SPINAL PROCEDURE N/A 10/27/2020   Procedure: APPLICATION OF ROBOTIC ASSISTANCE FOR SPINAL PROCEDURE;  Surgeon: Elna Haggis, MD;  Location: [facility] OR;  Service: Neurosurgery;  Laterality: N/A;   BACK SURGERY  1986   lumb lam   BREAST BIOPSY Right 2011   CARPAL TUNNEL RELEASE  2000   right   CARPAL TUNNEL RELEASE  2012   left   CHOLECYSTECTOMY N/A 08/24/2018   Procedure: LAPAROSCOPIC CHOLECYSTECTOMY WITH  INTRAOPERATIVE CHOLANGIOGRAM;  Surgeon: Boyce Byes, MD;  Location: [facility] OR;  Service: General;  Laterality: N/A;   COLONOSCOPY     CYSTOCELE REPAIR  2007   sling   DISTAL INTERPHALANGEAL JOINT FUSION Right 02/04/2014   Procedure: FUSION DISTAL INTERPHALANGEAL JOINT RIGHT INDEX FINGER ;  Surgeon: Lyanne Sample, MD;  Location: [facility];  Service: Orthopedics;  Laterality: Right;   EYE SURGERY Bilateral    Cataract surgery with lens implant   KNEE SURGERY  2009,2011   partial knee   LAPAROSCOPY N/A 08/24/2018   Procedure: LAPAROSCOPY DIAGNOSTIC;  Surgeon: Boyce Byes, MD;  Location[facility];  Service: General;  Laterality: N/A;   LUMBAR PERCUTANEOUS PEDICLE SCREW 4 LEVEL N/A 10/27/2020   Procedure: Thoracic nine-ten Laminectomy with extension of fusion from Thoracic ten to Thoracic six with segmental fixation (cemented) with pedicle screw and mazor;  Surgeon: Elna Haggis, MD;  Location: [facility] OR;  Service: Neurosurgery;  Laterality: N/A;   OPEN REDUCTION INTERNAL FIXATION (ORIF) PROXIMAL PHALANX Left 08/30/2013   Procedure: OPEN REDUCTION INTERNAL FIXATION (ORIF) PROXIMAL PHALANX FRACTURE LEFT SMALL FINGER; SPLINT RING FINGER;  Surgeon: Kemp Patter, MD;  Location: [facility];  Service: Orthopedics;  Laterality: Left;   POSTERIOR LUMBAR FUSION 4 LEVEL N/A 07/17/2017   Procedure: Thoracic Ten - Lumbar Five revison of hardware with Mazor;  Surgeon: Elna Haggis,  MD;  Location: [facility] OR;  Service: Neurosurgery;  Laterality: N/A;  Thoracic/Lumbar   PTOSIS REPAIR Bilateral 07/27/2015   Procedure: PTOSIS REPAIR;  Surgeon: Phyllis Breeze, MD;  Location: [facility];  Service: Plastics;  Laterality: Bilateral;   SHOULDER SURGERY     rt rcr,and lt   TENOLYSIS Left 05/21/2021   Procedure: RELEASE EXTENSOR POLLICIS LONGUS LEFT;  Surgeon: Lyanne Sample, MD;  Location: [facility];  Service: Orthopedics;  Laterality: Left;   TENOLYSIS Left 06/08/2021    Procedure: RELEASE EXTENSOR POLLICIS LONGUS LEFT;  Surgeon: Lyanne Sample, MD;  Location: [facility];  Service: Orthopedics;  Laterality: Left;   TONSILLECTOMY     TOTAL KNEE ARTHROPLASTY Right 12/23/2019   Procedure: TOTAL KNEE ARTHROPLASTY;  Surgeon: Christie Cox, MD;  Location: [facility];  Service: Orthopedics;  Laterality: Right;   TRIGGER FINGER RELEASE Left 05/21/2021   Procedure: RELEASE TRIGGER FINGER/A-1 PULLEY LEFT MIDDLE FINGER;  Surgeon: Lyanne Sample, MD;  Location: [facility];  Service: Orthopedics;  Laterality: Left;   TRIGGER FINGER RELEASE Left 06/08/2021   Procedure: RELEASE A-1 PULLEY LEFT MIDDLE FINGER;  Surgeon: Lyanne Sample, MD;  Location: [facility];  Service: Orthopedics;  Laterality: Left;   Patient Active Problem List   Diagnosis Date Noted   Myelopathy concurrent with and due to spinal stenosis of thoracic region (HCC) 10/27/2020   S/P total knee arthroplasty, right 12/23/2019   Acalculous cholecystitis 08/24/2018   Osteoarthritis of right knee    Vertigo    DDD (degenerative disc disease), cervical    Benign essential HTN    History of DVT (deep vein thrombosis)    Tachycardia    Steroid-induced hyperglycemia    Hypothyroidism    Neuropathic pain    Acute blood loss anemia    S/P lumbar fusion    Closed L5 vertebral fracture (HCC) 08/31/2017   Closed fracture of fifth lumbar vertebra (HCC) 08/30/2017   Lumbar vertebral fracture (HCC) 07/17/2017   Scoliosis 06/13/2017    PCP: Dudley Ghee   REFERRING PROVIDER: Christie Cox  REFERRING DIAG:  [phone] (ICD-10-CM) - Left knee pain      THERAPY DIAG:  Other low back pain  Muscle weakness (generalized)  Difficulty in walking, not elsewhere classified  Unsteady gait  Radiculopathy, lumbosacral region  Other abnormalities of gait and mobility  Stiffness of left knee, not elsewhere classified  Acute pain of left knee  Muscle spasm of back  Rationale  for Evaluation and Treatment: Rehabilitation  ONSET DATE: 10/17/22  SUBJECTIVE:   SUBJECTIVE STATEMENT: Patient reports that she has a pinched nerve in the left leg, sees Dr. Ellery Guthrie 6/25  PERTINENT HISTORY: See above   PAIN:  Are you having pain? Yes: NPRS scale: 4/10 Pain location: L leg Pain description: dull, weakness Aggravating factors: laying in bed, standing for too long, stairs Relieving factors: tylenol    PRECAUTIONS: Fall  RED FLAGS: None   WEIGHT BEARING RESTRICTIONS: No  FALLS:  Has patient fallen in last 6 months? Yes. Number of falls 1, just lost my balance and my knee buckled   LIVING ENVIRONMENT: Lives with: lives with their spouse Lives in: House/apartment Stairs: Yes: Internal: 12 steps; can reach both Has following equipment at home: Otho Blitz - 2 wheeled and Tour manager  OCCUPATION: N/A  PLOF: Independent with basic ADLs and Independent with household mobility with device  PATIENT GOALS: to get back on my feet and get back on my cane   NEXT  MD VISIT: middle of June  OBJECTIVE:  Note: Objective measures were completed at Evaluation unless otherwise noted.  DIAGNOSTIC FINDINGS: X-rays are updated and independent reviewed and shows status post total knee arthroplasty hardware well aligned. She does have what appears to be a valgus deformity of the left knee however the x-rays look okay.   COGNITION: Overall cognitive status: Within functional limits for tasks assessed     SENSATION: WFL   MUSCLE LENGTH: Hamstrings: tight in BLE, more tight in LLE  POSTURE: rounded shoulders, forward head, increased thoracic kyphosis, and flexed trunk   PALPATION: Tender in L glute and down the side of leg   LOWER EXTREMITY ROM:  Active ROM Right eval Left eval  Hip flexion    Hip extension    Hip abduction    Hip adduction    Hip internal rotation    Hip external rotation    Knee flexion  120  Knee extension  -15  Ankle dorsiflexion    Ankle  plantarflexion    Ankle inversion    Ankle eversion     (Blank rows = not tested)  LOWER EXTREMITY MMT:  MMT Right eval Left eval  Hip flexion 5 4-  Hip extension    Hip abduction    Hip adduction    Hip internal rotation    Hip external rotation    Knee flexion 5 4-  Knee extension 5 4-  Ankle dorsiflexion    Ankle plantarflexion    Ankle inversion    Ankle eversion     (Blank rows = not tested)  LOWER EXTREMITY SPECIAL TESTS:  Positive slump test  LUMBAR ROM: is very limited especially with extension, she reports she is unable to do flexion per her doctor's orders   FUNCTIONAL TESTS:  5 times sit to stand: 29s needing UE to push off  Timed up and go (TUG): 22s   GAIT: Distance walked: in clinic distances Assistive device utilized: Environmental consultant - 2 wheeled Level of assistance: Complete Independence Comments: using RW, decreased knee ext on L knee                                                                                                                                TREATMENT DATE:  09/06/23 Nustep level 5 x 6 minutes LEg curls 20# 2x10 Leg ext 5# 2x10 5# straight arm pulls Green tband rows Green tband [employer] STM with the t-gun to the left buttock, ITB and HS area   08/31/23 NuStep L5 x43mins  Rows and ext green band 2x10 STS 2x10  Horizontal abd red band 2x10 Passive LE stretches  Bridges 2x10 SLR 2x10  08/24/23 NuStep L5x85mins  Hip abduction green 2x10 STS 2x10 2# marching 20 reps alt x2  2# hip abd and ext with RW 2x10 Passive supine LE stretches HS, LTR, SKTC, glutes, figure 4   Supine feet on pball rotations and knees to chest x10  08/21/23 NuStep L 5 x 6 min Rows green 2x10 Standing Shoulder Ext green 2x10  Hip abd green 2x10  Hip add ball squeeze 2x10  Side steps at mat HHA x2 then x1 Standing hip Abd w/ RW 2lb 2x10  LAQ 2lb 2x15 HS curls green 2x10   08/16/23- EVAL    PATIENT EDUCATION:  Education details: POC,  anatomy education  Person educated: Patient Education method: Explanation Education comprehension: verbalized understanding  HOME EXERCISE PROGRAM: TBD  ASSESSMENT:  CLINICAL IMPRESSION: Patient is a 89 y.o. female who was seen today for physical therapy treatment for LBP and L leg weakness. Skilled therapy session focused on LE strength and flexibility. She is very tender in the left glute, HS and ITB, I initiated some STM with the T-gun, she was very tender but reported that it felt better at the conclusion of the treatment session. She will benefit from PT to address her gait and balance abnormalities as well as her lumbar radiculopathy to increase her functional independence.   OBJECTIVE IMPAIRMENTS: Abnormal gait, decreased activity tolerance, decreased balance, difficulty walking, decreased ROM, decreased strength, impaired flexibility, improper body mechanics, postural dysfunction, and pain.   ACTIVITY LIMITATIONS: carrying, lifting, bending, squatting, stairs, and locomotion level  PARTICIPATION LIMITATIONS: shopping and yard work  PERSONAL FACTORS: Age, Past/current experiences, and Time since onset of injury/illness/exacerbation are also affecting patient's functional outcome.   REHAB POTENTIAL: Good  CLINICAL DECISION MAKING: Stable/uncomplicated  EVALUATION COMPLEXITY: Low   GOALS: Goals reviewed with patient? Yes  SHORT TERM GOALS: Target date: 09/27/23  Patient will be independent with initial HEP.  Baseline:  Goal status:progressing 09/06/23  2.  Patient will complete TUG with rolling walker <15s  Baseline: 22s Goal status: INITIAL   LONG TERM GOALS: Target date: 11/08/23  Patient will be independent with advanced/ongoing HEP to improve outcomes and carryover.  Baseline:  Goal status: INITIAL  2.  Patient will report 50-75% improvement in low back pain to improve QOL. (<2/10) Baseline: 4/10 Goal status: INITIAL  3.  Patient will report centralization of  radicular symptoms.  Baseline: radicular sx in LLE Goal status: INITIAL  4.  Patient will demonstrate improved functional strength as demonstrated by 5xSTS <15s without UE use. Baseline: 29s using chair to push up Goal status: INITIAL  5.  Patient will complete TUG <20s with SPC Baseline: 22s with walker  Goal status: INITIAL   6.  Patient will be able to walk at least 358ft with [facility]  Baseline: not using cane unless short distances in home Goal status: INITIAL  PLAN:  PT FREQUENCY: 2x/week  PT DURATION: 12 weeks  PLANNED INTERVENTIONS: 97110-Therapeutic exercises, 97530- Therapeutic activity, 97112- Neuromuscular re-education, 97535- Self Care, 13086- Manual therapy, [phone]- Gait training, [phone]- Electrical stimulation (unattended), [phone]- Traction (mechanical), Patient/Family education, Balance training, Stair training, Taping, Dry Needling, Joint mobilization, Spinal mobilization, Cryotherapy, and Moist heat  PLAN FOR NEXT SESSION: stretching for low back and LE's, balance training, strengthening LLE, HEP initiation    Ariza Evans W, PT 09/06/2023, 5:36 PM

## 2023-09-11 ENCOUNTER — Encounter: Payer: Self-pay | Admitting: Physical Therapy

## 2023-09-11 ENCOUNTER — Ambulatory Visit: Admitting: Physical Therapy

## 2023-09-11 DIAGNOSIS — R2681 Unsteadiness on feet: Secondary | ICD-10-CM

## 2023-09-11 DIAGNOSIS — M5459 Other low back pain: Secondary | ICD-10-CM

## 2023-09-11 DIAGNOSIS — R262 Difficulty in walking, not elsewhere classified: Secondary | ICD-10-CM

## 2023-09-11 DIAGNOSIS — M5417 Radiculopathy, lumbosacral region: Secondary | ICD-10-CM

## 2023-09-11 DIAGNOSIS — M6281 Muscle weakness (generalized): Secondary | ICD-10-CM

## 2023-09-11 NOTE — Therapy (Signed)
 OUTPATIENT PHYSICAL THERAPY LOWER EXTREMITY TREATMENT   Patient Name: [name] MRN: [id number] DOB:1932-04-26, 89 y.o., female Today's Date: 09/11/2023  END OF SESSION:  PT End of Session - 09/11/23 1514     Visit Number 6    Date for PT Re-Evaluation 11/08/23    Activity Tolerance Patient tolerated treatment well    Behavior During Therapy [facility] for tasks assessed/performed               Past Medical History:  Diagnosis Date   Acalculous cholecystitis 08/24/2018   Arthritis    DVT of lower extremity (deep venous thrombosis) (HCC)    right leg - after back surgery in 2019   GERD (gastroesophageal reflux disease)    History of blood transfusion    Hypothyroidism    PONV (postoperative nausea and vomiting)    Ptosis of both eyelids    Vertigo    Wears glasses    reading   Past Surgical History:  Procedure Laterality Date   ABDOMINAL HYSTERECTOMY  1986   APPLICATION OF ROBOTIC ASSISTANCE FOR SPINAL PROCEDURE N/A 07/17/2017   Procedure: APPLICATION OF ROBOTIC ASSISTANCE FOR SPINAL PROCEDURE;  Surgeon: Elna Haggis, MD;  Location: [facility] OR;  Service: Neurosurgery;  Laterality: N/A;   APPLICATION OF ROBOTIC ASSISTANCE FOR SPINAL PROCEDURE N/A 09/01/2017   Procedure: APPLICATION OF ROBOTIC ASSISTANCE FOR SPINAL PROCEDURE;  Surgeon: Elna Haggis, MD;  Location: [facility] OR;  Service: Neurosurgery;  Laterality: N/A;   APPLICATION OF ROBOTIC ASSISTANCE FOR SPINAL PROCEDURE N/A 10/27/2020   Procedure: APPLICATION OF ROBOTIC ASSISTANCE FOR SPINAL PROCEDURE;  Surgeon: Elna Haggis, MD;  Location: [facility] OR;  Service: Neurosurgery;  Laterality: N/A;   BACK SURGERY  1986   lumb lam   BREAST BIOPSY Right 2011   CARPAL TUNNEL RELEASE  2000   right   CARPAL TUNNEL RELEASE  2012   left   CHOLECYSTECTOMY N/A 08/24/2018   Procedure: LAPAROSCOPIC CHOLECYSTECTOMY WITH INTRAOPERATIVE CHOLANGIOGRAM;  Surgeon: Boyce Byes, MD;  Location: [facility] OR;  Service: General;  Laterality: N/A;    COLONOSCOPY     CYSTOCELE REPAIR  2007   sling   DISTAL INTERPHALANGEAL JOINT FUSION Right 02/04/2014   Procedure: FUSION DISTAL INTERPHALANGEAL JOINT RIGHT INDEX FINGER ;  Surgeon: Lyanne Sample, MD;  Location: [facility];  Service: Orthopedics;  Laterality: Right;   EYE SURGERY Bilateral    Cataract surgery with lens implant   KNEE SURGERY  2009,2011   partial knee   LAPAROSCOPY N/A 08/24/2018   Procedure: LAPAROSCOPY DIAGNOSTIC;  Surgeon: Boyce Byes, MD;  Location: [facility] OR;  Service: General;  Laterality: N/A;   LUMBAR PERCUTANEOUS PEDICLE SCREW 4 LEVEL N/A 10/27/2020   Procedure: Thoracic nine-ten Laminectomy with extension of fusion from Thoracic ten to Thoracic six with segmental fixation (cemented) with pedicle screw and mazor;  Surgeon: Elna Haggis, MD;  Location: [facility] OR;  Service: Neurosurgery;  Laterality: N/A;   OPEN REDUCTION INTERNAL FIXATION (ORIF) PROXIMAL PHALANX Left 08/30/2013   Procedure: OPEN REDUCTION INTERNAL FIXATION (ORIF) PROXIMAL PHALANX FRACTURE LEFT SMALL FINGER; SPLINT RING FINGER;  Surgeon: Kemp Patter, MD;  Location: [facility];  Service: Orthopedics;  Laterality: Left;   POSTERIOR LUMBAR FUSION 4 LEVEL N/A 07/17/2017   Procedure: Thoracic Ten - Lumbar Five revison of hardware with Mazor;  Surgeon: Elna Haggis, MD;  Location: [facility] OR;  Service: Neurosurgery;  Laterality: N/A;  Thoracic/Lumbar   PTOSIS REPAIR Bilateral 07/27/2015   Procedure: PTOSIS REPAIR;  Surgeon: Phyllis Breeze, MD;  Location: [facility];  Service: Plastics;  Laterality: Bilateral;   SHOULDER SURGERY     rt rcr,and lt   TENOLYSIS Left 05/21/2021   Procedure: RELEASE EXTENSOR POLLICIS LONGUS LEFT;  Surgeon: Lyanne Sample, MD;  Location: [facility];  Service: Orthopedics;  Laterality: Left;   TENOLYSIS Left 06/08/2021   Procedure: RELEASE EXTENSOR POLLICIS LONGUS LEFT;  Surgeon: Lyanne Sample, MD;  Location: [facility];   Service: Orthopedics;  Laterality: Left;   TONSILLECTOMY     TOTAL KNEE ARTHROPLASTY Right 12/23/2019   Procedure: TOTAL KNEE ARTHROPLASTY;  Surgeon: Christie Cox, MD;  Location: [facility];  Service: Orthopedics;  Laterality: Right;   TRIGGER FINGER RELEASE Left 05/21/2021   Procedure: RELEASE TRIGGER FINGER/A-1 PULLEY LEFT MIDDLE FINGER;  Surgeon: Lyanne Sample, MD;  Location: [facility];  Service: Orthopedics;  Laterality: Left;   TRIGGER FINGER RELEASE Left 06/08/2021   Procedure: RELEASE A-1 PULLEY LEFT MIDDLE FINGER;  Surgeon: Lyanne Sample, MD;  Location: [facility];  Service: Orthopedics;  Laterality: Left;   Patient Active Problem List   Diagnosis Date Noted   Myelopathy concurrent with and due to spinal stenosis of thoracic region (HCC) 10/27/2020   S/P total knee arthroplasty, right 12/23/2019   Acalculous cholecystitis 08/24/2018   Osteoarthritis of right knee    Vertigo    DDD (degenerative disc disease), cervical    Benign essential HTN    History of DVT (deep vein thrombosis)    Tachycardia    Steroid-induced hyperglycemia    Hypothyroidism    Neuropathic pain    Acute blood loss anemia    S/P lumbar fusion    Closed L5 vertebral fracture (HCC) 08/31/2017   Closed fracture of fifth lumbar vertebra (HCC) 08/30/2017   Lumbar vertebral fracture (HCC) 07/17/2017   Scoliosis 06/13/2017    PCP: Dudley Ghee   REFERRING PROVIDER: Christie Cox  REFERRING DIAG:  [phone] (ICD-10-CM) - Left knee pain      THERAPY DIAG:  Other low back pain  Muscle weakness (generalized)  Difficulty in walking, not elsewhere classified  Unsteady gait  Radiculopathy, lumbosacral region  Rationale for Evaluation and Treatment: Rehabilitation  ONSET DATE: 10/17/22  SUBJECTIVE:   SUBJECTIVE STATEMENT:  "Blah today"   Patient reports that she has a pinched nerve in the left leg, sees Dr. Ellery Guthrie 6/25  PERTINENT HISTORY: See above   PAIN:  Are you  having pain? Yes: NPRS scale: 0/10 Pain location: L leg Pain description: dull, weakness Aggravating factors: laying in bed, standing for too long, stairs Relieving factors: tylenol    PRECAUTIONS: Fall  RED FLAGS: None   WEIGHT BEARING RESTRICTIONS: No  FALLS:  Has patient fallen in last 6 months? Yes. Number of falls 1, just lost my balance and my knee buckled   LIVING ENVIRONMENT: Lives with: lives with their spouse Lives in: House/apartment Stairs: Yes: Internal: 12 steps; can reach both Has following equipment at home: Otho Blitz - 2 wheeled and Tour manager  OCCUPATION: N/A  PLOF: Independent with basic ADLs and Independent with household mobility with device  PATIENT GOALS: to get back on my feet and get back on my cane   NEXT MD VISIT: middle of June  OBJECTIVE:  Note: Objective measures were completed at Evaluation unless otherwise noted.  DIAGNOSTIC FINDINGS: X-rays are updated and independent reviewed and shows status post total knee arthroplasty hardware well aligned. She does have what appears to be a valgus deformity of the left knee  however the x-rays look okay.   COGNITION: Overall cognitive status: Within functional limits for tasks assessed     SENSATION: WFL   MUSCLE LENGTH: Hamstrings: tight in BLE, more tight in LLE  POSTURE: rounded shoulders, forward head, increased thoracic kyphosis, and flexed trunk   PALPATION: Tender in L glute and down the side of leg   LOWER EXTREMITY ROM:  Active ROM Right eval Left eval  Hip flexion    Hip extension    Hip abduction    Hip adduction    Hip internal rotation    Hip external rotation    Knee flexion  120  Knee extension  -15  Ankle dorsiflexion    Ankle plantarflexion    Ankle inversion    Ankle eversion     (Blank rows = not tested)  LOWER EXTREMITY MMT:  MMT Right eval Left eval  Hip flexion 5 4-  Hip extension    Hip abduction    Hip adduction    Hip internal rotation    Hip  external rotation    Knee flexion 5 4-  Knee extension 5 4-  Ankle dorsiflexion    Ankle plantarflexion    Ankle inversion    Ankle eversion     (Blank rows = not tested)  LOWER EXTREMITY SPECIAL TESTS:  Positive slump test  LUMBAR ROM: is very limited especially with extension, she reports she is unable to do flexion per her doctor's orders   FUNCTIONAL TESTS:  5 times sit to stand: 29s needing UE to push off  Timed up and go (TUG): 22s   GAIT: Distance walked: in clinic distances Assistive device utilized: Environmental consultant - 2 wheeled Level of assistance: Complete Independence Comments: using RW, decreased knee ext on L knee                                                                                                                                TREATMENT DATE:  09/11/23 Nustep level 5 x 6 minutes LEg curls 20# 2x10 Leg ext 5# 2x10 5# straight arm pulls Rows 10lb 2x10 Hip add ball squeeze 2x15 Hip abd green 2x15 Standing march  Side steps at mat table  Standing march, hip abd  in RW 2lb cuff 2x10  09/06/23 Nustep level 5 x 6 minutes LEg curls 20# 2x10 Leg ext 5# 2x10 5# straight arm pulls Green tband rows Green tband [employer] STM with the t-gun to the left buttock, ITB and HS area   08/31/23 NuStep L5 x96mins  Rows and ext green band 2x10 STS 2x10  Horizontal abd red band 2x10 Passive LE stretches  Bridges 2x10 SLR 2x10  08/24/23 NuStep L5x74mins  Hip abduction green 2x10 STS 2x10 2# marching 20 reps alt x2  2# hip abd and ext with RW 2x10 Passive supine LE stretches HS, LTR, SKTC, glutes, figure 4   Supine feet on pball rotations and knees to chest x10  08/21/23 NuStep L 5 x 6 min Rows green 2x10 Standing Shoulder Ext green 2x10  Hip abd green 2x10  Hip add ball squeeze 2x10  Side steps at mat HHA x2 then x1 Standing hip Abd w/ RW 2lb 2x10  LAQ 2lb 2x15 HS curls green 2x10   08/16/23- EVAL    PATIENT EDUCATION:  Education details: POC,  anatomy education  Person educated: Patient Education method: Explanation Education comprehension: verbalized understanding  HOME EXERCISE PROGRAM: TBD  ASSESSMENT:  CLINICAL IMPRESSION: Patient is a 89 y.o. female who was seen today for physical therapy treatment for LBP and L leg weakness. Skilled therapy session focused on LE strength and flexibility. Added some functional interventions without UE support, pt able to complete but needs some encouragement. R weakness exposed when marching and side stepping without AD. Cues to hold contraction with standing hip Ext. She will benefit from PT to address her gait and balance abnormalities as well as her lumbar radiculopathy to increase her functional independence.   OBJECTIVE IMPAIRMENTS: Abnormal gait, decreased activity tolerance, decreased balance, difficulty walking, decreased ROM, decreased strength, impaired flexibility, improper body mechanics, postural dysfunction, and pain.   ACTIVITY LIMITATIONS: carrying, lifting, bending, squatting, stairs, and locomotion level  PARTICIPATION LIMITATIONS: shopping and yard work  PERSONAL FACTORS: Age, Past/current experiences, and Time since onset of injury/illness/exacerbation are also affecting patient's functional outcome.   REHAB POTENTIAL: Good  CLINICAL DECISION MAKING: Stable/uncomplicated  EVALUATION COMPLEXITY: Low   GOALS: Goals reviewed with patient? Yes  SHORT TERM GOALS: Target date: 09/27/23  Patient will be independent with initial HEP.  Baseline:  Goal status:progressing 09/06/23  2.  Patient will complete TUG with rolling walker <15s  Baseline: 22s Goal status: INITIAL   LONG TERM GOALS: Target date: 11/08/23  Patient will be independent with advanced/ongoing HEP to improve outcomes and carryover.  Baseline:  Goal status: INITIAL  2.  Patient will report 50-75% improvement in low back pain to improve QOL. (<2/10) Baseline: 4/10 Goal status: INITIAL  3.   Patient will report centralization of radicular symptoms.  Baseline: radicular sx in LLE Goal status: INITIAL  4.  Patient will demonstrate improved functional strength as demonstrated by 5xSTS <15s without UE use. Baseline: 29s using chair to push up Goal status: INITIAL  5.  Patient will complete TUG <20s with SPC Baseline: 22s with walker  Goal status: INITIAL   6.  Patient will be able to walk at least 361ft with [facility]  Baseline: not using cane unless short distances in home Goal status: INITIAL  PLAN:  PT FREQUENCY: 2x/week  PT DURATION: 12 weeks  PLANNED INTERVENTIONS: 97110-Therapeutic exercises, 97530- Therapeutic activity, 97112- Neuromuscular re-education, 97535- Self Care, [id number]- Manual therapy, [phone]- Gait training, [phone]- Electrical stimulation (unattended), [phone]- Traction (mechanical), Patient/Family education, Balance training, Stair training, Taping, Dry Needling, Joint mobilization, Spinal mobilization, Cryotherapy, and Moist heat  PLAN FOR NEXT SESSION: stretching for low back and LE's, balance training, strengthening LLE, HEP initiation    Ollen Beverage, PTA 09/11/2023, 3:14 PM

## 2023-09-14 ENCOUNTER — Ambulatory Visit: Admitting: Physical Therapy

## 2023-09-14 ENCOUNTER — Encounter: Payer: Self-pay | Admitting: Physical Therapy

## 2023-09-14 DIAGNOSIS — R262 Difficulty in walking, not elsewhere classified: Secondary | ICD-10-CM

## 2023-09-14 DIAGNOSIS — M5459 Other low back pain: Secondary | ICD-10-CM

## 2023-09-14 DIAGNOSIS — M6281 Muscle weakness (generalized): Secondary | ICD-10-CM

## 2023-09-14 DIAGNOSIS — R2681 Unsteadiness on feet: Secondary | ICD-10-CM

## 2023-09-14 NOTE — Therapy (Signed)
 OUTPATIENT PHYSICAL THERAPY LOWER EXTREMITY TREATMENT   Patient Name: [name] MRN: [id number] DOB:[birth year], 89 y.o., female Today's Date: 09/14/2023  END OF SESSION:  PT End of Session - 09/14/23 1559     Visit Number 7    Date for PT Re-Evaluation 11/08/23    PT Start Time 1600    PT Stop Time 1645    PT Time Calculation (min) 45 min    Activity Tolerance Patient tolerated treatment well    Behavior During Therapy [facility] for tasks assessed/performed            Past Medical History:  Diagnosis Date   Acalculous cholecystitis 08/24/2018   Arthritis    DVT of lower extremity (deep venous thrombosis) (HCC)    right leg - after back surgery in 2019   GERD (gastroesophageal reflux disease)    History of blood transfusion    Hypothyroidism    PONV (postoperative nausea and vomiting)    Ptosis of both eyelids    Vertigo    Wears glasses    reading   Past Surgical History:  Procedure Laterality Date   ABDOMINAL HYSTERECTOMY  1986   APPLICATION OF ROBOTIC ASSISTANCE FOR SPINAL PROCEDURE N/A 07/17/2017   Procedure: APPLICATION OF ROBOTIC ASSISTANCE FOR SPINAL PROCEDURE;  Surgeon: Elna Haggis, MD;  Location: [facility] OR;  Service: Neurosurgery;  Laterality: N/A;   APPLICATION OF ROBOTIC ASSISTANCE FOR SPINAL PROCEDURE N/A 09/01/2017   Procedure: APPLICATION OF ROBOTIC ASSISTANCE FOR SPINAL PROCEDURE;  Surgeon: Elna Haggis, MD;  Location: [facility] OR;  Service: Neurosurgery;  Laterality: N/A;   APPLICATION OF ROBOTIC ASSISTANCE FOR SPINAL PROCEDURE N/A 10/27/2020   Procedure: APPLICATION OF ROBOTIC ASSISTANCE FOR SPINAL PROCEDURE;  Surgeon: Elna Haggis, MD;  Location: [facility] OR;  Service: Neurosurgery;  Laterality: N/A;   BACK SURGERY  1986   lumb lam   BREAST BIOPSY Right 2011   CARPAL TUNNEL RELEASE  2000   right   CARPAL TUNNEL RELEASE  2012   left   CHOLECYSTECTOMY N/A 08/24/2018   Procedure: LAPAROSCOPIC CHOLECYSTECTOMY WITH INTRAOPERATIVE CHOLANGIOGRAM;  Surgeon:  Boyce Byes, MD;  Location: [facility] OR;  Service: General;  Laterality: N/A;   COLONOSCOPY     CYSTOCELE REPAIR  2007   sling   DISTAL INTERPHALANGEAL JOINT FUSION Right 02/04/2014   Procedure: FUSION DISTAL INTERPHALANGEAL JOINT RIGHT INDEX FINGER ;  Surgeon: Lyanne Sample, MD;  Location: [facility];  Service: Orthopedics;  Laterality: Right;   EYE SURGERY Bilateral    Cataract surgery with lens implant   KNEE SURGERY  2009,2011   partial knee   LAPAROSCOPY N/A 08/24/2018   Procedure: LAPAROSCOPY DIAGNOSTIC;  Surgeon: Boyce Byes, MD;  Location: [facility] OR;  Service: General;  Laterality: N/A;   LUMBAR PERCUTANEOUS PEDICLE SCREW 4 LEVEL N/A 10/27/2020   Procedure: Thoracic nine-ten Laminectomy with extension of fusion from Thoracic ten to Thoracic six with segmental fixation (cemented) with pedicle screw and mazor;  Surgeon: Elna Haggis, MD;  Location: [facility] OR;  Service: Neurosurgery;  Laterality: N/A;   OPEN REDUCTION INTERNAL FIXATION (ORIF) PROXIMAL PHALANX Left 08/30/2013   Procedure: OPEN REDUCTION INTERNAL FIXATION (ORIF) PROXIMAL PHALANX FRACTURE LEFT SMALL FINGER; SPLINT RING FINGER;  Surgeon: Kemp Patter, MD;  Location: [facility];  Service: Orthopedics;  Laterality: Left;   POSTERIOR LUMBAR FUSION 4 LEVEL N/A 07/17/2017   Procedure: Thoracic Ten - Lumbar Five revison of hardware with Mazor;  Surgeon: Elna Haggis, MD;  Location: [facility] OR;  Service: Neurosurgery;  Laterality: N/A;  Thoracic/Lumbar   PTOSIS REPAIR Bilateral 07/27/2015   Procedure: PTOSIS REPAIR;  Surgeon: Phyllis Breeze, MD;  Location: [facility];  Service: Plastics;  Laterality: Bilateral;   SHOULDER SURGERY     rt rcr,and lt   TENOLYSIS Left 05/21/2021   Procedure: RELEASE EXTENSOR POLLICIS LONGUS LEFT;  Surgeon: Lyanne Sample, MD;  Location: [facility];  Service: Orthopedics;  Laterality: Left;   TENOLYSIS Left 06/08/2021   Procedure: RELEASE EXTENSOR POLLICIS  LONGUS LEFT;  Surgeon: Lyanne Sample, MD;  Location: [facility];  Service: Orthopedics;  Laterality: Left;   TONSILLECTOMY     TOTAL KNEE ARTHROPLASTY Right 12/23/2019   Procedure: TOTAL KNEE ARTHROPLASTY;  Surgeon: Christie Cox, MD;  Location: [facility];  Service: Orthopedics;  Laterality: Right;   TRIGGER FINGER RELEASE Left 05/21/2021   Procedure: RELEASE TRIGGER FINGER/A-1 PULLEY LEFT MIDDLE FINGER;  Surgeon: Lyanne Sample, MD;  Location: [facility];  Service: Orthopedics;  Laterality: Left;   TRIGGER FINGER RELEASE Left 06/08/2021   Procedure: RELEASE A-1 PULLEY LEFT MIDDLE FINGER;  Surgeon: Lyanne Sample, MD;  Location: [facility];  Service: Orthopedics;  Laterality: Left;   Patient Active Problem List   Diagnosis Date Noted   Myelopathy concurrent with and due to spinal stenosis of thoracic region (HCC) 10/27/2020   S/P total knee arthroplasty, right 12/23/2019   Acalculous cholecystitis 08/24/2018   Osteoarthritis of right knee    Vertigo    DDD (degenerative disc disease), cervical    Benign essential HTN    History of DVT (deep vein thrombosis)    Tachycardia    Steroid-induced hyperglycemia    Hypothyroidism    Neuropathic pain    Acute blood loss anemia    S/P lumbar fusion    Closed L5 vertebral fracture (HCC) 08/31/2017   Closed fracture of fifth lumbar vertebra (HCC) 08/30/2017   Lumbar vertebral fracture (HCC) 07/17/2017   Scoliosis 06/13/2017    PCP: Dudley Ghee   REFERRING PROVIDER: Christie Cox  REFERRING DIAG:  [id number] (ICD-10-CM) - Left knee pain      THERAPY DIAG:  Other low back pain  Muscle weakness (generalized)  Unsteady gait  Difficulty in walking, not elsewhere classified  Rationale for Evaluation and Treatment: Rehabilitation  ONSET DATE: 10/17/22  SUBJECTIVE:   SUBJECTIVE STATEMENT: I feel pretty good Tired, washing clothes walking up and down the steps    Patient reports that she has a  pinched nerve in the left leg, sees Dr. Ellery Guthrie 6/25  PERTINENT HISTORY: See above   PAIN:  Are you having pain? Yes: NPRS scale: 0/10 Pain location: L leg Pain description: dull, weakness Aggravating factors: laying in bed, standing for too long, stairs Relieving factors: tylenol    PRECAUTIONS: Fall  RED FLAGS: None   WEIGHT BEARING RESTRICTIONS: No  FALLS:  Has patient fallen in last 6 months? Yes. Number of falls 1, just lost my balance and my knee buckled   LIVING ENVIRONMENT: Lives with: lives with their spouse Lives in: House/apartment Stairs: Yes: Internal: 12 steps; can reach both Has following equipment at home: Otho Blitz - 2 wheeled and Tour manager  OCCUPATION: N/A  PLOF: Independent with basic ADLs and Independent with household mobility with device  PATIENT GOALS: to get back on my feet and get back on my cane   NEXT MD VISIT: middle of June  OBJECTIVE:  Note: Objective measures were completed at Evaluation unless otherwise noted.  DIAGNOSTIC FINDINGS: X-rays are  updated and independent reviewed and shows status post total knee arthroplasty hardware well aligned. She does have what appears to be a valgus deformity of the left knee however the x-rays look okay.   COGNITION: Overall cognitive status: Within functional limits for tasks assessed     SENSATION: WFL   MUSCLE LENGTH: Hamstrings: tight in BLE, more tight in LLE  POSTURE: rounded shoulders, forward head, increased thoracic kyphosis, and flexed trunk   PALPATION: Tender in L glute and down the side of leg   LOWER EXTREMITY ROM:  Active ROM Left eval Left 09/14/23  Hip flexion    Hip extension    Hip abduction    Hip adduction    Hip internal rotation    Hip external rotation    Knee flexion 120 118  Knee extension -15 7  Ankle dorsiflexion    Ankle plantarflexion    Ankle inversion    Ankle eversion     (Blank rows = not tested)  LOWER EXTREMITY MMT:  MMT Right eval  Left eval Left 09/14/23  Hip flexion 5 4- 4  Hip extension     Hip abduction     Hip adduction     Hip internal rotation     Hip external rotation     Knee flexion 5 4- 4  Knee extension 5 4- 4  Ankle dorsiflexion     Ankle plantarflexion     Ankle inversion     Ankle eversion      (Blank rows = not tested)  LOWER EXTREMITY SPECIAL TESTS:  Positive slump test  LUMBAR ROM: is very limited especially with extension, she reports she is unable to do flexion per her doctor's orders   FUNCTIONAL TESTS:  5 times sit to stand: 29s needing UE to push off  Timed up and go (TUG): 22s   GAIT: Distance walked: in clinic distances Assistive device utilized: Environmental consultant - 2 wheeled Level of assistance: Complete Independence Comments: using RW, decreased knee ext on L knee                                                                                                                                TREATMENT DATE:  09/14/23 Nustep level 5 x 6 minutes GOALS  ROM   MMT  5x sit to stand 17 sec TUG 16.40 sec HS curls 25lb 2x12 Leg Ext 10lb 2x12 Hip add ball squeeze 2x15 4in step ups with RW x10 Side steps at mat table 5# straight arm pulls  09/11/23 Nustep level 5 x 6 minutes LEg curls 20# 2x10 Leg ext 5# 2x10 5# straight arm pulls Rows 10lb 2x10 Hip add ball squeeze 2x15 Hip abd green 2x15 Standing march  Side steps at mat table  Standing march, hip abd  in RW 2lb cuff 2x10  09/06/23 Nustep level 5 x 6 minutes LEg curls 20# 2xNustep level 5 x 6 minutes10 Leg ext 5# 2x10 5# straight  arm pulls Green tband rows Green tband [employer] STM with the t-gun to the left buttock, ITB and HS area   08/31/23 NuStep L5 x71mins  Rows and ext green band 2x10 STS 2x10  Horizontal abd red band 2x10 Passive LE stretches  Bridges 2x10 SLR 2x10  08/24/23 NuStep L5x25mins  Hip abduction green 2x10 STS 2x10 2# marching 20 reps alt x2  2# hip abd and ext with RW 2x10 Passive  supine LE stretches HS, LTR, SKTC, glutes, figure 4   Supine feet on pball rotations and knees to chest x10   08/21/23 NuStep L 5 x 6 min Rows green 2x10 Standing Shoulder Ext green 2x10  Hip abd green 2x10  Hip add ball squeeze 2x10  Side steps at mat HHA x2 then x1 Standing hip Abd w/ RW 2lb 2x10  LAQ 2lb 2x15 HS curls green 2x10   08/16/23- EVAL    PATIENT EDUCATION:  Education details: POC, anatomy education  Person educated: Patient Education method: Explanation Education comprehension: verbalized understanding  HOME EXERCISE PROGRAM: TBD  ASSESSMENT:  CLINICAL IMPRESSION: Patient is a 89 y.o. female who was seen today for physical therapy treatment for LBP and L leg weakness. Skilled therapy session focused on LE strength and balance. Encouragement needed with side step without UE assist. Pt has progressed decreasing both 5 times sit to stand and TUG times. R weakness exposed with mobility without AD. Cues to hold contraction with leg ext. She will benefit from PT to address her gait and balance abnormalities as well as her lumbar radiculopathy to increase her functional independence.   OBJECTIVE IMPAIRMENTS: Abnormal gait, decreased activity tolerance, decreased balance, difficulty walking, decreased ROM, decreased strength, impaired flexibility, improper body mechanics, postural dysfunction, and pain.   ACTIVITY LIMITATIONS: carrying, lifting, bending, squatting, stairs, and locomotion level  PARTICIPATION LIMITATIONS: shopping and yard work  PERSONAL FACTORS: Age, Past/current experiences, and Time since onset of injury/illness/exacerbation are also affecting patient's functional outcome.   REHAB POTENTIAL: Good  CLINICAL DECISION MAKING: Stable/uncomplicated  EVALUATION COMPLEXITY: Low   GOALS: Goals reviewed with patient? Yes  SHORT TERM GOALS: Target date: 09/27/23  Patient will be independent with initial HEP.  Baseline:  Goal status:progressing  09/06/23  2.  Patient will complete TUG with rolling walker <15s  Baseline: 22s Goal status: Progressing 09/14/23   LONG TERM GOALS: Target date: 11/08/23  Patient will be independent with advanced/ongoing HEP to improve outcomes and carryover.  Baseline:  Goal status: INITIAL  2.  Patient will report 50-75% improvement in low back pain to improve QOL. (<2/10) Baseline: 4/10 Goal status: INITIAL  3.  Patient will report centralization of radicular symptoms.  Baseline: radicular sx in LLE Goal status: Ongoing 09/14/23  4.  Patient will demonstrate improved functional strength as demonstrated by 5xSTS <15s without UE use. Baseline: 29s using chair to push up Goal status: Progressing 09/14/23  5.  Patient will complete TUG <20s with SPC Baseline: 22s with walker  Goal status: INITIAL   6.  Patient will be able to walk at least 327ft with [facility]  Baseline: not using cane unless short distances in home Goal status: INITIAL  PLAN:  PT FREQUENCY: 2x/week  PT DURATION: 12 weeks  PLANNED INTERVENTIONS: 97110-Therapeutic exercises, 97530- Therapeutic activity, [id number]- Neuromuscular re-education, 97535- Self Care, [id number]- Manual therapy, [id number]- Gait training, [phone]- Electrical stimulation (unattended), [phone]- Traction (mechanical), Patient/Family education, Balance training, Stair training, Taping, Dry Needling, Joint mobilization, Spinal mobilization, Cryotherapy, and Moist heat  PLAN FOR NEXT  SESSION: stretching for low back and LE's, balance training, strengthening LLE, HEP initiation    Ollen Beverage, PTA 09/14/2023, 3:59 PM

## 2023-09-19 ENCOUNTER — Ambulatory Visit: Admitting: Physical Therapy

## 2023-09-21 ENCOUNTER — Ambulatory Visit

## 2023-09-21 DIAGNOSIS — M5459 Other low back pain: Secondary | ICD-10-CM | POA: Diagnosis not present

## 2023-09-21 DIAGNOSIS — M5417 Radiculopathy, lumbosacral region: Secondary | ICD-10-CM

## 2023-09-21 DIAGNOSIS — M6281 Muscle weakness (generalized): Secondary | ICD-10-CM

## 2023-09-21 NOTE — Therapy (Signed)
 OUTPATIENT PHYSICAL THERAPY LOWER EXTREMITY TREATMENT   Patient Name: [name] MRN: [id number] DOB:1932/04/24, 89 y.o., female Today's Date: 09/21/2023  END OF SESSION:  PT End of Session - 09/21/23 1540     Visit Number 8    Date for PT Re-Evaluation 11/08/23    PT Start Time 1540    PT Stop Time 1625    PT Time Calculation (min) 45 min    Activity Tolerance Patient tolerated treatment well    Behavior During Therapy [facility] for tasks assessed/performed             Past Medical History:  Diagnosis Date   Acalculous cholecystitis 08/24/2018   Arthritis    DVT of lower extremity (deep venous thrombosis) (HCC)    right leg - after back surgery in 2019   GERD (gastroesophageal reflux disease)    History of blood transfusion    Hypothyroidism    PONV (postoperative nausea and vomiting)    Ptosis of both eyelids    Vertigo    Wears glasses    reading   Past Surgical History:  Procedure Laterality Date   ABDOMINAL HYSTERECTOMY  1986   APPLICATION OF ROBOTIC ASSISTANCE FOR SPINAL PROCEDURE N/A 07/17/2017   Procedure: APPLICATION OF ROBOTIC ASSISTANCE FOR SPINAL PROCEDURE;  Surgeon: Elna Haggis, MD;  Location: [facility] OR;  Service: Neurosurgery;  Laterality: N/A;   APPLICATION OF ROBOTIC ASSISTANCE FOR SPINAL PROCEDURE N/A 09/01/2017   Procedure: APPLICATION OF ROBOTIC ASSISTANCE FOR SPINAL PROCEDURE;  Surgeon: Elna Haggis, MD;  Location: [facility] OR;  Service: Neurosurgery;  Laterality: N/A;   APPLICATION OF ROBOTIC ASSISTANCE FOR SPINAL PROCEDURE N/A 10/27/2020   Procedure: APPLICATION OF ROBOTIC ASSISTANCE FOR SPINAL PROCEDURE;  Surgeon: Elna Haggis, MD;  Location: [facility] OR;  Service: Neurosurgery;  Laterality: N/A;   BACK SURGERY  1986   lumb lam   BREAST BIOPSY Right 2011   CARPAL TUNNEL RELEASE  2000   right   CARPAL TUNNEL RELEASE  2012   left   CHOLECYSTECTOMY N/A 08/24/2018   Procedure: LAPAROSCOPIC CHOLECYSTECTOMY WITH INTRAOPERATIVE CHOLANGIOGRAM;  Surgeon:  Boyce Byes, MD;  Location: [facility] OR;  Service: General;  Laterality: N/A;   COLONOSCOPY     CYSTOCELE REPAIR  2007   sling   DISTAL INTERPHALANGEAL JOINT FUSION Right 02/04/2014   Procedure: FUSION DISTAL INTERPHALANGEAL JOINT RIGHT INDEX FINGER ;  Surgeon: Lyanne Sample, MD;  Location: [facility];  Service: Orthopedics;  Laterality: Right;   EYE SURGERY Bilateral    Cataract surgery with lens implant   KNEE SURGERY  2009,2011   partial knee   LAPAROSCOPY N/A 08/24/2018   Procedure: LAPAROSCOPY DIAGNOSTIC;  Surgeon: Boyce Byes, MD;  Location: [facility] OR;  Service: General;  Laterality: N/A;   LUMBAR PERCUTANEOUS PEDICLE SCREW 4 LEVEL N/A 10/27/2020   Procedure: Thoracic nine-ten Laminectomy with extension of fusion from Thoracic ten to Thoracic six with segmental fixation (cemented) with pedicle screw and mazor;  Surgeon: Elna Haggis, MD;  Location: [facility] OR;  Service: Neurosurgery;  Laterality: N/A;   OPEN REDUCTION INTERNAL FIXATION (ORIF) PROXIMAL PHALANX Left 08/30/2013   Procedure: OPEN REDUCTION INTERNAL FIXATION (ORIF) PROXIMAL PHALANX FRACTURE LEFT SMALL FINGER; SPLINT RING FINGER;  Surgeon: Kemp Patter, MD;  Location: [facility];  Service: Orthopedics;  Laterality: Left;   POSTERIOR LUMBAR FUSION 4 LEVEL N/A 07/17/2017   Procedure: Thoracic Ten - Lumbar Five revison of hardware with Mazor;  Surgeon: Elna Haggis, MD;  Location: [facility] OR;  Service: Neurosurgery;  Laterality: N/A;  Thoracic/Lumbar   PTOSIS REPAIR Bilateral 07/27/2015   Procedure: PTOSIS REPAIR;  Surgeon: Phyllis Breeze, MD;  Location: [facility];  Service: Plastics;  Laterality: Bilateral;   SHOULDER SURGERY     rt rcr,and lt   TENOLYSIS Left 05/21/2021   Procedure: RELEASE EXTENSOR POLLICIS LONGUS LEFT;  Surgeon: Lyanne Sample, MD;  Location: [facility];  Service: Orthopedics;  Laterality: Left;   TENOLYSIS Left 06/08/2021   Procedure: RELEASE EXTENSOR POLLICIS  LONGUS LEFT;  Surgeon: Lyanne Sample, MD;  Location: [facility];  Service: Orthopedics;  Laterality: Left;   TONSILLECTOMY     TOTAL KNEE ARTHROPLASTY Right 12/23/2019   Procedure: TOTAL KNEE ARTHROPLASTY;  Surgeon: Christie Cox, MD;  Location: [facility];  Service: Orthopedics;  Laterality: Right;   TRIGGER FINGER RELEASE Left 05/21/2021   Procedure: RELEASE TRIGGER FINGER/A-1 PULLEY LEFT MIDDLE FINGER;  Surgeon: Lyanne Sample, MD;  Location: [facility];  Service: Orthopedics;  Laterality: Left;   TRIGGER FINGER RELEASE Left 06/08/2021   Procedure: RELEASE A-1 PULLEY LEFT MIDDLE FINGER;  Surgeon: Lyanne Sample, MD;  Location: [facility];  Service: Orthopedics;  Laterality: Left;   Patient Active Problem List   Diagnosis Date Noted   Myelopathy concurrent with and due to spinal stenosis of thoracic region (HCC) 10/27/2020   S/P total knee arthroplasty, right 12/23/2019   Acalculous cholecystitis 08/24/2018   Osteoarthritis of right knee    Vertigo    DDD (degenerative disc disease), cervical    Benign essential HTN    History of DVT (deep vein thrombosis)    Tachycardia    Steroid-induced hyperglycemia    Hypothyroidism    Neuropathic pain    Acute blood loss anemia    S/P lumbar fusion    Closed L5 vertebral fracture (HCC) 08/31/2017   Closed fracture of fifth lumbar vertebra (HCC) 08/30/2017   Lumbar vertebral fracture (HCC) 07/17/2017   Scoliosis 06/13/2017    PCP: Dudley Ghee   REFERRING PROVIDER: Christie Cox  REFERRING DIAG:  [phone] (ICD-10-CM) - Left knee pain      THERAPY DIAG:  Other low back pain  Muscle weakness (generalized)  Radiculopathy, lumbosacral region  Rationale for Evaluation and Treatment: Rehabilitation  ONSET DATE: 10/17/22  SUBJECTIVE:   SUBJECTIVE STATEMENT: I feel pretty good. The left leg and the nerve makes my leg feel heavy     Patient reports that she has a pinched nerve in the left leg, sees  Dr. Ellery Guthrie 6/25  PERTINENT HISTORY: See above   PAIN:  Are you having pain? Yes: NPRS scale: 0/10 Pain location: L leg Pain description: dull, weakness Aggravating factors: laying in bed, standing for too long, stairs Relieving factors: tylenol    PRECAUTIONS: Fall  RED FLAGS: None   WEIGHT BEARING RESTRICTIONS: No  FALLS:  Has patient fallen in last 6 months? Yes. Number of falls 1, just lost my balance and my knee buckled   LIVING ENVIRONMENT: Lives with: lives with their spouse Lives in: House/apartment Stairs: Yes: Internal: 12 steps; can reach both Has following equipment at home: Otho Blitz - 2 wheeled and Tour manager  OCCUPATION: N/A  PLOF: Independent with basic ADLs and Independent with household mobility with device  PATIENT GOALS: to get back on my feet and get back on my cane   NEXT MD VISIT: middle of June  OBJECTIVE:  Note: Objective measures were completed at Evaluation unless otherwise noted.  DIAGNOSTIC FINDINGS: X-rays are updated and independent  reviewed and shows status post total knee arthroplasty hardware well aligned. She does have what appears to be a valgus deformity of the left knee however the x-rays look okay.   COGNITION: Overall cognitive status: Within functional limits for tasks assessed     SENSATION: WFL   MUSCLE LENGTH: Hamstrings: tight in BLE, more tight in LLE  POSTURE: rounded shoulders, forward head, increased thoracic kyphosis, and flexed trunk   PALPATION: Tender in L glute and down the side of leg   LOWER EXTREMITY ROM:  Active ROM Left eval Left 09/14/23  Hip flexion    Hip extension    Hip abduction    Hip adduction    Hip internal rotation    Hip external rotation    Knee flexion 120 118  Knee extension -15 7  Ankle dorsiflexion    Ankle plantarflexion    Ankle inversion    Ankle eversion     (Blank rows = not tested)  LOWER EXTREMITY MMT:  MMT Right eval Left eval Left 09/14/23  Hip flexion 5 4- 4   Hip extension     Hip abduction     Hip adduction     Hip internal rotation     Hip external rotation     Knee flexion 5 4- 4  Knee extension 5 4- 4  Ankle dorsiflexion     Ankle plantarflexion     Ankle inversion     Ankle eversion      (Blank rows = not tested)  LOWER EXTREMITY SPECIAL TESTS:  Positive slump test  LUMBAR ROM: is very limited especially with extension, she reports she is unable to do flexion per her doctor's orders   FUNCTIONAL TESTS:  5 times sit to stand: 29s needing UE to push off  Timed up and go (TUG): 22s   GAIT: Distance walked: in clinic distances Assistive device utilized: Environmental consultant - 2 wheeled Level of assistance: Complete Independence Comments: using RW, decreased knee ext on L knee                                                                                                                                TREATMENT DATE:  09/21/23 Nustep level 5 x 6 minutes Leg curls 20# 2x10 Leg ext 5# 2x10 10# straight arm pulls 2x10 Bridges 2x10 Green tband clamshells 2x10 STM with the t-gun to the left buttock, ITB and HS area LE stretching HS, SKTC, LTR   09/14/23 Nustep level 5 x 6 minutes GOALS  ROM    MMT  5x sit to stand 17 sec TUG 16.40 sec HS curls 25lb 2x12 Leg Ext 10lb 2x12 Hip add ball squeeze 2x15 4in step ups with RW x10 Side steps at mat table 5# straight arm pulls  09/11/23 Nustep level 5 x 6 minutes LEg curls 20# 2x10 Leg ext 5# 2x10 5# straight arm pulls Rows 10lb 2x10 Hip add ball squeeze 2x15 Hip abd green  2x15 Standing march  Side steps at mat table  Standing march, hip abd  in RW 2lb cuff 2x10  09/06/23 Nustep level 5 x 6 minutes LEg curls 20# 2xNustep level 5 x 6 minutes10 Leg ext 5# 2x10 5# straight arm pulls Green tband rows Green tband [employer] STM with the t-gun to the left buttock, ITB and HS area   08/31/23 NuStep L5 x54mins  Rows and ext green band 2x10 STS 2x10  Horizontal abd red band  2x10 Passive LE stretches  Bridges 2x10 SLR 2x10  08/24/23 NuStep L5x24mins  Hip abduction green 2x10 STS 2x10 2# marching 20 reps alt x2  2# hip abd and ext with RW 2x10 Passive supine LE stretches HS, LTR, SKTC, glutes, figure 4   Supine feet on pball rotations and knees to chest x10   08/21/23 NuStep L 5 x 6 min Rows green 2x10 Standing Shoulder Ext green 2x10  Hip abd green 2x10  Hip add ball squeeze 2x10  Side steps at mat HHA x2 then x1 Standing hip Abd w/ RW 2lb 2x10  LAQ 2lb 2x15 HS curls green 2x10   08/16/23- EVAL    PATIENT EDUCATION:  Education details: POC, anatomy education  Person educated: Patient Education method: Explanation Education comprehension: verbalized understanding  HOME EXERCISE PROGRAM: TBD  ASSESSMENT:  CLINICAL IMPRESSION: Patient is a 89 y.o. female who was seen today for physical therapy treatment for LBP and L leg weakness. Skilled therapy session focused on LE strength and stretching. She had weakness in the LLE. Also very tender in L glute and hip with STM, especially at greater trochanter. She thinks it could be bursitis. Patient reports the stretching feels good.    She will benefit from PT to address her gait and balance abnormalities as well as her lumbar radiculopathy to increase her functional independence.   OBJECTIVE IMPAIRMENTS: Abnormal gait, decreased activity tolerance, decreased balance, difficulty walking, decreased ROM, decreased strength, impaired flexibility, improper body mechanics, postural dysfunction, and pain.   ACTIVITY LIMITATIONS: carrying, lifting, bending, squatting, stairs, and locomotion level  PARTICIPATION LIMITATIONS: shopping and yard work  PERSONAL FACTORS: Age, Past/current experiences, and Time since onset of injury/illness/exacerbation are also affecting patient's functional outcome.   REHAB POTENTIAL: Good  CLINICAL DECISION MAKING: Stable/uncomplicated  EVALUATION COMPLEXITY:  Low   GOALS: Goals reviewed with patient? Yes  SHORT TERM GOALS: Target date: 09/27/23  Patient will be independent with initial HEP.  Baseline:  Goal status:progressing 09/06/23  2.  Patient will complete TUG with rolling walker <15s  Baseline: 22s Goal status: Progressing 09/14/23   LONG TERM GOALS: Target date: 11/08/23  Patient will be independent with advanced/ongoing HEP to improve outcomes and carryover.  Baseline:  Goal status: INITIAL  2.  Patient will report 50-75% improvement in low back pain to improve QOL. (<2/10) Baseline: 4/10 Goal status: INITIAL  3.  Patient will report centralization of radicular symptoms.  Baseline: radicular sx in LLE Goal status: Ongoing 09/14/23  4.  Patient will demonstrate improved functional strength as demonstrated by 5xSTS <15s without UE use. Baseline: 29s using chair to push up Goal status: Progressing 09/14/23  5.  Patient will complete TUG <20s with SPC Baseline: 22s with walker  Goal status: INITIAL   6.  Patient will be able to walk at least 367ft with [facility]  Baseline: not using cane unless short distances in home Goal status: INITIAL  PLAN:  PT FREQUENCY: 2x/week  PT DURATION: 12 weeks  PLANNED INTERVENTIONS: 97110-Therapeutic exercises,  29528- Therapeutic activity, [id number]- Neuromuscular re-education, [id number]- Self Care, [id number]- Manual therapy, [id number]- Gait training, [phone]- Electrical stimulation (unattended), [phone]- Traction (mechanical), Patient/Family education, Balance training, Stair training, Taping, Dry Needling, Joint mobilization, Spinal mobilization, Cryotherapy, and Moist heat  PLAN FOR NEXT SESSION: stretching for low back and LE's, balance training, strengthening LLE, HEP initiation    Donavon Fudge, PT 09/21/2023, 4:28 PM

## 2023-09-25 ENCOUNTER — Ambulatory Visit: Admitting: Physical Therapy

## 2023-09-25 ENCOUNTER — Encounter: Payer: Self-pay | Admitting: Physical Therapy

## 2023-09-25 DIAGNOSIS — R2681 Unsteadiness on feet: Secondary | ICD-10-CM

## 2023-09-25 DIAGNOSIS — M5459 Other low back pain: Secondary | ICD-10-CM

## 2023-09-25 DIAGNOSIS — M6281 Muscle weakness (generalized): Secondary | ICD-10-CM

## 2023-09-25 DIAGNOSIS — M5417 Radiculopathy, lumbosacral region: Secondary | ICD-10-CM

## 2023-09-25 DIAGNOSIS — R262 Difficulty in walking, not elsewhere classified: Secondary | ICD-10-CM

## 2023-09-25 NOTE — Therapy (Signed)
 OUTPATIENT PHYSICAL THERAPY LOWER EXTREMITY TREATMENT   Patient Name: [name] MRN: [id number] DOB:May 09, 1932, 89 y.o., female Today's Date: 09/25/2023  END OF SESSION:  PT End of Session - 09/25/23 1608     Visit Number 9    Date for PT Re-Evaluation 11/08/23    Authorization Type [employer]    PT Start Time 1612    PT Stop Time 1700    PT Time Calculation (min) 48 min    Activity Tolerance Patient tolerated treatment well    Behavior During Therapy [facility] for tasks assessed/performed             Past Medical History:  Diagnosis Date   Acalculous cholecystitis 08/24/2018   Arthritis    DVT of lower extremity (deep venous thrombosis) (HCC)    right leg - after back surgery in 2019   GERD (gastroesophageal reflux disease)    History of blood transfusion    Hypothyroidism    PONV (postoperative nausea and vomiting)    Ptosis of both eyelids    Vertigo    Wears glasses    reading   Past Surgical History:  Procedure Laterality Date   ABDOMINAL HYSTERECTOMY  1986   APPLICATION OF ROBOTIC ASSISTANCE FOR SPINAL PROCEDURE N/A 07/17/2017   Procedure: APPLICATION OF ROBOTIC ASSISTANCE FOR SPINAL PROCEDURE;  Surgeon: Colon Shove, MD;  Location: [facility] OR;  Service: Neurosurgery;  Laterality: N/A;   APPLICATION OF ROBOTIC ASSISTANCE FOR SPINAL PROCEDURE N/A 09/01/2017   Procedure: APPLICATION OF ROBOTIC ASSISTANCE FOR SPINAL PROCEDURE;  Surgeon: Colon Shove, MD;  Location: [facility] OR;  Service: Neurosurgery;  Laterality: N/A;   APPLICATION OF ROBOTIC ASSISTANCE FOR SPINAL PROCEDURE N/A 10/27/2020   Procedure: APPLICATION OF ROBOTIC ASSISTANCE FOR SPINAL PROCEDURE;  Surgeon: Colon Shove, MD;  Location: [facility] OR;  Service: Neurosurgery;  Laterality: N/A;   BACK SURGERY  1986   lumb lam   BREAST BIOPSY Right 2011   CARPAL TUNNEL RELEASE  2000   right   CARPAL TUNNEL RELEASE  2012   left   CHOLECYSTECTOMY N/A 08/24/2018   Procedure: LAPAROSCOPIC CHOLECYSTECTOMY WITH INTRAOPERATIVE  CHOLANGIOGRAM;  Surgeon: Gail Favorite, MD;  Location: [facility] OR;  Service: General;  Laterality: N/A;   COLONOSCOPY     CYSTOCELE REPAIR  2007   sling   DISTAL INTERPHALANGEAL JOINT FUSION Right 02/04/2014   Procedure: FUSION DISTAL INTERPHALANGEAL JOINT RIGHT INDEX FINGER ;  Surgeon: Arley Curia, MD;  Location: [facility];  Service: Orthopedics;  Laterality: Right;   EYE SURGERY Bilateral    Cataract surgery with lens implant   KNEE SURGERY  2009,2011   partial knee   LAPAROSCOPY N/A 08/24/2018   Procedure: LAPAROSCOPY DIAGNOSTIC;  Surgeon: Gail Favorite, MD;  Location: [facility] OR;  Service: General;  Laterality: N/A;   LUMBAR PERCUTANEOUS PEDICLE SCREW 4 LEVEL N/A 10/27/2020   Procedure: Thoracic nine-ten Laminectomy with extension of fusion from Thoracic ten to Thoracic six with segmental fixation (cemented) with pedicle screw and mazor;  Surgeon: Colon Shove, MD;  Location: [facility] OR;  Service: Neurosurgery;  Laterality: N/A;   OPEN REDUCTION INTERNAL FIXATION (ORIF) PROXIMAL PHALANX Left 08/30/2013   Procedure: OPEN REDUCTION INTERNAL FIXATION (ORIF) PROXIMAL PHALANX FRACTURE LEFT SMALL FINGER; SPLINT RING FINGER;  Surgeon: Arley JONELLE Curia, MD;  Location: [facility];  Service: Orthopedics;  Laterality: Left;   POSTERIOR LUMBAR FUSION 4 LEVEL N/A 07/17/2017   Procedure: Thoracic Ten - Lumbar Five revison of hardware with Mazor;  Surgeon: Colon Shove, MD;  Location: [facility] OR;  Service: Neurosurgery;  Laterality: N/A;  Thoracic/Lumbar   PTOSIS REPAIR Bilateral 07/27/2015   Procedure: PTOSIS REPAIR;  Surgeon: Elna Pick, MD;  Location: [facility];  Service: Plastics;  Laterality: Bilateral;   SHOULDER SURGERY     rt rcr,and lt   TENOLYSIS Left 05/21/2021   Procedure: RELEASE EXTENSOR POLLICIS LONGUS LEFT;  Surgeon: Murrell Kuba, MD;  Location: [facility];  Service: Orthopedics;  Laterality: Left;   TENOLYSIS Left 06/08/2021   Procedure:  RELEASE EXTENSOR POLLICIS LONGUS LEFT;  Surgeon: Murrell Kuba, MD;  Location: [facility];  Service: Orthopedics;  Laterality: Left;   TONSILLECTOMY     TOTAL KNEE ARTHROPLASTY Right 12/23/2019   Procedure: TOTAL KNEE ARTHROPLASTY;  Surgeon: Rubie Kemps, MD;  Location: [facility];  Service: Orthopedics;  Laterality: Right;   TRIGGER FINGER RELEASE Left 05/21/2021   Procedure: RELEASE TRIGGER FINGER/A-1 PULLEY LEFT MIDDLE FINGER;  Surgeon: Murrell Kuba, MD;  Location: [facility];  Service: Orthopedics;  Laterality: Left;   TRIGGER FINGER RELEASE Left 06/08/2021   Procedure: RELEASE A-1 PULLEY LEFT MIDDLE FINGER;  Surgeon: Murrell Kuba, MD;  Location: [facility];  Service: Orthopedics;  Laterality: Left;   Patient Active Problem List   Diagnosis Date Noted   Myelopathy concurrent with and due to spinal stenosis of thoracic region (HCC) 10/27/2020   S/P total knee arthroplasty, right 12/23/2019   Acalculous cholecystitis 08/24/2018   Osteoarthritis of right knee    Vertigo    DDD (degenerative disc disease), cervical    Benign essential HTN    History of DVT (deep vein thrombosis)    Tachycardia    Steroid-induced hyperglycemia    Hypothyroidism    Neuropathic pain    Acute blood loss anemia    S/P lumbar fusion    Closed L5 vertebral fracture (HCC) 08/31/2017   Closed fracture of fifth lumbar vertebra (HCC) 08/30/2017   Lumbar vertebral fracture (HCC) 07/17/2017   Scoliosis 06/13/2017    PCP: Tanda Rummer   REFERRING PROVIDER: Kemps Rubie  REFERRING DIAG:  [phone] (ICD-10-CM) - Left knee pain      THERAPY DIAG:  Other low back pain  Muscle weakness (generalized)  Unsteady gait  Radiculopathy, lumbosacral region  Difficulty in walking, not elsewhere classified  Rationale for Evaluation and Treatment: Rehabilitation  ONSET DATE: 10/17/22  SUBJECTIVE:   SUBJECTIVE STATEMENT: I just don't feel great, not sure this is  helping    Patient reports that she has a pinched nerve in the left leg, sees Dr. Colon 6/25  PERTINENT HISTORY: See above   PAIN:  Are you having pain? Yes: NPRS scale: 0/10 Pain location: L leg Pain description: dull, weakness Aggravating factors: laying in bed, standing for too long, stairs Relieving factors: tylenol    PRECAUTIONS: Fall  RED FLAGS: None   WEIGHT BEARING RESTRICTIONS: No  FALLS:  Has patient fallen in last 6 months? Yes. Number of falls 1, just lost my balance and my knee buckled   LIVING ENVIRONMENT: Lives with: lives with their spouse Lives in: House/apartment Stairs: Yes: Internal: 12 steps; can reach both Has following equipment at home: Vannie - 2 wheeled and Tour manager  OCCUPATION: N/A  PLOF: Independent with basic ADLs and Independent with household mobility with device  PATIENT GOALS: to get back on my feet and get back on my cane   NEXT MD VISIT: middle of June  OBJECTIVE:  Note: Objective measures were completed at Evaluation  unless otherwise noted.  DIAGNOSTIC FINDINGS: X-rays are updated and independent reviewed and shows status post total knee arthroplasty hardware well aligned. She does have what appears to be a valgus deformity of the left knee however the x-rays look okay.   COGNITION: Overall cognitive status: Within functional limits for tasks assessed     SENSATION: WFL   MUSCLE LENGTH: Hamstrings: tight in BLE, more tight in LLE  POSTURE: rounded shoulders, forward head, increased thoracic kyphosis, and flexed trunk   PALPATION: Tender in L glute and down the side of leg   LOWER EXTREMITY ROM:  Active ROM Left eval Left 09/14/23  Hip flexion    Hip extension    Hip abduction    Hip adduction    Hip internal rotation    Hip external rotation    Knee flexion 120 118  Knee extension -15 7  Ankle dorsiflexion    Ankle plantarflexion    Ankle inversion    Ankle eversion     (Blank rows = not  tested)  LOWER EXTREMITY MMT:  MMT Right eval Left eval Left 09/14/23  Hip flexion 5 4- 4  Hip extension     Hip abduction     Hip adduction     Hip internal rotation     Hip external rotation     Knee flexion 5 4- 4  Knee extension 5 4- 4  Ankle dorsiflexion     Ankle plantarflexion     Ankle inversion     Ankle eversion      (Blank rows = not tested)  LOWER EXTREMITY SPECIAL TESTS:  Positive slump test  LUMBAR ROM: is very limited especially with extension, she reports she is unable to do flexion per her doctor's orders   FUNCTIONAL TESTS:  5 times sit to stand: 29s needing UE to push off  Timed up and go (TUG): 22s   GAIT: Distance walked: in clinic distances Assistive device utilized: Environmental consultant - 2 wheeled Level of assistance: Complete Independence Comments: using RW, decreased knee ext on L knee                                                                                                                                TREATMENT DATE:  09/25/23 Passive stretch STM to the left low back, buttock Nustep level 5 x 6 minutes Leg curls 20# 2x10 5# leg extension 10# straight arm pulls Green tband clamshells Ball b/n knees squeeze Side stepping Small weighted ball STS with reach 3# marching 3# hip abduction 3# hip extension  09/21/23 Nustep level 5 x 6 minutes Leg curls 20# 2x10 Leg ext 5# 2x10 10# straight arm pulls 2x10 Bridges 2x10 Green tband clamshells 2x10 STM with the t-gun to the left buttock, ITB and HS area LE stretching HS, SKTC, LTR   09/14/23 Nustep level 5 x 6 minutes GOALS  ROM    MMT  5x sit to stand 17 sec  TUG 16.40 sec HS curls 25lb 2x12 Leg Ext 10lb 2x12 Hip add ball squeeze 2x15 4in step ups with RW x10 Side steps at mat table 5# straight arm pulls  09/11/23 Nustep level 5 x 6 minutes LEg curls 20# 2x10 Leg ext 5# 2x10 5# straight arm pulls Rows 10lb 2x10 Hip add ball squeeze 2x15 Hip abd green 2x15 Standing march  Side  steps at mat table  Standing march, hip abd  in RW 2lb cuff 2x10  09/06/23 Nustep level 5 x 6 minutes LEg curls 20# 2xNustep level 5 x 6 minutes10 Leg ext 5# 2x10 5# straight arm pulls Green tband rows Green tband [employer] STM with the t-gun to the left buttock, ITB and HS area   08/31/23 NuStep L5 x36mins  Rows and ext green band 2x10 STS 2x10  Horizontal abd red band 2x10 Passive LE stretches  Bridges 2x10 SLR 2x10  08/24/23 NuStep L5x21mins  Hip abduction green 2x10 STS 2x10 2# marching 20 reps alt x2  2# hip abd and ext with RW 2x10 Passive supine LE stretches HS, LTR, SKTC, glutes, figure 4   Supine feet on pball rotations and knees to chest x10   08/21/23 NuStep L 5 x 6 min Rows green 2x10 Standing Shoulder Ext green 2x10  Hip abd green 2x10  Hip add ball squeeze 2x10  Side steps at mat HHA x2 then x1 Standing hip Abd w/ RW 2lb 2x10  LAQ 2lb 2x15 HS curls green 2x10   08/16/23- EVAL    PATIENT EDUCATION:  Education details: POC, anatomy education  Person educated: Patient Education method: Explanation Education comprehension: verbalized understanding  HOME EXERCISE PROGRAM: TBD  ASSESSMENT:  CLINICAL IMPRESSION: Patient is a 89 y.o. female who was seen today for physical therapy treatment for LBP and L leg weakness. Skilled therapy session focused on LE strength and stretching. She had weakness in the LLE. Also very tender in L glute and hip with STM, She will be seeing the surgeon on Wednesday and we will see what we need to do from there   She will benefit from PT to address her gait and balance abnormalities as well as her lumbar radiculopathy to increase her functional independence.   OBJECTIVE IMPAIRMENTS: Abnormal gait, decreased activity tolerance, decreased balance, difficulty walking, decreased ROM, decreased strength, impaired flexibility, improper body mechanics, postural dysfunction, and pain.   ACTIVITY LIMITATIONS: carrying,  lifting, bending, squatting, stairs, and locomotion level  PARTICIPATION LIMITATIONS: shopping and yard work  PERSONAL FACTORS: Age, Past/current experiences, and Time since onset of injury/illness/exacerbation are also affecting patient's functional outcome.   REHAB POTENTIAL: Good  CLINICAL DECISION MAKING: Stable/uncomplicated  EVALUATION COMPLEXITY: Low   GOALS: Goals reviewed with patient? Yes  SHORT TERM GOALS: Target date: 09/27/23  Patient will be independent with initial HEP.  Baseline:  Goal status:progressing 09/06/23  2.  Patient will complete TUG with rolling walker <15s  Baseline: 22s Goal status: Progressing 09/14/23   LONG TERM GOALS: Target date: 11/08/23  Patient will be independent with advanced/ongoing HEP to improve outcomes and carryover.  Baseline:  Goal status: INITIAL  2.  Patient will report 50-75% improvement in low back pain to improve QOL. (<2/10) Baseline: 4/10 Goal status: INITIAL  3.  Patient will report centralization of radicular symptoms.  Baseline: radicular sx in LLE Goal status: Ongoing 09/14/23  4.  Patient will demonstrate improved functional strength as demonstrated by 5xSTS <15s without UE use. Baseline: 29s using chair to push up Goal status:  Progressing 09/14/23  5.  Patient will complete TUG <20s with SPC Baseline: 22s with walker  Goal status: progressing 09/25/23  6.  Patient will be able to walk at least 331ft with [facility]  Baseline: not using cane unless short distances in home Goal status: ongoing 09/25/23  PLAN:  PT FREQUENCY: 2x/week  PT DURATION: 12 weeks  PLANNED INTERVENTIONS: 97110-Therapeutic exercises, 97530- Therapeutic activity, 97112- Neuromuscular re-education, 97535- Self Care, [id number]- Manual therapy, [phone]- Gait training, [phone]- Electrical stimulation (unattended), [phone]- Traction (mechanical), Patient/Family education, Balance training, Stair training, Taping, Dry Needling, Joint mobilization, Spinal  mobilization, Cryotherapy, and Moist heat  PLAN FOR NEXT SESSION: stretching for low back and LE's, balance training, strengthening LLE, HEP initiation , See what the MD says   OBADIAH OZELL ORN, PT 09/25/2023, 4:26 PM

## 2023-09-28 ENCOUNTER — Ambulatory Visit

## 2023-09-28 DIAGNOSIS — M6281 Muscle weakness (generalized): Secondary | ICD-10-CM

## 2023-09-28 DIAGNOSIS — M5417 Radiculopathy, lumbosacral region: Secondary | ICD-10-CM

## 2023-09-28 DIAGNOSIS — M5459 Other low back pain: Secondary | ICD-10-CM | POA: Diagnosis not present

## 2023-09-28 DIAGNOSIS — R262 Difficulty in walking, not elsewhere classified: Secondary | ICD-10-CM

## 2023-09-28 DIAGNOSIS — R2689 Other abnormalities of gait and mobility: Secondary | ICD-10-CM

## 2023-09-28 NOTE — Therapy (Signed)
 OUTPATIENT PHYSICAL THERAPY LOWER EXTREMITY TREATMENT  Progress Note Reporting Period 08/16/23 to 09/28/23  See note below for Objective Data and Assessment of Progress/Goals.     Patient Name: [name] MRN: [id number] DOB:Mar 26, 1933, 89 y.o., female Today's Date: 09/28/2023  END OF SESSION:  PT End of Session - 09/28/23 1537     Visit Number 10    Date for PT Re-Evaluation 11/08/23    Authorization Type [employer]    PT Start Time 1537    PT Stop Time 1620    PT Time Calculation (min) 43 min    Activity Tolerance Patient tolerated treatment well    Behavior During Therapy [facility] for tasks assessed/performed              Past Medical History:  Diagnosis Date   Acalculous cholecystitis 08/24/2018   Arthritis    DVT of lower extremity (deep venous thrombosis) (HCC)    right leg - after back surgery in 2019   GERD (gastroesophageal reflux disease)    History of blood transfusion    Hypothyroidism    PONV (postoperative nausea and vomiting)    Ptosis of both eyelids    Vertigo    Wears glasses    reading   Past Surgical History:  Procedure Laterality Date   ABDOMINAL HYSTERECTOMY  1986   APPLICATION OF ROBOTIC ASSISTANCE FOR SPINAL PROCEDURE N/A 07/17/2017   Procedure: APPLICATION OF ROBOTIC ASSISTANCE FOR SPINAL PROCEDURE;  Surgeon: Colon Shove, MD;  Location: [facility] OR;  Service: Neurosurgery;  Laterality: N/A;   APPLICATION OF ROBOTIC ASSISTANCE FOR SPINAL PROCEDURE N/A 09/01/2017   Procedure: APPLICATION OF ROBOTIC ASSISTANCE FOR SPINAL PROCEDURE;  Surgeon: Colon Shove, MD;  Location: [facility] OR;  Service: Neurosurgery;  Laterality: N/A;   APPLICATION OF ROBOTIC ASSISTANCE FOR SPINAL PROCEDURE N/A 10/27/2020   Procedure: APPLICATION OF ROBOTIC ASSISTANCE FOR SPINAL PROCEDURE;  Surgeon: Colon Shove, MD;  Location: [facility] OR;  Service: Neurosurgery;  Laterality: N/A;   BACK SURGERY  1986   lumb lam   BREAST BIOPSY Right 2011   CARPAL TUNNEL RELEASE  2000   right    CARPAL TUNNEL RELEASE  2012   left   CHOLECYSTECTOMY N/A 08/24/2018   Procedure: LAPAROSCOPIC CHOLECYSTECTOMY WITH INTRAOPERATIVE CHOLANGIOGRAM;  Surgeon: Gail Favorite, MD;  Location: [facility] OR;  Service: General;  Laterality: N/A;   COLONOSCOPY     CYSTOCELE REPAIR  2007   sling   DISTAL INTERPHALANGEAL JOINT FUSION Right 02/04/2014   Procedure: FUSION DISTAL INTERPHALANGEAL JOINT RIGHT INDEX FINGER ;  Surgeon: Arley Curia, MD;  Location: [facility];  Service: Orthopedics;  Laterality: Right;   EYE SURGERY Bilateral    Cataract surgery with lens implant   KNEE SURGERY  2009,2011   partial knee   LAPAROSCOPY N/A 08/24/2018   Procedure: LAPAROSCOPY DIAGNOSTIC;  Surgeon: Gail Favorite, MD;  Location: [facility] OR;  Service: General;  Laterality: N/A;   LUMBAR PERCUTANEOUS PEDICLE SCREW 4 LEVEL N/A 10/27/2020   Procedure: Thoracic nine-ten Laminectomy with extension of fusion from Thoracic ten to Thoracic six with segmental fixation (cemented) with pedicle screw and mazor;  Surgeon: Colon Shove, MD;  Location: [facility] OR;  Service: Neurosurgery;  Laterality: N/A;   OPEN REDUCTION INTERNAL FIXATION (ORIF) PROXIMAL PHALANX Left 08/30/2013   Procedure: OPEN REDUCTION INTERNAL FIXATION (ORIF) PROXIMAL PHALANX FRACTURE LEFT SMALL FINGER; SPLINT RING FINGER;  Surgeon: Arley JONELLE Curia, MD;  Location: [facility];  Service: Orthopedics;  Laterality: Left;   POSTERIOR LUMBAR FUSION 4  LEVEL N/A 07/17/2017   Procedure: Thoracic Ten - Lumbar Five revison of hardware with Mazor;  Surgeon: Colon Shove, MD;  Location: [facility] OR;  Service: Neurosurgery;  Laterality: N/A;  Thoracic/Lumbar   PTOSIS REPAIR Bilateral 07/27/2015   Procedure: PTOSIS REPAIR;  Surgeon: Elna Pick, MD;  Location: [facility];  Service: Plastics;  Laterality: Bilateral;   SHOULDER SURGERY     rt rcr,and lt   TENOLYSIS Left 05/21/2021   Procedure: RELEASE EXTENSOR POLLICIS LONGUS LEFT;  Surgeon: Murrell Kuba, MD;  Location: [facility];  Service: Orthopedics;  Laterality: Left;   TENOLYSIS Left 06/08/2021   Procedure: RELEASE EXTENSOR POLLICIS LONGUS LEFT;  Surgeon: Murrell Kuba, MD;  Location: [facility];  Service: Orthopedics;  Laterality: Left;   TONSILLECTOMY     TOTAL KNEE ARTHROPLASTY Right 12/23/2019   Procedure: TOTAL KNEE ARTHROPLASTY;  Surgeon: Rubie Kemps, MD;  Location: [facility];  Service: Orthopedics;  Laterality: Right;   TRIGGER FINGER RELEASE Left 05/21/2021   Procedure: RELEASE TRIGGER FINGER/A-1 PULLEY LEFT MIDDLE FINGER;  Surgeon: Murrell Kuba, MD;  Location: [facility];  Service: Orthopedics;  Laterality: Left;   TRIGGER FINGER RELEASE Left 06/08/2021   Procedure: RELEASE A-1 PULLEY LEFT MIDDLE FINGER;  Surgeon: Murrell Kuba, MD;  Location: [facility];  Service: Orthopedics;  Laterality: Left;   Patient Active Problem List   Diagnosis Date Noted   Myelopathy concurrent with and due to spinal stenosis of thoracic region (HCC) 10/27/2020   S/P total knee arthroplasty, right 12/23/2019   Acalculous cholecystitis 08/24/2018   Osteoarthritis of right knee    Vertigo    DDD (degenerative disc disease), cervical    Benign essential HTN    History of DVT (deep vein thrombosis)    Tachycardia    Steroid-induced hyperglycemia    Hypothyroidism    Neuropathic pain    Acute blood loss anemia    S/P lumbar fusion    Closed L5 vertebral fracture (HCC) 08/31/2017   Closed fracture of fifth lumbar vertebra (HCC) 08/30/2017   Lumbar vertebral fracture (HCC) 07/17/2017   Scoliosis 06/13/2017    PCP: Tanda Rummer   REFERRING PROVIDER: Kemps Rubie  REFERRING DIAG:  [phone] (ICD-10-CM) - Left knee pain      THERAPY DIAG:  Radiculopathy, lumbosacral region  Muscle weakness (generalized)  Difficulty in walking, not elsewhere classified  Other abnormalities of gait and mobility  Rationale for Evaluation and  Treatment: Rehabilitation  ONSET DATE: 10/17/22  SUBJECTIVE:   SUBJECTIVE STATEMENT: Dr didn't tell me anything except keep doing PT. They want to do a CT scan.     Patient reports that she has a pinched nerve in the left leg, sees Dr. Colon 6/25  PERTINENT HISTORY: See above   PAIN:  Are you having pain? Yes: NPRS scale: 0/10 Pain location: L leg Pain description: dull, weakness Aggravating factors: laying in bed, standing for too long, stairs Relieving factors: tylenol    PRECAUTIONS: Fall  RED FLAGS: None   WEIGHT BEARING RESTRICTIONS: No  FALLS:  Has patient fallen in last 6 months? Yes. Number of falls 1, just lost my balance and my knee buckled   LIVING ENVIRONMENT: Lives with: lives with their spouse Lives in: House/apartment Stairs: Yes: Internal: 12 steps; can reach both Has following equipment at home: Vannie - 2 wheeled and Shower bench  OCCUPATION: N/A  PLOF: Independent with basic ADLs and Independent with household mobility with device  PATIENT GOALS: to get  back on my feet and get back on my cane   NEXT MD VISIT: middle of June  OBJECTIVE:  Note: Objective measures were completed at Evaluation unless otherwise noted.  DIAGNOSTIC FINDINGS: X-rays are updated and independent reviewed and shows status post total knee arthroplasty hardware well aligned. She does have what appears to be a valgus deformity of the left knee however the x-rays look okay.   COGNITION: Overall cognitive status: Within functional limits for tasks assessed     SENSATION: WFL   MUSCLE LENGTH: Hamstrings: tight in BLE, more tight in LLE  POSTURE: rounded shoulders, forward head, increased thoracic kyphosis, and flexed trunk   PALPATION: Tender in L glute and down the side of leg   LOWER EXTREMITY ROM:  Active ROM Left eval Left 09/14/23  Hip flexion    Hip extension    Hip abduction    Hip adduction    Hip internal rotation    Hip external rotation    Knee  flexion 120 118  Knee extension -15 7  Ankle dorsiflexion    Ankle plantarflexion    Ankle inversion    Ankle eversion     (Blank rows = not tested)  LOWER EXTREMITY MMT:  MMT Right eval Left eval Left 09/14/23  Hip flexion 5 4- 4  Hip extension     Hip abduction     Hip adduction     Hip internal rotation     Hip external rotation     Knee flexion 5 4- 4  Knee extension 5 4- 4  Ankle dorsiflexion     Ankle plantarflexion     Ankle inversion     Ankle eversion      (Blank rows = not tested)  LOWER EXTREMITY SPECIAL TESTS:  Positive slump test  LUMBAR ROM: is very limited especially with extension, she reports she is unable to do flexion per her doctor's orders   FUNCTIONAL TESTS:  5 times sit to stand: 29s needing UE to push off  Timed up and go (TUG): 22s   GAIT: Distance walked: in clinic distances Assistive device utilized: Environmental consultant - 2 wheeled Level of assistance: Complete Independence Comments: using RW, decreased knee ext on L knee                                                                                                                                TREATMENT DATE:  09/28/23 TUG- 14.74s with walker 5xSTS- 16s NuStep L5x34mins  Leg ext 5# 2x12 HS curls 25# 2x12  Passive LE stretching- HS, SKTC, ITB, glutes Feet on pball rotations, knees to chest x10 Bridges with ball squeeze 2x5 Ball squeeze 2x10 STS 2x10  09/25/23 Passive stretch STM to the left low back, buttock Nustep level 5 x 6 minutes Leg curls 20# 2x10 5# leg extension 10# straight arm pulls Green tband clamshells Ball b/n knees squeeze Side stepping Small weighted ball STS with reach 3# marching  3# hip abduction 3# hip extension  09/21/23 Nustep level 5 x 6 minutes Leg curls 20# 2x10 Leg ext 5# 2x10 10# straight arm pulls 2x10 Bridges 2x10 Green tband clamshells 2x10 STM with the t-gun to the left buttock, ITB and HS area LE stretching HS, SKTC, LTR   09/14/23 Nustep  level 5 x 6 minutes GOALS  ROM    MMT  5x sit to stand 17 sec TUG 16.40 sec HS curls 25lb 2x12 Leg Ext 10lb 2x12 Hip add ball squeeze 2x15 4in step ups with RW x10 Side steps at mat table 5# straight arm pulls  09/11/23 Nustep level 5 x 6 minutes LEg curls 20# 2x10 Leg ext 5# 2x10 5# straight arm pulls Rows 10lb 2x10 Hip add ball squeeze 2x15 Hip abd green 2x15 Standing march  Side steps at mat table  Standing march, hip abd  in RW 2lb cuff 2x10  09/06/23 Nustep level 5 x 6 minutes LEg curls 20# 2xNustep level 5 x 6 minutes10 Leg ext 5# 2x10 5# straight arm pulls Green tband rows Green tband [employer] STM with the t-gun to the left buttock, ITB and HS area   08/31/23 NuStep L5 x72mins  Rows and ext green band 2x10 STS 2x10  Horizontal abd red band 2x10 Passive LE stretches  Bridges 2x10 SLR 2x10  08/24/23 NuStep L5x61mins  Hip abduction green 2x10 STS 2x10 2# marching 20 reps alt x2  2# hip abd and ext with RW 2x10 Passive supine LE stretches HS, LTR, SKTC, glutes, figure 4   Supine feet on pball rotations and knees to chest x10   08/21/23 NuStep L 5 x 6 min Rows green 2x10 Standing Shoulder Ext green 2x10  Hip abd green 2x10  Hip add ball squeeze 2x10  Side steps at mat HHA x2 then x1 Standing hip Abd w/ RW 2lb 2x10  LAQ 2lb 2x15 HS curls green 2x10   08/16/23- EVAL    PATIENT EDUCATION:  Education details: POC, anatomy education  Person educated: Patient Education method: Explanation Education comprehension: verbalized understanding  HOME EXERCISE PROGRAM: TBD  ASSESSMENT:  CLINICAL IMPRESSION: Patient is a 89 y.o. female who was seen today for physical therapy treatment for LBP and L leg weakness. She reports little to no change since she started. She states her back does not hurt but there is a pressure in the L side and pain in her leg still. She has met her STGs and is progressing towards LTGs. Her doctor told her to continue with  therapy and to schedule a CT. Session focused on LE strength and stretching. Some pain with ITB and glute stretch of L side but reports it feels good once released from stretch position.    OBJECTIVE IMPAIRMENTS: Abnormal gait, decreased activity tolerance, decreased balance, difficulty walking, decreased ROM, decreased strength, impaired flexibility, improper body mechanics, postural dysfunction, and pain.   ACTIVITY LIMITATIONS: carrying, lifting, bending, squatting, stairs, and locomotion level  PARTICIPATION LIMITATIONS: shopping and yard work  PERSONAL FACTORS: Age, Past/current experiences, and Time since onset of injury/illness/exacerbation are also affecting patient's functional outcome.   REHAB POTENTIAL: Good  CLINICAL DECISION MAKING: Stable/uncomplicated  EVALUATION COMPLEXITY: Low   GOALS: Goals reviewed with patient? Yes  SHORT TERM GOALS: Target date: 09/27/23  Patient will be independent with initial HEP.  Baseline:  Goal status:progressing 09/06/23  2.  Patient will complete TUG with rolling walker <15s  Baseline: 22s Goal status: Progressing 09/14/23, 14.74s MET 09/28/23   LONG TERM GOALS:  Target date: 11/08/23  Patient will be independent with advanced/ongoing HEP to improve outcomes and carryover.  Baseline:  Goal status: INITIAL  2.  Patient will report 50-75% improvement in low back pain to improve QOL. (<2/10) Baseline: 4/10 Goal status: IN PROGRESS no pain in back but pressure in the SIJ and pain in LLE  3.  Patient will report centralization of radicular symptoms.  Baseline: radicular sx in LLE Goal status: Ongoing 09/14/23, still has pain in L leg 09/28/23  4.  Patient will demonstrate improved functional strength as demonstrated by 5xSTS <15s without UE use. Baseline: 29s using chair to push up Goal status: Progressing 09/14/23, 16s uses legs against mat 2x 09/28/23   5.  Patient will complete TUG <20s with SPC Baseline: 22s with walker  Goal  status: progressing 09/25/23  6.  Patient will be able to walk at least 35ft with [facility]  Baseline: not using cane unless short distances in home Goal status: ongoing 09/25/23  PLAN:  PT FREQUENCY: 2x/week  PT DURATION: 12 weeks  PLANNED INTERVENTIONS: 97110-Therapeutic exercises, 97530- Therapeutic activity, 97112- Neuromuscular re-education, 97535- Self Care, [id number]- Manual therapy, [phone]- Gait training, [phone]- Electrical stimulation (unattended), [phone]- Traction (mechanical), Patient/Family education, Balance training, Stair training, Taping, Dry Needling, Joint mobilization, Spinal mobilization, Cryotherapy, and Moist heat  PLAN FOR NEXT SESSION: stretching for low back and LE's, balance training, strengthening LLE, HEP initiation , See what the MD says   Almetta Fam, PT 09/28/2023, 4:20 PM

## 2023-10-04 ENCOUNTER — Ambulatory Visit: Attending: Orthopedic Surgery | Admitting: Physical Therapy

## 2023-10-04 ENCOUNTER — Encounter: Payer: Self-pay | Admitting: Physical Therapy

## 2023-10-04 DIAGNOSIS — M6281 Muscle weakness (generalized): Secondary | ICD-10-CM | POA: Diagnosis present

## 2023-10-04 DIAGNOSIS — M62830 Muscle spasm of back: Secondary | ICD-10-CM | POA: Insufficient documentation

## 2023-10-04 DIAGNOSIS — R2689 Other abnormalities of gait and mobility: Secondary | ICD-10-CM | POA: Insufficient documentation

## 2023-10-04 DIAGNOSIS — M5417 Radiculopathy, lumbosacral region: Secondary | ICD-10-CM | POA: Diagnosis present

## 2023-10-04 DIAGNOSIS — M25662 Stiffness of left knee, not elsewhere classified: Secondary | ICD-10-CM | POA: Diagnosis present

## 2023-10-04 DIAGNOSIS — R2681 Unsteadiness on feet: Secondary | ICD-10-CM | POA: Insufficient documentation

## 2023-10-04 DIAGNOSIS — M5459 Other low back pain: Secondary | ICD-10-CM | POA: Diagnosis present

## 2023-10-04 DIAGNOSIS — M25562 Pain in left knee: Secondary | ICD-10-CM | POA: Insufficient documentation

## 2023-10-04 DIAGNOSIS — R262 Difficulty in walking, not elsewhere classified: Secondary | ICD-10-CM | POA: Insufficient documentation

## 2023-10-04 NOTE — Therapy (Signed)
 OUTPATIENT PHYSICAL THERAPY LOWER EXTREMITY TREATMENT    Patient Name: [name] MRN: [id number] DOB:1932/08/20, 89 y.o., female Today's Date: 10/04/2023  END OF SESSION:  PT End of Session - 10/04/23 1436     Visit Number 11    Date for PT Re-Evaluation 10/11/23    Authorization Type [employer] 13/16    PT Start Time 1437    PT Stop Time 1522    PT Time Calculation (min) 45 min    Activity Tolerance Patient tolerated treatment well    Behavior During Therapy [facility] for tasks assessed/performed              Past Medical History:  Diagnosis Date   Acalculous cholecystitis 08/24/2018   Arthritis    DVT of lower extremity (deep venous thrombosis) (HCC)    right leg - after back surgery in 2019   GERD (gastroesophageal reflux disease)    History of blood transfusion    Hypothyroidism    PONV (postoperative nausea and vomiting)    Ptosis of both eyelids    Vertigo    Wears glasses    reading   Past Surgical History:  Procedure Laterality Date   ABDOMINAL HYSTERECTOMY  1986   APPLICATION OF ROBOTIC ASSISTANCE FOR SPINAL PROCEDURE N/A 07/17/2017   Procedure: APPLICATION OF ROBOTIC ASSISTANCE FOR SPINAL PROCEDURE;  Surgeon: Colon Shove, MD;  Location: [facility] OR;  Service: Neurosurgery;  Laterality: N/A;   APPLICATION OF ROBOTIC ASSISTANCE FOR SPINAL PROCEDURE N/A 09/01/2017   Procedure: APPLICATION OF ROBOTIC ASSISTANCE FOR SPINAL PROCEDURE;  Surgeon: Colon Shove, MD;  Location: [facility] OR;  Service: Neurosurgery;  Laterality: N/A;   APPLICATION OF ROBOTIC ASSISTANCE FOR SPINAL PROCEDURE N/A 10/27/2020   Procedure: APPLICATION OF ROBOTIC ASSISTANCE FOR SPINAL PROCEDURE;  Surgeon: Colon Shove, MD;  Location: [facility] OR;  Service: Neurosurgery;  Laterality: N/A;   BACK SURGERY  1986   lumb lam   BREAST BIOPSY Right 2011   CARPAL TUNNEL RELEASE  2000   right   CARPAL TUNNEL RELEASE  2012   left   CHOLECYSTECTOMY N/A 08/24/2018   Procedure: LAPAROSCOPIC CHOLECYSTECTOMY WITH  INTRAOPERATIVE CHOLANGIOGRAM;  Surgeon: Gail Favorite, MD;  [facility] OR;  Service: General;  Laterality: N/A;   COLONOSCOPY     CYSTOCELE REPAIR  2007   sling   DISTAL INTERPHALANGEAL JOINT FUSION Right 02/04/2014   Procedure: FUSION DISTAL INTERPHALANGEAL JOINT RIGHT INDEX FINGER ;  Surgeon: Arley Curia, MD;  Location: [facility];  Service: Orthopedics;  Laterality: Right;   EYE SURGERY Bilateral    Cataract surgery with lens implant   KNEE SURGERY  2009,2011   partial knee   LAPAROSCOPY N/A 08/24/2018   Procedure: LAPAROSCOPY DIAGNOSTIC;  Surgeon: Gail Favorite, MD;  Location: [facility] OR;  Service: General;  Laterality: N/A;   LUMBAR PERCUTANEOUS PEDICLE SCREW 4 LEVEL N/A 10/27/2020   Procedure: Thoracic nine-ten Laminectomy with extension of fusion from Thoracic ten to Thoracic six with segmental fixation (cemented) with pedicle screw and mazor;  Surgeon: Colon Shove, MD;  Location: [facility] OR;  Service: Neurosurgery;  Laterality: N/A;   OPEN REDUCTION INTERNAL FIXATION (ORIF) PROXIMAL PHALANX Left 08/30/2013   Procedure: OPEN REDUCTION INTERNAL FIXATION (ORIF) PROXIMAL PHALANX FRACTURE LEFT SMALL FINGER; SPLINT RING FINGER;  Surgeon: Arley JONELLE Curia, MD;  Location: [facility];  Service: Orthopedics;  Laterality: Left;   POSTERIOR LUMBAR FUSION 4 LEVEL N/A 07/17/2017   Procedure: Thoracic Ten - Lumbar Five revison of hardware with Mazor;  Surgeon: Colon,  Victory, MD;  Location: [facility] OR;  Service: Neurosurgery;  Laterality: N/A;  Thoracic/Lumbar   PTOSIS REPAIR Bilateral 07/27/2015   Procedure: PTOSIS REPAIR;  Surgeon: Elna Pick, MD;  Location: [facility];  Service: Plastics;  Laterality: Bilateral;   SHOULDER SURGERY     rt rcr,and lt   TENOLYSIS Left 05/21/2021   Procedure: RELEASE EXTENSOR POLLICIS LONGUS LEFT;  Surgeon: Murrell Kuba, MD;  Location: [facility];  Service: Orthopedics;  Laterality: Left;   TENOLYSIS Left 06/08/2021    Procedure: RELEASE EXTENSOR POLLICIS LONGUS LEFT;  Surgeon: Murrell Kuba, MD;  Location: [facility];  Service: Orthopedics;  Laterality: Left;   TONSILLECTOMY     TOTAL KNEE ARTHROPLASTY Right 12/23/2019   Procedure: TOTAL KNEE ARTHROPLASTY;  Surgeon: Rubie Kemps, MD;  Location: [facility];  Service: Orthopedics;  Laterality: Right;   TRIGGER FINGER RELEASE Left 05/21/2021   Procedure: RELEASE TRIGGER FINGER/A-1 PULLEY LEFT MIDDLE FINGER;  Surgeon: Murrell Kuba, MD;  Location: [facility];  Service: Orthopedics;  Laterality: Left;   TRIGGER FINGER RELEASE Left 06/08/2021   Procedure: RELEASE A-1 PULLEY LEFT MIDDLE FINGER;  Surgeon: Murrell Kuba, MD;  Location: [facility];  Service: Orthopedics;  Laterality: Left;   Patient Active Problem List   Diagnosis Date Noted   Myelopathy concurrent with and due to spinal stenosis of thoracic region (HCC) 10/27/2020   S/P total knee arthroplasty, right 12/23/2019   Acalculous cholecystitis 08/24/2018   Osteoarthritis of right knee    Vertigo    DDD (degenerative disc disease), cervical    Benign essential HTN    History of DVT (deep vein thrombosis)    Tachycardia    Steroid-induced hyperglycemia    Hypothyroidism    Neuropathic pain    Acute blood loss anemia    S/P lumbar fusion    Closed L5 vertebral fracture (HCC) 08/31/2017   Closed fracture of fifth lumbar vertebra (HCC) 08/30/2017   Lumbar vertebral fracture (HCC) 07/17/2017   Scoliosis 06/13/2017    PCP: Tanda Rummer   REFERRING PROVIDER: Kemps Rubie  REFERRING DIAG:  [phone] (ICD-10-CM) - Left knee pain      THERAPY DIAG:  Radiculopathy, lumbosacral region  Muscle weakness (generalized)  Difficulty in walking, not elsewhere classified  Other low back pain  Unsteady gait  Stiffness of left knee, not elsewhere classified  Acute pain of left knee  Rationale for Evaluation and Treatment: Rehabilitation  ONSET DATE:  10/17/22  SUBJECTIVE:   SUBJECTIVE STATEMENT: Still hurting, I really hurt in my left thigh, going up and down stairs, I will have a CT scan but it has not been scheduled yet    Patient reports that she has a pinched nerve in the left leg, sees Dr. Colon 6/25  PERTINENT HISTORY: See above   PAIN:  Are you having pain? Yes: NPRS scale: 0/10 Pain location: L leg Pain description: dull, weakness Aggravating factors: laying in bed, standing for too long, stairs Relieving factors: tylenol    PRECAUTIONS: Fall  RED FLAGS: None   WEIGHT BEARING RESTRICTIONS: No  FALLS:  Has patient fallen in last 6 months? Yes. Number of falls 1, just lost my balance and my knee buckled   LIVING ENVIRONMENT: Lives with: lives with their spouse Lives in: House/apartment Stairs: Yes: Internal: 12 steps; can reach both Has following equipment at home: Vannie - 2 wheeled and Shower bench  OCCUPATION: N/A  PLOF: Independent with basic ADLs and Independent with household mobility with  device  PATIENT GOALS: to get back on my feet and get back on my cane   NEXT MD VISIT: middle of June  OBJECTIVE:  Note: Objective measures were completed at Evaluation unless otherwise noted.  DIAGNOSTIC FINDINGS: X-rays are updated and independent reviewed and shows status post total knee arthroplasty hardware well aligned. She does have what appears to be a valgus deformity of the left knee however the x-rays look okay.   COGNITION: Overall cognitive status: Within functional limits for tasks assessed     SENSATION: WFL   MUSCLE LENGTH: Hamstrings: tight in BLE, more tight in LLE  POSTURE: rounded shoulders, forward head, increased thoracic kyphosis, and flexed trunk   PALPATION: Tender in L glute and down the side of leg   LOWER EXTREMITY ROM:  Active ROM Left eval Left 09/14/23  Hip flexion    Hip extension    Hip abduction    Hip adduction    Hip internal rotation    Hip external rotation     Knee flexion 120 118  Knee extension -15 7  Ankle dorsiflexion    Ankle plantarflexion    Ankle inversion    Ankle eversion     (Blank rows = not tested)  LOWER EXTREMITY MMT:  MMT Right eval Left eval Left 09/14/23  Hip flexion 5 4- 4  Hip extension     Hip abduction     Hip adduction     Hip internal rotation     Hip external rotation     Knee flexion 5 4- 4  Knee extension 5 4- 4  Ankle dorsiflexion     Ankle plantarflexion     Ankle inversion     Ankle eversion      (Blank rows = not tested)  LOWER EXTREMITY SPECIAL TESTS:  Positive slump test  LUMBAR ROM: is very limited especially with extension, she reports she is unable to do flexion per her doctor's orders   FUNCTIONAL TESTS:  5 times sit to stand: 29s needing UE to push off  Timed up and go (TUG): 22s   GAIT: Distance walked: in clinic distances Assistive device utilized: Environmental consultant - 2 wheeled Level of assistance: Complete Independence Comments: using RW, decreased knee ext on L knee                                                                                                                                TREATMENT DATE:  10/04/23 Nustep level 5 x 6 minutes Leg extension 5# 2x12 HS curls 25# 2x12 Feet on ball K2C, rotation, bridge, isometric abs Ball b/n knees squeeze SAQ 3# Gentle passive stretch Used T-gun for STM to the left thigh hip flexor  09/28/23 TUG- 14.74s with walker 5xSTS- 16s NuStep L5x53mins  Leg ext 5# 2x12 HS curls 25# 2x12  Passive LE stretching- HS, SKTC, ITB, glutes Feet on pball rotations, knees to chest x10 Bridges with ball squeeze 2x5 [employer]  squeeze 2x10 STS 2x10  09/25/23 Passive stretch STM to the left low back, buttock Nustep level 5 x 6 minutes Leg curls 20# 2x10 5# leg extension 10# straight arm pulls Green tband clamshells Ball b/n knees squeeze Side stepping Small weighted ball STS with reach 3# marching 3# hip abduction 3# hip  extension  09/21/23 Nustep level 5 x 6 minutes Leg curls 20# 2x10 Leg ext 5# 2x10 10# straight arm pulls 2x10 Bridges 2x10 Green tband clamshells 2x10 STM with the t-gun to the left buttock, ITB and HS area LE stretching HS, SKTC, LTR   09/14/23 Nustep level 5 x 6 minutes GOALS  ROM    MMT  5x sit to stand 17 sec TUG 16.40 sec HS curls 25lb 2x12 Leg Ext 10lb 2x12 Hip add ball squeeze 2x15 4in step ups with RW x10 Side steps at mat table 5# straight arm pulls  09/11/23 Nustep level 5 x 6 minutes LEg curls 20# 2x10 Leg ext 5# 2x10 5# straight arm pulls Rows 10lb 2x10 Hip add ball squeeze 2x15 Hip abd green 2x15 Standing march  Side steps at mat table  Standing march, hip abd  in RW 2lb cuff 2x10  09/06/23 Nustep level 5 x 6 minutes LEg curls 20# 2xNustep level 5 x 6 minutes10 Leg ext 5# 2x10 5# straight arm pulls Green tband rows Green tband [employer] STM with the t-gun to the left buttock, ITB and HS area   08/31/23 NuStep L5 x64mins  Rows and ext green band 2x10 STS 2x10  Horizontal abd red band 2x10 Passive LE stretches  Bridges 2x10 SLR 2x10  08/24/23 NuStep L5x53mins  Hip abduction green 2x10 STS 2x10 2# marching 20 reps alt x2  2# hip abd and ext with RW 2x10 Passive supine LE stretches HS, LTR, SKTC, glutes, figure 4   Supine feet on pball rotations and knees to chest x10   08/21/23 NuStep L 5 x 6 min Rows green 2x10 Standing Shoulder Ext green 2x10  Hip abd green 2x10  Hip add ball squeeze 2x10  Side steps at mat HHA x2 then x1 Standing hip Abd w/ RW 2lb 2x10  LAQ 2lb 2x15 HS curls green 2x10   08/16/23- EVAL    PATIENT EDUCATION:  Education details: POC, anatomy education  Person educated: Patient Education method: Explanation Education comprehension: verbalized understanding  HOME EXERCISE PROGRAM: TBD  ASSESSMENT:  CLINICAL IMPRESSION: Patient is a 89 y.o. female who was seen today for physical therapy treatment for  LBP and L leg weakness. She is reporting increased left thigh pain reports that she thinks it is from going up and down stairs.  She was very tender in the left quad and hip flexor, seemed to feel like the stretch and STM helped.  She has met her STGs and is progressing towards LTGs. Her doctor told her to continue with therapy and to schedule a CT. Session focused on LE strength and stretching. Some pain with ITB and glute stretch of L side but reports it feels good once released from stretch position.    OBJECTIVE IMPAIRMENTS: Abnormal gait, decreased activity tolerance, decreased balance, difficulty walking, decreased ROM, decreased strength, impaired flexibility, improper body mechanics, postural dysfunction, and pain.   ACTIVITY LIMITATIONS: carrying, lifting, bending, squatting, stairs, and locomotion level  PARTICIPATION LIMITATIONS: shopping and yard work  PERSONAL FACTORS: Age, Past/current experiences, and Time since onset of injury/illness/exacerbation are also affecting patient's functional outcome.   REHAB POTENTIAL: Good  CLINICAL DECISION MAKING: Stable/uncomplicated  EVALUATION COMPLEXITY: Low   GOALS: Goals reviewed with patient? Yes  SHORT TERM GOALS: Target date: 09/27/23  Patient will be independent with initial HEP.  Baseline:  Goal status: met 10/04/23  2.  Patient will complete TUG with rolling walker <15s  Baseline: 22s Goal status: Progressing 09/14/23, 14.74s MET 09/28/23   LONG TERM GOALS: Target date: 11/08/23  Patient will be independent with advanced/ongoing HEP to improve outcomes and carryover.  Baseline:  Goal status: INITIAL  2.  Patient will report 50-75% improvement in low back pain to improve QOL. (<2/10) Baseline: 4/10 Goal status: IN PROGRESS no pain in back but pressure in the SIJ and pain in LLE 10/04/23  3.  Patient will report centralization of radicular symptoms.  Baseline: radicular sx in LLE Goal status: Ongoing 09/14/23, still has pain  in L leg 09/28/23  4.  Patient will demonstrate improved functional strength as demonstrated by 5xSTS <15s without UE use. Baseline: 29s using chair to push up Goal status: Progressing 09/14/23, 16s uses legs against mat 2x 09/28/23   5.  Patient will complete TUG <20s with SPC Baseline: 22s with walker  Goal status: progressing 09/25/23  6.  Patient will be able to walk at least 359ft with [facility]  Baseline: not using cane unless short distances in home Goal status: ongoing 10/04/23  PLAN:  PT FREQUENCY: 2x/week  PT DURATION: 12 weeks  PLANNED INTERVENTIONS: 97110-Therapeutic exercises, 97530- Therapeutic activity, 97112- Neuromuscular re-education, 97535- Self Care, [id number]- Manual therapy, [phone]- Gait training, [phone]- Electrical stimulation (unattended), [phone]- Traction (mechanical), Patient/Family education, Balance training, Stair training, Taping, Dry Needling, Joint mobilization, Spinal mobilization, Cryotherapy, and Moist heat  PLAN FOR NEXT SESSION: Will need to assess and ask for more visits the next visit   Christine Schiefelbein W, PT 10/04/2023, 2:37 PM

## 2023-10-11 ENCOUNTER — Ambulatory Visit

## 2023-10-11 DIAGNOSIS — M6281 Muscle weakness (generalized): Secondary | ICD-10-CM

## 2023-10-11 DIAGNOSIS — M5459 Other low back pain: Secondary | ICD-10-CM

## 2023-10-11 DIAGNOSIS — M5417 Radiculopathy, lumbosacral region: Secondary | ICD-10-CM

## 2023-10-11 DIAGNOSIS — R262 Difficulty in walking, not elsewhere classified: Secondary | ICD-10-CM

## 2023-10-11 DIAGNOSIS — R2689 Other abnormalities of gait and mobility: Secondary | ICD-10-CM

## 2023-10-11 NOTE — Therapy (Signed)
 OUTPATIENT PHYSICAL THERAPY LOWER EXTREMITY TREATMENT    Patient Name: [name] MRN: [id number] DOB:1933-01-07, 89 y.o., female Today's Date: 10/11/2023  END OF SESSION:  PT End of Session - 10/11/23 1525     Visit Number 12    Date for PT Re-Evaluation 11/22/23    Authorization Type --    PT Start Time 1525    PT Stop Time 1610    PT Time Calculation (min) 45 min    Activity Tolerance Patient tolerated treatment well    Behavior During Therapy [facility] for tasks assessed/performed               Past Medical History:  Diagnosis Date   Acalculous cholecystitis 08/24/2018   Arthritis    DVT of lower extremity (deep venous thrombosis) (HCC)    right leg - after back surgery in 2019   GERD (gastroesophageal reflux disease)    History of blood transfusion    Hypothyroidism    PONV (postoperative nausea and vomiting)    Ptosis of both eyelids    Vertigo    Wears glasses    reading   Past Surgical History:  Procedure Laterality Date   ABDOMINAL HYSTERECTOMY  1986   APPLICATION OF ROBOTIC ASSISTANCE FOR SPINAL PROCEDURE N/A 07/17/2017   Procedure: APPLICATION OF ROBOTIC ASSISTANCE FOR SPINAL PROCEDURE;  Surgeon: Colon Shove, MD;  Location: [facility] OR;  Service: Neurosurgery;  Laterality: N/A;   APPLICATION OF ROBOTIC ASSISTANCE FOR SPINAL PROCEDURE N/A 09/01/2017   Procedure: APPLICATION OF ROBOTIC ASSISTANCE FOR SPINAL PROCEDURE;  Surgeon: Colon Shove, MD;  Location: [facility] OR;  Service: Neurosurgery;  Laterality: N/A;   APPLICATION OF ROBOTIC ASSISTANCE FOR SPINAL PROCEDURE N/A 10/27/2020   Procedure: APPLICATION OF ROBOTIC ASSISTANCE FOR SPINAL PROCEDURE;  Surgeon: Colon Shove, MD;  Location: [facility] OR;  Service: Neurosurgery;  Laterality: N/A;   BACK SURGERY  1986   lumb lam   BREAST BIOPSY Right 2011   CARPAL TUNNEL RELEASE  2000   right   CARPAL TUNNEL RELEASE  2012   left   CHOLECYSTECTOMY N/A 08/24/2018   Procedure: LAPAROSCOPIC CHOLECYSTECTOMY WITH  INTRAOPERATIVE CHOLANGIOGRAM;  Surgeon: Gail Favorite, MD;  Location[facility] OR;  Service: General;  Laterality: N/A;   COLONOSCOPY     CYSTOCELE REPAIR  2007   sling   DISTAL INTERPHALANGEAL JOINT FUSION Right 02/04/2014   Procedure: FUSION DISTAL INTERPHALANGEAL JOINT RIGHT INDEX FINGER ;  Surgeon: Arley Curia, MD;  Location: [facility];  Service: Orthopedics;  Laterality: Right;   EYE SURGERY Bilateral    Cataract surgery with lens implant   KNEE SURGERY  2009,2011   partial knee   LAPAROSCOPY N/A 08/24/2018   Procedure: LAPAROSCOPY DIAGNOSTIC;  Surgeon: Gail Favorite, MD;  Location: [facility] OR;  Service: General;  Laterality: N/A;   LUMBAR PERCUTANEOUS PEDICLE SCREW 4 LEVEL N/A 10/27/2020   Procedure: Thoracic nine-ten Laminectomy with extension of fusion from Thoracic ten to Thoracic six with segmental fixation (cemented) with pedicle screw and mazor;  Surgeon: Colon Shove, MD;  Location: [facility] OR;  Service: Neurosurgery;  Laterality: N/A;   OPEN REDUCTION INTERNAL FIXATION (ORIF) PROXIMAL PHALANX Left 08/30/2013   Procedure: OPEN REDUCTION INTERNAL FIXATION (ORIF) PROXIMAL PHALANX FRACTURE LEFT SMALL FINGER; SPLINT RING FINGER;  Surgeon: Arley JONELLE Curia, MD;  Location:  SURGERY CENTER;  Service: Orthopedics;  Laterality: Left;   POSTERIOR LUMBAR FUSION 4 LEVEL N/A 07/17/2017   Procedure: Thoracic Ten - Lumbar Five revison of hardware with Mazor;  Surgeon: Colon,  Victory, MD;  Location: [facility] OR;  Service: Neurosurgery;  Laterality: N/A;  Thoracic/Lumbar   PTOSIS REPAIR Bilateral 07/27/2015   Procedure: PTOSIS REPAIR;  Surgeon: Elna Pick, MD;  Location: [facility];  Service: Plastics;  Laterality: Bilateral;   SHOULDER SURGERY     rt rcr,and lt   TENOLYSIS Left 05/21/2021   Procedure: RELEASE EXTENSOR POLLICIS LONGUS LEFT;  Surgeon: Murrell Kuba, MD;  Location: [facility];  Service: Orthopedics;  Laterality: Left;   TENOLYSIS Left 06/08/2021    Procedure: RELEASE EXTENSOR POLLICIS LONGUS LEFT;  Surgeon: Murrell Kuba, MD;  Location: [facility];  Service: Orthopedics;  Laterality: Left;   TONSILLECTOMY     TOTAL KNEE ARTHROPLASTY Right 12/23/2019   Procedure: TOTAL KNEE ARTHROPLASTY;  Surgeon: Rubie Kemps, MD;  Location: [facility] ORS;  Service: Orthopedics;  Laterality: Right;   TRIGGER FINGER RELEASE Left 05/21/2021   Procedure: RELEASE TRIGGER FINGER/A-1 PULLEY LEFT MIDDLE FINGER;  Surgeon: Murrell Kuba, MD;  Location: [facility];  Service: Orthopedics;  Laterality: Left;   TRIGGER FINGER RELEASE Left 06/08/2021   Procedure: RELEASE A-1 PULLEY LEFT MIDDLE FINGER;  Surgeon: Murrell Kuba, MD;  Location: [facility];  Service: Orthopedics;  Laterality: Left;   Patient Active Problem List   Diagnosis Date Noted   Myelopathy concurrent with and due to spinal stenosis of thoracic region (HCC) 10/27/2020   S/P total knee arthroplasty, right 12/23/2019   Acalculous cholecystitis 08/24/2018   Osteoarthritis of right knee    Vertigo    DDD (degenerative disc disease), cervical    Benign essential HTN    History of DVT (deep vein thrombosis)    Tachycardia    Steroid-induced hyperglycemia    Hypothyroidism    Neuropathic pain    Acute blood loss anemia    S/P lumbar fusion    Closed L5 vertebral fracture (HCC) 08/31/2017   Closed fracture of fifth lumbar vertebra (HCC) 08/30/2017   Lumbar vertebral fracture (HCC) 07/17/2017   Scoliosis 06/13/2017    PCP: Tanda Rummer   REFERRING PROVIDER: Kemps Rubie  REFERRING DIAG:  [phone] (ICD-10-CM) - Left knee pain      THERAPY DIAG:  Radiculopathy, lumbosacral region  Muscle weakness (generalized)  Difficulty in walking, not elsewhere classified  Other low back pain  Other abnormalities of gait and mobility  Rationale for Evaluation and Treatment: Rehabilitation  ONSET DATE: 10/17/22  SUBJECTIVE:   SUBJECTIVE STATEMENT: I am okay,  just sore. Still having the pain in my leg.     Patient reports that she has a pinched nerve in the left leg, sees Dr. Colon 6/25  PERTINENT HISTORY: See above   PAIN:  Are you having pain? Yes: NPRS scale: 0/10 Pain location: L leg Pain description: dull, weakness Aggravating factors: laying in bed, standing for too long, stairs Relieving factors: tylenol    PRECAUTIONS: Fall  RED FLAGS: None   WEIGHT BEARING RESTRICTIONS: No  FALLS:  Has patient fallen in last 6 months? Yes. Number of falls 1, just lost my balance and my knee buckled   LIVING ENVIRONMENT: Lives with: lives with their spouse Lives in: House/apartment Stairs: Yes: Internal: 12 steps; can reach both Has following equipment at home: Vannie - 2 wheeled and Tour manager  OCCUPATION: N/A  PLOF: Independent with basic ADLs and Independent with household mobility with device  PATIENT GOALS: to get back on my feet and get back on my cane   NEXT MD VISIT: middle of June  OBJECTIVE:  Note: Objective measures were completed at Evaluation unless otherwise noted.  DIAGNOSTIC FINDINGS: X-rays are updated and independent reviewed and shows status post total knee arthroplasty hardware well aligned. She does have what appears to be a valgus deformity of the left knee however the x-rays look okay.   COGNITION: Overall cognitive status: Within functional limits for tasks assessed     SENSATION: WFL   MUSCLE LENGTH: Hamstrings: tight in BLE, more tight in LLE  POSTURE: rounded shoulders, forward head, increased thoracic kyphosis, and flexed trunk   PALPATION: Tender in L glute and down the side of leg   LOWER EXTREMITY ROM:  Active ROM Left eval Left 09/14/23  Hip flexion    Hip extension    Hip abduction    Hip adduction    Hip internal rotation    Hip external rotation    Knee flexion 120 118  Knee extension -15 7  Ankle dorsiflexion    Ankle plantarflexion    Ankle inversion    Ankle eversion      (Blank rows = not tested)  LOWER EXTREMITY MMT:  MMT Right eval Left eval Left 09/14/23  Hip flexion 5 4- 4  Hip extension     Hip abduction     Hip adduction     Hip internal rotation     Hip external rotation     Knee flexion 5 4- 4  Knee extension 5 4- 4  Ankle dorsiflexion     Ankle plantarflexion     Ankle inversion     Ankle eversion      (Blank rows = not tested)  LOWER EXTREMITY SPECIAL TESTS:  Positive slump test  LUMBAR ROM: is very limited especially with extension, she reports she is unable to do flexion per her doctor's orders   FUNCTIONAL TESTS:  5 times sit to stand: 29s needing UE to push off  Timed up and go (TUG): 22s   GAIT: Distance walked: in clinic distances Assistive device utilized: Environmental consultant - 2 wheeled Level of assistance: Complete Independence Comments: using RW, decreased knee ext on L knee                                                                                                                                TREATMENT DATE:  10/11/23 Recert TUG- 17.11s 5xSTS- 17.30s NuStep L5 x20mins Leg ext 5# 2x12 HS curls 25# 2x12 Calf stretch on slant  Calf raises 2x10 Passive LE stretches  SLR x10   10/04/23 Nustep level 5 x 6 minutes Leg extension 5# 2x12 HS curls 25# 2x12 Feet on ball K2C, rotation, bridge, isometric abs Ball b/n knees squeeze SAQ 3# Gentle passive stretch Used T-gun for STM to the left thigh hip flexor  09/28/23 TUG- 14.74s with walker 5xSTS- 16s NuStep L5x93mins  Leg ext 5# 2x12 HS curls 25# 2x12  Passive LE stretching- HS, SKTC, ITB, glutes Feet on pball rotations, knees to  chest x10 Bridges with ball squeeze 2x5 Ball squeeze 2x10 STS 2x10  09/25/23 Passive stretch STM to the left low back, buttock Nustep level 5 x 6 minutes Leg curls 20# 2x10 5# leg extension 10# straight arm pulls Green tband clamshells Ball b/n knees squeeze Side stepping Small weighted ball STS with reach 3# marching 3# hip  abduction 3# hip extension  09/21/23 Nustep level 5 x 6 minutes Leg curls 20# 2x10 Leg ext 5# 2x10 10# straight arm pulls 2x10 Bridges 2x10 Green tband clamshells 2x10 STM with the t-gun to the left buttock, ITB and HS area LE stretching HS, SKTC, LTR   09/14/23 Nustep level 5 x 6 minutes GOALS  ROM    MMT  5x sit to stand 17 sec TUG 16.40 sec HS curls 25lb 2x12 Leg Ext 10lb 2x12 Hip add ball squeeze 2x15 4in step ups with RW x10 Side steps at mat table 5# straight arm pulls  09/11/23 Nustep level 5 x 6 minutes LEg curls 20# 2x10 Leg ext 5# 2x10 5# straight arm pulls Rows 10lb 2x10 Hip add ball squeeze 2x15 Hip abd green 2x15 Standing march  Side steps at mat table  Standing march, hip abd  in RW 2lb cuff 2x10  09/06/23 Nustep level 5 x 6 minutes LEg curls 20# 2xNustep level 5 x 6 minutes10 Leg ext 5# 2x10 5# straight arm pulls Green tband rows Green tband [employer] STM with the t-gun to the left buttock, ITB and HS area   08/31/23 NuStep L5 x6mins  Rows and ext green band 2x10 STS 2x10  Horizontal abd red band 2x10 Passive LE stretches  Bridges 2x10 SLR 2x10  08/24/23 NuStep L5x48mins  Hip abduction green 2x10 STS 2x10 2# marching 20 reps alt x2  2# hip abd and ext with RW 2x10 Passive supine LE stretches HS, LTR, SKTC, glutes, figure 4   Supine feet on pball rotations and knees to chest x10   08/21/23 NuStep L 5 x 6 min Rows green 2x10 Standing Shoulder Ext green 2x10  Hip abd green 2x10  Hip add ball squeeze 2x10  Side steps at mat HHA x2 then x1 Standing hip Abd w/ RW 2lb 2x10  LAQ 2lb 2x15 HS curls green 2x10   08/16/23- EVAL    PATIENT EDUCATION:  Education details: POC, anatomy education  Person educated: Patient Education method: Explanation Education comprehension: verbalized understanding  HOME EXERCISE PROGRAM: TBD  ASSESSMENT:  CLINICAL IMPRESSION: Patient is a 89 y.o. female who was seen today for physical  therapy treatment for LBP and L leg weakness. She continues to have the pain in her L side. Not word of getting her CT scan yet. She would like to continue with PT to work on stretching, strengthening, and she enjoys using the machines. Recerted for another month. Tightness in hip and low back with stretching today. Some pain with SLR on L side.    OBJECTIVE IMPAIRMENTS: Abnormal gait, decreased activity tolerance, decreased balance, difficulty walking, decreased ROM, decreased strength, impaired flexibility, improper body mechanics, postural dysfunction, and pain.   ACTIVITY LIMITATIONS: carrying, lifting, bending, squatting, stairs, and locomotion level  PARTICIPATION LIMITATIONS: shopping and yard work  PERSONAL FACTORS: Age, Past/current experiences, and Time since onset of injury/illness/exacerbation are also affecting patient's functional outcome.   REHAB POTENTIAL: Good  CLINICAL DECISION MAKING: Stable/uncomplicated  EVALUATION COMPLEXITY: Low   GOALS: Goals reviewed with patient? Yes  SHORT TERM GOALS: Target date: 09/27/23  Patient will be independent with initial HEP.  Baseline:  Goal status: met 10/04/23  2.  Patient will complete TUG with rolling walker <15s  Baseline: 22s Goal status: Progressing 09/14/23, 14.74s MET 09/28/23   LONG TERM GOALS: Target date: 11/22/23  Patient will be independent with advanced/ongoing HEP to improve outcomes and carryover.  Baseline:  Goal status: INITIAL  2.  Patient will report 50-75% improvement in low back pain to improve QOL. (<2/10) Baseline: 4/10 Goal status: IN PROGRESS no pain in back but pressure in the SIJ and pain in LLE 10/04/23  3.  Patient will report centralization of radicular symptoms.  Baseline: radicular sx in LLE Goal status: Ongoing 09/14/23, still has pain in L leg 09/28/23, ongoing 10/11/23  4.  Patient will demonstrate improved functional strength as demonstrated by 5xSTS <15s without UE use. Baseline: 29s using  chair to push up Goal status: Progressing 09/14/23, 16s uses legs against mat 2x 09/28/23, 17s 10/11/23  5.  Patient will complete TUG <20s with SPC Baseline: 22s with walker  Goal status: progressing 09/25/23, 17s 10/11/23  6.  Patient will be able to walk at least 317ft with [facility]  Baseline: not using cane unless short distances in home Goal status: ongoing 10/04/23  PLAN:  PT FREQUENCY: 2x/week  PT DURATION: 12 weeks  PLANNED INTERVENTIONS: 97110-Therapeutic exercises, 97530- Therapeutic activity, 97112- Neuromuscular re-education, 97535- Self Care, [id number]- Manual therapy, [phone]- Gait training, [phone]- Electrical stimulation (unattended), [phone]- Traction (mechanical), Patient/Family education, Balance training, Stair training, Taping, Dry Needling, Joint mobilization, Spinal mobilization, Cryotherapy, and Moist heat  PLAN FOR NEXT SESSION:    Almetta Fam, PT 10/11/2023, 4:08 PM

## 2023-10-18 ENCOUNTER — Other Ambulatory Visit (HOSPITAL_COMMUNITY): Payer: Self-pay | Admitting: Neurological Surgery

## 2023-10-18 ENCOUNTER — Other Ambulatory Visit: Payer: Self-pay | Admitting: Neurological Surgery

## 2023-10-18 DIAGNOSIS — M47816 Spondylosis without myelopathy or radiculopathy, lumbar region: Secondary | ICD-10-CM

## 2023-10-24 ENCOUNTER — Encounter: Payer: Self-pay | Admitting: Physical Therapy

## 2023-10-24 ENCOUNTER — Ambulatory Visit: Admitting: Physical Therapy

## 2023-10-24 DIAGNOSIS — M25662 Stiffness of left knee, not elsewhere classified: Secondary | ICD-10-CM

## 2023-10-24 DIAGNOSIS — M5417 Radiculopathy, lumbosacral region: Secondary | ICD-10-CM

## 2023-10-24 DIAGNOSIS — R2681 Unsteadiness on feet: Secondary | ICD-10-CM

## 2023-10-24 DIAGNOSIS — M5459 Other low back pain: Secondary | ICD-10-CM

## 2023-10-24 DIAGNOSIS — R262 Difficulty in walking, not elsewhere classified: Secondary | ICD-10-CM

## 2023-10-24 DIAGNOSIS — M6281 Muscle weakness (generalized): Secondary | ICD-10-CM

## 2023-10-24 DIAGNOSIS — R2689 Other abnormalities of gait and mobility: Secondary | ICD-10-CM

## 2023-10-24 NOTE — Therapy (Signed)
 OUTPATIENT PHYSICAL THERAPY LOWER EXTREMITY TREATMENT    Patient Name: [name] MRN: [id number] DOB:[birth year], 89 y.o., female Today's Date: 10/24/2023  END OF SESSION:  PT End of Session - 10/24/23 1250     Visit Number 13    Date for PT Re-Evaluation 11/09/23    Authorization Type [employer] 1 of 4    PT Start Time 1250    PT Stop Time 1345    PT Time Calculation (min) 55 min    Activity Tolerance Patient tolerated treatment well    Behavior During Therapy WFL for tasks assessed/performed               Past Medical History:  Diagnosis Date   Acalculous cholecystitis 08/24/2018   Arthritis    DVT of lower extremity (deep venous thrombosis) (HCC)    right leg - after back surgery in 2019   GERD (gastroesophageal reflux disease)    History of blood transfusion    Hypothyroidism    PONV (postoperative nausea and vomiting)    Ptosis of both eyelids    Vertigo    Wears glasses    reading   Past Surgical History:  Procedure Laterality Date   ABDOMINAL HYSTERECTOMY  1986   APPLICATION OF ROBOTIC ASSISTANCE FOR SPINAL PROCEDURE N/A 07/17/2017   Procedure: APPLICATION OF ROBOTIC ASSISTANCE FOR SPINAL PROCEDURE;  Surgeon: Colon Shove, MD;  Location: [facility] OR;  Service: Neurosurgery;  Laterality: N/A;   APPLICATION OF ROBOTIC ASSISTANCE FOR SPINAL PROCEDURE N/A 09/01/2017   Procedure: APPLICATION OF ROBOTIC ASSISTANCE FOR SPINAL PROCEDURE;  Surgeon: Colon Shove, MD;  Location: [facility] OR;  Service: Neurosurgery;  Laterality: N/A;   APPLICATION OF ROBOTIC ASSISTANCE FOR SPINAL PROCEDURE N/A 10/27/2020   Procedure: APPLICATION OF ROBOTIC ASSISTANCE FOR SPINAL PROCEDURE;  Surgeon: Colon Shove, MD;  Location: [facility] OR;  Service: Neurosurgery;  Laterality: N/A;   BACK SURGERY  1986   lumb lam   BREAST BIOPSY Right 2011   CARPAL TUNNEL RELEASE  2000   right   CARPAL TUNNEL RELEASE  2012   left   CHOLECYSTECTOMY N/A 08/24/2018   Procedure: LAPAROSCOPIC CHOLECYSTECTOMY WITH  INTRAOPERATIVE CHOLANGIOGRAM;  Surgeon: Gail Favorite, MD;  Location: [facility] OR;  Service: General;  Laterality: N/A;   COLONOSCOPY     CYSTOCELE REPAIR  2007   sling   DISTAL INTERPHALANGEAL JOINT FUSION Right 02/04/2014   Procedure: FUSION DISTAL INTERPHALANGEAL JOINT RIGHT INDEX FINGER ;  Surgeon: Arley Curia, MD;  Location: [facility];  Service: Orthopedics;  Laterality: Right;   EYE SURGERY Bilateral    Cataract surgery with lens implant   KNEE SURGERY  2009,2011   partial knee   LAPAROSCOPY N/A 08/24/2018   Procedure: LAPAROSCOPY DIAGNOSTIC;  Surgeon: Gail Favorite, MD;  Location: [facility] OR;  Service: General;  Laterality: N/A;   LUMBAR PERCUTANEOUS PEDICLE SCREW 4 LEVEL N/A 10/27/2020   Procedure: Thoracic nine-ten Laminectomy with extension of fusion from Thoracic ten to Thoracic six with segmental fixation (cemented) with pedicle screw and mazor;  Surgeon: Colon Shove, MD;  Location: [facility] OR;  Service: Neurosurgery;  Laterality: N/A;   OPEN REDUCTION INTERNAL FIXATION (ORIF) PROXIMAL PHALANX Left 08/30/2013   Procedure: OPEN REDUCTION INTERNAL FIXATION (ORIF) PROXIMAL PHALANX FRACTURE LEFT SMALL FINGER; SPLINT RING FINGER;  Surgeon: Arley JONELLE Curia, MD;  Location: [facility];  Service: Orthopedics;  Laterality: Left;   POSTERIOR LUMBAR FUSION 4 LEVEL N/A 07/17/2017   Procedure: Thoracic Ten - Lumbar Five revison of hardware with Mazor;  Surgeon: Colon Shove, MD;  Location: [facility] OR;  Service: Neurosurgery;  Laterality: N/A;  Thoracic/Lumbar   PTOSIS REPAIR Bilateral 07/27/2015   Procedure: PTOSIS REPAIR;  Surgeon: Elna Pick, MD;  Location: [facility];  Service: Plastics;  Laterality: Bilateral;   SHOULDER SURGERY     rt rcr,and lt   TENOLYSIS Left 05/21/2021   Procedure: RELEASE EXTENSOR POLLICIS LONGUS LEFT;  Surgeon: Murrell Kuba, MD;  Location: [facility];  Service: Orthopedics;  Laterality: Left;   TENOLYSIS Left 06/08/2021    Procedure: RELEASE EXTENSOR POLLICIS LONGUS LEFT;  Surgeon: Murrell Kuba, MD;  Location: [facility];  Service: Orthopedics;  Laterality: Left;   TONSILLECTOMY     TOTAL KNEE ARTHROPLASTY Right 12/23/2019   Procedure: TOTAL KNEE ARTHROPLASTY;  Surgeon: Rubie Kemps, MD;  Location: [facility];  Service: Orthopedics;  Laterality: Right;   TRIGGER FINGER RELEASE Left 05/21/2021   Procedure: RELEASE TRIGGER FINGER/A-1 PULLEY LEFT MIDDLE FINGER;  Surgeon: Murrell Kuba, MD;  Location: [facility];  Service: Orthopedics;  Laterality: Left;   TRIGGER FINGER RELEASE Left 06/08/2021   Procedure: RELEASE A-1 PULLEY LEFT MIDDLE FINGER;  Surgeon: Murrell Kuba, MD;  Location: [facility];  Service: Orthopedics;  Laterality: Left;   Patient Active Problem List   Diagnosis Date Noted   Myelopathy concurrent with and due to spinal stenosis of thoracic region (HCC) 10/27/2020   S/P total knee arthroplasty, right 12/23/2019   Acalculous cholecystitis 08/24/2018   Osteoarthritis of right knee    Vertigo    DDD (degenerative disc disease), cervical    Benign essential HTN    History of DVT (deep vein thrombosis)    Tachycardia    Steroid-induced hyperglycemia    Hypothyroidism    Neuropathic pain    Acute blood loss anemia    S/P lumbar fusion    Closed L5 vertebral fracture (HCC) 08/31/2017   Closed fracture of fifth lumbar vertebra (HCC) 08/30/2017   Lumbar vertebral fracture (HCC) 07/17/2017   Scoliosis 06/13/2017    PCP: Tanda Rummer   REFERRING PROVIDER: Kemps Rubie  REFERRING DIAG:  [phone] (ICD-10-CM) - Left knee pain      THERAPY DIAG:  Radiculopathy, lumbosacral region  Muscle weakness (generalized)  Difficulty in walking, not elsewhere classified  Other low back pain  Other abnormalities of gait and mobility  Unsteady gait  Stiffness of left knee, not elsewhere classified  Rationale for Evaluation and Treatment:  Rehabilitation  ONSET DATE: 10/17/22  SUBJECTIVE:   SUBJECTIVE STATEMENT: Patient reports that she is having a study with dye on August 6th.  She reports that she saw the knee Dr and he felt it was the back.      Patient reports that she has a pinched nerve in the left leg, sees Dr. Colon 6/25  PERTINENT HISTORY: See above   PAIN:  Are you having pain? Yes: NPRS scale: 0/10 Pain location: L leg Pain description: dull, weakness Aggravating factors: laying in bed, standing for too long, stairs Relieving factors: tylenol    PRECAUTIONS: Fall  RED FLAGS: None   WEIGHT BEARING RESTRICTIONS: No  FALLS:  Has patient fallen in last 6 months? Yes. Number of falls 1, just lost my balance and my knee buckled   LIVING ENVIRONMENT: Lives with: lives with their spouse Lives in: House/apartment Stairs: Yes: Internal: 12 steps; can reach both Has following equipment at home: Vannie - 2 wheeled and Shower bench  OCCUPATION: N/A  PLOF: Independent with basic  ADLs and Independent with household mobility with device  PATIENT GOALS: to get back on my feet and get back on my cane   NEXT MD VISIT: middle of June  OBJECTIVE:  Note: Objective measures were completed at Evaluation unless otherwise noted.  DIAGNOSTIC FINDINGS: X-rays are updated and independent reviewed and shows status post total knee arthroplasty hardware well aligned. She does have what appears to be a valgus deformity of the left knee however the x-rays look okay.   COGNITION: Overall cognitive status: Within functional limits for tasks assessed     SENSATION: WFL   MUSCLE LENGTH: Hamstrings: tight in BLE, more tight in LLE  POSTURE: rounded shoulders, forward head, increased thoracic kyphosis, and flexed trunk   PALPATION: Tender in L glute and down the side of leg   LOWER EXTREMITY ROM:  Active ROM Left eval Left 09/14/23  Hip flexion    Hip extension    Hip abduction    Hip adduction    Hip  internal rotation    Hip external rotation    Knee flexion 120 118  Knee extension -15 7  Ankle dorsiflexion    Ankle plantarflexion    Ankle inversion    Ankle eversion     (Blank rows = not tested)  LOWER EXTREMITY MMT:  MMT Right eval Left eval Left 09/14/23  Hip flexion 5 4- 4  Hip extension     Hip abduction     Hip adduction     Hip internal rotation     Hip external rotation     Knee flexion 5 4- 4  Knee extension 5 4- 4  Ankle dorsiflexion     Ankle plantarflexion     Ankle inversion     Ankle eversion      (Blank rows = not tested)  LOWER EXTREMITY SPECIAL TESTS:  Positive slump test  LUMBAR ROM: is very limited especially with extension, she reports she is unable to do flexion per her doctor's orders   FUNCTIONAL TESTS:  5 times sit to stand: 29s needing UE to push off  Timed up and go (TUG): 22s   GAIT: Distance walked: in clinic distances Assistive device utilized: Environmental consultant - 2 wheeled Level of assistance: Complete Independence Comments: using RW, decreased knee ext on L knee                                                                                                                                TREATMENT DATE:  10/24/23 Nustep level 5 x 8 minutes Leg extension 10# 3x10 Leg curls 25# All gait in the clinic with two SPC's 10# straight arm pulls with cues for posture and core activation Seated rows 20# 3x10 LAts 20# 3x10 Black tband lumbar extension 2.5# marches with walker 2.5# hip abduction 2.5# hip extension Feet on ball K2C, rotation, bridge, isometric abs Green tband clamshells Ball b/n knees squeeze  10/11/23 Recert TUG- 17.11s 5xSTS- 17.30s  NuStep L5 x5mins Leg ext 5# 2x12 HS curls 25# 2x12 Calf stretch on slant  Calf raises 2x10 Passive LE stretches  SLR x10   10/04/23 Nustep level 5 x 6 minutes Leg extension 5# 2x12 HS curls 25# 2x12 Feet on ball K2C, rotation, bridge, isometric abs Ball b/n knees squeeze SAQ 3# Gentle  passive stretch Used T-gun for STM to the left thigh hip flexor  09/28/23 TUG- 14.74s with walker 5xSTS- 16s NuStep L5x33mins  Leg ext 5# 2x12 HS curls 25# 2x12  Passive LE stretching- HS, SKTC, ITB, glutes Feet on pball rotations, knees to chest x10 Bridges with ball squeeze 2x5 Ball squeeze 2x10 STS 2x10  09/25/23 Passive stretch STM to the left low back, buttock Nustep level 5 x 6 minutes Leg curls 20# 2x10 5# leg extension 10# straight arm pulls Green tband clamshells Ball b/n knees squeeze Side stepping Small weighted ball STS with reach 3# marching 3# hip abduction 3# hip extension  09/21/23 Nustep level 5 x 6 minutes Leg curls 20# 2x10 Leg ext 5# 2x10 10# straight arm pulls 2x10 Bridges 2x10 Green tband clamshells 2x10 STM with the t-gun to the left buttock, ITB and HS area LE stretching HS, SKTC, LTR   09/14/23 Nustep level 5 x 6 minutes GOALS  ROM    MMT  5x sit to stand 17 sec TUG 16.40 sec HS curls 25lb 2x12 Leg Ext 10lb 2x12 Hip add ball squeeze 2x15 4in step ups with RW x10 Side steps at mat table 5# straight arm pulls  09/11/23 Nustep level 5 x 6 minutes LEg curls 20# 2x10 Leg ext 5# 2x10 5# straight arm pulls Rows 10lb 2x10 Hip add ball squeeze 2x15 Hip abd green 2x15 Standing march  Side steps at mat table  Standing march, hip abd  in RW 2lb cuff 2x10  09/06/23 Nustep level 5 x 6 minutes LEg curls 20# 2xNustep level 5 x 6 minutes10 Leg ext 5# 2x10 5# straight arm pulls Green tband rows Green tband [employer] STM with the t-gun to the left buttock, ITB and HS area   08/31/23 NuStep L5 x71mins  Rows and ext green band 2x10 STS 2x10  Horizontal abd red band 2x10 Passive LE stretches  Bridges 2x10 SLR 2x10  08/24/23 NuStep L5x67mins  Hip abduction green 2x10 STS 2x10 2# marching 20 reps alt x2  2# hip abd and ext with RW 2x10 Passive supine LE stretches HS, LTR, SKTC, glutes, figure 4   Supine feet on pball rotations  and knees to chest x10   08/21/23 NuStep L 5 x 6 min Rows green 2x10 Standing Shoulder Ext green 2x10  Hip abd green 2x10  Hip add ball squeeze 2x10  Side steps at mat HHA x2 then x1 Standing hip Abd w/ RW 2lb 2x10  LAQ 2lb 2x15 HS curls green 2x10   08/16/23- EVAL    PATIENT EDUCATION:  Education details: POC, anatomy education  Person educated: Patient Education method: Explanation Education comprehension: verbalized understanding  HOME EXERCISE PROGRAM: TBD  ASSESSMENT:  CLINICAL IMPRESSION: Patient is a 89 y.o. female who was seen today for physical therapy treatment for LBP and L leg weakness. She continues to have the pain in her L side. She is now scheduled for a CT scan on August 6th.  The left LE is weaker and she has more difficulty with control especially eccentric activity and fatigues quickly.   OBJECTIVE IMPAIRMENTS: Abnormal gait, decreased activity tolerance, decreased balance, difficulty walking, decreased ROM,  decreased strength, impaired flexibility, improper body mechanics, postural dysfunction, and pain.   ACTIVITY LIMITATIONS: carrying, lifting, bending, squatting, stairs, and locomotion level  PARTICIPATION LIMITATIONS: shopping and yard work  PERSONAL FACTORS: Age, Past/current experiences, and Time since onset of injury/illness/exacerbation are also affecting patient's functional outcome.   REHAB POTENTIAL: Good  CLINICAL DECISION MAKING: Stable/uncomplicated  EVALUATION COMPLEXITY: Low   GOALS: Goals reviewed with patient? Yes  SHORT TERM GOALS: Target date: 09/27/23  Patient will be independent with initial HEP.  Baseline:  Goal status: met 10/04/23  2.  Patient will complete TUG with rolling walker <15s  Baseline: 22s Goal status: Progressing 09/14/23, 14.74s MET 09/28/23   LONG TERM GOALS: Target date: 11/22/23  Patient will be independent with advanced/ongoing HEP to improve outcomes and carryover.  Baseline:  Goal status:  progressing 10/24/23  2.  Patient will report 50-75% improvement in low back pain to improve QOL. (<2/10) Baseline: 4/10 Goal status: IN PROGRESS no pain in back but pressure in the SIJ and pain in LLE 10/04/23  3.  Patient will report centralization of radicular symptoms.  Baseline: radicular sx in LLE Goal status: Ongoing 09/14/23, still has pain in L leg 09/28/23, progressing 10/24/23  4.  Patient will demonstrate improved functional strength as demonstrated by 5xSTS <15s without UE use. Baseline: 29s using chair to push up Goal status: Progressing 09/14/23, 16s uses legs against mat 2x 09/28/23, 17s 10/11/23  5.  Patient will complete TUG <20s with SPC Baseline: 22s with walker  Goal status: progressing 09/25/23, 17s 10/11/23  6.  Patient will be able to walk at least 350ft with [facility]  Baseline: not using cane unless short distances in home Goal status: ongoing 10/04/23  PLAN:  PT FREQUENCY: 2x/week  PT DURATION: 12 weeks  PLANNED INTERVENTIONS: 97110-Therapeutic exercises, 97530- Therapeutic activity, 97112- Neuromuscular re-education, 97535- Self Care, [id number]- Manual therapy, [phone]- Gait training, [phone]- Electrical stimulation (unattended), [phone]- Traction (mechanical), Patient/Family education, Balance training, Stair training, Taping, Dry Needling, Joint mobilization, Spinal mobilization, Cryotherapy, and Moist heat  PLAN FOR NEXT SESSION: insurance authorized 4 more visits today is visit 1 of 4   Coran Dipaola W, PT 10/24/2023, 12:51 PM

## 2023-10-26 ENCOUNTER — Encounter: Payer: Self-pay | Admitting: Physical Therapy

## 2023-10-26 ENCOUNTER — Ambulatory Visit: Admitting: Physical Therapy

## 2023-10-26 DIAGNOSIS — M5417 Radiculopathy, lumbosacral region: Secondary | ICD-10-CM

## 2023-10-26 DIAGNOSIS — M5459 Other low back pain: Secondary | ICD-10-CM

## 2023-10-26 DIAGNOSIS — M6281 Muscle weakness (generalized): Secondary | ICD-10-CM

## 2023-10-26 DIAGNOSIS — M62830 Muscle spasm of back: Secondary | ICD-10-CM

## 2023-10-26 DIAGNOSIS — R262 Difficulty in walking, not elsewhere classified: Secondary | ICD-10-CM

## 2023-10-26 DIAGNOSIS — M25562 Pain in left knee: Secondary | ICD-10-CM

## 2023-10-26 DIAGNOSIS — R2689 Other abnormalities of gait and mobility: Secondary | ICD-10-CM

## 2023-10-26 DIAGNOSIS — R2681 Unsteadiness on feet: Secondary | ICD-10-CM

## 2023-10-26 DIAGNOSIS — M25662 Stiffness of left knee, not elsewhere classified: Secondary | ICD-10-CM

## 2023-10-26 NOTE — Therapy (Signed)
 OUTPATIENT PHYSICAL THERAPY LOWER EXTREMITY TREATMENT    Patient Name: [name] MRN: [id number] DOB:November 07, 1932, 89 y.o., female Today's Date: 10/26/2023  END OF SESSION:  PT End of Session - 10/26/23 1309     Visit Number 14    Date for PT Re-Evaluation 11/09/23    Authorization Type [employer] 2 of 4    PT Start Time 1308    PT Stop Time 1355    PT Time Calculation (min) 47 min    Activity Tolerance Patient tolerated treatment well    Behavior During Therapy WFL for tasks assessed/performed               Past Medical History:  Diagnosis Date   Acalculous cholecystitis 08/24/2018   Arthritis    DVT of lower extremity (deep venous thrombosis) (HCC)    right leg - after back surgery in 2019   GERD (gastroesophageal reflux disease)    History of blood transfusion    Hypothyroidism    PONV (postoperative nausea and vomiting)    Ptosis of both eyelids    Vertigo    Wears glasses    reading   Past Surgical History:  Procedure Laterality Date   ABDOMINAL HYSTERECTOMY  1986   APPLICATION OF ROBOTIC ASSISTANCE FOR SPINAL PROCEDURE N/A 07/17/2017   Procedure: APPLICATION OF ROBOTIC ASSISTANCE FOR SPINAL PROCEDURE;  Surgeon: Colon Shove, MD;  Location: [facility] OR;  Service: Neurosurgery;  Laterality: N/A;   APPLICATION OF ROBOTIC ASSISTANCE FOR SPINAL PROCEDURE N/A 09/01/2017   Procedure: APPLICATION OF ROBOTIC ASSISTANCE FOR SPINAL PROCEDURE;  Surgeon: Colon Shove, MD;  Location: [facility] OR;  Service: Neurosurgery;  Laterality: N/A;   APPLICATION OF ROBOTIC ASSISTANCE FOR SPINAL PROCEDURE N/A 10/27/2020   Procedure: APPLICATION OF ROBOTIC ASSISTANCE FOR SPINAL PROCEDURE;  Surgeon: Colon Shove, MD;  Location: [facility] OR;  Service: Neurosurgery;  Laterality: N/A;   BACK SURGERY  1986   lumb lam   BREAST BIOPSY Right 2011   CARPAL TUNNEL RELEASE  2000   right   CARPAL TUNNEL RELEASE  2012   left   CHOLECYSTECTOMY N/A 08/24/2018   Procedure: LAPAROSCOPIC CHOLECYSTECTOMY WITH  INTRAOPERATIVE CHOLANGIOGRAM;  Surgeon: Gail Favorite, MD;  Location: [facility] OR;  Service: General;  Laterality: N/A;   COLONOSCOPY     CYSTOCELE REPAIR  2007   sling   DISTAL INTERPHALANGEAL JOINT FUSION Right 02/04/2014   Procedure: FUSION DISTAL INTERPHALANGEAL JOINT RIGHT INDEX FINGER ;  Surgeon: Arley Curia, MD;  Location: [facility];  Service: Orthopedics;  Laterality: Right;   EYE SURGERY Bilateral    Cataract surgery with lens implant   KNEE SURGERY  2009,2011   partial knee   LAPAROSCOPY N/A 08/24/2018   Procedure: LAPAROSCOPY DIAGNOSTIC;  Surgeon: Gail Favorite, MD;  Location: [facility] OR;  Service: General;  Laterality: N/A;   LUMBAR PERCUTANEOUS PEDICLE SCREW 4 LEVEL N/A 10/27/2020   Procedure: Thoracic nine-ten Laminectomy with extension of fusion from Thoracic ten to Thoracic six with segmental fixation (cemented) with pedicle screw and mazor;  Surgeon: Colon Shove, MD;  Location: [facility] OR;  Service: Neurosurgery;  Laterality: N/A;   OPEN REDUCTION INTERNAL FIXATION (ORIF) PROXIMAL PHALANX Left 08/30/2013   Procedure: OPEN REDUCTION INTERNAL FIXATION (ORIF) PROXIMAL PHALANX FRACTURE LEFT SMALL FINGER; SPLINT RING FINGER;  Surgeon: Arley JONELLE Curia, MD;  Location: [facility];  Service: Orthopedics;  Laterality: Left;   POSTERIOR LUMBAR FUSION 4 LEVEL N/A 07/17/2017   Procedure: Thoracic Ten - Lumbar Five revison of hardware with Mazor;  Surgeon: Colon Shove, MD;  Location: [facility] OR;  Service: Neurosurgery;  Laterality: N/A;  Thoracic/Lumbar   PTOSIS REPAIR Bilateral 07/27/2015   Procedure: PTOSIS REPAIR;  Surgeon: Elna Pick, MD;  Location: [facility];  Service: Plastics;  Laterality: Bilateral;   SHOULDER SURGERY     rt rcr,and lt   TENOLYSIS Left 05/21/2021   Procedure: RELEASE EXTENSOR POLLICIS LONGUS LEFT;  Surgeon: Murrell Kuba, MD;  Location: [facility];  Service: Orthopedics;  Laterality: Left;   TENOLYSIS Left 06/08/2021    Procedure: RELEASE EXTENSOR POLLICIS LONGUS LEFT;  Surgeon: Murrell Kuba, MD;  Location: [facility];  Service: Orthopedics;  Laterality: Left;   TONSILLECTOMY     TOTAL KNEE ARTHROPLASTY Right 12/23/2019   Procedure: TOTAL KNEE ARTHROPLASTY;  Surgeon: Rubie Kemps, MD;  Location: [facility];  Service: Orthopedics;  Laterality: Right;   TRIGGER FINGER RELEASE Left 05/21/2021   Procedure: RELEASE TRIGGER FINGER/A-1 PULLEY LEFT MIDDLE FINGER;  Surgeon: Murrell Kuba, MD;  Location: [facility];  Service: Orthopedics;  Laterality: Left;   TRIGGER FINGER RELEASE Left 06/08/2021   Procedure: RELEASE A-1 PULLEY LEFT MIDDLE FINGER;  Surgeon: Murrell Kuba, MD;  Location: [facility];  Service: Orthopedics;  Laterality: Left;   Patient Active Problem List   Diagnosis Date Noted   Myelopathy concurrent with and due to spinal stenosis of thoracic region (HCC) 10/27/2020   S/P total knee arthroplasty, right 12/23/2019   Acalculous cholecystitis 08/24/2018   Osteoarthritis of right knee    Vertigo    DDD (degenerative disc disease), cervical    Benign essential HTN    History of DVT (deep vein thrombosis)    Tachycardia    Steroid-induced hyperglycemia    Hypothyroidism    Neuropathic pain    Acute blood loss anemia    S/P lumbar fusion    Closed L5 vertebral fracture (HCC) 08/31/2017   Closed fracture of fifth lumbar vertebra (HCC) 08/30/2017   Lumbar vertebral fracture (HCC) 07/17/2017   Scoliosis 06/13/2017    PCP: Tanda Rummer   REFERRING PROVIDER: Kemps Rubie  REFERRING DIAG:  [phone] (ICD-10-CM) - Left knee pain      THERAPY DIAG:  Radiculopathy, lumbosacral region  Muscle weakness (generalized)  Difficulty in walking, not elsewhere classified  Other low back pain  Other abnormalities of gait and mobility  Unsteady gait  Stiffness of left knee, not elsewhere classified  Acute pain of left knee  Muscle spasm of back  Rationale  for Evaluation and Treatment: Rehabilitation  ONSET DATE: 10/17/22  SUBJECTIVE:   SUBJECTIVE STATEMENT: Leg just hurts, see the MD on August 6th     Patient reports that she has a pinched nerve in the left leg, sees Dr. Colon 6/25  PERTINENT HISTORY: See above   PAIN:  Are you having pain? Yes: NPRS scale: 0/10 Pain location: L leg Pain description: dull, weakness Aggravating factors: laying in bed, standing for too long, stairs Relieving factors: tylenol    PRECAUTIONS: Fall  RED FLAGS: None   WEIGHT BEARING RESTRICTIONS: No  FALLS:  Has patient fallen in last 6 months? Yes. Number of falls 1, just lost my balance and my knee buckled   LIVING ENVIRONMENT: Lives with: lives with their spouse Lives in: House/apartment Stairs: Yes: Internal: 12 steps; can reach both Has following equipment at home: Vannie - 2 wheeled and Shower bench  OCCUPATION: N/A  PLOF: Independent with basic ADLs and Independent with household mobility with device  PATIENT  GOALS: to get back on my feet and get back on my cane   NEXT MD VISIT: middle of June  OBJECTIVE:  Note: Objective measures were completed at Evaluation unless otherwise noted.  DIAGNOSTIC FINDINGS: X-rays are updated and independent reviewed and shows status post total knee arthroplasty hardware well aligned. She does have what appears to be a valgus deformity of the left knee however the x-rays look okay.   COGNITION: Overall cognitive status: Within functional limits for tasks assessed     SENSATION: WFL   MUSCLE LENGTH: Hamstrings: tight in BLE, more tight in LLE  POSTURE: rounded shoulders, forward head, increased thoracic kyphosis, and flexed trunk   PALPATION: Tender in L glute and down the side of leg   LOWER EXTREMITY ROM:  Active ROM Left eval Left 09/14/23  Hip flexion    Hip extension    Hip abduction    Hip adduction    Hip internal rotation    Hip external rotation    Knee flexion 120 118   Knee extension -15 7  Ankle dorsiflexion    Ankle plantarflexion    Ankle inversion    Ankle eversion     (Blank rows = not tested)  LOWER EXTREMITY MMT:  MMT Right eval Left eval Left 09/14/23  Hip flexion 5 4- 4  Hip extension     Hip abduction     Hip adduction     Hip internal rotation     Hip external rotation     Knee flexion 5 4- 4  Knee extension 5 4- 4  Ankle dorsiflexion     Ankle plantarflexion     Ankle inversion     Ankle eversion      (Blank rows = not tested)  LOWER EXTREMITY SPECIAL TESTS:  Positive slump test  LUMBAR ROM: is very limited especially with extension, she reports she is unable to do flexion per her doctor's orders   FUNCTIONAL TESTS:  5 times sit to stand: 29s needing UE to push off  Timed up and go (TUG): 22s   GAIT: Distance walked: in clinic distances Assistive device utilized: Environmental consultant - 2 wheeled Level of assistance: Complete Independence Comments: using RW, decreased knee ext on L knee                                                                                                                                TREATMENT DATE:  10/26/23 Leg curls 35# Leg extension 10# Nustep level 5 x 7 minutes Standing 10# straight arm pulls Seated rows 20# Lats 20# 5# AR press struggled when the left side was toward the weight Black tband lumbar extension 2.5# marches 2.5# hip abduction 2.5# hip extension Sit to stand 2x5 with weighted ball  10/24/23 Nustep level 5 x 8 minutes Leg extension 10# 3x10 Leg curls 25# All gait in the clinic with two SPC's 10# straight arm pulls with cues for posture and  core activation Seated rows 20# 3x10 LAts 20# 3x10 Black tband lumbar extension 2.5# marches with walker 2.5# hip abduction 2.5# hip extension Feet on ball K2C, rotation, bridge, isometric abs Green tband clamshells Ball b/n knees squeeze  10/11/23 Recert TUG- 17.11s 5xSTS- 17.30s NuStep L5 x83mins Leg ext 5# 2x12 HS curls 25#  2x12 Calf stretch on slant  Calf raises 2x10 Passive LE stretches  SLR x10   10/04/23 Nustep level 5 x 6 minutes Leg extension 5# 2x12 HS curls 25# 2x12 Feet on ball K2C, rotation, bridge, isometric abs Ball b/n knees squeeze SAQ 3# Gentle passive stretch Used T-gun for STM to the left thigh hip flexor  09/28/23 TUG- 14.74s with walker 5xSTS- 16s NuStep L5x64mins  Leg ext 5# 2x12 HS curls 25# 2x12  Passive LE stretching- HS, SKTC, ITB, glutes Feet on pball rotations, knees to chest x10 Bridges with ball squeeze 2x5 Ball squeeze 2x10 STS 2x10  09/25/23 Passive stretch STM to the left low back, buttock Nustep level 5 x 6 minutes Leg curls 20# 2x10 5# leg extension 10# straight arm pulls Green tband clamshells Ball b/n knees squeeze Side stepping Small weighted ball STS with reach 3# marching 3# hip abduction 3# hip extension  09/21/23 Nustep level 5 x 6 minutes Leg curls 20# 2x10 Leg ext 5# 2x10 10# straight arm pulls 2x10 Bridges 2x10 Green tband clamshells 2x10 STM with the t-gun to the left buttock, ITB and HS area LE stretching HS, SKTC, LTR   09/14/23 Nustep level 5 x 6 minutes GOALS  ROM    MMT  5x sit to stand 17 sec TUG 16.40 sec HS curls 25lb 2x12 Leg Ext 10lb 2x12 Hip add ball squeeze 2x15 4in step ups with RW x10 Side steps at mat table 5# straight arm pulls  09/11/23 Nustep level 5 x 6 minutes LEg curls 20# 2x10 Leg ext 5# 2x10 5# straight arm pulls Rows 10lb 2x10 Hip add ball squeeze 2x15 Hip abd green 2x15 Standing march  Side steps at mat table  Standing march, hip abd  in RW 2lb cuff 2x10  09/06/23 Nustep level 5 x 6 minutes LEg curls 20# 2xNustep level 5 x 6 minutes10 Leg ext 5# 2x10 5# straight arm pulls Green tband rows Green tband [employer] STM with the t-gun to the left buttock, ITB and HS area   08/31/23 NuStep L5 x65mins  Rows and ext green band 2x10 STS 2x10  Horizontal abd red band 2x10 Passive LE  stretches  Bridges 2x10 SLR 2x10  08/24/23 NuStep L5x65mins  Hip abduction green 2x10 STS 2x10 2# marching 20 reps alt x2  2# hip abd and ext with RW 2x10 Passive supine LE stretches HS, LTR, SKTC, glutes, figure 4   Supine feet on pball rotations and knees to chest x10   08/21/23 NuStep L 5 x 6 min Rows green 2x10 Standing Shoulder Ext green 2x10  Hip abd green 2x10  Hip add ball squeeze 2x10  Side steps at mat HHA x2 then x1 Standing hip Abd w/ RW 2lb 2x10  LAQ 2lb 2x15 HS curls green 2x10   08/16/23- EVAL    PATIENT EDUCATION:  Education details: POC, anatomy education  Person educated: Patient Education method: Explanation Education comprehension: verbalized understanding  HOME EXERCISE PROGRAM: TBD  ASSESSMENT:  CLINICAL IMPRESSION: Patient is a 89 y.o. female who was seen today for physical therapy treatment for LBP and L leg weakness. She continues to have the pain in her L side.  She is now scheduled for a CT scan on August 6th.  She had difficulty with AR press when the left side was toward the weight, she has difficulty with doing 10 reps of hip abduction on the left, and really struggled getting up from sitting.   OBJECTIVE IMPAIRMENTS: Abnormal gait, decreased activity tolerance, decreased balance, difficulty walking, decreased ROM, decreased strength, impaired flexibility, improper body mechanics, postural dysfunction, and pain.   ACTIVITY LIMITATIONS: carrying, lifting, bending, squatting, stairs, and locomotion level  PARTICIPATION LIMITATIONS: shopping and yard work  PERSONAL FACTORS: Age, Past/current experiences, and Time since onset of injury/illness/exacerbation are also affecting patient's functional outcome.   REHAB POTENTIAL: Good  CLINICAL DECISION MAKING: Stable/uncomplicated  EVALUATION COMPLEXITY: Low   GOALS: Goals reviewed with patient? Yes  SHORT TERM GOALS: Target date: 09/27/23  Patient will be independent with initial HEP.   Baseline:  Goal status: met 10/04/23  2.  Patient will complete TUG with rolling walker <15s  Baseline: 22s Goal status: Progressing 09/14/23, 14.74s MET 09/28/23   LONG TERM GOALS: Target date: 11/22/23  Patient will be independent with advanced/ongoing HEP to improve outcomes and carryover.  Baseline:  Goal status: progressing 10/24/23  2.  Patient will report 50-75% improvement in low back pain to improve QOL. (<2/10) Baseline: 4/10 Goal status: IN PROGRESS no pain in back but pressure in the SIJ and pain in LLE 10/04/23  3.  Patient will report centralization of radicular symptoms.  Baseline: radicular sx in LLE Goal status: Ongoing 09/14/23, still has pain in L leg 09/28/23, progressing 10/24/23  4.  Patient will demonstrate improved functional strength as demonstrated by 5xSTS <15s without UE use. Baseline: 29s using chair to push up Goal status: Progressing 09/14/23, 16s uses legs against mat 2x 09/28/23, 17s 10/11/23  5.  Patient will complete TUG <20s with SPC Baseline: 22s with walker  Goal status: progressing 09/25/23, 17s 10/11/23 MEt  6.  Patient will be able to walk at least 319ft with [facility]  Baseline: not using cane unless short distances in home Goal status: progressing 250 feet 10/26/23  PLAN:  PT FREQUENCY: 2x/week  PT DURATION: 12 weeks  PLANNED INTERVENTIONS: 97110-Therapeutic exercises, 97530- Therapeutic activity, 97112- Neuromuscular re-education, 97535- Self Care, [id number]- Manual therapy, [phone]- Gait training, [phone]- Electrical stimulation (unattended), [phone]- Traction (mechanical), Patient/Family education, Balance training, Stair training, Taping, Dry Needling, Joint mobilization, Spinal mobilization, Cryotherapy, and Moist heat  PLAN FOR NEXT SESSION: insurance authorized 4 more visits today is visit 2 of 4   Aariah Godette W, PT 10/26/2023, 1:09 PM

## 2023-10-31 ENCOUNTER — Ambulatory Visit

## 2023-11-02 ENCOUNTER — Ambulatory Visit: Admitting: Physical Therapy

## 2023-11-02 ENCOUNTER — Encounter: Payer: Self-pay | Admitting: Physical Therapy

## 2023-11-02 DIAGNOSIS — R262 Difficulty in walking, not elsewhere classified: Secondary | ICD-10-CM

## 2023-11-02 DIAGNOSIS — M62830 Muscle spasm of back: Secondary | ICD-10-CM

## 2023-11-02 DIAGNOSIS — M5459 Other low back pain: Secondary | ICD-10-CM

## 2023-11-02 DIAGNOSIS — M5417 Radiculopathy, lumbosacral region: Secondary | ICD-10-CM

## 2023-11-02 DIAGNOSIS — R2689 Other abnormalities of gait and mobility: Secondary | ICD-10-CM

## 2023-11-02 DIAGNOSIS — M25662 Stiffness of left knee, not elsewhere classified: Secondary | ICD-10-CM

## 2023-11-02 DIAGNOSIS — R2681 Unsteadiness on feet: Secondary | ICD-10-CM

## 2023-11-02 DIAGNOSIS — M6281 Muscle weakness (generalized): Secondary | ICD-10-CM

## 2023-11-02 DIAGNOSIS — M25562 Pain in left knee: Secondary | ICD-10-CM

## 2023-11-02 NOTE — Therapy (Signed)
 OUTPATIENT PHYSICAL THERAPY LOWER EXTREMITY TREATMENT    Patient Name: [name] MRN: [id number] DOB:[birth year], 89 y.o., female Today's Date: 11/02/2023  END OF SESSION:  PT End of Session - 11/02/23 1259     Visit Number 15    Date for PT Re-Evaluation 11/09/23    Authorization Type [employer] 3 of 4    PT Start Time 1301    PT Stop Time 1350    PT Time Calculation (min) 49 min    Activity Tolerance Patient tolerated treatment well    Behavior During Therapy WFL for tasks assessed/performed               Past Medical History:  Diagnosis Date   Acalculous cholecystitis 08/24/2018   Arthritis    DVT of lower extremity (deep venous thrombosis) (HCC)    right leg - after back surgery in 2019   GERD (gastroesophageal reflux disease)    History of blood transfusion    Hypothyroidism    PONV (postoperative nausea and vomiting)    Ptosis of both eyelids    Vertigo    Wears glasses    reading   Past Surgical History:  Procedure Laterality Date   ABDOMINAL HYSTERECTOMY  1986   APPLICATION OF ROBOTIC ASSISTANCE FOR SPINAL PROCEDURE N/A 07/17/2017   Procedure: APPLICATION OF ROBOTIC ASSISTANCE FOR SPINAL PROCEDURE;  Surgeon: Colon Shove, MD;  Location: [facility] OR;  Service: Neurosurgery;  Laterality: N/A;   APPLICATION OF ROBOTIC ASSISTANCE FOR SPINAL PROCEDURE N/A 09/01/2017   Procedure: APPLICATION OF ROBOTIC ASSISTANCE FOR SPINAL PROCEDURE;  Surgeon: Colon Shove, MD;  Location: [facility] OR;  Service: Neurosurgery;  Laterality: N/A;   APPLICATION OF ROBOTIC ASSISTANCE FOR SPINAL PROCEDURE N/A 10/27/2020   Procedure: APPLICATION OF ROBOTIC ASSISTANCE FOR SPINAL PROCEDURE;  Surgeon: Colon Shove, MD;  Location: [facility] OR;  Service: Neurosurgery;  Laterality: N/A;   BACK SURGERY  1986   lumb lam   BREAST BIOPSY Right 2011   CARPAL TUNNEL RELEASE  2000   right   CARPAL TUNNEL RELEASE  2012   left   CHOLECYSTECTOMY N/A 08/24/2018   Procedure: LAPAROSCOPIC CHOLECYSTECTOMY WITH  INTRAOPERATIVE CHOLANGIOGRAM;  Surgeon: Gail Favorite, MD;  Location: [facility] OR;  Service: General;  Laterality: N/A;   COLONOSCOPY     CYSTOCELE REPAIR  2007   sling   DISTAL INTERPHALANGEAL JOINT FUSION Right 02/04/2014   Procedure: FUSION DISTAL INTERPHALANGEAL JOINT RIGHT INDEX FINGER ;  Surgeon: Arley Curia, MD;  Location: [facility];  Service: Orthopedics;  Laterality: Right;   EYE SURGERY Bilateral    Cataract surgery with lens implant   KNEE SURGERY  2009,2011   partial knee   LAPAROSCOPY N/A 08/24/2018   Procedure: LAPAROSCOPY DIAGNOSTIC;  Surgeon: Gail Favorite, MD;  Location: [facility] OR;  Service: General;  Laterality: N/A;   LUMBAR PERCUTANEOUS PEDICLE SCREW 4 LEVEL N/A 10/27/2020   Procedure: Thoracic nine-ten Laminectomy with extension of fusion from Thoracic ten to Thoracic six with segmental fixation (cemented) with pedicle screw and mazor;  Surgeon: Colon Shove, MD;  Location: [facility] OR;  Service: Neurosurgery;  Laterality: N/A;   OPEN REDUCTION INTERNAL FIXATION (ORIF) PROXIMAL PHALANX Left 08/30/2013   Procedure: OPEN REDUCTION INTERNAL FIXATION (ORIF) PROXIMAL PHALANX FRACTURE LEFT SMALL FINGER; SPLINT RING FINGER;  Surgeon: Arley JONELLE Curia, MD;  Location:  SURGERY CENTER;  Service: Orthopedics;  Laterality: Left;   POSTERIOR LUMBAR FUSION 4 LEVEL N/A 07/17/2017   Procedure: Thoracic Ten - Lumbar Five revison of hardware with Mazor;  Surgeon: Colon Shove, MD;  Location: [facility] OR;  Service: Neurosurgery;  Laterality: N/A;  Thoracic/Lumbar   PTOSIS REPAIR Bilateral 07/27/2015   Procedure: PTOSIS REPAIR;  Surgeon: Elna Pick, MD;  Location: [facility];  Service: Plastics;  Laterality: Bilateral;   SHOULDER SURGERY     rt rcr,and lt   TENOLYSIS Left 05/21/2021   Procedure: RELEASE EXTENSOR POLLICIS LONGUS LEFT;  Surgeon: Murrell Kuba, MD;  Location: [facility];  Service: Orthopedics;  Laterality: Left;   TENOLYSIS Left 06/08/2021    Procedure: RELEASE EXTENSOR POLLICIS LONGUS LEFT;  Surgeon: Murrell Kuba, MD;  Location: [facility];  Service: Orthopedics;  Laterality: Left;   TONSILLECTOMY     TOTAL KNEE ARTHROPLASTY Right 12/23/2019   Procedure: TOTAL KNEE ARTHROPLASTY;  Surgeon: Rubie Kemps, MD;  Location: [facility]S;  Service: Orthopedics;  Laterality: Right;   TRIGGER FINGER RELEASE Left 05/21/2021   Procedure: RELEASE TRIGGER FINGER/A-1 PULLEY LEFT MIDDLE FINGER;  Surgeon: Murrell Kuba, MD;  Location: [facility];  Service: Orthopedics;  Laterality: Left;   TRIGGER FINGER RELEASE Left 06/08/2021   Procedure: RELEASE A-1 PULLEY LEFT MIDDLE FINGER;  Surgeon: Murrell Kuba, MD;  Location:  SURGERY CENTER;  Service: Orthopedics;  Laterality: Left;   Patient Active Problem List   Diagnosis Date Noted   Myelopathy concurrent with and due to spinal stenosis of thoracic region (HCC) 10/27/2020   S/P total knee arthroplasty, right 12/23/2019   Acalculous cholecystitis 08/24/2018   Osteoarthritis of right knee    Vertigo    DDD (degenerative disc disease), cervical    Benign essential HTN    History of DVT (deep vein thrombosis)    Tachycardia    Steroid-induced hyperglycemia    Hypothyroidism    Neuropathic pain    Acute blood loss anemia    S/P lumbar fusion    Closed L5 vertebral fracture (HCC) 08/31/2017   Closed fracture of fifth lumbar vertebra (HCC) 08/30/2017   Lumbar vertebral fracture (HCC) 07/17/2017   Scoliosis 06/13/2017    PCP: Tanda Rummer   REFERRING PROVIDER: Kemps Rubie  REFERRING DIAG:  [phone] (ICD-10-CM) - Left knee pain      THERAPY DIAG:  Radiculopathy, lumbosacral region  Muscle weakness (generalized)  Difficulty in walking, not elsewhere classified  Other low back pain  Other abnormalities of gait and mobility  Unsteady gait  Stiffness of left knee, not elsewhere classified  Acute pain of left knee  Muscle spasm of back  Rationale  for Evaluation and Treatment: Rehabilitation  ONSET DATE: 10/17/22  SUBJECTIVE:   SUBJECTIVE STATEMENT: Patient reports very tired due to running around all morning    Patient reports that she has a pinched nerve in the left leg, sees Dr. Colon 11/08/23  PERTINENT HISTORY: See above   PAIN:  Are you having pain? Yes: NPRS scale: 0/10 Pain location: L leg Pain description: dull, weakness Aggravating factors: laying in bed, standing for too long, stairs Relieving factors: tylenol    PRECAUTIONS: Fall  RED FLAGS: None   WEIGHT BEARING RESTRICTIONS: No  FALLS:  Has patient fallen in last 6 months? Yes. Number of falls 1, just lost my balance and my knee buckled   LIVING ENVIRONMENT: Lives with: lives with their spouse Lives in: House/apartment Stairs: Yes: Internal: 12 steps; can reach both Has following equipment at home: Vannie - 2 wheeled and Shower bench  OCCUPATION: N/A  PLOF: Independent with basic ADLs and Independent with household mobility with device  PATIENT  GOALS: to get back on my feet and get back on my cane   NEXT MD VISIT: middle of June  OBJECTIVE:  Note: Objective measures were completed at Evaluation unless otherwise noted.  DIAGNOSTIC FINDINGS: X-rays are updated and independent reviewed and shows status post total knee arthroplasty hardware well aligned. She does have what appears to be a valgus deformity of the left knee however the x-rays look okay.   COGNITION: Overall cognitive status: Within functional limits for tasks assessed     SENSATION: WFL   MUSCLE LENGTH: Hamstrings: tight in BLE, more tight in LLE  POSTURE: rounded shoulders, forward head, increased thoracic kyphosis, and flexed trunk   PALPATION: Tender in L glute and down the side of leg   LOWER EXTREMITY ROM:  Active ROM Left eval Left 09/14/23  Hip flexion    Hip extension    Hip abduction    Hip adduction    Hip internal rotation    Hip external rotation     Knee flexion 120 118  Knee extension -15 7  Ankle dorsiflexion    Ankle plantarflexion    Ankle inversion    Ankle eversion     (Blank rows = not tested)  LOWER EXTREMITY MMT:  MMT Right eval Left eval Left 09/14/23  Hip flexion 5 4- 4  Hip extension     Hip abduction     Hip adduction     Hip internal rotation     Hip external rotation     Knee flexion 5 4- 4  Knee extension 5 4- 4  Ankle dorsiflexion     Ankle plantarflexion     Ankle inversion     Ankle eversion      (Blank rows = not tested)  LOWER EXTREMITY SPECIAL TESTS:  Positive slump test  LUMBAR ROM: is very limited especially with extension, she reports she is unable to do flexion per her doctor's orders   FUNCTIONAL TESTS:  5 times sit to stand: 29s needing UE to push off  Timed up and go (TUG): 22s   GAIT: Distance walked: in clinic distances Assistive device utilized: Environmental consultant - 2 wheeled Level of assistance: Complete Independence Comments: using RW, decreased knee ext on L knee                                                                                                                                TREATMENT DATE:  11/02/23 Nustep level 5 x 6 minutes LEg curls 35# 2x15 Leg extension 10# 2x10 Seated rows 20# Lats 20# Black tband extension TUG with 2 canes 21 s, TUG with 2 canes 21 s, TUG with FWW 16 seconds Triceps 20# Biceps 5# Ball b/n knees  Green tband clamshells Green tband left ankle PF/DF Right ankle green tband PF, yellow tband DF  10/26/23 Leg curls 35# Leg extension 10# Nustep level 5 x 7 minutes Standing 10# straight arm pulls Seated rows 20#  Lats 20# 5# AR press struggled when the left side was toward the weight Black tband lumbar extension 2.5# marches 2.5# hip abduction 2.5# hip extension Sit to stand 2x5 with weighted ball  10/24/23 Nustep level 5 x 8 minutes Leg extension 10# 3x10 Leg curls 25# All gait in the clinic with two SPC's 10# straight arm pulls with  cues for posture and core activation Seated rows 20# 3x10 LAts 20# 3x10 Black tband lumbar extension 2.5# marches with walker 2.5# hip abduction 2.5# hip extension Feet on ball K2C, rotation, bridge, isometric abs Green tband clamshells Ball b/n knees squeeze  10/11/23 Recert TUG- 17.11s 5xSTS- 17.30s NuStep L5 x52mins Leg ext 5# 2x12 HS curls 25# 2x12 Calf stretch on slant  Calf raises 2x10 Passive LE stretches  SLR x10   10/04/23 Nustep level 5 x 6 minutes Leg extension 5# 2x12 HS curls 25# 2x12 Feet on ball K2C, rotation, bridge, isometric abs Ball b/n knees squeeze SAQ 3# Gentle passive stretch Used T-gun for STM to the left thigh hip flexor  09/28/23 TUG- 14.74s with walker 5xSTS- 16s NuStep L5x35mins  Leg ext 5# 2x12 HS curls 25# 2x12  Passive LE stretching- HS, SKTC, ITB, glutes Feet on pball rotations, knees to chest x10 Bridges with ball squeeze 2x5 Ball squeeze 2x10 STS 2x10  09/25/23 Passive stretch STM to the left low back, buttock Nustep level 5 x 6 minutes Leg curls 20# 2x10 5# leg extension 10# straight arm pulls Green tband clamshells Ball b/n knees squeeze Side stepping Small weighted ball STS with reach 3# marching 3# hip abduction 3# hip extension  09/21/23 Nustep level 5 x 6 minutes Leg curls 20# 2x10 Leg ext 5# 2x10 10# straight arm pulls 2x10 Bridges 2x10 Green tband clamshells 2x10 STM with the t-gun to the left buttock, ITB and HS area LE stretching HS, SKTC, LTR   09/14/23 Nustep level 5 x 6 minutes GOALS  ROM    MMT  5x sit to stand 17 sec TUG 16.40 sec HS curls 25lb 2x12 Leg Ext 10lb 2x12 Hip add ball squeeze 2x15 4in step ups with RW x10 Side steps at mat table 5# straight arm pulls  09/11/23 Nustep level 5 x 6 minutes LEg curls 20# 2x10 Leg ext 5# 2x10 5# straight arm pulls Rows 10lb 2x10 Hip add ball squeeze 2x15 Hip abd green 2x15 Standing march  Side steps at mat table  Standing march, hip abd  in RW  2lb cuff 2x10  09/06/23 Nustep level 5 x 6 minutes LEg curls 20# 2xNustep level 5 x 6 minutes10 Leg ext 5# 2x10 5# straight arm pulls Green tband rows Green tband [employer] STM with the t-gun to the left buttock, ITB and HS area   08/31/23 NuStep L5 x75mins  Rows and ext green band 2x10 STS 2x10  Horizontal abd red band 2x10 Passive LE stretches  Bridges 2x10 SLR 2x10  08/24/23 NuStep L5x13mins  Hip abduction green 2x10 STS 2x10 2# marching 20 reps alt x2  2# hip abd and ext with RW 2x10 Passive supine LE stretches HS, LTR, SKTC, glutes, figure 4   Supine feet on pball rotations and knees to chest x10   08/21/23 NuStep L 5 x 6 min Rows green 2x10 Standing Shoulder Ext green 2x10  Hip abd green 2x10  Hip add ball squeeze 2x10  Side steps at mat HHA x2 then x1 Standing hip Abd w/ RW 2lb 2x10  LAQ 2lb 2x15 HS curls green 2x10  08/16/23- EVAL    PATIENT EDUCATION:  Education details: POC, anatomy education  Person educated: Patient Education method: Explanation Education comprehension: verbalized understanding  HOME EXERCISE PROGRAM: TBD  ASSESSMENT:  CLINICAL IMPRESSION: Patient is a 89 y.o. female who was seen today for physical therapy treatment for LBP and L leg weakness. She continues to have the pain in her L side. She is now scheduled for a CT scan on August 6th.  I tested her TUG and with the walker she improved by 6 seconds, she can use a SPC and is 1 second faster than the original when she used the walker.  She has improved in her mobility and function but still with pain and some LE weakness   OBJECTIVE IMPAIRMENTS: Abnormal gait, decreased activity tolerance, decreased balance, difficulty walking, decreased ROM, decreased strength, impaired flexibility, improper body mechanics, postural dysfunction, and pain.   ACTIVITY LIMITATIONS: carrying, lifting, bending, squatting, stairs, and locomotion level  PARTICIPATION LIMITATIONS: shopping and  yard work  PERSONAL FACTORS: Age, Past/current experiences, and Time since onset of injury/illness/exacerbation are also affecting patient's functional outcome.   REHAB POTENTIAL: Good  CLINICAL DECISION MAKING: Stable/uncomplicated  EVALUATION COMPLEXITY: Low   GOALS: Goals reviewed with patient? Yes  SHORT TERM GOALS: Target date: 09/27/23  Patient will be independent with initial HEP.  Baseline:  Goal status: met 10/04/23  2.  Patient will complete TUG with rolling walker <15s  Baseline: 22s Goal status: Progressing 09/14/23, 14.74s MET 09/28/23   LONG TERM GOALS: Target date: 11/22/23  Patient will be independent with advanced/ongoing HEP to improve outcomes and carryover.  Baseline:  Goal status: progressing 10/24/23  2.  Patient will report 50-75% improvement in low back pain to improve QOL. (<2/10) Baseline: 4/10 Goal status: IN PROGRESS no pain in back but pressure in the SIJ and pain in LLE 10/04/23  3.  Patient will report centralization of radicular symptoms.  Baseline: radicular sx in LLE Goal status: Ongoing 09/14/23, still has pain in L leg 09/28/23, progressing 10/24/23  4.  Patient will demonstrate improved functional strength as demonstrated by 5xSTS <15s without UE use. Baseline: 29s using chair to push up Goal status: Progressing 09/14/23, 16s uses legs against mat 2x 09/28/23, 17s 10/11/23  5.  Patient will complete TUG <20s with SPC Baseline: 22s with walker  Goal status: progressing 09/25/23, 17s 10/11/23 MEt  6.  Patient will be able to walk at least 358ft with [facility]  Baseline: not using cane unless short distances in home Goal status: progressing 250 feet 10/26/23  PLAN:  PT FREQUENCY: 2x/week  PT DURATION: 12 weeks  PLANNED INTERVENTIONS: 97110-Therapeutic exercises, 97530- Therapeutic activity, 97112- Neuromuscular re-education, 97535- Self Care, [id number]- Manual therapy, [phone]- Gait training, [phone]- Electrical stimulation (unattended), [phone]- Traction  (mechanical), Patient/Family education, Balance training, Stair training, Taping, Dry Needling, Joint mobilization, Spinal mobilization, Cryotherapy, and Moist heat  PLAN FOR NEXT SESSION: insurance authorized 4 more visits today is visit 3 of 4   Mckyle Solanki W, PT 11/02/2023, 1:00 PM

## 2023-11-07 ENCOUNTER — Encounter: Admitting: Physical Therapy

## 2023-11-08 ENCOUNTER — Other Ambulatory Visit: Payer: Self-pay

## 2023-11-08 ENCOUNTER — Ambulatory Visit (HOSPITAL_COMMUNITY)
Admission: RE | Admit: 2023-11-08 | Discharge: 2023-11-08 | Disposition: A | Discharge status: Home / Self Care | Source: Ambulatory Visit | Attending: Neurological Surgery | Admitting: Neurological Surgery

## 2023-11-08 ENCOUNTER — Other Ambulatory Visit (HOSPITAL_COMMUNITY): Payer: Self-pay | Admitting: Neurological Surgery

## 2023-11-08 DIAGNOSIS — M4712 Other spondylosis with myelopathy, cervical region: Secondary | ICD-10-CM | POA: Insufficient documentation

## 2023-11-08 DIAGNOSIS — M2578 Osteophyte, vertebrae: Secondary | ICD-10-CM | POA: Insufficient documentation

## 2023-11-08 DIAGNOSIS — Z981 Arthrodesis status: Secondary | ICD-10-CM | POA: Diagnosis not present

## 2023-11-08 DIAGNOSIS — M47816 Spondylosis without myelopathy or radiculopathy, lumbar region: Secondary | ICD-10-CM

## 2023-11-08 DIAGNOSIS — M4312 Spondylolisthesis, cervical region: Secondary | ICD-10-CM | POA: Diagnosis not present

## 2023-11-08 DIAGNOSIS — M4802 Spinal stenosis, cervical region: Secondary | ICD-10-CM | POA: Insufficient documentation

## 2023-11-08 DIAGNOSIS — R200 Anesthesia of skin: Secondary | ICD-10-CM | POA: Diagnosis not present

## 2023-11-08 DIAGNOSIS — M4800 Spinal stenosis, site unspecified: Secondary | ICD-10-CM | POA: Diagnosis not present

## 2023-11-08 DIAGNOSIS — M4714 Other spondylosis with myelopathy, thoracic region: Secondary | ICD-10-CM | POA: Insufficient documentation

## 2023-11-08 DIAGNOSIS — M79605 Pain in left leg: Secondary | ICD-10-CM | POA: Diagnosis not present

## 2023-11-08 MED ORDER — IOHEXOL 300 MG/ML  SOLN: 20.0000 mL | Freq: Once | INTRAMUSCULAR | Status: DC | PRN

## 2023-11-08 MED ORDER — DIAZEPAM 5 MG PO TABS
10.0000 mg | ORAL_TABLET | Freq: Once | ORAL | Status: AC
  Administered 2023-11-08: 10 mg via ORAL

## 2023-11-08 MED ORDER — IOHEXOL 180 MG/ML  SOLN
12.0000 mL | Freq: Once | INTRAMUSCULAR | Status: AC | PRN
  Administered 2023-11-08: 12 mL via INTRATHECAL

## 2023-11-08 MED ORDER — ONDANSETRON HCL 4 MG/2ML IJ SOLN: 4.0000 mg | Freq: Four times a day (QID) | INTRAMUSCULAR | Status: DC | PRN

## 2023-11-08 MED ORDER — LIDOCAINE HCL (PF) 1 % IJ SOLN
5.0000 mL | Freq: Once | INTRAMUSCULAR | Status: AC
  Administered 2023-11-08: 3 mL via INTRADERMAL

## 2023-11-08 MED ORDER — DIAZEPAM 5 MG PO TABS
ORAL_TABLET | ORAL | Status: AC
  Filled 2023-11-08: qty 2

## 2023-11-08 NOTE — Progress Notes (Signed)
 Pt ambulated to the bathroom with nurse and son Alm assistance. Due to baseline weakness pt used a wheelchair back to the room. Discharge instructions reviewed with patient and son Alm denies questions or concerns at this time.

## 2023-11-08 NOTE — Discharge Instructions (Signed)
 Myelogram and Lumbar Puncture Discharge Instructions  Go home and rest quietly for the next 24 hours.  It is important to lie flat for the next 24 hours.  Get up only to go to the restroom.  You may lie in the bed or on a couch on your back, your stomach, your left side or your right side.  You may have one pillow under your head.  You may have pillows between your knees while you are on your side or under your knees while you are on your back.  DO NOT drive today.  Recline the seat as far back as it will go, while still wearing your seat belt, on the way home.  You may get up to go to the bathroom as needed.  You may sit up for 10 minutes to eat.  You may resume your normal diet and medications unless otherwise indicated.  The incidence of headache, nausea, or vomiting is about 5% (one in 20 patients).  If you develop a headache, lie flat and drink plenty of fluids until the headache goes away.  Caffeinated beverages may be helpful.  If you develop severe nausea and vomiting or a headache that does not go away with flat bed rest, call office.  You may resume normal activities after your 24 hours of bed rest is over; however, do not exert yourself strongly or do any heavy lifting tomorrow.  Call your physician for a follow-up appointment.  The results of your myelogram will be sent directly to your physician by the following day.  If you have any questions or if complications develop after you arrive home, please call the office.  Discharge instructions have been explained to the patient.  The patient, or the person responsible for the patient, fully understands these instructions.

## 2023-11-08 NOTE — Procedures (Signed)
 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  She now has an arthrodesis from T5 down to the sacrum however she has been having increasing difficulty with her gait instability on her feet she is concerned about the possibility that something further was going on either in her lumbar spine or thoracic spine as she is experiencing increasing difficulties with ambulatory status.  Because of the extent and concentration of metallic hardware in her back I advised a myelogram and postmyelogram CAT scan is a better way to evaluate her spinal canal.  She is now undergoing a total myelogram.  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  Diet becomes dilute towards the cervical region however CT scanning should be adequate to evaluate.

## 2023-11-13 ENCOUNTER — Ambulatory Visit: Admitting: Physical Therapy

## 2024-01-16 ENCOUNTER — Other Ambulatory Visit: Payer: Self-pay | Admitting: Internal Medicine

## 2024-01-16 DIAGNOSIS — R1012 Left upper quadrant pain: Secondary | ICD-10-CM

## 2024-01-25 ENCOUNTER — Other Ambulatory Visit: Payer: Self-pay | Admitting: Orthopedic Surgery

## 2024-01-30 ENCOUNTER — Ambulatory Visit: Attending: Neurological Surgery | Admitting: Physical Therapy

## 2024-01-30 ENCOUNTER — Other Ambulatory Visit: Payer: Self-pay

## 2024-01-30 ENCOUNTER — Encounter: Payer: Self-pay | Admitting: Physical Therapy

## 2024-01-30 DIAGNOSIS — R262 Difficulty in walking, not elsewhere classified: Secondary | ICD-10-CM | POA: Diagnosis present

## 2024-01-30 DIAGNOSIS — M5417 Radiculopathy, lumbosacral region: Secondary | ICD-10-CM | POA: Diagnosis present

## 2024-01-30 DIAGNOSIS — M6281 Muscle weakness (generalized): Secondary | ICD-10-CM | POA: Diagnosis present

## 2024-01-30 DIAGNOSIS — M5459 Other low back pain: Secondary | ICD-10-CM | POA: Diagnosis present

## 2024-01-30 DIAGNOSIS — R2681 Unsteadiness on feet: Secondary | ICD-10-CM | POA: Diagnosis present

## 2024-01-30 NOTE — Therapy (Signed)
 OUTPATIENT PHYSICAL THERAPY THORACOLUMBAR EVALUATION   Patient Name: [name] MRN: [id number] DOB:[birth year], 89 y.o., female Today's Date: 01/30/2024  END OF SESSION:  PT End of Session - 01/30/24 1607     Visit Number 1    Number of Visits 13    Date for Recertification  04/03/24    Authorization Type UHC    PT Start Time 1606    PT Stop Time 1656    PT Time Calculation (min) 50 min    Activity Tolerance Patient tolerated treatment well    Behavior During Therapy [facility] for tasks assessed/performed          Past Medical History:  Diagnosis Date   Acalculous cholecystitis 08/24/2018   Arthritis    DVT of lower extremity (deep venous thrombosis) (HCC)    right leg - after back surgery in 2019   GERD (gastroesophageal reflux disease)    History of blood transfusion    Hypothyroidism    PONV (postoperative nausea and vomiting)    Ptosis of both eyelids    Vertigo    Wears glasses    reading   Past Surgical History:  Procedure Laterality Date   ABDOMINAL HYSTERECTOMY  1986   APPLICATION OF ROBOTIC ASSISTANCE FOR SPINAL PROCEDURE N/A 07/17/2017   Procedure: APPLICATION OF ROBOTIC ASSISTANCE FOR SPINAL PROCEDURE;  Surgeon: Colon Shove, MD;  [facility]: [facility];  Service: Neurosurgery;  Laterality: N/A;   APPLICATION OF ROBOTIC ASSISTANCE FOR SPINAL PROCEDURE N/A 09/01/2017   Procedure: APPLICATION OF ROBOTIC ASSISTANCE FOR SPINAL PROCEDURE;  Surgeon: Colon Shove, MD;  Location: [facility] OR;  Service: Neurosurgery;  Laterality: N/A;   APPLICATION OF ROBOTIC ASSISTANCE FOR SPINAL PROCEDURE N/A 10/27/2020   Procedure: APPLICATION OF ROBOTIC ASSISTANCE FOR SPINAL PROCEDURE;  Surgeon: Colon Shove, MD;  [facility]: [facility];  Service: Neurosurgery;  Laterality: N/A;   BACK SURGERY  1986   lumb lam   BREAST BIOPSY Right 2011   CARPAL TUNNEL RELEASE  2000   right   CARPAL TUNNEL RELEASE  2012   left   CHOLECYSTECTOMY N/A 08/24/2018   Procedure: LAPAROSCOPIC CHOLECYSTECTOMY  WITH INTRAOPERATIVE CHOLANGIOGRAM;  Surgeon: Gail Favorite, MD;  Location: [facility] OR;  Service: General;  Laterality: N/A;   COLONOSCOPY     CYSTOCELE REPAIR  2007   sling   DISTAL INTERPHALANGEAL JOINT FUSION Right 02/04/2014   Procedure: FUSION DISTAL INTERPHALANGEAL JOINT RIGHT INDEX FINGER ;  Surgeon: Arley Curia, MD;  Location: [facility];  Service: Orthopedics;  Laterality: Right;   EYE SURGERY Bilateral    Cataract surgery with lens implant   KNEE SURGERY  2009,2011   partial knee   LAPAROSCOPY N/A 08/24/2018   Procedure: LAPAROSCOPY DIAGNOSTIC;  Surgeon: Gail Favorite, MD;  Location: [facility] OR;  Service: General;  Laterality: N/A;   LUMBAR PERCUTANEOUS PEDICLE SCREW 4 LEVEL N/A 10/27/2020   Procedure: Thoracic nine-ten Laminectomy with extension of fusion from Thoracic ten to Thoracic six with segmental fixation (cemented) with pedicle screw and mazor;  Surgeon: Colon Shove, MD;  Location: [facility] OR;  Service: Neurosurgery;  Laterality: N/A;   OPEN REDUCTION INTERNAL FIXATION (ORIF) PROXIMAL PHALANX Left 08/30/2013   Procedure: OPEN REDUCTION INTERNAL FIXATION (ORIF) PROXIMAL PHALANX FRACTURE LEFT SMALL FINGER; SPLINT RING FINGER;  Surgeon: Arley JONELLE Curia, MD;  Location: [facility];  Service: Orthopedics;  Laterality: Left;   POSTERIOR LUMBAR FUSION 4 LEVEL N/A 07/17/2017   Procedure: Thoracic Ten - Lumbar Five revison of hardware with Mazor;  Surgeon: Colon,  Victory, MD;  Location: [facility] OR;  Service: Neurosurgery;  Laterality: N/A;  Thoracic/Lumbar   PTOSIS REPAIR Bilateral 07/27/2015   Procedure: PTOSIS REPAIR;  Surgeon: Elna Pick, MD;  Location: [facility];  Service: Plastics;  Laterality: Bilateral;   SHOULDER SURGERY     rt rcr,and lt   TENOLYSIS Left 05/21/2021   Procedure: RELEASE EXTENSOR POLLICIS LONGUS LEFT;  Surgeon: Murrell Kuba, MD;  Location: [facility];  Service: Orthopedics;  Laterality: Left;   TENOLYSIS Left  06/08/2021   Procedure: RELEASE EXTENSOR POLLICIS LONGUS LEFT;  Surgeon: Murrell Kuba, MD;  Location: [facility];  Service: Orthopedics;  Laterality: Left;   TONSILLECTOMY     TOTAL KNEE ARTHROPLASTY Right 12/23/2019   Procedure: TOTAL KNEE ARTHROPLASTY;  Surgeon: Rubie Kemps, MD;  Location: [facility];  Service: Orthopedics;  Laterality: Right;   TRIGGER FINGER RELEASE Left 05/21/2021   Procedure: RELEASE TRIGGER FINGER/A-1 PULLEY LEFT MIDDLE FINGER;  Surgeon: Murrell Kuba, MD;  Location: [facility];  Service: Orthopedics;  Laterality: Left;   TRIGGER FINGER RELEASE Left 06/08/2021   Procedure: RELEASE A-1 PULLEY LEFT MIDDLE FINGER;  Surgeon: Murrell Kuba, MD;  Location:  SURGERY CENTER;  Service: Orthopedics;  Laterality: Left;   Patient Active Problem List   Diagnosis Date Noted   Myelopathy concurrent with and due to spinal stenosis of thoracic region (HCC) 10/27/2020   S/P total knee arthroplasty, right 12/23/2019   Acalculous cholecystitis 08/24/2018   Osteoarthritis of right knee    Vertigo    DDD (degenerative disc disease), cervical    Benign essential HTN    History of DVT (deep vein thrombosis)    Tachycardia    Steroid-induced hyperglycemia    Hypothyroidism    Neuropathic pain    Acute blood loss anemia    S/P lumbar fusion    Closed L5 vertebral fracture (HCC) 08/31/2017   Closed fracture of fifth lumbar vertebra (HCC) 08/30/2017   Lumbar vertebral fracture (HCC) 07/17/2017   Scoliosis 06/13/2017    PCP: FABIENE Rummer, MD  REFERRING PROVIDER: Colon, MD  REFERRING DIAG: back pain, neck pain  Rationale for Evaluation and Treatment: Rehabilitation  THERAPY DIAG:  Radiculopathy, lumbosacral region  Muscle weakness (generalized)  Difficulty in walking, not elsewhere classified  Other low back pain  Unsteady gait  ONSET DATE: 01/01/24  SUBJECTIVE:                                                                                                                                                                                            SUBJECTIVE STATEMENT: PAtient reports that she saw Dr.  Elsner the back surgeon, he feels like there really is not pinched nerve.  She reports some concerns of walking and getting around with some neck and headache.    PERTINENT HISTORY:  GERD, spine surgery 2019, 2022 x 2, 1986, R TKA 2021 PAIN:  Are you having pain? Yes: NPRS scale: 3/10 Pain location: low back, left buttock and leg Pain description: ache, shooting Aggravating factors: walking, stairs, pain can be 7/10 Relieving factors: rest pain can be 0/10  PRECAUTIONS: Fall  RED FLAGS: None   WEIGHT BEARING RESTRICTIONS: No  FALLS:  Has patient fallen in last 6 months? No and I just really try to be very careful  LIVING ENVIRONMENT: Lives with: lives with their family Lives in: House/apartment Stairs: Yes: Internal: 15 steps; can reach both Has following equipment at home: Single point cane, Walker - 2 wheeled, shower chair, and Grab bars  OCCUPATION: retired  PLOF: Independent, Independent with household mobility with device, Independent with community mobility with device, and able to drive, can shop and walk into the store with walker, goes up and down stairs to do the laundry, does all the cooking  PATIENT GOALS: better balance, walk better, would like to use the [facility] again  NEXT MD VISIT: none scheduled  OBJECTIVE:  Note: Objective measures were completed at Evaluation unless otherwise noted.  DIAGNOSTIC FINDINGS:  CAT scan myelogram  COGNITION: Overall cognitive status: Within functional limits for tasks assessed     SENSATION: WFL  MUSCLE LENGTH: Tight HS, piriformis and calves  POSTURE: rounded shoulders, forward head, and decreased lumbar lordosis  PALPATION: Tight in the lumbar and the left buttock  LUMBAR ROM:   AROM eval  Flexion Decreased 50%  Extension Decreased 100%  Right lateral  flexion   Left lateral flexion   Right rotation   Left rotation    (Blank rows = not tested)  LOWER EXTREMITY ROM:     Active  Right eval Left eval  Hip flexion    Hip extension    Hip abduction    Hip adduction    Hip internal rotation    Hip external rotation    Knee flexion    Knee extension    Ankle dorsiflexion 0   Ankle plantarflexion    Ankle inversion 10   Ankle eversion 0    (Blank rows = not tested)  LOWER EXTREMITY MMT:    MMT Right eval Left eval  Hip flexion 4- 4-  Hip extension 4- 3+  Hip abduction 3= 3+  Hip adduction    Hip internal rotation    Hip external rotation    Knee flexion 4 4-  Knee extension 4 4-  Ankle dorsiflexion 2   Ankle plantarflexion 3+   Ankle inversion 3+   Ankle eversion 1    (Blank rows = not tested)  FUNCTIONAL TESTS:  5 times sit to stand: 31 seconds Timed up and go (TUG): 21 seconds with FWW 2 minute walk test: 210 feet with FWW, c/o fatigue in the feet and calves Berg balance: 23/56  GAIT: Distance walked: 200 feet Assistive device utilized: Walker - 2 wheeled Level of assistance: Complete Independence Comments: slow, started to feel fatigue in the ankles and calves  TREATMENT DATE:  01/30/24 Evaluation Nustep level 5 x 6 mintues  PATIENT EDUCATION:  Education details: POC Person educated: Patient Education method: Explanation Education comprehension: verbalized understanding  HOME EXERCISE PROGRAM: TBD  ASSESSMENT:  CLINICAL IMPRESSION: Patient is a 89 y.o. female who was seen today for physical therapy evaluation and treatment for LE weakness, poor balance and some LBP.   She reports that over the past few years she has had more and more difficulty walking, she thought she had a pinched nerve but MD ruled this out.  Right ankle is very weak, left knee and hip are weak, she  is at a very high risk for falls with Berg balance test score being 23/56.  She has to hold onto things to keep her balance  OBJECTIVE IMPAIRMENTS: Abnormal gait, cardiopulmonary status limiting activity, decreased activity tolerance, decreased balance, decreased coordination, decreased endurance, decreased mobility, difficulty walking, decreased ROM, decreased strength, increased muscle spasms, impaired flexibility, improper body mechanics, postural dysfunction, and pain.   REHAB POTENTIAL: Good  CLINICAL DECISION MAKING: Stable/uncomplicated  EVALUATION COMPLEXITY: Low   GOALS: Goals reviewed with patient? Yes  SHORT TERM GOALS: Target date: 02/24/24  Independent with initial hep Baseline: Goal status: INITIAL 05/01/24 LONG TERM GOALS: Target date: 04/03/24  Independent with advanced HEP Baseline:  Goal status: INITIAL  2.  Decrease TUG time to 16 seconds Baseline:  Goal status: INITIAL  3.  Improve Berg balance score to 42/56 Baseline:  Goal status: INITIAL  4.  Be able to walk 300 feet in Baseline:  Goal status: INITIAL  5.  Walk 200 feet with SPC and SBA Baseline:  Goal status: INITIAL  PLAN:  PT FREQUENCY: 1-2x/week  PT DURATION: 12 weeks  PLANNED INTERVENTIONS: 97164- PT Re-evaluation, 97110-Therapeutic exercises, 97530- Therapeutic activity, 97112- Neuromuscular re-education, 97535- Self Care, 02859- Manual therapy, [phone]- Gait training, [phone]- Electrical stimulation (unattended), Patient/Family education, Balance training, Stair training, Taping, Joint mobilization, Cryotherapy, and Moist heat.  PLAN FOR NEXT SESSION: balance, strength, functional gait   Mikaeel Petrow W, PT 01/30/2024, 4:08 PM

## 2024-02-06 ENCOUNTER — Ambulatory Visit: Admitting: Physical Therapy

## 2024-02-13 ENCOUNTER — Ambulatory Visit: Attending: Internal Medicine | Admitting: Physical Therapy

## 2024-02-13 ENCOUNTER — Encounter: Payer: Self-pay | Admitting: Physical Therapy

## 2024-02-13 DIAGNOSIS — M6281 Muscle weakness (generalized): Secondary | ICD-10-CM | POA: Diagnosis present

## 2024-02-13 DIAGNOSIS — M5417 Radiculopathy, lumbosacral region: Secondary | ICD-10-CM | POA: Insufficient documentation

## 2024-02-13 DIAGNOSIS — R262 Difficulty in walking, not elsewhere classified: Secondary | ICD-10-CM | POA: Diagnosis present

## 2024-02-13 DIAGNOSIS — M5459 Other low back pain: Secondary | ICD-10-CM | POA: Diagnosis present

## 2024-02-13 DIAGNOSIS — R2681 Unsteadiness on feet: Secondary | ICD-10-CM | POA: Insufficient documentation

## 2024-02-13 NOTE — Therapy (Signed)
 OUTPATIENT PHYSICAL THERAPY THORACOLUMBAR TREATMENT   Patient Name: [name] MRN: [id number] DOB:Jan 05, 1933, 89 y.o., female Today's Date: 02/13/2024  END OF SESSION:  PT End of Session - 02/13/24 1605     Visit Number 2    Number of Visits 13    Date for Recertification  04/03/24    Authorization Type UHC    PT Start Time 1605    PT Stop Time 1650    PT Time Calculation (min) 45 min    Activity Tolerance Patient tolerated treatment well    Behavior During Therapy [facility] for tasks assessed/performed          Past Medical History:  Diagnosis Date   Acalculous cholecystitis 08/24/2018   Arthritis    DVT of lower extremity (deep venous thrombosis) (HCC)    right leg - after back surgery in 2019   GERD (gastroesophageal reflux disease)    History of blood transfusion    Hypothyroidism    PONV (postoperative nausea and vomiting)    Ptosis of both eyelids    Vertigo    Wears glasses    reading   Past Surgical History:  Procedure Laterality Date   ABDOMINAL HYSTERECTOMY  1986   APPLICATION OF ROBOTIC ASSISTANCE FOR SPINAL PROCEDURE N/A 07/17/2017   Procedure: APPLICATION OF ROBOTIC ASSISTANCE FOR SPINAL PROCEDURE;  Surgeon: Colon Shove, MD;  Location: [facility] OR;  Service: Neurosurgery;  Laterality: N/A;   APPLICATION OF ROBOTIC ASSISTANCE FOR SPINAL PROCEDURE N/A 09/01/2017   Procedure: APPLICATION OF ROBOTIC ASSISTANCE FOR SPINAL PROCEDURE;  Surgeon: Colon Shove, MD;  Location: [facility] OR;  Service: Neurosurgery;  Laterality: N/A;   APPLICATION OF ROBOTIC ASSISTANCE FOR SPINAL PROCEDURE N/A 10/27/2020   Procedure: APPLICATION OF ROBOTIC ASSISTANCE FOR SPINAL PROCEDURE;  Surgeon: Colon Shove, MD;  Location: [facility];  Service: Neurosurgery;  Laterality: N/A;   BACK SURGERY  1986   lumb lam   BREAST BIOPSY Right 2011   CARPAL TUNNEL RELEASE  2000   right   CARPAL TUNNEL RELEASE  2012   left   CHOLECYSTECTOMY N/A 08/24/2018   Procedure: LAPAROSCOPIC CHOLECYSTECTOMY  WITH INTRAOPERATIVE CHOLANGIOGRAM;  Surgeon: Gail Favorite, MD;  Location: [facility] OR;  Service: General;  Laterality: N/A;   COLONOSCOPY     CYSTOCELE REPAIR  2007   sling   DISTAL INTERPHALANGEAL JOINT FUSION Right 02/04/2014   Procedure: FUSION DISTAL INTERPHALANGEAL JOINT RIGHT INDEX FINGER ;  Surgeon: Arley Curia, MD;  Location: [facility];  Service: Orthopedics;  Laterality: Right;   EYE SURGERY Bilateral    Cataract surgery with lens implant   KNEE SURGERY  2009,2011   partial knee   LAPAROSCOPY N/A 08/24/2018   Procedure: LAPAROSCOPY DIAGNOSTIC;  Surgeon: Gail Favorite, MD;  Location: [facility] OR;  Service: General;  Laterality: N/A;   LUMBAR PERCUTANEOUS PEDICLE SCREW 4 LEVEL N/A 10/27/2020   Procedure: Thoracic nine-ten Laminectomy with extension of fusion from Thoracic ten to Thoracic six with segmental fixation (cemented) with pedicle screw and mazor;  Surgeon: Colon Shove, MD;  Location: [facility] OR;  Service: Neurosurgery;  Laterality: N/A;   OPEN REDUCTION INTERNAL FIXATION (ORIF) PROXIMAL PHALANX Left 08/30/2013   Procedure: OPEN REDUCTION INTERNAL FIXATION (ORIF) PROXIMAL PHALANX FRACTURE LEFT SMALL FINGER; SPLINT RING FINGER;  Surgeon: Arley JONELLE Curia, MD;  Location: [facility];  Service: Orthopedics;  Laterality: Left;   POSTERIOR LUMBAR FUSION 4 LEVEL N/A 07/17/2017   Procedure: Thoracic Ten - Lumbar Five revison of hardware with Mazor;  Surgeon: Colon,  Victory, MD;  Location: [facility] OR;  Service: Neurosurgery;  Laterality: N/A;  Thoracic/Lumbar   PTOSIS REPAIR Bilateral 07/27/2015   Procedure: PTOSIS REPAIR;  Surgeon: Elna Pick, MD;  Location: [facility];  Service: Plastics;  Laterality: Bilateral;   SHOULDER SURGERY     rt rcr,and lt   TENOLYSIS Left 05/21/2021   Procedure: RELEASE EXTENSOR POLLICIS LONGUS LEFT;  Surgeon: Murrell Kuba, MD;  Location: [facility];  Service: Orthopedics;  Laterality: Left;   TENOLYSIS Left  06/08/2021   Procedure: RELEASE EXTENSOR POLLICIS LONGUS LEFT;  Surgeon: Murrell Kuba, MD;  Location: [facility];  Service: Orthopedics;  Laterality: Left;   TONSILLECTOMY     TOTAL KNEE ARTHROPLASTY Right 12/23/2019   Procedure: TOTAL KNEE ARTHROPLASTY;  Surgeon: Rubie Kemps, MD;  Location: [facility];  Service: Orthopedics;  Laterality: Right;   TRIGGER FINGER RELEASE Left 05/21/2021   Procedure: RELEASE TRIGGER FINGER/A-1 PULLEY LEFT MIDDLE FINGER;  Surgeon: Murrell Kuba, MD;  Location: [facility];  Service: Orthopedics;  Laterality: Left;   TRIGGER FINGER RELEASE Left 06/08/2021   Procedure: RELEASE A-1 PULLEY LEFT MIDDLE FINGER;  Surgeon: Murrell Kuba, MD;  Location:  SURGERY CENTER;  Service: Orthopedics;  Laterality: Left;   Patient Active Problem List   Diagnosis Date Noted   Myelopathy concurrent with and due to spinal stenosis of thoracic region (HCC) 10/27/2020   S/P total knee arthroplasty, right 12/23/2019   Acalculous cholecystitis 08/24/2018   Osteoarthritis of right knee    Vertigo    DDD (degenerative disc disease), cervical    Benign essential HTN    History of DVT (deep vein thrombosis)    Tachycardia    Steroid-induced hyperglycemia    Hypothyroidism    Neuropathic pain    Acute blood loss anemia    S/P lumbar fusion    Closed L5 vertebral fracture (HCC) 08/31/2017   Closed fracture of fifth lumbar vertebra (HCC) 08/30/2017   Lumbar vertebral fracture (HCC) 07/17/2017   Scoliosis 06/13/2017    PCP: FABIENE Rummer, MD  REFERRING PROVIDER: Colon, MD  REFERRING DIAG: back pain, neck pain  Rationale for Evaluation and Treatment: Rehabilitation  THERAPY DIAG:  Radiculopathy, lumbosacral region  Muscle weakness (generalized)  Difficulty in walking, not elsewhere classified  Other low back pain  Unsteady gait  ONSET DATE: 01/01/24  SUBJECTIVE:                                                                                                                                                                                            SUBJECTIVE STATEMENT: Patient reports that she is doing  well, reports that she will have hand surgery in December and may have to not have PT for 2 weeks per the MD  PAtient reports that she saw Dr. Colon the back surgeon, he feels like there really is not pinched nerve.  She reports some concerns of walking and getting around with some neck and headache.    PERTINENT HISTORY:  GERD, spine surgery 2019, 2022 x 2, 1986, R TKA 2021 PAIN:  Are you having pain? Yes: NPRS scale: 0/10 Pain location: low back, left buttock and leg Pain description: ache, shooting Aggravating factors: walking, stairs, pain can be 7/10 Relieving factors: rest pain can be 0/10  PRECAUTIONS: Fall  RED FLAGS: None   WEIGHT BEARING RESTRICTIONS: No  FALLS:  Has patient fallen in last 6 months? No and I just really try to be very careful  LIVING ENVIRONMENT: Lives with: lives with their family Lives in: House/apartment Stairs: Yes: Internal: 15 steps; can reach both Has following equipment at home: Single point cane, Walker - 2 wheeled, shower chair, and Grab bars  OCCUPATION: retired  PLOF: Independent, Independent with household mobility with device, Independent with community mobility with device, and able to drive, can shop and walk into the store with walker, goes up and down stairs to do the laundry, does all the cooking  PATIENT GOALS: better balance, walk better, would like to use the [facility] again  NEXT MD VISIT: none scheduled  OBJECTIVE:  Note: Objective measures were completed at Evaluation unless otherwise noted.  DIAGNOSTIC FINDINGS:  CAT scan myelogram  COGNITION: Overall cognitive status: Within functional limits for tasks assessed     SENSATION: WFL  MUSCLE LENGTH: Tight HS, piriformis and calves  POSTURE: rounded shoulders, forward head, and decreased lumbar  lordosis  PALPATION: Tight in the lumbar and the left buttock  LUMBAR ROM:   AROM eval  Flexion Decreased 50%  Extension Decreased 100%  Right lateral flexion   Left lateral flexion   Right rotation   Left rotation    (Blank rows = not tested)  LOWER EXTREMITY ROM:     Active  Right eval Left eval  Hip flexion    Hip extension    Hip abduction    Hip adduction    Hip internal rotation    Hip external rotation    Knee flexion    Knee extension    Ankle dorsiflexion 0   Ankle plantarflexion    Ankle inversion 10   Ankle eversion 0    (Blank rows = not tested)  LOWER EXTREMITY MMT:    MMT Right eval Left eval  Hip flexion 4- 4-  Hip extension 4- 3+  Hip abduction 3= 3+  Hip adduction    Hip internal rotation    Hip external rotation    Knee flexion 4 4-  Knee extension 4 4-  Ankle dorsiflexion 2   Ankle plantarflexion 3+   Ankle inversion 3+   Ankle eversion 1    (Blank rows = not tested)  FUNCTIONAL TESTS:  5 times sit to stand: 31 seconds Timed up and go (TUG): 21 seconds with FWW 2 minute walk test: 210 feet with FWW, c/o fatigue in the feet and calves Berg balance: 23/56  GAIT: Distance walked: 200 feet Assistive device utilized: Walker - 2 wheeled Level of assistance: Complete Independence Comments: slow, started to feel fatigue in the ankles and calves  TREATMENT DATE:  02/13/24 Nustep level 5 x 6 minutes Standing red tband row Red tband  extension Ball toss and catch 20# leg curls Red tband ankle exercises Passive right ankle stretch HHA side stepping fwd and backward walking Gait with CGA and two SPC's 110 feet 3# LAQ 3# marches Standing 3# hip abduction  Sit to stand with yellow weighted ball   01/30/24 Evaluation Nustep level 5 x 6 mintues                                                                                                                                 PATIENT EDUCATION:  Education details: POC Person  educated: Patient Education method: Explanation Education comprehension: verbalized understanding  HOME EXERCISE PROGRAM: TBD  ASSESSMENT:  CLINICAL IMPRESSION: Patient tolerated exercises well today, she actually did well with balance on the airex, the past year when I saw her she really struggled with this, but today in the [place] she did well, we did some walking with 2 SPC's and she did well with this as well, no c/o pain  Patient is a 89 y.o. female who was seen today for physical therapy evaluation and treatment for LE weakness, poor balance and some LBP.   She reports that over the past few years she has had more and more difficulty walking, she thought she had a pinched nerve but MD ruled this out.  Right ankle is very weak, left knee and hip are weak, she is at a very high risk for falls with Berg balance test score being 23/56.  She has to hold onto things to keep her balance  OBJECTIVE IMPAIRMENTS: Abnormal gait, cardiopulmonary status limiting activity, decreased activity tolerance, decreased balance, decreased coordination, decreased endurance, decreased mobility, difficulty walking, decreased ROM, decreased strength, increased muscle spasms, impaired flexibility, improper body mechanics, postural dysfunction, and pain.   REHAB POTENTIAL: Good  CLINICAL DECISION MAKING: Stable/uncomplicated  EVALUATION COMPLEXITY: Low   GOALS: Goals reviewed with patient? Yes  SHORT TERM GOALS: Target date: 02/24/24  Independent with initial hep Baseline: Goal status: ongoing 02/13/24 05/01/24 LONG TERM GOALS: Target date: 04/03/24  Independent with advanced HEP Baseline:  Goal status: INITIAL  2.  Decrease TUG time to 16 seconds Baseline:  Goal status: INITIAL  3.  Improve Berg balance score to 42/56 Baseline:  Goal status: INITIAL  4.  Be able to walk 300 feet in Baseline:  Goal status: INITIAL  5.  Walk 200 feet with SPC and SBA Baseline:  Goal status:  INITIAL  PLAN:  PT FREQUENCY: 1-2x/week  PT DURATION: 12 weeks  PLANNED INTERVENTIONS: 97164- PT Re-evaluation, 97110-Therapeutic exercises, 97530- Therapeutic activity, 97112- Neuromuscular re-education, 97535- Self Care, 02859- Manual therapy, [phone]- Gait training, [phone]- Electrical stimulation (unattended), Patient/Family education, Balance training, Stair training, Taping, Joint mobilization, Cryotherapy, and Moist heat.  PLAN FOR NEXT SESSION: balance, strength, functional gait   Davinci Glotfelty W, PT 02/13/2024, 4:11 PM

## 2024-02-15 ENCOUNTER — Ambulatory Visit: Admitting: Physical Therapy

## 2024-02-15 ENCOUNTER — Encounter: Payer: Self-pay | Admitting: Physical Therapy

## 2024-02-15 DIAGNOSIS — M5417 Radiculopathy, lumbosacral region: Secondary | ICD-10-CM | POA: Diagnosis not present

## 2024-02-15 DIAGNOSIS — R262 Difficulty in walking, not elsewhere classified: Secondary | ICD-10-CM

## 2024-02-15 DIAGNOSIS — M6281 Muscle weakness (generalized): Secondary | ICD-10-CM

## 2024-02-15 NOTE — Therapy (Signed)
 OUTPATIENT PHYSICAL THERAPY THORACOLUMBAR TREATMENT   Patient Name: [name] MRN: [id number] DOB:23-Sep-1932, 89 y.o., female Today's Date: 02/15/2024  END OF SESSION:  PT End of Session - 02/15/24 1423     Visit Number 3    Date for Recertification  04/03/24    PT Start Time 1420    PT Stop Time 1505    PT Time Calculation (min) 45 min    Activity Tolerance Patient tolerated treatment well    Behavior During Therapy [facility] for tasks assessed/performed          Past Medical History:  Diagnosis Date   Acalculous cholecystitis 08/24/2018   Arthritis    DVT of lower extremity (deep venous thrombosis) (HCC)    right leg - after back surgery in 2019   GERD (gastroesophageal reflux disease)    History of blood transfusion    Hypothyroidism    PONV (postoperative nausea and vomiting)    Ptosis of both eyelids    Vertigo    Wears glasses    reading   Past Surgical History:  Procedure Laterality Date   ABDOMINAL HYSTERECTOMY  1986   APPLICATION OF ROBOTIC ASSISTANCE FOR SPINAL PROCEDURE N/A 07/17/2017   Procedure: APPLICATION OF ROBOTIC ASSISTANCE FOR SPINAL PROCEDURE;  Surgeon: Colon Shove, MD;  Location: [facility] OR;  Service: Neurosurgery;  Laterality: N/A;   APPLICATION OF ROBOTIC ASSISTANCE FOR SPINAL PROCEDURE N/A 09/01/2017   Procedure: APPLICATION OF ROBOTIC ASSISTANCE FOR SPINAL PROCEDURE;  Surgeon: Colon Shove, MD;  Location: [facility] OR;  Service: Neurosurgery;  Laterality: N/A;   APPLICATION OF ROBOTIC ASSISTANCE FOR SPINAL PROCEDURE N/A 10/27/2020   Procedure: APPLICATION OF ROBOTIC ASSISTANCE FOR SPINAL PROCEDURE;  Surgeon: Colon Shove, MD;  Location: [facility] OR;  Service: Neurosurgery;  Laterality: N/A;   BACK SURGERY  1986   lumb lam   BREAST BIOPSY Right 2011   CARPAL TUNNEL RELEASE  2000   right   CARPAL TUNNEL RELEASE  2012   left   CHOLECYSTECTOMY N/A 08/24/2018   Procedure: LAPAROSCOPIC CHOLECYSTECTOMY WITH INTRAOPERATIVE CHOLANGIOGRAM;  Surgeon: Gail Favorite, MD;  Location: [facility] OR;  Service: General;  Laterality: N/A;   COLONOSCOPY     CYSTOCELE REPAIR  2007   sling   DISTAL INTERPHALANGEAL JOINT FUSION Right 02/04/2014   Procedure: FUSION DISTAL INTERPHALANGEAL JOINT RIGHT INDEX FINGER ;  Surgeon: Arley Curia, MD;  Location: [facility];  Service: Orthopedics;  Laterality: Right;   EYE SURGERY Bilateral    Cataract surgery with lens implant   KNEE SURGERY  2009,2011   partial knee   LAPAROSCOPY N/A 08/24/2018   Procedure: LAPAROSCOPY DIAGNOSTIC;  Surgeon: Gail Favorite, MD;  Location: [facility] OR;  Service: General;  Laterality: N/A;   LUMBAR PERCUTANEOUS PEDICLE SCREW 4 LEVEL N/A 10/27/2020   Procedure: Thoracic nine-ten Laminectomy with extension of fusion from Thoracic ten to Thoracic six with segmental fixation (cemented) with pedicle screw and mazor;  Surgeon: Colon Shove, MD;  Location: [facility] OR;  Service: Neurosurgery;  Laterality: N/A;   OPEN REDUCTION INTERNAL FIXATION (ORIF) PROXIMAL PHALANX Left 08/30/2013   Procedure: OPEN REDUCTION INTERNAL FIXATION (ORIF) PROXIMAL PHALANX FRACTURE LEFT SMALL FINGER; SPLINT RING FINGER;  Surgeon: Arley JONELLE Curia, MD;  Location: [facility];  Service: Orthopedics;  Laterality: Left;   POSTERIOR LUMBAR FUSION 4 LEVEL N/A 07/17/2017   Procedure: Thoracic Ten - Lumbar Five revison of hardware with Mazor;  Surgeon: Colon Shove, MD;  Location: [facility] OR;  Service: Neurosurgery;  Laterality: N/A;  Thoracic/Lumbar   PTOSIS REPAIR Bilateral 07/27/2015   Procedure: PTOSIS REPAIR;  Surgeon: Elna Pick, MD;  Location: [facility];  Service: Plastics;  Laterality: Bilateral;   SHOULDER SURGERY     rt rcr,and lt   TENOLYSIS Left 05/21/2021   Procedure: RELEASE EXTENSOR POLLICIS LONGUS LEFT;  Surgeon: Murrell Kuba, MD;  Location: [facility];  Service: Orthopedics;  Laterality: Left;   TENOLYSIS Left 06/08/2021   Procedure: RELEASE EXTENSOR POLLICIS LONGUS  LEFT;  Surgeon: Murrell Kuba, MD;  Location: [facility];  Service: Orthopedics;  Laterality: Left;   TONSILLECTOMY     TOTAL KNEE ARTHROPLASTY Right 12/23/2019   Procedure: TOTAL KNEE ARTHROPLASTY;  Surgeon: Rubie Kemps, MD;  Location: [facility];  Service: Orthopedics;  Laterality: Right;   TRIGGER FINGER RELEASE Left 05/21/2021   Procedure: RELEASE TRIGGER FINGER/A-1 PULLEY LEFT MIDDLE FINGER;  Surgeon: Murrell Kuba, MD;  Location: [facility];  Service: Orthopedics;  Laterality: Left;   TRIGGER FINGER RELEASE Left 06/08/2021   Procedure: RELEASE A-1 PULLEY LEFT MIDDLE FINGER;  Surgeon: Murrell Kuba, MD;  Location: [facility];  Service: Orthopedics;  Laterality: Left;   Patient Active Problem List   Diagnosis Date Noted   Myelopathy concurrent with and due to spinal stenosis of thoracic region (HCC) 10/27/2020   S/P total knee arthroplasty, right 12/23/2019   Acalculous cholecystitis 08/24/2018   Osteoarthritis of right knee    Vertigo    DDD (degenerative disc disease), cervical    Benign essential HTN    History of DVT (deep vein thrombosis)    Tachycardia    Steroid-induced hyperglycemia    Hypothyroidism    Neuropathic pain    Acute blood loss anemia    S/P lumbar fusion    Closed L5 vertebral fracture (HCC) 08/31/2017   Closed fracture of fifth lumbar vertebra (HCC) 08/30/2017   Lumbar vertebral fracture (HCC) 07/17/2017   Scoliosis 06/13/2017    PCP: FABIENE Rummer, MD  REFERRING PROVIDER: Colon, MD  REFERRING DIAG: back pain, neck pain  Rationale for Evaluation and Treatment: Rehabilitation  THERAPY DIAG:  Radiculopathy, lumbosacral region  Muscle weakness (generalized)  Difficulty in walking, not elsewhere classified  ONSET DATE: 01/01/24  SUBJECTIVE:                                                                                                                                                                                            SUBJECTIVE STATEMENT: Having some numbness in the LLE today.   PAtient reports that she saw Dr. Colon the back surgeon, he feels like there really is not pinched  nerve.  She reports some concerns of walking and getting around with some neck and headache.    PERTINENT HISTORY:  GERD, spine surgery 2019, 2022 x 2, 1986, R TKA 2021 PAIN:  Are you having pain? Yes: NPRS scale: 0/10 Pain location: low back, left buttock and leg Pain description: ache, shooting Aggravating factors: walking, stairs, pain can be 7/10 Relieving factors: rest pain can be 0/10  PRECAUTIONS: Fall  RED FLAGS: None   WEIGHT BEARING RESTRICTIONS: No  FALLS:  Has patient fallen in last 6 months? No and I just really try to be very careful  LIVING ENVIRONMENT: Lives with: lives with their family Lives in: House/apartment Stairs: Yes: Internal: 15 steps; can reach both Has following equipment at home: Single point cane, Walker - 2 wheeled, shower chair, and Grab bars  OCCUPATION: retired  PLOF: Independent, Independent with household mobility with device, Independent with community mobility with device, and able to drive, can shop and walk into the store with walker, goes up and down stairs to do the laundry, does all the cooking  PATIENT GOALS: better balance, walk better, would like to use the [facility]  NEXT MD VISIT: none scheduled  OBJECTIVE:  Note: Objective measures were completed at Evaluation unless otherwise noted.  DIAGNOSTIC FINDINGS:  CAT scan myelogram  COGNITION: Overall cognitive status: Within functional limits for tasks assessed     SENSATION: WFL  MUSCLE LENGTH: Tight HS, piriformis and calves  POSTURE: rounded shoulders, forward head, and decreased lumbar lordosis  PALPATION: Tight in the lumbar and the left buttock  LUMBAR ROM:   AROM eval  Flexion Decreased 50%  Extension Decreased 100%  Right lateral flexion   Left lateral flexion   Right rotation    Left rotation    (Blank rows = not tested)  LOWER EXTREMITY ROM:     Active  Right eval Left eval  Hip flexion    Hip extension    Hip abduction    Hip adduction    Hip internal rotation    Hip external rotation    Knee flexion    Knee extension    Ankle dorsiflexion 0   Ankle plantarflexion    Ankle inversion 10   Ankle eversion 0    (Blank rows = not tested)  LOWER EXTREMITY MMT:    MMT Right eval Left eval  Hip flexion 4- 4-  Hip extension 4- 3+  Hip abduction 3= 3+  Hip adduction    Hip internal rotation    Hip external rotation    Knee flexion 4 4-  Knee extension 4 4-  Ankle dorsiflexion 2   Ankle plantarflexion 3+   Ankle inversion 3+   Ankle eversion 1    (Blank rows = not tested)  FUNCTIONAL TESTS:  5 times sit to stand: 31 seconds Timed up and go (TUG): 21 seconds with FWW 2 minute walk test: 210 feet with FWW, c/o fatigue in the feet and calves Berg balance: 23/56  GAIT: Distance walked: 200 feet Assistive device utilized: Walker - 2 wheeled Level of assistance: Complete Independence Comments: slow, started to feel fatigue in the ankles and calves  TREATMENT DATE:  02/15/24 Nustep level 5 x 6 minutes Gait No AD, 20 ft x 2  Alt 6in box taps w/ SPC 2x10 Side steps at mat HHA x2 and x1 Standing Rows green 2x15  Sit to stands x10  On airex from elevated mat x10, instability  On airex reaching oside BOS 25# leg  curls 2x10 Leg ext 5lb 2x10  02/13/24 Nustep level 5 x 6 minutes Standing red tband row Red tband extension Ball toss and catch 20# leg curls Red tband ankle exercises Passive right ankle stretch HHA side stepping fwd and backward walking Gait with CGA and two SPC's 110 feet 3# LAQ 3# marches Standing 3# hip abduction  Sit to stand with yellow weighted ball   01/30/24 Evaluation Nustep level 5 x 6 mintues                                                                                                                                  PATIENT EDUCATION:  Education details: POC Person educated: Patient Education method: Explanation Education comprehension: verbalized understanding  HOME EXERCISE PROGRAM: TBD  ASSESSMENT:  CLINICAL IMPRESSION: Again patient tolerated exercises well today. Progressed to gait without AD but with some hesitation. Encouragement needed  to complete side steps with 1 hand assist. Some instability on airex pad, difficulty with sit to stand on airex.  Patient is a 89 y.o. female who was seen today for physical therapy evaluation and treatment for LE weakness, poor balance and some LBP.   She reports that over the past few years she has had more and more difficulty walking, she thought she had a pinched nerve but MD ruled this out.  Right ankle is very weak, left knee and hip are weak, she is at a very high risk for falls with Berg balance test score being 23/56.  She has to hold onto things to keep her balance  OBJECTIVE IMPAIRMENTS: Abnormal gait, cardiopulmonary status limiting activity, decreased activity tolerance, decreased balance, decreased coordination, decreased endurance, decreased mobility, difficulty walking, decreased ROM, decreased strength, increased muscle spasms, impaired flexibility, improper body mechanics, postural dysfunction, and pain.   REHAB POTENTIAL: Good  CLINICAL DECISION MAKING: Stable/uncomplicated  EVALUATION COMPLEXITY: Low   GOALS: Goals reviewed with patient? Yes  SHORT TERM GOALS: Target date: 02/24/24  Independent with initial hep Baseline: Goal status: ongoing 02/13/24 05/01/24 LONG TERM GOALS: Target date: 04/03/24  Independent with advanced HEP Baseline:  Goal status: INITIAL  2.  Decrease TUG time to 16 seconds Baseline:  Goal status: INITIAL  3.  Improve Berg balance score to 42/56 Baseline:  Goal status: INITIAL  4.  Be able to walk 300 feet in Baseline:  Goal status: INITIAL  5.  Walk 200 feet with SPC and  SBA Baseline:  Goal status: INITIAL  PLAN:  PT FREQUENCY: 1-2x/week  PT DURATION: 12 weeks  PLANNED INTERVENTIONS: 97164- PT Re-evaluation, 97110-Therapeutic exercises, 97530- Therapeutic activity, 97112- Neuromuscular re-education, 97535- Self Care, [id number]- Manual therapy, [phone]- Gait training, [phone]- Electrical stimulation (unattended), Patient/Family education, Balance training, Stair training, Taping, Joint mobilization, Cryotherapy, and Moist heat.  PLAN FOR NEXT SESSION: balance, strength, functional gait   Tanda KANDICE Sorrow, PTA 02/15/2024, 2:24 PM

## 2024-02-20 ENCOUNTER — Ambulatory Visit: Admitting: Physical Therapy

## 2024-02-20 ENCOUNTER — Encounter: Payer: Self-pay | Admitting: Physical Therapy

## 2024-02-20 DIAGNOSIS — M5417 Radiculopathy, lumbosacral region: Secondary | ICD-10-CM | POA: Diagnosis not present

## 2024-02-20 DIAGNOSIS — R262 Difficulty in walking, not elsewhere classified: Secondary | ICD-10-CM

## 2024-02-20 DIAGNOSIS — M5459 Other low back pain: Secondary | ICD-10-CM

## 2024-02-20 DIAGNOSIS — M6281 Muscle weakness (generalized): Secondary | ICD-10-CM

## 2024-02-20 NOTE — Therapy (Signed)
 OUTPATIENT PHYSICAL THERAPY THORACOLUMBAR TREATMENT   Patient Name: [name] MRN: [id number] DOB:02-01-1933, 89 y.o., female Today's Date: 02/20/2024  END OF SESSION:  PT End of Session - 02/20/24 1513     Visit Number 4    Date for Recertification  04/03/24    PT Start Time 1515    PT Stop Time 1600    PT Time Calculation (min) 45 min    Activity Tolerance Patient tolerated treatment well    Behavior During Therapy [facility] for tasks assessed/performed          Past Medical History:  Diagnosis Date   Acalculous cholecystitis 08/24/2018   Arthritis    DVT of lower extremity (deep venous thrombosis) (HCC)    right leg - after back surgery in 2019   GERD (gastroesophageal reflux disease)    History of blood transfusion    Hypothyroidism    PONV (postoperative nausea and vomiting)    Ptosis of both eyelids    Vertigo    Wears glasses    reading   Past Surgical History:  Procedure Laterality Date   ABDOMINAL HYSTERECTOMY  1986   APPLICATION OF ROBOTIC ASSISTANCE FOR SPINAL PROCEDURE N/A 07/17/2017   Procedure: APPLICATION OF ROBOTIC ASSISTANCE FOR SPINAL PROCEDURE;  Surgeon: Colon Shove, MD;  Location: [facility] OR;  Service: Neurosurgery;  Laterality: N/A;   APPLICATION OF ROBOTIC ASSISTANCE FOR SPINAL PROCEDURE N/A 09/01/2017   Procedure: APPLICATION OF ROBOTIC ASSISTANCE FOR SPINAL PROCEDURE;  Surgeon: Colon Shove, MD;  Location: [facility] OR;  Service: Neurosurgery;  Laterality: N/A;   APPLICATION OF ROBOTIC ASSISTANCE FOR SPINAL PROCEDURE N/A 10/27/2020   Procedure: APPLICATION OF ROBOTIC ASSISTANCE FOR SPINAL PROCEDURE;  Surgeon: Colon Shove, MD;  Location: [facility] OR;  Service: Neurosurgery;  Laterality: N/A;   BACK SURGERY  1986   lumb lam   BREAST BIOPSY Right 2011   CARPAL TUNNEL RELEASE  2000   right   CARPAL TUNNEL RELEASE  2012   left   CHOLECYSTECTOMY N/A 08/24/2018   Procedure: LAPAROSCOPIC CHOLECYSTECTOMY WITH INTRAOPERATIVE CHOLANGIOGRAM;  Surgeon: Gail Favorite, MD;  Location: [facility] OR;  Service: General;  Laterality: N/A;   COLONOSCOPY     CYSTOCELE REPAIR  2007   sling   DISTAL INTERPHALANGEAL JOINT FUSION Right 02/04/2014   Procedure: FUSION DISTAL INTERPHALANGEAL JOINT RIGHT INDEX FINGER ;  Surgeon: Arley Curia, MD;  Location: [facility];  Service: Orthopedics;  Laterality: Right;   EYE SURGERY Bilateral    Cataract surgery with lens implant   KNEE SURGERY  2009,2011   partial knee   LAPAROSCOPY N/A 08/24/2018   Procedure: LAPAROSCOPY DIAGNOSTIC;  Surgeon: Gail Favorite, MD;  Location: [facility] OR;  Service: General;  Laterality: N/A;   LUMBAR PERCUTANEOUS PEDICLE SCREW 4 LEVEL N/A 10/27/2020   Procedure: Thoracic nine-ten Laminectomy with extension of fusion from Thoracic ten to Thoracic six with segmental fixation (cemented) with pedicle screw and mazor;  Surgeon: Colon Shove, MD;  Location: [facility] OR;  Service: Neurosurgery;  Laterality: N/A;   OPEN REDUCTION INTERNAL FIXATION (ORIF) PROXIMAL PHALANX Left 08/30/2013   Procedure: OPEN REDUCTION INTERNAL FIXATION (ORIF) PROXIMAL PHALANX FRACTURE LEFT SMALL FINGER; SPLINT RING FINGER;  Surgeon: Arley JONELLE Curia, MD;  Location:  SURGERY CENTER;  Service: Orthopedics;  Laterality: Left;   POSTERIOR LUMBAR FUSION 4 LEVEL N/A 07/17/2017   Procedure: Thoracic Ten - Lumbar Five revison of hardware with Mazor;  Surgeon: Colon Shove, MD;  Location: [facility] OR;  Service: Neurosurgery;  Laterality: N/A;  Thoracic/Lumbar   PTOSIS REPAIR Bilateral 07/27/2015   Procedure: PTOSIS REPAIR;  Surgeon: Elna Pick, MD;  Location: [facility];  Service: Plastics;  Laterality: Bilateral;   SHOULDER SURGERY     rt rcr,and lt   TENOLYSIS Left 05/21/2021   Procedure: RELEASE EXTENSOR POLLICIS LONGUS LEFT;  Surgeon: Murrell Kuba, MD;  Location: [facility];  Service: Orthopedics;  Laterality: Left;   TENOLYSIS Left 06/08/2021   Procedure: RELEASE EXTENSOR POLLICIS LONGUS  LEFT;  Surgeon: Murrell Kuba, MD;  Location: [facility];  Service: Orthopedics;  Laterality: Left;   TONSILLECTOMY     TOTAL KNEE ARTHROPLASTY Right 12/23/2019   Procedure: TOTAL KNEE ARTHROPLASTY;  Surgeon: Rubie Kemps, MD;  Location: [facility];  Service: Orthopedics;  Laterality: Right;   TRIGGER FINGER RELEASE Left 05/21/2021   Procedure: RELEASE TRIGGER FINGER/A-1 PULLEY LEFT MIDDLE FINGER;  Surgeon: Murrell Kuba, MD;  Location: [facility];  Service: Orthopedics;  Laterality: Left;   TRIGGER FINGER RELEASE Left 06/08/2021   Procedure: RELEASE A-1 PULLEY LEFT MIDDLE FINGER;  Surgeon: Murrell Kuba, MD;  Location: [facility];  Service: Orthopedics;  Laterality: Left;   Patient Active Problem List   Diagnosis Date Noted   Myelopathy concurrent with and due to spinal stenosis of thoracic region (HCC) 10/27/2020   S/P total knee arthroplasty, right 12/23/2019   Acalculous cholecystitis 08/24/2018   Osteoarthritis of right knee    Vertigo    DDD (degenerative disc disease), cervical    Benign essential HTN    History of DVT (deep vein thrombosis)    Tachycardia    Steroid-induced hyperglycemia    Hypothyroidism    Neuropathic pain    Acute blood loss anemia    S/P lumbar fusion    Closed L5 vertebral fracture (HCC) 08/31/2017   Closed fracture of fifth lumbar vertebra (HCC) 08/30/2017   Lumbar vertebral fracture (HCC) 07/17/2017   Scoliosis 06/13/2017    PCP: FABIENE Rummer, MD  REFERRING PROVIDER: Colon, MD  REFERRING DIAG: back pain, neck pain  Rationale for Evaluation and Treatment: Rehabilitation  THERAPY DIAG:  Radiculopathy, lumbosacral region  Muscle weakness (generalized)  Other low back pain  Difficulty in walking, not elsewhere classified  ONSET DATE: 01/01/24  SUBJECTIVE:                                                                                                                                                                                            SUBJECTIVE STATEMENT: I felt rough until I sat down for two hours before I came here  PAtient reports that she saw Dr. Colon  the back surgeon, he feels like there really is not pinched nerve.  She reports some concerns of walking and getting around with some neck and headache.    PERTINENT HISTORY:  GERD, spine surgery 2019, 2022 x 2, 1986, R TKA 2021 PAIN:  Are you having pain? Yes: NPRS scale: 0/10 Pain location: low back, left buttock and leg Pain description: ache, shooting Aggravating factors: walking, stairs, pain can be 7/10 Relieving factors: rest pain can be 0/10  PRECAUTIONS: Fall  RED FLAGS: None   WEIGHT BEARING RESTRICTIONS: No  FALLS:  Has patient fallen in last 6 months? No and I just really try to be very careful  LIVING ENVIRONMENT: Lives with: lives with their family Lives in: House/apartment Stairs: Yes: Internal: 15 steps; can reach both Has following equipment at home: Single point cane, Walker - 2 wheeled, shower chair, and Grab bars  OCCUPATION: retired  PLOF: Independent, Independent with household mobility with device, Independent with community mobility with device, and able to drive, can shop and walk into the store with walker, goes up and down stairs to do the laundry, does all the cooking  PATIENT GOALS: better balance, walk better, would like to use the [facility]  NEXT MD VISIT: none scheduled  OBJECTIVE:  Note: Objective measures were completed at Evaluation unless otherwise noted.  DIAGNOSTIC FINDINGS:  CAT scan myelogram  COGNITION: Overall cognitive status: Within functional limits for tasks assessed     SENSATION: WFL  MUSCLE LENGTH: Tight HS, piriformis and calves  POSTURE: rounded shoulders, forward head, and decreased lumbar lordosis  PALPATION: Tight in the lumbar and the left buttock  LUMBAR ROM:   AROM eval  Flexion Decreased 50%  Extension Decreased 100%  Right lateral flexion    Left lateral flexion   Right rotation   Left rotation    (Blank rows = not tested)  LOWER EXTREMITY ROM:     Active  Right eval Left eval  Hip flexion    Hip extension    Hip abduction    Hip adduction    Hip internal rotation    Hip external rotation    Knee flexion    Knee extension    Ankle dorsiflexion 0   Ankle plantarflexion    Ankle inversion 10   Ankle eversion 0    (Blank rows = not tested)  LOWER EXTREMITY MMT:    MMT Right eval Left eval  Hip flexion 4- 4-  Hip extension 4- 3+  Hip abduction 3= 3+  Hip adduction    Hip internal rotation    Hip external rotation    Knee flexion 4 4-  Knee extension 4 4-  Ankle dorsiflexion 2   Ankle plantarflexion 3+   Ankle inversion 3+   Ankle eversion 1    (Blank rows = not tested)  FUNCTIONAL TESTS:  5 times sit to stand: 31 seconds Timed up and go (TUG): 21 seconds with FWW 2 minute walk test: 210 feet with FWW, c/o fatigue in the feet and calves Berg balance: 23/56  GAIT: Distance walked: 200 feet Assistive device utilized: Walker - 2 wheeled Level of assistance: Complete Independence Comments: slow, started to feel fatigue in the ankles and calves  TREATMENT DATE:  02/20/24 Nustep level 5 x 6 minutes Gait w/ SPC ~218ft some instability Sit to stands x10, x5 some compensation due to weakness and instability. 25# leg curls 2x12 Leg ext 10lb 2x12 Alt 6in box taps w/ SPC 2x5 each CGA to min  Hip add ball squeeze 2x10 Side steps at mat HHA x1   02/15/24 Nustep level 5 x 6 minutes Gait No AD, 20 ft x 2  Alt 6in box taps w/ SPC 2x10 Side steps at mat HHA x2 and x1 Standing Rows green 2x15  Sit to stands x10  On airex from elevated mat x10, instability  On airex reaching oside BOS 25# leg curls 2x10 Leg ext 5lb 2x10  02/13/24 Nustep level 5 x 6 minutes Standing red tband row Red tband extension Ball toss and catch 20# leg curls Red tband ankle exercises Passive right ankle stretch HHA  side stepping fwd and backward walking Gait with CGA and two SPC's 110 feet 3# LAQ 3# marches Standing 3# hip abduction  Sit to stand with yellow weighted ball   01/30/24 Evaluation Nustep level 5 x 6 mintues                                                                                                                                 PATIENT EDUCATION:  Education details: POC Person educated: Patient Education method: Explanation Education comprehension: verbalized understanding  HOME EXERCISE PROGRAM: TBD  ASSESSMENT:  CLINICAL IMPRESSION: Pt enters with reports of fatigue from multiple days of MD appointments. This fatigue did carry over to functional interventions today. Progressed towards gait goal with SPC.  Encouragement needed  to complete side steps with 1 hand assist. Some apprehension with alt box taps.     Patient is a 89 y.o. female who was seen today for physical therapy evaluation and treatment for LE weakness, poor balance and some LBP.   She reports that over the past few years she has had more and more difficulty walking, she thought she had a pinched nerve but MD ruled this out.  Right ankle is very weak, left knee and hip are weak, she is at a very high risk for falls with Berg balance test score being 23/56.  She has to hold onto things to keep her balance  OBJECTIVE IMPAIRMENTS: Abnormal gait, cardiopulmonary status limiting activity, decreased activity tolerance, decreased balance, decreased coordination, decreased endurance, decreased mobility, difficulty walking, decreased ROM, decreased strength, increased muscle spasms, impaired flexibility, improper body mechanics, postural dysfunction, and pain.   REHAB POTENTIAL: Good  CLINICAL DECISION MAKING: Stable/uncomplicated  EVALUATION COMPLEXITY: Low   GOALS: Goals reviewed with patient? Yes  SHORT TERM GOALS: Target date: 02/24/24  Independent with initial hep Baseline: Goal status: ongoing  02/13/24 05/01/24 LONG TERM GOALS: Target date: 04/03/24  Independent with advanced HEP Baseline:  Goal status: INITIAL  2.  Decrease TUG time to 16 seconds Baseline:  Goal status: INITIAL  3.  Improve Berg balance score to 42/56 Baseline:  Goal status: INITIAL  4.  Be able to walk 300 feet in Baseline:  Goal status: INITIAL  5.  Walk 200 feet with SPC and SBA Baseline:  Goal status: Progressing 02/20/24  PLAN:  PT FREQUENCY: 1-2x/week  PT DURATION: 12 weeks  PLANNED INTERVENTIONS: 97164- PT Re-evaluation, 97110-Therapeutic exercises, 97530- Therapeutic activity, 97112- Neuromuscular re-education, 97535- Self Care, [id number]- Manual therapy, [id number]- Gait training, [id number]- Electrical stimulation (unattended), Patient/Family education, Balance training, Stair training, Taping, Joint mobilization, Cryotherapy, and Moist heat.  PLAN FOR NEXT SESSION: balance, strength, functional gait   Tanda KANDICE Sorrow, PTA 02/20/2024, 3:13 PM

## 2024-03-05 ENCOUNTER — Ambulatory Visit: Admitting: Physical Therapy

## 2024-03-07 ENCOUNTER — Encounter: Payer: Self-pay | Admitting: Physical Therapy

## 2024-03-07 ENCOUNTER — Ambulatory Visit: Attending: Internal Medicine | Admitting: Physical Therapy

## 2024-03-07 DIAGNOSIS — M5459 Other low back pain: Secondary | ICD-10-CM | POA: Diagnosis present

## 2024-03-07 DIAGNOSIS — M5417 Radiculopathy, lumbosacral region: Secondary | ICD-10-CM | POA: Insufficient documentation

## 2024-03-07 DIAGNOSIS — M6281 Muscle weakness (generalized): Secondary | ICD-10-CM | POA: Diagnosis present

## 2024-03-07 DIAGNOSIS — R2681 Unsteadiness on feet: Secondary | ICD-10-CM | POA: Diagnosis present

## 2024-03-07 DIAGNOSIS — R262 Difficulty in walking, not elsewhere classified: Secondary | ICD-10-CM | POA: Diagnosis present

## 2024-03-07 NOTE — Therapy (Signed)
 OUTPATIENT PHYSICAL THERAPY THORACOLUMBAR TREATMENT   Patient Name: [name] MRN: [id number] DOB:May 27, 1932, 89 y.o., female Today's Date: 03/07/2024  END OF SESSION:  PT End of Session - 03/07/24 1418     Visit Number 5    Date for Recertification  04/03/24    PT Start Time 1420    PT Stop Time 1505    PT Time Calculation (min) 45 min    Activity Tolerance Patient tolerated treatment well    Behavior During Therapy [facility] for tasks assessed/performed          Past Medical History:  Diagnosis Date   Acalculous cholecystitis 08/24/2018   Arthritis    DVT of lower extremity (deep venous thrombosis) (HCC)    right leg - after back surgery in 2019   GERD (gastroesophageal reflux disease)    History of blood transfusion    Hypothyroidism    PONV (postoperative nausea and vomiting)    Ptosis of both eyelids    Vertigo    Wears glasses    reading   Past Surgical History:  Procedure Laterality Date   ABDOMINAL HYSTERECTOMY  1986   APPLICATION OF ROBOTIC ASSISTANCE FOR SPINAL PROCEDURE N/A 07/17/2017   Procedure: APPLICATION OF ROBOTIC ASSISTANCE FOR SPINAL PROCEDURE;  Surgeon: Colon Shove, MD;  Location: [facility] OR;  Service: Neurosurgery;  Laterality: N/A;   APPLICATION OF ROBOTIC ASSISTANCE FOR SPINAL PROCEDURE N/A 09/01/2017   Procedure: APPLICATION OF ROBOTIC ASSISTANCE FOR SPINAL PROCEDURE;  Surgeon: Colon Shove, MD;  Location: [facility] OR;  Service: Neurosurgery;  Laterality: N/A;   APPLICATION OF ROBOTIC ASSISTANCE FOR SPINAL PROCEDURE N/A 10/27/2020   Procedure: APPLICATION OF ROBOTIC ASSISTANCE FOR SPINAL PROCEDURE;  Surgeon: Colon Shove, MD;  Location: [facility] OR;  Service: Neurosurgery;  Laterality: N/A;   BACK SURGERY  1986   lumb lam   BREAST BIOPSY Right 2011   CARPAL TUNNEL RELEASE  2000   right   CARPAL TUNNEL RELEASE  2012   left   CHOLECYSTECTOMY N/A 08/24/2018   Procedure: LAPAROSCOPIC CHOLECYSTECTOMY WITH INTRAOPERATIVE CHOLANGIOGRAM;  Surgeon: Gail Favorite, MD;  Location: [facility];  Service: General;  Laterality: N/A;   COLONOSCOPY     CYSTOCELE REPAIR  2007   sling   DISTAL INTERPHALANGEAL JOINT FUSION Right 02/04/2014   Procedure: FUSION DISTAL INTERPHALANGEAL JOINT RIGHT INDEX FINGER ;  Surgeon: Arley Curia, MD;  Location: [facility];  Service: Orthopedics;  Laterality: Right;   EYE SURGERY Bilateral    Cataract surgery with lens implant   KNEE SURGERY  2009,2011   partial knee   LAPAROSCOPY N/A 08/24/2018   Procedure: LAPAROSCOPY DIAGNOSTIC;  Surgeon: Gail Favorite, MD;  Location: [facility] OR;  Service: General;  Laterality: N/A;   LUMBAR PERCUTANEOUS PEDICLE SCREW 4 LEVEL N/A 10/27/2020   Procedure: Thoracic nine-ten Laminectomy with extension of fusion from Thoracic ten to Thoracic six with segmental fixation (cemented) with pedicle screw and mazor;  Surgeon: Colon Shove, MD;  Location: [facility] OR;  Service: Neurosurgery;  Laterality: N/A;   OPEN REDUCTION INTERNAL FIXATION (ORIF) PROXIMAL PHALANX Left 08/30/2013   Procedure: OPEN REDUCTION INTERNAL FIXATION (ORIF) PROXIMAL PHALANX FRACTURE LEFT SMALL FINGER; SPLINT RING FINGER;  Surgeon: Arley JONELLE Curia, MD;  Location: [facility];  Service: Orthopedics;  Laterality: Left;   POSTERIOR LUMBAR FUSION 4 LEVEL N/A 07/17/2017   Procedure: Thoracic Ten - Lumbar Five revison of hardware with Mazor;  Surgeon: Colon Shove, MD;  Location: [facility] OR;  Service: Neurosurgery;  Laterality: N/A;  Thoracic/Lumbar   PTOSIS REPAIR Bilateral 07/27/2015   Procedure: PTOSIS REPAIR;  Surgeon: Elna Pick, MD;  Location: [facility];  Service: Plastics;  Laterality: Bilateral;   SHOULDER SURGERY     rt rcr,and lt   TENOLYSIS Left 05/21/2021   Procedure: RELEASE EXTENSOR POLLICIS LONGUS LEFT;  Surgeon: Murrell Kuba, MD;  Location: [facility];  Service: Orthopedics;  Laterality: Left;   TENOLYSIS Left 06/08/2021   Procedure: RELEASE EXTENSOR POLLICIS LONGUS  LEFT;  Surgeon: Murrell Kuba, MD;  Location: [facility];  Service: Orthopedics;  Laterality: Left;   TONSILLECTOMY     TOTAL KNEE ARTHROPLASTY Right 12/23/2019   Procedure: TOTAL KNEE ARTHROPLASTY;  Surgeon: Rubie Kemps, MD;  Location: [facility];  Service: Orthopedics;  Laterality: Right;   TRIGGER FINGER RELEASE Left 05/21/2021   Procedure: RELEASE TRIGGER FINGER/A-1 PULLEY LEFT MIDDLE FINGER;  Surgeon: Murrell Kuba, MD;  Location: [facility];  Service: Orthopedics;  Laterality: Left;   TRIGGER FINGER RELEASE Left 06/08/2021   Procedure: RELEASE A-1 PULLEY LEFT MIDDLE FINGER;  Surgeon: Murrell Kuba, MD;  Location: [facility];  Service: Orthopedics;  Laterality: Left;   Patient Active Problem List   Diagnosis Date Noted   Myelopathy concurrent with and due to spinal stenosis of thoracic region (HCC) 10/27/2020   S/P total knee arthroplasty, right 12/23/2019   Acalculous cholecystitis 08/24/2018   Osteoarthritis of right knee    Vertigo    DDD (degenerative disc disease), cervical    Benign essential HTN    History of DVT (deep vein thrombosis)    Tachycardia    Steroid-induced hyperglycemia    Hypothyroidism    Neuropathic pain    Acute blood loss anemia    S/P lumbar fusion    Closed L5 vertebral fracture (HCC) 08/31/2017   Closed fracture of fifth lumbar vertebra (HCC) 08/30/2017   Lumbar vertebral fracture (HCC) 07/17/2017   Scoliosis 06/13/2017    PCP: FABIENE Rummer, MD  REFERRING PROVIDER: Colon, MD  REFERRING DIAG: back pain, neck pain  Rationale for Evaluation and Treatment: Rehabilitation  THERAPY DIAG:  Radiculopathy, lumbosacral region  Muscle weakness (generalized)  Difficulty in walking, not elsewhere classified  Unsteady gait  ONSET DATE: 01/01/24  SUBJECTIVE:                                                                                                                                                                                            SUBJECTIVE STATEMENT:  Doing ok, Physical yesterday  PAtient reports that she saw Dr. Colon the back surgeon, he feels like there really is not pinched  nerve.  She reports some concerns of walking and getting around with some neck and headache.    PERTINENT HISTORY:  GERD, spine surgery 2019, 2022 x 2, 1986, R TKA 2021 PAIN:  Are you having pain? Yes: NPRS scale: 0/10 Pain location: low back, left buttock and leg Pain description: ache, shooting Aggravating factors: walking, stairs, pain can be 7/10 Relieving factors: rest pain can be 0/10  PRECAUTIONS: Fall  RED FLAGS: None   WEIGHT BEARING RESTRICTIONS: No  FALLS:  Has patient fallen in last 6 months? No and I just really try to be very careful  LIVING ENVIRONMENT: Lives with: lives with their family Lives in: House/apartment Stairs: Yes: Internal: 15 steps; can reach both Has following equipment at home: Single point cane, Walker - 2 wheeled, shower chair, and Grab bars  OCCUPATION: retired  PLOF: Independent, Independent with household mobility with device, Independent with community mobility with device, and able to drive, can shop and walk into the store with walker, goes up and down stairs to do the laundry, does all the cooking  PATIENT GOALS: better balance, walk better, would like to use the [facility]  NEXT MD VISIT: none scheduled  OBJECTIVE:  Note: Objective measures were completed at Evaluation unless otherwise noted.  DIAGNOSTIC FINDINGS:  CAT scan myelogram  COGNITION: Overall cognitive status: Within functional limits for tasks assessed     SENSATION: WFL  MUSCLE LENGTH: Tight HS, piriformis and calves  POSTURE: rounded shoulders, forward head, and decreased lumbar lordosis  PALPATION: Tight in the lumbar and the left buttock  LUMBAR ROM:   AROM eval  Flexion Decreased 50%  Extension Decreased 100%  Right lateral flexion   Left lateral flexion   Right  rotation   Left rotation    (Blank rows = not tested)  LOWER EXTREMITY ROM:     Active  Right eval  Hip flexion   Hip extension   Hip abduction   Hip adduction   Hip internal rotation   Hip external rotation   Knee flexion   Knee extension   Ankle dorsiflexion 0  Ankle plantarflexion   Ankle inversion 10  Ankle eversion 0   (Blank rows = not tested)  LOWER EXTREMITY MMT:    MMT Right eval Left eval  Hip flexion 4- 4-  Hip extension 4- 3+  Hip abduction 3= 3+  Hip adduction    Hip internal rotation    Hip external rotation    Knee flexion 4 4-  Knee extension 4 4-  Ankle dorsiflexion 2   Ankle plantarflexion 3+   Ankle inversion 3+   Ankle eversion 1    (Blank rows = not tested)  FUNCTIONAL TESTS:  5 times sit to stand: 31 seconds Timed up and go (TUG): 21 seconds with FWW 2 minute walk test: 210 feet with FWW, c/o fatigue in the feet and calves Berg balance: 23/56  GAIT: Distance walked: 200 feet Assistive device utilized: Walker - 2 wheeled Level of assistance: Complete Independence Comments: slow, started to feel fatigue in the ankles and calves  TREATMENT DATE:  03/07/24 Nustep level 5 x 6 minutes Gait w/ SPC ~22ft SBA TUG 14.09 sec Sit to stand 2x10 Alt 6 inch box taps w/ SPC x10, x5  6in step up 1 rail 2x5 Gait no AD 82ft SBA s98me instability  02/20/24 Nustep level 5 x 6 minutes Gait w/ SPC ~230ft some instability Sit to stands x10, x5 some compensation due to weakness and instability. 25#  leg curls 2x12 Leg ext 10lb 2x12 Alt 6in box taps w/ SPC 2x5 each CGA to min Hip add ball squeeze 2x10 Side steps at mat HHA x1   02/15/24 Nustep level 5 x 6 minutes Gait No AD, 20 ft x 2  Alt 6in box taps w/ SPC 2x10 Side steps at mat HHA x2 and x1 Standing Rows green 2x15  Sit to stands x10  On airex from elevated mat x10, instability  On airex reaching oside BOS 25# leg curls 2x10 Leg ext 5lb 2x10  02/13/24 Nustep level 5 x 6  minutes Standing red tband row Red tband extension Ball toss and catch 20# leg curls Red tband ankle exercises Passive right ankle stretch HHA side stepping fwd and backward walking Gait with CGA and two SPC's 110 feet 3# LAQ 3# marches Standing 3# hip abduction  Sit to stand with yellow weighted ball   01/30/24 Evaluation Nustep level 5 x 6 mintues                                                                                                                                 PATIENT EDUCATION:  Education details: POC Person educated: Patient Education method: Explanation Education comprehension: verbalized understanding  HOME EXERCISE PROGRAM: TBD  ASSESSMENT:  CLINICAL IMPRESSION: Pt enters doing well overall. She has progressed meeting TUG and gait goal w/ SPC.   Encouragement needed  to complete step ups with one rail and to attempt gait trial without AD. All interventions completed well without repots of increase pain,   Patient is a 89 y.o. female who was seen today for physical therapy evaluation and treatment for LE weakness, poor balance and some LBP.   She reports that over the past few years she has had more and more difficulty walking, she thought she had a pinched nerve but MD ruled this out.  Right ankle is very weak, left knee and hip are weak, she is at a very high risk for falls with Berg balance test score being 23/56.  She has to hold onto things to keep her balance  OBJECTIVE IMPAIRMENTS: Abnormal gait, cardiopulmonary status limiting activity, decreased activity tolerance, decreased balance, decreased coordination, decreased endurance, decreased mobility, difficulty walking, decreased ROM, decreased strength, increased muscle spasms, impaired flexibility, improper body mechanics, postural dysfunction, and pain.   REHAB POTENTIAL: Good  CLINICAL DECISION MAKING: Stable/uncomplicated  EVALUATION COMPLEXITY: Low   GOALS: Goals reviewed with patient?  Yes  SHORT TERM GOALS: Target date: 02/24/24  Independent with initial hep Baseline: Goal status: ongoing 02/13/24 05/01/24 LONG TERM GOALS: Target date: 04/03/24  Independent with advanced HEP Baseline:  Goal status: INITIAL  2.  Decrease TUG time to 16 seconds Baseline:  Goal status: INITIAL  3.  Improve Berg balance score to 42/56 Baseline:  Goal status: INITIAL  4.  Be able to walk 300 feet in Baseline:  Goal status: ongoing 03/07/24  5.  Walk  200 feet with SPC and SBA Baseline:  Goal status: Progressing 02/20/24, Met 03/07/24  PLAN:  PT FREQUENCY: 1-2x/week  PT DURATION: 12 weeks  PLANNED INTERVENTIONS: 97164- PT Re-evaluation, 97110-Therapeutic exercises, 97530- Therapeutic activity, 97112- Neuromuscular re-education, 97535- Self Care, [id number]- Manual therapy, [phone]- Gait training, [phone]- Electrical stimulation (unattended), Patient/Family education, Balance training, Stair training, Taping, Joint mobilization, Cryotherapy, and Moist heat.  PLAN FOR NEXT SESSION: balance, strength, functional gait   Tanda KANDICE Sorrow, PTA 03/07/2024, 2:19 PM

## 2024-03-08 ENCOUNTER — Other Ambulatory Visit: Payer: Self-pay

## 2024-03-08 ENCOUNTER — Encounter (HOSPITAL_BASED_OUTPATIENT_CLINIC_OR_DEPARTMENT_OTHER): Payer: Self-pay | Admitting: Orthopedic Surgery

## 2024-03-11 ENCOUNTER — Ambulatory Visit: Admitting: Physical Therapy

## 2024-03-11 ENCOUNTER — Encounter: Payer: Self-pay | Admitting: Physical Therapy

## 2024-03-11 DIAGNOSIS — R262 Difficulty in walking, not elsewhere classified: Secondary | ICD-10-CM

## 2024-03-11 DIAGNOSIS — M6281 Muscle weakness (generalized): Secondary | ICD-10-CM

## 2024-03-11 DIAGNOSIS — M5459 Other low back pain: Secondary | ICD-10-CM

## 2024-03-11 DIAGNOSIS — M5417 Radiculopathy, lumbosacral region: Secondary | ICD-10-CM

## 2024-03-11 DIAGNOSIS — R2681 Unsteadiness on feet: Secondary | ICD-10-CM

## 2024-03-11 NOTE — Therapy (Signed)
 OUTPATIENT PHYSICAL THERAPY THORACOLUMBAR TREATMENT   Patient Name: CHRISSI CROW MRN: [id number] DOB:1932-11-25, 89 y.o., female Today's Date: 03/11/2024  END OF SESSION:  PT End of Session - 03/11/24 1448     Visit Number 6    Number of Visits 13    Date for Recertification  04/03/24    Authorization Type U[employer]    PT Start Time 1443    PT Stop Time 1530    PT Time Calculation (min) 47 min    Activity Tolerance Patient tolerated treatment well    Behavior During Therapy [facility] for tasks assessed/performed          Past Medical History:  Diagnosis Date   Acalculous cholecystitis 08/24/2018   Arthritis    DVT of lower extremity (deep venous thrombosis) (HCC)    right leg - after back surgery in 2019   GERD (gastroesophageal reflux disease)    History of blood transfusion    Hypothyroidism    PONV (postoperative nausea and vomiting)    Ptosis of both eyelids    Vertigo    Wears glasses    reading   Past Surgical History:  Procedure Laterality Date   ABDOMINAL HYSTERECTOMY  1986   APPLICATION OF ROBOTIC ASSISTANCE FOR SPINAL PROCEDURE N/A 07/17/2017   Procedure: APPLICATION OF ROBOTIC ASSISTANCE FOR SPINAL PROCEDURE;  Surgeon: Colon Shove, MD;  Location: [facility] OR;  Service: Neurosurgery;  Laterality: N/A;   APPLICATION OF ROBOTIC ASSISTANCE FOR SPINAL PROCEDURE N/A 09/01/2017   Procedure: APPLICATION OF ROBOTIC ASSISTANCE FOR SPINAL PROCEDURE;  Surgeon: Colon Shove, MD;  Location: [facility] OR;  Service: Neurosurgery;  Laterality: N/A;   APPLICATION OF ROBOTIC ASSISTANCE FOR SPINAL PROCEDURE N/A 10/27/2020   Procedure: APPLICATION OF ROBOTIC ASSISTANCE FOR SPINAL PROCEDURE;  Surgeon: Colon Shove, MD;  Location: [facility] OR;  Service: Neurosurgery;  Laterality: N/A;   BACK SURGERY  1986   lumb lam   BREAST BIOPSY Right 2011   CARPAL TUNNEL RELEASE  2000   right   CARPAL TUNNEL RELEASE  2012   left   CHOLECYSTECTOMY N/A 08/24/2018   Procedure: LAPAROSCOPIC CHOLECYSTECTOMY  WITH INTRAOPERATIVE CHOLANGIOGRAM;  Surgeon: Gail Favorite, MD;  Location: [facility] OR;  Service: General;  Laterality: N/A;   COLONOSCOPY     CYSTOCELE REPAIR  2007   sling   DISTAL INTERPHALANGEAL JOINT FUSION Right 02/04/2014   Procedure: FUSION DISTAL INTERPHALANGEAL JOINT RIGHT INDEX FINGER ;  Surgeon: Arley Curia, MD;  Location: [facility];  Service: Orthopedics;  Laterality: Right;   EYE SURGERY Bilateral    Cataract surgery with lens implant   KNEE SURGERY  2009,2011   partial knee   LAPAROSCOPY N/A 08/24/2018   Procedure: LAPAROSCOPY DIAGNOSTIC;  Surgeon: Gail Favorite, MD;  Location: [facility] OR;  Service: General;  Laterality: N/A;   LUMBAR PERCUTANEOUS PEDICLE SCREW 4 LEVEL N/A 10/27/2020   Procedure: Thoracic nine-ten Laminectomy with extension of fusion from Thoracic ten to Thoracic six with segmental fixation (cemented) with pedicle screw and mazor;  Surgeon: Colon Shove, MD;  Location: [facility] OR;  Service: Neurosurgery;  Laterality: N/A;   OPEN REDUCTION INTERNAL FIXATION (ORIF) PROXIMAL PHALANX Left 08/30/2013   Procedure: OPEN REDUCTION INTERNAL FIXATION (ORIF) PROXIMAL PHALANX FRACTURE LEFT SMALL FINGER; SPLINT RING FINGER;  Surgeon: Arley JONELLE Curia, MD;  Location: [facility];  Service: Orthopedics;  Laterality: Left;   POSTERIOR LUMBAR FUSION 4 LEVEL N/A 07/17/2017   Procedure: Thoracic Ten - Lumbar Five revison of hardware with Mazor;  Surgeon: Colon,  Victory, MD;  Location: [facility] OR;  Service: Neurosurgery;  Laterality: N/A;  Thoracic/Lumbar   PTOSIS REPAIR Bilateral 07/27/2015   Procedure: PTOSIS REPAIR;  Surgeon: Elna Pick, MD;  Location: [facility];  Service: Plastics;  Laterality: Bilateral;   SHOULDER SURGERY     rt rcr,and lt   TENOLYSIS Left 05/21/2021   Procedure: RELEASE EXTENSOR POLLICIS LONGUS LEFT;  Surgeon: Murrell Kuba, MD;  Location: [facility];  Service: Orthopedics;  Laterality: Left;   TENOLYSIS Left  06/08/2021   Procedure: RELEASE EXTENSOR POLLICIS LONGUS LEFT;  Surgeon: Murrell Kuba, MD;  Location: [facility];  Service: Orthopedics;  Laterality: Left;   TONSILLECTOMY     TOTAL KNEE ARTHROPLASTY Right 12/23/2019   Procedure: TOTAL KNEE ARTHROPLASTY;  Surgeon: Rubie Kemps, MD;  Location: [facility];  Service: Orthopedics;  Laterality: Right;   TRIGGER FINGER RELEASE Left 05/21/2021   Procedure: RELEASE TRIGGER FINGER/A-1 PULLEY LEFT MIDDLE FINGER;  Surgeon: Murrell Kuba, MD;  Location: [facility];  Service: Orthopedics;  Laterality: Left;   TRIGGER FINGER RELEASE Left 06/08/2021   Procedure: RELEASE A-1 PULLEY LEFT MIDDLE FINGER;  Surgeon: Murrell Kuba, MD;  Location: [facility];  Service: Orthopedics;  Laterality: Left;   Patient Active Problem List   Diagnosis Date Noted   Myelopathy concurrent with and due to spinal stenosis of thoracic region (HCC) 10/27/2020   S/P total knee arthroplasty, right 12/23/2019   Acalculous cholecystitis 08/24/2018   Osteoarthritis of right knee    Vertigo    DDD (degenerative disc disease), cervical    Benign essential HTN    History of DVT (deep vein thrombosis)    Tachycardia    Steroid-induced hyperglycemia    Hypothyroidism    Neuropathic pain    Acute blood loss anemia    S/P lumbar fusion    Closed L5 vertebral fracture (HCC) 08/31/2017   Closed fracture of fifth lumbar vertebra (HCC) 08/30/2017   Lumbar vertebral fracture (HCC) 07/17/2017   Scoliosis 06/13/2017    PCP: FABIENE Rummer, MD  REFERRING PROVIDER: Colon, MD  REFERRING DIAG: back pain, neck pain  Rationale for Evaluation and Treatment: Rehabilitation  THERAPY DIAG:  Radiculopathy, lumbosacral region  Muscle weakness (generalized)  Difficulty in walking, not elsewhere classified  Unsteady gait  Other low back pain  ONSET DATE: 01/01/24  SUBJECTIVE:                                                                                                                                                                                            SUBJECTIVE STATEMENT:  I will have hand surgery  this week, I have some worries about how to walk with walker  PAtient reports that she saw Dr. Colon the back surgeon, he feels like there really is not pinched nerve.  She reports some concerns of walking and getting around with some neck and headache.    PERTINENT HISTORY:  GERD, spine surgery 2019, 2022 x 2, 1986, R TKA 2021 PAIN:  Are you having pain? Yes: NPRS scale: 0/10 Pain location: low back, left buttock and leg Pain description: ache, shooting Aggravating factors: walking, stairs, pain can be 7/10 Relieving factors: rest pain can be 0/10  PRECAUTIONS: Fall  RED FLAGS: None   WEIGHT BEARING RESTRICTIONS: No  FALLS:  Has patient fallen in last 6 months? No and I just really try to be very careful  LIVING ENVIRONMENT: Lives with: lives with their family Lives in: House/apartment Stairs: Yes: Internal: 15 steps; can reach both Has following equipment at home: Single point cane, Walker - 2 wheeled, shower chair, and Grab bars  OCCUPATION: retired  PLOF: Independent, Independent with household mobility with device, Independent with community mobility with device, and able to drive, can shop and walk into the store with walker, goes up and down stairs to do the laundry, does all the cooking  PATIENT GOALS: better balance, walk better, would like to use the [facility]  NEXT MD VISIT: none scheduled  OBJECTIVE:  Note: Objective measures were completed at Evaluation unless otherwise noted.  DIAGNOSTIC FINDINGS:  CAT scan myelogram  COGNITION: Overall cognitive status: Within functional limits for tasks assessed     SENSATION: WFL  MUSCLE LENGTH: Tight HS, piriformis and calves  POSTURE: rounded shoulders, forward head, and decreased lumbar lordosis  PALPATION: Tight in the lumbar and the left  buttock  LUMBAR ROM:   AROM eval  Flexion Decreased 50%  Extension Decreased 100%  Right lateral flexion   Left lateral flexion   Right rotation   Left rotation    (Blank rows = not tested)  LOWER EXTREMITY ROM:     Active  Right eval  Hip flexion   Hip extension   Hip abduction   Hip adduction   Hip internal rotation   Hip external rotation   Knee flexion   Knee extension   Ankle dorsiflexion 0  Ankle plantarflexion   Ankle inversion 10  Ankle eversion 0   (Blank rows = not tested)  LOWER EXTREMITY MMT:    MMT Right eval Left eval  Hip flexion 4- 4-  Hip extension 4- 3+  Hip abduction 3= 3+  Hip adduction    Hip internal rotation    Hip external rotation    Knee flexion 4 4-  Knee extension 4 4-  Ankle dorsiflexion 2   Ankle plantarflexion 3+   Ankle inversion 3+   Ankle eversion 1    (Blank rows = not tested)  FUNCTIONAL TESTS:  5 times sit to stand: 31 seconds Timed up and go (TUG): 21 seconds with FWW 2 minute walk test: 210 feet with FWW, c/o fatigue in the feet and calves Berg balance: 23/56  GAIT: Distance walked: 200 feet Assistive device utilized: Walker - 2 wheeled Level of assistance: Complete Independence Comments: slow, started to feel fatigue in the ankles and calves  TREATMENT DATE:  03/11/24 Nustep level 5 x 6 minutes Gait with SPC CGA 100 feet x 2 5X STS with weighted ball at chest 3# LAQ 3# marches 3# hip abduction with walker Ball squeeze Feet on ball K2C, rotation,  small bridge, isometric abs Blue tband clamshells Discussion on how to use walker at home after her hand surgery, discussed not using hand to pull on banister and she is going to try to stay down stairs for the first 3 nights, she does have her son coming  03/07/24 Nustep level 5 x 6 minutes Gait w/ SPC ~25ft SBA TUG 14.09 sec Sit to stand 2x10 Alt 6 inch box taps w/ SPC x10, x5  6in step up 1 rail 2x5 Gait no AD 64ft SBA s55me  instability  02/20/24 Nustep level 5 x 6 minutes Gait w/ SPC ~254ft some instability Sit to stands x10, x5 some compensation due to weakness and instability. 25# leg curls 2x12 Leg ext 10lb 2x12 Alt 6in box taps w/ SPC 2x5 each CGA to min Hip add ball squeeze 2x10 Side steps at mat HHA x1   02/15/24 Nustep level 5 x 6 minutes Gait No AD, 20 ft x 2  Alt 6in box taps w/ SPC 2x10 Side steps at mat HHA x2 and x1 Standing Rows green 2x15  Sit to stands x10  On airex from elevated mat x10, instability  On airex reaching oside BOS 25# leg curls 2x10 Leg ext 5lb 2x10  02/13/24 Nustep level 5 x 6 minutes Standing red tband row Red tband extension Ball toss and catch 20# leg curls Red tband ankle exercises Passive right ankle stretch HHA side stepping fwd and backward walking Gait with CGA and two SPC's 110 feet 3# LAQ 3# marches Standing 3# hip abduction  Sit to stand with yellow weighted ball   01/30/24 Evaluation Nustep level 5 x 6 mintues                                                                                                                                 PATIENT EDUCATION:  Education details: POC Person educated: Patient Education method: Explanation Education comprehension: verbalized understanding  HOME EXERCISE PROGRAM: TBD  ASSESSMENT:  CLINICAL IMPRESSION: Pt will be having hand surgery later this week, she reports that she has worries about how to use walker after the surgery, we went over and tried some different options, I asked her to ask the MD about any restrictions that she will have with the hand and gripping and pulling as she reports that to go up stairs she has to pull, the right hip really does have the trendelenberg gait pattern today   Encouragement needed  to complete step ups with one rail and to attempt gait trial without AD. All interventions completed well without reports of increase pain,   Patient is a 89 y.o. female who was  seen today for physical therapy evaluation and treatment for LE weakness, poor balance and some LBP.   She reports that over the past few years she has had more and more difficulty walking, she thought she had a pinched nerve but MD ruled this out.  Right ankle is  very weak, left knee and hip are weak, she is at a very high risk for falls with Berg balance test score being 23/56.  She has to hold onto things to keep her balance  OBJECTIVE IMPAIRMENTS: Abnormal gait, cardiopulmonary status limiting activity, decreased activity tolerance, decreased balance, decreased coordination, decreased endurance, decreased mobility, difficulty walking, decreased ROM, decreased strength, increased muscle spasms, impaired flexibility, improper body mechanics, postural dysfunction, and pain.   REHAB POTENTIAL: Good  CLINICAL DECISION MAKING: Stable/uncomplicated  EVALUATION COMPLEXITY: Low   GOALS: Goals reviewed with patient? Yes  SHORT TERM GOALS: Target date: 02/24/24  Independent with initial hep Baseline: Goal status: met 03/11/24  LONG TERM GOALS: Target date: 04/03/24  Independent with advanced HEP Baseline:  Goal status: INITIAL  2.  Decrease TUG time to 16 seconds Baseline:  Goal status: progressing 03/11/24  3.  Improve Berg balance score to 42/56 Baseline:  Goal status: INITIAL  4.  Be able to walk 300 feet in Baseline:  Goal status: ongoing 03/07/24  5.  Walk 200 feet with SPC and SBA Baseline:  Goal status: Progressing 02/20/24, Met 03/07/24  PLAN:  PT FREQUENCY: 1-2x/week  PT DURATION: 12 weeks  PLANNED INTERVENTIONS: 97164- PT Re-evaluation, 97110-Therapeutic exercises, 97530- Therapeutic activity, 97112- Neuromuscular re-education, 97535- Self Care, 02859- Manual therapy, [phone]- Gait training, [phone]- Electrical stimulation (unattended), Patient/Family education, Balance training, Stair training, Taping, Joint mobilization, Cryotherapy, and Moist heat.  PLAN FOR NEXT  SESSION: balance, strength, functional gait   Josmar Messimer W, PT 03/11/2024, 2:48 PM

## 2024-03-14 ENCOUNTER — Encounter (HOSPITAL_BASED_OUTPATIENT_CLINIC_OR_DEPARTMENT_OTHER): Admission: RE | Disposition: A | Payer: Self-pay | Source: Home / Self Care | Attending: Orthopedic Surgery

## 2024-03-14 ENCOUNTER — Ambulatory Visit (HOSPITAL_BASED_OUTPATIENT_CLINIC_OR_DEPARTMENT_OTHER): Admitting: Anesthesiology

## 2024-03-14 ENCOUNTER — Ambulatory Visit (HOSPITAL_BASED_OUTPATIENT_CLINIC_OR_DEPARTMENT_OTHER)
Admission: RE | Admit: 2024-03-14 | Discharge: 2024-03-14 | Disposition: A | Attending: Orthopedic Surgery | Admitting: Orthopedic Surgery

## 2024-03-14 ENCOUNTER — Other Ambulatory Visit: Payer: Self-pay

## 2024-03-14 ENCOUNTER — Encounter (HOSPITAL_BASED_OUTPATIENT_CLINIC_OR_DEPARTMENT_OTHER): Payer: Self-pay | Admitting: Orthopedic Surgery

## 2024-03-14 DIAGNOSIS — M65842 Other synovitis and tenosynovitis, left hand: Secondary | ICD-10-CM | POA: Diagnosis not present

## 2024-03-14 HISTORY — PX: TENOSYNOVECTOMY: SHX6110

## 2024-03-14 SURGERY — TENOSYNOVECTOMY
Anesthesia: General | Site: Wrist | Laterality: Left

## 2024-03-14 MED ORDER — PROPOFOL 10 MG/ML IV BOLUS: INTRAVENOUS | Status: DC | PRN

## 2024-03-14 MED ORDER — FENTANYL CITRATE (PF) 100 MCG/2ML IJ SOLN: 25.0000 ug | INTRAMUSCULAR | Status: DC | PRN

## 2024-03-14 MED ORDER — DEXAMETHASONE SODIUM PHOSPHATE 4 MG/ML IJ SOLN
INTRAMUSCULAR | Status: DC | PRN
  Administered 2024-03-14: 4 mg via INTRAVENOUS

## 2024-03-14 MED ORDER — LEVOFLOXACIN IN D5W 500 MG/100ML IV SOLN: 500.0000 mg | INTRAVENOUS | Status: DC

## 2024-03-14 MED ORDER — PROPOFOL 500 MG/50ML IV EMUL
INTRAVENOUS | Status: DC | PRN
  Administered 2024-03-14: 100 ug/kg/min via INTRAVENOUS

## 2024-03-14 MED ORDER — ACETAMINOPHEN 500 MG PO TABS
1000.0000 mg | ORAL_TABLET | Freq: Once | ORAL | Status: AC
  Administered 2024-03-14: 1000 mg via ORAL

## 2024-03-14 MED ORDER — LEVOFLOXACIN IN D5W 500 MG/100ML IV SOLN
INTRAVENOUS | Status: AC
  Filled 2024-03-14: qty 100

## 2024-03-14 MED ORDER — FENTANYL CITRATE (PF) 100 MCG/2ML IJ SOLN
INTRAMUSCULAR | Status: AC
  Filled 2024-03-14: qty 2

## 2024-03-14 MED ORDER — LIDOCAINE 2% (20 MG/ML) 5 ML SYRINGE
INTRAMUSCULAR | Status: AC
  Filled 2024-03-14: qty 5

## 2024-03-14 MED ORDER — MIDAZOLAM HCL (PF) 2 MG/2ML IJ SOLN: 1.0000 mg | Freq: Once | INTRAMUSCULAR | Status: DC

## 2024-03-14 MED ORDER — ATROPINE SULFATE (PF) 0.4 MG/ML IJ SOLN
INTRAMUSCULAR | Status: AC
  Filled 2024-03-14: qty 1

## 2024-03-14 MED ORDER — VANCOMYCIN HCL IN DEXTROSE 1-5 GM/200ML-% IV SOLN
1000.0000 mg | INTRAVENOUS | Status: AC
  Administered 2024-03-14: 1000 mg via INTRAVENOUS

## 2024-03-14 MED ORDER — FENTANYL CITRATE (PF) 100 MCG/2ML IJ SOLN
50.0000 ug | Freq: Once | INTRAMUSCULAR | Status: AC
  Administered 2024-03-14: 100 ug via INTRAVENOUS

## 2024-03-14 MED ORDER — BUPIVACAINE-EPINEPHRINE (PF) 0.5% -1:200000 IJ SOLN
INTRAMUSCULAR | Status: DC | PRN
  Administered 2024-03-14: 30 mL via PERINEURAL

## 2024-03-14 MED ORDER — LACTATED RINGERS IV SOLN: INTRAVENOUS | Status: DC

## 2024-03-14 MED ORDER — PHENYLEPHRINE HCL-NACL 20-0.9 MG/250ML-% IV SOLN
INTRAVENOUS | Status: DC | PRN
  Administered 2024-03-14: 40 ug/min via INTRAVENOUS

## 2024-03-14 MED ORDER — MIDAZOLAM HCL 2 MG/2ML IJ SOLN
INTRAMUSCULAR | Status: AC
  Filled 2024-03-14: qty 2

## 2024-03-14 MED ORDER — ACETAMINOPHEN 500 MG PO TABS
ORAL_TABLET | ORAL | Status: AC
  Filled 2024-03-14: qty 2

## 2024-03-14 MED ORDER — EPHEDRINE SULFATE (PRESSORS) 25 MG/5ML IV SOSY
PREFILLED_SYRINGE | INTRAVENOUS | Status: DC | PRN
  Administered 2024-03-14: 15 mg via INTRAVENOUS

## 2024-03-14 MED ORDER — ONDANSETRON HCL 4 MG/2ML IJ SOLN
INTRAMUSCULAR | Status: AC
  Filled 2024-03-14: qty 2

## 2024-03-14 MED ORDER — EPHEDRINE 5 MG/ML INJ
INTRAVENOUS | Status: AC
  Filled 2024-03-14: qty 5

## 2024-03-14 MED ORDER — HYDROCODONE-ACETAMINOPHEN 5-325 MG PO TABS: 1.0000 | ORAL_TABLET | Freq: Four times a day (QID) | ORAL | 0 refills | Status: AC | PRN

## 2024-03-14 MED ORDER — SUCCINYLCHOLINE CHLORIDE 200 MG/10ML IV SOSY
PREFILLED_SYRINGE | INTRAVENOUS | Status: AC
  Filled 2024-03-14: qty 10

## 2024-03-14 MED ORDER — PHENYLEPHRINE 80 MCG/ML (10ML) SYRINGE FOR IV PUSH (FOR BLOOD PRESSURE SUPPORT)
PREFILLED_SYRINGE | INTRAVENOUS | Status: AC
  Filled 2024-03-14: qty 10

## 2024-03-14 MED ORDER — KETOROLAC TROMETHAMINE 30 MG/ML IJ SOLN
INTRAMUSCULAR | Status: AC
  Filled 2024-03-14: qty 1

## 2024-03-14 MED ORDER — VANCOMYCIN HCL IN DEXTROSE 1-5 GM/200ML-% IV SOLN
INTRAVENOUS | Status: AC
  Filled 2024-03-14: qty 200

## 2024-03-14 MED ORDER — ONDANSETRON HCL 4 MG/2ML IJ SOLN
INTRAMUSCULAR | Status: DC | PRN
  Administered 2024-03-14: 4 mg via INTRAVENOUS

## 2024-03-14 MED ORDER — 0.9 % SODIUM CHLORIDE (POUR BTL) OPTIME
TOPICAL | Status: DC | PRN
  Administered 2024-03-14: 50 mL

## 2024-03-14 MED ORDER — PROPOFOL 10 MG/ML IV BOLUS
INTRAVENOUS | Status: DC | PRN
  Administered 2024-03-14: 100 mg via INTRAVENOUS
  Administered 2024-03-14: 50 mg via INTRAVENOUS

## 2024-03-14 SURGICAL SUPPLY — 34 items
BLADE MINI RND TIP GREEN BEAV (BLADE) IMPLANT
BLADE SURG 15 STRL LF DISP TIS (BLADE) ×2 IMPLANT
BNDG COMPR ESMARK 4X3 LF (GAUZE/BANDAGES/DRESSINGS) IMPLANT
BNDG ELASTIC 3INX 5YD STR LF (GAUZE/BANDAGES/DRESSINGS) IMPLANT
BNDG GAUZE DERMACEA FLUFF 4 (GAUZE/BANDAGES/DRESSINGS) ×1 IMPLANT
CHLORAPREP W/TINT 26 (MISCELLANEOUS) ×1 IMPLANT
CORD BIPOLAR FORCEPS 12FT (ELECTRODE) ×1 IMPLANT
COVER BACK TABLE 60X90IN (DRAPES) ×1 IMPLANT
COVER MAYO STAND STRL (DRAPES) ×1 IMPLANT
CUFF TOURN SGL QUICK 18X4 (TOURNIQUET CUFF) ×1 IMPLANT
DRAPE EXTREMITY T 121X128X90 (DISPOSABLE) ×1 IMPLANT
DRAPE SURG 17X23 STRL (DRAPES) ×1 IMPLANT
GAUZE PAD ABD 8X10 STRL (GAUZE/BANDAGES/DRESSINGS) ×1 IMPLANT
GAUZE SPONGE 4X4 12PLY STRL (GAUZE/BANDAGES/DRESSINGS) ×1 IMPLANT
GAUZE XEROFORM 1X8 LF (GAUZE/BANDAGES/DRESSINGS) ×1 IMPLANT
GLOVE BIO SURGEON STRL SZ7.5 (GLOVE) ×1 IMPLANT
GLOVE BIOGEL PI IND STRL 8 (GLOVE) ×1 IMPLANT
GOWN STRL REUS W/ TWL LRG LVL3 (GOWN DISPOSABLE) ×1 IMPLANT
GOWN STRL REUS W/TWL XL LVL3 (GOWN DISPOSABLE) ×1 IMPLANT
NDL HYPO 25X1 1.5 SAFETY (NEEDLE) ×1 IMPLANT
PACK BASIN DAY SURGERY FS (CUSTOM PROCEDURE TRAY) ×1 IMPLANT
PAD CAST 4YDX4 CTTN HI CHSV (CAST SUPPLIES) IMPLANT
PADDING CAST ABS COTTON 4X4 ST (CAST SUPPLIES) ×1 IMPLANT
SLEEVE SCD COMPRESS KNEE MED (STOCKING) IMPLANT
SLING ARM FOAM STRAP LRG (SOFTGOODS) IMPLANT
SOLN 0.9% NACL POUR BTL 1000ML (IV SOLUTION) ×1 IMPLANT
SPLINT PLASTER CAST XFAST 3X15 (CAST SUPPLIES) IMPLANT
STOCKINETTE 4X48 STRL (DRAPES) ×1 IMPLANT
SUT ETHILON 4 0 PS 2 18 (SUTURE) ×1 IMPLANT
SUT VIC AB 4-0 RB1 27X BRD (SUTURE) IMPLANT
SYR BULB EAR ULCER 3OZ GRN STR (SYRINGE) ×1 IMPLANT
SYR CONTROL 10ML LL (SYRINGE) ×1 IMPLANT
TOWEL GREEN STERILE FF (TOWEL DISPOSABLE) ×1 IMPLANT
UNDERPAD 30X36 HEAVY ABSORB (UNDERPADS AND DIAPERS) ×1 IMPLANT

## 2024-03-14 NOTE — Anesthesia Postprocedure Evaluation (Signed)
 Anesthesia Post Note  Patient: [name]  Procedure(s) Performed: TENOSYNOVECTOMY (Left: Wrist)     Patient location during evaluation: PACU Anesthesia Type: General and Regional Level of consciousness: awake and alert Pain management: pain level controlled Vital Signs Assessment: post-procedure vital signs reviewed and stable Respiratory status: spontaneous breathing, nonlabored ventilation and respiratory function stable Cardiovascular status: blood pressure returned to baseline and stable Postop Assessment: no apparent nausea or vomiting Anesthetic complications: no   No notable events documented.  Last Vitals:  Vitals:   03/14/24 1145 03/14/24 1204  BP: 131/84 131/71  Pulse: 61 63  Resp: 19 16  Temp:  36.7 C  SpO2: 92% 95%    Last Pain:  Vitals:   03/14/24 1204  TempSrc: Temporal  PainSc: 0-No pain                 Kobi Aller,W. EDMOND

## 2024-03-14 NOTE — Op Note (Signed)
 NAME: [name] MEDICAL RECORD NO: [id number] DATE OF BIRTH: 1932/10/27 FACILITY: [facility] LOCATION: [facility] PHYSICIAN: Elexus Barman R. Kache Mcclurg, MD   OPERATIVE REPORT   DATE OF PROCEDURE: 03/14/2024    PREOPERATIVE DIAGNOSIS: Left wrist extensor tenosynovitis   POSTOPERATIVE DIAGNOSIS: Left wrist extensor tenosynovitis   PROCEDURE: Left wrist tenosynovectomy of 2nd and 3rd dorsal compartments   SURGEON:  Franky Curia, M.D.   ASSISTANT: Arley Curia, MD   ANESTHESIA:  General with regional   INTRAVENOUS FLUIDS:  Per anesthesia flow sheet.   ESTIMATED BLOOD LOSS:  Minimal.   COMPLICATIONS:  None.   SPECIMENS: Left wrist tenosynovium to pathology   TOURNIQUET TIME:    Total Tourniquet Time Documented: Upper Arm (Left) - 49 minutes Total: Upper Arm (Left) - 49 minutes    DISPOSITION:  Stable to PACU.   INDICATIONS: 89 year old female with left wrist tenosynovitis.  This is bothersome to her.  She wishes to proceed with operative tenosynovectomy.  She also has some prominence volarly which is bothersome and wishes to potentially have Lexer tenosynovitis to me as necessary.  Risks, benefits and alternatives of surgery were discussed including the risks of blood loss, infection, damage to nerves, vessels, tendons, ligaments, bone for surgery, need for additional surgery, complications with wound healing, continued pain, stiffness, , recurrence.  She voiced understanding of these risks and elected to proceed.  OPERATIVE COURSE:  After being identified preoperatively by myself,  the patient and I agreed on the procedure and site of the procedure.  The surgical site was marked.  Surgical consent had been signed. Preoperative IV antibiotic prophylaxis was given. She was transferred to the operating room and placed on the operating table in supine position with the left upper extremity on an arm board.  General anesthesia was induced by the anesthesiologist. A regional  block had been performed by anesthesia in preoperative holding.    Left upper extremity was prepped and draped in normal sterile orthopedic fashion.  A surgical pause was performed between the surgeons, anesthesia, and operating room staff and all were in agreement as to the patient, procedure, and site of procedure.  Tourniquet at the proximal aspect of the extremity was inflated to 250 mmHg after exsanguination of the arm with an Esmarch bandage.  Incision was made over the boggy swollen area in the dorsal radial aspect of the wrist.  This was carried into subcutaneous tissues by spreading technique.  Bipolar electrocautery was used to obtain hemostasis.  The tenosynovium surrounding the second dorsal compartment tendons was enlarged.  This was freed up.  It was removed from the tendons and surrounding soft tissue adhesions.  The retinaculum was opened to allow removal of the tenosynovium.  This was done with scissors and rongeurs.  The tenosynovium was removed in its entirety and sent to pathology for examination.  The third dorsal compartment was examined.  The EPL tendon had tenosynovium surrounding it distally.  This was also freed up from surrounding soft tissues and removed from the tendon with the scissors and rongeurs.  The deep branch of the radial artery was identified and protected.  There was a dorsal branch of radial nerve which was protected throughout the case.  The tenosynovium from the second dorsal compartment tendons went down toward the wrist joint.  This was debrided to remove is much tenosynovium as possible.  The tenosynovium from both 2nd and 3rd dorsal compartments was sent to pathology for examination.  The wound was copiously irrigated with sterile  saline.  The first dorsal compartment tendons were examined.  There did not appear to be tenosynovium surrounding these or going into the first dorsal compartment.  Incision was then made on the volar radial aspect of the wrist where there was  boggy swelling preoperatively.  This was carried in subcutaneous tissues by spreading technique.  The radial artery was identified and protected.  The FCR tendon sheath was opened.  There was no inflamed tenosynovium within the sheath.  The wounds were irrigated with sterile saline.  The retinaculum to the second dorsal compartment was repaired with a 4-0 Vicryl suture.  Inverted interrupted Vicryl suture was placed in the subcutaneous tissues and skin was closed with 4-0 nylon in a horizontal mattress fashion.  Wounds were dressed with sterile Xeroform and 4 x 4's and wrapped with a Kerlix bandage.  Volar splint was placed and wrapped with Kerlix and Ace bandage.  The tourniquet was deflated at 49 minutes.  Fingertips were pink with brisk capillary refill after deflation of tourniquet.  The operative  drapes were broken down.  The patient was awoken from anesthesia safely.  She was transferred back to the stretcher and taken to PACU in stable condition.  I will see her back in the office in 1 week for postoperative followup.  I will give her a prescription for Norco 5/325 1 tab PO q6 hours prn pain, dispense #15.   Mabel Roll, MD Electronically signed, 03/14/2024

## 2024-03-14 NOTE — Anesthesia Procedure Notes (Addendum)
 Procedure Name: LMA Insertion Date/Time: 03/14/2024 9:21 AM  Performed by: Emilio Rock BIRCH, CRNAPre-anesthesia Checklist: Patient identified, Emergency Drugs available, Suction available and Patient being monitored Patient Re-evaluated:Patient Re-evaluated prior to induction Oxygen Delivery Method: Circle system utilized Preoxygenation: Pre-oxygenation with 100% oxygen Induction Type: IV induction Ventilation: Mask ventilation without difficulty LMA: LMA inserted LMA Size: 5.0 Number of attempts: 1 Airway Equipment and Method: Bite block Placement Confirmation: positive ETCO2 Tube secured with: Tape Dental Injury: Teeth and Oropharynx as per pre-operative assessment

## 2024-03-14 NOTE — Op Note (Signed)
 I assisted Surgeons and Role:    * Murrell Drivers, MD - Primary    * Murrell Kuba, MD - Assisting on the Procedures: TENOSYNOVECTOMY on 03/14/2024.  I provided assistance on this case as follows: Set up, approach, identification protection of the radial nerve and radial artery, identification of the tenosynovium perforation of the 1st, 2nd and 3rd dorsal compartments, tenosynovectomy of large tenosynovial perforation, closure of the wound and application of the dressing and splint.  Electronically signed by: Kuba Murrell, MD Date: 03/14/2024 Time: 10:27 AM

## 2024-03-14 NOTE — H&P (Signed)
 [name] is an 89 y.o. female.   Chief Complaint: tenosynovitis HPI: 89 yo female with left wrist tenosynovitis.  It is bothersome to her.  She wishes to proceed with surgical tenosynovectomy.  Allergies: Allergies[1]  Past Medical History:  Diagnosis Date   Acalculous cholecystitis 08/24/2018   Arthritis    DVT of lower extremity (deep venous thrombosis) (HCC)    right leg - after back surgery in 2019   GERD (gastroesophageal reflux disease)    History of blood transfusion    Hypothyroidism    PONV (postoperative nausea and vomiting)    Ptosis of both eyelids    Vertigo    Wears glasses    reading    Past Surgical History:  Procedure Laterality Date   ABDOMINAL HYSTERECTOMY  1986   APPLICATION OF ROBOTIC ASSISTANCE FOR SPINAL PROCEDURE N/A 07/17/2017   Procedure: APPLICATION OF ROBOTIC ASSISTANCE FOR SPINAL PROCEDURE;  Surgeon: Colon Shove, MD;  Location: [facility] OR;  Service: Neurosurgery;  Laterality: N/A;   APPLICATION OF ROBOTIC ASSISTANCE FOR SPINAL PROCEDURE N/A 09/01/2017   Procedure: APPLICATION OF ROBOTIC ASSISTANCE FOR SPINAL PROCEDURE;  Surgeon: Colon Shove, MD;  Location: [facility] OR;  Service: Neurosurgery;  Laterality: N/A;   APPLICATION OF ROBOTIC ASSISTANCE FOR SPINAL PROCEDURE N/A 10/27/2020   Procedure: APPLICATION OF ROBOTIC ASSISTANCE FOR SPINAL PROCEDURE;  Surgeon: Colon Shove, MD;  Location: [facility] OR;  Service: Neurosurgery;  Laterality: N/A;   BACK SURGERY  1986   lumb lam   BREAST BIOPSY Right 2011   CARPAL TUNNEL RELEASE  2000   right   CARPAL TUNNEL RELEASE  2012   left   CHOLECYSTECTOMY N/A 08/24/2018   Procedure: LAPAROSCOPIC CHOLECYSTECTOMY WITH INTRAOPERATIVE CHOLANGIOGRAM;  Surgeon: Gail Favorite, MD;  Location: [facility];  Service: General;  Laterality: N/A;   COLONOSCOPY     CYSTOCELE REPAIR  2007   sling   DISTAL INTERPHALANGEAL JOINT FUSION Right 02/04/2014   Procedure: FUSION DISTAL INTERPHALANGEAL JOINT RIGHT INDEX FINGER ;  Surgeon:  Arley Curia, MD;  Location: [facility];  Service: Orthopedics;  Laterality: Right;   EYE SURGERY Bilateral    Cataract surgery with lens implant   KNEE SURGERY  2009,2011   partial knee   LAPAROSCOPY N/A 08/24/2018   Procedure: LAPAROSCOPY DIAGNOSTIC;  Surgeon: Gail Favorite, MD;  Location: [facility] OR;  Service: General;  Laterality: N/A;   LUMBAR PERCUTANEOUS PEDICLE SCREW 4 LEVEL N/A 10/27/2020   Procedure: Thoracic nine-ten Laminectomy with extension of fusion from Thoracic ten to Thoracic six with segmental fixation (cemented) with pedicle screw and mazor;  Surgeon: Colon Shove, MD;  Location: [facility] OR;  Service: Neurosurgery;  Laterality: N/A;   OPEN REDUCTION INTERNAL FIXATION (ORIF) PROXIMAL PHALANX Left 08/30/2013   Procedure: OPEN REDUCTION INTERNAL FIXATION (ORIF) PROXIMAL PHALANX FRACTURE LEFT SMALL FINGER; SPLINT RING FINGER;  Surgeon: Arley JONELLE Curia, MD;  Location: [facility];  Service: Orthopedics;  Laterality: Left;   POSTERIOR LUMBAR FUSION 4 LEVEL N/A 07/17/2017   Procedure: Thoracic Ten - Lumbar Five revison of hardware with Mazor;  Surgeon: Colon Shove, MD;  Location: [facility] OR;  Service: Neurosurgery;  Laterality: N/A;  Thoracic/Lumbar   PTOSIS REPAIR Bilateral 07/27/2015   Procedure: PTOSIS REPAIR;  Surgeon: Elna Pick, MD;  Location:  SURGERY CENTER;  Service: Plastics;  Laterality: Bilateral;   SHOULDER SURGERY     rt rcr,and lt   TENOLYSIS Left 05/21/2021   Procedure: RELEASE EXTENSOR POLLICIS LONGUS LEFT;  Surgeon: Curia Arley, MD;  Location: [facility];  Service: Orthopedics;  Laterality: Left;   TENOLYSIS Left 06/08/2021   Procedure: RELEASE EXTENSOR POLLICIS LONGUS LEFT;  Surgeon: Murrell Kuba, MD;  Location: [facility];  Service: Orthopedics;  Laterality: Left;   TONSILLECTOMY     TOTAL KNEE ARTHROPLASTY Right 12/23/2019   Procedure: TOTAL KNEE ARTHROPLASTY;  Surgeon: Rubie Kemps, MD;  Location: [facility]  ORS;  Service: Orthopedics;  Laterality: Right;   TRIGGER FINGER RELEASE Left 05/21/2021   Procedure: RELEASE TRIGGER FINGER/A-1 PULLEY LEFT MIDDLE FINGER;  Surgeon: Murrell Kuba, MD;  Location: [facility];  Service: Orthopedics;  Laterality: Left;   TRIGGER FINGER RELEASE Left 06/08/2021   Procedure: RELEASE A-1 PULLEY LEFT MIDDLE FINGER;  Surgeon: Murrell Kuba, MD;  Location: [facility];  Service: Orthopedics;  Laterality: Left;    Family History: Family History  Problem Relation Age of Onset   Diabetes Sister    Breast cancer Neg Hx     Social History:   reports that she has quit smoking. She has never used smokeless tobacco. She reports that she does not drink alcohol  and does not use drugs.  Medications: Medications Prior to Admission  Medication Sig Dispense Refill   b complex vitamins capsule Take 1 capsule by mouth daily.     diphenhydramine -acetaminophen  (TYLENOL  PM) 25-500 MG TABS tablet Take 2 tablets by mouth at bedtime.     ferrous sulfate  325 (65 FE) MG tablet Take 325 mg by mouth daily with breakfast.     levothyroxine  (SYNTHROID , LEVOTHROID) 50 MCG tablet Take 50 mcg by mouth daily before breakfast.      Multiple Vitamin (MULTIVITAMIN WITH MINERALS) TABS tablet Take 1 tablet by mouth at bedtime.     omeprazole (PRILOSEC) 20 MG capsule Take 20 mg by mouth in the morning.     OVER THE COUNTER MEDICATION Take 1 capsule by mouth daily. Lemon Flavonoid     Polyethyl Glycol-Propyl Glycol (SYSTANE OP) Place 1 drop into both eyes 2 (two) times daily as needed (dry eyes).     Sodium Hyaluronate, oral, (HYALURONIC ACID PO) Take 1 capsule by mouth daily. Injuv Form     Vitamins-Lipotropics (B-50 PO) Take 1 tablet by mouth at bedtime.      No results found for this or any previous visit (from the past 48 hours).  No results found.    Blood pressure 131/67, pulse 66, temperature 97.7 F (36.5 C), resp. rate 20, height 5' 6 (1.676 m), weight 76  kg, SpO2 97%.  General appearance: alert, cooperative, and appears stated age Head: Normocephalic, without obvious abnormality, atraumatic Neck: supple, symmetrical, trachea midline Extremities: Intact sensation and capillary refill all digits.  +epl/fpl/io.  No wounds. Mass at radial side of left wrist both volar and dorsal. Skin: Skin color, texture, turgor normal. No rashes or lesions Neurologic: Grossly normal Incision/Wound: none  Assessment/Plan Left wrist tenosynovitis.  Plan tenosynovectomy of extensor and possibly flexor tendons.  Non operative and operative treatment options have been discussed with the patient and patient wishes to proceed with operative treatment. Risks, benefits, and alternatives of surgery have been discussed and the patient agrees with the plan of care.   Sandie Swayze 03/14/2024, 8:39 AM     [1]  Allergies Allergen Reactions   Keflex [Cephalexin] Hives

## 2024-03-14 NOTE — Progress Notes (Signed)
 Assisted Dr. Marney Needle with left, axillary, ultrasound guided block. Side rails up, monitors on throughout procedure. See vital signs in flow sheet. Tolerated Procedure well.

## 2024-03-14 NOTE — Transfer of Care (Signed)
 Immediate Anesthesia Transfer of Care Note  Patient: [name]  Procedure(s) Performed: TENOSYNOVECTOMY (Left: Wrist)  Patient Location: PACU  Anesthesia Type:General  Level of Consciousness: awake, alert , oriented, drowsy, and patient cooperative  Airway & Oxygen Therapy: Patient Spontanous Breathing and Patient connected to face mask oxygen  Post-op Assessment: Report given to RN and Post -op Vital signs reviewed and stable  Post vital signs: Reviewed and stable  Last Vitals:  Vitals Value Taken Time  BP    Temp    Pulse 63 03/14/24 10:37  Resp    SpO2 98 % 03/14/24 10:37  Vitals shown include unfiled device data.  Last Pain:  Vitals:   03/14/24 0855  PainSc: 0-No pain         Complications: No notable events documented.

## 2024-03-14 NOTE — Discharge Instructions (Addendum)
 [facility] Center Instructions Hand Surgery  Wound Care: Keep your hand elevated above the level of your heart.  Do not allow it to dangle by your side.  Keep the dressing dry and do not remove it unless your doctor advises you to do so.  He will usually change it at the time of your post-op visit.  Moving your fingers is advised to stimulate circulation but will depend on the site of your surgery.  If you have a splint applied, your doctor will advise you regarding movement.  Activity: Do not drive or operate machinery today.  Rest today and then you may return to your normal activity and work as indicated by your physician.  Diet:  Drink liquids today or eat a light diet.  You may resume a regular diet tomorrow.    General expectations: Pain for two to three days. Fingers may become slightly swollen.  Call your doctor if any of the following occur: Severe pain not relieved by pain medication. Elevated temperature. Dressing soaked with blood. Inability to move fingers. White or bluish color to fingers.   Regional Anesthesia Blocks  1. You may not be able to move or feel the blocked extremity after a regional anesthetic block. This may last may last from 3-48 hours after placement, but it will go away. The length of time depends on the medication injected and your individual response to the medication. As the nerves start to wake up, you may experience tingling as the movement and feeling returns to your extremity. If the numbness and inability to move your extremity has not gone away after 48 hours, please call your surgeon.   2. The extremity that is blocked will need to be protected until the numbness is gone and the strength has returned. Because you cannot feel it, you will need to take extra care to avoid injury. Because it may be weak, you may have difficulty moving it or using it. You may not know what position it is in without looking at it while the block is in effect.  3. For  blocks in the legs and feet, returning to weight bearing and walking needs to be done carefully. You will need to wait until the numbness is entirely gone and the strength has returned. You should be able to move your leg and foot normally before you try and bear weight or walk. You will need someone to be with you when you first try to ensure you do not fall and possibly risk injury.  4. Bruising and tenderness at the needle site are common side effects and will resolve in a few days.  5. Persistent numbness or new problems with movement should be communicated to the surgeon or the [facility] [phone] [facility] [phone]).   Post Anesthesia Home Care Instructions  Activity: Get plenty of rest for the remainder of the day. A responsible individual must stay with you for 24 hours following the procedure.  For the next 24 hours, DO NOT: -Drive a car -Advertising copywriter -Drink alcoholic beverages -Take any medication unless instructed by your physician -Make any legal decisions or sign important papers.  Meals: Start with liquid foods such as gelatin or soup. Progress to regular foods as tolerated. Avoid greasy, spicy, heavy foods. If nausea and/or vomiting occur, drink only clear liquids until the nausea and/or vomiting subsides. Call your physician if vomiting continues.  Special Instructions/Symptoms: Your throat may feel dry or sore from the anesthesia or the  breathing tube placed in your throat during surgery. If this causes discomfort, gargle with warm salt water . The discomfort should disappear within 24 hours.  If you had a scopolamine  patch placed behind your ear for the management of post- operative nausea and/or vomiting:  1. The medication in the patch is effective for 72 hours, after which it should be removed.  Wrap patch in a tissue and discard in the trash. Wash hands thoroughly with soap and water . 2. You may remove the patch earlier than  72 hours if you experience unpleasant side effects which may include dry mouth, dizziness or visual disturbances. 3. Avoid touching the patch. Wash your hands with soap and water  after contact with the patch.    *May have Tylenol  today at 2pm*

## 2024-03-14 NOTE — Anesthesia Preprocedure Evaluation (Addendum)
 Anesthesia Evaluation  Patient identified by MRN, date of birth, ID band Patient awake    Reviewed: Allergy & Precautions, H&P , NPO status , Patient's Chart, lab work & pertinent test results  History of Anesthesia Complications (+) PONV and history of anesthetic complications  Airway Mallampati: II  TM Distance: >3 FB Neck ROM: Full    Dental no notable dental hx. (+) Partial Lower, Partial Upper, Dental Advisory Given   Pulmonary former smoker   Pulmonary exam normal breath sounds clear to auscultation       Cardiovascular negative cardio ROS  Rhythm:Regular Rate:Normal     Neuro/Psych negative neurological ROS  negative psych ROS   GI/Hepatic Neg liver ROS,GERD  Medicated,,  Endo/Other  Hypothyroidism    Renal/GU negative Renal ROS  negative genitourinary   Musculoskeletal  (+) Arthritis , Osteoarthritis,    Abdominal   Peds  Hematology  (+) Blood dyscrasia, anemia   Anesthesia Other Findings   Reproductive/Obstetrics negative OB ROS                              Anesthesia Physical Anesthesia Plan  ASA: 2  Anesthesia Plan: General   Post-op Pain Management: Regional block* and Tylenol  PO (pre-op)*   Induction: Intravenous  PONV Risk Score and Plan: 4 or greater and Ondansetron , Dexamethasone , Propofol  infusion and TIVA  Airway Management Planned: LMA  Additional Equipment:   Intra-op Plan:   Post-operative Plan: Extubation in OR  Informed Consent: I have reviewed the patients History and Physical, chart, labs and discussed the procedure including the risks, benefits and alternatives for the proposed anesthesia with the patient or authorized representative who has indicated his/her understanding and acceptance.     Dental advisory given  Plan Discussed with: CRNA  Anesthesia Plan Comments:          Anesthesia Quick Evaluation

## 2024-03-14 NOTE — Anesthesia Procedure Notes (Signed)
 Anesthesia Regional Block: Supraclavicular block   Pre-Anesthetic Checklist: , timeout performed,  Correct Patient, Correct Site, Correct Laterality,  Correct Procedure, Correct Position, site marked,  Risks and benefits discussed,  Pre-op evaluation,  At surgeon's request and post-op pain management  Laterality: Left  Prep: Maximum Sterile Barrier Precautions used, chloraprep       Needles:  Injection technique: Single-shot  Needle Type: Echogenic Stimulator Needle     Needle Length: 5cm  Needle Gauge: 22     Additional Needles:   Procedures:,,,, ultrasound used (permanent image in chart),,    Narrative:  Start time: 03/14/2024 8:50 AM End time: 03/14/2024 9:00 AM Injection made incrementally with aspirations every 5 mL.  Performed by: Personally  Anesthesiologist: Epifanio Fallow, MD  Additional Notes:

## 2024-03-15 ENCOUNTER — Encounter (HOSPITAL_BASED_OUTPATIENT_CLINIC_OR_DEPARTMENT_OTHER): Payer: Self-pay | Admitting: Orthopedic Surgery

## 2024-03-15 LAB — SURGICAL PATHOLOGY

## 2024-03-26 ENCOUNTER — Ambulatory Visit: Admitting: Physical Therapy

## 2024-04-03 ENCOUNTER — Encounter: Payer: Self-pay | Admitting: Physical Therapy

## 2024-04-03 ENCOUNTER — Ambulatory Visit: Admitting: Physical Therapy

## 2024-04-03 DIAGNOSIS — M5417 Radiculopathy, lumbosacral region: Secondary | ICD-10-CM

## 2024-04-03 DIAGNOSIS — R2681 Unsteadiness on feet: Secondary | ICD-10-CM

## 2024-04-03 DIAGNOSIS — R262 Difficulty in walking, not elsewhere classified: Secondary | ICD-10-CM

## 2024-04-03 DIAGNOSIS — M6281 Muscle weakness (generalized): Secondary | ICD-10-CM

## 2024-04-03 NOTE — Therapy (Signed)
 " OUTPATIENT PHYSICAL THERAPY THORACOLUMBAR TREATMENT   Patient Name: [name] MRN: [id number] DOB:1932-06-03, 89 y.o., female Today's Date: 04/03/2024  END OF SESSION:  PT End of Session - 04/03/24 1429     Visit Number 7    Date for Recertification  04/03/24    PT Start Time 1430    PT Stop Time 1515    PT Time Calculation (min) 45 min    Activity Tolerance Patient tolerated treatment well    Behavior During Therapy [facility] for tasks assessed/performed          Past Medical History:  Diagnosis Date   Acalculous cholecystitis 08/24/2018   Arthritis    DVT of lower extremity (deep venous thrombosis) (HCC)    right leg - after back surgery in 2019   GERD (gastroesophageal reflux disease)    History of blood transfusion    Hypothyroidism    PONV (postoperative nausea and vomiting)    Ptosis of both eyelids    Vertigo    Wears glasses    reading   Past Surgical History:  Procedure Laterality Date   ABDOMINAL HYSTERECTOMY  1986   APPLICATION OF ROBOTIC ASSISTANCE FOR SPINAL PROCEDURE N/A 07/17/2017   Procedure: APPLICATION OF ROBOTIC ASSISTANCE FOR SPINAL PROCEDURE;  Surgeon: Colon Shove, MD;  Location: [facility] OR;  Service: Neurosurgery;  Laterality: N/A;   APPLICATION OF ROBOTIC ASSISTANCE FOR SPINAL PROCEDURE N/A 09/01/2017   Procedure: APPLICATION OF ROBOTIC ASSISTANCE FOR SPINAL PROCEDURE;  Surgeon: Colon Shove, MD;  Location: [facility] OR;  Service: Neurosurgery;  Laterality: N/A;   APPLICATION OF ROBOTIC ASSISTANCE FOR SPINAL PROCEDURE N/A 10/27/2020   Procedure: APPLICATION OF ROBOTIC ASSISTANCE FOR SPINAL PROCEDURE;  Surgeon: Colon Shove, MD;  Location: [facility] OR;  Service: Neurosurgery;  Laterality: N/A;   BACK SURGERY  1986   lumb lam   BREAST BIOPSY Right 2011   CARPAL TUNNEL RELEASE  2000   right   CARPAL TUNNEL RELEASE  2012   left   CHOLECYSTECTOMY N/A 08/24/2018   Procedure: LAPAROSCOPIC CHOLECYSTECTOMY WITH INTRAOPERATIVE CHOLANGIOGRAM;  Surgeon: Gail Favorite, MD;  Location: [facility] OR;  Service: General;  Laterality: N/A;   COLONOSCOPY     CYSTOCELE REPAIR  2007   sling   DISTAL INTERPHALANGEAL JOINT FUSION Right 02/04/2014   Procedure: FUSION DISTAL INTERPHALANGEAL JOINT RIGHT INDEX FINGER ;  Surgeon: Arley Curia, MD;  Location: [facility];  Service: Orthopedics;  Laterality: Right;   EYE SURGERY Bilateral    Cataract surgery with lens implant   KNEE SURGERY  2009,2011   partial knee   LAPAROSCOPY N/A 08/24/2018   Procedure: LAPAROSCOPY DIAGNOSTIC;  Surgeon: Gail Favorite, MD;  Location: [facility] OR;  Service: General;  Laterality: N/A;   LUMBAR PERCUTANEOUS PEDICLE SCREW 4 LEVEL N/A 10/27/2020   Procedure: Thoracic nine-ten Laminectomy with extension of fusion from Thoracic ten to Thoracic six with segmental fixation (cemented) with pedicle screw and mazor;  Surgeon: Colon Shove, MD;  Location: [facility] OR;  Service: Neurosurgery;  Laterality: N/A;   OPEN REDUCTION INTERNAL FIXATION (ORIF) PROXIMAL PHALANX Left 08/30/2013   Procedure: OPEN REDUCTION INTERNAL FIXATION (ORIF) PROXIMAL PHALANX FRACTURE LEFT SMALL FINGER; SPLINT RING FINGER;  Surgeon: Arley JONELLE Curia, MD;  Location: [facility];  Service: Orthopedics;  Laterality: Left;   POSTERIOR LUMBAR FUSION 4 LEVEL N/A 07/17/2017   Procedure: Thoracic Ten - Lumbar Five revison of hardware with Mazor;  Surgeon: Colon Shove, MD;  Location: [facility] OR;  Service: Neurosurgery;  Laterality: N/A;  Thoracic/Lumbar   PTOSIS REPAIR Bilateral 07/27/2015   Procedure: PTOSIS REPAIR;  Surgeon: Elna Pick, MD;  Location: [facility];  Service: Plastics;  Laterality: Bilateral;   SHOULDER SURGERY     rt rcr,and lt   TENOLYSIS Left 05/21/2021   Procedure: RELEASE EXTENSOR POLLICIS LONGUS LEFT;  Surgeon: Murrell Kuba, MD;  Location: [facility];  Service: Orthopedics;  Laterality: Left;   TENOLYSIS Left 06/08/2021   Procedure: RELEASE EXTENSOR POLLICIS LONGUS  LEFT;  Surgeon: Murrell Kuba, MD;  Location: [facility];  Service: Orthopedics;  Laterality: Left;   TENOSYNOVECTOMY Left 03/14/2024   Procedure: TENOSYNOVECTOMY;  Surgeon: Murrell Drivers, MD;  Location: [facility];  Service: Orthopedics;  Laterality: Left;  LEFT WRIST EXTENSOR TENOSYNOVECTOMY   TONSILLECTOMY     TOTAL KNEE ARTHROPLASTY Right 12/23/2019   Procedure: TOTAL KNEE ARTHROPLASTY;  Surgeon: Rubie Kemps, MD;  Location: [facility];  Service: Orthopedics;  Laterality: Right;   TRIGGER FINGER RELEASE Left 05/21/2021   Procedure: RELEASE TRIGGER FINGER/A-1 PULLEY LEFT MIDDLE FINGER;  Surgeon: Murrell Kuba, MD;  Location: [facility];  Service: Orthopedics;  Laterality: Left;   TRIGGER FINGER RELEASE Left 06/08/2021   Procedure: RELEASE A-1 PULLEY LEFT MIDDLE FINGER;  Surgeon: Murrell Kuba, MD;  Location: [facility];  Service: Orthopedics;  Laterality: Left;   Patient Active Problem List   Diagnosis Date Noted   Myelopathy concurrent with and due to spinal stenosis of thoracic region (HCC) 10/27/2020   S/P total knee arthroplasty, right 12/23/2019   Acalculous cholecystitis 08/24/2018   Osteoarthritis of right knee    Vertigo    DDD (degenerative disc disease), cervical    Benign essential HTN    History of DVT (deep vein thrombosis)    Tachycardia    Steroid-induced hyperglycemia    Hypothyroidism    Neuropathic pain    Acute blood loss anemia    S/P lumbar fusion    Closed L5 vertebral fracture (HCC) 08/31/2017   Closed fracture of fifth lumbar vertebra (HCC) 08/30/2017   Lumbar vertebral fracture (HCC) 07/17/2017   Scoliosis 06/13/2017    PCP: FABIENE Rummer, MD  REFERRING PROVIDER: Colon, MD  REFERRING DIAG: back pain, neck pain  Rationale for Evaluation and Treatment: Rehabilitation  THERAPY DIAG:  Radiculopathy, lumbosacral region  Muscle weakness (generalized)  Difficulty in walking, not elsewhere  classified  Unsteady gait  ONSET DATE: 01/01/24  SUBJECTIVE:  SUBJECTIVE STATEMENT: Had hand surgery on the 11th, hand is tender today. Ni limitations,  just to be careful  PAtient reports that she saw Dr. Colon the back surgeon, he feels like there really is not pinched nerve.  She reports some concerns of walking and getting around with some neck and headache.    PERTINENT HISTORY:  GERD, spine surgery 2019, 2022 x 2, 1986, R TKA 2021 PAIN:  Are you having pain? Yes: NPRS scale: 0/10 Pain location: low back, left buttock and leg Pain description: ache, shooting Aggravating factors: walking, stairs, pain can be 7/10 Relieving factors: rest pain can be 0/10  PRECAUTIONS: Fall  RED FLAGS: None   WEIGHT BEARING RESTRICTIONS: No  FALLS:  Has patient fallen in last 6 months? No and I just really try to be very careful  LIVING ENVIRONMENT: Lives with: lives with their family Lives in: House/apartment Stairs: Yes: Internal: 15 steps; can reach both Has following equipment at home: Single point cane, Walker - 2 wheeled, shower chair, and Grab bars  OCCUPATION: retired  PLOF: Independent, Independent with household mobility with device, Independent with community mobility with device, and able to drive, can shop and walk into the store with walker, goes up and down stairs to do the laundry, does all the cooking  PATIENT GOALS: better balance, walk better, would like to use the [facility]  NEXT MD VISIT: none scheduled  OBJECTIVE:  Note: Objective measures were completed at Evaluation unless otherwise noted.  DIAGNOSTIC FINDINGS:  CAT scan myelogram  COGNITION: Overall cognitive status: Within functional limits for tasks assessed     SENSATION: WFL  MUSCLE LENGTH: Tight HS, piriformis  and calves  POSTURE: rounded shoulders, forward head, and decreased lumbar lordosis  PALPATION: Tight in the lumbar and the left buttock  LUMBAR ROM:   AROM eval  Flexion Decreased 50%  Extension Decreased 100%  Right lateral flexion   Left lateral flexion   Right rotation   Left rotation    (Blank rows = not tested)  LOWER EXTREMITY ROM:     Active  Right eval  Hip flexion   Hip extension   Hip abduction   Hip adduction   Hip internal rotation   Hip external rotation   Knee flexion   Knee extension   Ankle dorsiflexion 0  Ankle plantarflexion   Ankle inversion 10  Ankle eversion 0   (Blank rows = not tested)  LOWER EXTREMITY MMT:    MMT Right eval Left eval  Hip flexion 4- 4-  Hip extension 4- 3+  Hip abduction 3= 3+  Hip adduction    Hip internal rotation    Hip external rotation    Knee flexion 4 4-  Knee extension 4 4-  Ankle dorsiflexion 2   Ankle plantarflexion 3+   Ankle inversion 3+   Ankle eversion 1    (Blank rows = not tested)  FUNCTIONAL TESTS:  5 times sit to stand: 31 seconds Timed up and go (TUG): 21 seconds with FWW 2 minute walk test: 210 feet with FWW, c/o fatigue in the feet and calves Berg balance: 23/56  GAIT: Distance walked: 200 feet Assistive device utilized: Walker - 2 wheeled Level of assistance: Complete Independence Comments: slow, started to feel fatigue in the ankles and calves  TREATMENT DATE:  04/03/24 Nustep level 5 x 6 minutes Gait with SPC CGA 100 feet x 2 2x5 STS with weighted ball at chest Alt 6in box taps w/ SPC 6 in step  ups 2 rails x10  HS curls 25lb 2x10 Leg Ext 10lb 2x10 Heel raises in RW  03/11/24 Nustep level 5 x 6 minutes Gait with SPC CGA 100 feet x 2 5X STS with weighted ball at chest 3# LAQ 3# marches 3# hip abduction with walker Ball squeeze Feet on ball K2C, rotation, small bridge, isometric abs Blue tband clamshells Discussion on how to use walker at home after her hand surgery,  discussed not using hand to pull on banister and she is going to try to stay down stairs for the first 3 nights, she does have her son coming  03/07/24 Nustep level 5 x 6 minutes Gait w/ SPC ~281ft SBA TUG 14.09 sec Sit to stand 2x10 Alt 6 inch box taps w/ SPC x10, x5  6in step up 1 rail 2x5 Gait no AD 88ft SBA s51me instability  02/20/24 Nustep level 5 x 6 minutes Gait w/ SPC ~253ft some instability Sit to stands x10, x5 some compensation due to weakness and instability. 25# leg curls 2x12 Leg ext 10lb 2x12 Alt 6in box taps w/ SPC 2x5 each CGA to min Hip add ball squeeze 2x10 Side steps at mat HHA x1   02/15/24 Nustep level 5 x 6 minutes Gait No AD, 20 ft x 2  Alt 6in box taps w/ SPC 2x10 Side steps at mat HHA x2 and x1 Standing Rows green 2x15  Sit to stands x10  On airex from elevated mat x10, instability  On airex reaching oside BOS 25# leg curls 2x10 Leg ext 5lb 2x10  02/13/24 Nustep level 5 x 6 minutes Standing red tband row Red tband extension Ball toss and catch 20# leg curls Red tband ankle exercises Passive right ankle stretch HHA side stepping fwd and backward walking Gait with CGA and two SPC's 110 feet 3# LAQ 3# marches Standing 3# hip abduction  Sit to stand with yellow weighted ball   01/30/24 Evaluation Nustep level 5 x 6 mintues                                                                                                                                 PATIENT EDUCATION:  Education details: POC Person educated: Patient Education method: Explanation Education comprehension: verbalized understanding  HOME EXERCISE PROGRAM: TBD  ASSESSMENT:  CLINICAL IMPRESSION: Pt enters doing well with ance wrap on  L hand. The right hip really does have a trendelenberg gait pattern.  Encouragement needed  to complete alt box taps. Pt demo ed good strength with leg curls and extensions. Cues for anterior weight shifts needed with sit to stands. All  interventions completed well without reports of increase pain,   Patient is a 89 y.o. female who was seen today for physical therapy evaluation and treatment for LE weakness, poor balance and some LBP.   She reports that over the past few years she has had more and more difficulty walking, she thought she had  a pinched nerve but MD ruled this out.  Right ankle is very weak, left knee and hip are weak, she is at a very high risk for falls with Berg balance test score being 23/56.  She has to hold onto things to keep her balance  OBJECTIVE IMPAIRMENTS: Abnormal gait, cardiopulmonary status limiting activity, decreased activity tolerance, decreased balance, decreased coordination, decreased endurance, decreased mobility, difficulty walking, decreased ROM, decreased strength, increased muscle spasms, impaired flexibility, improper body mechanics, postural dysfunction, and pain.   REHAB POTENTIAL: Good  CLINICAL DECISION MAKING: Stable/uncomplicated  EVALUATION COMPLEXITY: Low   GOALS: Goals reviewed with patient? Yes  SHORT TERM GOALS: Target date: 02/24/24  Independent with initial hep Baseline: Goal status: met 03/11/24  LONG TERM GOALS: Target date: 04/03/24  Independent with advanced HEP Baseline:  Goal status: INITIAL  2.  Decrease TUG time to 16 seconds Baseline:  Goal status: progressing 03/11/24  3.  Improve Berg balance score to 42/56 Baseline:  Goal status: INITIAL  4.  Be able to walk 300 feet in Baseline:  Goal status: ongoing 03/07/24  5.  Walk 200 feet with SPC and SBA Baseline:  Goal status: Progressing 02/20/24, Met 03/07/24  PLAN:  PT FREQUENCY: 1-2x/week  PT DURATION: 12 weeks  PLANNED INTERVENTIONS: 97164- PT Re-evaluation, 97110-Therapeutic exercises, 97530- Therapeutic activity, 97112- Neuromuscular re-education, 97535- Self Care, [id number]- Manual therapy, [phone]- Gait training, [phone]- Electrical stimulation (unattended), Patient/Family education,  Balance training, Stair training, Taping, Joint mobilization, Cryotherapy, and Moist heat.  PLAN FOR NEXT SESSION: balance, strength, functional gait   Tanda KANDICE Sorrow, PTA 04/03/2024, 2:31 PM  "

## 2024-04-08 ENCOUNTER — Ambulatory Visit: Attending: Neurological Surgery | Admitting: Physical Therapy

## 2024-04-08 ENCOUNTER — Encounter: Payer: Self-pay | Admitting: Physical Therapy

## 2024-04-08 DIAGNOSIS — R2689 Other abnormalities of gait and mobility: Secondary | ICD-10-CM | POA: Insufficient documentation

## 2024-04-08 DIAGNOSIS — R262 Difficulty in walking, not elsewhere classified: Secondary | ICD-10-CM | POA: Diagnosis present

## 2024-04-08 DIAGNOSIS — M6281 Muscle weakness (generalized): Secondary | ICD-10-CM | POA: Insufficient documentation

## 2024-04-08 DIAGNOSIS — M5459 Other low back pain: Secondary | ICD-10-CM | POA: Diagnosis present

## 2024-04-08 DIAGNOSIS — R2681 Unsteadiness on feet: Secondary | ICD-10-CM | POA: Diagnosis present

## 2024-04-08 DIAGNOSIS — M5417 Radiculopathy, lumbosacral region: Secondary | ICD-10-CM | POA: Insufficient documentation

## 2024-04-08 NOTE — Therapy (Signed)
 " OUTPATIENT PHYSICAL THERAPY THORACOLUMBAR TREATMENT   Patient Name: [name] MRN: [id number] DOB:1932/04/10, 89 y.o., female Today's Date: 04/08/2024  END OF SESSION:  PT End of Session - 04/08/24 1441     Visit Number 8    Number of Visits 13    Authorization Type [employer] 8 of 12    PT Start Time 1443    PT Stop Time 1528    PT Time Calculation (min) 45 min    Activity Tolerance Patient tolerated treatment well    Behavior During Therapy WFL for tasks assessed/performed          Past Medical History:  Diagnosis Date   Acalculous cholecystitis 08/24/2018   Arthritis    DVT of lower extremity (deep venous thrombosis) (HCC)    right leg - after back surgery in 2019   GERD (gastroesophageal reflux disease)    History of blood transfusion    Hypothyroidism    PONV (postoperative nausea and vomiting)    Ptosis of both eyelids    Vertigo    Wears glasses    reading   Past Surgical History:  Procedure Laterality Date   ABDOMINAL HYSTERECTOMY  1986   APPLICATION OF ROBOTIC ASSISTANCE FOR SPINAL PROCEDURE N/A 07/17/2017   Procedure: APPLICATION OF ROBOTIC ASSISTANCE FOR SPINAL PROCEDURE;  Surgeon: Colon Shove, MD;  Location: [facility] OR;  Service: Neurosurgery;  Laterality: N/A;   APPLICATION OF ROBOTIC ASSISTANCE FOR SPINAL PROCEDURE N/A 09/01/2017   Procedure: APPLICATION OF ROBOTIC ASSISTANCE FOR SPINAL PROCEDURE;  Surgeon: Colon Shove, MD;  Location: [facility] OR;  Service: Neurosurgery;  Laterality: N/A;   APPLICATION OF ROBOTIC ASSISTANCE FOR SPINAL PROCEDURE N/A 10/27/2020   Procedure: APPLICATION OF ROBOTIC ASSISTANCE FOR SPINAL PROCEDURE;  Surgeon: Colon Shove, MD;  Location: [facility] OR;  Service: Neurosurgery;  Laterality: N/A;   BACK SURGERY  1986   lumb lam   BREAST BIOPSY Right 2011   CARPAL TUNNEL RELEASE  2000   right   CARPAL TUNNEL RELEASE  2012   left   CHOLECYSTECTOMY N/A 08/24/2018   Procedure: LAPAROSCOPIC CHOLECYSTECTOMY WITH INTRAOPERATIVE CHOLANGIOGRAM;   Surgeon: Gail Favorite, MD;  Location: [facility] OR;  Service: General;  Laterality: N/A;   COLONOSCOPY     CYSTOCELE REPAIR  2007   sling   DISTAL INTERPHALANGEAL JOINT FUSION Right 02/04/2014   Procedure: FUSION DISTAL INTERPHALANGEAL JOINT RIGHT INDEX FINGER ;  Surgeon: Arley Curia, MD;  Location: [facility];  Service: Orthopedics;  Laterality: Right;   EYE SURGERY Bilateral    Cataract surgery with lens implant   KNEE SURGERY  2009,2011   partial knee   LAPAROSCOPY N/A 08/24/2018   Procedure: LAPAROSCOPY DIAGNOSTIC;  Surgeon: Gail Favorite, MD;  Location: [facility] OR;  Service: General;  Laterality: N/A;   LUMBAR PERCUTANEOUS PEDICLE SCREW 4 LEVEL N/A 10/27/2020   Procedure: Thoracic nine-ten Laminectomy with extension of fusion from Thoracic ten to Thoracic six with segmental fixation (cemented) with pedicle screw and mazor;  Surgeon: Colon Shove, MD;  Location: [facility] OR;  Service: Neurosurgery;  Laterality: N/A;   OPEN REDUCTION INTERNAL FIXATION (ORIF) PROXIMAL PHALANX Left 08/30/2013   Procedure: OPEN REDUCTION INTERNAL FIXATION (ORIF) PROXIMAL PHALANX FRACTURE LEFT SMALL FINGER; SPLINT RING FINGER;  Surgeon: Arley JONELLE Curia, MD;  Location: [facility];  Service: Orthopedics;  Laterality: Left;   POSTERIOR LUMBAR FUSION 4 LEVEL N/A 07/17/2017   Procedure: Thoracic Ten - Lumbar Five revison of hardware with Mazor;  Surgeon: Colon Shove, MD;  Location:  [facility] OR;  Service: Neurosurgery;  Laterality: N/A;  Thoracic/Lumbar   PTOSIS REPAIR Bilateral 07/27/2015   Procedure: PTOSIS REPAIR;  Surgeon: Elna Pick, MD;  Location: [facility];  Service: Plastics;  Laterality: Bilateral;   SHOULDER SURGERY     rt rcr,and lt   TENOLYSIS Left 05/21/2021   Procedure: RELEASE EXTENSOR POLLICIS LONGUS LEFT;  Surgeon: Murrell Kuba, MD;  Location: [facility];  Service: Orthopedics;  Laterality: Left;   TENOLYSIS Left 06/08/2021   Procedure: RELEASE EXTENSOR  POLLICIS LONGUS LEFT;  Surgeon: Murrell Kuba, MD;  Location: [facility];  Service: Orthopedics;  Laterality: Left;   TENOSYNOVECTOMY Left 03/14/2024   Procedure: TENOSYNOVECTOMY;  Surgeon: Murrell Drivers, MD;  Location: [facility];  Service: Orthopedics;  Laterality: Left;  LEFT WRIST EXTENSOR TENOSYNOVECTOMY   TONSILLECTOMY     TOTAL KNEE ARTHROPLASTY Right 12/23/2019   Procedure: TOTAL KNEE ARTHROPLASTY;  Surgeon: Rubie Kemps, MD;  Location: [facility];  Service: Orthopedics;  Laterality: Right;   TRIGGER FINGER RELEASE Left 05/21/2021   Procedure: RELEASE TRIGGER FINGER/A-1 PULLEY LEFT MIDDLE FINGER;  Surgeon: Murrell Kuba, MD;  Location: [facility];  Service: Orthopedics;  Laterality: Left;   TRIGGER FINGER RELEASE Left 06/08/2021   Procedure: RELEASE A-1 PULLEY LEFT MIDDLE FINGER;  Surgeon: Murrell Kuba, MD;  Location:  SURGERY CENTER;  Service: Orthopedics;  Laterality: Left;   Patient Active Problem List   Diagnosis Date Noted   Myelopathy concurrent with and due to spinal stenosis of thoracic region (HCC) 10/27/2020   S/P total knee arthroplasty, right 12/23/2019   Acalculous cholecystitis 08/24/2018   Osteoarthritis of right knee    Vertigo    DDD (degenerative disc disease), cervical    Benign essential HTN    History of DVT (deep vein thrombosis)    Tachycardia    Steroid-induced hyperglycemia    Hypothyroidism    Neuropathic pain    Acute blood loss anemia    S/P lumbar fusion    Closed L5 vertebral fracture (HCC) 08/31/2017   Closed fracture of fifth lumbar vertebra (HCC) 08/30/2017   Lumbar vertebral fracture (HCC) 07/17/2017   Scoliosis 06/13/2017    PCP: FABIENE Rummer, MD  REFERRING PROVIDER: Colon, MD  REFERRING DIAG: back pain, neck pain  Rationale for Evaluation and Treatment: Rehabilitation  THERAPY DIAG:  Muscle weakness (generalized)  Radiculopathy, lumbosacral region  Difficulty in walking, not elsewhere  classified  Unsteady gait  Other low back pain  ONSET DATE: 01/01/24  SUBJECTIVE:  SUBJECTIVE STATEMENT: Had hand surgery on the 11th, hand is tender today. She feels that she did not do well due to the surgery on her hand and had to miss visits.  PAtient reports that she saw Dr. Colon the back surgeon, he feels like there really is not pinched nerve.  She reports some concerns of walking and getting around with some neck and headache.    PERTINENT HISTORY:  GERD, spine surgery 2019, 2022 x 2, 1986, R TKA 2021 PAIN:  Are you having pain? Yes: NPRS scale: 0/10 Pain location: low back, left buttock and leg Pain description: ache, shooting Aggravating factors: walking, stairs, pain can be 7/10 Relieving factors: rest pain can be 0/10  PRECAUTIONS: Fall  RED FLAGS: None   WEIGHT BEARING RESTRICTIONS: No  FALLS:  Has patient fallen in last 6 months? No and I just really try to be very careful  LIVING ENVIRONMENT: Lives with: lives with their family Lives in: House/apartment Stairs: Yes: Internal: 15 steps; can reach both Has following equipment at home: Single point cane, Walker - 2 wheeled, shower chair, and Grab bars  OCCUPATION: retired  PLOF: Independent, Independent with household mobility with device, Independent with community mobility with device, and able to drive, can shop and walk into the store with walker, goes up and down stairs to do the laundry, does all the cooking  PATIENT GOALS: better balance, walk better, would like to use the [facility] again  NEXT MD VISIT: none scheduled  OBJECTIVE:  Note: Objective measures were completed at Evaluation unless otherwise noted.  DIAGNOSTIC FINDINGS:  CAT scan myelogram  COGNITION: Overall cognitive status: Within functional limits for  tasks assessed     SENSATION: WFL  MUSCLE LENGTH: Tight HS, piriformis and calves  POSTURE: rounded shoulders, forward head, and decreased lumbar lordosis  PALPATION: Tight in the lumbar and the left buttock  LUMBAR ROM:   AROM eval  Flexion Decreased 50%  Extension Decreased 100%  Right lateral flexion   Left lateral flexion   Right rotation   Left rotation    (Blank rows = not tested)  LOWER EXTREMITY ROM:     Active  Right eval  Hip flexion   Hip extension   Hip abduction   Hip adduction   Hip internal rotation   Hip external rotation   Knee flexion   Knee extension   Ankle dorsiflexion 0  Ankle plantarflexion   Ankle inversion 10  Ankle eversion 0   (Blank rows = not tested)  LOWER EXTREMITY MMT:    MMT Right eval Left eval  Hip flexion 4- 4-  Hip extension 4- 3+  Hip abduction 3= 3+  Hip adduction    Hip internal rotation    Hip external rotation    Knee flexion 4 4-  Knee extension 4 4-  Ankle dorsiflexion 2   Ankle plantarflexion 3+   Ankle inversion 3+   Ankle eversion 1    (Blank rows = not tested)  FUNCTIONAL TESTS:  5 times sit to stand: 31 seconds Timed up and go (TUG): 21 seconds with FWW 2 minute walk test: 210 feet with FWW, c/o fatigue in the feet and calves Berg balance: 23/56  GAIT: Distance walked: 200 feet Assistive device utilized: Walker - 2 wheeled Level of assistance: Complete Independence Comments: slow, started to feel fatigue in the ankles and calves  TREATMENT DATE:  04/08/24 Nustep level 5 x 6 minutes TUG 17 seconds with FWW was 15 seconds prior to surgery  TUG with SPC 22 seconds 300 feet 3# marches, abduction, extension and heel raises Yellow right ankle tband all motions Feet on ball K2C, rotation, bridge, isometric abs Blue tband clamshells hook lying Ball b/n knees squeeze Passive stretch HS and piriformis  04/03/24 Nustep level 5 x 6 minutes Gait with SPC CGA 100 feet x 2 2x5 STS with  weighted ball at chest Alt 6in box taps w/ SPC 6 in step ups 2 rails x10  HS curls 25lb 2x10 Leg Ext 10lb 2x10 Heel raises in RW  03/11/24 Nustep level 5 x 6 minutes Gait with SPC CGA 100 feet x 2 5X STS with weighted ball at chest 3# LAQ 3# marches 3# hip abduction with walker Ball squeeze Feet on ball K2C, rotation, small bridge, isometric abs Blue tband clamshells Discussion on how to use walker at home after her hand surgery, discussed not using hand to pull on banister and she is going to try to stay down stairs for the first 3 nights, she does have her son coming  03/07/24 Nustep level 5 x 6 minutes Gait w/ SPC ~23ft SBA TUG 14.09 sec Sit to stand 2x10 Alt 6 inch box taps w/ SPC x10, x5  6in step up 1 rail 2x5 Gait no AD 19ft SBA s49me instability  02/20/24 Nustep level 5 x 6 minutes Gait w/ SPC ~268ft some instability Sit to stands x10, x5 some compensation due to weakness and instability. 25# leg curls 2x12 Leg ext 10lb 2x12 Alt 6in box taps w/ SPC 2x5 each CGA to min Hip add ball squeeze 2x10 Side steps at mat HHA x1   02/15/24 Nustep level 5 x 6 minutes Gait No AD, 20 ft x 2  Alt 6in box taps w/ SPC 2x10 Side steps at mat HHA x2 and x1 Standing Rows green 2x15  Sit to stands x10  On airex from elevated mat x10, instability  On airex reaching oside BOS 25# leg curls 2x10 Leg ext 5lb 2x10  02/13/24 Nustep level 5 x 6 minutes Standing red tband row Red tband extension Ball toss and catch 20# leg curls Red tband ankle exercises Passive right ankle stretch HHA side stepping fwd and backward walking Gait with CGA and two SPC's 110 feet 3# LAQ 3# marches Standing 3# hip abduction  Sit to stand with yellow weighted ball   01/30/24 Evaluation Nustep level 5 x 6 mintues                                                                                                                                 PATIENT EDUCATION:  Education details: POC Person  educated: Patient Education method: Explanation Education comprehension: verbalized understanding  HOME EXERCISE PROGRAM: TBD  ASSESSMENT:  CLINICAL IMPRESSION: Patient missed a number of appointments due to having surgery on the wrist. She was out a little over 3 weeks so we did not see her for the number of visits  approved.  The right hip really does have a trendelenberg gait pattern.  Her TUG went from 21 seconds at eval to 15 seconds prior to the wrist surgery to now at 17 seconds, her 2 minute walk test improved from 210 feet to 300 feet. Patient very fearful with SPC and or with balance activities.  Cues for anterior weight shifts needed with sit to stands. All interventions completed well without reports of increase pain,   Patient is a 89 y.o. female who was seen today for physical therapy evaluation and treatment for LE weakness, poor balance and some LBP.   She reports that over the past few years she has had more and more difficulty walking, she thought she had a pinched nerve but MD ruled this out.  Right ankle is very weak, left knee and hip are weak, she is at a very high risk for falls with Berg balance test score being 23/56.  She has to hold onto things to keep her balance  OBJECTIVE IMPAIRMENTS: Abnormal gait, cardiopulmonary status limiting activity, decreased activity tolerance, decreased balance, decreased coordination, decreased endurance, decreased mobility, difficulty walking, decreased ROM, decreased strength, increased muscle spasms, impaired flexibility, improper body mechanics, postural dysfunction, and pain.   REHAB POTENTIAL: Good  CLINICAL DECISION MAKING: Stable/uncomplicated  EVALUATION COMPLEXITY: Low   GOALS: Goals reviewed with patient? Yes  SHORT TERM GOALS: Target date: 02/24/24  Independent with initial hep Baseline: Goal status: met 03/11/24  LONG TERM GOALS: Target date: 04/03/24  Independent with advanced HEP Baseline:  Goal status:  progressing 04/08/24  2.  Decrease TUG time to 16 seconds Baseline:  Goal status: progressing 04/08/24  3.  Improve Berg balance score to 42/56 Baseline:  Goal status:progressing 04/08/24  4.  Be able to walk 300 feet in Baseline:  Goal status: met 04/08/24  5.  Walk 200 feet with SPC and SBA Baseline:  Goal status: Progressing 02/20/24, Met 03/07/24  PLAN:  PT FREQUENCY: 1-2x/week  PT DURATION: 12 weeks  PLANNED INTERVENTIONS: 97164- PT Re-evaluation, 97110-Therapeutic exercises, 97530- Therapeutic activity, 97112- Neuromuscular re-education, 97535- Self Care, 02859- Manual therapy, [phone]- Gait training, [phone]- Electrical stimulation (unattended), Patient/Family education, Balance training, Stair training, Taping, Joint mobilization, Cryotherapy, and Moist heat.  PLAN FOR NEXT SESSION: balance, strength, functional gait, will have to request new visits   [name], PT 04/08/2024, 2:45 PM  "

## 2024-04-23 ENCOUNTER — Ambulatory Visit: Admitting: Physical Therapy

## 2024-04-23 ENCOUNTER — Encounter: Payer: Self-pay | Admitting: Physical Therapy

## 2024-04-23 DIAGNOSIS — R2681 Unsteadiness on feet: Secondary | ICD-10-CM

## 2024-04-23 DIAGNOSIS — M5417 Radiculopathy, lumbosacral region: Secondary | ICD-10-CM

## 2024-04-23 DIAGNOSIS — M5459 Other low back pain: Secondary | ICD-10-CM

## 2024-04-23 DIAGNOSIS — M6281 Muscle weakness (generalized): Secondary | ICD-10-CM | POA: Diagnosis not present

## 2024-04-23 DIAGNOSIS — R2689 Other abnormalities of gait and mobility: Secondary | ICD-10-CM

## 2024-04-23 DIAGNOSIS — R262 Difficulty in walking, not elsewhere classified: Secondary | ICD-10-CM

## 2024-04-23 NOTE — Therapy (Signed)
 " OUTPATIENT PHYSICAL THERAPY THORACOLUMBAR TREATMENT   Patient Name: [name] MRN: [id number] DOB:1933-03-25, 89 y.o., female Today's Date: 04/23/2024  END OF SESSION:  PT End of Session - 04/23/24 1609     Visit Number 9    Number of Visits 13    Date for Recertification  05/08/24    Authorization Type [employer] 1 of 4    PT Start Time 1610    PT Stop Time 1655    PT Time Calculation (min) 45 min    Activity Tolerance Patient tolerated treatment well    Behavior During Therapy WFL for tasks assessed/performed          Past Medical History:  Diagnosis Date   Acalculous cholecystitis 08/24/2018   Arthritis    DVT of lower extremity (deep venous thrombosis) (HCC)    right leg - after back surgery in 2019   GERD (gastroesophageal reflux disease)    History of blood transfusion    Hypothyroidism    PONV (postoperative nausea and vomiting)    Ptosis of both eyelids    Vertigo    Wears glasses    reading   Past Surgical History:  Procedure Laterality Date   ABDOMINAL HYSTERECTOMY  1986   APPLICATION OF ROBOTIC ASSISTANCE FOR SPINAL PROCEDURE N/A 07/17/2017   Procedure: APPLICATION OF ROBOTIC ASSISTANCE FOR SPINAL PROCEDURE;  Surgeon: Colon Shove, MD;  Location: [facility] OR;  Service: Neurosurgery;  Laterality: N/A;   APPLICATION OF ROBOTIC ASSISTANCE FOR SPINAL PROCEDURE N/A 09/01/2017   Procedure: APPLICATION OF ROBOTIC ASSISTANCE FOR SPINAL PROCEDURE;  Surgeon: Colon Shove, MD;  Location: [facility] OR;  Service: Neurosurgery;  Laterality: N/A;   APPLICATION OF ROBOTIC ASSISTANCE FOR SPINAL PROCEDURE N/A 10/27/2020   Procedure: APPLICATION OF ROBOTIC ASSISTANCE FOR SPINAL PROCEDURE;  Surgeon: Colon Shove, MD;  Location: [facility] OR;  Service: Neurosurgery;  Laterality: N/A;   BACK SURGERY  1986   lumb lam   BREAST BIOPSY Right 2011   CARPAL TUNNEL RELEASE  2000   right   CARPAL TUNNEL RELEASE  2012   left   CHOLECYSTECTOMY N/A 08/24/2018   Procedure: LAPAROSCOPIC  CHOLECYSTECTOMY WITH INTRAOPERATIVE CHOLANGIOGRAM;  Surgeon: Gail Favorite, MD;  Location: [facility] OR;  Service: General;  Laterality: N/A;   COLONOSCOPY     CYSTOCELE REPAIR  2007   sling   DISTAL INTERPHALANGEAL JOINT FUSION Right 02/04/2014   Procedure: FUSION DISTAL INTERPHALANGEAL JOINT RIGHT INDEX FINGER ;  Surgeon: Arley Curia, MD;  Location: [facility];  Service: Orthopedics;  Laterality: Right;   EYE SURGERY Bilateral    Cataract surgery with lens implant   KNEE SURGERY  2009,2011   partial knee   LAPAROSCOPY N/A 08/24/2018   Procedure: LAPAROSCOPY DIAGNOSTIC;  Surgeon: Gail Favorite, MD;  Location: [facility] OR;  Service: General;  Laterality: N/A;   LUMBAR PERCUTANEOUS PEDICLE SCREW 4 LEVEL N/A 10/27/2020   Procedure: Thoracic nine-ten Laminectomy with extension of fusion from Thoracic ten to Thoracic six with segmental fixation (cemented) with pedicle screw and mazor;  Surgeon: Colon Shove, MD;  Location: [facility] OR;  Service: Neurosurgery;  Laterality: N/A;   OPEN REDUCTION INTERNAL FIXATION (ORIF) PROXIMAL PHALANX Left 08/30/2013   Procedure: OPEN REDUCTION INTERNAL FIXATION (ORIF) PROXIMAL PHALANX FRACTURE LEFT SMALL FINGER; SPLINT RING FINGER;  Surgeon: Arley JONELLE Curia, MD;  Location: [facility];  Service: Orthopedics;  Laterality: Left;   POSTERIOR LUMBAR FUSION 4 LEVEL N/A 07/17/2017   Procedure: Thoracic Ten - Lumbar Five revison of hardware with  Mazor;  Surgeon: Colon Shove, MD;  Location: [facility] OR;  Service: Neurosurgery;  Laterality: N/A;  Thoracic/Lumbar   PTOSIS REPAIR Bilateral 07/27/2015   Procedure: PTOSIS REPAIR;  Surgeon: Elna Pick, MD;  Location: [facility];  Service: Plastics;  Laterality: Bilateral;   SHOULDER SURGERY     rt rcr,and lt   TENOLYSIS Left 05/21/2021   Procedure: RELEASE EXTENSOR POLLICIS LONGUS LEFT;  Surgeon: Murrell Kuba, MD;  Location: [facility];  Service: Orthopedics;  Laterality: Left;    TENOLYSIS Left 06/08/2021   Procedure: RELEASE EXTENSOR POLLICIS LONGUS LEFT;  Surgeon: Murrell Kuba, MD;  Location: [facility];  Service: Orthopedics;  Laterality: Left;   TENOSYNOVECTOMY Left 03/14/2024   Procedure: TENOSYNOVECTOMY;  Surgeon: Murrell Drivers, MD;  Location: [facility];  Service: Orthopedics;  Laterality: Left;  LEFT WRIST EXTENSOR TENOSYNOVECTOMY   TONSILLECTOMY     TOTAL KNEE ARTHROPLASTY Right 12/23/2019   Procedure: TOTAL KNEE ARTHROPLASTY;  Surgeon: Rubie Kemps, MD;  Location: [facility];  Service: Orthopedics;  Laterality: Right;   TRIGGER FINGER RELEASE Left 05/21/2021   Procedure: RELEASE TRIGGER FINGER/A-1 PULLEY LEFT MIDDLE FINGER;  Surgeon: Murrell Kuba, MD;  Location: [facility];  Service: Orthopedics;  Laterality: Left;   TRIGGER FINGER RELEASE Left 06/08/2021   Procedure: RELEASE A-1 PULLEY LEFT MIDDLE FINGER;  Surgeon: Murrell Kuba, MD;  Location: [facility];  Service: Orthopedics;  Laterality: Left;   Patient Active Problem List   Diagnosis Date Noted   Myelopathy concurrent with and due to spinal stenosis of thoracic region (HCC) 10/27/2020   S/P total knee arthroplasty, right 12/23/2019   Acalculous cholecystitis 08/24/2018   Osteoarthritis of right knee    Vertigo    DDD (degenerative disc disease), cervical    Benign essential HTN    History of DVT (deep vein thrombosis)    Tachycardia    Steroid-induced hyperglycemia    Hypothyroidism    Neuropathic pain    Acute blood loss anemia    S/P lumbar fusion    Closed L5 vertebral fracture (HCC) 08/31/2017   Closed fracture of fifth lumbar vertebra (HCC) 08/30/2017   Lumbar vertebral fracture (HCC) 07/17/2017   Scoliosis 06/13/2017    PCP: FABIENE Rummer, MD  REFERRING PROVIDER: Colon, MD  REFERRING DIAG: back pain, neck pain  Rationale for Evaluation and Treatment: Rehabilitation  THERAPY DIAG:  Muscle weakness (generalized)  Radiculopathy,  lumbosacral region  Difficulty in walking, not elsewhere classified  Unsteady gait  Other low back pain  Other abnormalities of gait and mobility  ONSET DATE: 01/01/24  SUBJECTIVE:  SUBJECTIVE STATEMENT: Doing okay, I am taking care of my husband and I get worn out  PAtient reports that she saw Dr. Colon the back surgeon, he feels like there really is not pinched nerve.  She reports some concerns of walking and getting around with some neck and headache.    PERTINENT HISTORY:  GERD, spine surgery 2019, 2022 x 2, 1986, R TKA 2021 PAIN:  Are you having pain? Yes: NPRS scale: 0/10 Pain location: low back, left buttock and leg Pain description: ache, shooting Aggravating factors: walking, stairs, pain can be 7/10 Relieving factors: rest pain can be 0/10  PRECAUTIONS: Fall  RED FLAGS: None   WEIGHT BEARING RESTRICTIONS: No  FALLS:  Has patient fallen in last 6 months? No and I just really try to be very careful  LIVING ENVIRONMENT: Lives with: lives with their family Lives in: House/apartment Stairs: Yes: Internal: 15 steps; can reach both Has following equipment at home: Single point cane, Walker - 2 wheeled, shower chair, and Grab bars  OCCUPATION: retired  PLOF: Independent, Independent with household mobility with device, Independent with community mobility with device, and able to drive, can shop and walk into the store with walker, goes up and down stairs to do the laundry, does all the cooking  PATIENT GOALS: better balance, walk better, would like to use the [facility] again  NEXT MD VISIT: none scheduled  OBJECTIVE:  Note: Objective measures were completed at Evaluation unless otherwise noted.  DIAGNOSTIC FINDINGS:  CAT scan myelogram  COGNITION: Overall cognitive status: Within  functional limits for tasks assessed     SENSATION: WFL  MUSCLE LENGTH: Tight HS, piriformis and calves  POSTURE: rounded shoulders, forward head, and decreased lumbar lordosis  PALPATION: Tight in the lumbar and the left buttock  LUMBAR ROM:   AROM eval  Flexion Decreased 50%  Extension Decreased 100%  Right lateral flexion   Left lateral flexion   Right rotation   Left rotation    (Blank rows = not tested)  LOWER EXTREMITY ROM:     Active  Right eval  Hip flexion   Hip extension   Hip abduction   Hip adduction   Hip internal rotation   Hip external rotation   Knee flexion   Knee extension   Ankle dorsiflexion 0  Ankle plantarflexion   Ankle inversion 10  Ankle eversion 0   (Blank rows = not tested)  LOWER EXTREMITY MMT:    MMT Right eval Left eval  Hip flexion 4- 4-  Hip extension 4- 3+  Hip abduction 3= 3+  Hip adduction    Hip internal rotation    Hip external rotation    Knee flexion 4 4-  Knee extension 4 4-  Ankle dorsiflexion 2   Ankle plantarflexion 3+   Ankle inversion 3+   Ankle eversion 1    (Blank rows = not tested)  FUNCTIONAL TESTS:  5 times sit to stand: 31 seconds Timed up and go (TUG): 21 seconds with FWW 2 minute walk test: 210 feet with FWW, c/o fatigue in the feet and calves Berg balance: 23/56  GAIT: Distance walked: 200 feet Assistive device utilized: Walker - 2 wheeled Level of assistance: Complete Independence Comments: slow, started to feel fatigue in the ankles and calves  TREATMENT DATE:  04/23/24 Nustep level 5 x 6 minutes Leg curls 20# 2x10 5# leg extension 2x10 3# marches 3# hip abduction Yellow tband right ankle Ball b/n knees squeeze Red tband clamshells HHA walking faster  pace Side stepping Backward walking   04/08/24 Nustep level 5 x 6 minutes TUG 17 seconds with FWW was 15 seconds prior to surgery TUG with SPC 22 seconds 300 feet 3# marches, abduction, extension and heel raises Yellow  right ankle tband all motions Feet on ball K2C, rotation, bridge, isometric abs Blue tband clamshells hook lying Ball b/n knees squeeze Passive stretch HS and piriformis  04/03/24 Nustep level 5 x 6 minutes Gait with SPC CGA 100 feet x 2 2x5 STS with weighted ball at chest Alt 6in box taps w/ SPC 6 in step ups 2 rails x10  HS curls 25lb 2x10 Leg Ext 10lb 2x10 Heel raises in RW  03/11/24 Nustep level 5 x 6 minutes Gait with SPC CGA 100 feet x 2 5X STS with weighted ball at chest 3# LAQ 3# marches 3# hip abduction with walker Ball squeeze Feet on ball K2C, rotation, small bridge, isometric abs Blue tband clamshells Discussion on how to use walker at home after her hand surgery, discussed not using hand to pull on banister and she is going to try to stay down stairs for the first 3 nights, she does have her son coming  03/07/24 Nustep level 5 x 6 minutes Gait w/ SPC ~253ft SBA TUG 14.09 sec Sit to stand 2x10 Alt 6 inch box taps w/ SPC x10, x5  6in step up 1 rail 2x5 Gait no AD 59ft SBA s110me instability  02/20/24 Nustep level 5 x 6 minutes Gait w/ SPC ~276ft some instability Sit to stands x10, x5 some compensation due to weakness and instability. 25# leg curls 2x12 Leg ext 10lb 2x12 Alt 6in box taps w/ SPC 2x5 each CGA to min Hip add ball squeeze 2x10 Side steps at mat HHA x1   02/15/24 Nustep level 5 x 6 minutes Gait No AD, 20 ft x 2  Alt 6in box taps w/ SPC 2x10 Side steps at mat HHA x2 and x1 Standing Rows green 2x15  Sit to stands x10  On airex from elevated mat x10, instability  On airex reaching oside BOS 25# leg curls 2x10 Leg ext 5lb 2x10  02/13/24 Nustep level 5 x 6 minutes Standing red tband row Red tband extension Ball toss and catch 20# leg curls Red tband ankle exercises Passive right ankle stretch HHA side stepping fwd and backward walking Gait with CGA and two SPC's 110 feet 3# LAQ 3# marches Standing 3# hip abduction  Sit to stand  with yellow weighted ball   01/30/24 Evaluation Nustep level 5 x 6 mintues                                                                                                                                 PATIENT EDUCATION:  Education details: POC Person educated: Patient Education method: Explanation Education comprehension: verbalized understanding  HOME EXERCISE PROGRAM: TBD  ASSESSMENT:  CLINICAL IMPRESSION: Patient missed a number of appointments due  to having surgery on the wrist. Patient doing okay, really slow and sore.  Left hip weakness and right ankle weakness  Her TUG went from 21 seconds at eval to 15 seconds prior to the wrist surgery to now at 17 seconds, her 2 minute walk test improved from 210 feet to 300 feet. Patient very fearful with SPC and or with balance activities.  Cues for anterior weight shifts needed with sit to stands. All interventions completed well without reports of increase pain,   Patient is a 89 y.o. female who was seen today for physical therapy evaluation and treatment for LE weakness, poor balance and some LBP.   She reports that over the past few years she has had more and more difficulty walking, she thought she had a pinched nerve but MD ruled this out.  Right ankle is very weak, left knee and hip are weak, she is at a very high risk for falls with Berg balance test score being 23/56.  She has to hold onto things to keep her balance  OBJECTIVE IMPAIRMENTS: Abnormal gait, cardiopulmonary status limiting activity, decreased activity tolerance, decreased balance, decreased coordination, decreased endurance, decreased mobility, difficulty walking, decreased ROM, decreased strength, increased muscle spasms, impaired flexibility, improper body mechanics, postural dysfunction, and pain.   REHAB POTENTIAL: Good  CLINICAL DECISION MAKING: Stable/uncomplicated  EVALUATION COMPLEXITY: Low   GOALS: Goals reviewed with patient? Yes  SHORT TERM GOALS:  Target date: 02/24/24  Independent with initial hep Baseline: Goal status: met 03/11/24  LONG TERM GOALS: Target date: 04/03/24  Independent with advanced HEP Baseline:  Goal status: progressing 04/08/24  2.  Decrease TUG time to 16 seconds Baseline:  Goal status: progressing 04/08/24  3.  Improve Berg balance score to 42/56 Baseline:  Goal status:progressing 04/08/24  4.  Be able to walk 300 feet in Baseline:  Goal status: met 04/08/24  5.  Walk 200 feet with SPC and SBA Baseline:  Goal status: Progressing 02/20/24, Met 03/07/24  PLAN:  PT FREQUENCY: 1-2x/week  PT DURATION: 12 weeks  PLANNED INTERVENTIONS: 97164- PT Re-evaluation, 97110-Therapeutic exercises, 97530- Therapeutic activity, 97112- Neuromuscular re-education, 97535- Self Care, [id number]- Manual therapy, [phone]- Gait training, [phone]- Electrical stimulation (unattended), Patient/Family education, Balance training, Stair training, Taping, Joint mobilization, Cryotherapy, and Moist heat.  PLAN FOR NEXT SESSION: balance, strength, functional gait, will have to request new visits   OBADIAH OZELL ORN, PT 04/23/2024, 4:15 PM  "

## 2024-04-30 ENCOUNTER — Ambulatory Visit: Admitting: Physical Therapy

## 2024-05-07 ENCOUNTER — Ambulatory Visit: Admitting: Physical Therapy

## 2024-05-09 ENCOUNTER — Ambulatory Visit: Admitting: Physical Therapy

## 2024-05-09 ENCOUNTER — Encounter: Payer: Self-pay | Admitting: Physical Therapy

## 2024-05-09 DIAGNOSIS — R2681 Unsteadiness on feet: Secondary | ICD-10-CM

## 2024-05-09 DIAGNOSIS — M5417 Radiculopathy, lumbosacral region: Secondary | ICD-10-CM

## 2024-05-09 DIAGNOSIS — M5459 Other low back pain: Secondary | ICD-10-CM

## 2024-05-09 DIAGNOSIS — R262 Difficulty in walking, not elsewhere classified: Secondary | ICD-10-CM

## 2024-05-09 DIAGNOSIS — M6281 Muscle weakness (generalized): Secondary | ICD-10-CM

## 2024-05-09 NOTE — Therapy (Signed)
 " OUTPATIENT PHYSICAL THERAPY THORACOLUMBAR TREATMENT Progress Note Reporting Period 01/29/25 to 05/09/24  See note below for Objective Data and Assessment of Progress/Goals.      Patient Name: [name] MRN: [id number] DOB:06-12-1932, 89 y.o., female Today's Date: 05/09/2024  END OF SESSION:  PT End of Session - 05/09/24 1010     Visit Number 10    Date for Recertification  08/21/24    Authorization Type [employer]    PT Start Time 1005    PT Stop Time 1100    PT Time Calculation (min) 55 min    Activity Tolerance Patient tolerated treatment well    Behavior During Therapy [facility] for tasks assessed/performed          Past Medical History:  Diagnosis Date   Acalculous cholecystitis 08/24/2018   Arthritis    DVT of lower extremity (deep venous thrombosis) (HCC)    right leg - after back surgery in 2019   GERD (gastroesophageal reflux disease)    History of blood transfusion    Hypothyroidism    PONV (postoperative nausea and vomiting)    Ptosis of both eyelids    Vertigo    Wears glasses    reading   Past Surgical History:  Procedure Laterality Date   ABDOMINAL HYSTERECTOMY  1986   APPLICATION OF ROBOTIC ASSISTANCE FOR SPINAL PROCEDURE N/A 07/17/2017   Procedure: APPLICATION OF ROBOTIC ASSISTANCE FOR SPINAL PROCEDURE;  Surgeon: Colon Shove, MD;  Location: [facility] OR;  Service: Neurosurgery;  Laterality: N/A;   APPLICATION OF ROBOTIC ASSISTANCE FOR SPINAL PROCEDURE N/A 09/01/2017   Procedure: APPLICATION OF ROBOTIC ASSISTANCE FOR SPINAL PROCEDURE;  Surgeon: Colon Shove, MD;  Location: [facility] OR;  Service: Neurosurgery;  Laterality: N/A;   APPLICATION OF ROBOTIC ASSISTANCE FOR SPINAL PROCEDURE N/A 10/27/2020   Procedure: APPLICATION OF ROBOTIC ASSISTANCE FOR SPINAL PROCEDURE;  Surgeon: Colon Shove, MD;  Location: [facility] OR;  Service: Neurosurgery;  Laterality: N/A;   BACK SURGERY  1986   lumb lam   BREAST BIOPSY Right 2011   CARPAL TUNNEL RELEASE  2000   right   CARPAL TUNNEL  RELEASE  2012   left   CHOLECYSTECTOMY N/A 08/24/2018   Procedure: LAPAROSCOPIC CHOLECYSTECTOMY WITH INTRAOPERATIVE CHOLANGIOGRAM;  Surgeon: Gail Favorite, MD;  Location: [facility] OR;  Service: General;  Laterality: N/A;   COLONOSCOPY     CYSTOCELE REPAIR  2007   sling   DISTAL INTERPHALANGEAL JOINT FUSION Right 02/04/2014   Procedure: FUSION DISTAL INTERPHALANGEAL JOINT RIGHT INDEX FINGER ;  Surgeon: Arley Curia, MD;  Location: [facility];  Service: Orthopedics;  Laterality: Right;   EYE SURGERY Bilateral    Cataract surgery with lens implant   KNEE SURGERY  2009,2011   partial knee   LAPAROSCOPY N/A 08/24/2018   Procedure: LAPAROSCOPY DIAGNOSTIC;  Surgeon: Gail Favorite, MD;  Location[facility];  Service: General;  Laterality: N/A;   LUMBAR PERCUTANEOUS PEDICLE SCREW 4 LEVEL N/A 10/27/2020   Procedure: Thoracic nine-ten Laminectomy with extension of fusion from Thoracic ten to Thoracic six with segmental fixation (cemented) with pedicle screw and mazor;  Surgeon: Colon Shove, MD;  Location: [facility] OR;  Service: Neurosurgery;  Laterality: N/A;   OPEN REDUCTION INTERNAL FIXATION (ORIF) PROXIMAL PHALANX Left 08/30/2013   Procedure: OPEN REDUCTION INTERNAL FIXATION (ORIF) PROXIMAL PHALANX FRACTURE LEFT SMALL FINGER; SPLINT RING FINGER;  Surgeon: Arley JONELLE Curia, MD;  Location: [facility];  Service: Orthopedics;  Laterality: Left;   POSTERIOR LUMBAR FUSION 4 LEVEL N/A 07/17/2017  Procedure: Thoracic Ten - Lumbar Five revison of hardware with Mazor;  Surgeon: Colon Shove, MD;  Location: [facility] OR;  Service: Neurosurgery;  Laterality: N/A;  Thoracic/Lumbar   PTOSIS REPAIR Bilateral 07/27/2015   Procedure: PTOSIS REPAIR;  Surgeon: Elna Pick, MD;  Location: [facility];  Service: Plastics;  Laterality: Bilateral;   SHOULDER SURGERY     rt rcr,and lt   TENOLYSIS Left 05/21/2021   Procedure: RELEASE EXTENSOR POLLICIS LONGUS LEFT;  Surgeon: Murrell Kuba, MD;   Location: [facility];  Service: Orthopedics;  Laterality: Left;   TENOLYSIS Left 06/08/2021   Procedure: RELEASE EXTENSOR POLLICIS LONGUS LEFT;  Surgeon: Murrell Kuba, MD;  Location: [facility];  Service: Orthopedics;  Laterality: Left;   TENOSYNOVECTOMY Left 03/14/2024   Procedure: TENOSYNOVECTOMY;  Surgeon: Murrell Drivers, MD;  Location: [facility];  Service: Orthopedics;  Laterality: Left;  LEFT WRIST EXTENSOR TENOSYNOVECTOMY   TONSILLECTOMY     TOTAL KNEE ARTHROPLASTY Right 12/23/2019   Procedure: TOTAL KNEE ARTHROPLASTY;  Surgeon: Rubie Kemps, MD;  Location: [facility];  Service: Orthopedics;  Laterality: Right;   TRIGGER FINGER RELEASE Left 05/21/2021   Procedure: RELEASE TRIGGER FINGER/A-1 PULLEY LEFT MIDDLE FINGER;  Surgeon: Murrell Kuba, MD;  Location: [facility];  Service: Orthopedics;  Laterality: Left;   TRIGGER FINGER RELEASE Left 06/08/2021   Procedure: RELEASE A-1 PULLEY LEFT MIDDLE FINGER;  Surgeon: Murrell Kuba, MD;  Location: [facility];  Service: Orthopedics;  Laterality: Left;   Patient Active Problem List   Diagnosis Date Noted   Myelopathy concurrent with and due to spinal stenosis of thoracic region (HCC) 10/27/2020   S/P total knee arthroplasty, right 12/23/2019   Acalculous cholecystitis 08/24/2018   Osteoarthritis of right knee    Vertigo    DDD (degenerative disc disease), cervical    Benign essential HTN    History of DVT (deep vein thrombosis)    Tachycardia    Steroid-induced hyperglycemia    Hypothyroidism    Neuropathic pain    Acute blood loss anemia    S/P lumbar fusion    Closed L5 vertebral fracture (HCC) 08/31/2017   Closed fracture of fifth lumbar vertebra (HCC) 08/30/2017   Lumbar vertebral fracture (HCC) 07/17/2017   Scoliosis 06/13/2017    PCP: FABIENE Rummer, MD  REFERRING PROVIDER: Colon, MD  REFERRING DIAG: back pain, neck pain  Rationale for Evaluation and Treatment:  Rehabilitation  THERAPY DIAG:  Muscle weakness (generalized)  Radiculopathy, lumbosacral region  Difficulty in walking, not elsewhere classified  Unsteady gait  Other low back pain  ONSET DATE: 01/01/24  SUBJECTIVE:  SUBJECTIVE STATEMENT: I am okay, I just have had less walking and more difficulty walking, I really have had more stumbling and difficulty walking, we have had multiple snows here and she has not been able to get in to see us , so we did not see her for the full # of visits.  PAtient reports that she saw Dr. Colon the back surgeon, he feels like there really is not pinched nerve.  She reports some concerns of walking and getting around with some neck and headache.    PERTINENT HISTORY:  GERD, spine surgery 2019, 2022 x 2, 1986, R TKA 2021 PAIN:  Are you having pain? Yes: NPRS scale: 0/10 Pain location: low back, left buttock and leg Pain description: ache, shooting Aggravating factors: walking, stairs, pain can be 7/10 Relieving factors: rest pain can be 0/10  PRECAUTIONS: Fall  RED FLAGS: None   WEIGHT BEARING RESTRICTIONS: No  FALLS:  Has patient fallen in last 6 months? No and I just really try to be very careful  LIVING ENVIRONMENT: Lives with: lives with their family Lives in: House/apartment Stairs: Yes: Internal: 15 steps; can reach both Has following equipment at home: Single point cane, Walker - 2 wheeled, shower chair, and Grab bars  OCCUPATION: retired  PLOF: Independent, Independent with household mobility with device, Independent with community mobility with device, and able to drive, can shop and walk into the store with walker, goes up and down stairs to do the laundry, does all the cooking  PATIENT GOALS: better balance, walk better, would like to use the  [facility]  NEXT MD VISIT: none scheduled  OBJECTIVE:  Note: Objective measures were completed at Evaluation unless otherwise noted.  DIAGNOSTIC FINDINGS:  CAT scan myelogram  COGNITION: Overall cognitive status: Within functional limits for tasks assessed     SENSATION: WFL  MUSCLE LENGTH: Tight HS, piriformis and calves  POSTURE: rounded shoulders, forward head, and decreased lumbar lordosis  PALPATION: Tight in the lumbar and the left buttock  LUMBAR ROM:   AROM eval 05/09/24  Flexion Decreased 50% Decreased  25%  Extension Decreased 100% Decreased  100%  Right lateral flexion    Left lateral flexion    Right rotation    Left rotation     (Blank rows = not tested)  LOWER EXTREMITY ROM:     Active  Right eval Right  05/09/24  Hip flexion    Hip extension    Hip abduction    Hip adduction    Hip internal rotation    Hip external rotation    Knee flexion    Knee extension    Ankle dorsiflexion 0   Ankle plantarflexion    Ankle inversion 10   Ankle eversion 0    (Blank rows = not tested)  LOWER EXTREMITY MMT:    MMT Right eval Left eval  Hip flexion 4- 4-  Hip extension 4- 3+  Hip abduction 3= 3+  Hip adduction    Hip internal rotation    Hip external rotation    Knee flexion 4 4-  Knee extension 4 4-  Ankle dorsiflexion 2   Ankle plantarflexion 3+   Ankle inversion 3+   Ankle eversion 1    (Blank rows = not tested)  FUNCTIONAL TESTS:  5 times sit to stand: 31 seconds Timed up and go (TUG): 21 seconds with FWW 2 minute walk test: 210 feet with FWW, c/o fatigue in the feet and calves Berg balance: 23/56  GAIT: Distance  walked: 200 feet Assistive device utilized: Walker - 2 wheeled Level of assistance: Complete Independence Comments: slow, started to feel fatigue in the ankles and calves  TREATMENT DATE:  05/09/24 Nustep level 5 x 6 minutes TUG 19 seconds with the FWW TUG 23 seconds with SPC Yellow tband DF, inv/eversion Slant board  stretch 20# leg curls x10, 25# 2x10 5# leg extension 5# marches 5# hip abduction Standing with SPC and light HHA 4 toe tocuhes Standing reaching trying to get her to trust her balance and challenge her balance, she is very fearful with this Gait with SPC and light HHA 2x100 feet  04/23/24 Nustep level 5 x 6 minutes Leg curls 20# 2x10 5# leg extension 2x10 3# marches 3# hip abduction Yellow tband right ankle Ball b/n knees squeeze Red tband clamshells HHA walking faster pace Side stepping Backward walking   04/08/24 Nustep level 5 x 6 minutes TUG 17 seconds with FWW was 15 seconds prior to surgery TUG with SPC 22 seconds 300 feet 3# marches, abduction, extension and heel raises Yellow right ankle tband all motions Feet on ball K2C, rotation, bridge, isometric abs Blue tband clamshells hook lying Ball b/n knees squeeze Passive stretch HS and piriformis  04/03/24 Nustep level 5 x 6 minutes Gait with SPC CGA 100 feet x 2 2x5 STS with weighted ball at chest Alt 6in box taps w/ SPC 6 in step ups 2 rails x10  HS curls 25lb 2x10 Leg Ext 10lb 2x10 Heel raises in RW  03/11/24 Nustep level 5 x 6 minutes Gait with SPC CGA 100 feet x 2 5X STS with weighted ball at chest 3# LAQ 3# marches 3# hip abduction with walker Ball squeeze Feet on ball K2C, rotation, small bridge, isometric abs Blue tband clamshells Discussion on how to use walker at home after her hand surgery, discussed not using hand to pull on banister and she is going to try to stay down stairs for the first 3 nights, she does have her son coming  03/07/24 Nustep level 5 x 6 minutes Gait w/ SPC ~256ft SBA TUG 14.09 sec Sit to stand 2x10 Alt 6 inch box taps w/ SPC x10, x5  6in step up 1 rail 2x5 Gait no AD 63ft SBA s56me instability  02/20/24 Nustep level 5 x 6 minutes Gait w/ SPC ~267ft some instability Sit to stands x10, x5 some compensation due to weakness and instability. 25# leg curls 2x12 Leg  ext 10lb 2x12 Alt 6in box taps w/ SPC 2x5 each CGA to min Hip add ball squeeze 2x10 Side steps at mat HHA x1   02/15/24 Nustep level 5 x 6 minutes Gait No AD, 20 ft x 2  Alt 6in box taps w/ SPC 2x10 Side steps at mat HHA x2 and x1 Standing Rows green 2x15  Sit to stands x10  On airex from elevated mat x10, instability  On airex reaching oside BOS 25# leg curls 2x10 Leg ext 5lb 2x10  02/13/24 Nustep level 5 x 6 minutes Standing red tband row Red tband extension Ball toss and catch 20# leg curls Red tband ankle exercises Passive right ankle stretch HHA side stepping fwd and backward walking Gait with CGA and two SPC's 110 feet 3# LAQ 3# marches Standing 3# hip abduction  Sit to stand with yellow weighted ball   01/30/24 Evaluation Nustep level 5 x 6 mintues  PATIENT EDUCATION:  Education details: POC Person educated: Patient Education method: Explanation Education comprehension: verbalized understanding  HOME EXERCISE PROGRAM: TBD  ASSESSMENT:  CLINICAL IMPRESSION: Patient missed a number of appointments due to having surgery on the wrist.  Then recently missed more due to having issues with snow and ice in are locale and could not get out of her drive.  She has issues with the right foot and the left hip being weak, she currently uses a walker but was using a SPC at times int he past year or two, she reports that over the past few weeks she has had multiple small stumbles but no pain.  Her TUG and have been up and down, down recently due to the weather and not being able to walk or get to therapy.  Eval TUG was 21 seconds , down to 15 seconds with PT and then now at 19 seconds due to not being seen in PT due to weather.  3 minute walk test has improved from 21- feet to 300 feet.  HE MD was recently concerned about her walking and sent a  new prescription.    Her TUG went from 21 seconds at eval to 15 seconds prior to the wrist surgery to now at 17 seconds, her 2 minute walk test improved from 210 feet to 300 feet. Patient very fearful with SPC and or with balance activities.  Cues for anterior weight shifts needed with sit to stands. All interventions completed well without reports of increase pain,   Patient is a 89 y.o. female who was seen today for physical therapy evaluation and treatment for LE weakness, poor balance and some LBP.   She reports that over the past few years she has had more and more difficulty walking, she thought she had a pinched nerve but MD ruled this out.  Right ankle is very weak, left knee and hip are weak, she is at a very high risk for falls with Berg balance test score being 23/56.  She has to hold onto things to keep her balance  OBJECTIVE IMPAIRMENTS: Abnormal gait, cardiopulmonary status limiting activity, decreased activity tolerance, decreased balance, decreased coordination, decreased endurance, decreased mobility, difficulty walking, decreased ROM, decreased strength, increased muscle spasms, impaired flexibility, improper body mechanics, postural dysfunction, and pain.   REHAB POTENTIAL: Good  CLINICAL DECISION MAKING: Stable/uncomplicated  EVALUATION COMPLEXITY: Low   GOALS: Goals reviewed with patient? Yes  SHORT TERM GOALS: Target date: 02/24/24  Independent with initial hep Baseline: Goal status: met 03/11/24  LONG TERM GOALS: Target date: 04/03/24  Independent with advanced HEP Baseline:  Goal status: met 05/09/24  2.  Decrease TUG time to 16 seconds Baseline:  Goal status: progressing 04/08/24, ongoing 05/09/24  3.  Improve Berg balance score to 42/56 Baseline:  Goal status:progressing 04/08/24, ongoing 05/09/24  4.  Be able to walk 300 feet in Baseline:  Goal status: met 04/08/24  5.  Walk 200 feet with SPC and SBA Baseline:  Goal status: Progressing 02/20/24, Met  03/07/24, is now struggling with this after the holidays and the poor weather  PLAN:  PT FREQUENCY: 1-2x/week  PT DURATION: 12 weeks  PLANNED INTERVENTIONS: 97164- PT Re-evaluation, 97110-Therapeutic exercises, 97530- Therapeutic activity, 97112- Neuromuscular re-education, 97535- Self Care, [id number]- Manual therapy, [phone]- Gait training, [phone]- Electrical stimulation (unattended), Patient/Family education, Balance training, Stair training, Taping, Joint mobilization, Cryotherapy, and Moist heat.  PLAN FOR NEXT SESSION: balance, strength, functional gait, will have to request new visits  OBADIAH OZELL ORN, PT 05/09/2024, 10:12 AM  "

## 2024-05-14 ENCOUNTER — Ambulatory Visit: Admitting: Physical Therapy

## 2024-05-30 ENCOUNTER — Ambulatory Visit: Admitting: Neurology
